# Patient Record
Sex: Male | Born: 1935 | Race: White | Hispanic: No | Marital: Married | Smoking: Former smoker
Health system: Southern US, Community
[De-identification: ages and names within clinical notes are randomized; demographics above are authoritative.]

## PROBLEM LIST (undated history)

## (undated) DIAGNOSIS — Z8719 Personal history of other diseases of the digestive system: Secondary | ICD-10-CM

## (undated) DIAGNOSIS — N189 Chronic kidney disease, unspecified: Secondary | ICD-10-CM

## (undated) DIAGNOSIS — E785 Hyperlipidemia, unspecified: Secondary | ICD-10-CM

## (undated) DIAGNOSIS — I10 Essential (primary) hypertension: Secondary | ICD-10-CM

## (undated) DIAGNOSIS — R609 Edema, unspecified: Secondary | ICD-10-CM

## (undated) DIAGNOSIS — I639 Cerebral infarction, unspecified: Secondary | ICD-10-CM

## (undated) DIAGNOSIS — H9190 Unspecified hearing loss, unspecified ear: Secondary | ICD-10-CM

## (undated) DIAGNOSIS — R011 Cardiac murmur, unspecified: Secondary | ICD-10-CM

## (undated) DIAGNOSIS — I219 Acute myocardial infarction, unspecified: Secondary | ICD-10-CM

## (undated) DIAGNOSIS — I5020 Unspecified systolic (congestive) heart failure: Secondary | ICD-10-CM

## (undated) DIAGNOSIS — F0280 Dementia in other diseases classified elsewhere without behavioral disturbance: Secondary | ICD-10-CM

## (undated) DIAGNOSIS — I2510 Atherosclerotic heart disease of native coronary artery without angina pectoris: Secondary | ICD-10-CM

## (undated) DIAGNOSIS — G459 Transient cerebral ischemic attack, unspecified: Secondary | ICD-10-CM

## (undated) DIAGNOSIS — H409 Unspecified glaucoma: Secondary | ICD-10-CM

## (undated) DIAGNOSIS — D649 Anemia, unspecified: Secondary | ICD-10-CM

## (undated) DIAGNOSIS — K219 Gastro-esophageal reflux disease without esophagitis: Secondary | ICD-10-CM

## (undated) DIAGNOSIS — I340 Nonrheumatic mitral (valve) insufficiency: Secondary | ICD-10-CM

## (undated) DIAGNOSIS — E119 Type 2 diabetes mellitus without complications: Secondary | ICD-10-CM

## (undated) HISTORY — DX: Transient cerebral ischemic attack, unspecified: G45.9

## (undated) HISTORY — DX: Atherosclerotic heart disease of native coronary artery without angina pectoris: I25.10

## (undated) HISTORY — DX: Hyperlipidemia, unspecified: E78.5

## (undated) HISTORY — DX: Chronic kidney disease, unspecified: N18.9

## (undated) HISTORY — DX: Gastro-esophageal reflux disease without esophagitis: K21.9

## (undated) HISTORY — DX: Nonrheumatic mitral (valve) insufficiency: I34.0

## (undated) HISTORY — DX: Cerebral infarction, unspecified: I63.9

## (undated) HISTORY — DX: Type 2 diabetes mellitus without complications: E11.9

## (undated) HISTORY — DX: Unspecified glaucoma: H40.9

## (undated) HISTORY — DX: Acute myocardial infarction, unspecified: I21.9

## (undated) HISTORY — DX: Essential (primary) hypertension: I10

## (undated) HISTORY — DX: Anemia, unspecified: D64.9

## (undated) HISTORY — PX: SKIN CANCER EXCISION: SHX779

---

## 2001-02-13 HISTORY — PX: CORONARY ARTERY BYPASS GRAFT: SHX141

## 2004-11-30 ENCOUNTER — Ambulatory Visit: Payer: Self-pay | Admitting: Cardiovascular Disease

## 2008-02-14 HISTORY — PX: JOINT REPLACEMENT: SHX530

## 2008-06-08 ENCOUNTER — Encounter: Payer: Self-pay | Admitting: Internal Medicine

## 2008-06-13 ENCOUNTER — Encounter: Payer: Self-pay | Admitting: Internal Medicine

## 2008-06-16 ENCOUNTER — Inpatient Hospital Stay: Payer: Self-pay | Admitting: Surgery

## 2009-02-13 DIAGNOSIS — I639 Cerebral infarction, unspecified: Secondary | ICD-10-CM

## 2009-02-13 HISTORY — DX: Cerebral infarction, unspecified: I63.9

## 2009-04-10 ENCOUNTER — Emergency Department: Payer: Self-pay | Admitting: Emergency Medicine

## 2011-02-17 DIAGNOSIS — I25810 Atherosclerosis of coronary artery bypass graft(s) without angina pectoris: Secondary | ICD-10-CM | POA: Diagnosis not present

## 2011-02-17 DIAGNOSIS — I10 Essential (primary) hypertension: Secondary | ICD-10-CM | POA: Diagnosis not present

## 2011-02-17 DIAGNOSIS — E119 Type 2 diabetes mellitus without complications: Secondary | ICD-10-CM | POA: Diagnosis not present

## 2011-02-17 DIAGNOSIS — I2510 Atherosclerotic heart disease of native coronary artery without angina pectoris: Secondary | ICD-10-CM | POA: Diagnosis not present

## 2011-02-21 DIAGNOSIS — E119 Type 2 diabetes mellitus without complications: Secondary | ICD-10-CM | POA: Diagnosis not present

## 2011-02-21 DIAGNOSIS — IMO0001 Reserved for inherently not codable concepts without codable children: Secondary | ICD-10-CM | POA: Diagnosis not present

## 2011-02-21 DIAGNOSIS — N184 Chronic kidney disease, stage 4 (severe): Secondary | ICD-10-CM | POA: Diagnosis not present

## 2011-02-21 DIAGNOSIS — D631 Anemia in chronic kidney disease: Secondary | ICD-10-CM | POA: Diagnosis not present

## 2011-02-21 DIAGNOSIS — N039 Chronic nephritic syndrome with unspecified morphologic changes: Secondary | ICD-10-CM | POA: Diagnosis not present

## 2011-02-21 DIAGNOSIS — I10 Essential (primary) hypertension: Secondary | ICD-10-CM | POA: Diagnosis not present

## 2011-03-27 DIAGNOSIS — I10 Essential (primary) hypertension: Secondary | ICD-10-CM | POA: Diagnosis not present

## 2011-03-27 DIAGNOSIS — E785 Hyperlipidemia, unspecified: Secondary | ICD-10-CM | POA: Diagnosis not present

## 2011-03-27 DIAGNOSIS — E119 Type 2 diabetes mellitus without complications: Secondary | ICD-10-CM | POA: Diagnosis not present

## 2011-04-04 DIAGNOSIS — B351 Tinea unguium: Secondary | ICD-10-CM | POA: Diagnosis not present

## 2011-04-04 DIAGNOSIS — M79609 Pain in unspecified limb: Secondary | ICD-10-CM | POA: Diagnosis not present

## 2011-04-24 DIAGNOSIS — H40009 Preglaucoma, unspecified, unspecified eye: Secondary | ICD-10-CM | POA: Diagnosis not present

## 2011-05-11 DIAGNOSIS — R509 Fever, unspecified: Secondary | ICD-10-CM | POA: Diagnosis not present

## 2011-05-11 DIAGNOSIS — J111 Influenza due to unidentified influenza virus with other respiratory manifestations: Secondary | ICD-10-CM | POA: Diagnosis not present

## 2011-06-15 DIAGNOSIS — I10 Essential (primary) hypertension: Secondary | ICD-10-CM | POA: Diagnosis not present

## 2011-06-15 DIAGNOSIS — IMO0001 Reserved for inherently not codable concepts without codable children: Secondary | ICD-10-CM | POA: Diagnosis not present

## 2011-06-15 DIAGNOSIS — E119 Type 2 diabetes mellitus without complications: Secondary | ICD-10-CM | POA: Diagnosis not present

## 2011-06-15 DIAGNOSIS — N184 Chronic kidney disease, stage 4 (severe): Secondary | ICD-10-CM | POA: Diagnosis not present

## 2011-06-20 DIAGNOSIS — N039 Chronic nephritic syndrome with unspecified morphologic changes: Secondary | ICD-10-CM | POA: Diagnosis not present

## 2011-06-20 DIAGNOSIS — I10 Essential (primary) hypertension: Secondary | ICD-10-CM | POA: Diagnosis not present

## 2011-06-20 DIAGNOSIS — D631 Anemia in chronic kidney disease: Secondary | ICD-10-CM | POA: Diagnosis not present

## 2011-06-20 DIAGNOSIS — R609 Edema, unspecified: Secondary | ICD-10-CM | POA: Diagnosis not present

## 2011-06-20 DIAGNOSIS — N184 Chronic kidney disease, stage 4 (severe): Secondary | ICD-10-CM | POA: Diagnosis not present

## 2011-07-11 DIAGNOSIS — I2510 Atherosclerotic heart disease of native coronary artery without angina pectoris: Secondary | ICD-10-CM | POA: Diagnosis not present

## 2011-07-11 DIAGNOSIS — I25810 Atherosclerosis of coronary artery bypass graft(s) without angina pectoris: Secondary | ICD-10-CM | POA: Diagnosis not present

## 2011-07-11 DIAGNOSIS — E785 Hyperlipidemia, unspecified: Secondary | ICD-10-CM | POA: Diagnosis not present

## 2011-07-11 DIAGNOSIS — E119 Type 2 diabetes mellitus without complications: Secondary | ICD-10-CM | POA: Diagnosis not present

## 2011-08-02 DIAGNOSIS — R072 Precordial pain: Secondary | ICD-10-CM | POA: Diagnosis not present

## 2011-08-03 DIAGNOSIS — E785 Hyperlipidemia, unspecified: Secondary | ICD-10-CM | POA: Diagnosis not present

## 2011-08-03 DIAGNOSIS — I10 Essential (primary) hypertension: Secondary | ICD-10-CM | POA: Diagnosis not present

## 2011-08-03 DIAGNOSIS — I25810 Atherosclerosis of coronary artery bypass graft(s) without angina pectoris: Secondary | ICD-10-CM | POA: Diagnosis not present

## 2011-08-03 DIAGNOSIS — I2510 Atherosclerotic heart disease of native coronary artery without angina pectoris: Secondary | ICD-10-CM | POA: Diagnosis not present

## 2011-09-28 DIAGNOSIS — E119 Type 2 diabetes mellitus without complications: Secondary | ICD-10-CM | POA: Diagnosis not present

## 2011-09-28 DIAGNOSIS — Z0000 Encounter for general adult medical examination without abnormal findings: Secondary | ICD-10-CM | POA: Diagnosis not present

## 2011-09-28 DIAGNOSIS — E785 Hyperlipidemia, unspecified: Secondary | ICD-10-CM | POA: Diagnosis not present

## 2011-09-28 DIAGNOSIS — N183 Chronic kidney disease, stage 3 unspecified: Secondary | ICD-10-CM | POA: Diagnosis not present

## 2011-09-28 DIAGNOSIS — I10 Essential (primary) hypertension: Secondary | ICD-10-CM | POA: Diagnosis not present

## 2011-09-28 DIAGNOSIS — Z125 Encounter for screening for malignant neoplasm of prostate: Secondary | ICD-10-CM | POA: Diagnosis not present

## 2011-10-02 DIAGNOSIS — G459 Transient cerebral ischemic attack, unspecified: Secondary | ICD-10-CM | POA: Diagnosis not present

## 2011-10-02 DIAGNOSIS — E1142 Type 2 diabetes mellitus with diabetic polyneuropathy: Secondary | ICD-10-CM | POA: Diagnosis not present

## 2011-10-02 DIAGNOSIS — E1149 Type 2 diabetes mellitus with other diabetic neurological complication: Secondary | ICD-10-CM | POA: Diagnosis not present

## 2011-10-18 DIAGNOSIS — I10 Essential (primary) hypertension: Secondary | ICD-10-CM | POA: Diagnosis not present

## 2011-10-18 DIAGNOSIS — R609 Edema, unspecified: Secondary | ICD-10-CM | POA: Diagnosis not present

## 2011-10-18 DIAGNOSIS — N184 Chronic kidney disease, stage 4 (severe): Secondary | ICD-10-CM | POA: Diagnosis not present

## 2011-10-18 DIAGNOSIS — E119 Type 2 diabetes mellitus without complications: Secondary | ICD-10-CM | POA: Diagnosis not present

## 2011-11-15 DIAGNOSIS — H40009 Preglaucoma, unspecified, unspecified eye: Secondary | ICD-10-CM | POA: Diagnosis not present

## 2011-12-28 DIAGNOSIS — Z23 Encounter for immunization: Secondary | ICD-10-CM | POA: Diagnosis not present

## 2011-12-28 DIAGNOSIS — E785 Hyperlipidemia, unspecified: Secondary | ICD-10-CM | POA: Diagnosis not present

## 2011-12-28 DIAGNOSIS — I10 Essential (primary) hypertension: Secondary | ICD-10-CM | POA: Diagnosis not present

## 2011-12-28 DIAGNOSIS — E119 Type 2 diabetes mellitus without complications: Secondary | ICD-10-CM | POA: Diagnosis not present

## 2012-01-04 DIAGNOSIS — I10 Essential (primary) hypertension: Secondary | ICD-10-CM | POA: Diagnosis not present

## 2012-01-04 DIAGNOSIS — I2510 Atherosclerotic heart disease of native coronary artery without angina pectoris: Secondary | ICD-10-CM | POA: Diagnosis not present

## 2012-01-04 DIAGNOSIS — I25810 Atherosclerosis of coronary artery bypass graft(s) without angina pectoris: Secondary | ICD-10-CM | POA: Diagnosis not present

## 2012-01-04 DIAGNOSIS — R609 Edema, unspecified: Secondary | ICD-10-CM | POA: Diagnosis not present

## 2012-02-26 DIAGNOSIS — M79609 Pain in unspecified limb: Secondary | ICD-10-CM | POA: Diagnosis not present

## 2012-02-26 DIAGNOSIS — B351 Tinea unguium: Secondary | ICD-10-CM | POA: Diagnosis not present

## 2012-03-25 DIAGNOSIS — E119 Type 2 diabetes mellitus without complications: Secondary | ICD-10-CM | POA: Diagnosis not present

## 2012-03-25 DIAGNOSIS — I10 Essential (primary) hypertension: Secondary | ICD-10-CM | POA: Diagnosis not present

## 2012-03-25 DIAGNOSIS — E785 Hyperlipidemia, unspecified: Secondary | ICD-10-CM | POA: Diagnosis not present

## 2012-04-15 DIAGNOSIS — N184 Chronic kidney disease, stage 4 (severe): Secondary | ICD-10-CM | POA: Diagnosis not present

## 2012-04-15 DIAGNOSIS — E119 Type 2 diabetes mellitus without complications: Secondary | ICD-10-CM | POA: Diagnosis not present

## 2012-04-17 DIAGNOSIS — N184 Chronic kidney disease, stage 4 (severe): Secondary | ICD-10-CM | POA: Diagnosis not present

## 2012-04-17 DIAGNOSIS — E119 Type 2 diabetes mellitus without complications: Secondary | ICD-10-CM | POA: Diagnosis not present

## 2012-04-17 DIAGNOSIS — I10 Essential (primary) hypertension: Secondary | ICD-10-CM | POA: Diagnosis not present

## 2012-04-17 DIAGNOSIS — R609 Edema, unspecified: Secondary | ICD-10-CM | POA: Diagnosis not present

## 2012-05-03 DIAGNOSIS — I25810 Atherosclerosis of coronary artery bypass graft(s) without angina pectoris: Secondary | ICD-10-CM | POA: Diagnosis not present

## 2012-05-03 DIAGNOSIS — E785 Hyperlipidemia, unspecified: Secondary | ICD-10-CM | POA: Diagnosis not present

## 2012-05-03 DIAGNOSIS — R079 Chest pain, unspecified: Secondary | ICD-10-CM | POA: Diagnosis not present

## 2012-05-03 DIAGNOSIS — I209 Angina pectoris, unspecified: Secondary | ICD-10-CM | POA: Diagnosis not present

## 2012-05-07 DIAGNOSIS — R072 Precordial pain: Secondary | ICD-10-CM | POA: Diagnosis not present

## 2012-06-17 DIAGNOSIS — H40009 Preglaucoma, unspecified, unspecified eye: Secondary | ICD-10-CM | POA: Diagnosis not present

## 2012-06-25 DIAGNOSIS — I10 Essential (primary) hypertension: Secondary | ICD-10-CM | POA: Diagnosis not present

## 2012-06-25 DIAGNOSIS — E785 Hyperlipidemia, unspecified: Secondary | ICD-10-CM | POA: Diagnosis not present

## 2012-06-25 DIAGNOSIS — E119 Type 2 diabetes mellitus without complications: Secondary | ICD-10-CM | POA: Diagnosis not present

## 2012-07-22 DIAGNOSIS — E119 Type 2 diabetes mellitus without complications: Secondary | ICD-10-CM | POA: Diagnosis not present

## 2012-07-22 DIAGNOSIS — N184 Chronic kidney disease, stage 4 (severe): Secondary | ICD-10-CM | POA: Diagnosis not present

## 2012-07-24 DIAGNOSIS — R609 Edema, unspecified: Secondary | ICD-10-CM | POA: Diagnosis not present

## 2012-07-24 DIAGNOSIS — N184 Chronic kidney disease, stage 4 (severe): Secondary | ICD-10-CM | POA: Diagnosis not present

## 2012-07-24 DIAGNOSIS — I10 Essential (primary) hypertension: Secondary | ICD-10-CM | POA: Diagnosis not present

## 2012-07-24 DIAGNOSIS — E119 Type 2 diabetes mellitus without complications: Secondary | ICD-10-CM | POA: Diagnosis not present

## 2012-07-25 DIAGNOSIS — E785 Hyperlipidemia, unspecified: Secondary | ICD-10-CM | POA: Diagnosis not present

## 2012-07-25 DIAGNOSIS — I10 Essential (primary) hypertension: Secondary | ICD-10-CM | POA: Diagnosis not present

## 2012-07-25 DIAGNOSIS — E119 Type 2 diabetes mellitus without complications: Secondary | ICD-10-CM | POA: Diagnosis not present

## 2012-09-06 DIAGNOSIS — I2510 Atherosclerotic heart disease of native coronary artery without angina pectoris: Secondary | ICD-10-CM | POA: Diagnosis not present

## 2012-09-06 DIAGNOSIS — E119 Type 2 diabetes mellitus without complications: Secondary | ICD-10-CM | POA: Diagnosis not present

## 2012-09-06 DIAGNOSIS — R609 Edema, unspecified: Secondary | ICD-10-CM | POA: Diagnosis not present

## 2012-09-06 DIAGNOSIS — I10 Essential (primary) hypertension: Secondary | ICD-10-CM | POA: Diagnosis not present

## 2012-10-03 DIAGNOSIS — Z7901 Long term (current) use of anticoagulants: Secondary | ICD-10-CM | POA: Diagnosis not present

## 2012-10-03 DIAGNOSIS — D649 Anemia, unspecified: Secondary | ICD-10-CM | POA: Diagnosis not present

## 2012-10-08 ENCOUNTER — Ambulatory Visit: Payer: Self-pay | Admitting: Cardiovascular Disease

## 2012-10-08 DIAGNOSIS — I10 Essential (primary) hypertension: Secondary | ICD-10-CM | POA: Diagnosis not present

## 2012-10-08 DIAGNOSIS — R609 Edema, unspecified: Secondary | ICD-10-CM | POA: Diagnosis not present

## 2012-10-08 DIAGNOSIS — R079 Chest pain, unspecified: Secondary | ICD-10-CM | POA: Diagnosis not present

## 2012-10-08 DIAGNOSIS — I742 Embolism and thrombosis of arteries of the upper extremities: Secondary | ICD-10-CM | POA: Diagnosis not present

## 2012-10-08 DIAGNOSIS — R0609 Other forms of dyspnea: Secondary | ICD-10-CM | POA: Diagnosis not present

## 2012-10-08 DIAGNOSIS — D151 Benign neoplasm of heart: Secondary | ICD-10-CM | POA: Diagnosis not present

## 2012-10-08 DIAGNOSIS — Z7902 Long term (current) use of antithrombotics/antiplatelets: Secondary | ICD-10-CM | POA: Diagnosis not present

## 2012-10-08 DIAGNOSIS — Z951 Presence of aortocoronary bypass graft: Secondary | ICD-10-CM | POA: Diagnosis not present

## 2012-10-08 DIAGNOSIS — Z79899 Other long term (current) drug therapy: Secondary | ICD-10-CM | POA: Diagnosis not present

## 2012-10-08 DIAGNOSIS — E119 Type 2 diabetes mellitus without complications: Secondary | ICD-10-CM | POA: Diagnosis not present

## 2012-10-08 DIAGNOSIS — I25810 Atherosclerosis of coronary artery bypass graft(s) without angina pectoris: Secondary | ICD-10-CM | POA: Diagnosis not present

## 2012-10-08 DIAGNOSIS — I209 Angina pectoris, unspecified: Secondary | ICD-10-CM | POA: Diagnosis not present

## 2012-10-08 DIAGNOSIS — E785 Hyperlipidemia, unspecified: Secondary | ICD-10-CM | POA: Diagnosis not present

## 2012-10-08 DIAGNOSIS — I080 Rheumatic disorders of both mitral and aortic valves: Secondary | ICD-10-CM | POA: Diagnosis not present

## 2012-10-08 DIAGNOSIS — R0989 Other specified symptoms and signs involving the circulatory and respiratory systems: Secondary | ICD-10-CM | POA: Diagnosis not present

## 2012-10-15 DIAGNOSIS — I10 Essential (primary) hypertension: Secondary | ICD-10-CM | POA: Diagnosis not present

## 2012-10-15 DIAGNOSIS — I25810 Atherosclerosis of coronary artery bypass graft(s) without angina pectoris: Secondary | ICD-10-CM | POA: Diagnosis not present

## 2012-10-15 DIAGNOSIS — I2510 Atherosclerotic heart disease of native coronary artery without angina pectoris: Secondary | ICD-10-CM | POA: Diagnosis not present

## 2012-10-15 DIAGNOSIS — R079 Chest pain, unspecified: Secondary | ICD-10-CM | POA: Diagnosis not present

## 2012-10-24 DIAGNOSIS — I10 Essential (primary) hypertension: Secondary | ICD-10-CM | POA: Diagnosis not present

## 2012-10-24 DIAGNOSIS — E785 Hyperlipidemia, unspecified: Secondary | ICD-10-CM | POA: Diagnosis not present

## 2012-10-24 DIAGNOSIS — Z125 Encounter for screening for malignant neoplasm of prostate: Secondary | ICD-10-CM | POA: Diagnosis not present

## 2012-10-24 DIAGNOSIS — N183 Chronic kidney disease, stage 3 unspecified: Secondary | ICD-10-CM | POA: Diagnosis not present

## 2012-10-24 DIAGNOSIS — Z23 Encounter for immunization: Secondary | ICD-10-CM | POA: Diagnosis not present

## 2012-10-24 DIAGNOSIS — N189 Chronic kidney disease, unspecified: Secondary | ICD-10-CM | POA: Diagnosis not present

## 2012-10-24 DIAGNOSIS — Z0000 Encounter for general adult medical examination without abnormal findings: Secondary | ICD-10-CM | POA: Diagnosis not present

## 2012-10-24 DIAGNOSIS — D631 Anemia in chronic kidney disease: Secondary | ICD-10-CM | POA: Diagnosis not present

## 2012-10-24 DIAGNOSIS — E119 Type 2 diabetes mellitus without complications: Secondary | ICD-10-CM | POA: Diagnosis not present

## 2012-11-11 DIAGNOSIS — I10 Essential (primary) hypertension: Secondary | ICD-10-CM | POA: Diagnosis not present

## 2012-11-11 DIAGNOSIS — N184 Chronic kidney disease, stage 4 (severe): Secondary | ICD-10-CM | POA: Diagnosis not present

## 2012-11-11 DIAGNOSIS — E118 Type 2 diabetes mellitus with unspecified complications: Secondary | ICD-10-CM | POA: Diagnosis not present

## 2012-11-11 DIAGNOSIS — R609 Edema, unspecified: Secondary | ICD-10-CM | POA: Diagnosis not present

## 2012-11-14 DIAGNOSIS — I10 Essential (primary) hypertension: Secondary | ICD-10-CM | POA: Diagnosis not present

## 2012-11-14 DIAGNOSIS — N184 Chronic kidney disease, stage 4 (severe): Secondary | ICD-10-CM | POA: Diagnosis not present

## 2012-11-14 DIAGNOSIS — R319 Hematuria, unspecified: Secondary | ICD-10-CM | POA: Diagnosis not present

## 2012-11-14 DIAGNOSIS — R609 Edema, unspecified: Secondary | ICD-10-CM | POA: Diagnosis not present

## 2012-11-14 DIAGNOSIS — IMO0001 Reserved for inherently not codable concepts without codable children: Secondary | ICD-10-CM | POA: Diagnosis not present

## 2012-11-14 DIAGNOSIS — E119 Type 2 diabetes mellitus without complications: Secondary | ICD-10-CM | POA: Diagnosis not present

## 2012-11-15 DIAGNOSIS — I2510 Atherosclerotic heart disease of native coronary artery without angina pectoris: Secondary | ICD-10-CM | POA: Diagnosis not present

## 2012-11-15 DIAGNOSIS — I25810 Atherosclerosis of coronary artery bypass graft(s) without angina pectoris: Secondary | ICD-10-CM | POA: Diagnosis not present

## 2012-11-15 DIAGNOSIS — I10 Essential (primary) hypertension: Secondary | ICD-10-CM | POA: Diagnosis not present

## 2012-11-25 DIAGNOSIS — G609 Hereditary and idiopathic neuropathy, unspecified: Secondary | ICD-10-CM | POA: Diagnosis not present

## 2012-12-16 DIAGNOSIS — H40009 Preglaucoma, unspecified, unspecified eye: Secondary | ICD-10-CM | POA: Diagnosis not present

## 2013-01-27 DIAGNOSIS — J019 Acute sinusitis, unspecified: Secondary | ICD-10-CM | POA: Diagnosis not present

## 2013-01-27 DIAGNOSIS — E7800 Pure hypercholesterolemia, unspecified: Secondary | ICD-10-CM | POA: Diagnosis not present

## 2013-01-27 DIAGNOSIS — E119 Type 2 diabetes mellitus without complications: Secondary | ICD-10-CM | POA: Diagnosis not present

## 2013-01-27 DIAGNOSIS — I10 Essential (primary) hypertension: Secondary | ICD-10-CM | POA: Diagnosis not present

## 2013-02-24 DIAGNOSIS — E785 Hyperlipidemia, unspecified: Secondary | ICD-10-CM | POA: Diagnosis not present

## 2013-02-24 DIAGNOSIS — I25810 Atherosclerosis of coronary artery bypass graft(s) without angina pectoris: Secondary | ICD-10-CM | POA: Diagnosis not present

## 2013-02-24 DIAGNOSIS — I2510 Atherosclerotic heart disease of native coronary artery without angina pectoris: Secondary | ICD-10-CM | POA: Diagnosis not present

## 2013-02-24 DIAGNOSIS — I10 Essential (primary) hypertension: Secondary | ICD-10-CM | POA: Diagnosis not present

## 2013-02-27 DIAGNOSIS — R072 Precordial pain: Secondary | ICD-10-CM | POA: Diagnosis not present

## 2013-03-03 DIAGNOSIS — I209 Angina pectoris, unspecified: Secondary | ICD-10-CM | POA: Diagnosis not present

## 2013-03-03 DIAGNOSIS — I2510 Atherosclerotic heart disease of native coronary artery without angina pectoris: Secondary | ICD-10-CM | POA: Diagnosis not present

## 2013-03-03 DIAGNOSIS — I10 Essential (primary) hypertension: Secondary | ICD-10-CM | POA: Diagnosis not present

## 2013-03-03 DIAGNOSIS — E785 Hyperlipidemia, unspecified: Secondary | ICD-10-CM | POA: Diagnosis not present

## 2013-03-03 DIAGNOSIS — I25810 Atherosclerosis of coronary artery bypass graft(s) without angina pectoris: Secondary | ICD-10-CM | POA: Diagnosis not present

## 2013-03-07 DIAGNOSIS — B351 Tinea unguium: Secondary | ICD-10-CM | POA: Diagnosis not present

## 2013-03-07 DIAGNOSIS — M79609 Pain in unspecified limb: Secondary | ICD-10-CM | POA: Diagnosis not present

## 2013-03-10 DIAGNOSIS — E118 Type 2 diabetes mellitus with unspecified complications: Secondary | ICD-10-CM | POA: Diagnosis not present

## 2013-03-10 DIAGNOSIS — R609 Edema, unspecified: Secondary | ICD-10-CM | POA: Diagnosis not present

## 2013-03-10 DIAGNOSIS — I10 Essential (primary) hypertension: Secondary | ICD-10-CM | POA: Diagnosis not present

## 2013-03-10 DIAGNOSIS — N184 Chronic kidney disease, stage 4 (severe): Secondary | ICD-10-CM | POA: Diagnosis not present

## 2013-03-11 DIAGNOSIS — L299 Pruritus, unspecified: Secondary | ICD-10-CM | POA: Diagnosis not present

## 2013-03-11 DIAGNOSIS — L280 Lichen simplex chronicus: Secondary | ICD-10-CM | POA: Diagnosis not present

## 2013-03-17 DIAGNOSIS — R609 Edema, unspecified: Secondary | ICD-10-CM | POA: Diagnosis not present

## 2013-03-17 DIAGNOSIS — N184 Chronic kidney disease, stage 4 (severe): Secondary | ICD-10-CM | POA: Diagnosis not present

## 2013-03-17 DIAGNOSIS — E118 Type 2 diabetes mellitus with unspecified complications: Secondary | ICD-10-CM | POA: Diagnosis not present

## 2013-03-17 DIAGNOSIS — I10 Essential (primary) hypertension: Secondary | ICD-10-CM | POA: Diagnosis not present

## 2013-04-30 DIAGNOSIS — I10 Essential (primary) hypertension: Secondary | ICD-10-CM | POA: Diagnosis not present

## 2013-04-30 DIAGNOSIS — E785 Hyperlipidemia, unspecified: Secondary | ICD-10-CM | POA: Diagnosis not present

## 2013-04-30 DIAGNOSIS — E119 Type 2 diabetes mellitus without complications: Secondary | ICD-10-CM | POA: Diagnosis not present

## 2013-06-16 DIAGNOSIS — H40009 Preglaucoma, unspecified, unspecified eye: Secondary | ICD-10-CM | POA: Diagnosis not present

## 2013-06-23 DIAGNOSIS — E118 Type 2 diabetes mellitus with unspecified complications: Secondary | ICD-10-CM | POA: Diagnosis not present

## 2013-06-23 DIAGNOSIS — N184 Chronic kidney disease, stage 4 (severe): Secondary | ICD-10-CM | POA: Diagnosis not present

## 2013-06-23 DIAGNOSIS — R609 Edema, unspecified: Secondary | ICD-10-CM | POA: Diagnosis not present

## 2013-06-23 DIAGNOSIS — I10 Essential (primary) hypertension: Secondary | ICD-10-CM | POA: Diagnosis not present

## 2013-06-25 DIAGNOSIS — I10 Essential (primary) hypertension: Secondary | ICD-10-CM | POA: Diagnosis not present

## 2013-06-25 DIAGNOSIS — R609 Edema, unspecified: Secondary | ICD-10-CM | POA: Diagnosis not present

## 2013-06-25 DIAGNOSIS — N184 Chronic kidney disease, stage 4 (severe): Secondary | ICD-10-CM | POA: Diagnosis not present

## 2013-06-25 DIAGNOSIS — E875 Hyperkalemia: Secondary | ICD-10-CM | POA: Diagnosis not present

## 2013-07-01 DIAGNOSIS — I25810 Atherosclerosis of coronary artery bypass graft(s) without angina pectoris: Secondary | ICD-10-CM | POA: Diagnosis not present

## 2013-07-01 DIAGNOSIS — I2510 Atherosclerotic heart disease of native coronary artery without angina pectoris: Secondary | ICD-10-CM | POA: Diagnosis not present

## 2013-07-01 DIAGNOSIS — E785 Hyperlipidemia, unspecified: Secondary | ICD-10-CM | POA: Diagnosis not present

## 2013-07-01 DIAGNOSIS — I10 Essential (primary) hypertension: Secondary | ICD-10-CM | POA: Diagnosis not present

## 2013-07-24 DIAGNOSIS — I200 Unstable angina: Secondary | ICD-10-CM | POA: Diagnosis not present

## 2013-07-24 DIAGNOSIS — I129 Hypertensive chronic kidney disease with stage 1 through stage 4 chronic kidney disease, or unspecified chronic kidney disease: Secondary | ICD-10-CM | POA: Diagnosis not present

## 2013-07-24 DIAGNOSIS — I214 Non-ST elevation (NSTEMI) myocardial infarction: Secondary | ICD-10-CM | POA: Diagnosis not present

## 2013-07-24 DIAGNOSIS — Z87891 Personal history of nicotine dependence: Secondary | ICD-10-CM | POA: Diagnosis not present

## 2013-07-24 DIAGNOSIS — I5031 Acute diastolic (congestive) heart failure: Secondary | ICD-10-CM | POA: Diagnosis present

## 2013-07-24 DIAGNOSIS — D649 Anemia, unspecified: Secondary | ICD-10-CM | POA: Diagnosis present

## 2013-07-24 DIAGNOSIS — I509 Heart failure, unspecified: Secondary | ICD-10-CM | POA: Diagnosis present

## 2013-07-24 DIAGNOSIS — I2510 Atherosclerotic heart disease of native coronary artery without angina pectoris: Secondary | ICD-10-CM | POA: Diagnosis not present

## 2013-07-24 DIAGNOSIS — Z951 Presence of aortocoronary bypass graft: Secondary | ICD-10-CM | POA: Diagnosis not present

## 2013-07-24 DIAGNOSIS — E119 Type 2 diabetes mellitus without complications: Secondary | ICD-10-CM | POA: Diagnosis present

## 2013-07-24 DIAGNOSIS — R079 Chest pain, unspecified: Secondary | ICD-10-CM | POA: Diagnosis not present

## 2013-07-24 DIAGNOSIS — N184 Chronic kidney disease, stage 4 (severe): Secondary | ICD-10-CM | POA: Diagnosis not present

## 2013-07-24 DIAGNOSIS — I25810 Atherosclerosis of coronary artery bypass graft(s) without angina pectoris: Secondary | ICD-10-CM | POA: Diagnosis not present

## 2013-07-24 DIAGNOSIS — R072 Precordial pain: Secondary | ICD-10-CM | POA: Diagnosis not present

## 2013-07-24 DIAGNOSIS — E785 Hyperlipidemia, unspecified: Secondary | ICD-10-CM | POA: Diagnosis present

## 2013-07-28 DIAGNOSIS — R0602 Shortness of breath: Secondary | ICD-10-CM | POA: Diagnosis not present

## 2013-07-28 DIAGNOSIS — I10 Essential (primary) hypertension: Secondary | ICD-10-CM | POA: Diagnosis not present

## 2013-07-28 DIAGNOSIS — I25810 Atherosclerosis of coronary artery bypass graft(s) without angina pectoris: Secondary | ICD-10-CM | POA: Diagnosis not present

## 2013-07-28 DIAGNOSIS — E785 Hyperlipidemia, unspecified: Secondary | ICD-10-CM | POA: Diagnosis not present

## 2013-07-28 DIAGNOSIS — I2510 Atherosclerotic heart disease of native coronary artery without angina pectoris: Secondary | ICD-10-CM | POA: Diagnosis not present

## 2013-07-30 DIAGNOSIS — I2510 Atherosclerotic heart disease of native coronary artery without angina pectoris: Secondary | ICD-10-CM | POA: Diagnosis not present

## 2013-07-30 DIAGNOSIS — N184 Chronic kidney disease, stage 4 (severe): Secondary | ICD-10-CM | POA: Diagnosis not present

## 2013-07-30 DIAGNOSIS — R609 Edema, unspecified: Secondary | ICD-10-CM | POA: Diagnosis not present

## 2013-07-30 DIAGNOSIS — I10 Essential (primary) hypertension: Secondary | ICD-10-CM | POA: Diagnosis not present

## 2013-07-30 DIAGNOSIS — R0602 Shortness of breath: Secondary | ICD-10-CM | POA: Diagnosis not present

## 2013-07-30 DIAGNOSIS — E118 Type 2 diabetes mellitus with unspecified complications: Secondary | ICD-10-CM | POA: Diagnosis not present

## 2013-07-31 DIAGNOSIS — E119 Type 2 diabetes mellitus without complications: Secondary | ICD-10-CM | POA: Diagnosis not present

## 2013-07-31 DIAGNOSIS — E7800 Pure hypercholesterolemia, unspecified: Secondary | ICD-10-CM | POA: Diagnosis not present

## 2013-07-31 DIAGNOSIS — I10 Essential (primary) hypertension: Secondary | ICD-10-CM | POA: Diagnosis not present

## 2013-08-22 DIAGNOSIS — I2510 Atherosclerotic heart disease of native coronary artery without angina pectoris: Secondary | ICD-10-CM | POA: Diagnosis not present

## 2013-08-22 DIAGNOSIS — I10 Essential (primary) hypertension: Secondary | ICD-10-CM | POA: Diagnosis not present

## 2013-08-22 DIAGNOSIS — E785 Hyperlipidemia, unspecified: Secondary | ICD-10-CM | POA: Diagnosis not present

## 2013-08-22 DIAGNOSIS — I059 Rheumatic mitral valve disease, unspecified: Secondary | ICD-10-CM | POA: Diagnosis not present

## 2013-08-22 DIAGNOSIS — I25810 Atherosclerosis of coronary artery bypass graft(s) without angina pectoris: Secondary | ICD-10-CM | POA: Diagnosis not present

## 2013-09-03 DIAGNOSIS — E119 Type 2 diabetes mellitus without complications: Secondary | ICD-10-CM | POA: Diagnosis not present

## 2013-09-03 DIAGNOSIS — E7800 Pure hypercholesterolemia, unspecified: Secondary | ICD-10-CM | POA: Diagnosis not present

## 2013-09-03 DIAGNOSIS — I10 Essential (primary) hypertension: Secondary | ICD-10-CM | POA: Diagnosis not present

## 2013-09-03 DIAGNOSIS — R234 Changes in skin texture: Secondary | ICD-10-CM | POA: Diagnosis not present

## 2013-09-09 DIAGNOSIS — R609 Edema, unspecified: Secondary | ICD-10-CM | POA: Diagnosis not present

## 2013-09-09 DIAGNOSIS — I10 Essential (primary) hypertension: Secondary | ICD-10-CM | POA: Diagnosis not present

## 2013-09-09 DIAGNOSIS — E118 Type 2 diabetes mellitus with unspecified complications: Secondary | ICD-10-CM | POA: Diagnosis not present

## 2013-09-09 DIAGNOSIS — N184 Chronic kidney disease, stage 4 (severe): Secondary | ICD-10-CM | POA: Diagnosis not present

## 2013-09-15 DIAGNOSIS — R609 Edema, unspecified: Secondary | ICD-10-CM | POA: Diagnosis not present

## 2013-09-15 DIAGNOSIS — E875 Hyperkalemia: Secondary | ICD-10-CM | POA: Diagnosis not present

## 2013-09-15 DIAGNOSIS — N184 Chronic kidney disease, stage 4 (severe): Secondary | ICD-10-CM | POA: Diagnosis not present

## 2013-09-15 DIAGNOSIS — I10 Essential (primary) hypertension: Secondary | ICD-10-CM | POA: Diagnosis not present

## 2013-10-23 DIAGNOSIS — I059 Rheumatic mitral valve disease, unspecified: Secondary | ICD-10-CM | POA: Diagnosis not present

## 2013-10-23 DIAGNOSIS — I10 Essential (primary) hypertension: Secondary | ICD-10-CM | POA: Diagnosis not present

## 2013-10-23 DIAGNOSIS — E785 Hyperlipidemia, unspecified: Secondary | ICD-10-CM | POA: Diagnosis not present

## 2013-10-23 DIAGNOSIS — I25810 Atherosclerosis of coronary artery bypass graft(s) without angina pectoris: Secondary | ICD-10-CM | POA: Diagnosis not present

## 2013-10-23 DIAGNOSIS — I2510 Atherosclerotic heart disease of native coronary artery without angina pectoris: Secondary | ICD-10-CM | POA: Diagnosis not present

## 2013-11-12 DIAGNOSIS — Z23 Encounter for immunization: Secondary | ICD-10-CM | POA: Diagnosis not present

## 2013-11-12 DIAGNOSIS — Z0000 Encounter for general adult medical examination without abnormal findings: Secondary | ICD-10-CM | POA: Diagnosis not present

## 2013-11-12 DIAGNOSIS — E119 Type 2 diabetes mellitus without complications: Secondary | ICD-10-CM | POA: Diagnosis not present

## 2013-11-12 DIAGNOSIS — D631 Anemia in chronic kidney disease: Secondary | ICD-10-CM | POA: Diagnosis not present

## 2013-11-12 DIAGNOSIS — I10 Essential (primary) hypertension: Secondary | ICD-10-CM | POA: Diagnosis not present

## 2013-11-12 DIAGNOSIS — N184 Chronic kidney disease, stage 4 (severe): Secondary | ICD-10-CM | POA: Diagnosis not present

## 2013-11-12 DIAGNOSIS — E785 Hyperlipidemia, unspecified: Secondary | ICD-10-CM | POA: Diagnosis not present

## 2013-11-12 DIAGNOSIS — N039 Chronic nephritic syndrome with unspecified morphologic changes: Secondary | ICD-10-CM | POA: Diagnosis not present

## 2013-11-14 DIAGNOSIS — I2510 Atherosclerotic heart disease of native coronary artery without angina pectoris: Secondary | ICD-10-CM | POA: Diagnosis not present

## 2013-11-14 DIAGNOSIS — W19XXXA Unspecified fall, initial encounter: Secondary | ICD-10-CM | POA: Diagnosis not present

## 2013-11-14 DIAGNOSIS — I10 Essential (primary) hypertension: Secondary | ICD-10-CM | POA: Diagnosis not present

## 2013-11-14 DIAGNOSIS — I340 Nonrheumatic mitral (valve) insufficiency: Secondary | ICD-10-CM | POA: Diagnosis not present

## 2013-11-14 DIAGNOSIS — E785 Hyperlipidemia, unspecified: Secondary | ICD-10-CM | POA: Diagnosis not present

## 2013-11-17 DIAGNOSIS — R55 Syncope and collapse: Secondary | ICD-10-CM | POA: Diagnosis not present

## 2013-11-18 DIAGNOSIS — W101XXA Fall (on)(from) sidewalk curb, initial encounter: Secondary | ICD-10-CM | POA: Diagnosis not present

## 2013-11-18 DIAGNOSIS — S065X0A Traumatic subdural hemorrhage without loss of consciousness, initial encounter: Secondary | ICD-10-CM | POA: Diagnosis not present

## 2013-11-18 DIAGNOSIS — I340 Nonrheumatic mitral (valve) insufficiency: Secondary | ICD-10-CM | POA: Diagnosis not present

## 2013-11-18 DIAGNOSIS — I2510 Atherosclerotic heart disease of native coronary artery without angina pectoris: Secondary | ICD-10-CM | POA: Diagnosis not present

## 2013-11-18 DIAGNOSIS — I10 Essential (primary) hypertension: Secondary | ICD-10-CM | POA: Diagnosis not present

## 2013-11-18 DIAGNOSIS — E785 Hyperlipidemia, unspecified: Secondary | ICD-10-CM | POA: Diagnosis not present

## 2013-11-18 DIAGNOSIS — I25810 Atherosclerosis of coronary artery bypass graft(s) without angina pectoris: Secondary | ICD-10-CM | POA: Diagnosis not present

## 2013-11-20 DIAGNOSIS — D72829 Elevated white blood cell count, unspecified: Secondary | ICD-10-CM | POA: Diagnosis not present

## 2013-11-25 DIAGNOSIS — Z8673 Personal history of transient ischemic attack (TIA), and cerebral infarction without residual deficits: Secondary | ICD-10-CM | POA: Diagnosis not present

## 2013-12-15 DIAGNOSIS — I25810 Atherosclerosis of coronary artery bypass graft(s) without angina pectoris: Secondary | ICD-10-CM | POA: Diagnosis not present

## 2013-12-15 DIAGNOSIS — I340 Nonrheumatic mitral (valve) insufficiency: Secondary | ICD-10-CM | POA: Diagnosis not present

## 2013-12-15 DIAGNOSIS — I2510 Atherosclerotic heart disease of native coronary artery without angina pectoris: Secondary | ICD-10-CM | POA: Diagnosis not present

## 2013-12-15 DIAGNOSIS — E785 Hyperlipidemia, unspecified: Secondary | ICD-10-CM | POA: Diagnosis not present

## 2013-12-15 DIAGNOSIS — I10 Essential (primary) hypertension: Secondary | ICD-10-CM | POA: Diagnosis not present

## 2013-12-17 DIAGNOSIS — H40002 Preglaucoma, unspecified, left eye: Secondary | ICD-10-CM | POA: Diagnosis not present

## 2014-01-02 DIAGNOSIS — N184 Chronic kidney disease, stage 4 (severe): Secondary | ICD-10-CM | POA: Diagnosis not present

## 2014-01-02 DIAGNOSIS — R600 Localized edema: Secondary | ICD-10-CM | POA: Diagnosis not present

## 2014-01-02 DIAGNOSIS — I10 Essential (primary) hypertension: Secondary | ICD-10-CM | POA: Diagnosis not present

## 2014-01-02 DIAGNOSIS — E118 Type 2 diabetes mellitus with unspecified complications: Secondary | ICD-10-CM | POA: Diagnosis not present

## 2014-01-14 DIAGNOSIS — E875 Hyperkalemia: Secondary | ICD-10-CM | POA: Diagnosis not present

## 2014-01-14 DIAGNOSIS — I10 Essential (primary) hypertension: Secondary | ICD-10-CM | POA: Diagnosis not present

## 2014-01-14 DIAGNOSIS — N184 Chronic kidney disease, stage 4 (severe): Secondary | ICD-10-CM | POA: Diagnosis not present

## 2014-01-14 DIAGNOSIS — R601 Generalized edema: Secondary | ICD-10-CM | POA: Diagnosis not present

## 2014-02-25 DIAGNOSIS — M79675 Pain in left toe(s): Secondary | ICD-10-CM | POA: Diagnosis not present

## 2014-02-25 DIAGNOSIS — E119 Type 2 diabetes mellitus without complications: Secondary | ICD-10-CM | POA: Diagnosis not present

## 2014-02-25 DIAGNOSIS — B351 Tinea unguium: Secondary | ICD-10-CM | POA: Diagnosis not present

## 2014-02-25 DIAGNOSIS — M79674 Pain in right toe(s): Secondary | ICD-10-CM | POA: Diagnosis not present

## 2014-02-26 DIAGNOSIS — D631 Anemia in chronic kidney disease: Secondary | ICD-10-CM | POA: Diagnosis not present

## 2014-02-26 DIAGNOSIS — E785 Hyperlipidemia, unspecified: Secondary | ICD-10-CM | POA: Diagnosis not present

## 2014-02-26 DIAGNOSIS — I10 Essential (primary) hypertension: Secondary | ICD-10-CM | POA: Diagnosis not present

## 2014-02-26 DIAGNOSIS — E119 Type 2 diabetes mellitus without complications: Secondary | ICD-10-CM | POA: Diagnosis not present

## 2014-02-26 DIAGNOSIS — N184 Chronic kidney disease, stage 4 (severe): Secondary | ICD-10-CM | POA: Diagnosis not present

## 2014-02-26 DIAGNOSIS — I2510 Atherosclerotic heart disease of native coronary artery without angina pectoris: Secondary | ICD-10-CM | POA: Diagnosis not present

## 2014-02-26 DIAGNOSIS — I1310 Hypertensive heart and chronic kidney disease without heart failure, with stage 1 through stage 4 chronic kidney disease, or unspecified chronic kidney disease: Secondary | ICD-10-CM | POA: Diagnosis not present

## 2014-02-26 DIAGNOSIS — I340 Nonrheumatic mitral (valve) insufficiency: Secondary | ICD-10-CM | POA: Diagnosis not present

## 2014-03-05 DIAGNOSIS — S2239XA Fracture of one rib, unspecified side, initial encounter for closed fracture: Secondary | ICD-10-CM | POA: Diagnosis not present

## 2014-03-05 DIAGNOSIS — I340 Nonrheumatic mitral (valve) insufficiency: Secondary | ICD-10-CM | POA: Diagnosis not present

## 2014-03-05 DIAGNOSIS — I2510 Atherosclerotic heart disease of native coronary artery without angina pectoris: Secondary | ICD-10-CM | POA: Diagnosis not present

## 2014-03-05 DIAGNOSIS — I10 Essential (primary) hypertension: Secondary | ICD-10-CM | POA: Diagnosis not present

## 2014-03-05 DIAGNOSIS — I25810 Atherosclerosis of coronary artery bypass graft(s) without angina pectoris: Secondary | ICD-10-CM | POA: Diagnosis not present

## 2014-03-05 DIAGNOSIS — E785 Hyperlipidemia, unspecified: Secondary | ICD-10-CM | POA: Diagnosis not present

## 2014-04-06 DIAGNOSIS — I340 Nonrheumatic mitral (valve) insufficiency: Secondary | ICD-10-CM | POA: Diagnosis not present

## 2014-04-06 DIAGNOSIS — I10 Essential (primary) hypertension: Secondary | ICD-10-CM | POA: Diagnosis not present

## 2014-04-06 DIAGNOSIS — I25810 Atherosclerosis of coronary artery bypass graft(s) without angina pectoris: Secondary | ICD-10-CM | POA: Diagnosis not present

## 2014-04-06 DIAGNOSIS — E785 Hyperlipidemia, unspecified: Secondary | ICD-10-CM | POA: Diagnosis not present

## 2014-04-06 DIAGNOSIS — I2510 Atherosclerotic heart disease of native coronary artery without angina pectoris: Secondary | ICD-10-CM | POA: Diagnosis not present

## 2014-04-14 DIAGNOSIS — R600 Localized edema: Secondary | ICD-10-CM | POA: Diagnosis not present

## 2014-04-14 DIAGNOSIS — E118 Type 2 diabetes mellitus with unspecified complications: Secondary | ICD-10-CM | POA: Diagnosis not present

## 2014-04-14 DIAGNOSIS — I10 Essential (primary) hypertension: Secondary | ICD-10-CM | POA: Diagnosis not present

## 2014-04-14 DIAGNOSIS — N184 Chronic kidney disease, stage 4 (severe): Secondary | ICD-10-CM | POA: Diagnosis not present

## 2014-04-15 DIAGNOSIS — H8111 Benign paroxysmal vertigo, right ear: Secondary | ICD-10-CM | POA: Diagnosis not present

## 2014-04-15 DIAGNOSIS — H8109 Meniere's disease, unspecified ear: Secondary | ICD-10-CM | POA: Diagnosis not present

## 2014-04-15 DIAGNOSIS — H903 Sensorineural hearing loss, bilateral: Secondary | ICD-10-CM | POA: Diagnosis not present

## 2014-04-15 DIAGNOSIS — H8113 Benign paroxysmal vertigo, bilateral: Secondary | ICD-10-CM | POA: Diagnosis not present

## 2014-04-20 DIAGNOSIS — I10 Essential (primary) hypertension: Secondary | ICD-10-CM | POA: Diagnosis not present

## 2014-04-20 DIAGNOSIS — E1122 Type 2 diabetes mellitus with diabetic chronic kidney disease: Secondary | ICD-10-CM | POA: Diagnosis not present

## 2014-04-20 DIAGNOSIS — N184 Chronic kidney disease, stage 4 (severe): Secondary | ICD-10-CM | POA: Diagnosis not present

## 2014-04-22 DIAGNOSIS — H8110 Benign paroxysmal vertigo, unspecified ear: Secondary | ICD-10-CM | POA: Diagnosis not present

## 2014-04-22 DIAGNOSIS — H8109 Meniere's disease, unspecified ear: Secondary | ICD-10-CM | POA: Diagnosis not present

## 2014-04-22 DIAGNOSIS — H8113 Benign paroxysmal vertigo, bilateral: Secondary | ICD-10-CM | POA: Diagnosis not present

## 2014-04-22 DIAGNOSIS — H903 Sensorineural hearing loss, bilateral: Secondary | ICD-10-CM | POA: Diagnosis not present

## 2014-05-28 DIAGNOSIS — E119 Type 2 diabetes mellitus without complications: Secondary | ICD-10-CM | POA: Diagnosis not present

## 2014-05-28 DIAGNOSIS — E785 Hyperlipidemia, unspecified: Secondary | ICD-10-CM | POA: Diagnosis not present

## 2014-05-28 DIAGNOSIS — I10 Essential (primary) hypertension: Secondary | ICD-10-CM | POA: Diagnosis not present

## 2014-06-04 DIAGNOSIS — E785 Hyperlipidemia, unspecified: Secondary | ICD-10-CM | POA: Diagnosis not present

## 2014-06-04 DIAGNOSIS — N184 Chronic kidney disease, stage 4 (severe): Secondary | ICD-10-CM | POA: Diagnosis not present

## 2014-06-04 DIAGNOSIS — I10 Essential (primary) hypertension: Secondary | ICD-10-CM | POA: Diagnosis not present

## 2014-06-04 DIAGNOSIS — E119 Type 2 diabetes mellitus without complications: Secondary | ICD-10-CM | POA: Diagnosis not present

## 2014-06-15 DIAGNOSIS — H40002 Preglaucoma, unspecified, left eye: Secondary | ICD-10-CM | POA: Diagnosis not present

## 2014-06-17 DIAGNOSIS — I10 Essential (primary) hypertension: Secondary | ICD-10-CM | POA: Diagnosis not present

## 2014-06-17 DIAGNOSIS — N184 Chronic kidney disease, stage 4 (severe): Secondary | ICD-10-CM | POA: Diagnosis not present

## 2014-06-24 DIAGNOSIS — H40002 Preglaucoma, unspecified, left eye: Secondary | ICD-10-CM | POA: Diagnosis not present

## 2014-07-27 ENCOUNTER — Telehealth: Payer: Self-pay | Admitting: Family Medicine

## 2014-07-27 NOTE — Telephone Encounter (Signed)
Refill request from [facility], for 90 day supply Januvia 25mg  dialy.

## 2014-07-27 NOTE — Telephone Encounter (Deleted)
E-Fax

## 2014-07-28 MED ORDER — JANUVIA 25 MG PO TABS: 25.0000 mg | ORAL_TABLET | Freq: Every day | ORAL | Status: DC

## 2014-08-10 DIAGNOSIS — I10 Essential (primary) hypertension: Secondary | ICD-10-CM | POA: Diagnosis not present

## 2014-08-10 DIAGNOSIS — I340 Nonrheumatic mitral (valve) insufficiency: Secondary | ICD-10-CM | POA: Diagnosis not present

## 2014-08-10 DIAGNOSIS — I2510 Atherosclerotic heart disease of native coronary artery without angina pectoris: Secondary | ICD-10-CM | POA: Diagnosis not present

## 2014-08-10 DIAGNOSIS — E785 Hyperlipidemia, unspecified: Secondary | ICD-10-CM | POA: Diagnosis not present

## 2014-08-10 DIAGNOSIS — I25810 Atherosclerosis of coronary artery bypass graft(s) without angina pectoris: Secondary | ICD-10-CM | POA: Diagnosis not present

## 2014-08-11 DIAGNOSIS — N184 Chronic kidney disease, stage 4 (severe): Secondary | ICD-10-CM | POA: Diagnosis not present

## 2014-08-11 DIAGNOSIS — E1122 Type 2 diabetes mellitus with diabetic chronic kidney disease: Secondary | ICD-10-CM | POA: Diagnosis not present

## 2014-08-11 DIAGNOSIS — I10 Essential (primary) hypertension: Secondary | ICD-10-CM | POA: Diagnosis not present

## 2014-08-11 DIAGNOSIS — R600 Localized edema: Secondary | ICD-10-CM | POA: Diagnosis not present

## 2014-08-19 DIAGNOSIS — N184 Chronic kidney disease, stage 4 (severe): Secondary | ICD-10-CM | POA: Diagnosis not present

## 2014-08-19 DIAGNOSIS — E1122 Type 2 diabetes mellitus with diabetic chronic kidney disease: Secondary | ICD-10-CM | POA: Diagnosis not present

## 2014-08-19 DIAGNOSIS — I10 Essential (primary) hypertension: Secondary | ICD-10-CM | POA: Diagnosis not present

## 2014-08-19 DIAGNOSIS — R601 Generalized edema: Secondary | ICD-10-CM | POA: Diagnosis not present

## 2014-09-03 ENCOUNTER — Encounter: Payer: Self-pay | Admitting: Family Medicine

## 2014-09-03 ENCOUNTER — Ambulatory Visit (INDEPENDENT_AMBULATORY_CARE_PROVIDER_SITE_OTHER): Payer: Medicare Other | Admitting: Family Medicine

## 2014-09-03 DIAGNOSIS — N184 Chronic kidney disease, stage 4 (severe): Secondary | ICD-10-CM | POA: Diagnosis not present

## 2014-09-03 DIAGNOSIS — I1310 Hypertensive heart and chronic kidney disease without heart failure, with stage 1 through stage 4 chronic kidney disease, or unspecified chronic kidney disease: Secondary | ICD-10-CM | POA: Diagnosis not present

## 2014-09-03 DIAGNOSIS — E1322 Other specified diabetes mellitus with diabetic chronic kidney disease: Secondary | ICD-10-CM

## 2014-09-03 DIAGNOSIS — E119 Type 2 diabetes mellitus without complications: Secondary | ICD-10-CM | POA: Insufficient documentation

## 2014-09-03 DIAGNOSIS — E1122 Type 2 diabetes mellitus with diabetic chronic kidney disease: Secondary | ICD-10-CM

## 2014-09-03 LAB — BAYER DCA HB A1C WAIVED: HB A1C (BAYER DCA - WAIVED): 7 % — ABNORMAL HIGH (ref <7.0)

## 2014-09-03 MED ORDER — JANUVIA 25 MG PO TABS: 25.0000 mg | ORAL_TABLET | Freq: Every day | ORAL | Status: DC

## 2014-09-03 NOTE — Assessment & Plan Note (Signed)
Patient's blood pressure and renal status and been stable managed by both cardiology and nephrology

## 2014-09-03 NOTE — Assessment & Plan Note (Signed)
Manage by nephrology

## 2014-09-03 NOTE — Progress Notes (Signed)
   BP 133/70 mmHg  Pulse 58  Temp(Src) 97.6 F (36.4 C)  Ht 5' 5.5" (1.664 m)  Wt 169 lb (76.658 kg)  BMI 27.69 kg/m2  SpO2 95%   Subjective:    Patient ID: Ray Lamb, male    DOB: 11-10-1930, 81 y.o.   MRN: [id number]  HPI: Ray Lamb is a 81 y.o. male  Chief Complaint  Patient presents with  . Diabetes   doing well with no global blood sugar spells good glucose control no side effects from medications which he takes every day New medication of Januvia has done well with no side effects from last visit Blood pressure remains in good control with no side effects from medications Overrated cholesterol with no side effects from medications Good long-term control  Relevant past medical, surgical, family and social history reviewed and updated as indicated. Interim medical history since our last visit reviewed. Allergies and medications reviewed and updated.  Review of Systems  Per HPI unless specifically indicated above     Objective:    BP 133/70 mmHg  Pulse 58  Temp(Src) 97.6 F (36.4 C)  Ht 5' 5.5" (1.664 m)  Wt 169 lb (76.658 kg)  BMI 27.69 kg/m2  SpO2 95%  Wt Readings from Last 3 Encounters:  09/03/14 169 lb (76.658 kg)  06/04/14 173 lb (78.472 kg)    Physical Exam  No results found for this or any previous visit.    Assessment & Plan:   Problem List Items Addressed This Visit      Cardiovascular and Mediastinum   Hypertensive heart and renal disease    Patient's blood pressure and renal status and been stable managed by both cardiology and nephrology        Endocrine   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 disease    Manage by nephrology      Relevant Medications   JANUVIA 25 MG tablet   Other Relevant Orders   Bayer [place] Hb A1c Waived   Bayer [place] Hb A1c Waived       Follow up plan: Return in about 3 months (around 12/04/2014) for Physical Exam.

## 2014-09-14 ENCOUNTER — Telehealth: Payer: Self-pay

## 2014-09-14 NOTE — Telephone Encounter (Signed)
Patient wanted Januvia Rx sent to [facility], not locally. I called Rx in.  Also asked me to watch for request from [place] for Meter and supplies

## 2014-11-12 DIAGNOSIS — B351 Tinea unguium: Secondary | ICD-10-CM | POA: Diagnosis not present

## 2014-11-12 DIAGNOSIS — M79675 Pain in left toe(s): Secondary | ICD-10-CM | POA: Diagnosis not present

## 2014-11-12 DIAGNOSIS — M79674 Pain in right toe(s): Secondary | ICD-10-CM | POA: Diagnosis not present

## 2014-11-25 DIAGNOSIS — R410 Disorientation, unspecified: Secondary | ICD-10-CM | POA: Diagnosis not present

## 2014-12-03 ENCOUNTER — Ambulatory Visit (INDEPENDENT_AMBULATORY_CARE_PROVIDER_SITE_OTHER): Payer: Medicare Other | Admitting: Family Medicine

## 2014-12-03 ENCOUNTER — Encounter: Payer: Self-pay | Admitting: Family Medicine

## 2014-12-03 VITALS — BP 138/62 | Ht 65.0 in | Wt 170.0 lb

## 2014-12-03 DIAGNOSIS — Z0000 Encounter for general adult medical examination without abnormal findings: Secondary | ICD-10-CM | POA: Diagnosis not present

## 2014-12-03 DIAGNOSIS — N184 Chronic kidney disease, stage 4 (severe): Secondary | ICD-10-CM

## 2014-12-03 DIAGNOSIS — E1122 Type 2 diabetes mellitus with diabetic chronic kidney disease: Secondary | ICD-10-CM | POA: Diagnosis not present

## 2014-12-03 DIAGNOSIS — E785 Hyperlipidemia, unspecified: Secondary | ICD-10-CM | POA: Diagnosis not present

## 2014-12-03 DIAGNOSIS — D692 Other nonthrombocytopenic purpura: Secondary | ICD-10-CM | POA: Insufficient documentation

## 2014-12-03 DIAGNOSIS — Z23 Encounter for immunization: Secondary | ICD-10-CM

## 2014-12-03 DIAGNOSIS — I1310 Hypertensive heart and chronic kidney disease without heart failure, with stage 1 through stage 4 chronic kidney disease, or unspecified chronic kidney disease: Secondary | ICD-10-CM | POA: Diagnosis not present

## 2014-12-03 LAB — URINALYSIS, ROUTINE W REFLEX MICROSCOPIC
BILIRUBIN UA: NEGATIVE
GLUCOSE, UA: NEGATIVE
KETONES UA: NEGATIVE
Leukocytes, UA: NEGATIVE
NITRITE UA: NEGATIVE
SPEC GRAV UA: 1.02 (ref 1.005–1.030)
UUROB: 0.2 mg/dL (ref 0.2–1.0)
pH, UA: 5.5 (ref 5.0–7.5)

## 2014-12-03 LAB — MICROSCOPIC EXAMINATION
Bacteria, UA: NONE SEEN
CAST TYPE: NONE SEEN
CASTS: NONE SEEN /LPF
CRYSTAL TYPE: NONE SEEN
CRYSTALS: NONE SEEN
MUCUS UA: NONE SEEN
RBC MICROSCOPIC, UA: NONE SEEN /HPF (ref 0–?)
Renal Epithel, UA: NONE SEEN /hpf
Trichomonas, UA: NONE SEEN
Yeast, UA: NONE SEEN

## 2014-12-03 LAB — BAYER DCA HB A1C WAIVED: HB A1C (BAYER DCA - WAIVED): 7.5 % — ABNORMAL HIGH (ref <7.0)

## 2014-12-03 MED ORDER — EZETIMIBE 10 MG PO TABS: 10.0000 mg | ORAL_TABLET | Freq: Every day | ORAL | Status: DC

## 2014-12-03 MED ORDER — JANUVIA 25 MG PO TABS: 25.0000 mg | ORAL_TABLET | Freq: Every day | ORAL | Status: DC

## 2014-12-03 MED ORDER — CLOPIDOGREL BISULFATE 75 MG PO TABS: 75.0000 mg | ORAL_TABLET | Freq: Every day | ORAL | Status: DC

## 2014-12-03 MED ORDER — ATORVASTATIN CALCIUM 80 MG PO TABS: 80.0000 mg | ORAL_TABLET | Freq: Every day | ORAL | Status: DC

## 2014-12-03 MED ORDER — AMLODIPINE BESYLATE 5 MG PO TABS: 5.0000 mg | ORAL_TABLET | Freq: Every day | ORAL | Status: DC

## 2014-12-03 MED ORDER — METOPROLOL SUCCINATE ER 25 MG PO TB24: 25.0000 mg | ORAL_TABLET | Freq: Every day | ORAL | Status: DC

## 2014-12-03 MED ORDER — GLIPIZIDE 5 MG PO TABS: 5.0000 mg | ORAL_TABLET | Freq: Every day | ORAL | Status: DC

## 2014-12-03 NOTE — Assessment & Plan Note (Signed)
The current medical regimen is effective;  continue present plan and medications.  

## 2014-12-03 NOTE — Progress Notes (Signed)
BP 138/62 mmHg  Ht 5\' 5"  (1.651 m)  Wt 170 lb (77.111 kg)  BMI 28.29 kg/m2   Subjective:    Patient ID: Ray Lamb, male    DOB: 11/16/1930, 81 y.o.   MRN: [id number]  HPI: Ray Lamb is a 81 y.o. male  Chief Complaint  Patient presents with  . Annual Exam   patient also recheck blood pressure doing well no complaints from medications. Some questions about diabetes as blood sugars in the upper 100s in the morning fasting during the day they do okay has no hypoglycemic spells no side effects from medications Takes cholesterol medicines without problems sees his eye doctor for glaucoma Just got hearing aids  Relevant past medical, surgical, family and social history reviewed and updated as indicated. Interim medical history since our last visit reviewed. Allergies and medications reviewed and updated.  Review of Systems  Constitutional: Negative.   HENT: Negative.   Eyes: Negative.   Respiratory: Negative.   Cardiovascular: Negative.   Gastrointestinal: Negative.   Endocrine: Negative.   Genitourinary: Negative.   Musculoskeletal: Negative.   Skin: Negative.   Allergic/Immunologic: Negative.   Neurological: Negative.   Hematological: Negative.   Psychiatric/Behavioral: Negative.     Per HPI unless specifically indicated above     Objective:    BP 138/62 mmHg  Ht 5\' 5"  (1.651 m)  Wt 170 lb (77.111 kg)  BMI 28.29 kg/m2  Wt Readings from Last 3 Encounters:  12/03/14 170 lb (77.111 kg)  09/03/14 169 lb (76.658 kg)  06/04/14 173 lb (78.472 kg)    Physical Exam  Constitutional: He is oriented to person, place, and time. He appears well-developed and well-nourished.  HENT:  Head: Normocephalic and atraumatic.  Right Ear: External ear normal.  Left Ear: External ear normal.  Eyes: Conjunctivae and EOM are normal. Pupils are equal, round, and reactive to light.  Neck: Normal range of motion. Neck supple.  Cardiovascular: Normal rate, regular rhythm, normal  heart sounds and intact distal pulses.   Pulmonary/Chest: Effort normal and breath sounds normal.  Abdominal: Soft. Bowel sounds are normal. There is no splenomegaly or hepatomegaly.  Genitourinary: Rectum normal and penis normal.  Prostate enlarged  Musculoskeletal: Normal range of motion.  Neurological: He is alert and oriented to person, place, and time. He has normal reflexes.  Skin: No rash noted. No erythema.  Psychiatric: He has a normal mood and affect. His behavior is normal. Judgment and thought content normal.    Results for orders placed or performed in visit on 09/03/14  Bayer DCA Hb A1c Waived  Result Value Ref Range   Bayer DCA Hb A1c Waived 7.0 (H) <7.0 %      Assessment & Plan:   Problem List Items Addressed This Visit      Cardiovascular and Mediastinum   Hypertensive heart and renal disease    The current medical regimen is effective;  continue present plan and medications.       Relevant Medications   amLODipine (NORVASC) 5 MG tablet   atorvastatin (LIPITOR) 80 MG tablet   clopidogrel (PLAVIX) 75 MG tablet   ezetimibe (ZETIA) 10 MG tablet   metoprolol succinate (TOPROL-XL) 25 MG 24 hr tablet   Other Relevant Orders   Comprehensive metabolic panel   CBC with Differential/Platelet   TSH   Urinalysis, Routine w reflex microscopic (not at [facility])   Senile purpura ([place])    Discussed diet and care      Relevant Medications  amLODipine (NORVASC) 5 MG tablet   atorvastatin (LIPITOR) 80 MG tablet   ezetimibe (ZETIA) 10 MG tablet   metoprolol succinate (TOPROL-XL) 25 MG 24 hr tablet   Other Relevant Orders   Comprehensive metabolic panel   CBC with Differential/Platelet     Endocrine   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 disease (HCC)    The current medical regimen is effective;  continue present plan and medications.       Relevant Medications   atorvastatin (LIPITOR) 80 MG tablet   glipiZIDE (GLUCOTROL) 5 MG tablet   JANUVIA 25 MG  tablet   Other Relevant Orders   Comprehensive metabolic panel   CBC with Differential/Platelet   TSH   Bayer DCA Hb A1c Waived     Other   Hyperlipidemia    The current medical regimen is effective;  continue present plan and medications.       Relevant Medications   amLODipine (NORVASC) 5 MG tablet   atorvastatin (LIPITOR) 80 MG tablet   ezetimibe (ZETIA) 10 MG tablet   metoprolol succinate (TOPROL-XL) 25 MG 24 hr tablet   Other Relevant Orders   Comprehensive metabolic panel   Lipid panel    Other Visit Diagnoses    Immunization due    -  Primary    Relevant Orders    Flu Vaccine QUAD 36+ mos PF IM (Fluarix & Fluzone Quad PF) (Completed)    PE (physical exam), annual            Follow up plan: Return in about 3 months (around 03/05/2015), or if symptoms worsen or fail to improve, for recheck and A1C.

## 2014-12-03 NOTE — Assessment & Plan Note (Signed)
Discussed diet and care

## 2014-12-04 LAB — CBC WITH DIFFERENTIAL/PLATELET
BASOS ABS: 0.1 10*3/uL (ref 0.0–0.2)
Basos: 1 %
EOS (ABSOLUTE): 0.3 10*3/uL (ref 0.0–0.4)
Eos: 3 %
Hematocrit: 33.6 % — ABNORMAL LOW (ref 37.5–51.0)
Hemoglobin: 10.9 g/dL — ABNORMAL LOW (ref 12.6–17.7)
IMMATURE GRANULOCYTES: 0 %
Immature Grans (Abs): 0 10*3/uL (ref 0.0–0.1)
LYMPHS ABS: 3.9 10*3/uL — AB (ref 0.7–3.1)
LYMPHS: 38 %
MCH: 31.1 pg (ref 26.6–33.0)
MCHC: 32.4 g/dL (ref 31.5–35.7)
MCV: 96 fL (ref 79–97)
MONOCYTES: 10 %
MONOS ABS: 1 10*3/uL — AB (ref 0.1–0.9)
NEUTROS PCT: 48 %
Neutrophils Absolute: 5 10*3/uL (ref 1.4–7.0)
PLATELETS: 357 10*3/uL (ref 150–379)
RBC: 3.51 x10E6/uL — AB (ref 4.14–5.80)
RDW: 13.3 % (ref 12.3–15.4)
WBC: 10.2 10*3/uL (ref 3.4–10.8)

## 2014-12-04 LAB — COMPREHENSIVE METABOLIC PANEL
A/G RATIO: 1.3 (ref 1.1–2.5)
ALBUMIN: 4 g/dL (ref 3.5–4.7)
ALT: 15 IU/L (ref 0–44)
AST: 20 IU/L (ref 0–40)
Alkaline Phosphatase: 69 IU/L (ref 39–117)
BILIRUBIN TOTAL: 0.4 mg/dL (ref 0.0–1.2)
BUN / CREAT RATIO: 15 (ref 10–22)
BUN: 32 mg/dL — AB (ref 8–27)
CO2: 25 mmol/L (ref 18–29)
Calcium: 9.9 mg/dL (ref 8.6–10.2)
Chloride: 98 mmol/L (ref 97–106)
Creatinine, Ser: 2.11 mg/dL — ABNORMAL HIGH (ref 0.76–1.27)
GFR calc non Af Amer: 28 mL/min/{1.73_m2} — ABNORMAL LOW (ref >59)
GFR, EST AFRICAN AMERICAN: 33 mL/min/{1.73_m2} — AB (ref >59)
Globulin, Total: 3.1 g/dL (ref 1.5–4.5)
Glucose: 173 mg/dL — ABNORMAL HIGH (ref 65–99)
POTASSIUM: 5.2 mmol/L (ref 3.5–5.2)
Sodium: 136 mmol/L (ref 136–144)
TOTAL PROTEIN: 7.1 g/dL (ref 6.0–8.5)

## 2014-12-04 LAB — LIPID PANEL
Chol/HDL Ratio: 2.5 ratio units (ref 0.0–5.0)
Cholesterol, Total: 144 mg/dL (ref 100–199)
HDL: 58 mg/dL (ref >39)
LDL Calculated: 70 mg/dL (ref 0–99)
Triglycerides: 80 mg/dL (ref 0–149)
VLDL CHOLESTEROL CAL: 16 mg/dL (ref 5–40)

## 2014-12-04 LAB — TSH: TSH: 2.71 u[IU]/mL (ref 0.450–4.500)

## 2014-12-07 ENCOUNTER — Encounter: Payer: Self-pay | Admitting: Family Medicine

## 2014-12-15 DIAGNOSIS — I340 Nonrheumatic mitral (valve) insufficiency: Secondary | ICD-10-CM | POA: Diagnosis not present

## 2014-12-15 DIAGNOSIS — I2510 Atherosclerotic heart disease of native coronary artery without angina pectoris: Secondary | ICD-10-CM | POA: Diagnosis not present

## 2014-12-15 DIAGNOSIS — I25810 Atherosclerosis of coronary artery bypass graft(s) without angina pectoris: Secondary | ICD-10-CM | POA: Diagnosis not present

## 2014-12-15 DIAGNOSIS — I10 Essential (primary) hypertension: Secondary | ICD-10-CM | POA: Diagnosis not present

## 2014-12-15 DIAGNOSIS — E785 Hyperlipidemia, unspecified: Secondary | ICD-10-CM | POA: Diagnosis not present

## 2014-12-16 DIAGNOSIS — I10 Essential (primary) hypertension: Secondary | ICD-10-CM | POA: Diagnosis not present

## 2014-12-16 DIAGNOSIS — E1122 Type 2 diabetes mellitus with diabetic chronic kidney disease: Secondary | ICD-10-CM | POA: Diagnosis not present

## 2014-12-16 DIAGNOSIS — N184 Chronic kidney disease, stage 4 (severe): Secondary | ICD-10-CM | POA: Diagnosis not present

## 2014-12-16 DIAGNOSIS — R600 Localized edema: Secondary | ICD-10-CM | POA: Diagnosis not present

## 2014-12-18 DIAGNOSIS — I340 Nonrheumatic mitral (valve) insufficiency: Secondary | ICD-10-CM | POA: Diagnosis not present

## 2014-12-21 DIAGNOSIS — I10 Essential (primary) hypertension: Secondary | ICD-10-CM | POA: Diagnosis not present

## 2014-12-21 DIAGNOSIS — E1122 Type 2 diabetes mellitus with diabetic chronic kidney disease: Secondary | ICD-10-CM | POA: Diagnosis not present

## 2014-12-21 DIAGNOSIS — R601 Generalized edema: Secondary | ICD-10-CM | POA: Diagnosis not present

## 2014-12-21 DIAGNOSIS — N184 Chronic kidney disease, stage 4 (severe): Secondary | ICD-10-CM | POA: Diagnosis not present

## 2014-12-22 DIAGNOSIS — H40002 Preglaucoma, unspecified, left eye: Secondary | ICD-10-CM | POA: Diagnosis not present

## 2015-03-17 ENCOUNTER — Ambulatory Visit: Payer: Medicare Other | Admitting: Family Medicine

## 2015-03-23 ENCOUNTER — Encounter: Payer: Self-pay | Admitting: Family Medicine

## 2015-03-23 ENCOUNTER — Ambulatory Visit (INDEPENDENT_AMBULATORY_CARE_PROVIDER_SITE_OTHER): Payer: Medicare Other | Admitting: Family Medicine

## 2015-03-23 VITALS — BP 134/73 | HR 66 | Temp 97.9°F | Ht 65.0 in | Wt 169.0 lb

## 2015-03-23 DIAGNOSIS — N184 Chronic kidney disease, stage 4 (severe): Secondary | ICD-10-CM

## 2015-03-23 DIAGNOSIS — E1122 Type 2 diabetes mellitus with diabetic chronic kidney disease: Secondary | ICD-10-CM | POA: Diagnosis not present

## 2015-03-23 DIAGNOSIS — I10 Essential (primary) hypertension: Secondary | ICD-10-CM | POA: Diagnosis not present

## 2015-03-23 DIAGNOSIS — I1310 Hypertensive heart and chronic kidney disease without heart failure, with stage 1 through stage 4 chronic kidney disease, or unspecified chronic kidney disease: Secondary | ICD-10-CM | POA: Diagnosis not present

## 2015-03-23 DIAGNOSIS — R600 Localized edema: Secondary | ICD-10-CM | POA: Diagnosis not present

## 2015-03-23 DIAGNOSIS — D692 Other nonthrombocytopenic purpura: Secondary | ICD-10-CM | POA: Diagnosis not present

## 2015-03-23 LAB — BAYER DCA HB A1C WAIVED: HB A1C: 7.2 % — AB (ref <7.0)

## 2015-03-23 NOTE — Assessment & Plan Note (Signed)
The current medical regimen is effective;  continue present plan and medications.  

## 2015-03-23 NOTE — Progress Notes (Signed)
BP 134/73 mmHg  Pulse 66  Temp(Src) 97.9 F (36.6 C)  Ht 5\' 5"  (1.651 m)  Wt 169 lb (76.658 kg)  BMI 28.12 kg/m2  SpO2 98%   Subjective:    Patient ID: Ray Lamb, male    DOB: 1930/10/24, 82 y.o.   MRN: [id number]  HPI: Ray Lamb is a 82 y.o. male  Chief Complaint  Patient presents with  . Diabetes  . would like to discuss a back support   She doing well with no low blood sugar spells good control of diabetes with no complaints from medications she takes faithfully. Patient otherwise concerned about a back support on some longer trips has some low back pain no radicular symptoms discuss going to lows and getting a back support that that would be easier than trying to go through Medicare and no more expensive.  Relevant past medical, surgical, family and social history reviewed and updated as indicated. Interim medical history since our last visit reviewed. Allergies and medications reviewed and updated.  Review of Systems  Constitutional: Negative.   Respiratory: Negative.   Cardiovascular: Negative.     Per HPI unless specifically indicated above     Objective:    BP 134/73 mmHg  Pulse 66  Temp(Src) 97.9 F (36.6 C)  Ht 5\' 5"  (1.651 m)  Wt 169 lb (76.658 kg)  BMI 28.12 kg/m2  SpO2 98%  Wt Readings from Last 3 Encounters:  03/23/15 169 lb (76.658 kg)  12/03/14 170 lb (77.111 kg)  09/03/14 169 lb (76.658 kg)    Physical Exam  Constitutional: He is oriented to person, place, and time. He appears well-developed and well-nourished. No distress.  HENT:  Head: Normocephalic and atraumatic.  Right Ear: Hearing normal.  Left Ear: Hearing normal.  Nose: Nose normal.  Eyes: Conjunctivae and lids are normal. Right eye exhibits no discharge. Left eye exhibits no discharge. No scleral icterus.  Cardiovascular: Normal rate, regular rhythm and normal heart sounds.   Pulmonary/Chest: Effort normal and breath sounds normal. No respiratory distress.  Musculoskeletal:  Normal range of motion.  Neurological: He is alert and oriented to person, place, and time.  Skin: Skin is intact. No rash noted.  Psychiatric: He has a normal mood and affect. His speech is normal and behavior is normal. Judgment and thought content normal. Cognition and memory are normal.    Results for orders placed or performed in visit on 12/03/14  Microscopic Examination  Result Value Ref Range   WBC, UA 0-5 0 -  5 /hpf   RBC, UA None seen 0 -  2 /hpf   Epithelial Cells (non renal) 0-10 0 - 10 /hpf   Renal Epithel, UA None seen None seen /hpf   Casts None seen None seen /lpf   Cast Type None seen N/A   Crystals None seen N/A   Crystal Type None seen N/A   Mucus, UA None seen Not Estab.   Bacteria, UA None seen None seen/Few   Yeast, UA None seen None seen   Trichomonas, UA None seen None seen  Comprehensive metabolic panel  Result Value Ref Range   Glucose 173 (H) 65 - 99 mg/dL   BUN 32 (H) 8 - 27 mg/dL   Creatinine, Ser 2.11 (H) 0.76 - 1.27 mg/dL   GFR calc non Af Amer 28 (L) >59 mL/min/1.73   GFR calc Af Amer 33 (L) >59 mL/min/1.73   BUN/Creatinine Ratio 15 10 - 22   Sodium 136 136 -  144 mmol/L   Potassium 5.2 3.5 - 5.2 mmol/L   Chloride 98 97 - 106 mmol/L   CO2 25 18 - 29 mmol/L   Calcium 9.9 8.6 - 10.2 mg/dL   Total Protein 7.1 6.0 - 8.5 g/dL   Albumin 4.0 3.5 - 4.7 g/dL   Globulin, Total 3.1 1.5 - 4.5 g/dL   Albumin/Globulin Ratio 1.3 1.1 - 2.5   Bilirubin Total 0.4 0.0 - 1.2 mg/dL   Alkaline Phosphatase 69 39 - 117 IU/L   AST 20 0 - 40 IU/L   ALT 15 0 - 44 IU/L  Lipid panel  Result Value Ref Range   Cholesterol, Total 144 100 - 199 mg/dL   Triglycerides 80 0 - 149 mg/dL   HDL 58 >39 mg/dL   VLDL Cholesterol Cal 16 5 - 40 mg/dL   LDL Calculated 70 0 - 99 mg/dL   Chol/HDL Ratio 2.5 0.0 - 5.0 ratio units  CBC with Differential/Platelet  Result Value Ref Range   WBC 10.2 3.4 - 10.8 x10E3/uL   RBC 3.51 (L) 4.14 - 5.80 x10E6/uL   Hemoglobin 10.9 (L) 12.6 -  17.7 g/dL   Hematocrit 33.6 (L) 37.5 - 51.0 %   MCV 96 79 - 97 fL   MCH 31.1 26.6 - 33.0 pg   MCHC 32.4 31.5 - 35.7 g/dL   RDW 13.3 12.3 - 15.4 %   Platelets 357 150 - 379 x10E3/uL   Neutrophils 48 %   Lymphs 38 %   Monocytes 10 %   Eos 3 %   Basos 1 %   Neutrophils Absolute 5.0 1.4 - 7.0 x10E3/uL   Lymphocytes Absolute 3.9 (H) 0.7 - 3.1 x10E3/uL   Monocytes Absolute 1.0 (H) 0.1 - 0.9 x10E3/uL   EOS (ABSOLUTE) 0.3 0.0 - 0.4 x10E3/uL   Basophils Absolute 0.1 0.0 - 0.2 x10E3/uL   Immature Granulocytes 0 %   Immature Grans (Abs) 0.0 0.0 - 0.1 x10E3/uL  TSH  Result Value Ref Range   TSH 2.710 0.450 - 4.500 uIU/mL  Urinalysis, Routine w reflex microscopic (not at [facility])  Result Value Ref Range   Specific Gravity, UA 1.020 1.005 - 1.030   pH, UA 5.5 5.0 - 7.5   Color, UA Yellow Yellow   Appearance Ur Clear Clear   Leukocytes, UA Negative Negative   Protein, UA 1+ (A) Negative/Trace   Glucose, UA Negative Negative   Ketones, UA Negative Negative   RBC, UA Trace (A) Negative   Bilirubin, UA Negative Negative   Urobilinogen, Ur 0.2 0.2 - 1.0 mg/dL   Nitrite, UA Negative Negative   Microscopic Examination See below:   Bayer DCA Hb A1c Waived  Result Value Ref Range   Bayer DCA Hb A1c Waived 7.5 (H) <7.0 %      Assessment & Plan:   Problem List Items Addressed This Visit      Cardiovascular and Mediastinum   Senile purpura (HCC) - Primary    Stable      Hypertensive heart and renal disease    The current medical regimen is effective;  continue present plan and medications.         Endocrine   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 disease ([place])    The current medical regimen is effective;  continue present plan and medications.           Follow up plan: Return in about 3 months (around 06/20/2015) for Recheck with BMP, lipids, ALT, AST, A1c.

## 2015-03-23 NOTE — Assessment & Plan Note (Signed)
Stable

## 2015-03-25 DIAGNOSIS — E1122 Type 2 diabetes mellitus with diabetic chronic kidney disease: Secondary | ICD-10-CM | POA: Diagnosis not present

## 2015-03-25 DIAGNOSIS — I10 Essential (primary) hypertension: Secondary | ICD-10-CM | POA: Diagnosis not present

## 2015-03-25 DIAGNOSIS — E875 Hyperkalemia: Secondary | ICD-10-CM | POA: Diagnosis not present

## 2015-03-25 DIAGNOSIS — N184 Chronic kidney disease, stage 4 (severe): Secondary | ICD-10-CM | POA: Diagnosis not present

## 2015-04-23 DIAGNOSIS — M26609 Unspecified temporomandibular joint disorder, unspecified side: Secondary | ICD-10-CM | POA: Diagnosis not present

## 2015-04-23 DIAGNOSIS — H8101 Meniere's disease, right ear: Secondary | ICD-10-CM | POA: Diagnosis not present

## 2015-04-23 DIAGNOSIS — H6062 Unspecified chronic otitis externa, left ear: Secondary | ICD-10-CM | POA: Diagnosis not present

## 2015-04-23 DIAGNOSIS — R42 Dizziness and giddiness: Secondary | ICD-10-CM | POA: Diagnosis not present

## 2015-04-23 DIAGNOSIS — H903 Sensorineural hearing loss, bilateral: Secondary | ICD-10-CM | POA: Diagnosis not present

## 2015-04-28 ENCOUNTER — Other Ambulatory Visit: Payer: Self-pay | Admitting: Family Medicine

## 2015-05-17 DIAGNOSIS — I2510 Atherosclerotic heart disease of native coronary artery without angina pectoris: Secondary | ICD-10-CM | POA: Diagnosis not present

## 2015-05-17 DIAGNOSIS — Z951 Presence of aortocoronary bypass graft: Secondary | ICD-10-CM | POA: Diagnosis not present

## 2015-05-17 DIAGNOSIS — I25810 Atherosclerosis of coronary artery bypass graft(s) without angina pectoris: Secondary | ICD-10-CM | POA: Diagnosis not present

## 2015-05-17 DIAGNOSIS — I10 Essential (primary) hypertension: Secondary | ICD-10-CM | POA: Diagnosis not present

## 2015-05-17 DIAGNOSIS — I340 Nonrheumatic mitral (valve) insufficiency: Secondary | ICD-10-CM | POA: Diagnosis not present

## 2015-05-17 DIAGNOSIS — E785 Hyperlipidemia, unspecified: Secondary | ICD-10-CM | POA: Diagnosis not present

## 2015-06-02 ENCOUNTER — Telehealth: Payer: Self-pay | Admitting: Family Medicine

## 2015-06-02 NOTE — Telephone Encounter (Signed)
Patient has Medicare so any faxed request we get must have a face to face visit for documentation.  It was probably shredded.  If they fax another one we can keep it for him until his next visit.  We get a lot of random faxes that are not legitimate.

## 2015-06-02 NOTE — Telephone Encounter (Signed)
I informed the caller that we would be contacting the patient to see if this was something the patient had truly requested, they hung up on me. Just wanted to make sure you were aware in case you see another request. Thanks.

## 2015-06-02 NOTE — Telephone Encounter (Signed)
Eddie Dibbles from [employer] called to inquire about a faxed order for this pt to receive a back brace. Do you know anything about this? Thanks.

## 2015-06-22 DIAGNOSIS — H40002 Preglaucoma, unspecified, left eye: Secondary | ICD-10-CM | POA: Diagnosis not present

## 2015-06-29 DIAGNOSIS — H40002 Preglaucoma, unspecified, left eye: Secondary | ICD-10-CM | POA: Diagnosis not present

## 2015-06-29 LAB — HM DIABETES EYE EXAM

## 2015-07-01 ENCOUNTER — Encounter: Payer: Self-pay | Admitting: Family Medicine

## 2015-07-01 ENCOUNTER — Ambulatory Visit (INDEPENDENT_AMBULATORY_CARE_PROVIDER_SITE_OTHER): Payer: Medicare Other | Admitting: Family Medicine

## 2015-07-01 VITALS — BP 134/68 | HR 52 | Temp 98.1°F | Ht 65.0 in | Wt 173.2 lb

## 2015-07-01 DIAGNOSIS — E1122 Type 2 diabetes mellitus with diabetic chronic kidney disease: Secondary | ICD-10-CM

## 2015-07-01 DIAGNOSIS — E785 Hyperlipidemia, unspecified: Secondary | ICD-10-CM

## 2015-07-01 DIAGNOSIS — N181 Chronic kidney disease, stage 1: Secondary | ICD-10-CM | POA: Diagnosis not present

## 2015-07-01 LAB — LIPID PANEL PICCOLO, WAIVED
Chol/HDL Ratio Piccolo,Waive: 2.3 mg/dL
Cholesterol Piccolo, Waived: 115 mg/dL (ref <200)
HDL Chol Piccolo, Waived: 49 mg/dL — ABNORMAL LOW (ref >59)
LDL CHOL CALC PICCOLO WAIVED: 54 mg/dL (ref <100)
Triglycerides Piccolo,Waived: 59 mg/dL (ref <150)
VLDL Chol Calc Piccolo,Waive: 12 mg/dL (ref <30)

## 2015-07-01 LAB — AST (SGOT) PICCOLO, WAIVED: AST (SGOT) Piccolo, Waived: 24 U/L (ref 11–38)

## 2015-07-01 LAB — BAYER DCA HB A1C WAIVED: HB A1C (BAYER DCA - WAIVED): 7.9 % — ABNORMAL HIGH (ref <7.0)

## 2015-07-01 LAB — ALT (SGPT) PICCOLO, WAIVED: ALT (SGPT) Piccolo, Waived: 18 U/L (ref 10–47)

## 2015-07-01 NOTE — Progress Notes (Signed)
   BP 134/68 mmHg  Pulse 52  Temp(Src) 98.1 F (36.7 C)  Ht 5\' 5"  (1.651 m)  Wt 173 lb 3.2 oz (78.563 kg)  BMI 28.82 kg/m2  SpO2 99%   Subjective:    Patient ID: Ray Lamb, male    DOB: 02-02-1931, 82 y.o.   MRN: [id number]  HPI: Ray Lamb is a 82 y.o. male  Chief Complaint  Patient presents with  . Follow-up  . Diabetes   Patient doing well except for fasting blood sugar around 200-10. No low blood sugar spells Taking medications without problems Blood pressure good control no issues Lipids stable no med issues taking long term  Relevant past medical, surgical, family and social history reviewed and updated as indicated. Interim medical history since our last visit reviewed. Allergies and medications reviewed and updated.  Review of Systems  Constitutional: Negative.   Respiratory: Negative.   Cardiovascular: Negative.     Per HPI unless specifically indicated above     Objective:    BP 134/68 mmHg  Pulse 52  Temp(Src) 98.1 F (36.7 C)  Ht 5\' 5"  (1.651 m)  Wt 173 lb 3.2 oz (78.563 kg)  BMI 28.82 kg/m2  SpO2 99%  Wt Readings from Last 3 Encounters:  07/01/15 173 lb 3.2 oz (78.563 kg)  03/23/15 169 lb (76.658 kg)  12/03/14 170 lb (77.111 kg)    Physical Exam  Constitutional: He is oriented to person, place, and time. He appears well-developed and well-nourished. No distress.  HENT:  Head: Normocephalic and atraumatic.  Right Ear: Hearing normal.  Left Ear: Hearing normal.  Nose: Nose normal.  Eyes: Conjunctivae and lids are normal. Right eye exhibits no discharge. Left eye exhibits no discharge. No scleral icterus.  Cardiovascular: Normal rate.   Pulmonary/Chest: Effort normal and breath sounds normal. No respiratory distress.  Musculoskeletal: Normal range of motion.  Neurological: He is alert and oriented to person, place, and time.  Skin: Skin is intact. No rash noted.  Psychiatric: He has a normal mood and affect. His speech is normal and  behavior is normal. Judgment and thought content normal. Cognition and memory are normal.    Results for orders placed or performed in visit on 03/23/15  Bayer DCA Hb A1c Waived  Result Value Ref Range   Bayer DCA Hb A1c Waived 7.2 (H) <7.0 %      Assessment & Plan:   Problem List Items Addressed This Visit      Other   Hyperlipidemia   Relevant Medications   isosorbide mononitrate (IMDUR) 30 MG 24 hr tablet   Other Relevant Orders   Lipid Panel Piccolo, Waived   ALT (SGPT) Piccolo, Waived   AST (SGOT) Piccolo, [place]    Other Visit Diagnoses    Type 2 diabetes mellitus with stage 1 chronic kidney disease, unspecified long term insulin use status ([place])    -  Primary    Relevant Orders    Bayer DCA Hb A1c Waived    Basic metabolic panel        Follow up plan: Return in about 3 months (around 10/01/2015) for a1c.

## 2015-07-02 LAB — BASIC METABOLIC PANEL
BUN/Creatinine Ratio: 19 (ref 10–24)
BUN: 48 mg/dL — ABNORMAL HIGH (ref 8–27)
CALCIUM: 8.9 mg/dL (ref 8.6–10.2)
CHLORIDE: 105 mmol/L (ref 96–106)
CO2: 22 mmol/L (ref 18–29)
Creatinine, Ser: 2.5 mg/dL — ABNORMAL HIGH (ref 0.76–1.27)
GFR calc Af Amer: 26 mL/min/{1.73_m2} — ABNORMAL LOW (ref >59)
GFR calc non Af Amer: 23 mL/min/{1.73_m2} — ABNORMAL LOW (ref >59)
Glucose: 164 mg/dL — ABNORMAL HIGH (ref 65–99)
POTASSIUM: 5 mmol/L (ref 3.5–5.2)
Sodium: 141 mmol/L (ref 134–144)

## 2015-07-05 ENCOUNTER — Encounter: Payer: Self-pay | Admitting: Family Medicine

## 2015-07-05 DIAGNOSIS — E1122 Type 2 diabetes mellitus with diabetic chronic kidney disease: Secondary | ICD-10-CM | POA: Diagnosis not present

## 2015-07-05 DIAGNOSIS — R600 Localized edema: Secondary | ICD-10-CM | POA: Diagnosis not present

## 2015-07-05 DIAGNOSIS — I10 Essential (primary) hypertension: Secondary | ICD-10-CM | POA: Diagnosis not present

## 2015-07-05 DIAGNOSIS — N184 Chronic kidney disease, stage 4 (severe): Secondary | ICD-10-CM | POA: Diagnosis not present

## 2015-07-16 DIAGNOSIS — N184 Chronic kidney disease, stage 4 (severe): Secondary | ICD-10-CM | POA: Diagnosis not present

## 2015-07-16 DIAGNOSIS — E1122 Type 2 diabetes mellitus with diabetic chronic kidney disease: Secondary | ICD-10-CM | POA: Diagnosis not present

## 2015-07-16 DIAGNOSIS — E875 Hyperkalemia: Secondary | ICD-10-CM | POA: Diagnosis not present

## 2015-07-16 DIAGNOSIS — R600 Localized edema: Secondary | ICD-10-CM | POA: Diagnosis not present

## 2015-08-24 DIAGNOSIS — E785 Hyperlipidemia, unspecified: Secondary | ICD-10-CM | POA: Diagnosis not present

## 2015-08-24 DIAGNOSIS — I2510 Atherosclerotic heart disease of native coronary artery without angina pectoris: Secondary | ICD-10-CM | POA: Diagnosis not present

## 2015-08-24 DIAGNOSIS — I10 Essential (primary) hypertension: Secondary | ICD-10-CM | POA: Diagnosis not present

## 2015-08-24 DIAGNOSIS — I340 Nonrheumatic mitral (valve) insufficiency: Secondary | ICD-10-CM | POA: Diagnosis not present

## 2015-09-06 DIAGNOSIS — E1122 Type 2 diabetes mellitus with diabetic chronic kidney disease: Secondary | ICD-10-CM | POA: Diagnosis not present

## 2015-09-06 DIAGNOSIS — E875 Hyperkalemia: Secondary | ICD-10-CM | POA: Diagnosis not present

## 2015-09-06 DIAGNOSIS — I10 Essential (primary) hypertension: Secondary | ICD-10-CM | POA: Diagnosis not present

## 2015-09-06 DIAGNOSIS — R600 Localized edema: Secondary | ICD-10-CM | POA: Diagnosis not present

## 2015-09-06 DIAGNOSIS — N184 Chronic kidney disease, stage 4 (severe): Secondary | ICD-10-CM | POA: Diagnosis not present

## 2015-09-23 DIAGNOSIS — M79675 Pain in left toe(s): Secondary | ICD-10-CM | POA: Diagnosis not present

## 2015-09-23 DIAGNOSIS — B351 Tinea unguium: Secondary | ICD-10-CM | POA: Diagnosis not present

## 2015-09-23 DIAGNOSIS — M79674 Pain in right toe(s): Secondary | ICD-10-CM | POA: Diagnosis not present

## 2015-10-04 DIAGNOSIS — N184 Chronic kidney disease, stage 4 (severe): Secondary | ICD-10-CM | POA: Diagnosis not present

## 2015-10-04 DIAGNOSIS — R600 Localized edema: Secondary | ICD-10-CM | POA: Diagnosis not present

## 2015-10-04 DIAGNOSIS — E875 Hyperkalemia: Secondary | ICD-10-CM | POA: Diagnosis not present

## 2015-10-04 DIAGNOSIS — E1122 Type 2 diabetes mellitus with diabetic chronic kidney disease: Secondary | ICD-10-CM | POA: Diagnosis not present

## 2015-10-04 DIAGNOSIS — I10 Essential (primary) hypertension: Secondary | ICD-10-CM | POA: Diagnosis not present

## 2015-10-06 ENCOUNTER — Encounter: Payer: Self-pay | Admitting: Family Medicine

## 2015-10-06 ENCOUNTER — Ambulatory Visit (INDEPENDENT_AMBULATORY_CARE_PROVIDER_SITE_OTHER): Payer: Medicare Other | Admitting: Family Medicine

## 2015-10-06 VITALS — BP 146/69 | HR 54 | Temp 97.9°F | Ht 64.8 in | Wt 169.0 lb

## 2015-10-06 DIAGNOSIS — E1122 Type 2 diabetes mellitus with diabetic chronic kidney disease: Secondary | ICD-10-CM | POA: Diagnosis not present

## 2015-10-06 DIAGNOSIS — S81802A Unspecified open wound, left lower leg, initial encounter: Secondary | ICD-10-CM | POA: Insufficient documentation

## 2015-10-06 DIAGNOSIS — N184 Chronic kidney disease, stage 4 (severe): Secondary | ICD-10-CM

## 2015-10-06 DIAGNOSIS — I1310 Hypertensive heart and chronic kidney disease without heart failure, with stage 1 through stage 4 chronic kidney disease, or unspecified chronic kidney disease: Secondary | ICD-10-CM | POA: Diagnosis not present

## 2015-10-06 DIAGNOSIS — Z23 Encounter for immunization: Secondary | ICD-10-CM | POA: Diagnosis not present

## 2015-10-06 LAB — MICROALBUMIN, URINE WAIVED
CREATININE, URINE WAIVED: 100 mg/dL (ref 10–300)
Microalb, Ur Waived: 80 mg/L — ABNORMAL HIGH (ref 0–19)

## 2015-10-06 LAB — BAYER DCA HB A1C WAIVED: HB A1C (BAYER DCA - WAIVED): 7.2 % — ABNORMAL HIGH (ref <7.0)

## 2015-10-06 LAB — HEMOGLOBIN A1C: Hemoglobin A1C: 7.2

## 2015-10-06 NOTE — Progress Notes (Signed)
BP (!) 146/69 (BP Location: Left Arm, Patient Position: Sitting, Cuff Size: Small)   Pulse (!) 54   Temp 97.9 F (36.6 C)   Ht 5' 4.8" (1.646 m)   Wt 169 lb (76.7 kg)   SpO2 99%   BMI 28.30 kg/m    Subjective:    Patient ID: Ray Lamb, male    DOB: Oct 22, 1930, 82 y.o.   MRN: [id number]  HPI: Ray Lamb is a 82 y.o. male  Chief Complaint  Patient presents with  . Diabetes  Patient all in all doing well has been able to loose some weight and sugars doing better fasting blood sugars coming down and feeling better A1c is also down. No low blood sugar spells  Concerned blood pressure up a little bit here but his home and checks at nephrology which notes were reviewed has all been doing well. Patient with no orthostatic symptoms. Patient with no heart symptoms cardiovascular concerns or problems.   Relevant past medical, surgical, family and social history reviewed and updated as indicated. Interim medical history since our last visit reviewed. Allergies and medications reviewed and updated.  Review of Systems  Constitutional: Negative.   Respiratory: Negative.   Cardiovascular: Negative.     Per HPI unless specifically indicated above     Objective:    BP (!) 146/69 (BP Location: Left Arm, Patient Position: Sitting, Cuff Size: Small)   Pulse (!) 54   Temp 97.9 F (36.6 C)   Ht 5' 4.8" (1.646 m)   Wt 169 lb (76.7 kg)   SpO2 99%   BMI 28.30 kg/m   Wt Readings from Last 3 Encounters:  10/06/15 169 lb (76.7 kg)  07/01/15 173 lb 3.2 oz (78.6 kg)  03/23/15 169 lb (76.7 kg)    Physical Exam  Constitutional: He is oriented to person, place, and time. He appears well-developed and well-nourished. No distress.  HENT:  Head: Normocephalic and atraumatic.  Right Ear: Hearing normal.  Left Ear: Hearing normal.  Nose: Nose normal.  Eyes: Conjunctivae and lids are normal. Right eye exhibits no discharge. Left eye exhibits no discharge. No scleral icterus.    Cardiovascular: Normal rate, regular rhythm and normal heart sounds.   Pulmonary/Chest: Effort normal. No respiratory distress.  Musculoskeletal: Normal range of motion.  Neurological: He is alert and oriented to person, place, and time.  Skin: Skin is intact. No rash noted.  Psychiatric: He has a normal mood and affect. His speech is normal and behavior is normal. Judgment and thought content normal. Cognition and memory are normal.    Results for orders placed or performed in visit on 07/01/15  Bayer DCA Hb A1c Waived  Result Value Ref Range   Bayer DCA Hb A1c Waived 7.9 (H) <7.0 %  Lipid Panel Piccolo, Waived  Result Value Ref Range   Cholesterol Piccolo, Waived 115 <200 mg/dL   HDL Chol Piccolo, Waived 49 (L) >59 mg/dL   Triglycerides Piccolo,Waived 59 <150 mg/dL   Chol/HDL Ratio Piccolo,Waive 2.3 mg/dL   LDL Chol Calc Piccolo Waived 54 <100 mg/dL   VLDL Chol Calc Piccolo,Waive 12 <30 mg/dL  Basic metabolic panel  Result Value Ref Range   Glucose 164 (H) 65 - 99 mg/dL   BUN 48 (H) 8 - 27 mg/dL   Creatinine, Ser 2.50 (H) 0.76 - 1.27 mg/dL   GFR calc non Af Amer 23 (L) >59 mL/min/1.73   GFR calc Af Amer 26 (L) >59 mL/min/1.73   BUN/Creatinine Ratio 19  10 - 24   Sodium 141 134 - 144 mmol/L   Potassium 5.0 3.5 - 5.2 mmol/L   Chloride 105 96 - 106 mmol/L   CO2 22 18 - 29 mmol/L   Calcium 8.9 8.6 - 10.2 mg/dL  ALT (SGPT) Piccolo, Waived  Result Value Ref Range   ALT (SGPT) Piccolo, Waived 18 10 - 47 U/L  AST (SGOT) Piccolo, Waived  Result Value Ref Range   AST (SGOT) Piccolo, Waived 24 11 - 38 U/L      Assessment & Plan:   Problem List Items Addressed This Visit      Cardiovascular and Mediastinum   Hypertensive heart and renal disease    Reviewed blood pressure elevated today but otherwise is been good blood observe blood pressure not making any changes to medications today        Endocrine   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 disease (HCC) - Primary     Diabetes hemoglobin A1c coming down nicely will continue current care Stable reports from nephrology has been stable for years.      Relevant Orders   Bayer DCA Hb A1c Waived   Microalbumin, Urine Waived     Other   Leg wound, left    Happened a week ago healing well scratched on tree limb. Needs tetanus shot      Relevant Orders   Td vaccine greater than or equal to 7yo preservative free IM    Other Visit Diagnoses    Need for TD vaccine       Relevant Orders   Td vaccine greater than or equal to 7yo preservative free IM     Patient also with wound on left leg is been present for a week started as a scratch has healed well will need tetanus shot  Follow up plan: Return in about 3 months (around 01/06/2016) for Physical Exam, Hemoglobin A1c.

## 2015-10-06 NOTE — Assessment & Plan Note (Signed)
Reviewed blood pressure elevated today but otherwise is been good blood observe blood pressure not making any changes to medications today

## 2015-10-06 NOTE — Assessment & Plan Note (Signed)
Diabetes hemoglobin A1c coming down nicely will continue current care Stable reports from nephrology has been stable for years.

## 2015-10-06 NOTE — Assessment & Plan Note (Signed)
Happened a week ago healing well scratched on tree limb. Needs tetanus shot

## 2015-10-08 DIAGNOSIS — E875 Hyperkalemia: Secondary | ICD-10-CM | POA: Diagnosis not present

## 2015-10-08 DIAGNOSIS — R600 Localized edema: Secondary | ICD-10-CM | POA: Diagnosis not present

## 2015-10-08 DIAGNOSIS — I10 Essential (primary) hypertension: Secondary | ICD-10-CM | POA: Diagnosis not present

## 2015-10-08 DIAGNOSIS — E1122 Type 2 diabetes mellitus with diabetic chronic kidney disease: Secondary | ICD-10-CM | POA: Diagnosis not present

## 2015-10-08 DIAGNOSIS — N184 Chronic kidney disease, stage 4 (severe): Secondary | ICD-10-CM | POA: Diagnosis not present

## 2015-11-17 ENCOUNTER — Other Ambulatory Visit: Payer: Self-pay | Admitting: Family Medicine

## 2015-11-17 DIAGNOSIS — I1310 Hypertensive heart and chronic kidney disease without heart failure, with stage 1 through stage 4 chronic kidney disease, or unspecified chronic kidney disease: Secondary | ICD-10-CM

## 2015-11-17 DIAGNOSIS — N184 Chronic kidney disease, stage 4 (severe): Principal | ICD-10-CM

## 2015-11-17 DIAGNOSIS — E1122 Type 2 diabetes mellitus with diabetic chronic kidney disease: Secondary | ICD-10-CM

## 2015-11-25 DIAGNOSIS — I10 Essential (primary) hypertension: Secondary | ICD-10-CM | POA: Diagnosis not present

## 2015-11-25 DIAGNOSIS — E785 Hyperlipidemia, unspecified: Secondary | ICD-10-CM | POA: Diagnosis not present

## 2015-11-25 DIAGNOSIS — I340 Nonrheumatic mitral (valve) insufficiency: Secondary | ICD-10-CM | POA: Diagnosis not present

## 2015-11-25 DIAGNOSIS — I25810 Atherosclerosis of coronary artery bypass graft(s) without angina pectoris: Secondary | ICD-10-CM | POA: Diagnosis not present

## 2015-11-25 DIAGNOSIS — I2510 Atherosclerotic heart disease of native coronary artery without angina pectoris: Secondary | ICD-10-CM | POA: Diagnosis not present

## 2015-11-26 ENCOUNTER — Ambulatory Visit (INDEPENDENT_AMBULATORY_CARE_PROVIDER_SITE_OTHER): Payer: Medicare Other

## 2015-11-26 DIAGNOSIS — Z23 Encounter for immunization: Secondary | ICD-10-CM | POA: Diagnosis not present

## 2015-12-29 DIAGNOSIS — H40002 Preglaucoma, unspecified, left eye: Secondary | ICD-10-CM | POA: Diagnosis not present

## 2016-01-10 DIAGNOSIS — I10 Essential (primary) hypertension: Secondary | ICD-10-CM | POA: Diagnosis not present

## 2016-01-10 DIAGNOSIS — N184 Chronic kidney disease, stage 4 (severe): Secondary | ICD-10-CM | POA: Diagnosis not present

## 2016-01-10 DIAGNOSIS — E1122 Type 2 diabetes mellitus with diabetic chronic kidney disease: Secondary | ICD-10-CM | POA: Diagnosis not present

## 2016-01-10 DIAGNOSIS — E875 Hyperkalemia: Secondary | ICD-10-CM | POA: Diagnosis not present

## 2016-01-10 DIAGNOSIS — R600 Localized edema: Secondary | ICD-10-CM | POA: Diagnosis not present

## 2016-01-14 DIAGNOSIS — I10 Essential (primary) hypertension: Secondary | ICD-10-CM | POA: Diagnosis not present

## 2016-01-14 DIAGNOSIS — R600 Localized edema: Secondary | ICD-10-CM | POA: Diagnosis not present

## 2016-01-14 DIAGNOSIS — E1122 Type 2 diabetes mellitus with diabetic chronic kidney disease: Secondary | ICD-10-CM | POA: Diagnosis not present

## 2016-01-14 DIAGNOSIS — N184 Chronic kidney disease, stage 4 (severe): Secondary | ICD-10-CM | POA: Diagnosis not present

## 2016-02-01 ENCOUNTER — Other Ambulatory Visit: Payer: Medicare Other

## 2016-02-01 ENCOUNTER — Encounter: Payer: Self-pay | Admitting: Family Medicine

## 2016-02-01 ENCOUNTER — Ambulatory Visit (INDEPENDENT_AMBULATORY_CARE_PROVIDER_SITE_OTHER): Payer: Medicare Other | Admitting: Family Medicine

## 2016-02-01 VITALS — BP 147/67 | HR 74 | Temp 98.6°F | Ht 65.6 in | Wt 172.8 lb

## 2016-02-01 DIAGNOSIS — D692 Other nonthrombocytopenic purpura: Secondary | ICD-10-CM

## 2016-02-01 DIAGNOSIS — N184 Chronic kidney disease, stage 4 (severe): Secondary | ICD-10-CM

## 2016-02-01 DIAGNOSIS — I1310 Hypertensive heart and chronic kidney disease without heart failure, with stage 1 through stage 4 chronic kidney disease, or unspecified chronic kidney disease: Secondary | ICD-10-CM

## 2016-02-01 DIAGNOSIS — R413 Other amnesia: Secondary | ICD-10-CM

## 2016-02-01 DIAGNOSIS — E1122 Type 2 diabetes mellitus with diabetic chronic kidney disease: Secondary | ICD-10-CM

## 2016-02-01 DIAGNOSIS — E7800 Pure hypercholesterolemia, unspecified: Secondary | ICD-10-CM | POA: Diagnosis not present

## 2016-02-01 DIAGNOSIS — Z0000 Encounter for general adult medical examination without abnormal findings: Secondary | ICD-10-CM

## 2016-02-01 LAB — URINALYSIS, ROUTINE W REFLEX MICROSCOPIC
Bilirubin, UA: NEGATIVE
GLUCOSE, UA: NEGATIVE
KETONES UA: NEGATIVE
LEUKOCYTES UA: NEGATIVE
Nitrite, UA: NEGATIVE
PROTEIN UA: NEGATIVE
RBC, UA: NEGATIVE
Specific Gravity, UA: 1.02 (ref 1.005–1.030)
Urobilinogen, Ur: 0.2 mg/dL (ref 0.2–1.0)
pH, UA: 5 (ref 5.0–7.5)

## 2016-02-01 LAB — BAYER DCA HB A1C WAIVED: HB A1C (BAYER DCA - WAIVED): 8.2 % — ABNORMAL HIGH (ref <7.0)

## 2016-02-01 MED ORDER — GLIPIZIDE 5 MG PO TABS: 5.0000 mg | ORAL_TABLET | Freq: Every day | ORAL | 4 refills | Status: DC

## 2016-02-01 MED ORDER — JANUVIA 25 MG PO TABS: 25.0000 mg | ORAL_TABLET | Freq: Every day | ORAL | 4 refills | Status: DC

## 2016-02-01 MED ORDER — METOPROLOL SUCCINATE ER 25 MG PO TB24: 25.0000 mg | ORAL_TABLET | Freq: Every day | ORAL | 4 refills | Status: DC

## 2016-02-01 MED ORDER — AMLODIPINE BESYLATE 5 MG PO TABS: 5.0000 mg | ORAL_TABLET | Freq: Every day | ORAL | 4 refills | Status: DC

## 2016-02-01 MED ORDER — EZETIMIBE 10 MG PO TABS: 10.0000 mg | ORAL_TABLET | Freq: Every day | ORAL | 4 refills | Status: DC

## 2016-02-01 MED ORDER — CLOPIDOGREL BISULFATE 75 MG PO TABS: 75.0000 mg | ORAL_TABLET | Freq: Every day | ORAL | 4 refills | Status: DC

## 2016-02-01 MED ORDER — ATORVASTATIN CALCIUM 80 MG PO TABS: 80.0000 mg | ORAL_TABLET | Freq: Every day | ORAL | 4 refills | Status: DC

## 2016-02-01 NOTE — Assessment & Plan Note (Signed)
The current medical regimen is effective;  continue present plan and medications.  

## 2016-02-01 NOTE — Assessment & Plan Note (Signed)
With patient's concerns about memory loss and MMSE score of 27 Will refer to neurology to further evaluate

## 2016-02-01 NOTE — Progress Notes (Signed)
 BP (!) 147/67 (BP Location: Left Arm, Patient Position: Sitting, Cuff Size: Normal)   Pulse 74   Temp 98.6 F (37 C)   Ht 5' 5.6" (1.666 m)   Wt 172 lb 12.8 oz (78.4 kg)   SpO2 96%   BMI 28.23 kg/m    Subjective:    Patient ID: Ray Lamb, male    DOB: 05/01/1930, 82 y.o.   MRN: [id number]  HPI: Ray Lamb is a 82 y.o. male  Chief Complaint  Patient presents with  . Medicare Wellness  AWV metrics met Patient follow-up for physical doing well no complaints. Diabetes doing well noted low blood sugar spells or issues taking medications faithfully without problems Cholesterol also doing well without issues with medications. Blood pressure 1 pressure elevated today but did not take any of his morning medications. Ordinarily blood pressure does well. Patient also concerned about some memory issues. Pt was driving in his neighborhood earlier this week at night and got lost. Was able to find his way home after going back to the start and thinking. As noticed some other forgetful episodes.  Relevant past medical, surgical, family and social history reviewed and updated as indicated. Interim medical history since our last visit reviewed. Allergies and medications reviewed and updated.  Review of Systems  Constitutional: Negative.   HENT: Negative.   Eyes: Negative.   Respiratory: Negative.   Cardiovascular: Negative.   Gastrointestinal: Negative.   Endocrine: Negative.   Genitourinary: Negative.   Musculoskeletal: Negative.   Skin: Negative.   Allergic/Immunologic: Negative.   Neurological: Negative.   Hematological: Negative.   Psychiatric/Behavioral: Negative.     Per HPI unless specifically indicated above     Objective:    BP (!) 147/67 (BP Location: Left Arm, Patient Position: Sitting, Cuff Size: Normal)   Pulse 74   Temp 98.6 F (37 C)   Ht 5' 5.6" (1.666 m)   Wt 172 lb 12.8 oz (78.4 kg)   SpO2 96%   BMI 28.23 kg/m   Wt Readings from Last 3 Encounters:    02/01/16 172 lb 12.8 oz (78.4 kg)  10/06/15 169 lb (76.7 kg)  07/01/15 173 lb 3.2 oz (78.6 kg)    Physical Exam  Constitutional: He is oriented to person, place, and time. He appears well-developed and well-nourished.  HENT:  Head: Normocephalic and atraumatic.  Right Ear: External ear normal.  Left Ear: External ear normal.  Eyes: Conjunctivae and EOM are normal. Pupils are equal, round, and reactive to light.  Neck: Normal range of motion. Neck supple.  Cardiovascular: Normal rate, regular rhythm, normal heart sounds and intact distal pulses.   Pulmonary/Chest: Effort normal and breath sounds normal.  Abdominal: Soft. Bowel sounds are normal. There is no splenomegaly or hepatomegaly.  Genitourinary: Penis normal.  Musculoskeletal: Normal range of motion.  Neurological: He is alert and oriented to person, place, and time. He has normal reflexes.  Skin: No rash noted. No erythema.  Psychiatric: He has a normal mood and affect. His behavior is normal. Judgment and thought content normal.    Results for orders placed or performed in visit on 02/01/16  Urinalysis, Routine w reflex microscopic (not at [facility])  Result Value Ref Range   Specific Gravity, UA 1.020 1.005 - 1.030   pH, UA 5.0 5.0 - 7.5   Color, UA Yellow Yellow   Appearance Ur Clear Clear   Leukocytes, UA Negative Negative   Protein, UA Negative Negative/Trace   Glucose, UA Negative Negative     Ketones, UA Negative Negative   RBC, UA Negative Negative   Bilirubin, UA Negative Negative   Urobilinogen, Ur 0.2 0.2 - 1.0 mg/dL   Nitrite, UA Negative Negative  Bayer DCA Hb A1c Waived  Result Value Ref Range   Bayer DCA Hb A1c Waived 8.2 (H) <7.0 %      Assessment & Plan:   Problem List Items Addressed This Visit      Cardiovascular and Mediastinum   Hypertensive heart and renal disease    The current medical regimen is effective;  continue present plan and medications.       Relevant Medications   furosemide  (LASIX) 40 MG tablet   metoprolol succinate (TOPROL-XL) 25 MG 24 hr tablet   ezetimibe (ZETIA) 10 MG tablet   clopidogrel (PLAVIX) 75 MG tablet   atorvastatin (LIPITOR) 80 MG tablet   amLODipine (NORVASC) 5 MG tablet   Senile purpura (HCC)    The current medical regimen is effective;  continue present plan and medications.       Relevant Medications   furosemide (LASIX) 40 MG tablet   metoprolol succinate (TOPROL-XL) 25 MG 24 hr tablet   ezetimibe (ZETIA) 10 MG tablet   atorvastatin (LIPITOR) 80 MG tablet   amLODipine (NORVASC) 5 MG tablet     Endocrine   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 disease ([place]) - Primary    The current medical regimen is effective;  continue present plan and medications.       Relevant Medications   JANUVIA 25 MG tablet   glipiZIDE (GLUCOTROL) 5 MG tablet   atorvastatin (LIPITOR) 80 MG tablet     Other   Hyperlipidemia   Relevant Medications   furosemide (LASIX) 40 MG tablet   metoprolol succinate (TOPROL-XL) 25 MG 24 hr tablet   ezetimibe (ZETIA) 10 MG tablet   atorvastatin (LIPITOR) 80 MG tablet   amLODipine (NORVASC) 5 MG tablet   Memory loss    With patient's concerns about memory loss and MMSE score of 27 Will refer to neurology to further evaluate      Relevant Orders   Ambulatory referral to Neurology    Other Visit Diagnoses    Hypertensive heart and renal disease, stage 1-4 or unspecified chronic kidney disease, without heart failure       Relevant Medications   furosemide (LASIX) 40 MG tablet   metoprolol succinate (TOPROL-XL) 25 MG 24 hr tablet   ezetimibe (ZETIA) 10 MG tablet   clopidogrel (PLAVIX) 75 MG tablet   atorvastatin (LIPITOR) 80 MG tablet   amLODipine (NORVASC) 5 MG tablet   PE (physical exam), annual           Follow up plan: Return in about 3 months (around 05/01/2016) for f/u meds, Hemoglobin A1c.

## 2016-02-02 ENCOUNTER — Encounter: Payer: Self-pay | Admitting: Family Medicine

## 2016-02-02 LAB — COMPREHENSIVE METABOLIC PANEL
ALBUMIN: 3.7 g/dL (ref 3.5–4.7)
ALT: 18 IU/L (ref 0–44)
AST: 22 IU/L (ref 0–40)
Albumin/Globulin Ratio: 1.1 — ABNORMAL LOW (ref 1.2–2.2)
Alkaline Phosphatase: 68 IU/L (ref 39–117)
BUN / CREAT RATIO: 24 (ref 10–24)
BUN: 64 mg/dL — AB (ref 8–27)
Bilirubin Total: 0.3 mg/dL (ref 0.0–1.2)
CALCIUM: 9.3 mg/dL (ref 8.6–10.2)
CO2: 22 mmol/L (ref 18–29)
CREATININE: 2.7 mg/dL — AB (ref 0.76–1.27)
Chloride: 101 mmol/L (ref 96–106)
GFR calc non Af Amer: 21 mL/min/{1.73_m2} — ABNORMAL LOW (ref >59)
GFR, EST AFRICAN AMERICAN: 24 mL/min/{1.73_m2} — AB (ref >59)
GLUCOSE: 195 mg/dL — AB (ref 65–99)
Globulin, Total: 3.4 g/dL (ref 1.5–4.5)
Potassium: 5 mmol/L (ref 3.5–5.2)
Sodium: 139 mmol/L (ref 134–144)
TOTAL PROTEIN: 7.1 g/dL (ref 6.0–8.5)

## 2016-02-02 LAB — CBC WITH DIFFERENTIAL/PLATELET
BASOS: 1 %
Basophils Absolute: 0.1 10*3/uL (ref 0.0–0.2)
EOS (ABSOLUTE): 0.3 10*3/uL (ref 0.0–0.4)
EOS: 4 %
HEMATOCRIT: 32.2 % — AB (ref 37.5–51.0)
Hemoglobin: 10.5 g/dL — ABNORMAL LOW (ref 13.0–17.7)
IMMATURE GRANULOCYTES: 0 %
Immature Grans (Abs): 0 10*3/uL (ref 0.0–0.1)
LYMPHS ABS: 3.6 10*3/uL — AB (ref 0.7–3.1)
Lymphs: 40 %
MCH: 30.7 pg (ref 26.6–33.0)
MCHC: 32.6 g/dL (ref 31.5–35.7)
MCV: 94 fL (ref 79–97)
MONOS ABS: 0.7 10*3/uL (ref 0.1–0.9)
Monocytes: 8 %
NEUTROS PCT: 47 %
Neutrophils Absolute: 4.2 10*3/uL (ref 1.4–7.0)
PLATELETS: 305 10*3/uL (ref 150–379)
RBC: 3.42 x10E6/uL — AB (ref 4.14–5.80)
RDW: 12.8 % (ref 12.3–15.4)
WBC: 8.9 10*3/uL (ref 3.4–10.8)

## 2016-02-02 LAB — LIPID PANEL
Chol/HDL Ratio: 2.7 ratio units (ref 0.0–5.0)
Cholesterol, Total: 120 mg/dL (ref 100–199)
HDL: 44 mg/dL (ref >39)
LDL CALC: 60 mg/dL (ref 0–99)
Triglycerides: 79 mg/dL (ref 0–149)
VLDL CHOLESTEROL CAL: 16 mg/dL (ref 5–40)

## 2016-02-02 LAB — TSH: TSH: 2.25 u[IU]/mL (ref 0.450–4.500)

## 2016-02-25 DIAGNOSIS — I2510 Atherosclerotic heart disease of native coronary artery without angina pectoris: Secondary | ICD-10-CM | POA: Diagnosis not present

## 2016-02-25 DIAGNOSIS — I25810 Atherosclerosis of coronary artery bypass graft(s) without angina pectoris: Secondary | ICD-10-CM | POA: Diagnosis not present

## 2016-02-25 DIAGNOSIS — I10 Essential (primary) hypertension: Secondary | ICD-10-CM | POA: Diagnosis not present

## 2016-02-25 DIAGNOSIS — E785 Hyperlipidemia, unspecified: Secondary | ICD-10-CM | POA: Diagnosis not present

## 2016-02-25 DIAGNOSIS — I340 Nonrheumatic mitral (valve) insufficiency: Secondary | ICD-10-CM | POA: Diagnosis not present

## 2016-03-10 DIAGNOSIS — R931 Abnormal findings on diagnostic imaging of heart and coronary circulation: Secondary | ICD-10-CM | POA: Diagnosis not present

## 2016-03-16 ENCOUNTER — Emergency Department: Payer: Medicare Other

## 2016-03-16 ENCOUNTER — Emergency Department
Admission: EM | Admit: 2016-03-16 | Discharge: 2016-03-17 | Disposition: A | Discharge status: Home / Self Care | Payer: Medicare Other | Attending: Emergency Medicine | Admitting: Emergency Medicine

## 2016-03-16 DIAGNOSIS — R1010 Upper abdominal pain, unspecified: Secondary | ICD-10-CM | POA: Insufficient documentation

## 2016-03-16 DIAGNOSIS — K3189 Other diseases of stomach and duodenum: Secondary | ICD-10-CM | POA: Diagnosis not present

## 2016-03-16 DIAGNOSIS — N189 Chronic kidney disease, unspecified: Secondary | ICD-10-CM | POA: Diagnosis not present

## 2016-03-16 DIAGNOSIS — I252 Old myocardial infarction: Secondary | ICD-10-CM | POA: Insufficient documentation

## 2016-03-16 DIAGNOSIS — I129 Hypertensive chronic kidney disease with stage 1 through stage 4 chronic kidney disease, or unspecified chronic kidney disease: Secondary | ICD-10-CM | POA: Diagnosis not present

## 2016-03-16 DIAGNOSIS — I2510 Atherosclerotic heart disease of native coronary artery without angina pectoris: Secondary | ICD-10-CM | POA: Diagnosis not present

## 2016-03-16 DIAGNOSIS — Z8673 Personal history of transient ischemic attack (TIA), and cerebral infarction without residual deficits: Secondary | ICD-10-CM | POA: Diagnosis not present

## 2016-03-16 DIAGNOSIS — Z79899 Other long term (current) drug therapy: Secondary | ICD-10-CM | POA: Diagnosis not present

## 2016-03-16 DIAGNOSIS — Z87891 Personal history of nicotine dependence: Secondary | ICD-10-CM | POA: Insufficient documentation

## 2016-03-16 LAB — URINALYSIS, COMPLETE (UACMP) WITH MICROSCOPIC
BILIRUBIN URINE: NEGATIVE
GLUCOSE, UA: 50 mg/dL — AB
HGB URINE DIPSTICK: NEGATIVE
Ketones, ur: NEGATIVE mg/dL
LEUKOCYTES UA: NEGATIVE
NITRITE: NEGATIVE
PH: 6 (ref 5.0–8.0)
Protein, ur: 100 mg/dL — AB
SPECIFIC GRAVITY, URINE: 1.016 (ref 1.005–1.030)

## 2016-03-16 LAB — COMPREHENSIVE METABOLIC PANEL
ALT: 23 U/L (ref 17–63)
AST: 30 U/L (ref 15–41)
Albumin: 3.7 g/dL (ref 3.5–5.0)
Alkaline Phosphatase: 67 U/L (ref 38–126)
Anion gap: 9 (ref 5–15)
BILIRUBIN TOTAL: 0.6 mg/dL (ref 0.3–1.2)
BUN: 44 mg/dL — AB (ref 6–20)
CHLORIDE: 103 mmol/L (ref 101–111)
CO2: 25 mmol/L (ref 22–32)
CREATININE: 2.02 mg/dL — AB (ref 0.61–1.24)
Calcium: 8.8 mg/dL — ABNORMAL LOW (ref 8.9–10.3)
GFR, EST AFRICAN AMERICAN: 33 mL/min — AB (ref >60)
GFR, EST NON AFRICAN AMERICAN: 29 mL/min — AB (ref >60)
Glucose, Bld: 183 mg/dL — ABNORMAL HIGH (ref 65–99)
Potassium: 4.4 mmol/L (ref 3.5–5.1)
Sodium: 137 mmol/L (ref 135–145)
TOTAL PROTEIN: 8 g/dL (ref 6.5–8.1)

## 2016-03-16 LAB — CBC
HCT: 33.6 % — ABNORMAL LOW (ref 40.0–52.0)
Hemoglobin: 11.2 g/dL — ABNORMAL LOW (ref 13.0–18.0)
MCH: 31.1 pg (ref 26.0–34.0)
MCHC: 33.4 g/dL (ref 32.0–36.0)
MCV: 93 fL (ref 80.0–100.0)
Platelets: 284 10*3/uL (ref 150–440)
RBC: 3.61 MIL/uL — AB (ref 4.40–5.90)
RDW: 13.1 % (ref 11.5–14.5)
WBC: 13 10*3/uL — AB (ref 3.8–10.6)

## 2016-03-16 LAB — TROPONIN I
Troponin I: <0.03 ng/mL (ref <0.03)
Troponin I: <0.03 ng/mL (ref <0.03)

## 2016-03-16 LAB — LIPASE, BLOOD: LIPASE: 38 U/L (ref 11–51)

## 2016-03-16 MED ORDER — BARIUM SULFATE 2.1 % PO SUSP
900.0000 mL | Freq: Once | ORAL | Status: AC
  Administered 2016-03-16: 900 mL via ORAL

## 2016-03-16 NOTE — Discharge Instructions (Signed)
Please return if the pain returns again. Please follow-up with Dr. Allen Norris the gastroenterologist on-call. His office visit [facility] [place], [place] [address]. He has 2 phone numbers [phone] and [phone]. He should review your CAT scan report when he sees you.

## 2016-03-16 NOTE — ED Notes (Signed)
Patient c/o abdominal pain, N/V beginning at 1830 today.

## 2016-03-16 NOTE — ED Notes (Signed)
Patient states: "It felt like the food stopped right there". Pt pointing to epigastric region.

## 2016-03-16 NOTE — ED Triage Notes (Addendum)
Pt to triage via w/c with no distress noted; Pt reports eating fried chicken about hr PTA and now feels as if it is stuck; denies hx of same but st has hiatal hernia; pt able to swallow without difficulty or pain; pt c/o upper abd pain with nausea and vomiting, describes pain as "steady"; pt st "just felt like soon as I took a bite it just got lodged"

## 2016-03-16 NOTE — ED Provider Notes (Signed)
[facility] Emergency Department Provider Note   ____________________________________________   First MD Initiated Contact with Patient 03/16/16 2122     (approximate)  I have reviewed the triage vital signs and the nursing notes.   HISTORY  Chief Complaint Abdominal Pain    HPI Ray Lamb is a 83 y.o. male who comes in reporting he had been eating fried chicken about an hour prior to arrival and after couple bites to feel like it got stuck in the middle of his upper abdomen just above the umbilicus. It felt like it would not go up or down. Patient began vomiting and reports that after a large vomit and he felt like it began to move. He is now pain free and is not having any nausea or vomiting. He was able tolerate the by mouth contrast. The pain was not in his neck or chest tube was in the upper abdomen about 3-4 inches below the xiphoid. Besides the nausea and the pain and the vomiting patient had no other complaints. Everything is now better. Pain was severe at the time. Patient reports she has a history of hiatal hernia.    Past Medical History:  Diagnosis Date  . Anemia   . Chronic kidney disease   . Coronary atherosclerosis   . GERD (gastroesophageal reflux disease)   . Glaucoma   . Hyperlipidemia   . Mini stroke ([place])   . Mitral insufficiency   . Myocardial infarction    mild, heart cath done no stents needed    Patient Active Problem List   Diagnosis Date Noted  . Memory loss 02/01/2016  . Hyperlipidemia 12/03/2014  . Senile purpura ([place]) 12/03/2014  . Hypertensive heart and renal disease 09/03/2014  . 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 disease ([place]) 09/03/2014    Past Surgical History:  Procedure Laterality Date  . CORONARY ARTERY BYPASS GRAFT    . JOINT REPLACEMENT Right 2010   knee    Prior to Admission medications   Medication Sig Start Date End Date Taking? Authorizing Provider  amLODipine (NORVASC) 5 MG tablet  Take 1 tablet (5 mg total) by mouth daily. 02/01/16   [name], MD  aspirin EC 81 MG tablet Take 81 mg by mouth daily.    Historical Provider, MD  atorvastatin (LIPITOR) 80 MG tablet Take 1 tablet (80 mg total) by mouth at bedtime. 02/01/16   [name], MD  clopidogrel (PLAVIX) 75 MG tablet Take 1 tablet (75 mg total) by mouth daily. 02/01/16   [name], MD  Coenzyme Q10 (CO Q 10) 10 MG CAPS Take by mouth daily.    Historical Provider, MD  ezetimibe (ZETIA) 10 MG tablet Take 1 tablet (10 mg total) by mouth daily. 02/01/16   [name], MD  ferrous sulfate 325 (65 FE) MG EC tablet Take 325 mg by mouth daily.    Historical Provider, MD  furosemide (LASIX) 40 MG tablet  11/17/15   Historical Provider, MD  glipiZIDE (GLUCOTROL) 5 MG tablet Take 1 tablet (5 mg total) by mouth daily. 02/01/16   [name], MD  isosorbide mononitrate (IMDUR) 30 MG 24 hr tablet  05/17/15   Historical Provider, MD  JANUVIA 25 MG tablet Take 1 tablet (25 mg total) by mouth daily. 02/01/16   [name], MD  metoprolol succinate (TOPROL-XL) 25 MG 24 hr tablet Take 1 tablet (25 mg total) by mouth daily. 02/01/16   [name], MD  mometasone (ELOCON) 0.1 % lotion APPLY 4 DROPS INTO EACH EAR DAILY FOR 10 DAYS THEN AS NEEDED FOR DRY SKIN AND ITCHING 04/23/15   Historical Provider, MD  Multiple Vitamin (MULTIVITAMIN) tablet Take 1 tablet by mouth daily.    Historical Provider, MD  olmesartan-hydrochlorothiazide (BENICAR HCT) 20-12.5 MG per tablet Take 1 tablet by mouth daily.    Historical Provider, MD  Omega-3 Fatty Acids (FISH OIL) 1000 MG CAPS Take 1,000 mg by mouth daily.    Historical Provider, MD  timolol (TIMOPTIC) 0.5 % ophthalmic solution Place 1 drop into both eyes 2 (two) times daily.    Historical Provider, MD    Allergies Bee venom and Ivp dye [iodinated diagnostic agents]  Family History  Problem Relation Age of Onset  . Stroke Mother   . Diabetes Mother   . Diabetes Sister    . Diabetes Maternal Grandmother     Social History Social History  Substance Use Topics  . Smoking status: Former Smoker    Quit date: 09/02/1964  . Smokeless tobacco: Never Used  . Alcohol use No    Review of Systems Constitutional: No fever/chills Eyes: No visual changes. ENT: No sore throat. Cardiovascular: Denies chest pain. Respiratory: Denies shortness of breath. Gastrointestinal:At present No abdominal pain.  No nausea, no vomiting.  No diarrhea.  No constipation. Genitourinary: Negative for dysuria. Musculoskeletal: Negative for back pain. Skin: Negative for rash. Neurological: Negative for headaches, focal weakness or numbness.  10-point ROS otherwise negative.  ____________________________________________   PHYSICAL EXAM:  VITAL SIGNS: ED Triage Vitals  Enc Vitals Group     BP 03/16/16 2024 (!) 186/68     Pulse Rate 03/16/16 2024 74     Resp 03/16/16 2024 20     Temp 03/16/16 2024 97.6 F (36.4 C)     Temp Source 03/16/16 2024 Oral     SpO2 03/16/16 2024 99 %     Weight 03/16/16 2031 183 lb (83 kg)     Height 03/16/16 2031 5\' 6"  (1.676 m)     Head Circumference --      Peak Flow --      Pain Score 03/16/16 2031 8     Pain Loc --      Pain Edu? --      Excl. in [place]? --     Constitutional: Alert and oriented. Well appearing and in no acute distress. Eyes: Conjunctivae are normal. PERRL. EOMI. Head: Atraumatic. Nose: No congestion/rhinnorhea. Mouth/Throat: Mucous membranes are moist.  Oropharynx non-erythematous. Neck: No stridor.  Cardiovascular: Normal rate, regular rhythm. Grossly normal heart sounds.  Good peripheral circulation. Respiratory: Normal respiratory effort.  No retractions. Lungs CTAB. Gastrointestinal: Soft and nontender. No distention. No abdominal bruits. No CVA tenderness. Musculoskeletal: No lower extremity tenderness nor edema.  No joint effusions. Neurologic:  Normal speech and language. No gross focal neurologic deficits are  appreciated. No gait instability. Skin:  Skin is warm, dry and intact. No rash noted.   ____________________________________________   LABS (all labs ordered are listed, but only abnormal results are displayed)  Labs Reviewed  COMPREHENSIVE METABOLIC PANEL - Abnormal; Notable for the following:       Result Value   Glucose, Bld 183 (*)    BUN 44 (*)    Creatinine, Ser 2.02 (*)    Calcium 8.8 (*)    GFR calc non Af Amer 29 (*)    GFR calc Af Amer 33 (*)    All other components within normal limits  CBC - Abnormal; Notable for the following:    WBC 13.0 (*)    RBC 3.61 (*)    Hemoglobin 11.2 (*)    HCT 33.6 (*)    All other components within normal limits  URINALYSIS, COMPLETE (UACMP) WITH MICROSCOPIC - Abnormal; Notable for the following:    Color, Urine YELLOW (*)    APPearance CLEAR (*)    Glucose, UA 50 (*)    Protein, ur 100 (*)    Bacteria, UA RARE (*)    Squamous Epithelial / LPF 0-5 (*)    All other components within normal limits  LIPASE, BLOOD  TROPONIN I  TROPONIN I   ____________________________________________  EKG  EKG read and interpreted by me shows normal sinus rhythm rate of 74 left axis no acute ST-T wave changes I should add that the patient also had 2 other EKGs today they both have a rate of 74 left axis no acute ST-T changes and both of the other ones look like the present one. ____________________________________________  RADIOLOGY Study Result   CLINICAL DATA:  Sensation of food lodged in abdomen just above the umbilicus. Sensation onset after eating fried chicken. Upper abdominal pain with nausea and vomiting.  EXAM: CT ABDOMEN AND PELVIS WITHOUT CONTRAST  TECHNIQUE: Multidetector CT imaging of the abdomen and pelvis was performed following the standard protocol without IV contrast.  COMPARISON:  None.  FINDINGS: Lower chest: Large hiatal hernia. Small lymph nodes adjacent to the herniated stomach. Coronary artery  calcifications. No pleural fluid or focal airspace disease. 5 mm right lower lobe nodule image 17 series 3.  Hepatobiliary: No evidence of focal hepatic lesion allowing for lack contrast. Small calcified gallstones within physiologically distended gallbladder. No pericholecystic inflammation. No biliary dilatation.  Pancreas: No ductal dilatation or inflammation.  Spleen: Normal in size without focal abnormality.  Adrenals/Urinary Tract: No adrenal nodule. Renal parenchymal thinning and perinephric edema, likely chronic renal disease. No hydronephrosis. Urinary bladder is minimally distended without wall thickening.  Stomach/Bowel: Large hiatal hernia. There is gastric wall thickening involving the left lateral aspect of the herniated stomach. No evidence of gastric outlet obstruction. No bowel obstruction. Enteric contrast is seen throughout small bowel to the mid-distal ileum. No wall thickening, inflammation or abnormal distention. Appendix is not confidently visualized. Small to moderate stool burden without colonic wall thickening or inflammation. No abnormal rectal distention.  Vascular/Lymphatic: Aortic and branch atherosclerosis, no aneurysm. Circumaortic left renal vein. There is retroperitoneal adenopathy, with aortocaval node measuring 12 mm at the level of the left renal artery, left periaortic node measures 13 mm slightly more inferiorly. Upper abdominal adenopathy adjacent to the head of the pancreas and greater curvature of the stomach, largest node measuring 13 mm short axis. Upper abdominal noted the gastro pathic ligament measures 15 mm. Prominent lymph nodes about the central mesentery. No evidence of pelvic or inguinal adenopathy.  Reproductive: Prominent prostate gland spanning 5.9 cm.  Other: No ascites or free air. Tiny fat containing umbilical hernia. Fat in the left inguinal canal.  Musculoskeletal: There are no acute or suspicious  osseous abnormalities. Age related degenerative change.  IMPRESSION: 1. Large hiatal hernia with gastric wall thickening involving the left lateral aspect of the herniated stomach. No evidence of gastric outlet obstruction. No small bowel obstruction. No evidence of bowel inflammation. 2. Mild retroperitoneal, upper abdominal, and perigastric adenopathy. Differential also include reactive, metastatic, or lump for proliferative disorder such as lymphoma. Given the gastric wall thickening, recommend endoscopy. 3. Incidental note of  gallstones. Renal parenchymal atrophy and perinephric edema suggesting chronic renal disease. Abdominal aortic atherosclerosis.   Electronically Signed   By: Jeb Levering M.D.   On: 03/16/2016 22:52    ____________________________________________   PROCEDURES  Procedure(s) performed:  Procedures  Critical Care performed:   ____________________________________________   INITIAL IMPRESSION / ASSESSMENT AND PLAN / ED COURSE  Pertinent labs & imaging results that were available during my care of the patient were reviewed by me and considered in my medical decision making (see chart for details).  Discussed with Dr. Allen Norris, he will plan on following up the patient      ____________________________________________   FINAL CLINICAL IMPRESSION(S) / ED DIAGNOSES  Final diagnoses:  Pain of upper abdomen      NEW MEDICATIONS STARTED DURING THIS VISIT:  Discharge Medication List as of 03/16/2016 11:46 PM       Note:  This document was prepared using Dragon voice recognition software and may include unintentional dictation errors.    Nena Polio, MD 03/17/16 [phone]

## 2016-03-17 NOTE — ED Notes (Signed)
Reviewed d/c instructions and follow-up care with patient. Pt verbalized understanding

## 2016-03-22 ENCOUNTER — Other Ambulatory Visit: Payer: Self-pay | Admitting: Nurse Practitioner

## 2016-03-22 DIAGNOSIS — R413 Other amnesia: Secondary | ICD-10-CM | POA: Diagnosis not present

## 2016-03-22 DIAGNOSIS — Z8673 Personal history of transient ischemic attack (TIA), and cerebral infarction without residual deficits: Secondary | ICD-10-CM | POA: Diagnosis not present

## 2016-04-03 ENCOUNTER — Ambulatory Visit
Admission: RE | Admit: 2016-04-03 | Discharge: 2016-04-03 | Disposition: A | Discharge status: Home / Self Care | Payer: Medicare Other | Source: Ambulatory Visit | Attending: Nurse Practitioner | Admitting: Nurse Practitioner

## 2016-04-03 DIAGNOSIS — R413 Other amnesia: Secondary | ICD-10-CM | POA: Diagnosis not present

## 2016-04-03 DIAGNOSIS — J0130 Acute sphenoidal sinusitis, unspecified: Secondary | ICD-10-CM | POA: Insufficient documentation

## 2016-04-03 DIAGNOSIS — G319 Degenerative disease of nervous system, unspecified: Secondary | ICD-10-CM | POA: Insufficient documentation

## 2016-04-12 ENCOUNTER — Other Ambulatory Visit: Payer: Self-pay

## 2016-04-12 ENCOUNTER — Ambulatory Visit (INDEPENDENT_AMBULATORY_CARE_PROVIDER_SITE_OTHER): Payer: Medicare Other | Admitting: Gastroenterology

## 2016-04-12 ENCOUNTER — Encounter: Payer: Self-pay | Admitting: Gastroenterology

## 2016-04-12 VITALS — BP 129/59 | HR 68 | Temp 97.5°F | Ht 66.0 in | Wt 171.0 lb

## 2016-04-12 DIAGNOSIS — R933 Abnormal findings on diagnostic imaging of other parts of digestive tract: Secondary | ICD-10-CM

## 2016-04-12 DIAGNOSIS — G8929 Other chronic pain: Secondary | ICD-10-CM | POA: Diagnosis not present

## 2016-04-12 DIAGNOSIS — R1013 Epigastric pain: Secondary | ICD-10-CM

## 2016-04-12 NOTE — Progress Notes (Signed)
Gastroenterology Consultation  Referring Provider:     [name], MD Primary Care Physician:  Golden Pop, MD Primary Gastroenterologist:  Dr. Allen Norris     Reason for Consultation:     Abnormal CT scan        HPI:   Ray Lamb is a 83 y.o. y/o male referred for consultation & management of Abnormal CT scan by Dr. Golden Pop, MD.  This patient comes in today after being seen in the emergency room for abdominal pain and the feeling of having intestinal obstruction. The patient states he had some vomiting. The patient states he started to feel better after he took the contrast for the CT scan. The patient then states he has not had any further problems. The patient's last upper endoscopy was in 2008 and he was found to have a hiatal hernia. The patient underwent a CT scan in the ER that showed him to have thickening of the gastric wall with some lymph nodes concerning for possible lymphoma versus metastatic disease. Port that he is doing well now without any complaints. There is no report of any further nausea vomiting black stools or bloody stools. The patient also denies any change in bowel habits. It was recommended on the CT scan that the patient undergo an upper endoscopy.  Past Medical History:  Diagnosis Date  . Anemia   . Chronic kidney disease   . Coronary atherosclerosis   . GERD (gastroesophageal reflux disease)   . Glaucoma   . Hyperlipidemia   . Mini stroke ([place])   . Mitral insufficiency   . Myocardial infarction    mild, heart cath done no stents needed    Past Surgical History:  Procedure Laterality Date  . CORONARY ARTERY BYPASS GRAFT    . JOINT REPLACEMENT Right 2010   knee    Prior to Admission medications   Medication Sig Start Date End Date Taking? Authorizing Provider  amLODipine (NORVASC) 5 MG tablet Take 1 tablet (5 mg total) by mouth daily. 02/01/16  Yes [name], MD  aspirin EC 81 MG tablet Take 81 mg by mouth daily.   Yes Historical  Provider, MD  atorvastatin (LIPITOR) 80 MG tablet Take 1 tablet (80 mg total) by mouth at bedtime. 02/01/16  Yes [name], MD  clopidogrel (PLAVIX) 75 MG tablet Take 1 tablet (75 mg total) by mouth daily. 02/01/16  Yes [name], MD  Coenzyme Q10 (CO Q 10) 10 MG CAPS Take by mouth daily.   Yes Historical Provider, MD  donepezil (ARICEPT) 5 MG tablet Take by mouth. 03/22/16 06/20/16 Yes Historical Provider, MD  ezetimibe (ZETIA) 10 MG tablet Take 1 tablet (10 mg total) by mouth daily. 02/01/16  Yes [name], MD  furosemide (LASIX) 40 MG tablet  11/17/15  Yes Historical Provider, MD  glipiZIDE (GLUCOTROL) 5 MG tablet Take 1 tablet (5 mg total) by mouth daily. 02/01/16  Yes [name], MD  isosorbide mononitrate (IMDUR) 30 MG 24 hr tablet  05/17/15  Yes Historical Provider, MD  JANUVIA 25 MG tablet Take 1 tablet (25 mg total) by mouth daily. 02/01/16  Yes [name], MD  metoprolol succinate (TOPROL-XL) 25 MG 24 hr tablet Take 1 tablet (25 mg total) by mouth daily. 02/01/16  Yes [name], MD  Multiple Vitamin (MULTIVITAMIN) tablet Take 1 tablet by mouth daily.   Yes Historical Provider, MD  olmesartan-hydrochlorothiazide (BENICAR HCT) 20-12.5 MG per tablet Take 1 tablet  by mouth daily.   Yes Historical Provider, MD  Omega-3 Fatty Acids (FISH OIL) 1000 MG CAPS Take 1,000 mg by mouth daily.   Yes Historical Provider, MD  timolol (TIMOPTIC) 0.5 % ophthalmic solution Place 1 drop into both eyes 2 (two) times daily.   Yes Historical Provider, MD  ferrous sulfate 325 (65 FE) MG EC tablet Take 325 mg by mouth daily.    Historical Provider, MD  mometasone (ELOCON) 0.1 % lotion APPLY 4 DROPS INTO EACH EAR DAILY FOR 10 DAYS THEN AS NEEDED FOR DRY SKIN AND ITCHING 04/23/15   Historical Provider, MD    Family History  Problem Relation Age of Onset  . Stroke Mother   . Diabetes Mother   . Diabetes Sister   . Diabetes Maternal Grandmother      Social History  Substance Use  Topics  . Smoking status: Former Smoker    Quit date: 09/02/1964  . Smokeless tobacco: Never Used  . Alcohol use No    Allergies as of 04/12/2016 - Review Complete 04/12/2016  Allergen Reaction Noted  . Bee venom  09/02/2014  . Ivp dye [iodinated diagnostic agents]  09/02/2014    Review of Systems:    All systems reviewed and negative except where noted in HPI.   Physical Exam:  BP (!) 129/59   Pulse 68   Temp 97.5 F (36.4 C) (Oral)   Ht 5\' 6"  (1.676 m)   Wt 171 lb (77.6 kg)   BMI 27.60 kg/m  No LMP for male patient. Psych:  Alert and cooperative. Normal mood and affect. General:   Alert,  Well-developed, well-nourished, pleasant and cooperative in NAD Head:  Normocephalic and atraumatic. Eyes:  Sclera clear, no icterus.   Conjunctiva pink. Ears:  Normal auditory acuity. Nose:  No deformity, discharge, or lesions. Mouth:  No deformity or lesions,oropharynx pink & moist. Neck:  Supple; no masses or thyromegaly. Lungs:  Respirations even and unlabored.  Clear throughout to auscultation.   No wheezes, crackles, or rhonchi. No acute distress. Heart:  Regular rate and rhythm; no murmurs, clicks, rubs, or gallops. Abdomen:  Normal bowel sounds.  No bruits.  Soft, non-tender and non-distended without masses, hepatosplenomegaly or hernias noted.  No guarding or rebound tenderness.  Negative Carnett sign.   Rectal:  Deferred.  Msk:  Symmetrical without gross deformities.  Good, equal movement & strength bilaterally. Pulses:  Normal pulses noted. Extremities:  No clubbing or edema.  No cyanosis. Neurologic:  Alert and oriented x3;  grossly normal neurologically. Skin:  Intact without significant lesions or rashes.  No jaundice. Lymph Nodes:  No significant cervical adenopathy. Psych:  Alert and cooperative. Normal mood and affect.  Imaging Studies: Ct Abdomen Pelvis Wo Contrast  Result Date: 03/16/2016 CLINICAL DATA:  Sensation of food lodged in abdomen just above the umbilicus.  Sensation onset after eating fried chicken. Upper abdominal pain with nausea and vomiting. EXAM: CT ABDOMEN AND PELVIS WITHOUT CONTRAST TECHNIQUE: Multidetector CT imaging of the abdomen and pelvis was performed following the standard protocol without IV contrast. COMPARISON:  None. FINDINGS: Lower chest: Large hiatal hernia. Small lymph nodes adjacent to the herniated stomach. Coronary artery calcifications. No pleural fluid or focal airspace disease. 5 mm right lower lobe nodule image 17 series 3. Hepatobiliary: No evidence of focal hepatic lesion allowing for lack contrast. Small calcified gallstones within physiologically distended gallbladder. No pericholecystic inflammation. No biliary dilatation. Pancreas: No ductal dilatation or inflammation. Spleen: Normal in size without focal abnormality. Adrenals/Urinary Tract:  No adrenal nodule. Renal parenchymal thinning and perinephric edema, likely chronic renal disease. No hydronephrosis. Urinary bladder is minimally distended without wall thickening. Stomach/Bowel: Large hiatal hernia. There is gastric wall thickening involving the left lateral aspect of the herniated stomach. No evidence of gastric outlet obstruction. No bowel obstruction. Enteric contrast is seen throughout small bowel to the mid-distal ileum. No wall thickening, inflammation or abnormal distention. Appendix is not confidently visualized. Small to moderate stool burden without colonic wall thickening or inflammation. No abnormal rectal distention. Vascular/Lymphatic: Aortic and branch atherosclerosis, no aneurysm. Circumaortic left renal vein. There is retroperitoneal adenopathy, with aortocaval node measuring 12 mm at the level of the left renal artery, left periaortic node measures 13 mm slightly more inferiorly. Upper abdominal adenopathy adjacent to the head of the pancreas and greater curvature of the stomach, largest node measuring 13 mm short axis. Upper abdominal noted the gastro pathic  ligament measures 15 mm. Prominent lymph nodes about the central mesentery. No evidence of pelvic or inguinal adenopathy. Reproductive: Prominent prostate gland spanning 5.9 cm. Other: No ascites or free air. Tiny fat containing umbilical hernia. Fat in the left inguinal canal. Musculoskeletal: There are no acute or suspicious osseous abnormalities. Age related degenerative change. IMPRESSION: 1. Large hiatal hernia with gastric wall thickening involving the left lateral aspect of the herniated stomach. No evidence of gastric outlet obstruction. No small bowel obstruction. No evidence of bowel inflammation. 2. Mild retroperitoneal, upper abdominal, and perigastric adenopathy. Differential also include reactive, metastatic, or lump for proliferative disorder such as lymphoma. Given the gastric wall thickening, recommend endoscopy. 3. Incidental note of gallstones. Renal parenchymal atrophy and perinephric edema suggesting chronic renal disease. Abdominal aortic atherosclerosis. Electronically Signed   By: Jeb Levering M.D.   On: 03/16/2016 22:52   Mr Brain Wo Contrast  Result Date: 04/03/2016 CLINICAL DATA:  Memory loss EXAM: MRI HEAD WITHOUT CONTRAST TECHNIQUE: Multiplanar, multiecho pulse sequences of the brain and surrounding structures were obtained without intravenous contrast. COMPARISON:  Head CT 04/10/2009 FINDINGS: Brain: No reversible finding including extra-axial collection, mass, or hydrocephalus. No abnormal cortical or thalamic diffusion abnormality. Mild to moderate cortical atrophy for age without specific pattern. Mesial temporal volume loss is mild and congruent. 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 Probable remote lacunar infarct along the right posterior internal capsule. No acute infarct or hemorrhage. No chronic blood products. Vascular: Normal flow voids. Skull and upper cervical spine: Normal marrow signal. Sinuses/Orbits: Completely opacified left sphenoid sinus with  central inspissated secretions signal. IMPRESSION: 1. No specific or reversible explanation for memory loss. 2. Mild to moderate cortical atrophy without specific pattern. 3. Chronically obstructed left sphenoid sinus. Electronically Signed   By: Monte Fantasia M.D.   On: 04/03/2016 11:10    Assessment and Plan:   Ray Lamb is a 83 y.o. y/o male who was in the emergency room with nausea vomiting and the feeling of intestinal obstruction. The CT scan showed thickening of the gastric wall with abdominal lymph nodes be enlarged. The patient will be set up for an upper endoscopy to look for any gastric pathology as the cause of his symptoms. I have discussed risks & benefits which include, but are not limited to, bleeding, infection, perforation & drug reaction.  The patient agrees with this plan & written consent will be obtained.       Lucilla Lame, MD. Marval Regal   Note: This dictation was prepared with Dragon dictation along with smaller phrase technology. Any transcriptional errors that  result from this process are unintentional.

## 2016-04-25 ENCOUNTER — Ambulatory Visit: Payer: Medicare Other | Admitting: Anesthesiology

## 2016-04-25 ENCOUNTER — Ambulatory Visit
Admission: RE | Admit: 2016-04-25 | Discharge: 2016-04-25 | Disposition: A | Discharge status: Home / Self Care | Payer: Medicare Other | Source: Ambulatory Visit | Attending: Gastroenterology | Admitting: Gastroenterology

## 2016-04-25 ENCOUNTER — Encounter
Admission: RE | Disposition: A | Discharge status: Home / Self Care | Payer: Self-pay | Source: Ambulatory Visit | Attending: Gastroenterology

## 2016-04-25 ENCOUNTER — Encounter: Payer: Self-pay | Admitting: Gastroenterology

## 2016-04-25 DIAGNOSIS — Z7984 Long term (current) use of oral hypoglycemic drugs: Secondary | ICD-10-CM | POA: Diagnosis not present

## 2016-04-25 DIAGNOSIS — E1122 Type 2 diabetes mellitus with diabetic chronic kidney disease: Secondary | ICD-10-CM | POA: Insufficient documentation

## 2016-04-25 DIAGNOSIS — Z7982 Long term (current) use of aspirin: Secondary | ICD-10-CM | POA: Insufficient documentation

## 2016-04-25 DIAGNOSIS — Z87891 Personal history of nicotine dependence: Secondary | ICD-10-CM | POA: Insufficient documentation

## 2016-04-25 DIAGNOSIS — I129 Hypertensive chronic kidney disease with stage 1 through stage 4 chronic kidney disease, or unspecified chronic kidney disease: Secondary | ICD-10-CM | POA: Diagnosis not present

## 2016-04-25 DIAGNOSIS — G8929 Other chronic pain: Secondary | ICD-10-CM | POA: Diagnosis not present

## 2016-04-25 DIAGNOSIS — Z7901 Long term (current) use of anticoagulants: Secondary | ICD-10-CM | POA: Insufficient documentation

## 2016-04-25 DIAGNOSIS — Z951 Presence of aortocoronary bypass graft: Secondary | ICD-10-CM | POA: Insufficient documentation

## 2016-04-25 DIAGNOSIS — K2980 Duodenitis without bleeding: Secondary | ICD-10-CM | POA: Diagnosis not present

## 2016-04-25 DIAGNOSIS — R933 Abnormal findings on diagnostic imaging of other parts of digestive tract: Secondary | ICD-10-CM | POA: Diagnosis not present

## 2016-04-25 DIAGNOSIS — N189 Chronic kidney disease, unspecified: Secondary | ICD-10-CM | POA: Insufficient documentation

## 2016-04-25 DIAGNOSIS — I2510 Atherosclerotic heart disease of native coronary artery without angina pectoris: Secondary | ICD-10-CM | POA: Insufficient documentation

## 2016-04-25 DIAGNOSIS — Z79899 Other long term (current) drug therapy: Secondary | ICD-10-CM | POA: Diagnosis not present

## 2016-04-25 DIAGNOSIS — K2990 Gastroduodenitis, unspecified, without bleeding: Secondary | ICD-10-CM | POA: Diagnosis not present

## 2016-04-25 DIAGNOSIS — K219 Gastro-esophageal reflux disease without esophagitis: Secondary | ICD-10-CM | POA: Insufficient documentation

## 2016-04-25 DIAGNOSIS — K2970 Gastritis, unspecified, without bleeding: Secondary | ICD-10-CM

## 2016-04-25 DIAGNOSIS — I252 Old myocardial infarction: Secondary | ICD-10-CM | POA: Insufficient documentation

## 2016-04-25 DIAGNOSIS — K2940 Chronic atrophic gastritis without bleeding: Secondary | ICD-10-CM | POA: Insufficient documentation

## 2016-04-25 DIAGNOSIS — R1013 Epigastric pain: Secondary | ICD-10-CM | POA: Diagnosis not present

## 2016-04-25 DIAGNOSIS — R198 Other specified symptoms and signs involving the digestive system and abdomen: Secondary | ICD-10-CM

## 2016-04-25 DIAGNOSIS — K449 Diaphragmatic hernia without obstruction or gangrene: Secondary | ICD-10-CM | POA: Diagnosis not present

## 2016-04-25 DIAGNOSIS — I25810 Atherosclerosis of coronary artery bypass graft(s) without angina pectoris: Secondary | ICD-10-CM | POA: Diagnosis not present

## 2016-04-25 DIAGNOSIS — D649 Anemia, unspecified: Secondary | ICD-10-CM | POA: Diagnosis not present

## 2016-04-25 HISTORY — PX: ESOPHAGOGASTRODUODENOSCOPY (EGD) WITH PROPOFOL: SHX5813

## 2016-04-25 LAB — GLUCOSE, CAPILLARY: Glucose-Capillary: 158 mg/dL — ABNORMAL HIGH (ref 65–99)

## 2016-04-25 SURGERY — ESOPHAGOGASTRODUODENOSCOPY (EGD) WITH PROPOFOL
Anesthesia: General

## 2016-04-25 MED ORDER — FENTANYL CITRATE (PF) 100 MCG/2ML IJ SOLN
INTRAMUSCULAR | Status: DC | PRN
  Administered 2016-04-25: 50 ug via INTRAVENOUS

## 2016-04-25 MED ORDER — PROPOFOL 10 MG/ML IV BOLUS
INTRAVENOUS | Status: DC | PRN
  Administered 2016-04-25: 50 mg via INTRAVENOUS

## 2016-04-25 MED ORDER — PROPOFOL 10 MG/ML IV BOLUS
INTRAVENOUS | Status: AC
  Filled 2016-04-25: qty 20

## 2016-04-25 MED ORDER — FENTANYL CITRATE (PF) 100 MCG/2ML IJ SOLN
INTRAMUSCULAR | Status: AC
  Filled 2016-04-25: qty 2

## 2016-04-25 MED ORDER — EPHEDRINE SULFATE 50 MG/ML IJ SOLN
INTRAMUSCULAR | Status: DC | PRN
  Administered 2016-04-25: 10 mg via INTRAVENOUS
  Administered 2016-04-25: 15 mg via INTRAVENOUS

## 2016-04-25 MED ORDER — LIDOCAINE HCL (CARDIAC) 20 MG/ML IV SOLN
INTRAVENOUS | Status: DC | PRN
  Administered 2016-04-25: 60 mg via INTRAVENOUS

## 2016-04-25 MED ORDER — GLYCOPYRROLATE 0.2 MG/ML IJ SOLN
INTRAMUSCULAR | Status: DC | PRN
  Administered 2016-04-25: .2 mg via INTRAVENOUS

## 2016-04-25 MED ORDER — SODIUM CHLORIDE 0.9 % IV SOLN
INTRAVENOUS | Status: DC
  Administered 2016-04-25: 09:00:00 via INTRAVENOUS
  Administered 2016-04-25: 1000 mL via INTRAVENOUS

## 2016-04-25 NOTE — Anesthesia Procedure Notes (Signed)
Date/Time: 04/25/2016 9:25 AM Performed by: Allean Found Pre-anesthesia Checklist: Patient identified, Emergency Drugs available, Suction available, Patient being monitored and Timeout performed Patient Re-evaluated:Patient Re-evaluated prior to inductionOxygen Delivery Method: Nasal cannula Intubation Type: IV induction Placement Confirmation: CO2 detector

## 2016-04-25 NOTE — Op Note (Signed)
[facility] Gastroenterology Patient Name: Ray Lamb Procedure Date: 04/25/2016 8:44 AM MRN: [id number] Account #: [id number] Date of Birth: 1930-12-03 Admit Type: Outpatient Age: 83 Room: [facility] ENDO ROOM 4 Gender: Male Note Status: Finalized Procedure:            Upper GI endoscopy Indications:          Abnormal CT of the GI tract Providers:            Lucilla Lame MD, MD Referring MD:         Lazarus Gowda (Referring MD) Medicines:            Propofol per Anesthesia Complications:        No immediate complications. Procedure:            Pre-Anesthesia Assessment:                       - Prior to the procedure, a History and Physical was                        performed, and patient medications and allergies were                        reviewed. The patient's tolerance of previous                        anesthesia was also reviewed. The risks and benefits of                        the procedure and the sedation options and risks were                        discussed with the patient. All questions were                        answered, and informed consent was obtained. Prior                        Anticoagulants: The patient has taken no previous                        anticoagulant or antiplatelet agents. ASA Grade                        Assessment: II - A patient with mild systemic disease.                        After reviewing the risks and benefits, the patient was                        deemed in satisfactory condition to undergo the                        procedure.                       After obtaining informed consent, the endoscope was                        passed under direct vision. Throughout the procedure,  the patient's blood pressure, pulse, and oxygen                        saturations were monitored continuously. The Endoscope                        was introduced through the mouth, and advanced to the          second part of duodenum. The upper GI endoscopy was                        accomplished without difficulty. The patient tolerated                        the procedure well. Findings:      A large hiatal hernia was present.      Localized moderate inflammation characterized by erythema was found in       the gastric antrum. Biopsies were taken with a cold forceps for       histology.      Localized moderate inflammation characterized by erythema was found in       the duodenal bulb and in the first portion of the duodenum. Biopsies       were taken with a cold forceps for histology. Impression:           - Large hiatal hernia.                       - Gastritis. Biopsied.                       - Duodenitis. Biopsied. Recommendation:       - Await pathology results.                       - Discharge patient to home.                       - Resume previous diet.                       - Continue present medications. Procedure Code(s):    --- Professional ---                       [phone], Esophagogastroduodenoscopy, flexible, transoral;                        with biopsy, single or multiple Diagnosis Code(s):    --- Professional ---                       R93.3, Abnormal findings on diagnostic imaging of other                        parts of digestive tract                       K29.70, Gastritis, unspecified, without bleeding                       K29.80, Duodenitis without bleeding CPT copyright 2016 American Medical Association. All rights reserved. The codes documented in this report are preliminary and upon coder review may  be revised to meet current  compliance requirements. Lucilla Lame MD, MD 04/25/2016 9:23:36 AM This report has been signed electronically. Number of Addenda: 0 Note Initiated On: 04/25/2016 8:44 AM      [facility]

## 2016-04-25 NOTE — H&P (Signed)
Ray Lame, MD [facility] [address], [place], [place] [address] Phone: [phone] Fax : [phone]  Primary Care Physician:  Ray Pop, MD Primary Gastroenterologist:  Dr. Allen Lamb  Pre-Procedure History & Physical: HPI:  Ray Lamb is a 83 y.o. male is here for an endoscopy.   Past Medical History:  Diagnosis Date  . Anemia   . Chronic kidney disease   . Coronary atherosclerosis   . GERD (gastroesophageal reflux disease)   . Glaucoma   . Hyperlipidemia   . Mini stroke ([place])   . Mitral insufficiency   . Myocardial infarction    mild, heart cath done no stents needed    Past Surgical History:  Procedure Laterality Date  . CORONARY ARTERY BYPASS GRAFT    . JOINT REPLACEMENT Right 2010   knee    Prior to Admission medications   Medication Sig Start Date End Date Taking? Authorizing Provider  amLODipine (NORVASC) 5 MG tablet Take 1 tablet (5 mg total) by mouth daily. 02/01/16  Yes Ray Maple, MD  aspirin EC 81 MG tablet Take 81 mg by mouth daily.   Yes Historical Provider, MD  atorvastatin (LIPITOR) 80 MG tablet Take 1 tablet (80 mg total) by mouth at bedtime. 02/01/16  Yes Ray Maple, MD  clopidogrel (PLAVIX) 75 MG tablet Take 1 tablet (75 mg total) by mouth daily. 02/01/16  Yes Ray Maple, MD  Coenzyme Q10 (CO Q 10) 10 MG CAPS Take by mouth daily.   Yes Historical Provider, MD  donepezil (ARICEPT) 5 MG tablet Take by mouth. 03/22/16 06/20/16 Yes Historical Provider, MD  ezetimibe (ZETIA) 10 MG tablet Take 1 tablet (10 mg total) by mouth daily. 02/01/16  Yes Ray Maple, MD  ferrous sulfate 325 (65 FE) MG EC tablet Take 325 mg by mouth daily.   Yes Historical Provider, MD  furosemide (LASIX) 40 MG tablet  11/17/15  Yes Historical Provider, MD  glipiZIDE (GLUCOTROL) 5 MG tablet Take 1 tablet (5 mg total) by mouth daily. 02/01/16  Yes Ray Maple, MD  isosorbide mononitrate (IMDUR) 30 MG 24 hr tablet  05/17/15  Yes Historical Provider, MD    JANUVIA 25 MG tablet Take 1 tablet (25 mg total) by mouth daily. 02/01/16  Yes Ray Maple, MD  metoprolol succinate (TOPROL-XL) 25 MG 24 hr tablet Take 1 tablet (25 mg total) by mouth daily. 02/01/16  Yes Ray Maple, MD  mometasone (ELOCON) 0.1 % lotion APPLY 4 DROPS INTO EACH EAR DAILY FOR 10 DAYS THEN AS NEEDED FOR DRY SKIN AND ITCHING 04/23/15  Yes Historical Provider, MD  Multiple Vitamin (MULTIVITAMIN) tablet Take 1 tablet by mouth daily.   Yes Historical Provider, MD  olmesartan-hydrochlorothiazide (BENICAR HCT) 20-12.5 MG per tablet Take 1 tablet by mouth daily.   Yes Historical Provider, MD  Omega-3 Fatty Acids (FISH OIL) 1000 MG CAPS Take 1,000 mg by mouth daily.   Yes Historical Provider, MD  timolol (TIMOPTIC) 0.5 % ophthalmic solution Place 1 drop into both eyes 2 (two) times daily.   Yes Historical Provider, MD    Allergies as of 04/12/2016 - Review Complete 04/12/2016  Allergen Reaction Noted  . Bee venom  09/02/2014  . Ivp dye [iodinated diagnostic agents]  09/02/2014    Family History  Problem Relation Age of Onset  . Stroke Mother   . Diabetes Mother   . Diabetes Sister   . Diabetes Maternal Grandmother     Social History   Social  History  . Marital status: Married    Spouse name: N/A  . Number of children: N/A  . Years of education: N/A   Occupational History  . Not on file.   Social History Main Topics  . Smoking status: Former Smoker    Quit date: 09/02/1964  . Smokeless tobacco: Never Used  . Alcohol use No  . Drug use: No  . Sexual activity: Not on file   Other Topics Concern  . Not on file   Social History Narrative  . No narrative on file    Review of Systems: See HPI, otherwise negative ROS  Physical Exam: BP (!) 144/51   Pulse (!) 54   Temp 97.1 F (36.2 C) (Tympanic)   Resp 16   Ht 5\' 7"  (1.702 m)   Wt 180 lb (81.6 kg)   SpO2 100%   BMI 28.19 kg/m  General:   Alert,  pleasant and cooperative in NAD Head:   Normocephalic and atraumatic. Neck:  Supple; no masses or thyromegaly. Lungs:  Clear throughout to auscultation.    Heart:  Regular rate and rhythm. Abdomen:  Soft, nontender and nondistended. Normal bowel sounds, without guarding, and without rebound.   Neurologic:  Alert and  oriented x4;  grossly normal neurologically.  Impression/Plan: Ray Lamb is here for an endoscopy to be performed for Abnormal CT  Risks, benefits, limitations, and alternatives regarding  endoscopy have been reviewed with the patient.  Questions have been answered.  All parties agreeable.   Ray Lame, MD  04/25/2016, 9:10 AM

## 2016-04-25 NOTE — Anesthesia Preprocedure Evaluation (Signed)
Anesthesia Evaluation  Patient identified by MRN, date of birth, ID band Patient awake    Reviewed: Allergy & Precautions, NPO status , Patient's Chart, lab work & pertinent test results  History of Anesthesia Complications Negative for: history of anesthetic complications  Airway Mallampati: II  TM Distance: >3 FB Neck ROM: Full    Dental  (+) Poor Dentition   Pulmonary neg sleep apnea, neg COPD, former smoker,    breath sounds clear to auscultation- rhonchi (-) wheezing      Cardiovascular hypertension, + CAD, + Past MI and + CABG (2003)   Rhythm:Regular Rate:Normal - Systolic murmurs and - Diastolic murmurs Cath in 6734 after MI, no stents done   Neuro/Psych negative psych ROS   GI/Hepatic Neg liver ROS, GERD  ,  Endo/Other  diabetes, Oral Hypoglycemic Agents  Renal/GU CRFRenal disease     Musculoskeletal negative musculoskeletal ROS (+)   Abdominal (+) - obese,   Peds  Hematology  (+) anemia ,   Anesthesia Other Findings Past Medical History: No date: Anemia No date: Chronic kidney disease No date: Coronary atherosclerosis No date: GERD (gastroesophageal reflux disease) No date: Glaucoma No date: Hyperlipidemia No date: Mini stroke ([facility]) No date: Mitral insufficiency No date: Myocardial infarction     Comment: mild, heart cath done no stents needed   Reproductive/Obstetrics                             Anesthesia Physical Anesthesia Plan  ASA: III  Anesthesia Plan: General   Post-op Pain Management:    Induction: Intravenous  Airway Management Planned: Natural Airway  Additional Equipment:   Intra-op Plan:   Post-operative Plan:   Informed Consent: I have reviewed the patients History and Physical, chart, labs and discussed the procedure including the risks, benefits and alternatives for the proposed anesthesia with the patient or authorized representative who has  indicated his/her understanding and acceptance.   Dental advisory given  Plan Discussed with: CRNA and Anesthesiologist  Anesthesia Plan Comments:         Anesthesia Quick Evaluation

## 2016-04-25 NOTE — Anesthesia Postprocedure Evaluation (Signed)
Anesthesia Post Note  Patient: Ray Lamb  Procedure(s) Performed: Procedure(s) (LRB): ESOPHAGOGASTRODUODENOSCOPY (EGD) WITH PROPOFOL (N/A)  Patient location during evaluation: Endoscopy Anesthesia Type: General Level of consciousness: awake and alert and oriented Pain management: pain level controlled Vital Signs Assessment: post-procedure vital signs reviewed and stable Respiratory status: spontaneous breathing, nonlabored ventilation and respiratory function stable Cardiovascular status: blood pressure returned to baseline and stable Postop Assessment: no signs of nausea or vomiting Anesthetic complications: no     Last Vitals:  Vitals:   04/25/16 0950 04/25/16 1000  BP: (!) 108/45 (!) 115/54  Pulse: 77 73  Resp: 18 16  Temp:      Last Pain:  Vitals:   04/25/16 0930  TempSrc: Tympanic                 [facility] Console

## 2016-04-25 NOTE — Anesthesia Post-op Follow-up Note (Cosign Needed)
Anesthesia QCDR form completed.        

## 2016-04-25 NOTE — Transfer of Care (Signed)
Immediate Anesthesia Transfer of Care Note  Patient: Ray Lamb  Procedure(s) Performed: Procedure(s): ESOPHAGOGASTRODUODENOSCOPY (EGD) WITH PROPOFOL (N/A)  Patient Location: PACU  Anesthesia Type:General  Level of Consciousness: sedated  Airway & Oxygen Therapy: Patient Spontanous Breathing and Patient connected to nasal cannula oxygen  Post-op Assessment: Report given to RN and Post -op Vital signs reviewed and stable  Post vital signs: Reviewed and stable  Last Vitals:  Vitals:   04/25/16 0825  BP: (!) 144/51  Pulse: (!) 54  Resp: 16  Temp: 36.2 C    Last Pain:  Vitals:   04/25/16 0825  TempSrc: Tympanic         Complications: No apparent anesthesia complications

## 2016-04-25 NOTE — Anesthesia Procedure Notes (Signed)
Performed by: Thamas Appleyard       

## 2016-04-27 LAB — SURGICAL PATHOLOGY

## 2016-05-02 ENCOUNTER — Telehealth: Payer: Self-pay

## 2016-05-02 NOTE — Telephone Encounter (Signed)
Pt scheduled for a follow up appt on April 4th in [place].

## 2016-05-02 NOTE — Telephone Encounter (Signed)
-----   Message from Lucilla Lame, MD sent at 05/01/2016 12:32 PM EDT ----- Please have the patient come in for a follow up.

## 2016-05-03 ENCOUNTER — Encounter: Payer: Self-pay | Admitting: Family Medicine

## 2016-05-03 ENCOUNTER — Ambulatory Visit (INDEPENDENT_AMBULATORY_CARE_PROVIDER_SITE_OTHER): Payer: Medicare Other | Admitting: Family Medicine

## 2016-05-03 VITALS — BP 136/60 | HR 57 | Wt 173.0 lb

## 2016-05-03 DIAGNOSIS — I1310 Hypertensive heart and chronic kidney disease without heart failure, with stage 1 through stage 4 chronic kidney disease, or unspecified chronic kidney disease: Secondary | ICD-10-CM

## 2016-05-03 DIAGNOSIS — Z7189 Other specified counseling: Secondary | ICD-10-CM | POA: Insufficient documentation

## 2016-05-03 DIAGNOSIS — E1122 Type 2 diabetes mellitus with diabetic chronic kidney disease: Secondary | ICD-10-CM

## 2016-05-03 DIAGNOSIS — E785 Hyperlipidemia, unspecified: Secondary | ICD-10-CM

## 2016-05-03 DIAGNOSIS — N184 Chronic kidney disease, stage 4 (severe): Secondary | ICD-10-CM

## 2016-05-03 LAB — HEMOGLOBIN A1C
Est. average glucose Bld gHb Est-mCnc: 186 mg/dL
HEMOGLOBIN A1C: 8.1 % — AB (ref 4.8–5.6)

## 2016-05-03 NOTE — Assessment & Plan Note (Signed)
The current medical regimen is effective;  continue present plan and medications.  

## 2016-05-03 NOTE — Progress Notes (Addendum)
BP 136/60 (BP Location: Left Arm)   Pulse (!) 57   Wt 173 lb (78.5 kg)   SpO2 97%   BMI 27.10 kg/m    Subjective:    Patient ID: Ray Lamb, male    DOB: [birth year], 83 y.o.   MRN: [id number]  HPI: Ray Lamb is a 83 y.o. male  Chief Complaint  Patient presents with  . Follow-up  . Diabetes  Patient follow-up doing well blood pressure good control no complaints from medications. Diabetes doing well has some elevated blood sugars in the morning taking medications faithfully. Reviewed notes working with nephrology for renal function is staying stable.   Relevant past medical, surgical, family and social history reviewed and updated as indicated. Interim medical history since our last visit reviewed. Allergies and medications reviewed and updated.  Review of Systems  Constitutional: Negative.   Respiratory: Negative.   Cardiovascular: Negative.     Per HPI unless specifically indicated above     Objective:    BP 136/60 (BP Location: Left Arm)   Pulse (!) 57   Wt 173 lb (78.5 kg)   SpO2 97%   BMI 27.10 kg/m   Wt Readings from Last 3 Encounters:  05/03/16 173 lb (78.5 kg)  04/25/16 180 lb (81.6 kg)  04/12/16 171 lb (77.6 kg)    Physical Exam  Constitutional: He is oriented to person, place, and time. He appears well-developed and well-nourished.  HENT:  Head: Normocephalic and atraumatic.  Eyes: Conjunctivae and EOM are normal.  Neck: Normal range of motion.  Cardiovascular: Normal rate, regular rhythm and normal heart sounds.   Pulmonary/Chest: Effort normal and breath sounds normal.  Musculoskeletal: Normal range of motion.  Neurological: He is alert and oriented to person, place, and time.  Skin: No erythema.  Psychiatric: He has a normal mood and affect. His behavior is normal. Judgment and thought content normal.        Assessment & Plan:   Problem List Items Addressed This Visit      Cardiovascular and Mediastinum   Hypertensive heart and  renal disease    The current medical regimen is effective;  continue present plan and medications.         Endocrine   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 disease (HCC) - Primary    A1c slightly elevated but with risk of hypoglycemia and falling Will leave alone. Patient will do better with diet and exercise.      Relevant Orders   Hemoglobin A1c     Other   Hyperlipidemia    The current medical regimen is effective;  continue present plan and medications.       Relevant Orders   Hemoglobin A1c   Advance care planning    A voluntary discussion about advance care planning including the explanation and discussion of advance directives was extensively discussed  with the patient.  Explanation about the health care proxy and Living will was reviewed and packet with forms with explanation of how to fill them out was given.  During this discussion, the patient was able to identify a health care proxy as wife and son and plans/does  plan to fill out the paperwork required.  Patient was offered a separate [place] visit for further assistance with forms.             Follow up plan: Return in about 3 months (around 08/03/2016) for Hemoglobin A1c, BMP,  Lipids, ALT, AST.

## 2016-05-03 NOTE — Assessment & Plan Note (Signed)
A1c slightly elevated but with risk of hypoglycemia and falling Will leave alone. Patient will do better with diet and exercise.

## 2016-05-03 NOTE — Assessment & Plan Note (Signed)
A voluntary discussion about advance care planning including the explanation and discussion of advance directives was extensively discussed  with the patient.  Explanation about the health care proxy and Living will was reviewed and packet with forms with explanation of how to fill them out was given.  During this discussion, the patient was able to identify a health care proxy as wife and son and plans/does  plan to fill out the paperwork required.  Patient was offered a separate [place] visit for further assistance with forms.

## 2016-05-17 ENCOUNTER — Encounter: Payer: Self-pay | Admitting: Gastroenterology

## 2016-05-17 ENCOUNTER — Ambulatory Visit (INDEPENDENT_AMBULATORY_CARE_PROVIDER_SITE_OTHER): Payer: Medicare Other | Admitting: Gastroenterology

## 2016-05-17 VITALS — BP 122/56 | HR 64 | Temp 97.6°F | Ht 66.0 in | Wt 172.0 lb

## 2016-05-17 DIAGNOSIS — E1122 Type 2 diabetes mellitus with diabetic chronic kidney disease: Secondary | ICD-10-CM | POA: Diagnosis not present

## 2016-05-17 DIAGNOSIS — R600 Localized edema: Secondary | ICD-10-CM | POA: Diagnosis not present

## 2016-05-17 DIAGNOSIS — R1310 Dysphagia, unspecified: Secondary | ICD-10-CM | POA: Diagnosis not present

## 2016-05-17 DIAGNOSIS — I10 Essential (primary) hypertension: Secondary | ICD-10-CM | POA: Diagnosis not present

## 2016-05-17 DIAGNOSIS — N184 Chronic kidney disease, stage 4 (severe): Secondary | ICD-10-CM | POA: Diagnosis not present

## 2016-05-17 DIAGNOSIS — E785 Hyperlipidemia, unspecified: Secondary | ICD-10-CM | POA: Diagnosis not present

## 2016-05-18 NOTE — Progress Notes (Signed)
Primary Care Physician: Golden Pop, MD  Primary Gastroenterologist:  Dr. Lucilla Lame  Chief Complaint  Patient presents with  . Follow up EGD results    HPI: Ray Lamb is a 83 y.o. male here for follow-up after having his EGD. The patient reports that he feels like food is going down slowly but not getting stuck. The patient had biopsies of the stomach that showed atrophic gastritis with some intestinal metaplasia. The patient has been told about his biopsy results and the precancerous nature of these results. The patient denies any abdominal pain nausea vomiting fevers or chills.  Current Outpatient Prescriptions  Medication Sig Dispense Refill  . amLODipine (NORVASC) 5 MG tablet Take 1 tablet (5 mg total) by mouth daily. 90 tablet 4  . aspirin EC 81 MG tablet Take 81 mg by mouth daily.    Marland Kitchen atorvastatin (LIPITOR) 80 MG tablet Take 1 tablet (80 mg total) by mouth at bedtime. 90 tablet 4  . clopidogrel (PLAVIX) 75 MG tablet Take 1 tablet (75 mg total) by mouth daily. 90 tablet 4  . Coenzyme Q10 (CO Q 10) 10 MG CAPS Take by mouth daily.    Marland Kitchen donepezil (ARICEPT) 5 MG tablet Take by mouth.    . ezetimibe (ZETIA) 10 MG tablet Take 1 tablet (10 mg total) by mouth daily. 90 tablet 4  . furosemide (LASIX) 40 MG tablet     . glipiZIDE (GLUCOTROL) 5 MG tablet Take 1 tablet (5 mg total) by mouth daily. 90 tablet 4  . isosorbide mononitrate (IMDUR) 30 MG 24 hr tablet     . JANUVIA 25 MG tablet Take 1 tablet (25 mg total) by mouth daily. 90 tablet 4  . metoprolol succinate (TOPROL-XL) 25 MG 24 hr tablet Take 1 tablet (25 mg total) by mouth daily. 90 tablet 4  . mometasone (ELOCON) 0.1 % lotion APPLY 4 DROPS INTO EACH EAR DAILY FOR 10 DAYS THEN AS NEEDED FOR DRY SKIN AND ITCHING  12  . Multiple Vitamin (MULTIVITAMIN) tablet Take 1 tablet by mouth daily.    Marland Kitchen olmesartan-hydrochlorothiazide (BENICAR HCT) 20-12.5 MG per tablet Take 1 tablet by mouth daily.    . Omega-3 Fatty Acids (FISH OIL)  1000 MG CAPS Take 1,000 mg by mouth daily.    . timolol (TIMOPTIC) 0.5 % ophthalmic solution Place 1 drop into both eyes 2 (two) times daily.     No current facility-administered medications for this visit.     Allergies as of 05/17/2016 - Review Complete 05/17/2016  Allergen Reaction Noted  . Bee venom  09/02/2014  . Ivp dye [iodinated diagnostic agents]  09/02/2014    ROS:  General: Negative for anorexia, weight loss, fever, chills, fatigue, weakness. ENT: Negative for hoarseness, difficulty swallowing , nasal congestion. CV: Negative for chest pain, angina, palpitations, dyspnea on exertion, peripheral edema.  Respiratory: Negative for dyspnea at rest, dyspnea on exertion, cough, sputum, wheezing.  GI: See history of present illness. GU:  Negative for dysuria, hematuria, urinary incontinence, urinary frequency, nocturnal urination.  Endo: Negative for unusual weight change.    Physical Examination:   BP (!) 122/56   Pulse 64   Temp 97.6 F (36.4 C) (Oral)   Ht 5\' 6"  (1.676 m)   Wt 172 lb (78 kg)   BMI 27.76 kg/m   General: Well-nourished, well-developed in no acute distress.  Eyes: No icterus. Conjunctivae pink. Mouth: Oropharyngeal mucosa moist and pink , no lesions erythema or exudate. Lungs: Clear to auscultation bilaterally.  Non-labored. Heart: Regular rate and rhythm, no murmurs rubs or gallops.  Abdomen: Bowel sounds are normal, nontender, nondistended, no hepatosplenomegaly or masses, no abdominal bruits or hernia , no rebound or guarding.   Extremities: No lower extremity edema. No clubbing or deformities. Neuro: Alert and oriented x 3.  Grossly intact. Skin: Warm and dry, no jaundice.   Psych: Alert and cooperative, normal mood and affect.  Labs:    Imaging Studies: No results found.  Assessment and Plan:   Ray Lamb is a 83 y.o. y/o male who comes in for results of his pathology of his stomach biopsies. The patient's biopsies showed him to have  atrophic gastritis with intestinal metaplasia in the stomach. The patient has been told of the low risk of cancer from this but no further workup is needed at this time. The patient has been explained the plan and agrees with it    Lucilla Lame, MD. Ray Lamb   Note: This dictation was prepared with Dragon dictation along with smaller phrase technology. Any transcriptional errors that result from this process are unintentional.

## 2016-05-23 DIAGNOSIS — N184 Chronic kidney disease, stage 4 (severe): Secondary | ICD-10-CM | POA: Diagnosis not present

## 2016-05-23 DIAGNOSIS — R600 Localized edema: Secondary | ICD-10-CM | POA: Diagnosis not present

## 2016-05-23 DIAGNOSIS — I10 Essential (primary) hypertension: Secondary | ICD-10-CM | POA: Diagnosis not present

## 2016-05-23 DIAGNOSIS — E1129 Type 2 diabetes mellitus with other diabetic kidney complication: Secondary | ICD-10-CM | POA: Diagnosis not present

## 2016-05-25 DIAGNOSIS — F0280 Dementia in other diseases classified elsewhere without behavioral disturbance: Secondary | ICD-10-CM | POA: Diagnosis not present

## 2016-05-25 DIAGNOSIS — G309 Alzheimer's disease, unspecified: Secondary | ICD-10-CM | POA: Diagnosis not present

## 2016-05-25 DIAGNOSIS — F0150 Vascular dementia without behavioral disturbance: Secondary | ICD-10-CM | POA: Diagnosis not present

## 2016-05-25 DIAGNOSIS — Z8673 Personal history of transient ischemic attack (TIA), and cerebral infarction without residual deficits: Secondary | ICD-10-CM | POA: Diagnosis not present

## 2016-05-26 DIAGNOSIS — E785 Hyperlipidemia, unspecified: Secondary | ICD-10-CM | POA: Diagnosis not present

## 2016-05-26 DIAGNOSIS — I2510 Atherosclerotic heart disease of native coronary artery without angina pectoris: Secondary | ICD-10-CM | POA: Diagnosis not present

## 2016-05-26 DIAGNOSIS — I10 Essential (primary) hypertension: Secondary | ICD-10-CM | POA: Diagnosis not present

## 2016-07-04 DIAGNOSIS — H40002 Preglaucoma, unspecified, left eye: Secondary | ICD-10-CM | POA: Diagnosis not present

## 2016-07-05 DIAGNOSIS — H40002 Preglaucoma, unspecified, left eye: Secondary | ICD-10-CM | POA: Diagnosis not present

## 2016-07-20 DIAGNOSIS — E1122 Type 2 diabetes mellitus with diabetic chronic kidney disease: Secondary | ICD-10-CM | POA: Diagnosis not present

## 2016-07-20 DIAGNOSIS — R600 Localized edema: Secondary | ICD-10-CM | POA: Diagnosis not present

## 2016-07-20 DIAGNOSIS — E875 Hyperkalemia: Secondary | ICD-10-CM | POA: Diagnosis not present

## 2016-07-20 DIAGNOSIS — N184 Chronic kidney disease, stage 4 (severe): Secondary | ICD-10-CM | POA: Diagnosis not present

## 2016-07-20 DIAGNOSIS — I10 Essential (primary) hypertension: Secondary | ICD-10-CM | POA: Diagnosis not present

## 2016-07-26 DIAGNOSIS — N184 Chronic kidney disease, stage 4 (severe): Secondary | ICD-10-CM | POA: Diagnosis not present

## 2016-07-26 DIAGNOSIS — I10 Essential (primary) hypertension: Secondary | ICD-10-CM | POA: Diagnosis not present

## 2016-07-26 DIAGNOSIS — N179 Acute kidney failure, unspecified: Secondary | ICD-10-CM | POA: Diagnosis not present

## 2016-07-26 DIAGNOSIS — N2581 Secondary hyperparathyroidism of renal origin: Secondary | ICD-10-CM | POA: Diagnosis not present

## 2016-07-26 DIAGNOSIS — E1129 Type 2 diabetes mellitus with other diabetic kidney complication: Secondary | ICD-10-CM | POA: Diagnosis not present

## 2016-07-26 DIAGNOSIS — R600 Localized edema: Secondary | ICD-10-CM | POA: Diagnosis not present

## 2016-08-07 DIAGNOSIS — I10 Essential (primary) hypertension: Secondary | ICD-10-CM | POA: Diagnosis not present

## 2016-08-07 DIAGNOSIS — R600 Localized edema: Secondary | ICD-10-CM | POA: Diagnosis not present

## 2016-08-07 DIAGNOSIS — E1122 Type 2 diabetes mellitus with diabetic chronic kidney disease: Secondary | ICD-10-CM | POA: Diagnosis not present

## 2016-08-07 DIAGNOSIS — N184 Chronic kidney disease, stage 4 (severe): Secondary | ICD-10-CM | POA: Diagnosis not present

## 2016-08-07 DIAGNOSIS — E875 Hyperkalemia: Secondary | ICD-10-CM | POA: Diagnosis not present

## 2016-08-07 DIAGNOSIS — H2512 Age-related nuclear cataract, left eye: Secondary | ICD-10-CM | POA: Diagnosis not present

## 2016-08-08 ENCOUNTER — Encounter: Payer: Self-pay | Admitting: Family Medicine

## 2016-08-08 ENCOUNTER — Ambulatory Visit (INDEPENDENT_AMBULATORY_CARE_PROVIDER_SITE_OTHER): Payer: Medicare Other | Admitting: Family Medicine

## 2016-08-08 VITALS — BP 191/73 | HR 61 | Wt 171.0 lb

## 2016-08-08 DIAGNOSIS — N184 Chronic kidney disease, stage 4 (severe): Secondary | ICD-10-CM | POA: Diagnosis not present

## 2016-08-08 DIAGNOSIS — E785 Hyperlipidemia, unspecified: Secondary | ICD-10-CM

## 2016-08-08 DIAGNOSIS — E1122 Type 2 diabetes mellitus with diabetic chronic kidney disease: Secondary | ICD-10-CM

## 2016-08-08 DIAGNOSIS — I1310 Hypertensive heart and chronic kidney disease without heart failure, with stage 1 through stage 4 chronic kidney disease, or unspecified chronic kidney disease: Secondary | ICD-10-CM

## 2016-08-08 LAB — LP+ALT+AST PICCOLO, WAIVED
ALT (SGPT) PICCOLO, WAIVED: 19 U/L (ref 10–47)
AST (SGOT) Piccolo, Waived: 30 U/L (ref 11–38)
Chol/HDL Ratio Piccolo,Waive: 2.5 mg/dL
Cholesterol Piccolo, Waived: 119 mg/dL (ref <200)
HDL CHOL PICCOLO, WAIVED: 47 mg/dL — AB (ref >59)
LDL CHOL CALC PICCOLO WAIVED: 55 mg/dL (ref <100)
Triglycerides Piccolo,Waived: 86 mg/dL (ref <150)
VLDL Chol Calc Piccolo,Waive: 17 mg/dL (ref <30)

## 2016-08-08 LAB — BAYER DCA HB A1C WAIVED: HB A1C: 7.7 % — AB (ref <7.0)

## 2016-08-08 NOTE — Progress Notes (Signed)
BP (!) 191/73   Pulse 61   Wt 171 lb (77.6 kg)   SpO2 99%   BMI 27.60 kg/m    Subjective:    Patient ID: Ray Lamb, male    DOB: Apr 04, 1930, 83 y.o.   MRN: [id number]  HPI: Ray Lamb is a 83 y.o. male  Blood pressure check. Med check  Reviewed nephrology notes patient had been taking Lasix every day along with Benicar HCT and was dehydrated with some low blood pressure. Nephrology stopped the on losartan temporarily encouraged Lasix use on a when necessary. Patient had previously stopped amlodipine. Patient currently only taking when necessary Lasix and metoprolol 25 mg daily for blood pressure. Patient doesn't feel bad no signs or symptoms of elevated blood pressure. Blood pressure cholesterol seems to be doing well.  Is taking Aricept has some forgetfulness but otherwise doing okay is still driving and doing okay lives with his wife is here at the office visit by himself today.  Relevant past medical, surgical, family and social history reviewed and updated as indicated. Interim medical history since our last visit reviewed. Allergies and medications reviewed and updated.  Review of Systems  Constitutional: Negative.   Respiratory: Negative.   Cardiovascular: Negative.     Per HPI unless specifically indicated above     Objective:    BP (!) 191/73   Pulse 61   Wt 171 lb (77.6 kg)   SpO2 99%   BMI 27.60 kg/m   Wt Readings from Last 3 Encounters:  08/08/16 171 lb (77.6 kg)  05/17/16 172 lb (78 kg)  05/03/16 173 lb (78.5 kg)    Physical Exam  Constitutional: He is oriented to person, place, and time. He appears well-developed and well-nourished.  HENT:  Head: Normocephalic and atraumatic.  Eyes: Conjunctivae and EOM are normal.  Neck: Normal range of motion.  Cardiovascular: Normal rate, regular rhythm and normal heart sounds.   Pulmonary/Chest: Effort normal and breath sounds normal.  Musculoskeletal: Normal range of motion.  Neurological: He is alert  and oriented to person, place, and time.  Skin: No erythema.  Psychiatric: He has a normal mood and affect. His behavior is normal. Judgment and thought content normal.    Results for orders placed or performed in visit on 05/03/16  Hemoglobin A1c  Result Value Ref Range   Hgb A1c MFr Bld 8.1 (H) 4.8 - 5.6 %   Est. average glucose Bld gHb Est-mCnc 186 mg/dL      Assessment & Plan:   Problem List Items Addressed This Visit      Cardiovascular and Mediastinum   Hypertensive heart and renal disease    Reviewed hypertension poor control with stopping medications will continue Lasix when necessary. Restart Benicar 20 HCT 12.5 and recheck blood pressure in 2 weeks or so. May need to restart amlodipine also.       Relevant Orders   Basic metabolic panel   Bayer DCA Hb A1c Waived   LP+ALT+AST Piccolo, Waived     Endocrine   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 disease ([place])    See htn note DM stable good control      Relevant Orders   Basic metabolic panel   Bayer DCA Hb A1c Waived   LP+ALT+AST Piccolo, Waived     Other   Hyperlipidemia - Primary    The current medical regimen is effective;  continue present plan and medications.       Relevant Orders   Basic  metabolic panel   Bayer DCA Hb A1c Waived   LP+ALT+AST Piccolo, Waived       Follow up plan: Return in about 2 weeks (around 08/22/2016) for BP OV.

## 2016-08-08 NOTE — Assessment & Plan Note (Signed)
The current medical regimen is effective;  continue present plan and medications.  

## 2016-08-08 NOTE — Assessment & Plan Note (Addendum)
See htn note DM stable good control

## 2016-08-08 NOTE — Assessment & Plan Note (Signed)
Reviewed hypertension poor control with stopping medications will continue Lasix when necessary. Restart Benicar 20 HCT 12.5 and recheck blood pressure in 2 weeks or so. May need to restart amlodipine also.

## 2016-08-09 ENCOUNTER — Encounter: Payer: Self-pay | Admitting: Family Medicine

## 2016-08-09 LAB — BASIC METABOLIC PANEL
BUN / CREAT RATIO: 18 (ref 10–24)
BUN: 40 mg/dL — ABNORMAL HIGH (ref 8–27)
CALCIUM: 9.1 mg/dL (ref 8.6–10.2)
CHLORIDE: 104 mmol/L (ref 96–106)
CO2: 22 mmol/L (ref 20–29)
Creatinine, Ser: 2.2 mg/dL — ABNORMAL HIGH (ref 0.76–1.27)
GFR, EST AFRICAN AMERICAN: 31 mL/min/{1.73_m2} — AB (ref >59)
GFR, EST NON AFRICAN AMERICAN: 27 mL/min/{1.73_m2} — AB (ref >59)
Glucose: 146 mg/dL — ABNORMAL HIGH (ref 65–99)
POTASSIUM: 4.9 mmol/L (ref 3.5–5.2)
SODIUM: 140 mmol/L (ref 134–144)

## 2016-08-23 ENCOUNTER — Ambulatory Visit (INDEPENDENT_AMBULATORY_CARE_PROVIDER_SITE_OTHER): Payer: Medicare Other | Admitting: Family Medicine

## 2016-08-23 ENCOUNTER — Encounter: Payer: Self-pay | Admitting: Family Medicine

## 2016-08-23 VITALS — BP 144/64 | HR 67 | Wt 170.0 lb

## 2016-08-23 DIAGNOSIS — N184 Chronic kidney disease, stage 4 (severe): Secondary | ICD-10-CM

## 2016-08-23 DIAGNOSIS — I1310 Hypertensive heart and chronic kidney disease without heart failure, with stage 1 through stage 4 chronic kidney disease, or unspecified chronic kidney disease: Secondary | ICD-10-CM | POA: Diagnosis not present

## 2016-08-23 DIAGNOSIS — E1122 Type 2 diabetes mellitus with diabetic chronic kidney disease: Secondary | ICD-10-CM

## 2016-08-23 NOTE — Assessment & Plan Note (Signed)
Check BMP today to assess with restarting blood pressure medicines Benicar HCT

## 2016-08-23 NOTE — Addendum Note (Signed)
Addended by: Golden Pop A on: 08/23/2016 09:11 AM   Modules accepted: Orders

## 2016-08-23 NOTE — Progress Notes (Signed)
BP (!) 144/64 (BP Location: Left Arm)   Pulse 67   Wt 170 lb (77.1 kg)   SpO2 99%   BMI 27.44 kg/m    Subjective:    Patient ID: Ray Lamb, male    DOB: 1930-09-01, 83 y.o.   MRN: [id number]  HPI: CHRISTIEN FRANKL is a 83 y.o. male  Chief Complaint  Patient presents with  . Follow-up  . Hypertension  Follow-up blood pressure patient's restarted Benicar 20/12.5 HCT with no signs or symptoms of issues. All in all doing well. Blood pressure on home checking has improved significantly. Not taking amlodipine. CK D apparently stable with good report from last visit we will recheck again today to assess change with new medication.  Relevant past medical, surgical, family and social history reviewed and updated as indicated. Interim medical history since our last visit reviewed. Allergies and medications reviewed and updated.  Review of Systems  Constitutional: Negative.   Respiratory: Negative.   Cardiovascular: Negative.     Per HPI unless specifically indicated above     Objective:    BP (!) 144/64 (BP Location: Left Arm)   Pulse 67   Wt 170 lb (77.1 kg)   SpO2 99%   BMI 27.44 kg/m   Wt Readings from Last 3 Encounters:  08/23/16 170 lb (77.1 kg)  08/08/16 171 lb (77.6 kg)  05/17/16 172 lb (78 kg)    Physical Exam  Constitutional: He is oriented to person, place, and time. He appears well-developed and well-nourished.  HENT:  Head: Normocephalic and atraumatic.  Eyes: Conjunctivae and EOM are normal.  Neck: Normal range of motion.  Cardiovascular: Normal rate, regular rhythm and normal heart sounds.   Pulmonary/Chest: Effort normal and breath sounds normal.  Musculoskeletal: Normal range of motion.  Neurological: He is alert and oriented to person, place, and time.  Skin: No erythema.  Psychiatric: He has a normal mood and affect. His behavior is normal. Judgment and thought content normal.    Results for orders placed or performed in visit on 54/65/68  Basic  metabolic panel  Result Value Ref Range   Glucose 146 (H) 65 - 99 mg/dL   BUN 40 (H) 8 - 27 mg/dL   Creatinine, Ser 2.20 (H) 0.76 - 1.27 mg/dL   GFR calc non Af Amer 27 (L) >59 mL/min/1.73   GFR calc Af Amer 31 (L) >59 mL/min/1.73   BUN/Creatinine Ratio 18 10 - 24   Sodium 140 134 - 144 mmol/L   Potassium 4.9 3.5 - 5.2 mmol/L   Chloride 104 96 - 106 mmol/L   CO2 22 20 - 29 mmol/L   Calcium 9.1 8.6 - 10.2 mg/dL  Bayer DCA Hb A1c Waived  Result Value Ref Range   Bayer DCA Hb A1c Waived 7.7 (H) <7.0 %  LP+ALT+AST Piccolo, Waived  Result Value Ref Range   ALT (SGPT) Piccolo, Waived 19 10 - 47 U/L   AST (SGOT) Piccolo, Waived 30 11 - 38 U/L   Cholesterol Piccolo, Waived 119 <200 mg/dL   HDL Chol Piccolo, Waived 47 (L) >59 mg/dL   Triglycerides Piccolo,Waived 86 <150 mg/dL   Chol/HDL Ratio Piccolo,Waive 2.5 mg/dL   LDL Chol Calc Piccolo Waived 55 <100 mg/dL   VLDL Chol Calc Piccolo,Waive 17 <30 mg/dL      Assessment & Plan:   Problem List Items Addressed This Visit      Cardiovascular and Mediastinum   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  Hypertension much improved we'll continue current medication for now pending BMP report.        Endocrine   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 disease (HCC)    Check BMP today to assess with restarting blood pressure medicines Benicar HCT          Follow up plan: Return for October, Hemoglobin A1c, BMP,  Lipids, ALT, AST.

## 2016-08-23 NOTE — Assessment & Plan Note (Signed)
Hypertension much improved we'll continue current medication for now pending BMP report.

## 2016-08-24 DIAGNOSIS — E785 Hyperlipidemia, unspecified: Secondary | ICD-10-CM | POA: Diagnosis not present

## 2016-08-24 DIAGNOSIS — H2512 Age-related nuclear cataract, left eye: Secondary | ICD-10-CM | POA: Diagnosis not present

## 2016-08-24 LAB — BASIC METABOLIC PANEL
BUN / CREAT RATIO: 19 (ref 10–24)
BUN: 45 mg/dL — ABNORMAL HIGH (ref 8–27)
CALCIUM: 9.1 mg/dL (ref 8.6–10.2)
CHLORIDE: 99 mmol/L (ref 96–106)
CO2: 23 mmol/L (ref 20–29)
Creatinine, Ser: 2.43 mg/dL — ABNORMAL HIGH (ref 0.76–1.27)
GFR, EST AFRICAN AMERICAN: 27 mL/min/{1.73_m2} — AB (ref >59)
GFR, EST NON AFRICAN AMERICAN: 24 mL/min/{1.73_m2} — AB (ref >59)
Glucose: 170 mg/dL — ABNORMAL HIGH (ref 65–99)
POTASSIUM: 5 mmol/L (ref 3.5–5.2)
Sodium: 138 mmol/L (ref 134–144)

## 2016-08-27 ENCOUNTER — Encounter: Payer: Self-pay | Admitting: Family Medicine

## 2016-08-30 ENCOUNTER — Encounter: Payer: Self-pay | Admitting: *Deleted

## 2016-08-31 DIAGNOSIS — I25810 Atherosclerosis of coronary artery bypass graft(s) without angina pectoris: Secondary | ICD-10-CM | POA: Diagnosis not present

## 2016-08-31 DIAGNOSIS — E785 Hyperlipidemia, unspecified: Secondary | ICD-10-CM | POA: Diagnosis not present

## 2016-08-31 DIAGNOSIS — I509 Heart failure, unspecified: Secondary | ICD-10-CM | POA: Diagnosis not present

## 2016-08-31 DIAGNOSIS — R0602 Shortness of breath: Secondary | ICD-10-CM | POA: Diagnosis not present

## 2016-08-31 DIAGNOSIS — I340 Nonrheumatic mitral (valve) insufficiency: Secondary | ICD-10-CM | POA: Diagnosis not present

## 2016-08-31 DIAGNOSIS — I2510 Atherosclerotic heart disease of native coronary artery without angina pectoris: Secondary | ICD-10-CM | POA: Diagnosis not present

## 2016-08-31 DIAGNOSIS — I10 Essential (primary) hypertension: Secondary | ICD-10-CM | POA: Diagnosis not present

## 2016-09-05 ENCOUNTER — Encounter: Payer: Self-pay | Admitting: *Deleted

## 2016-09-05 ENCOUNTER — Ambulatory Visit: Payer: Medicare Other | Admitting: Anesthesiology

## 2016-09-05 ENCOUNTER — Encounter
Admission: RE | Disposition: A | Discharge status: Home / Self Care | Payer: Self-pay | Source: Ambulatory Visit | Attending: Ophthalmology

## 2016-09-05 ENCOUNTER — Ambulatory Visit
Admission: RE | Admit: 2016-09-05 | Discharge: 2016-09-05 | Disposition: A | Discharge status: Home / Self Care | Payer: Medicare Other | Source: Ambulatory Visit | Attending: Ophthalmology | Admitting: Ophthalmology

## 2016-09-05 DIAGNOSIS — Z7902 Long term (current) use of antithrombotics/antiplatelets: Secondary | ICD-10-CM | POA: Diagnosis not present

## 2016-09-05 DIAGNOSIS — N184 Chronic kidney disease, stage 4 (severe): Secondary | ICD-10-CM | POA: Insufficient documentation

## 2016-09-05 DIAGNOSIS — Z951 Presence of aortocoronary bypass graft: Secondary | ICD-10-CM | POA: Diagnosis not present

## 2016-09-05 DIAGNOSIS — H2512 Age-related nuclear cataract, left eye: Secondary | ICD-10-CM | POA: Diagnosis not present

## 2016-09-05 DIAGNOSIS — Z7982 Long term (current) use of aspirin: Secondary | ICD-10-CM | POA: Diagnosis not present

## 2016-09-05 DIAGNOSIS — E119 Type 2 diabetes mellitus without complications: Secondary | ICD-10-CM | POA: Diagnosis not present

## 2016-09-05 DIAGNOSIS — I2510 Atherosclerotic heart disease of native coronary artery without angina pectoris: Secondary | ICD-10-CM | POA: Insufficient documentation

## 2016-09-05 DIAGNOSIS — Z7984 Long term (current) use of oral hypoglycemic drugs: Secondary | ICD-10-CM | POA: Insufficient documentation

## 2016-09-05 DIAGNOSIS — K449 Diaphragmatic hernia without obstruction or gangrene: Secondary | ICD-10-CM | POA: Diagnosis not present

## 2016-09-05 DIAGNOSIS — Z87891 Personal history of nicotine dependence: Secondary | ICD-10-CM | POA: Insufficient documentation

## 2016-09-05 DIAGNOSIS — E1122 Type 2 diabetes mellitus with diabetic chronic kidney disease: Secondary | ICD-10-CM | POA: Insufficient documentation

## 2016-09-05 DIAGNOSIS — Z79899 Other long term (current) drug therapy: Secondary | ICD-10-CM | POA: Insufficient documentation

## 2016-09-05 DIAGNOSIS — I129 Hypertensive chronic kidney disease with stage 1 through stage 4 chronic kidney disease, or unspecified chronic kidney disease: Secondary | ICD-10-CM | POA: Insufficient documentation

## 2016-09-05 DIAGNOSIS — Z91041 Radiographic dye allergy status: Secondary | ICD-10-CM | POA: Diagnosis not present

## 2016-09-05 DIAGNOSIS — Z96651 Presence of right artificial knee joint: Secondary | ICD-10-CM | POA: Diagnosis not present

## 2016-09-05 DIAGNOSIS — I10 Essential (primary) hypertension: Secondary | ICD-10-CM | POA: Diagnosis not present

## 2016-09-05 HISTORY — DX: Cardiac murmur, unspecified: R01.1

## 2016-09-05 HISTORY — DX: Edema, unspecified: R60.9

## 2016-09-05 HISTORY — DX: Cerebral infarction, unspecified: I63.9

## 2016-09-05 HISTORY — DX: Personal history of other diseases of the digestive system: Z87.19

## 2016-09-05 HISTORY — PX: CATARACT EXTRACTION W/PHACO: SHX586

## 2016-09-05 LAB — GLUCOSE, CAPILLARY: GLUCOSE-CAPILLARY: 140 mg/dL — AB (ref 65–99)

## 2016-09-05 SURGERY — PHACOEMULSIFICATION, CATARACT, WITH IOL INSERTION
Anesthesia: Monitor Anesthesia Care | Site: Eye | Laterality: Left | Wound class: Clean

## 2016-09-05 MED ORDER — EPINEPHRINE PF 1 MG/ML IJ SOLN
INTRAMUSCULAR | Status: AC
  Filled 2016-09-05: qty 1

## 2016-09-05 MED ORDER — EPINEPHRINE PF 1 MG/ML IJ SOLN
INTRAOCULAR | Status: DC | PRN
  Administered 2016-09-05: 07:00:00 via OPHTHALMIC

## 2016-09-05 MED ORDER — POVIDONE-IODINE 5 % OP SOLN
OPHTHALMIC | Status: DC | PRN
  Administered 2016-09-05: 1 via OPHTHALMIC

## 2016-09-05 MED ORDER — FENTANYL CITRATE (PF) 100 MCG/2ML IJ SOLN
INTRAMUSCULAR | Status: DC | PRN
  Administered 2016-09-05: 10 ug via INTRAVENOUS
  Administered 2016-09-05: 15 ug via INTRAVENOUS

## 2016-09-05 MED ORDER — NA CHONDROIT SULF-NA HYALURON 40-17 MG/ML IO SOLN
INTRAOCULAR | Status: AC
  Filled 2016-09-05: qty 1

## 2016-09-05 MED ORDER — ARMC OPHTHALMIC DILATING DROPS
1.0000 "application " | OPHTHALMIC | Status: AC
  Administered 2016-09-05 (×3): 1 via OPHTHALMIC

## 2016-09-05 MED ORDER — SODIUM CHLORIDE 0.9 % IV SOLN
INTRAVENOUS | Status: DC
  Administered 2016-09-05: 07:00:00 via INTRAVENOUS

## 2016-09-05 MED ORDER — MOXIFLOXACIN HCL 0.5 % OP SOLN: 1.0000 [drp] | OPHTHALMIC | Status: DC | PRN

## 2016-09-05 MED ORDER — NA CHONDROIT SULF-NA HYALURON 40-17 MG/ML IO SOLN
INTRAOCULAR | Status: DC | PRN
  Administered 2016-09-05: 1 mL via INTRAOCULAR

## 2016-09-05 MED ORDER — POVIDONE-IODINE 5 % OP SOLN
OPHTHALMIC | Status: AC
  Filled 2016-09-05: qty 30

## 2016-09-05 MED ORDER — BSS IO SOLN
INTRAOCULAR | Status: DC | PRN
  Administered 2016-09-05: 4 mL via OPHTHALMIC

## 2016-09-05 MED ORDER — CARBACHOL 0.01 % IO SOLN
INTRAOCULAR | Status: DC | PRN
  Administered 2016-09-05: 0.5 mL via INTRAOCULAR

## 2016-09-05 MED ORDER — LIDOCAINE HCL (PF) 4 % IJ SOLN
INTRAMUSCULAR | Status: AC
  Filled 2016-09-05: qty 5

## 2016-09-05 MED ORDER — FENTANYL CITRATE (PF) 100 MCG/2ML IJ SOLN
INTRAMUSCULAR | Status: AC
  Filled 2016-09-05: qty 2

## 2016-09-05 MED ORDER — MOXIFLOXACIN HCL 0.5 % OP SOLN
OPHTHALMIC | Status: DC | PRN
  Administered 2016-09-05: 0.2 mL via OPHTHALMIC

## 2016-09-05 SURGICAL SUPPLY — 16 items
GLOVE BIO SURGEON STRL SZ8 (GLOVE) ×2 IMPLANT
GLOVE BIOGEL M 6.5 STRL (GLOVE) ×2 IMPLANT
GLOVE SURG LX 8.0 MICRO (GLOVE) ×1
GLOVE SURG LX STRL 8.0 MICRO (GLOVE) ×1 IMPLANT
GOWN STRL REUS W/ TWL LRG LVL3 (GOWN DISPOSABLE) ×2 IMPLANT
GOWN STRL REUS W/TWL LRG LVL3 (GOWN DISPOSABLE) ×2
LABEL CATARACT MEDS ST (LABEL) ×2 IMPLANT
LENS IOL TECNIS ITEC 14.0 (Intraocular Lens) ×2 IMPLANT
PACK CATARACT (MISCELLANEOUS) ×2 IMPLANT
PACK CATARACT BRASINGTON LX (MISCELLANEOUS) ×2 IMPLANT
PACK EYE AFTER SURG (MISCELLANEOUS) ×2 IMPLANT
SOL BSS BAG (MISCELLANEOUS) ×2
SOLUTION BSS BAG (MISCELLANEOUS) ×1 IMPLANT
SYR 5ML LL (SYRINGE) ×2 IMPLANT
WATER STERILE IRR 250ML POUR (IV SOLUTION) ×2 IMPLANT
WIPE NON LINTING 3.25X3.25 (MISCELLANEOUS) ×2 IMPLANT

## 2016-09-05 NOTE — Transfer of Care (Signed)
Immediate Anesthesia Transfer of Care Note  Patient: Ray Lamb  Procedure(s) Performed: Procedure(s) with comments: CATARACT EXTRACTION PHACO AND INTRAOCULAR LENS PLACEMENT (IOC) (Left) - [place] 00:43.1 AP% 22.6 CDE 9.77 Fluid Pack lot #[id number]  Patient Location: PACU  Anesthesia Type:MAC  Level of Consciousness: awake, alert  and oriented  Airway & Oxygen Therapy: Patient Spontanous Breathing  Post-op Assessment: Report given to RN and Post -op Vital signs reviewed and stable  Post vital signs: Reviewed and stable  Last Vitals:  Vitals:   09/05/16 0613  BP: (!) 169/64  Pulse: 60  Resp: 20  Temp: (!) 36.1 C    Last Pain:  Vitals:   09/05/16 0613  TempSrc: Tympanic         Complications: No apparent anesthesia complications

## 2016-09-05 NOTE — Anesthesia Postprocedure Evaluation (Signed)
Anesthesia Post Note  Patient: Ray Lamb  Procedure(s) Performed: Procedure(s) (LRB): CATARACT EXTRACTION PHACO AND INTRAOCULAR LENS PLACEMENT (IOC) (Left)  Patient location during evaluation: PACU Anesthesia Type: MAC Level of consciousness: awake and alert Pain management: pain level controlled Vital Signs Assessment: post-procedure vital signs reviewed and stable Respiratory status: spontaneous breathing, nonlabored ventilation, respiratory function stable and patient connected to nasal cannula oxygen Cardiovascular status: stable and blood pressure returned to baseline Anesthetic complications: no     Last Vitals:  Vitals:   09/05/16 0746 09/05/16 0748  BP: (!) 137/51 (!) 137/51  Pulse: (!) 51 (!) 51  Resp:  18  Temp: (!) 36 C     Last Pain:  Vitals:   09/05/16 0748  TempSrc: Tympanic                 Darlyne Russian

## 2016-09-05 NOTE — Anesthesia Preprocedure Evaluation (Signed)
Anesthesia Evaluation  Patient identified by MRN, date of birth, ID band Patient awake    Reviewed: Allergy & Precautions, NPO status , Patient's Chart, lab work & pertinent test results  History of Anesthesia Complications Negative for: history of anesthetic complications  Airway Mallampati: II  TM Distance: >3 FB Neck ROM: Full    Dental  (+) Poor Dentition   Pulmonary neg sleep apnea, neg COPD, former smoker,    breath sounds clear to auscultation- rhonchi (-) wheezing      Cardiovascular hypertension, Pt. on medications + CAD, + Past MI and + CABG (2003)   Rhythm:Regular Rate:Normal - Systolic murmurs and - Diastolic murmurs    Neuro/Psych CVA, No Residual Symptoms negative psych ROS   GI/Hepatic Neg liver ROS, hiatal hernia, GERD  ,  Endo/Other  diabetes, Oral Hypoglycemic Agents  Renal/GU Renal InsufficiencyRenal disease     Musculoskeletal negative musculoskeletal ROS (+)   Abdominal (+) - obese,   Peds  Hematology  (+) anemia ,   Anesthesia Other Findings Past Medical History: No date: Anemia No date: Chronic kidney disease No date: Coronary atherosclerosis No date: Diabetes mellitus without complication (HCC) No date: Edema     Comment:  ankle No date: GERD (gastroesophageal reflux disease) No date: Glaucoma No date: Heart murmur No date: History of hiatal hernia No date: Hyperlipidemia No date: Hypertension No date: Mini stroke (HCC) No date: Mitral insufficiency No date: Myocardial infarction (HCC)     Comment:  mild, heart cath done no stents needed No date: Stroke [facility])   Reproductive/Obstetrics                             Anesthesia Physical Anesthesia Plan  ASA: III  Anesthesia Plan: MAC   Post-op Pain Management:    Induction: Intravenous  PONV Risk Score and Plan: 1 and Midazolam  Airway Management Planned: Natural Airway  Additional Equipment:    Intra-op Plan:   Post-operative Plan:   Informed Consent: I have reviewed the patients History and Physical, chart, labs and discussed the procedure including the risks, benefits and alternatives for the proposed anesthesia with the patient or authorized representative who has indicated his/her understanding and acceptance.     Plan Discussed with: CRNA and Anesthesiologist  Anesthesia Plan Comments:         Anesthesia Quick Evaluation

## 2016-09-05 NOTE — Op Note (Signed)
PREOPERATIVE DIAGNOSIS:  Nuclear sclerotic cataract of the left eye.   POSTOPERATIVE DIAGNOSIS:  Nuclear sclerotic cataract of the left eye.   OPERATIVE PROCEDURE: Procedure(s): CATARACT EXTRACTION PHACO AND INTRAOCULAR LENS PLACEMENT (IOC)   SURGEON:  Birder Robson, MD.   ANESTHESIA:  Anesthesiologist: Emmie Niemann, MD CRNA: Darlyne Russian, CRNA  1.      Managed anesthesia care. 2.     0.40ml of Shugarcaine was instilled following the paracentesis   COMPLICATIONS:  None.   TECHNIQUE:   Stop and chop   DESCRIPTION OF PROCEDURE:  The patient was examined and consented in the preoperative holding area where the aforementioned topical anesthesia was applied to the left eye and then brought back to the Operating Room where the left eye was prepped and draped in the usual sterile ophthalmic fashion and a lid speculum was placed. A paracentesis was created with the side port blade and the anterior chamber was filled with viscoelastic. A near clear corneal incision was performed with the steel keratome. A continuous curvilinear capsulorrhexis was performed with a cystotome followed by the capsulorrhexis forceps. Hydrodissection and hydrodelineation were carried out with BSS on a blunt cannula. The lens was removed in a stop and chop  technique and the remaining cortical material was removed with the irrigation-aspiration handpiece. The capsular bag was inflated with viscoelastic and the Technis [id number] lens was placed in the capsular bag without complication. The remaining viscoelastic was removed from the eye with the irrigation-aspiration handpiece. The wounds were hydrated. The anterior chamber was flushed with Miostat and the eye was inflated to physiologic pressure. 0.91ml Vigamox was placed in the anterior chamber. The wounds were found to be water tight. The eye was dressed with Vigamox. The patient was given protective glasses to wear throughout the day and a shield with which to sleep  tonight. The patient was also given drops with which to begin a drop regimen today and will follow-up with me in one day.  Implant Name Type Inv. Item Serial No. Manufacturer Lot No. LRB No. Used  LENS IOL DIOP 14.0 - [id number] Intraocular Lens LENS IOL DIOP 14.0 [id number]   Left 1    Procedure(s) with comments: CATARACT EXTRACTION PHACO AND INTRAOCULAR LENS PLACEMENT (IOC) (Left) - [place] 00:43.1 AP% 22.6 CDE 9.77 Fluid Pack lot #[id number]  Electronically signed: Kaelyn Innocent [place] 09/05/2016 7:45 AM

## 2016-09-05 NOTE — Discharge Instructions (Signed)
Eye Surgery Discharge Instructions  Expect mild scratchy sensation or mild soreness. DO NOT RUB YOUR EYE!  The day of surgery:  Minimal physical activity, but bed rest is not required  No reading, computer work, or close hand work  No bending, lifting, or straining.  May watch TV  For 24 hours:  No driving, legal decisions, or alcoholic beverages  Safety precautions  Eat anything you prefer: It is better to start with liquids, then soup then solid foods.  _____ Eye patch should be worn until postoperative exam tomorrow.  ____ Solar shield eyeglasses should be worn for comfort in the sunlight/patch while sleeping  Resume all regular medications including aspirin or Coumadin if these were discontinued prior to surgery. You may shower, bathe, shave, or wash your hair. Tylenol may be taken for mild discomfort.  Call your doctor if you experience significant pain, nausea, or vomiting, fever > 101 or other signs of infection. [phone] or [phone] Specific instructions:  Follow-up Information    Birder Robson, MD Follow up.   Specialty:  Ophthalmology Why:  July 25 at 10:25am Contact information: [place] [address] [phone]         AMBULATORY SURGERY  DISCHARGE INSTRUCTIONS   1) The drugs that you were given will stay in your system until tomorrow so for the next 24 hours you should not:  A) Drive an automobile B) Make any legal decisions C) Drink any alcoholic beverage   2) You may resume regular meals tomorrow.  Today it is better to start with liquids and gradually work up to solid foods.  You may eat anything you prefer, but it is better to start with liquids, then soup and crackers, and gradually work up to solid foods.   3) Please notify your doctor immediately if you have any unusual bleeding, trouble breathing, redness and pain at the surgery site, drainage, fever, or pain not relieved by  medication.    4) Additional Instructions:        Please contact your physician with any problems or Same Day Surgery at [phone], Monday through Friday 6 am to 4 pm, or [place] [facility] number at [phone].

## 2016-09-05 NOTE — Anesthesia Post-op Follow-up Note (Cosign Needed)
Anesthesia QCDR form completed.        

## 2016-09-05 NOTE — H&P (Signed)
All labs reviewed. Abnormal studies sent to patients PCP when indicated.  Previous H&P reviewed, patient examined, there are NO CHANGES.  [name]7/24/20187:09 AM

## 2016-09-05 NOTE — Anesthesia Procedure Notes (Signed)
Procedure Name: MAC Date/Time: 09/05/2016 7:22 AM Performed by: Darlyne Russian Pre-anesthesia Checklist: Patient identified, Emergency Drugs available, Suction available, Patient being monitored and Timeout performed Oxygen Delivery Method: Nasal cannula Placement Confirmation: positive ETCO2

## 2016-09-13 DIAGNOSIS — L570 Actinic keratosis: Secondary | ICD-10-CM | POA: Diagnosis not present

## 2016-09-13 DIAGNOSIS — X32XXXA Exposure to sunlight, initial encounter: Secondary | ICD-10-CM | POA: Diagnosis not present

## 2016-09-13 DIAGNOSIS — D044 Carcinoma in situ of skin of scalp and neck: Secondary | ICD-10-CM | POA: Diagnosis not present

## 2016-09-13 DIAGNOSIS — D485 Neoplasm of uncertain behavior of skin: Secondary | ICD-10-CM | POA: Diagnosis not present

## 2016-09-13 DIAGNOSIS — L821 Other seborrheic keratosis: Secondary | ICD-10-CM | POA: Diagnosis not present

## 2016-09-19 DIAGNOSIS — H2511 Age-related nuclear cataract, right eye: Secondary | ICD-10-CM | POA: Diagnosis not present

## 2016-09-20 DIAGNOSIS — N184 Chronic kidney disease, stage 4 (severe): Secondary | ICD-10-CM | POA: Diagnosis not present

## 2016-09-20 DIAGNOSIS — I10 Essential (primary) hypertension: Secondary | ICD-10-CM | POA: Diagnosis not present

## 2016-09-20 DIAGNOSIS — E1122 Type 2 diabetes mellitus with diabetic chronic kidney disease: Secondary | ICD-10-CM | POA: Diagnosis not present

## 2016-09-20 DIAGNOSIS — R600 Localized edema: Secondary | ICD-10-CM | POA: Diagnosis not present

## 2016-09-20 DIAGNOSIS — E875 Hyperkalemia: Secondary | ICD-10-CM | POA: Diagnosis not present

## 2016-09-25 DIAGNOSIS — E1129 Type 2 diabetes mellitus with other diabetic kidney complication: Secondary | ICD-10-CM | POA: Diagnosis not present

## 2016-09-25 DIAGNOSIS — N184 Chronic kidney disease, stage 4 (severe): Secondary | ICD-10-CM | POA: Diagnosis not present

## 2016-09-25 DIAGNOSIS — R600 Localized edema: Secondary | ICD-10-CM | POA: Diagnosis not present

## 2016-09-25 DIAGNOSIS — E875 Hyperkalemia: Secondary | ICD-10-CM | POA: Diagnosis not present

## 2016-09-26 ENCOUNTER — Ambulatory Visit: Payer: Medicare Other | Admitting: Anesthesiology

## 2016-09-26 ENCOUNTER — Ambulatory Visit
Admission: RE | Admit: 2016-09-26 | Discharge: 2016-09-26 | Disposition: A | Discharge status: Home / Self Care | Payer: Medicare Other | Source: Ambulatory Visit | Attending: Ophthalmology | Admitting: Ophthalmology

## 2016-09-26 ENCOUNTER — Encounter
Admission: RE | Disposition: A | Discharge status: Home / Self Care | Payer: Self-pay | Source: Ambulatory Visit | Attending: Ophthalmology

## 2016-09-26 ENCOUNTER — Encounter: Payer: Self-pay | Admitting: *Deleted

## 2016-09-26 DIAGNOSIS — E1122 Type 2 diabetes mellitus with diabetic chronic kidney disease: Secondary | ICD-10-CM | POA: Diagnosis not present

## 2016-09-26 DIAGNOSIS — Z79899 Other long term (current) drug therapy: Secondary | ICD-10-CM | POA: Insufficient documentation

## 2016-09-26 DIAGNOSIS — E119 Type 2 diabetes mellitus without complications: Secondary | ICD-10-CM | POA: Diagnosis not present

## 2016-09-26 DIAGNOSIS — K219 Gastro-esophageal reflux disease without esophagitis: Secondary | ICD-10-CM | POA: Insufficient documentation

## 2016-09-26 DIAGNOSIS — I10 Essential (primary) hypertension: Secondary | ICD-10-CM | POA: Diagnosis not present

## 2016-09-26 DIAGNOSIS — Z951 Presence of aortocoronary bypass graft: Secondary | ICD-10-CM | POA: Diagnosis not present

## 2016-09-26 DIAGNOSIS — Z8673 Personal history of transient ischemic attack (TIA), and cerebral infarction without residual deficits: Secondary | ICD-10-CM | POA: Insufficient documentation

## 2016-09-26 DIAGNOSIS — I2510 Atherosclerotic heart disease of native coronary artery without angina pectoris: Secondary | ICD-10-CM | POA: Diagnosis not present

## 2016-09-26 DIAGNOSIS — H2511 Age-related nuclear cataract, right eye: Secondary | ICD-10-CM | POA: Insufficient documentation

## 2016-09-26 DIAGNOSIS — Z87891 Personal history of nicotine dependence: Secondary | ICD-10-CM | POA: Insufficient documentation

## 2016-09-26 DIAGNOSIS — I129 Hypertensive chronic kidney disease with stage 1 through stage 4 chronic kidney disease, or unspecified chronic kidney disease: Secondary | ICD-10-CM | POA: Diagnosis not present

## 2016-09-26 DIAGNOSIS — Z7984 Long term (current) use of oral hypoglycemic drugs: Secondary | ICD-10-CM | POA: Diagnosis not present

## 2016-09-26 DIAGNOSIS — Z7982 Long term (current) use of aspirin: Secondary | ICD-10-CM | POA: Diagnosis not present

## 2016-09-26 DIAGNOSIS — N184 Chronic kidney disease, stage 4 (severe): Secondary | ICD-10-CM | POA: Diagnosis not present

## 2016-09-26 HISTORY — DX: Unspecified hearing loss, unspecified ear: H91.90

## 2016-09-26 HISTORY — PX: CATARACT EXTRACTION W/PHACO: SHX586

## 2016-09-26 LAB — GLUCOSE, CAPILLARY: GLUCOSE-CAPILLARY: 154 mg/dL — AB (ref 65–99)

## 2016-09-26 SURGERY — PHACOEMULSIFICATION, CATARACT, WITH IOL INSERTION
Anesthesia: Monitor Anesthesia Care | Site: Eye | Laterality: Right | Wound class: Clean

## 2016-09-26 MED ORDER — ARMC OPHTHALMIC DILATING DROPS
OPHTHALMIC | Status: AC
  Administered 2016-09-26: 1 via OPHTHALMIC
  Filled 2016-09-26: qty 0.4

## 2016-09-26 MED ORDER — LIDOCAINE HCL (PF) 4 % IJ SOLN
INTRAMUSCULAR | Status: DC | PRN
  Administered 2016-09-26: 2 mL via OPHTHALMIC

## 2016-09-26 MED ORDER — NA CHONDROIT SULF-NA HYALURON 40-17 MG/ML IO SOLN
INTRAOCULAR | Status: DC | PRN
  Administered 2016-09-26: 1 mL via INTRAOCULAR

## 2016-09-26 MED ORDER — CARBACHOL 0.01 % IO SOLN
INTRAOCULAR | Status: DC | PRN
  Administered 2016-09-26: .5 mL via INTRAOCULAR

## 2016-09-26 MED ORDER — FENTANYL CITRATE (PF) 100 MCG/2ML IJ SOLN
INTRAMUSCULAR | Status: AC
  Filled 2016-09-26: qty 2

## 2016-09-26 MED ORDER — MOXIFLOXACIN HCL 0.5 % OP SOLN
OPHTHALMIC | Status: AC
  Filled 2016-09-26: qty 3

## 2016-09-26 MED ORDER — EPINEPHRINE PF 1 MG/ML IJ SOLN
INTRAOCULAR | Status: DC | PRN
  Administered 2016-09-26: 1 mL via OPHTHALMIC

## 2016-09-26 MED ORDER — FENTANYL CITRATE (PF) 100 MCG/2ML IJ SOLN
INTRAMUSCULAR | Status: DC | PRN
  Administered 2016-09-26: 25 ug via INTRAVENOUS

## 2016-09-26 MED ORDER — SODIUM CHLORIDE 0.9 % IV SOLN
INTRAVENOUS | Status: DC
  Administered 2016-09-26: 08:00:00 via INTRAVENOUS

## 2016-09-26 MED ORDER — MOXIFLOXACIN HCL 0.5 % OP SOLN
1.0000 [drp] | OPHTHALMIC | Status: DC | PRN
  Administered 2016-09-26: .2 mL via OPHTHALMIC

## 2016-09-26 MED ORDER — POVIDONE-IODINE 5 % OP SOLN
OPHTHALMIC | Status: DC | PRN
  Administered 2016-09-26: 1 via OPHTHALMIC

## 2016-09-26 MED ORDER — ARMC OPHTHALMIC DILATING DROPS
1.0000 "application " | OPHTHALMIC | Status: AC | PRN
  Administered 2016-09-26 (×3): 1 via OPHTHALMIC

## 2016-09-26 SURGICAL SUPPLY — 16 items
GLOVE BIO SURGEON STRL SZ8 (GLOVE) ×2 IMPLANT
GLOVE BIOGEL M 6.5 STRL (GLOVE) ×2 IMPLANT
GLOVE SURG LX 8.0 MICRO (GLOVE) ×1
GLOVE SURG LX STRL 8.0 MICRO (GLOVE) ×1 IMPLANT
GOWN STRL REUS W/ TWL LRG LVL3 (GOWN DISPOSABLE) ×2 IMPLANT
GOWN STRL REUS W/TWL LRG LVL3 (GOWN DISPOSABLE) ×2
LABEL CATARACT MEDS ST (LABEL) ×2 IMPLANT
LENS IOL TECNIS ITEC 15.0 (Intraocular Lens) ×2 IMPLANT
PACK CATARACT (MISCELLANEOUS) ×2 IMPLANT
PACK CATARACT BRASINGTON LX (MISCELLANEOUS) ×2 IMPLANT
PACK EYE AFTER SURG (MISCELLANEOUS) ×2 IMPLANT
SOL BSS BAG (MISCELLANEOUS) ×2
SOLUTION BSS BAG (MISCELLANEOUS) ×1 IMPLANT
SYR 5ML LL (SYRINGE) ×2 IMPLANT
WATER STERILE IRR 250ML POUR (IV SOLUTION) ×2 IMPLANT
WIPE NON LINTING 3.25X3.25 (MISCELLANEOUS) ×2 IMPLANT

## 2016-09-26 NOTE — H&P (Signed)
All labs reviewed. Abnormal studies sent to patients PCP when indicated.  Previous H&P reviewed, patient examined, there are NO CHANGES.  Ray Lamb LOUIS8/14/20189:30 AM

## 2016-09-26 NOTE — Anesthesia Procedure Notes (Signed)
Procedure Name: MAC Date/Time: 09/26/2016 9:35 AM Performed by: Darlyne Russian Pre-anesthesia Checklist: Patient identified, Emergency Drugs available, Suction available, Patient being monitored and Timeout performed Patient Re-evaluated:Patient Re-evaluated prior to induction Oxygen Delivery Method: Nasal cannula Placement Confirmation: positive ETCO2

## 2016-09-26 NOTE — Discharge Instructions (Signed)
Eye Surgery Discharge Instructions  Expect mild scratchy sensation or mild soreness. DO NOT RUB YOUR EYE!  The day of surgery:  Minimal physical activity, but bed rest is not required  No reading, computer work, or close hand work  No bending, lifting, or straining.  May watch TV  For 24 hours:  No driving, legal decisions, or alcoholic beverages  Safety precautions  Eat anything you prefer: It is better to start with liquids, then soup then solid foods.  _____ Eye patch should be worn until postoperative exam tomorrow.  ____ Solar shield eyeglasses should be worn for comfort in the sunlight/patch while sleeping  Resume all regular medications including aspirin or Coumadin if these were discontinued prior to surgery. You may shower, bathe, shave, or wash your hair. Tylenol may be taken for mild discomfort.  Call your doctor if you experience significant pain, nausea, or vomiting, fever > 101 or other signs of infection. [phone] or [phone] Specific instructions:  Follow-up Information    Birder Robson, MD Follow up.   Specialty:  Ophthalmology Why:  August  15 at 8:40am Contact information: [address] [place] [address] [phone]

## 2016-09-26 NOTE — Anesthesia Post-op Follow-up Note (Signed)
Anesthesia QCDR form completed.        

## 2016-09-26 NOTE — Anesthesia Postprocedure Evaluation (Signed)
Anesthesia Post Note  Patient: Ray Lamb  Procedure(s) Performed: Procedure(s) (LRB): CATARACT EXTRACTION PHACO AND INTRAOCULAR LENS PLACEMENT (IOC) (Right)  Patient location during evaluation: PACU Anesthesia Type: MAC Level of consciousness: awake and alert Pain management: pain level controlled Vital Signs Assessment: post-procedure vital signs reviewed and stable Respiratory status: spontaneous breathing, nonlabored ventilation, respiratory function stable and patient connected to nasal cannula oxygen Cardiovascular status: stable and blood pressure returned to baseline Anesthetic complications: no     Last Vitals:  Vitals:   09/26/16 0758 09/26/16 0956  BP: (!) 165/50 (!) 153/60  Pulse: (!) 53 (!) 50  Resp: 16 16  Temp: 36.6 C   SpO2: 100% 100%    Last Pain:  Vitals:   09/26/16 0956  TempSrc: Temporal  PainSc: 0-No pain                 Darlyne Russian

## 2016-09-26 NOTE — Transfer of Care (Signed)
Immediate Anesthesia Transfer of Care Note  Patient: Ray Lamb  Procedure(s) Performed: Procedure(s) with comments: CATARACT EXTRACTION PHACO AND INTRAOCULAR LENS PLACEMENT (IOC) (Right) - [place] 01:00 AP% 16.0 CDE 9.71 Fluid pack lot # [id number]  Patient Location: PACU  Anesthesia Type:MAC  Level of Consciousness: awake, alert  and oriented  Airway & Oxygen Therapy: Patient Spontanous Breathing  Post-op Assessment: Report given to RN and Post -op Vital signs reviewed and stable  Post vital signs: Reviewed and stable  Last Vitals:  Vitals:   09/26/16 0758 09/26/16 0956  BP: (!) 165/50 (!) 153/60  Pulse: (!) 53 (!) 50  Resp: 16 16  Temp: 36.6 C   SpO2: 100% 100%    Last Pain:  Vitals:   09/26/16 0956  TempSrc: Temporal  PainSc: 0-No pain         Complications: No apparent anesthesia complications

## 2016-09-26 NOTE — Anesthesia Preprocedure Evaluation (Signed)
Anesthesia Evaluation  Patient identified by MRN, date of birth, ID band Patient awake    Reviewed: Allergy & Precautions, NPO status , Patient's Chart, lab work & pertinent test results  History of Anesthesia Complications Negative for: history of anesthetic complications  Airway Mallampati: II       Dental   Pulmonary neg pulmonary ROS, former smoker,           Cardiovascular hypertension, Pt. on medications and Pt. on home beta blockers + CAD, + Past MI and + Peripheral Vascular Disease       Neuro/Psych TIA   GI/Hepatic hiatal hernia, GERD  Medicated,  Endo/Other  diabetes, Type 2, Oral Hypoglycemic Agents  Renal/GU Renal InsufficiencyRenal disease     Musculoskeletal   Abdominal   Peds  Hematology   Anesthesia Other Findings   Reproductive/Obstetrics                            Anesthesia Physical Anesthesia Plan  ASA: III  Anesthesia Plan: MAC   Post-op Pain Management:    Induction:   PONV Risk Score and Plan:   Airway Management Planned: Nasal Cannula  Additional Equipment:   Intra-op Plan:   Post-operative Plan:   Informed Consent: I have reviewed the patients History and Physical, chart, labs and discussed the procedure including the risks, benefits and alternatives for the proposed anesthesia with the patient or authorized representative who has indicated his/her understanding and acceptance.     Plan Discussed with:   Anesthesia Plan Comments:         Anesthesia Quick Evaluation

## 2016-09-26 NOTE — Op Note (Signed)
PREOPERATIVE DIAGNOSIS:  Nuclear sclerotic cataract of the right eye.   POSTOPERATIVE DIAGNOSIS:  nuclear sclerotic cataract right eye   OPERATIVE PROCEDURE: Procedure(s): CATARACT EXTRACTION PHACO AND INTRAOCULAR LENS PLACEMENT (IOC)   SURGEON:  Birder Robson, MD.   ANESTHESIA:  Anesthesiologist: Gunnar Fusi, MD CRNA: Darlyne Russian, CRNA  1.      Managed anesthesia care. 2.      0.1ml of Shugarcaine was instilled in the eye following the paracentesis.   COMPLICATIONS:  None.   TECHNIQUE:   Stop and chop   DESCRIPTION OF PROCEDURE:  The patient was examined and consented in the preoperative holding area where the aforementioned topical anesthesia was applied to the right eye and then brought back to the Operating Room where the right eye was prepped and draped in the usual sterile ophthalmic fashion and a lid speculum was placed. A paracentesis was created with the side port blade and the anterior chamber was filled with viscoelastic. A near clear corneal incision was performed with the steel keratome. A continuous curvilinear capsulorrhexis was performed with a cystotome followed by the capsulorrhexis forceps. Hydrodissection and hydrodelineation were carried out with BSS on a blunt cannula. The lens was removed in a stop and chop  technique and the remaining cortical material was removed with the irrigation-aspiration handpiece. The capsular bag was inflated with viscoelastic and the Technis [id number]  lens was placed in the capsular bag without complication. The remaining viscoelastic was removed from the eye with the irrigation-aspiration handpiece. The wounds were hydrated. The anterior chamber was flushed with Miostat and the eye was inflated to physiologic pressure. 0.25ml of Vigamox was placed in the anterior chamber. The wounds were found to be water tight. The eye was dressed with Vigamox. The patient was given protective glasses to wear throughout the day and a shield with which  to sleep tonight. The patient was also given drops with which to begin a drop regimen today and will follow-up with me in one day.  Implant Name Type Inv. Item Serial No. Manufacturer Lot No. LRB No. Used  LENS IOL DIOP 15.0 - [id number] Intraocular Lens LENS IOL DIOP 15.0 [id number] AMO   Right 1   Procedure(s) with comments: CATARACT EXTRACTION PHACO AND INTRAOCULAR LENS PLACEMENT (IOC) (Right) - [place] 01:00 AP% 16.0 CDE 9.71 Fluid pack lot # [id number]  Electronically signed: [place] 09/26/2016 9:57 AM

## 2016-09-27 DIAGNOSIS — F0150 Vascular dementia without behavioral disturbance: Secondary | ICD-10-CM | POA: Diagnosis not present

## 2016-09-27 DIAGNOSIS — G309 Alzheimer's disease, unspecified: Secondary | ICD-10-CM | POA: Diagnosis not present

## 2016-09-27 DIAGNOSIS — F0280 Dementia in other diseases classified elsewhere without behavioral disturbance: Secondary | ICD-10-CM | POA: Diagnosis not present

## 2016-09-29 DIAGNOSIS — R079 Chest pain, unspecified: Secondary | ICD-10-CM | POA: Diagnosis not present

## 2016-10-02 DIAGNOSIS — I340 Nonrheumatic mitral (valve) insufficiency: Secondary | ICD-10-CM | POA: Diagnosis not present

## 2016-10-02 DIAGNOSIS — R0602 Shortness of breath: Secondary | ICD-10-CM | POA: Diagnosis not present

## 2016-10-02 DIAGNOSIS — I25810 Atherosclerosis of coronary artery bypass graft(s) without angina pectoris: Secondary | ICD-10-CM | POA: Diagnosis not present

## 2016-10-02 DIAGNOSIS — I10 Essential (primary) hypertension: Secondary | ICD-10-CM | POA: Diagnosis not present

## 2016-10-02 DIAGNOSIS — I2510 Atherosclerotic heart disease of native coronary artery without angina pectoris: Secondary | ICD-10-CM | POA: Diagnosis not present

## 2016-10-02 DIAGNOSIS — E785 Hyperlipidemia, unspecified: Secondary | ICD-10-CM | POA: Diagnosis not present

## 2016-10-02 DIAGNOSIS — I509 Heart failure, unspecified: Secondary | ICD-10-CM | POA: Diagnosis not present

## 2016-11-06 DIAGNOSIS — D044 Carcinoma in situ of skin of scalp and neck: Secondary | ICD-10-CM | POA: Diagnosis not present

## 2016-12-11 ENCOUNTER — Ambulatory Visit (INDEPENDENT_AMBULATORY_CARE_PROVIDER_SITE_OTHER): Payer: Medicare Other

## 2016-12-11 ENCOUNTER — Encounter: Payer: Self-pay | Admitting: Family Medicine

## 2016-12-11 ENCOUNTER — Ambulatory Visit: Payer: Medicare Other | Admitting: Family Medicine

## 2016-12-11 VITALS — BP 170/70 | HR 65 | Temp 97.6°F | Resp 16 | Wt 171.8 lb

## 2016-12-11 VITALS — BP 170/70 | HR 65 | Temp 97.6°F | Resp 16 | Ht 66.0 in | Wt 171.5 lb

## 2016-12-11 DIAGNOSIS — F0280 Dementia in other diseases classified elsewhere without behavioral disturbance: Secondary | ICD-10-CM

## 2016-12-11 DIAGNOSIS — Z0000 Encounter for general adult medical examination without abnormal findings: Secondary | ICD-10-CM

## 2016-12-11 DIAGNOSIS — D692 Other nonthrombocytopenic purpura: Secondary | ICD-10-CM

## 2016-12-11 DIAGNOSIS — I1310 Hypertensive heart and chronic kidney disease without heart failure, with stage 1 through stage 4 chronic kidney disease, or unspecified chronic kidney disease: Secondary | ICD-10-CM

## 2016-12-11 DIAGNOSIS — N184 Chronic kidney disease, stage 4 (severe): Secondary | ICD-10-CM

## 2016-12-11 DIAGNOSIS — Z23 Encounter for immunization: Secondary | ICD-10-CM

## 2016-12-11 DIAGNOSIS — G309 Alzheimer's disease, unspecified: Secondary | ICD-10-CM

## 2016-12-11 DIAGNOSIS — E785 Hyperlipidemia, unspecified: Secondary | ICD-10-CM | POA: Diagnosis not present

## 2016-12-11 DIAGNOSIS — E1122 Type 2 diabetes mellitus with diabetic chronic kidney disease: Secondary | ICD-10-CM

## 2016-12-11 DIAGNOSIS — G300 Alzheimer's disease with early onset: Secondary | ICD-10-CM | POA: Diagnosis not present

## 2016-12-11 LAB — LP+ALT+AST PICCOLO, WAIVED
ALT (SGPT) PICCOLO, WAIVED: 20 U/L (ref 10–47)
AST (SGOT) PICCOLO, WAIVED: 35 U/L (ref 11–38)
CHOL/HDL RATIO PICCOLO,WAIVE: 2.4 mg/dL
Cholesterol Piccolo, Waived: 111 mg/dL (ref <200)
HDL CHOL PICCOLO, WAIVED: 46 mg/dL — AB (ref >59)
LDL Chol Calc Piccolo Waived: 47 mg/dL (ref <100)
TRIGLYCERIDES PICCOLO,WAIVED: 92 mg/dL (ref <150)
VLDL Chol Calc Piccolo,Waive: 18 mg/dL (ref <30)

## 2016-12-11 LAB — BAYER DCA HB A1C WAIVED: HB A1C (BAYER DCA - WAIVED): 7.5 % — ABNORMAL HIGH (ref <7.0)

## 2016-12-11 NOTE — Progress Notes (Signed)
Subjective:   Ray Lamb is a 83 y.o. male who presents for Medicare Annual/Subsequent preventive examination.  Review of Systems:  Cardiac Risk Factors include: male gender;hypertension;diabetes mellitus;advanced age (>70men, >72 women);dyslipidemia     Objective:    Vitals: BP (!) 170/70 (BP Location: Left Arm, Cuff Size: Normal)   Pulse 65   Temp 97.6 F (36.4 C) (Oral)   Resp 16   Ht 5\' 6"  (1.676 m)   Wt 171 lb 8 oz (77.8 kg)   BMI 27.68 kg/m   Body mass index is 27.68 kg/m.  Tobacco History  Smoking Status  . Former Smoker  . Quit date: 09/02/1964  Smokeless Tobacco  . Never Used     Counseling given: Not Answered   Past Medical History:  Diagnosis Date  . Anemia   . Chronic kidney disease    STAGE 4  . Coronary atherosclerosis   . Diabetes mellitus without complication ([place])   . Edema    ankle  . GERD (gastroesophageal reflux disease)   . Glaucoma   . Hearing loss   . Heart murmur   . History of hiatal hernia   . Hyperlipidemia   . Hypertension   . Mini stroke ([place])   . Mitral insufficiency   . Myocardial infarction ([place])    mild, heart cath done no stents needed  . Stroke [facility]) 2011   TIA   Past Surgical History:  Procedure Laterality Date  . CATARACT EXTRACTION W/PHACO Left 09/05/2016   Procedure: CATARACT EXTRACTION PHACO AND INTRAOCULAR LENS PLACEMENT (IOC);  Surgeon: Birder Robson, MD;  Location: [facility] ORS;  Service: Ophthalmology;  Laterality: Left;  [place] 00:43.1 AP% 22.6 CDE 9.77 Fluid Pack lot #[id number]  . CATARACT EXTRACTION W/PHACO Right 09/26/2016   Procedure: CATARACT EXTRACTION PHACO AND INTRAOCULAR LENS PLACEMENT ([place]);  Surgeon: Birder Robson, MD;  Location: [facility] ORS;  Service: Ophthalmology;  Laterality: Right;  [place] 01:00 AP% 16.0 CDE 9.71 Fluid pack lot # [id number]  . CORONARY ARTERY BYPASS GRAFT  2003  . ESOPHAGOGASTRODUODENOSCOPY (EGD) WITH PROPOFOL N/A 04/25/2016   Procedure: ESOPHAGOGASTRODUODENOSCOPY (EGD) WITH  PROPOFOL;  Surgeon: Lucilla Lame, MD;  Location: [facility] ENDOSCOPY;  Service: Endoscopy;  Laterality: N/A;  . JOINT REPLACEMENT Right 2010   knee  . SKIN CANCER EXCISION     Family History  Problem Relation Age of Onset  . Stroke Mother   . Diabetes Mother   . Diabetes Sister   . Diabetes Maternal Grandmother    History  Sexual Activity  . Sexual activity: Not on file    Outpatient Encounter Prescriptions as of 12/11/2016  Medication Sig  . aspirin EC 81 MG tablet Take 81 mg by mouth daily.  Marland Kitchen atorvastatin (LIPITOR) 80 MG tablet Take 1 tablet (80 mg total) by mouth at bedtime.  . clopidogrel (PLAVIX) 75 MG tablet Take 1 tablet (75 mg total) by mouth daily.  Marland Kitchen ezetimibe (ZETIA) 10 MG tablet Take 1 tablet (10 mg total) by mouth daily.  . furosemide (LASIX) 40 MG tablet   . glipiZIDE (GLUCOTROL) 5 MG tablet Take 1 tablet (5 mg total) by mouth daily.  . isosorbide mononitrate (IMDUR) 30 MG 24 hr tablet   . JANUVIA 25 MG tablet Take 1 tablet (25 mg total) by mouth daily.  . metoprolol succinate (TOPROL-XL) 25 MG 24 hr tablet Take 1 tablet (25 mg total) by mouth daily.  . mometasone (ELOCON) 0.1 % lotion APPLY 4 DROPS INTO EACH EAR DAILY FOR 10 DAYS THEN AS  NEEDED FOR DRY SKIN AND ITCHING  . Multiple Vitamin (MULTIVITAMIN) tablet Take 1 tablet by mouth daily.  . timolol (TIMOPTIC) 0.5 % ophthalmic solution Place 1 drop into both eyes 2 (two) times daily.  . TURMERIC PO Take 1,300 mg by mouth.  . Coenzyme Q10 (CO Q 10) 10 MG CAPS Take by mouth daily.  Marland Kitchen donepezil (ARICEPT) 5 MG tablet Take by mouth.  . olmesartan (BENICAR) 20 MG tablet Take by mouth.  . Omega-3 Fatty Acids (FISH OIL) 1000 MG CAPS Take 1,000 mg by mouth daily.  . [DISCONTINUED] olmesartan-hydrochlorothiazide (BENICAR HCT) 20-12.5 MG per tablet Take 1 tablet by mouth daily.   No facility-administered encounter medications on file as of 12/11/2016.     Activities of Daily Living In your present state of health, do you  have any difficulty performing the following activities: 12/11/2016  Hearing? Y  Vision? N  Difficulty concentrating or making decisions? Y  Walking or climbing stairs? Y  Dressing or bathing? N  Doing errands, shopping? N  Preparing Food and eating ? N  Using the Toilet? N  In the past six months, have you accidently leaked urine? N  Do you have problems with loss of bowel control? N  Managing your Medications? N  Managing your Finances? N  Housekeeping or managing your Housekeeping? N  Some recent data might be hidden    Patient Care Team: [name], MD as PCP - General (Family Medicine) [name], MD as PCP - Family Medicine (Family Medicine) Murlean Iba, MD (Internal Medicine) Dionisio David, MD as Consulting Physician (Cardiology) Sharlotte Alamo, DPM (Podiatry) Ronita Hipps Elmer Ramp., MD (Dentistry) Lucilla Lame, MD as Consulting Physician (Gastroenterology) Vladimir Crofts, MD as Consulting Physician (Neurology)   Assessment:     Exercise Activities and Dietary recommendations Current Exercise Habits: The patient does not participate in regular exercise at present;Home exercise routine, Type of exercise: treadmill;strength training/weights, Time (Minutes): 60, Frequency (Times/Week): 5, Weekly Exercise (Minutes/Week): 300, Intensity: Mild, Exercise limited by: None identified  Goals    . Increase water intake          Recommend drinking at least 5-6 glasses of water a day       Fall Risk Fall Risk  12/11/2016 08/23/2016 08/08/2016 05/03/2016 02/01/2016  Falls in the past year? No No No No Yes  Number falls in past yr: - - - - 1  Injury with Fall? - - - - No   Depression Screen PHQ 2/9 Scores 12/11/2016 08/23/2016 08/08/2016 02/01/2016  PHQ - 2 Score 0 1 1 1   PHQ- 9 Score 2 - - -    Cognitive Function MMSE - Mini Mental State Exam 02/01/2016  Orientation to time 5  Orientation to Place 5  Registration 2  Attention/ Calculation 3  Recall 3  Language-  name 2 objects 2  Language- repeat 1  Language- follow 3 step command 3  Language- read & follow direction 1  Write a sentence 1  Copy design 1  Total score 27     6CIT Screen 12/11/2016  What Year? 0 points  What month? 0 points  What time? 0 points  Count back from 20 0 points  Months in reverse 0 points  Repeat phrase 2 points  Total Score 2    Immunization History  Administered Date(s) Administered  . Influenza, High Dose Seasonal PF 11/26/2015, 12/11/2016  . Influenza,inj,Quad PF,6+ Mos 12/03/2014  . Influenza-Unspecified 11/12/2013  . Pneumococcal Conjugate-13 09/03/2013  .  Pneumococcal Polysaccharide-23 10/15/1995, 11/13/2000  . Td 08/08/2004, 10/06/2015  . Zoster 02/26/2006   Screening Tests Health Maintenance  Topic Date Due  . FOOT EXAM  03/22/2016  . OPHTHALMOLOGY EXAM  06/28/2016  . INFLUENZA VACCINE  09/13/2016  . HEMOGLOBIN A1C  02/07/2017  . TETANUS/TDAP  10/05/2025  . PNA vac Low Risk Adult  Completed      Plan:    I have personally reviewed and addressed the Medicare Annual Wellness questionnaire and have noted the following in the patient's chart:  A. Medical and social history B. Use of alcohol, tobacco or illicit drugs  C. Current medications and supplements D. Functional ability and status E.  Nutritional status F.  Physical activity G. Advance directives H. List of other physicians I.  Hospitalizations, surgeries, and ER visits in previous 12 months J.  [place] such as hearing and vision if needed, cognitive and depression L. Referrals and appointments   In addition, I have reviewed and discussed with patient certain preventive protocols, quality metrics, and best practice recommendations. A written personalized care plan for preventive services as well as general preventive health recommendations were provided to patient.   Signed,  Tyler Aas, LPN Nurse Health Advisor   MD Recommendations: due for diabetic foot  exam- sees podiatrist.

## 2016-12-11 NOTE — Patient Instructions (Addendum)
Mr. Kelter , Thank you for taking time to come for your Medicare Wellness Visit. I appreciate your ongoing commitment to your health goals. Please review the following plan we discussed and let me know if I can assist you in the future.   Screening recommendations/referrals: Colonoscopy: no longer required Recommended yearly ophthalmology/optometry visit for glaucoma screening and checkup Recommended yearly dental visit for hygiene and checkup  Vaccinations: Influenza vaccine: done today  Pneumococcal vaccine: up to date Tdap vaccine: up to date Shingles vaccine: up to date  Advanced directives: Advance directive discussed with you today. I have provided a copy for you to complete at home and have notarized. Once this is complete please bring a copy in to our office so we can scan it into your chart.  Conditions/risks identified:  Recommend drinking at least 5-6 glasses of water a day  Next appointment: Follow up in one year for your annual wellness exam.   Preventive Care 65 Years and Older, Male Preventive care refers to lifestyle choices and visits with your health care provider that can promote health and wellness. What does preventive care include?  A yearly physical exam. This is also called an annual well check.  Dental exams once or twice a year.  Routine eye exams. Ask your health care provider how often you should have your eyes checked.  Personal lifestyle choices, including:  Daily care of your teeth and gums.  Regular physical activity.  Eating a healthy diet.  Avoiding tobacco and drug use.  Limiting alcohol use.  Practicing safe sex.  Taking low doses of aspirin every day.  Taking vitamin and mineral supplements as recommended by your health care provider. What happens during an annual well check? The services and screenings done by your health care provider during your annual well check will depend on your age, overall health, lifestyle risk factors, and  family history of disease. Counseling  Your health care provider may ask you questions about your:  Alcohol use.  Tobacco use.  Drug use.  Emotional well-being.  Home and relationship well-being.  Sexual activity.  Eating habits.  History of falls.  Memory and ability to understand (cognition).  Work and work Statistician. Screening  You may have the following tests or measurements:  Height, weight, and BMI.  Blood pressure.  Lipid and cholesterol levels. These may be checked every 5 years, or more frequently if you are over 35 years old.  Skin check.  Lung cancer screening. You may have this screening every year starting at age 50 if you have a 30-pack-year history of smoking and currently smoke or have quit within the past 15 years.  Fecal occult blood test (FOBT) of the stool. You may have this test every year starting at age 74.  Flexible sigmoidoscopy or colonoscopy. You may have a sigmoidoscopy every 5 years or a colonoscopy every 10 years starting at age 44.  Prostate cancer screening. Recommendations will vary depending on your family history and other risks.  Hepatitis C blood test.  Hepatitis B blood test.  Sexually transmitted disease (STD) testing.  Diabetes screening. This is done by checking your blood sugar (glucose) after you have not eaten for a while (fasting). You may have this done every 1-3 years.  Abdominal aortic aneurysm (AAA) screening. You may need this if you are a current or former smoker.  Osteoporosis. You may be screened starting at age 2 if you are at high risk. Talk with your health care provider about your test  results, treatment options, and if necessary, the need for more tests. Vaccines  Your health care provider may recommend certain vaccines, such as:  Influenza vaccine. This is recommended every year.  Tetanus, diphtheria, and acellular pertussis (Tdap, Td) vaccine. You may need a Td booster every 10 years.  Zoster  vaccine. You may need this after age 28.  Pneumococcal 13-valent conjugate (PCV13) vaccine. One dose is recommended after age 89.  Pneumococcal polysaccharide (PPSV23) vaccine. One dose is recommended after age 90. Talk to your health care provider about which screenings and vaccines you need and how often you need them. This information is not intended to replace advice given to you by your health care provider. Make sure you discuss any questions you have with your health care provider. Document Released: 02/26/2015 Document Revised: 10/20/2015 Document Reviewed: 12/01/2014 Elsevier Interactive Patient Education  2017 [place] Prevention in the Home Falls can cause injuries. They can happen to people of all ages. There are many things you can do to make your home safe and to help prevent falls. What can I do on the outside of my home?  Regularly fix the edges of walkways and driveways and fix any cracks.  Remove anything that might make you trip as you walk through a door, such as a raised step or threshold.  Trim any bushes or trees on the path to your home.  Use bright outdoor lighting.  Clear any walking paths of anything that might make someone trip, such as rocks or tools.  Regularly check to see if handrails are loose or broken. Make sure that both sides of any steps have handrails.  Any raised decks and porches should have guardrails on the edges.  Have any leaves, snow, or ice cleared regularly.  Use sand or salt on walking paths during winter.  Clean up any spills in your garage right away. This includes oil or grease spills. What can I do in the bathroom?  Use night lights.  Install grab bars by the toilet and in the tub and shower. Do not use towel bars as grab bars.  Use non-skid mats or decals in the tub or shower.  If you need to sit down in the shower, use a plastic, non-slip stool.  Keep the floor dry. Clean up any water that spills on the  floor as soon as it happens.  Remove soap buildup in the tub or shower regularly.  Attach bath mats securely with double-sided non-slip rug tape.  Do not have throw rugs and other things on the floor that can make you trip. What can I do in the bedroom?  Use night lights.  Make sure that you have a light by your bed that is easy to reach.  Do not use any sheets or blankets that are too big for your bed. They should not hang down onto the floor.  Have a firm chair that has side arms. You can use this for support while you get dressed.  Do not have throw rugs and other things on the floor that can make you trip. What can I do in the kitchen?  Clean up any spills right away.  Avoid walking on wet floors.  Keep items that you use a lot in easy-to-reach places.  If you need to reach something above you, use a strong step stool that has a grab bar.  Keep electrical cords out of the way.  Do not use floor polish or wax that makes  floors slippery. If you must use wax, use non-skid floor wax.  Do not have throw rugs and other things on the floor that can make you trip. What can I do with my stairs?  Do not leave any items on the stairs.  Make sure that there are handrails on both sides of the stairs and use them. Fix handrails that are broken or loose. Make sure that handrails are as long as the stairways.  Check any carpeting to make sure that it is firmly attached to the stairs. Fix any carpet that is loose or worn.  Avoid having throw rugs at the top or bottom of the stairs. If you do have throw rugs, attach them to the floor with carpet tape.  Make sure that you have a light switch at the top of the stairs and the bottom of the stairs. If you do not have them, ask someone to add them for you. What else can I do to help prevent falls?  Wear shoes that:  Do not have high heels.  Have rubber bottoms.  Are comfortable and fit you well.  Are closed at the toe. Do not wear  sandals.  If you use a stepladder:  Make sure that it is fully opened. Do not climb a closed stepladder.  Make sure that both sides of the stepladder are locked into place.  Ask someone to hold it for you, if possible.  Clearly mark and make sure that you can see:  Any grab bars or handrails.  First and last steps.  Where the edge of each step is.  Use tools that help you move around (mobility aids) if they are needed. These include:  Canes.  Walkers.  Scooters.  Crutches.  Turn on the lights when you go into a dark area. Replace any light bulbs as soon as they burn out.  Set up your furniture so you have a clear path. Avoid moving your furniture around.  If any of your floors are uneven, fix them.  If there are any pets around you, be aware of where they are.  Review your medicines with your doctor. Some medicines can make you feel dizzy. This can increase your chance of falling. Ask your doctor what other things that you can do to help prevent falls. This information is not intended to replace advice given to you by your health care provider. Make sure you discuss any questions you have with your health care provider. Document Released: 11/26/2008 Document Revised: 07/08/2015 Document Reviewed: 03/06/2014 Elsevier Interactive Patient Education  2017 [place].  Influenza (Flu) Vaccine (Inactivated or Recombinant): What You Need to Know 1. Why get vaccinated? Influenza ("flu") is a contagious disease that spreads around the [place] every year, usually between October and May. Flu is caused by influenza viruses, and is spread mainly by coughing, sneezing, and close contact. Anyone can get flu. Flu strikes suddenly and can last several days. Symptoms vary by age, but can include:  fever/chills  sore throat  muscle aches  fatigue  cough  headache  runny or stuffy nose  Flu can also lead to pneumonia and blood infections, and cause diarrhea and  seizures in children. If you have a medical condition, such as heart or lung disease, flu can make it worse. Flu is more dangerous for some people. Infants and young children, people 77 years of age and older, pregnant women, and people with certain health conditions or a weakened immune system are at greatest risk.  Each year thousands of people in the [place] die from flu, and many more are hospitalized. Flu vaccine can:  keep you from getting flu,  make flu less severe if you do get it, and  keep you from spreading flu to your family and other people. 2. Inactivated and recombinant flu vaccines A dose of flu vaccine is recommended every flu season. Children 6 months through 4 years of age may need two doses during the same flu season. Everyone else needs only one dose each flu season. Some inactivated flu vaccines contain a very small amount of a mercury-based preservative called thimerosal. Studies have not shown thimerosal in vaccines to be harmful, but flu vaccines that do not contain thimerosal are available. There is no live flu virus in flu shots. They cannot cause the flu. There are many flu viruses, and they are always changing. Each year a new flu vaccine is made to protect against three or four viruses that are likely to cause disease in the upcoming flu season. But even when the vaccine doesn't exactly match these viruses, it may still provide some protection. Flu vaccine cannot prevent:  flu that is caused by a virus not covered by the vaccine, or  illnesses that look like flu but are not.  It takes about 2 weeks for protection to develop after vaccination, and protection lasts through the flu season. 3. Some people should not get this vaccine Tell the person who is giving you the vaccine:  If you have any severe, life-threatening allergies. If you ever had a life-threatening allergic reaction after a dose of flu vaccine, or have a severe allergy to any part of this  vaccine, you may be advised not to get vaccinated. Most, but not all, types of flu vaccine contain a small amount of egg protein.  If you ever had Guillain-Barr Syndrome (also called GBS). Some people with a history of GBS should not get this vaccine. This should be discussed with your doctor.  If you are not feeling well. It is usually okay to get flu vaccine when you have a mild illness, but you might be asked to come back when you feel better.  4. Risks of a vaccine reaction With any medicine, including vaccines, there is a chance of reactions. These are usually mild and go away on their own, but serious reactions are also possible. Most people who get a flu shot do not have any problems with it. Minor problems following a flu shot include:  soreness, redness, or swelling where the shot was given  hoarseness  sore, red or itchy eyes  cough  fever  aches  headache  itching  fatigue  If these problems occur, they usually begin soon after the shot and last 1 or 2 days. More serious problems following a flu shot can include the following:  There may be a small increased risk of Guillain-Barre Syndrome (GBS) after inactivated flu vaccine. This risk has been estimated at 1 or 2 additional cases per million people vaccinated. This is much lower than the risk of severe complications from flu, which can be prevented by flu vaccine.  Young children who get the flu shot along with pneumococcal vaccine (PCV13) and/or DTaP vaccine at the same time might be slightly more likely to have a seizure caused by fever. Ask your doctor for more information. Tell your doctor if a child who is getting flu vaccine has ever had a seizure.  Problems that could happen after any injected  vaccine:  People sometimes faint after a medical procedure, including vaccination. Sitting or lying down for about 15 minutes can help prevent fainting, and injuries caused by a fall. Tell your doctor if you feel dizzy,  or have vision changes or ringing in the ears.  Some people get severe pain in the shoulder and have difficulty moving the arm where a shot was given. This happens very rarely.  Any medication can cause a severe allergic reaction. Such reactions from a vaccine are very rare, estimated at about 1 in a million doses, and would happen within a few minutes to a few hours after the vaccination. As with any medicine, there is a very remote chance of a vaccine causing a serious injury or death. The safety of vaccines is always being monitored. For more information, visit: http://www.aguilar.org/ 5. What if there is a serious reaction? What should I look for? Look for anything that concerns you, such as signs of a severe allergic reaction, very high fever, or unusual behavior. Signs of a severe allergic reaction can include hives, swelling of the face and throat, difficulty breathing, a fast heartbeat, dizziness, and weakness. These would start a few minutes to a few hours after the vaccination. What should I do?  If you think it is a severe allergic reaction or other emergency that can't wait, call 9-1-1 and get the person to the nearest hospital. Otherwise, call your doctor.  Reactions should be reported to the Vaccine Adverse Event Reporting System (VAERS). Your doctor should file this report, or you can do it yourself through the VAERS web site at www.vaers.SamedayNews.es, or by calling 906 747 6953. ? VAERS does not give medical advice. 6. The National Vaccine Injury Compensation Program The [employer] Vaccine Injury Compensation Program (VICP) is a federal program that was created to compensate people who may have been injured by certain vaccines. Persons who believe they may have been injured by a vaccine can learn about the program and about filing a claim by calling 514-591-4438 or visiting the [employer] website at GoldCloset.com.ee. There is a time limit to file a claim for  compensation. 7. How can I learn more?  Ask your healthcare provider. He or she can give you the vaccine package insert or suggest other sources of information.  Call your local or state health department.  Contact the Centers for Disease Control and Prevention (CDC): ? Call 319-358-8077 (1-800-CDC-INFO) or ? Visit CDC's website at https://gibson.com/ Vaccine Information Statement, Inactivated Influenza Vaccine (09/19/2013) This information is not intended to replace advice given to you by your health care provider. Make sure you discuss any questions you have with your health care provider. Document Released: 11/24/2005 Document Revised: 10/21/2015 Document Reviewed: 10/21/2015 Elsevier Interactive Patient Education  2017 [employer].

## 2016-12-11 NOTE — Assessment & Plan Note (Signed)
stable °

## 2016-12-11 NOTE — Progress Notes (Signed)
BP (!) 170/70 (BP Location: Left Arm, Cuff Size: Normal)   Pulse 65   Temp 97.6 F (36.4 C)   Resp 16   Wt 171 lb 12.8 oz (77.9 kg)   BMI 27.73 kg/m    Subjective:    Patient ID: Ray Lamb, male    DOB: [birth year], 83 y.o.   MRN: [id number]  HPI: Ray Lamb is a 83 y.o. male  Chief Complaint  Patient presents with  . Diabetes  . Hyperlipidemia  . Hypertension   Patient stable all in all doing well provides history and seemingly does okay in spite of diagnosis of early onset Alzheimer's Patient has been stable with no medical complaints. Some confusion with blood pressure medications and switching pharmacies and refills patient is been out of blood pressure medicine for at least a month Patient just got his medications Saturday and is taking one time now.  Relevant past medical, surgical, family and social history reviewed and updated as indicated. Interim medical history since our last visit reviewed. Allergies and medications reviewed and updated.  Review of Systems  Constitutional: Negative.   Respiratory: Negative.   Cardiovascular: Negative.     Per HPI unless specifically indicated above     Objective:    BP (!) 170/70 (BP Location: Left Arm, Cuff Size: Normal)   Pulse 65   Temp 97.6 F (36.4 C)   Resp 16   Wt 171 lb 12.8 oz (77.9 kg)   BMI 27.73 kg/m   Wt Readings from Last 3 Encounters:  12/11/16 171 lb 12.8 oz (77.9 kg)  12/11/16 171 lb 8 oz (77.8 kg)  09/26/16 171 lb (77.6 kg)    Physical Exam  Constitutional: He is oriented to person, place, and time. He appears well-developed and well-nourished.  HENT:  Head: Normocephalic and atraumatic.  Eyes: Conjunctivae and EOM are normal.  Neck: Normal range of motion.  Cardiovascular: Normal rate, regular rhythm and normal heart sounds.   Pulmonary/Chest: Effort normal and breath sounds normal.  Musculoskeletal: Normal range of motion.  Neurological: He is alert and oriented to person, place, and  time.  Skin: No erythema.  Psychiatric: He has a normal mood and affect. His behavior is normal. Judgment and thought content normal.    Results for orders placed or performed during the hospital encounter of 09/26/16  Glucose, capillary  Result Value Ref Range   Glucose-Capillary 154 (H) 65 - 99 mg/dL      Assessment & Plan:   Problem List Items Addressed This Visit      Cardiovascular and Mediastinum   Hypertensive heart and renal disease    Discussed taking medications faithfully and if problems with the pharmacy to notify this office so that a temporary supply can be available.       Relevant Orders   Bayer DCA Hb A1c Waived   Basic metabolic panel   LP+ALT+AST Piccolo, Waived   Senile purpura ([place])    stable        Endocrine   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 disease ([place]) - Primary    The current medical regimen is effective;  continue present plan and medications.       Relevant Orders   Bayer DCA Hb A1c Waived   Basic metabolic panel   LP+ALT+AST Piccolo, Waived     Nervous and Auditory   Alzheimer's dementia    Reviewed the neurology notes and patient stable        Other  Hyperlipidemia    The current medical regimen is effective;  continue present plan and medications.       Relevant Orders   Bayer DCA Hb A1c Waived   Basic metabolic panel   LP+ALT+AST Piccolo, Waived       Follow up plan: Return in about 3 months (around 03/13/2017) for Physical Exam, Hemoglobin A1c.

## 2016-12-11 NOTE — Assessment & Plan Note (Signed)
The current medical regimen is effective;  continue present plan and medications.  

## 2016-12-11 NOTE — Assessment & Plan Note (Signed)
Reviewed the neurology notes and patient stable

## 2016-12-11 NOTE — Assessment & Plan Note (Addendum)
Discussed taking medications faithfully and if problems with the pharmacy to notify this office so that a temporary supply can be available.

## 2016-12-12 ENCOUNTER — Encounter: Payer: Self-pay | Admitting: Family Medicine

## 2016-12-12 LAB — BASIC METABOLIC PANEL
BUN/Creatinine Ratio: 16 (ref 10–24)
BUN: 35 mg/dL — AB (ref 8–27)
CALCIUM: 9.8 mg/dL (ref 8.6–10.2)
CHLORIDE: 101 mmol/L (ref 96–106)
CO2: 24 mmol/L (ref 20–29)
Creatinine, Ser: 2.16 mg/dL — ABNORMAL HIGH (ref 0.76–1.27)
GFR, EST AFRICAN AMERICAN: 31 mL/min/{1.73_m2} — AB (ref >59)
GFR, EST NON AFRICAN AMERICAN: 27 mL/min/{1.73_m2} — AB (ref >59)
Glucose: 175 mg/dL — ABNORMAL HIGH (ref 65–99)
POTASSIUM: 4.7 mmol/L (ref 3.5–5.2)
Sodium: 138 mmol/L (ref 134–144)

## 2016-12-28 DIAGNOSIS — B351 Tinea unguium: Secondary | ICD-10-CM | POA: Diagnosis not present

## 2016-12-28 DIAGNOSIS — M79675 Pain in left toe(s): Secondary | ICD-10-CM | POA: Diagnosis not present

## 2016-12-28 DIAGNOSIS — M79674 Pain in right toe(s): Secondary | ICD-10-CM | POA: Diagnosis not present

## 2016-12-29 DIAGNOSIS — E875 Hyperkalemia: Secondary | ICD-10-CM | POA: Diagnosis not present

## 2016-12-29 DIAGNOSIS — R600 Localized edema: Secondary | ICD-10-CM | POA: Diagnosis not present

## 2016-12-29 DIAGNOSIS — I10 Essential (primary) hypertension: Secondary | ICD-10-CM | POA: Diagnosis not present

## 2016-12-29 DIAGNOSIS — N184 Chronic kidney disease, stage 4 (severe): Secondary | ICD-10-CM | POA: Diagnosis not present

## 2016-12-29 DIAGNOSIS — E1122 Type 2 diabetes mellitus with diabetic chronic kidney disease: Secondary | ICD-10-CM | POA: Diagnosis not present

## 2017-01-02 DIAGNOSIS — R600 Localized edema: Secondary | ICD-10-CM | POA: Diagnosis not present

## 2017-01-02 DIAGNOSIS — E1129 Type 2 diabetes mellitus with other diabetic kidney complication: Secondary | ICD-10-CM | POA: Diagnosis not present

## 2017-01-02 DIAGNOSIS — N184 Chronic kidney disease, stage 4 (severe): Secondary | ICD-10-CM | POA: Diagnosis not present

## 2017-01-02 DIAGNOSIS — I10 Essential (primary) hypertension: Secondary | ICD-10-CM | POA: Diagnosis not present

## 2017-01-16 DIAGNOSIS — X32XXXA Exposure to sunlight, initial encounter: Secondary | ICD-10-CM | POA: Diagnosis not present

## 2017-01-16 DIAGNOSIS — Z08 Encounter for follow-up examination after completed treatment for malignant neoplasm: Secondary | ICD-10-CM | POA: Diagnosis not present

## 2017-01-16 DIAGNOSIS — R58 Hemorrhage, not elsewhere classified: Secondary | ICD-10-CM | POA: Diagnosis not present

## 2017-01-16 DIAGNOSIS — L570 Actinic keratosis: Secondary | ICD-10-CM | POA: Diagnosis not present

## 2017-01-16 DIAGNOSIS — L820 Inflamed seborrheic keratosis: Secondary | ICD-10-CM | POA: Diagnosis not present

## 2017-01-16 DIAGNOSIS — Z85828 Personal history of other malignant neoplasm of skin: Secondary | ICD-10-CM | POA: Diagnosis not present

## 2017-01-19 DIAGNOSIS — I509 Heart failure, unspecified: Secondary | ICD-10-CM | POA: Diagnosis not present

## 2017-01-19 DIAGNOSIS — E785 Hyperlipidemia, unspecified: Secondary | ICD-10-CM | POA: Diagnosis not present

## 2017-01-19 DIAGNOSIS — I25810 Atherosclerosis of coronary artery bypass graft(s) without angina pectoris: Secondary | ICD-10-CM | POA: Diagnosis not present

## 2017-01-19 DIAGNOSIS — I10 Essential (primary) hypertension: Secondary | ICD-10-CM | POA: Diagnosis not present

## 2017-01-19 DIAGNOSIS — R0602 Shortness of breath: Secondary | ICD-10-CM | POA: Diagnosis not present

## 2017-01-19 DIAGNOSIS — I340 Nonrheumatic mitral (valve) insufficiency: Secondary | ICD-10-CM | POA: Diagnosis not present

## 2017-01-19 DIAGNOSIS — I2510 Atherosclerotic heart disease of native coronary artery without angina pectoris: Secondary | ICD-10-CM | POA: Diagnosis not present

## 2017-01-27 DIAGNOSIS — R040 Epistaxis: Secondary | ICD-10-CM | POA: Diagnosis not present

## 2017-01-27 DIAGNOSIS — N184 Chronic kidney disease, stage 4 (severe): Secondary | ICD-10-CM | POA: Insufficient documentation

## 2017-01-27 DIAGNOSIS — Z7984 Long term (current) use of oral hypoglycemic drugs: Secondary | ICD-10-CM | POA: Diagnosis not present

## 2017-01-27 DIAGNOSIS — Y9301 Activity, walking, marching and hiking: Secondary | ICD-10-CM | POA: Diagnosis not present

## 2017-01-27 DIAGNOSIS — I252 Old myocardial infarction: Secondary | ICD-10-CM | POA: Diagnosis not present

## 2017-01-27 DIAGNOSIS — E1122 Type 2 diabetes mellitus with diabetic chronic kidney disease: Secondary | ICD-10-CM | POA: Diagnosis not present

## 2017-01-27 DIAGNOSIS — Z951 Presence of aortocoronary bypass graft: Secondary | ICD-10-CM | POA: Insufficient documentation

## 2017-01-27 DIAGNOSIS — Z85828 Personal history of other malignant neoplasm of skin: Secondary | ICD-10-CM | POA: Insufficient documentation

## 2017-01-27 DIAGNOSIS — S0993XA Unspecified injury of face, initial encounter: Secondary | ICD-10-CM | POA: Diagnosis not present

## 2017-01-27 DIAGNOSIS — Y929 Unspecified place or not applicable: Secondary | ICD-10-CM | POA: Diagnosis not present

## 2017-01-27 DIAGNOSIS — Z7982 Long term (current) use of aspirin: Secondary | ICD-10-CM | POA: Insufficient documentation

## 2017-01-27 DIAGNOSIS — W0110XA Fall on same level from slipping, tripping and stumbling with subsequent striking against unspecified object, initial encounter: Secondary | ICD-10-CM | POA: Insufficient documentation

## 2017-01-27 DIAGNOSIS — Y999 Unspecified external cause status: Secondary | ICD-10-CM | POA: Insufficient documentation

## 2017-01-27 DIAGNOSIS — Z7902 Long term (current) use of antithrombotics/antiplatelets: Secondary | ICD-10-CM | POA: Diagnosis not present

## 2017-01-27 DIAGNOSIS — Z87891 Personal history of nicotine dependence: Secondary | ICD-10-CM | POA: Insufficient documentation

## 2017-01-27 DIAGNOSIS — I129 Hypertensive chronic kidney disease with stage 1 through stage 4 chronic kidney disease, or unspecified chronic kidney disease: Secondary | ICD-10-CM | POA: Diagnosis not present

## 2017-01-27 DIAGNOSIS — S0990XA Unspecified injury of head, initial encounter: Secondary | ICD-10-CM | POA: Diagnosis not present

## 2017-01-27 NOTE — ED Triage Notes (Signed)
Reports fell forward.  Patient with nose bleed (bleeding from both nares).  Patient reports he just stumbled.  Denies loss of consciousness.  Patient does take blood thinner (plavix).

## 2017-01-28 ENCOUNTER — Other Ambulatory Visit: Payer: Self-pay

## 2017-01-28 ENCOUNTER — Emergency Department
Admission: EM | Admit: 2017-01-28 | Discharge: 2017-01-28 | Disposition: A | Discharge status: Home / Self Care | Payer: Medicare Other | Attending: Emergency Medicine | Admitting: Emergency Medicine

## 2017-01-28 ENCOUNTER — Emergency Department: Payer: Medicare Other

## 2017-01-28 DIAGNOSIS — R040 Epistaxis: Secondary | ICD-10-CM | POA: Diagnosis not present

## 2017-01-28 DIAGNOSIS — W19XXXA Unspecified fall, initial encounter: Secondary | ICD-10-CM

## 2017-01-28 DIAGNOSIS — S0990XA Unspecified injury of head, initial encounter: Secondary | ICD-10-CM | POA: Diagnosis not present

## 2017-01-28 DIAGNOSIS — S0993XA Unspecified injury of face, initial encounter: Secondary | ICD-10-CM | POA: Diagnosis not present

## 2017-01-28 MED ORDER — HYDROCODONE-ACETAMINOPHEN 5-325 MG PO TABS
0.5000 | ORAL_TABLET | Freq: Once | ORAL | Status: AC
  Administered 2017-01-28: 0.5 via ORAL
  Filled 2017-01-28: qty 1

## 2017-01-28 MED ORDER — HYDROCODONE-ACETAMINOPHEN 5-325 MG PO TABS: 0.5000 | ORAL_TABLET | Freq: Four times a day (QID) | ORAL | 0 refills | Status: DC | PRN

## 2017-01-28 MED ORDER — OXYMETAZOLINE HCL 0.05 % NA SOLN
NASAL | Status: AC
  Filled 2017-01-28: qty 15

## 2017-01-28 MED ORDER — AMOXICILLIN-POT CLAVULANATE 875-125 MG PO TABS: 1.0000 | ORAL_TABLET | Freq: Two times a day (BID) | ORAL | 0 refills | Status: AC

## 2017-01-28 MED ORDER — OXYMETAZOLINE HCL 0.05 % NA SOLN
1.0000 | Freq: Once | NASAL | Status: AC
  Administered 2017-01-28: 1 via NASAL

## 2017-01-28 NOTE — ED Provider Notes (Signed)
[facility] Emergency Department Provider Note  ____________________________________________   First MD Initiated Contact with Patient 01/28/17 [phone]     (approximate)  I have reviewed the triage vital signs and the nursing notes.   HISTORY  Chief Complaint Fall   HPI Ray Lamb is a 83 y.o. male history of chronic kidney disease, diabetes, previous coronary disease  The patient got up from a chair, reports he stood up and tripped on the carpet/floor falling forward striking the front of his head.  He began having bleeding from the nose, much on the right side.  Denies headache but reports some pain across the bridge of the nose, he tried for about 30-40 minutes to get the bleeding in his nose to stop, but will not.  He is on Plavix no  anticoagulant.  Denies headache.  No neck pain.  No numbness or weakness.  No proceeding symptoms such as chest pain palpitations or trouble breathing.  Wife is able to help him up, he reports that he came now mostly because he cannot get the bleeding to stop his right nose.   Past Medical History:  Diagnosis Date  . Anemia   . Chronic kidney disease    STAGE 4  . Coronary atherosclerosis   . Diabetes mellitus without complication ([place])   . Edema    ankle  . GERD (gastroesophageal reflux disease)   . Glaucoma   . Hearing loss   . Heart murmur   . History of hiatal hernia   . Hyperlipidemia   . Hypertension   . Mini stroke ([place])   . Mitral insufficiency   . Myocardial infarction ([place])    mild, heart cath done no stents needed  . Stroke [facility]) 2011   TIA    Patient Active Problem List   Diagnosis Date Noted  . Alzheimer's dementia 12/11/2016  . Advance care planning 05/03/2016  . Gastritis without bleeding   . Duodenitis   . Hyperlipidemia 12/03/2014  . Senile purpura ([place]) 12/03/2014  . Hypertensive heart and renal disease 09/03/2014  . 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 disease ([place])  09/03/2014    Past Surgical History:  Procedure Laterality Date  . CATARACT EXTRACTION W/PHACO Left 09/05/2016   Procedure: CATARACT EXTRACTION PHACO AND INTRAOCULAR LENS PLACEMENT (IOC);  Surgeon: Birder Robson, MD;  Location: [facility] ORS;  Service: Ophthalmology;  Laterality: Left;  [place] 00:43.1 AP% 22.6 CDE 9.77 Fluid Pack lot #[id number]  . CATARACT EXTRACTION W/PHACO Right 09/26/2016   Procedure: CATARACT EXTRACTION PHACO AND INTRAOCULAR LENS PLACEMENT ([place]);  Surgeon: Birder Robson, MD;  Location: [facility] ORS;  Service: Ophthalmology;  Laterality: Right;  [place] 01:00 AP% 16.0 CDE 9.71 Fluid pack lot # [id number]  . CORONARY ARTERY BYPASS GRAFT  2003  . ESOPHAGOGASTRODUODENOSCOPY (EGD) WITH PROPOFOL N/A 04/25/2016   Procedure: ESOPHAGOGASTRODUODENOSCOPY (EGD) WITH PROPOFOL;  Surgeon: Lucilla Lame, MD;  Location: [facility] ENDOSCOPY;  Service: Endoscopy;  Laterality: N/A;  . JOINT REPLACEMENT Right 2010   knee  . SKIN CANCER EXCISION      Prior to Admission medications   Medication Sig Start Date End Date Taking? Authorizing Provider  amoxicillin-clavulanate (AUGMENTIN) 875-125 MG tablet Take 1 tablet by mouth 2 (two) times daily for 14 days. 01/28/17 02/11/17  Delman Kitten, MD  aspirin EC 81 MG tablet Take 81 mg by mouth daily.    [provider]  atorvastatin (LIPITOR) 80 MG tablet Take 1 tablet (80 mg total) by mouth at bedtime. 02/01/16  [name], MD  clopidogrel (PLAVIX) 75 MG tablet Take 1 tablet (75 mg total) by mouth daily. 02/01/16   [name], MD  Coenzyme Q10 (CO Q 10) 10 MG CAPS Take by mouth daily.    [provider]  donepezil (ARICEPT) 5 MG tablet Take by mouth. 03/22/16 09/20/16  [provider]  ezetimibe (ZETIA) 10 MG tablet Take 1 tablet (10 mg total) by mouth daily. 02/01/16   [name], MD  furosemide (LASIX) 40 MG tablet  11/17/15   [provider]  glipiZIDE (GLUCOTROL) 5 MG tablet Take 1 tablet (5 mg total) by mouth  daily. 02/01/16   [name], MD  HYDROcodone-acetaminophen (NORCO/VICODIN) 5-325 MG tablet Take 0.5-1 tablets by mouth every 6 (six) hours as needed for moderate pain. 01/28/17   Delman Kitten, MD  isosorbide mononitrate (IMDUR) 30 MG 24 hr tablet  05/17/15   [provider]  JANUVIA 25 MG tablet Take 1 tablet (25 mg total) by mouth daily. 02/01/16   [name], MD  metoprolol succinate (TOPROL-XL) 25 MG 24 hr tablet Take 1 tablet (25 mg total) by mouth daily. 02/01/16   [name], MD  mometasone (ELOCON) 0.1 % lotion APPLY 4 DROPS INTO EACH EAR DAILY FOR 10 DAYS THEN AS NEEDED FOR DRY SKIN AND ITCHING 04/23/15   [provider]  Multiple Vitamin (MULTIVITAMIN) tablet Take 1 tablet by mouth daily.    [provider]  olmesartan (BENICAR) 20 MG tablet Take by mouth.    [provider]  Omega-3 Fatty Acids (FISH OIL) 1000 MG CAPS Take 1,000 mg by mouth daily.    [provider]  timolol (TIMOPTIC) 0.5 % ophthalmic solution Place 1 drop into both eyes 2 (two) times daily.    [provider]  TURMERIC PO Take 1,300 mg by mouth.    [provider]    Allergies Bee venom and Ivp dye [iodinated diagnostic agents]  Family History  Problem Relation Age of Onset  . Stroke Mother   . Diabetes Mother   . Diabetes Sister   . Diabetes Maternal Grandmother     Social History Social History   Tobacco Use  . Smoking status: Former Smoker    Last attempt to quit: 09/02/1964    Years since quitting: 52.4  . Smokeless tobacco: Never Used  Substance Use Topics  . Alcohol use: No    Alcohol/week: 0.0 oz  . Drug use: No    Review of Systems Constitutional: No fever/chills Eyes: No visual changes. ENT: No sore throat.  See HPI Cardiovascular: Denies chest pain. Respiratory: Denies shortness of breath. Gastrointestinal: No abdominal pain.  No nausea, no vomiting.  No diarrhea.  No constipation. Genitourinary: Negative  for dysuria. Musculoskeletal: Negative for back pain. Skin: Negative for rash. Neurological: Negative for headaches, focal weakness or numbness.    ____________________________________________   PHYSICAL EXAM:  VITAL SIGNS: ED Triage Vitals [01/27/17 2359]  Enc Vitals Group     BP      Pulse      Resp      Temp      Temp src      SpO2      Weight 171 lb (77.6 kg)     Height 5\' 7"  (1.702 m)     Head Circumference      Peak Flow      Pain Score      Pain Loc      Pain  Edu?      Excl. in [place]?     Constitutional: Alert and oriented. Well appearing and in no acute distress.  HEENT is wife both very pleasant. Eyes: Conjunctivae are normal. Head: Atraumatic. Nose: Left knee ear appears normal, no blood within it.  The right nare has notable blood, clot, and some active oozing of bright red blood.  After suctioning, clearing the nose, applying Afrin, and clamping for about 30 minutes the patient continued to have ongoing bleeding which does not appear to be obviously anterior in nature, suspect is likely bleeding from somewhere around the septum on the right side. Mouth/Throat: Mucous membranes are moist. Neck: No stridor.  No cervical tenderness.  Full range of motion of neck without any pain or discomfort. Cardiovascular: Normal rate, regular rhythm. Grossly normal heart sounds.  Good peripheral circulation. Respiratory: Normal respiratory effort.  No retractions. Lungs CTAB. Gastrointestinal: Soft and nontender. No distention. Musculoskeletal: No lower extremity tenderness nor edema. Neurologic:  Normal speech and language. No gross focal neurologic deficits are appreciated.  Skin:  Skin is warm, dry and intact. No rash noted. Psychiatric: Mood and affect are normal. Speech and behavior are normal.  ____________________________________________   LABS (all labs ordered are listed, but only abnormal results are displayed)  Labs Reviewed - No data to  display ____________________________________________  EKG   ____________________________________________  RADIOLOGY  Ct Head Wo Contrast  Result Date: 01/28/2017 CLINICAL DATA:  83 year old male presenting with epistaxis status post fall. Patient is on anti coagulation. EXAM: CT HEAD WITHOUT CONTRAST CT MAXILLOFACIAL WITHOUT CONTRAST TECHNIQUE: Multidetector CT imaging of the head and maxillofacial structures were performed using the standard protocol without intravenous contrast. Multiplanar CT image reconstructions of the maxillofacial structures were also generated. COMPARISON:  04/03/2016 MRI, 04/10/2009 CT FINDINGS: CT HEAD FINDINGS Brain: Chronic stable superficial and central atrophy with minimal small vessel ischemic disease. No large vascular territory infarction, intracranial hemorrhage, midline shift or edema. No hydrocephalus. No intra-axial mass nor extra-axial fluid collections. Vascular: No hyperdense vessels. 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 No fractures identified. Other: None. CT MAXILLOFACIAL FINDINGS Osseous: Intact temporomandibular joints. Intact zygomatic arches. Intact nasal bone and anterior maxillary process. No mandibular fracture. Intact pterygoid plates. Orbital walls are intact. Lucency at the apex of the left upper second premolar consistent with periodontal disease. There is cervical spondylosis. Orbits: Intact orbits and globes. No retrobulbar hemorrhage. Optic nerves are symmetric in appearance. Bilateral lens replacements. Sinuses: Chronic sphenoid opacity on the left with inspissated mucus accounting for internal hyperdensities noted. Chronic mild left maxillary sinus mucosal thickening. Clear frontal sinus. Air-fluid levels are seen within the right maxillary and right sphenoid sinus which likely represent stigmata of epistaxis  given soft tissue densities within the right nasal passage and nasopharynx. Mild ethmoid sinus mucosal thickening Soft tissues: Negative. IMPRESSION: 1. Chronic cerebral atrophy with small vessel ischemia. No acute intracranial abnormality. 2. Chronic high frontal skull defect stable since 2011 likely to represent a postsurgical defect. 3. Soft tissue densities consistent with history of epistaxis within the right nasal passage with air-fluid levels in the right maxillary and right sphenoid sinuses. No paranasal sinus nor orbital fracture. Chronic sphenoid sinusitis with desiccated mucus on the left. 4. No acute maxillofacial fracture. Electronically Signed   By: Ashley Royalty M.D.   On: 01/28/2017 01:02   Ct Maxillofacial Wo Contrast  Result Date: 01/28/2017 CLINICAL DATA:  83 year old male presenting with epistaxis status post fall. Patient is on anti coagulation. EXAM: CT HEAD WITHOUT CONTRAST CT MAXILLOFACIAL WITHOUT CONTRAST TECHNIQUE: Multidetector CT imaging of the head and maxillofacial structures were performed using the standard protocol without intravenous contrast. Multiplanar CT image reconstructions of the maxillofacial structures were also generated. COMPARISON:  04/03/2016 MRI, 04/10/2009 CT FINDINGS: CT HEAD FINDINGS Brain: Chronic stable superficial and central atrophy with minimal small vessel ischemic disease. No large vascular territory infarction, intracranial hemorrhage, midline shift or edema. No hydrocephalus. No intra-axial mass nor extra-axial fluid collections. Vascular: No hyperdense vessels. 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 No fractures identified. Other: None. CT MAXILLOFACIAL FINDINGS Osseous: Intact temporomandibular joints. Intact zygomatic arches. Intact nasal bone and anterior maxillary process. No mandibular fracture. Intact pterygoid plates.  Orbital walls are intact. Lucency at the apex of the left upper second premolar consistent with periodontal disease. There is cervical spondylosis. Orbits: Intact orbits and globes. No retrobulbar hemorrhage. Optic nerves are symmetric in appearance. Bilateral lens replacements. Sinuses: Chronic sphenoid opacity on the left with inspissated mucus accounting for internal hyperdensities noted. Chronic mild left maxillary sinus mucosal thickening. Clear frontal sinus. Air-fluid levels are seen within the right maxillary and right sphenoid sinus which likely represent stigmata of epistaxis given soft tissue densities within the right nasal passage and nasopharynx. Mild ethmoid sinus mucosal thickening Soft tissues: Negative. IMPRESSION: 1. Chronic cerebral atrophy with small vessel ischemia. No acute intracranial abnormality. 2. Chronic high frontal skull defect stable since 2011 likely to represent a postsurgical defect. 3. Soft tissue densities consistent with history of epistaxis within the right nasal passage with air-fluid levels in the right maxillary and right sphenoid sinuses. No paranasal sinus nor orbital fracture. Chronic sphenoid sinusitis with desiccated mucus on the left. 4. No acute maxillofacial fracture. Electronically Signed   By: Ashley Royalty M.D.   On: 01/28/2017 01:02   CT of the head reviewed, no acute intracranial abnormality.  He has a chronic skull defect which is stable.  Notable soft tissue within the right nare with air-fluid levels in the maxillary and sphenoid sinuses. ____________________________________________   PROCEDURES  Procedure(s) performed: Epistaxis management epistaxis management  .Epistaxis Management Date/Time: 01/28/2017 2:57 AM Performed by: Delman Kitten, MD Authorized by: Delman Kitten, MD   Consent:    Consent obtained:  Verbal   Consent given by:  Patient   Risks discussed:  Bleeding and nasal injury   Alternatives discussed:  Observation Anesthesia (see  MAR for exact dosages):    Anesthesia method:  None Procedure details:    Treatment site:  R posterior and R anterior (rhino rocket)   Treatment method:  Nasal balloon   Treatment complexity:  Limited   Treatment episode: initial   Post-procedure details:    Assessment:  Bleeding stopped   Patient tolerance of procedure:  Tolerated well, no immediate complications     nasal packing  Critical Care performed: No  ____________________________________________   INITIAL IMPRESSION / ASSESSMENT AND PLAN / ED COURSE  Pertinent labs & imaging results that were available during my care of the patient were reviewed by me and considered in my medical decision making (see chart for details).  Patient presents after tripping and falling.  Striking his head.  He is on Plavix, the CT of the head was performed as well as max face for bleeding from the right nare.  After placing a  Rhino Rocket, bleeding was well controlled.  Patient denies any neurologic symptoms, no alcohol or drug use tonight.  Nexus negative for C-spine imaging.  Denies any other associated symptoms, no cardiac or pulmonary symptoms, no neurologic symptoms.  Reassuring examination with exception of bleeding from his right nose that needs to be addressed.  Clinical Course as of Jan 28 300  Nancy Fetter Jan 28, 2017  0229 Reevaluation, patient resting comfortably.  Does report he feels a sinus fullness, and some discomfort requesting a half a pain tablet. I will prescribe the patient a narcotic pain medicine due to their condition which I anticipate will cause at least moderate pain short term. I discussed with the patient safe use of narcotic pain medicines, and that they are not to drive, work in dangerous areas, or ever take more than prescribed (no more than 1 pill every 6 hours). We discussed that this is the type of medication that can be  overdosed on and the risks of this type of medicine. Patient is very agreeable to only use as  prescribed and to never use more than prescribed.  No ongoing bleeding.  No bleeding in the back of her throat or from the nares.  Reports he does feel much better overall with no evidence of bleeding.  Appears appropriate for discharge and will follow up with [facility] ear nose and throat doctor Dr. Pryor Ochoa this week  [MQ]    Clinical Course User Index [MQ] Delman Kitten, MD    Return precautions and treatment recommendations and follow-up discussed with the patient who is agreeable with the plan.  ____________________________________________   FINAL CLINICAL IMPRESSION(S) / ED DIAGNOSES  Final diagnoses:  Closed head injury, initial encounter  Fall, initial encounter  Right-sided epistaxis      NEW MEDICATIONS STARTED DURING THIS VISIT:  This SmartLink is deprecated. Use AVSMEDLIST instead to display the medication list for a patient.   Note:  This document was prepared using Dragon voice recognition software and may include unintentional dictation errors.     Delman Kitten, MD 01/28/17 [phone]

## 2017-01-28 NOTE — ED Notes (Signed)
Yellow fall risk bracelet placed on pt while waiting in the lobby; pt and visitor know pt is not to get up without assistance from staff

## 2017-01-28 NOTE — Discharge Instructions (Addendum)
Follow-up with Dr. Tami Ribas of ear nose and throat in 4-5 days for evaluation and removal of your packing  Return to the emergency room if you begin to have more bleeding from the nose, confusion, severe headache, neck pain, trouble breathing, vomiting, fever or other new concerns arise.  No driving while taking hydrocodone, only use as prescribed.

## 2017-01-28 NOTE — ED Notes (Signed)
Pt in CT at this time.

## 2017-02-02 DIAGNOSIS — R040 Epistaxis: Secondary | ICD-10-CM | POA: Diagnosis not present

## 2017-02-02 DIAGNOSIS — R42 Dizziness and giddiness: Secondary | ICD-10-CM | POA: Diagnosis not present

## 2017-02-14 ENCOUNTER — Ambulatory Visit (INDEPENDENT_AMBULATORY_CARE_PROVIDER_SITE_OTHER): Payer: Medicare Other | Admitting: Gastroenterology

## 2017-02-14 ENCOUNTER — Encounter: Payer: Self-pay | Admitting: Gastroenterology

## 2017-02-14 VITALS — BP 158/68 | HR 66 | Ht 67.0 in | Wt 170.0 lb

## 2017-02-14 DIAGNOSIS — R1013 Epigastric pain: Secondary | ICD-10-CM | POA: Diagnosis not present

## 2017-02-14 DIAGNOSIS — G8929 Other chronic pain: Secondary | ICD-10-CM

## 2017-02-14 NOTE — Progress Notes (Signed)
Primary Care Physician: [name], MD  Primary Gastroenterologist:  Dr. Lucilla Lame  Chief Complaint  Patient presents with  . Abdominal Pain    HPI: Ray Lamb is a 84 y.o. male here for report of abdominal pain. The patient reports that his abdominal pain is shortly after he drinks in the morning or when he walks to the now proximal after eating. The patient was seen by his cardiologist who was concerned about his heart. The patient wanted to see me first because she has a history of a large hiatal hernia. The patient also reports that when he walks to the mailbox after eating he gets short of breath. There is no report of any black stools or bloody stools. The patient did have an upper endoscopy with a large hiatal hernia seen back in March of this year. There is no report of any on unexplained weight loss fevers chills nausea or vomiting.  Current Outpatient Medications  Medication Sig Dispense Refill  . aspirin EC 81 MG tablet Take 81 mg by mouth daily.    Marland Kitchen atorvastatin (LIPITOR) 80 MG tablet Take 1 tablet (80 mg total) by mouth at bedtime. 90 tablet 4  . clopidogrel (PLAVIX) 75 MG tablet Take 1 tablet (75 mg total) by mouth daily. 90 tablet 4  . ezetimibe (ZETIA) 10 MG tablet Take 1 tablet (10 mg total) by mouth daily. 90 tablet 4  . furosemide (LASIX) 40 MG tablet     . glipiZIDE (GLUCOTROL) 5 MG tablet Take 1 tablet (5 mg total) by mouth daily. 90 tablet 4  . HYDROcodone-acetaminophen (NORCO/VICODIN) 5-325 MG tablet Take 0.5-1 tablets by mouth every 6 (six) hours as needed for moderate pain. 10 tablet 0  . isosorbide mononitrate (IMDUR) 30 MG 24 hr tablet     . JANUVIA 25 MG tablet Take 1 tablet (25 mg total) by mouth daily. 90 tablet 4  . metoprolol succinate (TOPROL-XL) 25 MG 24 hr tablet Take 1 tablet (25 mg total) by mouth daily. 90 tablet 4  . mometasone (ELOCON) 0.1 % lotion APPLY 4 DROPS INTO EACH EAR DAILY FOR 10 DAYS THEN AS NEEDED FOR DRY SKIN AND ITCHING  12    . Multiple Vitamin (MULTIVITAMIN) tablet Take 1 tablet by mouth daily.    Marland Kitchen olmesartan (BENICAR) 20 MG tablet Take by mouth.    . Omega-3 Fatty Acids (FISH OIL) 1000 MG CAPS Take 1,000 mg by mouth daily.    . timolol (TIMOPTIC) 0.5 % ophthalmic solution Place 1 drop into both eyes 2 (two) times daily.    . TURMERIC PO Take 1,300 mg by mouth.    . Coenzyme Q10 (CO Q 10) 10 MG CAPS Take by mouth daily.    Marland Kitchen donepezil (ARICEPT) 5 MG tablet Take by mouth.     No current facility-administered medications for this visit.     Allergies as of 02/14/2017 - Review Complete 02/14/2017  Allergen Reaction Noted  . Bee venom  09/02/2014  . Ivp dye [iodinated diagnostic agents]  09/02/2014    ROS:  General: Negative for anorexia, weight loss, fever, chills, fatigue, weakness. ENT: Negative for hoarseness, difficulty swallowing , nasal congestion. CV: Negative for chest pain, angina, palpitations, dyspnea on exertion, peripheral edema.  Respiratory: Negative for dyspnea at rest, dyspnea on exertion, cough, sputum, wheezing.  GI: See history of present illness. GU:  Negative for dysuria, hematuria, urinary incontinence, urinary frequency, nocturnal urination.  Endo: Negative for unusual weight change.  Physical Examination:   BP (!) 158/68   Pulse 66   Ht 5\' 7"  (1.702 m)   Wt 170 lb (77.1 kg)   BMI 26.63 kg/m   General: Well-nourished, well-developed in no acute distress.  Eyes: No icterus. Conjunctivae pink. Mouth: Oropharyngeal mucosa moist and pink , no lesions erythema or exudate. Lungs: Clear to auscultation bilaterally. Non-labored. Heart: Regular rate and rhythm, no murmurs rubs or gallops.  Abdomen: Bowel sounds are normal, nontender, nondistended, no hepatosplenomegaly or masses, no abdominal bruits or hernia , no rebound or guarding.   Extremities: No lower extremity edema. No clubbing or deformities. Neuro: Alert and oriented x 3.  Grossly intact. Skin: Warm and dry, no  jaundice.   Psych: Alert and cooperative, normal mood and affect.  Labs:    Imaging Studies: Ct Head Wo Contrast  Result Date: 01/28/2017 CLINICAL DATA:  84 year old male presenting with epistaxis status post fall. Patient is on anti coagulation. EXAM: CT HEAD WITHOUT CONTRAST CT MAXILLOFACIAL WITHOUT CONTRAST TECHNIQUE: Multidetector CT imaging of the head and maxillofacial structures were performed using the standard protocol without intravenous contrast. Multiplanar CT image reconstructions of the maxillofacial structures were also generated. COMPARISON:  04/03/2016 MRI, 04/10/2009 CT FINDINGS: CT HEAD FINDINGS Brain: Chronic stable superficial and central atrophy with minimal small vessel ischemic disease. No large vascular territory infarction, intracranial hemorrhage, midline shift or edema. No hydrocephalus. No intra-axial mass nor extra-axial fluid collections. Vascular: No hyperdense vessels. 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 No fractures identified. Other: None. CT MAXILLOFACIAL FINDINGS Osseous: Intact temporomandibular joints. Intact zygomatic arches. Intact nasal bone and anterior maxillary process. No mandibular fracture. Intact pterygoid plates. Orbital walls are intact. Lucency at the apex of the left upper second premolar consistent with periodontal disease. There is cervical spondylosis. Orbits: Intact orbits and globes. No retrobulbar hemorrhage. Optic nerves are symmetric in appearance. Bilateral lens replacements. Sinuses: Chronic sphenoid opacity on the left with inspissated mucus accounting for internal hyperdensities noted. Chronic mild left maxillary sinus mucosal thickening. Clear frontal sinus. Air-fluid levels are seen within the right maxillary and right sphenoid sinus which likely represent stigmata of epistaxis given soft tissue densities  within the right nasal passage and nasopharynx. Mild ethmoid sinus mucosal thickening Soft tissues: Negative. IMPRESSION: 1. Chronic cerebral atrophy with small vessel ischemia. No acute intracranial abnormality. 2. Chronic high frontal skull defect stable since 2011 likely to represent a postsurgical defect. 3. Soft tissue densities consistent with history of epistaxis within the right nasal passage with air-fluid levels in the right maxillary and right sphenoid sinuses. No paranasal sinus nor orbital fracture. Chronic sphenoid sinusitis with desiccated mucus on the left. 4. No acute maxillofacial fracture. Electronically Signed   By: Ashley Royalty M.D.   On: 01/28/2017 01:02   Ct Maxillofacial Wo Contrast  Result Date: 01/28/2017 CLINICAL DATA:  84 year old male presenting with epistaxis status post fall. Patient is on anti coagulation. EXAM: CT HEAD WITHOUT CONTRAST CT MAXILLOFACIAL WITHOUT CONTRAST TECHNIQUE: Multidetector CT imaging of the head and maxillofacial structures were performed using the standard protocol without intravenous contrast. Multiplanar CT image reconstructions of the maxillofacial structures were also generated. COMPARISON:  04/03/2016 MRI, 04/10/2009 CT FINDINGS: CT HEAD FINDINGS Brain: Chronic stable superficial and central atrophy with minimal small vessel ischemic disease. No large vascular territory infarction, intracranial hemorrhage, midline shift or edema. No hydrocephalus. No intra-axial mass nor extra-axial fluid collections. Vascular: No  hyperdense vessels. 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 No fractures identified. Other: None. CT MAXILLOFACIAL FINDINGS Osseous: Intact temporomandibular joints. Intact zygomatic arches. Intact nasal bone and anterior maxillary process. No mandibular fracture. Intact pterygoid plates. Orbital walls are intact.  Lucency at the apex of the left upper second premolar consistent with periodontal disease. There is cervical spondylosis. Orbits: Intact orbits and globes. No retrobulbar hemorrhage. Optic nerves are symmetric in appearance. Bilateral lens replacements. Sinuses: Chronic sphenoid opacity on the left with inspissated mucus accounting for internal hyperdensities noted. Chronic mild left maxillary sinus mucosal thickening. Clear frontal sinus. Air-fluid levels are seen within the right maxillary and right sphenoid sinus which likely represent stigmata of epistaxis given soft tissue densities within the right nasal passage and nasopharynx. Mild ethmoid sinus mucosal thickening Soft tissues: Negative. IMPRESSION: 1. Chronic cerebral atrophy with small vessel ischemia. No acute intracranial abnormality. 2. Chronic high frontal skull defect stable since 2011 likely to represent a postsurgical defect. 3. Soft tissue densities consistent with history of epistaxis within the right nasal passage with air-fluid levels in the right maxillary and right sphenoid sinuses. No paranasal sinus nor orbital fracture. Chronic sphenoid sinusitis with desiccated mucus on the left. 4. No acute maxillofacial fracture. Electronically Signed   By: Ashley Royalty M.D.   On: 01/28/2017 01:02    Assessment and Plan:   Ray Lamb is a 84 y.o. y/o male who comes in with a history of a large hiatal hernia with intermittent abdominal and chest pain. The patient states it is not present when he drinks slowly. His shortness of breath may be related to his large hiatal hernia although a cardiac issue should be kept in mind. The patient has been told to avoid eating too fast or large amounts 1 time with his large hiatal hernia. The patient states he understands the plan and will contact me if his symptoms get worse. The patient is not a candidate at his age for repair of his hiatal hernia.    Lucilla Lame, MD. Marval Regal   Note: This dictation was  prepared with Dragon dictation along with smaller phrase technology. Any transcriptional errors that result from this process are unintentional.

## 2017-02-19 ENCOUNTER — Other Ambulatory Visit: Payer: Self-pay | Admitting: Family Medicine

## 2017-02-19 DIAGNOSIS — E1122 Type 2 diabetes mellitus with diabetic chronic kidney disease: Secondary | ICD-10-CM

## 2017-02-19 DIAGNOSIS — E7800 Pure hypercholesterolemia, unspecified: Secondary | ICD-10-CM

## 2017-02-19 DIAGNOSIS — N184 Chronic kidney disease, stage 4 (severe): Secondary | ICD-10-CM

## 2017-02-19 DIAGNOSIS — I1310 Hypertensive heart and chronic kidney disease without heart failure, with stage 1 through stage 4 chronic kidney disease, or unspecified chronic kidney disease: Secondary | ICD-10-CM

## 2017-02-22 ENCOUNTER — Ambulatory Visit: Payer: Medicare Other | Admitting: Gastroenterology

## 2017-02-26 ENCOUNTER — Ambulatory Visit: Payer: Medicare Other | Admitting: Family Medicine

## 2017-02-26 ENCOUNTER — Encounter: Payer: Self-pay | Admitting: Family Medicine

## 2017-02-26 ENCOUNTER — Ambulatory Visit (INDEPENDENT_AMBULATORY_CARE_PROVIDER_SITE_OTHER): Payer: Medicare Other | Admitting: Family Medicine

## 2017-02-26 VITALS — BP 150/50 | HR 73 | Ht 69.0 in | Wt 169.4 lb

## 2017-02-26 DIAGNOSIS — E785 Hyperlipidemia, unspecified: Secondary | ICD-10-CM | POA: Diagnosis not present

## 2017-02-26 DIAGNOSIS — Z1329 Encounter for screening for other suspected endocrine disorder: Secondary | ICD-10-CM

## 2017-02-26 DIAGNOSIS — G300 Alzheimer's disease with early onset: Secondary | ICD-10-CM | POA: Diagnosis not present

## 2017-02-26 DIAGNOSIS — F0280 Dementia in other diseases classified elsewhere without behavioral disturbance: Secondary | ICD-10-CM

## 2017-02-26 DIAGNOSIS — E1122 Type 2 diabetes mellitus with diabetic chronic kidney disease: Secondary | ICD-10-CM

## 2017-02-26 DIAGNOSIS — Z0001 Encounter for general adult medical examination with abnormal findings: Secondary | ICD-10-CM

## 2017-02-26 DIAGNOSIS — E7800 Pure hypercholesterolemia, unspecified: Secondary | ICD-10-CM | POA: Diagnosis not present

## 2017-02-26 DIAGNOSIS — N184 Chronic kidney disease, stage 4 (severe): Secondary | ICD-10-CM

## 2017-02-26 DIAGNOSIS — D692 Other nonthrombocytopenic purpura: Secondary | ICD-10-CM | POA: Diagnosis not present

## 2017-02-26 DIAGNOSIS — Z7189 Other specified counseling: Secondary | ICD-10-CM

## 2017-02-26 DIAGNOSIS — I1310 Hypertensive heart and chronic kidney disease without heart failure, with stage 1 through stage 4 chronic kidney disease, or unspecified chronic kidney disease: Secondary | ICD-10-CM

## 2017-02-26 LAB — URINALYSIS, ROUTINE W REFLEX MICROSCOPIC
Bilirubin, UA: NEGATIVE
Glucose, UA: NEGATIVE
KETONES UA: NEGATIVE
Leukocytes, UA: NEGATIVE
NITRITE UA: NEGATIVE
SPEC GRAV UA: 1.015 (ref 1.005–1.030)
UUROB: 0.2 mg/dL (ref 0.2–1.0)
pH, UA: 5 (ref 5.0–7.5)

## 2017-02-26 LAB — MICROSCOPIC EXAMINATION: Bacteria, UA: NONE SEEN

## 2017-02-26 LAB — BAYER DCA HB A1C WAIVED: HB A1C: 8.1 % — AB (ref <7.0)

## 2017-02-26 MED ORDER — METOPROLOL SUCCINATE ER 25 MG PO TB24: 25.0000 mg | ORAL_TABLET | Freq: Every day | ORAL | 4 refills | Status: DC

## 2017-02-26 MED ORDER — CLOPIDOGREL BISULFATE 75 MG PO TABS: 75.0000 mg | ORAL_TABLET | Freq: Every day | ORAL | 4 refills | Status: DC

## 2017-02-26 MED ORDER — JANUVIA 25 MG PO TABS: 25.0000 mg | ORAL_TABLET | Freq: Every day | ORAL | 4 refills | Status: DC

## 2017-02-26 MED ORDER — EZETIMIBE 10 MG PO TABS: 10.0000 mg | ORAL_TABLET | Freq: Every day | ORAL | 4 refills | Status: DC

## 2017-02-26 MED ORDER — GLIPIZIDE 5 MG PO TABS: 5.0000 mg | ORAL_TABLET | Freq: Every day | ORAL | 4 refills | Status: DC

## 2017-02-26 MED ORDER — ATORVASTATIN CALCIUM 80 MG PO TABS: 80.0000 mg | ORAL_TABLET | Freq: Every day | ORAL | 4 refills | Status: DC

## 2017-02-26 NOTE — Assessment & Plan Note (Signed)
A voluntary discussion about advance care planning including the explanation and discussion of advance directives was extensively discussed  with the patient.  Explanation about the health care proxy and Living will was reviewed and packet with forms with explanation of how to fill them out was given.    

## 2017-02-26 NOTE — Assessment & Plan Note (Addendum)
Elevated but will not change meds. Do better with lifestyle

## 2017-02-26 NOTE — Assessment & Plan Note (Signed)
The current medical regimen is effective;  continue present plan and medications.  

## 2017-02-26 NOTE — Assessment & Plan Note (Signed)
stable °

## 2017-02-26 NOTE — Progress Notes (Signed)
BP (!) 150/50 (BP Location: Left Arm)   Pulse 73   Ht 5\' 9"  (1.753 m)   Wt 169 lb 6.4 oz (76.8 kg)   SpO2 99%   BMI 25.02 kg/m    Subjective:    Patient ID: Ray Lamb, male    DOB: Nov 10, 1930, 84 y.o.   MRN: [id number]  HPI: Ray Lamb is a 84 y.o. male  Annual exam  Patient did not take medication today. Patient otherwise doing well blood pressureordinarily does well along with diabetes noted low blood sugar spells. Takes other medicines for choleste and memory without issues. Relevant past medical, surgical, family and social history reviewed and updated as indicated. Interim medical history since our last visit reviewed. Allergies and medications reviewed and updated.  Review of Systems  Constitutional: Negative.   HENT: Negative.   Eyes: Negative.   Respiratory: Negative.   Cardiovascular: Negative.   Gastrointestinal: Negative.   Endocrine: Negative.   Genitourinary: Negative.   Musculoskeletal: Negative.   Skin: Negative.   Allergic/Immunologic: Negative.   Neurological: Negative.   Hematological: Negative.   Psychiatric/Behavioral: Negative.     Per HPI unless specifically indicated above     Objective:    BP (!) 150/50 (BP Location: Left Arm)   Pulse 73   Ht 5\' 9"  (1.753 m)   Wt 169 lb 6.4 oz (76.8 kg)   SpO2 99%   BMI 25.02 kg/m   Wt Readings from Last 3 Encounters:  02/26/17 169 lb 6.4 oz (76.8 kg)  02/14/17 170 lb (77.1 kg)  01/27/17 171 lb (77.6 kg)    Physical Exam  Constitutional: He is oriented to person, place, and time. He appears well-developed and well-nourished.  HENT:  Head: Normocephalic and atraumatic.  Right Ear: External ear normal.  Left Ear: External ear normal.  Eyes: Conjunctivae and EOM are normal. Pupils are equal, round, and reactive to light.  Neck: Normal range of motion. Neck supple.  Cardiovascular: Normal rate, regular rhythm and intact distal pulses.  2/6 SM  Pulmonary/Chest: Effort normal and breath sounds  normal.  Abdominal: Soft. Bowel sounds are normal. There is no splenomegaly or hepatomegaly.  Genitourinary: Rectum normal, prostate normal and penis normal.  Musculoskeletal: Normal range of motion.  Neurological: He is alert and oriented to person, place, and time. He has normal reflexes.  Skin: No rash noted. No erythema.  Psychiatric: He has a normal mood and affect. His behavior is normal. Judgment and thought content normal.    Results for orders placed or performed in visit on 12/11/16  Bayer DCA Hb A1c Waived  Result Value Ref Range   Bayer DCA Hb A1c Waived 7.5 (H) <4.0 %  Basic metabolic panel  Result Value Ref Range   Glucose 175 (H) 65 - 99 mg/dL   BUN 35 (H) 8 - 27 mg/dL   Creatinine, Ser 2.16 (H) 0.76 - 1.27 mg/dL   GFR calc non Af Amer 27 (L) >59 mL/min/1.73   GFR calc Af Amer 31 (L) >59 mL/min/1.73   BUN/Creatinine Ratio 16 10 - 24   Sodium 138 134 - 144 mmol/L   Potassium 4.7 3.5 - 5.2 mmol/L   Chloride 101 96 - 106 mmol/L   CO2 24 20 - 29 mmol/L   Calcium 9.8 8.6 - 10.2 mg/dL  LP+ALT+AST Piccolo, Waived  Result Value Ref Range   ALT (SGPT) Piccolo, Waived 20 10 - 47 U/L   AST (SGOT) Piccolo, Waived 35 11 - 38 U/L  Cholesterol Piccolo, Waived 111 <200 mg/dL   HDL Chol Piccolo, Waived 46 (L) >59 mg/dL   Triglycerides Piccolo,Waived 92 <150 mg/dL   Chol/HDL Ratio Piccolo,Waive 2.4 mg/dL   LDL Chol Calc Piccolo Waived 47 <100 mg/dL   VLDL Chol Calc Piccolo,Waive 18 <30 mg/dL      Assessment & Plan:   Problem List Items Addressed This Visit      Cardiovascular and Mediastinum   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   Relevant Medications   atorvastatin (LIPITOR) 80 MG tablet   clopidogrel (PLAVIX) 75 MG tablet   ezetimibe (ZETIA) 10 MG tablet   metoprolol succinate (TOPROL-XL) 25 MG 24 hr tablet   Other Relevant Orders   Bayer DCA Hb A1c Waived   CBC with Differential/Platelet   Comprehensive metabolic panel   Lipid panel   Urinalysis,  Routine w reflex microscopic   Senile purpura (HCC)    stable      Relevant Medications   atorvastatin (LIPITOR) 80 MG tablet   ezetimibe (ZETIA) 10 MG tablet   metoprolol succinate (TOPROL-XL) 25 MG 24 hr tablet     Endocrine   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 disease (HCC)    Elevated but will not change meds. Do better with lifestyle      Relevant Medications   atorvastatin (LIPITOR) 80 MG tablet   glipiZIDE (GLUCOTROL) 5 MG tablet   JANUVIA 25 MG tablet   Other Relevant Orders   Bayer DCA Hb A1c Waived   CBC with Differential/Platelet   Comprehensive metabolic panel   Lipid panel   Urinalysis, Routine w reflex microscopic     Nervous and Auditory   Alzheimer's dementia    The current medical regimen is effective;  continue present plan and medications.         Other   Hyperlipidemia    The current medical regimen is effective;  continue present plan and medications.       Relevant Medications   atorvastatin (LIPITOR) 80 MG tablet   ezetimibe (ZETIA) 10 MG tablet   metoprolol succinate (TOPROL-XL) 25 MG 24 hr tablet   Other Relevant Orders   Bayer DCA Hb A1c Waived   CBC with Differential/Platelet   Comprehensive metabolic panel   Lipid panel   Urinalysis, Routine w reflex microscopic   Advance care planning    A voluntary discussion about advance care planning including the explanation and discussion of advance directives was extensively discussed  with the patient.  Explanation about the health care proxy and Living will was reviewed and packet with forms with explanation of how to fill them out was given.         Other Visit Diagnoses    Thyroid disorder screen       Relevant Orders   TSH   Hypertensive heart and renal disease, stage 1-4 or unspecified chronic kidney disease, without heart failure       Relevant Medications   atorvastatin (LIPITOR) 80 MG tablet   clopidogrel (PLAVIX) 75 MG tablet   ezetimibe (ZETIA) 10 MG tablet    metoprolol succinate (TOPROL-XL) 25 MG 24 hr tablet       Follow up plan: Return in about 3 months (around 05/27/2017) for Hemoglobin A1c.

## 2017-02-27 LAB — COMPREHENSIVE METABOLIC PANEL
ALT: 18 IU/L (ref 0–44)
AST: 23 IU/L (ref 0–40)
Albumin/Globulin Ratio: 0.9 — ABNORMAL LOW (ref 1.2–2.2)
Albumin: 3.9 g/dL (ref 3.5–4.7)
Alkaline Phosphatase: 77 IU/L (ref 39–117)
BUN / CREAT RATIO: 23 (ref 10–24)
BUN: 58 mg/dL — AB (ref 8–27)
Bilirubin Total: 0.4 mg/dL (ref 0.0–1.2)
CO2: 27 mmol/L (ref 20–29)
CREATININE: 2.55 mg/dL — AB (ref 0.76–1.27)
Calcium: 9.3 mg/dL (ref 8.6–10.2)
Chloride: 98 mmol/L (ref 96–106)
GFR calc non Af Amer: 22 mL/min/{1.73_m2} — ABNORMAL LOW (ref >59)
GFR, EST AFRICAN AMERICAN: 26 mL/min/{1.73_m2} — AB (ref >59)
GLOBULIN, TOTAL: 4.4 g/dL (ref 1.5–4.5)
GLUCOSE: 207 mg/dL — AB (ref 65–99)
Potassium: 4.8 mmol/L (ref 3.5–5.2)
Sodium: 137 mmol/L (ref 134–144)
TOTAL PROTEIN: 8.3 g/dL (ref 6.0–8.5)

## 2017-02-27 LAB — CBC WITH DIFFERENTIAL/PLATELET
BASOS ABS: 0.1 10*3/uL (ref 0.0–0.2)
Basos: 1 %
EOS (ABSOLUTE): 0.3 10*3/uL (ref 0.0–0.4)
EOS: 4 %
HEMATOCRIT: 31.2 % — AB (ref 37.5–51.0)
HEMOGLOBIN: 9.8 g/dL — AB (ref 13.0–17.7)
IMMATURE GRANS (ABS): 0 10*3/uL (ref 0.0–0.1)
Immature Granulocytes: 0 %
LYMPHS: 37 %
Lymphocytes Absolute: 3 10*3/uL (ref 0.7–3.1)
MCH: 30.2 pg (ref 26.6–33.0)
MCHC: 31.4 g/dL — ABNORMAL LOW (ref 31.5–35.7)
MCV: 96 fL (ref 79–97)
MONOCYTES: 11 %
Monocytes Absolute: 0.9 10*3/uL (ref 0.1–0.9)
NEUTROS ABS: 3.8 10*3/uL (ref 1.4–7.0)
Neutrophils: 47 %
Platelets: 333 10*3/uL (ref 150–379)
RBC: 3.24 x10E6/uL — AB (ref 4.14–5.80)
RDW: 12.6 % (ref 12.3–15.4)
WBC: 8 10*3/uL (ref 3.4–10.8)

## 2017-02-27 LAB — LIPID PANEL
CHOL/HDL RATIO: 2.6 ratio (ref 0.0–5.0)
Cholesterol, Total: 113 mg/dL (ref 100–199)
HDL: 44 mg/dL (ref >39)
LDL CALC: 54 mg/dL (ref 0–99)
Triglycerides: 74 mg/dL (ref 0–149)
VLDL Cholesterol Cal: 15 mg/dL (ref 5–40)

## 2017-02-27 LAB — TSH: TSH: 3.04 u[IU]/mL (ref 0.450–4.500)

## 2017-03-23 DIAGNOSIS — I2510 Atherosclerotic heart disease of native coronary artery without angina pectoris: Secondary | ICD-10-CM | POA: Diagnosis not present

## 2017-03-23 DIAGNOSIS — I272 Pulmonary hypertension, unspecified: Secondary | ICD-10-CM | POA: Diagnosis not present

## 2017-03-23 DIAGNOSIS — I25810 Atherosclerosis of coronary artery bypass graft(s) without angina pectoris: Secondary | ICD-10-CM | POA: Diagnosis not present

## 2017-03-23 DIAGNOSIS — I340 Nonrheumatic mitral (valve) insufficiency: Secondary | ICD-10-CM | POA: Diagnosis not present

## 2017-03-23 DIAGNOSIS — E785 Hyperlipidemia, unspecified: Secondary | ICD-10-CM | POA: Diagnosis not present

## 2017-03-23 DIAGNOSIS — I10 Essential (primary) hypertension: Secondary | ICD-10-CM | POA: Diagnosis not present

## 2017-03-23 DIAGNOSIS — R0602 Shortness of breath: Secondary | ICD-10-CM | POA: Diagnosis not present

## 2017-03-23 DIAGNOSIS — I509 Heart failure, unspecified: Secondary | ICD-10-CM | POA: Diagnosis not present

## 2017-03-30 DIAGNOSIS — Z8673 Personal history of transient ischemic attack (TIA), and cerebral infarction without residual deficits: Secondary | ICD-10-CM | POA: Diagnosis not present

## 2017-03-30 DIAGNOSIS — G309 Alzheimer's disease, unspecified: Secondary | ICD-10-CM | POA: Diagnosis not present

## 2017-03-30 DIAGNOSIS — N184 Chronic kidney disease, stage 4 (severe): Secondary | ICD-10-CM | POA: Diagnosis not present

## 2017-03-30 DIAGNOSIS — F0150 Vascular dementia without behavioral disturbance: Secondary | ICD-10-CM | POA: Diagnosis not present

## 2017-03-30 DIAGNOSIS — F0280 Dementia in other diseases classified elsewhere without behavioral disturbance: Secondary | ICD-10-CM | POA: Diagnosis not present

## 2017-04-05 DIAGNOSIS — N184 Chronic kidney disease, stage 4 (severe): Secondary | ICD-10-CM | POA: Diagnosis not present

## 2017-04-05 DIAGNOSIS — R600 Localized edema: Secondary | ICD-10-CM | POA: Diagnosis not present

## 2017-04-05 DIAGNOSIS — E1122 Type 2 diabetes mellitus with diabetic chronic kidney disease: Secondary | ICD-10-CM | POA: Diagnosis not present

## 2017-04-05 DIAGNOSIS — I10 Essential (primary) hypertension: Secondary | ICD-10-CM | POA: Diagnosis not present

## 2017-04-05 DIAGNOSIS — E875 Hyperkalemia: Secondary | ICD-10-CM | POA: Diagnosis not present

## 2017-04-12 DIAGNOSIS — E875 Hyperkalemia: Secondary | ICD-10-CM | POA: Diagnosis not present

## 2017-04-12 DIAGNOSIS — N184 Chronic kidney disease, stage 4 (severe): Secondary | ICD-10-CM | POA: Diagnosis not present

## 2017-04-12 DIAGNOSIS — R600 Localized edema: Secondary | ICD-10-CM | POA: Diagnosis not present

## 2017-04-12 DIAGNOSIS — E1129 Type 2 diabetes mellitus with other diabetic kidney complication: Secondary | ICD-10-CM | POA: Diagnosis not present

## 2017-04-13 ENCOUNTER — Encounter: Payer: Self-pay | Admitting: Family Medicine

## 2017-04-13 DIAGNOSIS — I340 Nonrheumatic mitral (valve) insufficiency: Secondary | ICD-10-CM | POA: Diagnosis not present

## 2017-04-13 DIAGNOSIS — I25810 Atherosclerosis of coronary artery bypass graft(s) without angina pectoris: Secondary | ICD-10-CM | POA: Diagnosis not present

## 2017-04-13 DIAGNOSIS — I509 Heart failure, unspecified: Secondary | ICD-10-CM | POA: Diagnosis not present

## 2017-04-13 DIAGNOSIS — I10 Essential (primary) hypertension: Secondary | ICD-10-CM | POA: Diagnosis not present

## 2017-04-13 DIAGNOSIS — R0602 Shortness of breath: Secondary | ICD-10-CM | POA: Diagnosis not present

## 2017-04-13 DIAGNOSIS — E785 Hyperlipidemia, unspecified: Secondary | ICD-10-CM | POA: Diagnosis not present

## 2017-04-13 DIAGNOSIS — I2510 Atherosclerotic heart disease of native coronary artery without angina pectoris: Secondary | ICD-10-CM | POA: Diagnosis not present

## 2017-04-24 DIAGNOSIS — E119 Type 2 diabetes mellitus without complications: Secondary | ICD-10-CM | POA: Diagnosis not present

## 2017-04-24 LAB — HM DIABETES EYE EXAM

## 2017-04-27 DIAGNOSIS — I10 Essential (primary) hypertension: Secondary | ICD-10-CM | POA: Diagnosis not present

## 2017-04-27 DIAGNOSIS — I340 Nonrheumatic mitral (valve) insufficiency: Secondary | ICD-10-CM | POA: Diagnosis not present

## 2017-04-27 DIAGNOSIS — I2510 Atherosclerotic heart disease of native coronary artery without angina pectoris: Secondary | ICD-10-CM | POA: Diagnosis not present

## 2017-04-27 DIAGNOSIS — E785 Hyperlipidemia, unspecified: Secondary | ICD-10-CM | POA: Diagnosis not present

## 2017-04-27 DIAGNOSIS — N184 Chronic kidney disease, stage 4 (severe): Secondary | ICD-10-CM | POA: Diagnosis not present

## 2017-05-24 DIAGNOSIS — Z951 Presence of aortocoronary bypass graft: Secondary | ICD-10-CM | POA: Diagnosis not present

## 2017-05-24 DIAGNOSIS — K449 Diaphragmatic hernia without obstruction or gangrene: Secondary | ICD-10-CM | POA: Diagnosis not present

## 2017-05-24 DIAGNOSIS — Z8673 Personal history of transient ischemic attack (TIA), and cerebral infarction without residual deficits: Secondary | ICD-10-CM | POA: Diagnosis not present

## 2017-05-24 DIAGNOSIS — R0789 Other chest pain: Secondary | ICD-10-CM | POA: Diagnosis not present

## 2017-05-24 DIAGNOSIS — I361 Nonrheumatic tricuspid (valve) insufficiency: Secondary | ICD-10-CM | POA: Diagnosis not present

## 2017-05-24 DIAGNOSIS — Z87891 Personal history of nicotine dependence: Secondary | ICD-10-CM | POA: Diagnosis not present

## 2017-05-24 DIAGNOSIS — Z7901 Long term (current) use of anticoagulants: Secondary | ICD-10-CM | POA: Diagnosis not present

## 2017-05-24 DIAGNOSIS — Z7982 Long term (current) use of aspirin: Secondary | ICD-10-CM | POA: Diagnosis not present

## 2017-05-24 DIAGNOSIS — Z7984 Long term (current) use of oral hypoglycemic drugs: Secondary | ICD-10-CM | POA: Diagnosis not present

## 2017-05-24 DIAGNOSIS — E1122 Type 2 diabetes mellitus with diabetic chronic kidney disease: Secondary | ICD-10-CM | POA: Diagnosis not present

## 2017-05-24 DIAGNOSIS — N189 Chronic kidney disease, unspecified: Secondary | ICD-10-CM | POA: Diagnosis not present

## 2017-05-24 DIAGNOSIS — I2510 Atherosclerotic heart disease of native coronary artery without angina pectoris: Secondary | ICD-10-CM | POA: Diagnosis not present

## 2017-05-24 DIAGNOSIS — Z79899 Other long term (current) drug therapy: Secondary | ICD-10-CM | POA: Diagnosis not present

## 2017-05-24 DIAGNOSIS — Z952 Presence of prosthetic heart valve: Secondary | ICD-10-CM | POA: Diagnosis not present

## 2017-05-24 DIAGNOSIS — Z7902 Long term (current) use of antithrombotics/antiplatelets: Secondary | ICD-10-CM | POA: Diagnosis not present

## 2017-05-24 DIAGNOSIS — I340 Nonrheumatic mitral (valve) insufficiency: Secondary | ICD-10-CM | POA: Diagnosis not present

## 2017-06-05 ENCOUNTER — Ambulatory Visit (INDEPENDENT_AMBULATORY_CARE_PROVIDER_SITE_OTHER): Payer: Medicare Other | Admitting: Family Medicine

## 2017-06-05 ENCOUNTER — Encounter: Payer: Self-pay | Admitting: Family Medicine

## 2017-06-05 VITALS — BP 180/75 | HR 62 | Ht 69.0 in | Wt 164.0 lb

## 2017-06-05 DIAGNOSIS — F0280 Dementia in other diseases classified elsewhere without behavioral disturbance: Secondary | ICD-10-CM | POA: Diagnosis not present

## 2017-06-05 DIAGNOSIS — I340 Nonrheumatic mitral (valve) insufficiency: Secondary | ICD-10-CM | POA: Diagnosis not present

## 2017-06-05 DIAGNOSIS — D692 Other nonthrombocytopenic purpura: Secondary | ICD-10-CM

## 2017-06-05 DIAGNOSIS — E785 Hyperlipidemia, unspecified: Secondary | ICD-10-CM

## 2017-06-05 DIAGNOSIS — N184 Chronic kidney disease, stage 4 (severe): Secondary | ICD-10-CM | POA: Diagnosis not present

## 2017-06-05 DIAGNOSIS — I10 Essential (primary) hypertension: Secondary | ICD-10-CM | POA: Diagnosis not present

## 2017-06-05 DIAGNOSIS — G300 Alzheimer's disease with early onset: Secondary | ICD-10-CM | POA: Diagnosis not present

## 2017-06-05 DIAGNOSIS — I1310 Hypertensive heart and chronic kidney disease without heart failure, with stage 1 through stage 4 chronic kidney disease, or unspecified chronic kidney disease: Secondary | ICD-10-CM | POA: Diagnosis not present

## 2017-06-05 DIAGNOSIS — E1122 Type 2 diabetes mellitus with diabetic chronic kidney disease: Secondary | ICD-10-CM

## 2017-06-05 LAB — BAYER DCA HB A1C WAIVED: HB A1C (BAYER DCA - WAIVED): 8.5 % — ABNORMAL HIGH (ref <7.0)

## 2017-06-05 NOTE — Progress Notes (Signed)
BP (!) 180/75   Pulse 62   Ht 5\' 9"  (1.753 m)   Wt 164 lb (74.4 kg)   SpO2 98%   BMI 24.22 kg/m    Subjective:    Patient ID: Ray Lamb, male    DOB: [birth year], 84 y.o.   MRN: [id number]  HPI: Ray Lamb is a 84 y.o. male  Chief Complaint  Patient presents with  . Follow-up  . Diabetes  . Skin Problem    On L knee  Patient with multiple concerns. Primary concern is his heart valves. There was confusion between cardiac echo done at Dr. Sharol Roussel office which due to poor windows was a limited scan showing severe stenosis.  Follow-up echo at [facility] showed only mild stenosis of valves.  No surgical indication. Reviewed these results with patient. Patient's blood pressures been noted to be up from time to time concerned because of stage IV kidney disease. Patient also with diabetes poor control.   Relevant past medical, surgical, family and social history reviewed and updated as indicated. Interim medical history since our last visit reviewed. Allergies and medications reviewed and updated.  Review of Systems  Constitutional: Negative.   Respiratory: Negative.   Cardiovascular: Negative.     Per HPI unless specifically indicated above     Objective:    BP (!) 180/75   Pulse 62   Ht 5\' 9"  (1.753 m)   Wt 164 lb (74.4 kg)   SpO2 98%   BMI 24.22 kg/m   Wt Readings from Last 3 Encounters:  06/05/17 164 lb (74.4 kg)  02/26/17 169 lb 6.4 oz (76.8 kg)  02/14/17 170 lb (77.1 kg)    Physical Exam  Constitutional: He is oriented to person, place, and time. He appears well-developed and well-nourished.  HENT:  Head: Normocephalic and atraumatic.  Eyes: Conjunctivae and EOM are normal.  Neck: Normal range of motion.  Cardiovascular: Normal rate, regular rhythm and normal heart sounds.  Pulmonary/Chest: Effort normal and breath sounds normal.  Musculoskeletal: Normal range of motion.  Neurological: He is alert and oriented to person, place, and time.  Skin: No  erythema.  Psychiatric: He has a normal mood and affect. His behavior is normal. Judgment and thought content normal.    Results for orders placed or performed in visit on 04/27/17  HM DIABETES EYE EXAM  Result Value Ref Range   HM Diabetic Eye Exam No Retinopathy No Retinopathy      Assessment & Plan:   Problem List Items Addressed This Visit      Cardiovascular and Mediastinum   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    Poor control of hypertension.  With stage IV kidney disease patient seen nephrology in a couple of weeks.  Nephrology will adjust medication for blood pressure control.      Relevant Medications   digoxin (LANOXIN) 0.125 MG tablet   Other Relevant Orders   Bayer DCA Hb A1c Waived   Senile purpura (HCC)    stable      Relevant Medications   digoxin (LANOXIN) 0.125 MG tablet   Mitral insufficiency    US shows mild      Relevant Medications   digoxin (LANOXIN) 0.125 MG tablet   Hypertension    Poor control      Relevant Medications   digoxin (LANOXIN) 0.125 MG tablet     Endocrine   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 disease (HCC)    Poor control of diabetes  with limited treatment options may need to go to injectable medications. Patient will do better with diet as possible and if not better in 3 months will start injectables.      Relevant Orders   Bayer DCA Hb A1c Waived     Nervous and Auditory   Alzheimer's dementia    The current medical regimen is effective;  continue present plan and medications.         Other   Hyperlipidemia   Relevant Medications   digoxin (LANOXIN) 0.125 MG tablet   Other Relevant Orders   Bayer DCA Hb A1c Waived       Follow up plan: Return in about 3 months (around 09/04/2017) for Hemoglobin A1c, BMP,  Lipids, ALT, AST.

## 2017-06-05 NOTE — Assessment & Plan Note (Signed)
Poor control of hypertension.  With stage IV kidney disease patient seen nephrology in a couple of weeks.  Nephrology will adjust medication for blood pressure control.

## 2017-06-05 NOTE — Assessment & Plan Note (Signed)
Poor control of diabetes with limited treatment options may need to go to injectable medications. Patient will do better with diet as possible and if not better in 3 months will start injectables.

## 2017-06-05 NOTE — Assessment & Plan Note (Signed)
US shows mild

## 2017-06-05 NOTE — Assessment & Plan Note (Signed)
Poor control

## 2017-06-05 NOTE — Assessment & Plan Note (Signed)
The current medical regimen is effective;  continue present plan and medications.  

## 2017-06-05 NOTE — Assessment & Plan Note (Signed)
stable °

## 2017-06-18 DIAGNOSIS — R600 Localized edema: Secondary | ICD-10-CM | POA: Diagnosis not present

## 2017-06-18 DIAGNOSIS — I10 Essential (primary) hypertension: Secondary | ICD-10-CM | POA: Diagnosis not present

## 2017-06-18 DIAGNOSIS — N184 Chronic kidney disease, stage 4 (severe): Secondary | ICD-10-CM | POA: Diagnosis not present

## 2017-06-18 DIAGNOSIS — E1122 Type 2 diabetes mellitus with diabetic chronic kidney disease: Secondary | ICD-10-CM | POA: Diagnosis not present

## 2017-06-18 DIAGNOSIS — E875 Hyperkalemia: Secondary | ICD-10-CM | POA: Diagnosis not present

## 2017-06-21 DIAGNOSIS — I10 Essential (primary) hypertension: Secondary | ICD-10-CM | POA: Diagnosis not present

## 2017-06-21 DIAGNOSIS — R600 Localized edema: Secondary | ICD-10-CM | POA: Diagnosis not present

## 2017-06-21 DIAGNOSIS — E875 Hyperkalemia: Secondary | ICD-10-CM | POA: Diagnosis not present

## 2017-06-21 DIAGNOSIS — E1129 Type 2 diabetes mellitus with other diabetic kidney complication: Secondary | ICD-10-CM | POA: Diagnosis not present

## 2017-06-21 DIAGNOSIS — N184 Chronic kidney disease, stage 4 (severe): Secondary | ICD-10-CM | POA: Diagnosis not present

## 2017-06-26 DIAGNOSIS — R0602 Shortness of breath: Secondary | ICD-10-CM | POA: Diagnosis not present

## 2017-06-26 DIAGNOSIS — I69319 Unspecified symptoms and signs involving cognitive functions following cerebral infarction: Secondary | ICD-10-CM | POA: Diagnosis not present

## 2017-06-26 DIAGNOSIS — N184 Chronic kidney disease, stage 4 (severe): Secondary | ICD-10-CM | POA: Diagnosis not present

## 2017-06-26 DIAGNOSIS — Z8673 Personal history of transient ischemic attack (TIA), and cerebral infarction without residual deficits: Secondary | ICD-10-CM | POA: Diagnosis not present

## 2017-06-28 DIAGNOSIS — Z7984 Long term (current) use of oral hypoglycemic drugs: Secondary | ICD-10-CM | POA: Diagnosis not present

## 2017-06-28 DIAGNOSIS — E782 Mixed hyperlipidemia: Secondary | ICD-10-CM | POA: Diagnosis not present

## 2017-06-28 DIAGNOSIS — Z79899 Other long term (current) drug therapy: Secondary | ICD-10-CM | POA: Diagnosis not present

## 2017-06-28 DIAGNOSIS — E1165 Type 2 diabetes mellitus with hyperglycemia: Secondary | ICD-10-CM | POA: Diagnosis not present

## 2017-06-28 DIAGNOSIS — I25118 Atherosclerotic heart disease of native coronary artery with other forms of angina pectoris: Secondary | ICD-10-CM | POA: Diagnosis not present

## 2017-06-28 DIAGNOSIS — R0602 Shortness of breath: Secondary | ICD-10-CM | POA: Diagnosis not present

## 2017-06-28 DIAGNOSIS — E119 Type 2 diabetes mellitus without complications: Secondary | ICD-10-CM | POA: Diagnosis not present

## 2017-06-28 DIAGNOSIS — K449 Diaphragmatic hernia without obstruction or gangrene: Secondary | ICD-10-CM | POA: Diagnosis not present

## 2017-06-28 DIAGNOSIS — R9431 Abnormal electrocardiogram [ECG] [EKG]: Secondary | ICD-10-CM | POA: Diagnosis not present

## 2017-06-28 DIAGNOSIS — I272 Pulmonary hypertension, unspecified: Secondary | ICD-10-CM | POA: Diagnosis not present

## 2017-06-28 DIAGNOSIS — N184 Chronic kidney disease, stage 4 (severe): Secondary | ICD-10-CM | POA: Diagnosis not present

## 2017-06-28 DIAGNOSIS — I129 Hypertensive chronic kidney disease with stage 1 through stage 4 chronic kidney disease, or unspecified chronic kidney disease: Secondary | ICD-10-CM | POA: Diagnosis not present

## 2017-06-28 DIAGNOSIS — E1122 Type 2 diabetes mellitus with diabetic chronic kidney disease: Secondary | ICD-10-CM | POA: Diagnosis not present

## 2017-06-28 DIAGNOSIS — I10 Essential (primary) hypertension: Secondary | ICD-10-CM | POA: Diagnosis not present

## 2017-08-02 DIAGNOSIS — N184 Chronic kidney disease, stage 4 (severe): Secondary | ICD-10-CM | POA: Diagnosis not present

## 2017-08-02 DIAGNOSIS — I10 Essential (primary) hypertension: Secondary | ICD-10-CM | POA: Diagnosis not present

## 2017-08-02 DIAGNOSIS — E782 Mixed hyperlipidemia: Secondary | ICD-10-CM | POA: Diagnosis not present

## 2017-08-02 DIAGNOSIS — I272 Pulmonary hypertension, unspecified: Secondary | ICD-10-CM | POA: Diagnosis not present

## 2017-08-02 DIAGNOSIS — I25118 Atherosclerotic heart disease of native coronary artery with other forms of angina pectoris: Secondary | ICD-10-CM | POA: Diagnosis not present

## 2017-08-24 DIAGNOSIS — L821 Other seborrheic keratosis: Secondary | ICD-10-CM | POA: Diagnosis not present

## 2017-08-24 DIAGNOSIS — L570 Actinic keratosis: Secondary | ICD-10-CM | POA: Diagnosis not present

## 2017-08-24 DIAGNOSIS — Z08 Encounter for follow-up examination after completed treatment for malignant neoplasm: Secondary | ICD-10-CM | POA: Diagnosis not present

## 2017-08-24 DIAGNOSIS — D225 Melanocytic nevi of trunk: Secondary | ICD-10-CM | POA: Diagnosis not present

## 2017-08-24 DIAGNOSIS — D2271 Melanocytic nevi of right lower limb, including hip: Secondary | ICD-10-CM | POA: Diagnosis not present

## 2017-08-24 DIAGNOSIS — Z85828 Personal history of other malignant neoplasm of skin: Secondary | ICD-10-CM | POA: Diagnosis not present

## 2017-09-04 ENCOUNTER — Encounter: Payer: Self-pay | Admitting: Family Medicine

## 2017-09-04 ENCOUNTER — Ambulatory Visit (INDEPENDENT_AMBULATORY_CARE_PROVIDER_SITE_OTHER): Payer: Medicare Other | Admitting: Family Medicine

## 2017-09-04 VITALS — BP 136/63 | HR 59 | Ht 69.0 in | Wt 161.0 lb

## 2017-09-04 DIAGNOSIS — E785 Hyperlipidemia, unspecified: Secondary | ICD-10-CM | POA: Diagnosis not present

## 2017-09-04 DIAGNOSIS — K449 Diaphragmatic hernia without obstruction or gangrene: Secondary | ICD-10-CM | POA: Insufficient documentation

## 2017-09-04 DIAGNOSIS — N189 Chronic kidney disease, unspecified: Secondary | ICD-10-CM | POA: Insufficient documentation

## 2017-09-04 DIAGNOSIS — E1122 Type 2 diabetes mellitus with diabetic chronic kidney disease: Secondary | ICD-10-CM | POA: Diagnosis not present

## 2017-09-04 DIAGNOSIS — N184 Chronic kidney disease, stage 4 (severe): Secondary | ICD-10-CM | POA: Diagnosis not present

## 2017-09-04 DIAGNOSIS — I2510 Atherosclerotic heart disease of native coronary artery without angina pectoris: Secondary | ICD-10-CM | POA: Insufficient documentation

## 2017-09-04 DIAGNOSIS — I1310 Hypertensive heart and chronic kidney disease without heart failure, with stage 1 through stage 4 chronic kidney disease, or unspecified chronic kidney disease: Secondary | ICD-10-CM

## 2017-09-04 DIAGNOSIS — I129 Hypertensive chronic kidney disease with stage 1 through stage 4 chronic kidney disease, or unspecified chronic kidney disease: Secondary | ICD-10-CM | POA: Insufficient documentation

## 2017-09-04 DIAGNOSIS — I272 Pulmonary hypertension, unspecified: Secondary | ICD-10-CM | POA: Insufficient documentation

## 2017-09-04 DIAGNOSIS — E119 Type 2 diabetes mellitus without complications: Secondary | ICD-10-CM | POA: Insufficient documentation

## 2017-09-04 DIAGNOSIS — E782 Mixed hyperlipidemia: Secondary | ICD-10-CM | POA: Insufficient documentation

## 2017-09-04 LAB — LP+ALT+AST PICCOLO, WAIVED
ALT (SGPT) PICCOLO, WAIVED: 20 U/L (ref 10–47)
AST (SGOT) PICCOLO, WAIVED: 34 U/L (ref 11–38)
Chol/HDL Ratio Piccolo,Waive: 2.4 mg/dL
Cholesterol Piccolo, Waived: 106 mg/dL (ref <200)
HDL Chol Piccolo, Waived: 43 mg/dL — ABNORMAL LOW (ref >59)
LDL CHOL CALC PICCOLO WAIVED: 50 mg/dL (ref <100)
TRIGLYCERIDES PICCOLO,WAIVED: 64 mg/dL (ref <150)
VLDL CHOL CALC PICCOLO,WAIVE: 13 mg/dL (ref <30)

## 2017-09-04 LAB — BAYER DCA HB A1C WAIVED: HB A1C: 7 % — AB (ref <7.0)

## 2017-09-04 NOTE — Assessment & Plan Note (Signed)
The current medical regimen is effective;  continue present plan and medications.  

## 2017-09-04 NOTE — Assessment & Plan Note (Signed)
Diabetes doing much better patient's has been exercising eating better and has brought A1c down to 7 today. Concerned about kidney failure has appointment with nephrology coming up checking BMP today which will help and can assess blood pressure medicine with nephrology also.

## 2017-09-04 NOTE — Progress Notes (Signed)
BP 136/63   Pulse (!) 59   Ht 5\' 9"  (1.753 m)   Wt 161 lb (73 kg)   SpO2 98%   BMI 23.78 kg/m    Subjective:    Patient ID: Ray Lamb, male    DOB: [birth year], 84 y.o.   MRN: [id number]  HPI: Ray Lamb is a 84 y.o. male  Chief Complaint  Patient presents with  . Follow-up  . Hypertension  . Diabetes  . Hyperlipidemia   Patient all in all doing well except for chest pain after he eats which is not angina related but more hiatal hernia problems.  Treats with Tums and Prilosec. Has been to cardiology and blood pressures doing well with carvedilol.  No complaints reviewed home blood pressure log which is indicated with good blood pressure readings. Patient doing well with other problems cholesterol and diabetes.  No complaints. Stable reports from nephrology. Checking labs today.  Relevant past medical, surgical, family and social history reviewed and updated as indicated. Interim medical history since our last visit reviewed. Allergies and medications reviewed and updated.  Review of Systems  Constitutional: Negative.   Respiratory: Negative.   Cardiovascular: Negative.     Per HPI unless specifically indicated above     Objective:    BP 136/63   Pulse (!) 59   Ht 5\' 9"  (1.753 m)   Wt 161 lb (73 kg)   SpO2 98%   BMI 23.78 kg/m   Wt Readings from Last 3 Encounters:  09/04/17 161 lb (73 kg)  06/05/17 164 lb (74.4 kg)  02/26/17 169 lb 6.4 oz (76.8 kg)    Physical Exam  Constitutional: He is oriented to person, place, and time. He appears well-developed and well-nourished.  HENT:  Head: Normocephalic and atraumatic.  Eyes: Conjunctivae and EOM are normal.  Neck: Normal range of motion.  Cardiovascular: Normal rate and regular rhythm.  Murmur heard. Pulmonary/Chest: Effort normal and breath sounds normal.  Musculoskeletal: Normal range of motion.  Neurological: He is alert and oriented to person, place, and time.  Skin: No erythema.  Psychiatric: He  has a normal mood and affect. His behavior is normal. Judgment and thought content normal.    Results for orders placed or performed in visit on 06/05/17  Bayer DCA Hb A1c Waived  Result Value Ref Range   HB A1C (BAYER DCA - WAIVED) 8.5 (H) <7.0 %      Assessment & Plan:   Problem List Items Addressed This Visit      Cardiovascular and Mediastinum   Hypertensive heart and renal disease    The current medical regimen is effective;  continue present plan and medications.       Relevant Medications   carvedilol (COREG) 25 MG tablet     Endocrine   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 disease ([place]) - Primary    Diabetes doing much better patient's has been exercising eating better and has brought A1c down to 7 today. Concerned about kidney failure has appointment with nephrology coming up checking BMP today which will help and can assess blood pressure medicine with nephrology also.      Relevant Orders   Basic metabolic panel   Bayer DCA Hb A1c Waived     Genitourinary   Chronic kidney disease   Relevant Orders   Basic metabolic panel     Other   Hyperlipidemia    The current medical regimen is effective;  continue present plan and medications.  Relevant Medications   carvedilol (COREG) 25 MG tablet   Other Relevant Orders   LP+ALT+AST [place], Waived       Follow up plan: Return in about 3 months (around 12/05/2017) for Hemoglobin A1c.

## 2017-09-05 ENCOUNTER — Encounter: Payer: Self-pay | Admitting: Family Medicine

## 2017-09-05 LAB — BASIC METABOLIC PANEL
BUN/Creatinine Ratio: 17 (ref 10–24)
BUN: 42 mg/dL — ABNORMAL HIGH (ref 8–27)
CO2: 23 mmol/L (ref 20–29)
CREATININE: 2.44 mg/dL — AB (ref 0.76–1.27)
Calcium: 9.4 mg/dL (ref 8.6–10.2)
Chloride: 102 mmol/L (ref 96–106)
GFR calc Af Amer: 27 mL/min/{1.73_m2} — ABNORMAL LOW (ref >59)
GFR, EST NON AFRICAN AMERICAN: 23 mL/min/{1.73_m2} — AB (ref >59)
GLUCOSE: 148 mg/dL — AB (ref 65–99)
Potassium: 6.1 mmol/L — ABNORMAL HIGH (ref 3.5–5.2)
Sodium: 137 mmol/L (ref 134–144)

## 2017-09-13 DIAGNOSIS — N184 Chronic kidney disease, stage 4 (severe): Secondary | ICD-10-CM | POA: Diagnosis not present

## 2017-09-13 DIAGNOSIS — E1122 Type 2 diabetes mellitus with diabetic chronic kidney disease: Secondary | ICD-10-CM | POA: Diagnosis not present

## 2017-09-13 DIAGNOSIS — E875 Hyperkalemia: Secondary | ICD-10-CM | POA: Diagnosis not present

## 2017-09-13 DIAGNOSIS — R600 Localized edema: Secondary | ICD-10-CM | POA: Diagnosis not present

## 2017-09-13 DIAGNOSIS — I10 Essential (primary) hypertension: Secondary | ICD-10-CM | POA: Diagnosis not present

## 2017-09-20 DIAGNOSIS — E875 Hyperkalemia: Secondary | ICD-10-CM | POA: Diagnosis not present

## 2017-09-20 DIAGNOSIS — E1129 Type 2 diabetes mellitus with other diabetic kidney complication: Secondary | ICD-10-CM | POA: Diagnosis not present

## 2017-09-20 DIAGNOSIS — R600 Localized edema: Secondary | ICD-10-CM | POA: Diagnosis not present

## 2017-09-20 DIAGNOSIS — N184 Chronic kidney disease, stage 4 (severe): Secondary | ICD-10-CM | POA: Diagnosis not present

## 2017-09-20 DIAGNOSIS — I10 Essential (primary) hypertension: Secondary | ICD-10-CM | POA: Diagnosis not present

## 2017-09-29 ENCOUNTER — Other Ambulatory Visit: Payer: Self-pay

## 2017-09-29 ENCOUNTER — Inpatient Hospital Stay
Admission: EM | Admit: 2017-09-29 | Discharge: 2017-10-14 | DRG: 246 | Disposition: A | Discharge status: Skilled Nursing Facility | Payer: Medicare Other | Attending: Internal Medicine | Admitting: Internal Medicine

## 2017-09-29 ENCOUNTER — Emergency Department: Payer: Medicare Other

## 2017-09-29 DIAGNOSIS — J9811 Atelectasis: Secondary | ICD-10-CM | POA: Diagnosis present

## 2017-09-29 DIAGNOSIS — E875 Hyperkalemia: Secondary | ICD-10-CM | POA: Diagnosis not present

## 2017-09-29 DIAGNOSIS — Z85828 Personal history of other malignant neoplasm of skin: Secondary | ICD-10-CM

## 2017-09-29 DIAGNOSIS — R059 Cough, unspecified: Secondary | ICD-10-CM

## 2017-09-29 DIAGNOSIS — Z833 Family history of diabetes mellitus: Secondary | ICD-10-CM

## 2017-09-29 DIAGNOSIS — K529 Noninfective gastroenteritis and colitis, unspecified: Secondary | ICD-10-CM | POA: Diagnosis present

## 2017-09-29 DIAGNOSIS — J9600 Acute respiratory failure, unspecified whether with hypoxia or hypercapnia: Secondary | ICD-10-CM

## 2017-09-29 DIAGNOSIS — I509 Heart failure, unspecified: Secondary | ICD-10-CM

## 2017-09-29 DIAGNOSIS — R7989 Other specified abnormal findings of blood chemistry: Secondary | ICD-10-CM

## 2017-09-29 DIAGNOSIS — R197 Diarrhea, unspecified: Secondary | ICD-10-CM

## 2017-09-29 DIAGNOSIS — H409 Unspecified glaucoma: Secondary | ICD-10-CM | POA: Diagnosis present

## 2017-09-29 DIAGNOSIS — Z515 Encounter for palliative care: Secondary | ICD-10-CM

## 2017-09-29 DIAGNOSIS — G309 Alzheimer's disease, unspecified: Secondary | ICD-10-CM | POA: Diagnosis present

## 2017-09-29 DIAGNOSIS — N170 Acute kidney failure with tubular necrosis: Secondary | ICD-10-CM | POA: Diagnosis present

## 2017-09-29 DIAGNOSIS — R778 Other specified abnormalities of plasma proteins: Secondary | ICD-10-CM

## 2017-09-29 DIAGNOSIS — N186 End stage renal disease: Secondary | ICD-10-CM | POA: Diagnosis not present

## 2017-09-29 DIAGNOSIS — Z87891 Personal history of nicotine dependence: Secondary | ICD-10-CM

## 2017-09-29 DIAGNOSIS — I959 Hypotension, unspecified: Secondary | ICD-10-CM | POA: Diagnosis not present

## 2017-09-29 DIAGNOSIS — E785 Hyperlipidemia, unspecified: Secondary | ICD-10-CM | POA: Diagnosis present

## 2017-09-29 DIAGNOSIS — J9601 Acute respiratory failure with hypoxia: Secondary | ICD-10-CM | POA: Diagnosis present

## 2017-09-29 DIAGNOSIS — Z7984 Long term (current) use of oral hypoglycemic drugs: Secondary | ICD-10-CM

## 2017-09-29 DIAGNOSIS — Z79899 Other long term (current) drug therapy: Secondary | ICD-10-CM

## 2017-09-29 DIAGNOSIS — I132 Hypertensive heart and chronic kidney disease with heart failure and with stage 5 chronic kidney disease, or end stage renal disease: Secondary | ICD-10-CM | POA: Diagnosis present

## 2017-09-29 DIAGNOSIS — H9190 Unspecified hearing loss, unspecified ear: Secondary | ICD-10-CM | POA: Diagnosis present

## 2017-09-29 DIAGNOSIS — R0603 Acute respiratory distress: Secondary | ICD-10-CM | POA: Diagnosis not present

## 2017-09-29 DIAGNOSIS — R112 Nausea with vomiting, unspecified: Secondary | ICD-10-CM

## 2017-09-29 DIAGNOSIS — Z91030 Bee allergy status: Secondary | ICD-10-CM

## 2017-09-29 DIAGNOSIS — E871 Hypo-osmolality and hyponatremia: Secondary | ICD-10-CM | POA: Diagnosis present

## 2017-09-29 DIAGNOSIS — I255 Ischemic cardiomyopathy: Secondary | ICD-10-CM | POA: Diagnosis present

## 2017-09-29 DIAGNOSIS — I214 Non-ST elevation (NSTEMI) myocardial infarction: Secondary | ICD-10-CM | POA: Diagnosis not present

## 2017-09-29 DIAGNOSIS — Z66 Do not resuscitate: Secondary | ICD-10-CM | POA: Diagnosis present

## 2017-09-29 DIAGNOSIS — Z9842 Cataract extraction status, left eye: Secondary | ICD-10-CM

## 2017-09-29 DIAGNOSIS — Z91041 Radiographic dye allergy status: Secondary | ICD-10-CM

## 2017-09-29 DIAGNOSIS — K449 Diaphragmatic hernia without obstruction or gangrene: Secondary | ICD-10-CM | POA: Diagnosis not present

## 2017-09-29 DIAGNOSIS — E7800 Pure hypercholesterolemia, unspecified: Secondary | ICD-10-CM | POA: Diagnosis present

## 2017-09-29 DIAGNOSIS — J810 Acute pulmonary edema: Secondary | ICD-10-CM

## 2017-09-29 DIAGNOSIS — Z955 Presence of coronary angioplasty implant and graft: Secondary | ICD-10-CM

## 2017-09-29 DIAGNOSIS — I5023 Acute on chronic systolic (congestive) heart failure: Secondary | ICD-10-CM | POA: Diagnosis not present

## 2017-09-29 DIAGNOSIS — D631 Anemia in chronic kidney disease: Secondary | ICD-10-CM | POA: Diagnosis present

## 2017-09-29 DIAGNOSIS — Z961 Presence of intraocular lens: Secondary | ICD-10-CM | POA: Diagnosis present

## 2017-09-29 DIAGNOSIS — I272 Pulmonary hypertension, unspecified: Secondary | ICD-10-CM | POA: Diagnosis present

## 2017-09-29 DIAGNOSIS — Z9841 Cataract extraction status, right eye: Secondary | ICD-10-CM

## 2017-09-29 DIAGNOSIS — E873 Alkalosis: Secondary | ICD-10-CM | POA: Diagnosis not present

## 2017-09-29 DIAGNOSIS — Z7189 Other specified counseling: Secondary | ICD-10-CM

## 2017-09-29 DIAGNOSIS — I252 Old myocardial infarction: Secondary | ICD-10-CM

## 2017-09-29 DIAGNOSIS — F0280 Dementia in other diseases classified elsewhere without behavioral disturbance: Secondary | ICD-10-CM | POA: Diagnosis present

## 2017-09-29 DIAGNOSIS — E1122 Type 2 diabetes mellitus with diabetic chronic kidney disease: Secondary | ICD-10-CM | POA: Diagnosis present

## 2017-09-29 DIAGNOSIS — Z8673 Personal history of transient ischemic attack (TIA), and cerebral infarction without residual deficits: Secondary | ICD-10-CM

## 2017-09-29 DIAGNOSIS — Z96651 Presence of right artificial knee joint: Secondary | ICD-10-CM | POA: Diagnosis present

## 2017-09-29 DIAGNOSIS — Z951 Presence of aortocoronary bypass graft: Secondary | ICD-10-CM

## 2017-09-29 DIAGNOSIS — J189 Pneumonia, unspecified organism: Secondary | ICD-10-CM

## 2017-09-29 DIAGNOSIS — R05 Cough: Secondary | ICD-10-CM

## 2017-09-29 DIAGNOSIS — Z7982 Long term (current) use of aspirin: Secondary | ICD-10-CM

## 2017-09-29 DIAGNOSIS — Z823 Family history of stroke: Secondary | ICD-10-CM

## 2017-09-29 DIAGNOSIS — I2510 Atherosclerotic heart disease of native coronary artery without angina pectoris: Secondary | ICD-10-CM | POA: Diagnosis present

## 2017-09-29 DIAGNOSIS — R609 Edema, unspecified: Secondary | ICD-10-CM | POA: Diagnosis present

## 2017-09-29 DIAGNOSIS — K219 Gastro-esophageal reflux disease without esophagitis: Secondary | ICD-10-CM | POA: Diagnosis present

## 2017-09-29 LAB — COMPREHENSIVE METABOLIC PANEL
ALK PHOS: 83 U/L (ref 38–126)
ALT: 26 U/L (ref 0–44)
AST: 34 U/L (ref 15–41)
Albumin: 3.2 g/dL — ABNORMAL LOW (ref 3.5–5.0)
Anion gap: 8 (ref 5–15)
BUN: 43 mg/dL — AB (ref 8–23)
CALCIUM: 8.2 mg/dL — AB (ref 8.9–10.3)
CHLORIDE: 98 mmol/L (ref 98–111)
CO2: 20 mmol/L — AB (ref 22–32)
CREATININE: 2.21 mg/dL — AB (ref 0.61–1.24)
GFR, EST AFRICAN AMERICAN: 30 mL/min — AB (ref >60)
GFR, EST NON AFRICAN AMERICAN: 25 mL/min — AB (ref >60)
Glucose, Bld: 241 mg/dL — ABNORMAL HIGH (ref 70–99)
Potassium: 4.8 mmol/L (ref 3.5–5.1)
SODIUM: 126 mmol/L — AB (ref 135–145)
Total Bilirubin: 0.6 mg/dL (ref 0.3–1.2)
Total Protein: 7.9 g/dL (ref 6.5–8.1)

## 2017-09-29 LAB — CBC
HCT: 27.8 % — ABNORMAL LOW (ref 40.0–52.0)
Hemoglobin: 9.7 g/dL — ABNORMAL LOW (ref 13.0–18.0)
MCH: 32 pg (ref 26.0–34.0)
MCHC: 35.1 g/dL (ref 32.0–36.0)
MCV: 91.2 fL (ref 80.0–100.0)
PLATELETS: 281 10*3/uL (ref 150–440)
RBC: 3.05 MIL/uL — AB (ref 4.40–5.90)
RDW: 14 % (ref 11.5–14.5)
WBC: 13 10*3/uL — ABNORMAL HIGH (ref 3.8–10.6)

## 2017-09-29 LAB — LIPASE, BLOOD: Lipase: 45 U/L (ref 11–51)

## 2017-09-29 MED ORDER — ONDANSETRON HCL 4 MG/2ML IJ SOLN
4.0000 mg | INTRAMUSCULAR | Status: AC
  Administered 2017-09-29: 4 mg via INTRAVENOUS
  Filled 2017-09-29: qty 2

## 2017-09-29 MED ORDER — ONDANSETRON HCL 4 MG/2ML IJ SOLN
4.0000 mg | Freq: Once | INTRAMUSCULAR | Status: AC
  Administered 2017-09-29: 4 mg via INTRAVENOUS
  Filled 2017-09-29: qty 2

## 2017-09-29 MED ORDER — FENTANYL CITRATE (PF) 100 MCG/2ML IJ SOLN
50.0000 ug | Freq: Once | INTRAMUSCULAR | Status: AC
  Administered 2017-09-29: 50 ug via INTRAVENOUS
  Filled 2017-09-29: qty 2

## 2017-09-29 MED ORDER — SODIUM CHLORIDE 0.9 % IV BOLUS
1000.0000 mL | Freq: Once | INTRAVENOUS | Status: AC
  Administered 2017-09-30: 1000 mL via INTRAVENOUS

## 2017-09-29 MED ORDER — FAMOTIDINE IN NACL 20-0.9 MG/50ML-% IV SOLN
20.0000 mg | Freq: Once | INTRAVENOUS | Status: AC
  Administered 2017-09-30: 20 mg via INTRAVENOUS
  Filled 2017-09-29 (×2): qty 50

## 2017-09-29 NOTE — ED Triage Notes (Signed)
Reports onset of symptoms 3 days ago.  Reports nausea, vomiting and diarrhea.

## 2017-09-29 NOTE — ED Notes (Signed)
Patient transported to CT 

## 2017-09-29 NOTE — ED Notes (Signed)
Pt assisted to wheelchair from car; c/o diarrhea for 2 days;

## 2017-09-29 NOTE — ED Provider Notes (Signed)
[facility] Emergency Department Provider Note   ____________________________________________   First MD Initiated Contact with Patient 09/29/17 2323     (approximate)  I have reviewed the triage vital signs and the nursing notes.   HISTORY  Chief Complaint Emesis and Diarrhea    HPI Ray Lamb is a 84 y.o. male who presents to the ED from home with a chief complaint of nausea, vomiting, diarrhea and abdominal discomfort.  Patient started lactulose for hyperkalemia 3 days ago.  He stopped the medicine yesterday because it was causing him to have profuse diarrhea.  Began to experience generalized abdominal pain associated with nausea and vomiting today.  Denies associated fever, chills, shortness of breath, dysuria.  Now having substernal chest pain.  Denies recent travel or trauma.   Past Medical History:  Diagnosis Date  . Anemia   . Chronic kidney disease    STAGE 4  . Coronary atherosclerosis   . Diabetes mellitus without complication ([place])   . Edema    ankle  . GERD (gastroesophageal reflux disease)   . Glaucoma   . Hearing loss   . Heart murmur   . History of hiatal hernia   . Hyperlipidemia   . Hypertension   . Mini stroke ([place])   . Mitral insufficiency   . Myocardial infarction ([place])    mild, heart cath done no stents needed  . Stroke [facility]) 2011   TIA    Patient Active Problem List   Diagnosis Date Noted  . Acute hypoxemic respiratory failure ([place]) 09/30/2017  . CAD (coronary artery disease) 09/04/2017  . Chronic kidney disease 09/04/2017  . Diabetes mellitus type 2, uncomplicated ([place]) 14/43/1540  . Pulmonary HTN ([place]) 09/04/2017  . Hiatal hernia 09/04/2017  . Mixed hyperlipidemia 09/04/2017  . Essential hypertension 09/04/2017  . Mitral insufficiency 06/05/2017  . Hypertension 06/05/2017  . Alzheimer's dementia 12/11/2016  . Advance care planning 05/03/2016  . Gastritis without bleeding   . Duodenitis   .  Hyperlipidemia 12/03/2014  . Senile purpura ([place]) 12/03/2014  . Hypertensive heart and renal disease 09/03/2014  . 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 disease ([place]) 09/03/2014    Past Surgical History:  Procedure Laterality Date  . CATARACT EXTRACTION W/PHACO Left 09/05/2016   Procedure: CATARACT EXTRACTION PHACO AND INTRAOCULAR LENS PLACEMENT (IOC);  Surgeon: Birder Robson, MD;  Location: [facility] ORS;  Service: Ophthalmology;  Laterality: Left;  [place] 00:43.1 AP% 22.6 CDE 9.77 Fluid Pack lot #[id number]  . CATARACT EXTRACTION W/PHACO Right 09/26/2016   Procedure: CATARACT EXTRACTION PHACO AND INTRAOCULAR LENS PLACEMENT ([place]);  Surgeon: Birder Robson, MD;  Location: [facility] ORS;  Service: Ophthalmology;  Laterality: Right;  [place] 01:00 AP% 16.0 CDE 9.71 Fluid pack lot # [id number]  . CORONARY ARTERY BYPASS GRAFT  2003  . ESOPHAGOGASTRODUODENOSCOPY (EGD) WITH PROPOFOL N/A 04/25/2016   Procedure: ESOPHAGOGASTRODUODENOSCOPY (EGD) WITH PROPOFOL;  Surgeon: Lucilla Lame, MD;  Location: [facility] ENDOSCOPY;  Service: Endoscopy;  Laterality: N/A;  . JOINT REPLACEMENT Right 2010   knee  . SKIN CANCER EXCISION      Prior to Admission medications   Medication Sig Start Date End Date Taking? Authorizing Provider  atorvastatin (LIPITOR) 80 MG tablet Take 1 tablet (80 mg total) by mouth at bedtime. 02/26/17  Yes Crissman, Jeannette How, MD  carvedilol (COREG) 25 MG tablet Take 25 mg by mouth 2 (two) times daily.  06/28/17 06/28/18 Yes [provider]  donepezil (ARICEPT) 5 MG tablet Take 5 mg by mouth at  bedtime.  03/22/16 09/30/17 Yes [provider]  ezetimibe (ZETIA) 10 MG tablet Take 1 tablet (10 mg total) by mouth daily. 02/26/17  Yes Crissman, Jeannette How, MD  furosemide (LASIX) 40 MG tablet Take 40 mg by mouth daily as needed.  11/17/15  Yes [provider]  glipiZIDE (GLUCOTROL) 5 MG tablet Take 1 tablet (5 mg total) by mouth daily. 02/26/17  Yes [name], MD  isosorbide  mononitrate (IMDUR) 30 MG 24 hr tablet  05/17/15  Yes [provider]  JANUVIA 25 MG tablet Take 1 tablet (25 mg total) by mouth daily. 02/26/17  Yes Crissman, Mark A, MD  mometasone (ELOCON) 0.1 % lotion APPLY 4 DROPS INTO EACH EAR DAILY FOR 10 DAYS THEN AS NEEDED FOR DRY SKIN AND ITCHING 04/23/15  Yes [provider]  Multiple Vitamin (MULTIVITAMIN) tablet Take 1 tablet by mouth daily.   Yes [provider]  olmesartan (BENICAR) 20 MG tablet Take 20 mg by mouth daily.    Yes [provider]  timolol (TIMOPTIC) 0.5 % ophthalmic solution Place 1 drop into both eyes daily.    Yes [provider]  TURMERIC PO Take 1,300 mg by mouth.   Yes [provider]  amoxicillin (AMOXIL) 500 MG capsule TAKE 4 CAPSULES BY MOUTH ONE HOUR PRIOR TO APPT 04/02/17   [provider]  aspirin EC 81 MG tablet Take 81 mg by mouth daily.    [provider]  digoxin (LANOXIN) 0.125 MG tablet Take 62.5 mcg by mouth every other day. 04/18/17   [provider]  metoprolol succinate (TOPROL-XL) 25 MG 24 hr tablet Take 1 tablet (25 mg total) by mouth daily. Patient not taking: Reported on 09/30/2017 02/26/17   [name], MD    Allergies Bee venom; Iodinated diagnostic agents; and Metrizamide  Family History  Problem Relation Age of Onset  . Stroke Mother   . Diabetes Mother   . Diabetes Sister   . Diabetes Maternal Grandmother     Social History Social History   Tobacco Use  . Smoking status: Former Smoker    Last attempt to quit: 09/02/1964    Years since quitting: 53.1  . Smokeless tobacco: Never Used  Substance Use Topics  . Alcohol use: No    Alcohol/week: 0.0 standard drinks  . Drug use: No    Review of Systems  Constitutional: No fever/chills Eyes: No visual changes. ENT: No sore throat. Cardiovascular: Denies chest pain. Respiratory: Denies shortness of breath. Gastrointestinal: No abdominal pain.  No nausea, no  vomiting.  No diarrhea.  No constipation. Genitourinary: Negative for dysuria. Musculoskeletal: Negative for back pain. Skin: Negative for rash. Neurological: Negative for headaches, focal weakness or numbness.   ____________________________________________   PHYSICAL EXAM:  VITAL SIGNS: ED Triage Vitals  Enc Vitals Group     BP 09/29/17 2151 140/63     Pulse Rate 09/29/17 2151 79     Resp 09/29/17 2151 18     Temp 09/29/17 2151 (!) 97.5 F (36.4 C)     Temp Source 09/29/17 2151 Oral     SpO2 09/29/17 2151 96 %     Weight 09/29/17 2152 160 lb (72.6 kg)     Height 09/29/17 2152 5\' 7"  (1.702 m)     Head Circumference --      Peak Flow --      Pain Score 09/29/17 2152 8     Pain Loc --      Pain Edu? --  Excl. in [place]? --     Constitutional: Alert and oriented.  Uncomfortable appearing and in mild acute distress. Eyes: Conjunctivae are normal. PERRL. EOMI. Head: Atraumatic. Nose: No congestion/rhinnorhea. Mouth/Throat: Mucous membranes are moist.  Oropharynx non-erythematous. Neck: No stridor.   Cardiovascular: Normal rate, regular rhythm. Grossly normal heart sounds.  Good peripheral circulation. Respiratory: Normal respiratory effort.  No retractions. Lungs CTAB. Gastrointestinal: Soft and mildly diffusely tender to palpation without rebound or guarding. No distention. No abdominal bruits. No CVA tenderness. Musculoskeletal: No lower extremity tenderness nor edema.  No joint effusions. Neurologic:  Normal speech and language. No gross focal neurologic deficits are appreciated.  Skin:  Skin is warm, diaphoretic and intact. No rash noted. Psychiatric: Mood and affect are normal. Speech and behavior are normal.  ____________________________________________   LABS (all labs ordered are listed, but only abnormal results are displayed)  Labs Reviewed  COMPREHENSIVE METABOLIC PANEL - Abnormal; Notable for the following components:      Result Value   Sodium 126 (*)     CO2 20 (*)    Glucose, Bld 241 (*)    BUN 43 (*)    Creatinine, Ser 2.21 (*)    Calcium 8.2 (*)    Albumin 3.2 (*)    GFR calc non Af Amer 25 (*)    GFR calc Af Amer 30 (*)    All other components within normal limits  CBC - Abnormal; Notable for the following components:   WBC 13.0 (*)    RBC 3.05 (*)    Hemoglobin 9.7 (*)    HCT 27.8 (*)    All other components within normal limits  TROPONIN I - Abnormal; Notable for the following components:   Troponin I 0.06 (*)    All other components within normal limits  BLOOD GAS, ARTERIAL - Abnormal; Notable for the following components:   pH, Arterial 7.20 (*)    pO2, Arterial 75 (*)    Bicarbonate 14.5 (*)    Acid-base deficit 12.6 (*)    All other components within normal limits  LACTIC ACID, PLASMA - Abnormal; Notable for the following components:   Lactic Acid, Venous 2.3 (*)    All other components within normal limits  MAGNESIUM - Abnormal; Notable for the following components:   Magnesium 1.5 (*)    All other components within normal limits  DIGOXIN LEVEL - Abnormal; Notable for the following components:   Digoxin Level <0.2 (*)    All other components within normal limits  TROPONIN I - Abnormal; Notable for the following components:   Troponin I 0.26 (*)    All other components within normal limits  CULTURE, BLOOD (ROUTINE X 2)  CULTURE, BLOOD (ROUTINE X 2)  MRSA PCR SCREENING  LIPASE, BLOOD  PHOSPHORUS  PROCALCITONIN  URINALYSIS, COMPLETE (UACMP) WITH MICROSCOPIC  LACTIC ACID, PLASMA  CALCIUM, IONIZED  PREALBUMIN  TROPONIN I  TROPONIN I  TROPONIN I   ____________________________________________  EKG  ED ECG REPORT I, Jeanmarie Mccowen J, the attending physician, personally viewed and interpreted this ECG.   Date: 09/29/2017  EKG Time: 2154  Rate: 77  Rhythm: normal EKG, normal sinus rhythm  Axis: LAD  Intervals:none  ST&T Change: Nonspecific  ____________________________________________  RADIOLOGY  ED MD  interpretation: Increased retroperitoneal lymph nodes concerning for lymphoma versus metastatic disease; mild colitis at rectum Chest x-ray consistent with edema  Official radiology report(s): Ct Abdomen Pelvis Wo Contrast  Result Date: 09/30/2017 CLINICAL DATA:  Patient with nausea, vomiting and diarrhea. End-stage  renal disease. EXAM: CT ABDOMEN AND PELVIS WITHOUT CONTRAST TECHNIQUE: Multidetector CT imaging of the abdomen and pelvis was performed following the standard protocol without IV contrast. COMPARISON:  CT abdomen pelvis 03/16/2016. FINDINGS: Lower chest: Normal heart size. Interval development of bilateral ground-glass opacities and interlobular septal thickening. Small bilateral pleural effusions. Hepatobiliary: Liver is normal in size and contour. Multiple stones within the gallbladder lumen. No gallbladder wall thickening. No intrahepatic or extrahepatic biliary ductal dilatation. Pancreas: Unremarkable Spleen: Unremarkable Adrenals/Urinary Tract: Adrenal glands are normal. Bilateral perinephric fat stranding. Kidneys are symmetric in size. No hydronephrosis. Urinary bladder is unremarkable. Stomach/Bowel: Mild wall thickening of the rectum. Re demonstrated mild wall thickening of the intrathoracic stomach. No evidence for bowel obstruction. No free intraperitoneal air. Large hiatal hernia. Vascular/Lymphatic: Normal caliber abdominal aorta. Peripheral calcified atherosclerotic plaque. Interval increase in size of retroperitoneal adenopathy with a reference 1.6 cm aortocaval lymph node (image 24; series 2), previously measuring 1.2 cm. Left periaortic lymph node measures 1.8 cm (image 26; series 2), previously 1.3 cm. Left periaortic lymph node measures 1.8 cm (image 36; series 2), previously 0.9 cm. Reference paraceliac node measures 1.4 cm (image 20; series 2), previously 0.9 cm. Reproductive: Prostate is enlarged. Other: Small bilateral fat containing inguinal hernias,  left-greater-than-right. Musculoskeletal: Lumbar spine degenerative changes. No aggressive or acute appearing osseous lesions. IMPRESSION: 1. Interval increase in size of bulky retroperitoneal and upper abdominal adenopathy concerning for the possibility of lymphoma or metastatic disease. 2. Re demonstrated nonspecific mild wall thickening of the intrathoracic stomach. Large hiatal hernia. 3. Interval development of bilateral lower lobe ground-glass opacities concerning for the possibility of edema. Small bilateral pleural effusions. 4. Mild wall thickening of the rectum.  Colitis not excluded. Electronically Signed   By: Lovey Newcomer M.D.   On: 09/30/2017 00:21   Dg Chest Port 1 View  Result Date: 09/30/2017 CLINICAL DATA:  Stage IV renal disease EXAM: PORTABLE CHEST 1 VIEW COMPARISON:  Chest radiograph 04/10/2009 FINDINGS: Monitoring leads overlie the patient. Stable cardiac and mediastinal contours. Large hiatal hernia. Diffuse bilateral interstitial pulmonary opacities. Small bilateral pleural effusions. IMPRESSION: Findings compatible with edema and small bilateral pleural effusions. Large hiatal hernia. Electronically Signed   By: Lovey Newcomer M.D.   On: 09/30/2017 00:46    ____________________________________________   PROCEDURES  Procedure(s) performed: None  Procedures  Critical Care performed: Yes, see critical care note(s)   CRITICAL CARE Performed by: Paulette Blanch   Total critical care time: 45 minutes  Critical care time was exclusive of separately billable procedures and treating other patients.  Critical care was necessary to treat or prevent imminent or life-threatening deterioration.  Critical care was time spent personally by me on the following activities: development of treatment plan with patient and/or surrogate as well as nursing, discussions with consultants, evaluation of patient's response to treatment, examination of patient, obtaining history from patient or  surrogate, ordering and performing treatments and interventions, ordering and review of laboratory studies, ordering and review of radiographic studies, pulse oximetry and re-evaluation of patient's condition.  ____________________________________________   INITIAL IMPRESSION / ASSESSMENT AND PLAN / ED COURSE  As part of my medical decision making, I reviewed the following data within the [place] History obtained from family, Nursing notes reviewed and incorporated, Labs reviewed, EKG interpreted, Old chart reviewed, Radiograph reviewed, Discussed with admitting physician and Notes from prior ED visits   84 year old male who presents with diarrhea after starting lactulose; now with nausea/vomiting/abdominal pain. Differential diagnosis includes, but  is not limited to, biliary disease (biliary colic, acute cholecystitis, cholangitis, choledocholithiasis, etc), intrathoracic causes for epigastric abdominal pain including ACS, gastritis, duodenitis, pancreatitis, small bowel or large bowel obstruction, abdominal aortic aneurysm, hernia, ulcer(s), colitis, appendicitis, diverticulitis, etc.  Laboratory results remarkable for mild leukocytosis, stable anemia, stable renal insufficiency, new hyponatremia.  Will initiate IV fluid resuscitation, 50 mcg IV fentanyl for pain paired with 4 mg IV Zofran for nausea, 20 mg IV Pepcid as patient has hiatal hernia and has been experiencing "indigestion".  Will proceed with noncontrast CT abdomen/pelvis to evaluate for intra-abdominal etiology of patient's symptoms.  Clinical Course as of Sep 30 340  Sun Sep 30, 2017  0009 Noted elevated troponin. Will administer rectal aspirin and ntg paste.    [JS]  0027 Patient now short of breath.  Bibasilar rales auscultated.  Will obtain urgent portable chest x-ray.  On 2 L nasal cannula oxygen patient's saturations are 93%.  Will administer 40 mg IV Lasix for diuresis.   [JS]  0030 Audible rales heard.   Will trial BiPAP.   [JS]  0045 I personally reviewed patient's chest x-ray which is consistent with overt edema.  Discussed with hospitalist who will evaluate patient in the emergency department for admission.   [JS]    Clinical Course User Index [JS] Paulette Blanch, MD     ____________________________________________   FINAL CLINICAL IMPRESSION(S) / ED DIAGNOSES  Final diagnoses:  Nausea vomiting and diarrhea  Hyponatremia  Non-STEMI (non-ST elevated myocardial infarction) ([place])  Elevated troponin  Acute respiratory distress  Colitis  Acute pulmonary edema [facility])     ED Discharge Orders    None       Note:  This document was prepared using Dragon voice recognition software and may include unintentional dictation errors.    Paulette Blanch, MD 09/30/17 [id number]

## 2017-09-29 NOTE — ED Notes (Signed)
Pt had stage 4 kidney disease and his MD started him on a medication to decrease his K+ and it is giving him horrible diarrhea.

## 2017-09-30 ENCOUNTER — Inpatient Hospital Stay: Payer: Medicare Other

## 2017-09-30 ENCOUNTER — Emergency Department: Payer: Medicare Other

## 2017-09-30 ENCOUNTER — Encounter: Payer: Self-pay | Admitting: Internal Medicine

## 2017-09-30 ENCOUNTER — Inpatient Hospital Stay
Admit: 2017-09-30 | Discharge: 2017-09-30 | Disposition: A | Discharge status: Home / Self Care | Payer: Medicare Other | Attending: Cardiology | Admitting: Cardiology

## 2017-09-30 DIAGNOSIS — J189 Pneumonia, unspecified organism: Secondary | ICD-10-CM | POA: Diagnosis not present

## 2017-09-30 DIAGNOSIS — R079 Chest pain, unspecified: Secondary | ICD-10-CM | POA: Diagnosis not present

## 2017-09-30 DIAGNOSIS — R809 Proteinuria, unspecified: Secondary | ICD-10-CM | POA: Diagnosis not present

## 2017-09-30 DIAGNOSIS — I129 Hypertensive chronic kidney disease with stage 1 through stage 4 chronic kidney disease, or unspecified chronic kidney disease: Secondary | ICD-10-CM | POA: Diagnosis not present

## 2017-09-30 DIAGNOSIS — R112 Nausea with vomiting, unspecified: Secondary | ICD-10-CM | POA: Diagnosis not present

## 2017-09-30 DIAGNOSIS — E873 Alkalosis: Secondary | ICD-10-CM | POA: Diagnosis not present

## 2017-09-30 DIAGNOSIS — Z66 Do not resuscitate: Secondary | ICD-10-CM | POA: Diagnosis present

## 2017-09-30 DIAGNOSIS — I272 Pulmonary hypertension, unspecified: Secondary | ICD-10-CM | POA: Diagnosis not present

## 2017-09-30 DIAGNOSIS — E1165 Type 2 diabetes mellitus with hyperglycemia: Secondary | ICD-10-CM | POA: Diagnosis not present

## 2017-09-30 DIAGNOSIS — J9601 Acute respiratory failure with hypoxia: Secondary | ICD-10-CM | POA: Diagnosis present

## 2017-09-30 DIAGNOSIS — G309 Alzheimer's disease, unspecified: Secondary | ICD-10-CM | POA: Diagnosis present

## 2017-09-30 DIAGNOSIS — F0280 Dementia in other diseases classified elsewhere without behavioral disturbance: Secondary | ICD-10-CM | POA: Diagnosis not present

## 2017-09-30 DIAGNOSIS — I132 Hypertensive heart and chronic kidney disease with heart failure and with stage 5 chronic kidney disease, or end stage renal disease: Secondary | ICD-10-CM | POA: Diagnosis present

## 2017-09-30 DIAGNOSIS — Z7189 Other specified counseling: Secondary | ICD-10-CM | POA: Diagnosis not present

## 2017-09-30 DIAGNOSIS — Z7984 Long term (current) use of oral hypoglycemic drugs: Secondary | ICD-10-CM | POA: Diagnosis not present

## 2017-09-30 DIAGNOSIS — Z452 Encounter for adjustment and management of vascular access device: Secondary | ICD-10-CM | POA: Diagnosis not present

## 2017-09-30 DIAGNOSIS — K529 Noninfective gastroenteritis and colitis, unspecified: Secondary | ICD-10-CM | POA: Diagnosis present

## 2017-09-30 DIAGNOSIS — N170 Acute kidney failure with tubular necrosis: Secondary | ICD-10-CM | POA: Diagnosis present

## 2017-09-30 DIAGNOSIS — I2510 Atherosclerotic heart disease of native coronary artery without angina pectoris: Secondary | ICD-10-CM | POA: Diagnosis present

## 2017-09-30 DIAGNOSIS — Z01810 Encounter for preprocedural cardiovascular examination: Secondary | ICD-10-CM | POA: Diagnosis not present

## 2017-09-30 DIAGNOSIS — E875 Hyperkalemia: Secondary | ICD-10-CM | POA: Diagnosis not present

## 2017-09-30 DIAGNOSIS — I5023 Acute on chronic systolic (congestive) heart failure: Secondary | ICD-10-CM | POA: Diagnosis present

## 2017-09-30 DIAGNOSIS — N179 Acute kidney failure, unspecified: Secondary | ICD-10-CM | POA: Diagnosis not present

## 2017-09-30 DIAGNOSIS — Z515 Encounter for palliative care: Secondary | ICD-10-CM | POA: Diagnosis not present

## 2017-09-30 DIAGNOSIS — Z8673 Personal history of transient ischemic attack (TIA), and cerebral infarction without residual deficits: Secondary | ICD-10-CM | POA: Diagnosis not present

## 2017-09-30 DIAGNOSIS — K219 Gastro-esophageal reflux disease without esophagitis: Secondary | ICD-10-CM | POA: Diagnosis present

## 2017-09-30 DIAGNOSIS — D631 Anemia in chronic kidney disease: Secondary | ICD-10-CM | POA: Diagnosis not present

## 2017-09-30 DIAGNOSIS — I130 Hypertensive heart and chronic kidney disease with heart failure and stage 1 through stage 4 chronic kidney disease, or unspecified chronic kidney disease: Secondary | ICD-10-CM | POA: Diagnosis not present

## 2017-09-30 DIAGNOSIS — Z7982 Long term (current) use of aspirin: Secondary | ICD-10-CM | POA: Diagnosis not present

## 2017-09-30 DIAGNOSIS — Z87891 Personal history of nicotine dependence: Secondary | ICD-10-CM | POA: Diagnosis not present

## 2017-09-30 DIAGNOSIS — N184 Chronic kidney disease, stage 4 (severe): Secondary | ICD-10-CM | POA: Diagnosis not present

## 2017-09-30 DIAGNOSIS — R1310 Dysphagia, unspecified: Secondary | ICD-10-CM | POA: Diagnosis not present

## 2017-09-30 DIAGNOSIS — E871 Hypo-osmolality and hyponatremia: Secondary | ICD-10-CM | POA: Diagnosis present

## 2017-09-30 DIAGNOSIS — I509 Heart failure, unspecified: Secondary | ICD-10-CM | POA: Diagnosis not present

## 2017-09-30 DIAGNOSIS — I5022 Chronic systolic (congestive) heart failure: Secondary | ICD-10-CM | POA: Diagnosis not present

## 2017-09-30 DIAGNOSIS — H9190 Unspecified hearing loss, unspecified ear: Secondary | ICD-10-CM | POA: Diagnosis present

## 2017-09-30 DIAGNOSIS — Z9889 Other specified postprocedural states: Secondary | ICD-10-CM | POA: Diagnosis not present

## 2017-09-30 DIAGNOSIS — M6281 Muscle weakness (generalized): Secondary | ICD-10-CM | POA: Diagnosis not present

## 2017-09-30 DIAGNOSIS — I25118 Atherosclerotic heart disease of native coronary artery with other forms of angina pectoris: Secondary | ICD-10-CM | POA: Diagnosis not present

## 2017-09-30 DIAGNOSIS — K449 Diaphragmatic hernia without obstruction or gangrene: Secondary | ICD-10-CM | POA: Diagnosis not present

## 2017-09-30 DIAGNOSIS — I25790 Atherosclerosis of other coronary artery bypass graft(s) with unstable angina pectoris: Secondary | ICD-10-CM | POA: Diagnosis not present

## 2017-09-30 DIAGNOSIS — Z961 Presence of intraocular lens: Secondary | ICD-10-CM | POA: Diagnosis present

## 2017-09-30 DIAGNOSIS — Z96651 Presence of right artificial knee joint: Secondary | ICD-10-CM | POA: Diagnosis present

## 2017-09-30 DIAGNOSIS — E7800 Pure hypercholesterolemia, unspecified: Secondary | ICD-10-CM | POA: Diagnosis present

## 2017-09-30 DIAGNOSIS — H409 Unspecified glaucoma: Secondary | ICD-10-CM | POA: Diagnosis present

## 2017-09-30 DIAGNOSIS — R109 Unspecified abdominal pain: Secondary | ICD-10-CM | POA: Diagnosis not present

## 2017-09-30 DIAGNOSIS — R262 Difficulty in walking, not elsewhere classified: Secondary | ICD-10-CM | POA: Diagnosis not present

## 2017-09-30 DIAGNOSIS — N186 End stage renal disease: Secondary | ICD-10-CM | POA: Diagnosis not present

## 2017-09-30 DIAGNOSIS — R7989 Other specified abnormal findings of blood chemistry: Secondary | ICD-10-CM | POA: Diagnosis not present

## 2017-09-30 DIAGNOSIS — R05 Cough: Secondary | ICD-10-CM | POA: Diagnosis not present

## 2017-09-30 DIAGNOSIS — R0603 Acute respiratory distress: Secondary | ICD-10-CM | POA: Diagnosis present

## 2017-09-30 DIAGNOSIS — I959 Hypotension, unspecified: Secondary | ICD-10-CM | POA: Diagnosis not present

## 2017-09-30 DIAGNOSIS — G301 Alzheimer's disease with late onset: Secondary | ICD-10-CM | POA: Diagnosis not present

## 2017-09-30 DIAGNOSIS — K30 Functional dyspepsia: Secondary | ICD-10-CM | POA: Diagnosis not present

## 2017-09-30 DIAGNOSIS — Z951 Presence of aortocoronary bypass graft: Secondary | ICD-10-CM | POA: Diagnosis not present

## 2017-09-30 DIAGNOSIS — Z955 Presence of coronary angioplasty implant and graft: Secondary | ICD-10-CM | POA: Diagnosis not present

## 2017-09-30 DIAGNOSIS — Z7401 Bed confinement status: Secondary | ICD-10-CM | POA: Diagnosis not present

## 2017-09-30 DIAGNOSIS — E785 Hyperlipidemia, unspecified: Secondary | ICD-10-CM | POA: Diagnosis present

## 2017-09-30 DIAGNOSIS — E1122 Type 2 diabetes mellitus with diabetic chronic kidney disease: Secondary | ICD-10-CM | POA: Diagnosis present

## 2017-09-30 DIAGNOSIS — I255 Ischemic cardiomyopathy: Secondary | ICD-10-CM | POA: Diagnosis present

## 2017-09-30 DIAGNOSIS — R0609 Other forms of dyspnea: Secondary | ICD-10-CM | POA: Diagnosis not present

## 2017-09-30 DIAGNOSIS — J9690 Respiratory failure, unspecified, unspecified whether with hypoxia or hypercapnia: Secondary | ICD-10-CM | POA: Diagnosis not present

## 2017-09-30 DIAGNOSIS — J9811 Atelectasis: Secondary | ICD-10-CM | POA: Diagnosis present

## 2017-09-30 DIAGNOSIS — I214 Non-ST elevation (NSTEMI) myocardial infarction: Secondary | ICD-10-CM | POA: Diagnosis not present

## 2017-09-30 LAB — APTT: APTT: 29 s (ref 24–36)

## 2017-09-30 LAB — BASIC METABOLIC PANEL
Anion gap: 9 (ref 5–15)
BUN: 46 mg/dL — ABNORMAL HIGH (ref 8–23)
CO2: 19 mmol/L — AB (ref 22–32)
Calcium: 7.7 mg/dL — ABNORMAL LOW (ref 8.9–10.3)
Chloride: 100 mmol/L (ref 98–111)
Creatinine, Ser: 2.28 mg/dL — ABNORMAL HIGH (ref 0.61–1.24)
GFR calc Af Amer: 28 mL/min — ABNORMAL LOW (ref >60)
GFR calc non Af Amer: 25 mL/min — ABNORMAL LOW (ref >60)
GLUCOSE: 289 mg/dL — AB (ref 70–99)
POTASSIUM: 5.2 mmol/L — AB (ref 3.5–5.1)
Sodium: 128 mmol/L — ABNORMAL LOW (ref 135–145)

## 2017-09-30 LAB — BLOOD GAS, ARTERIAL
Acid-base deficit: 12.6 mmol/L — ABNORMAL HIGH (ref 0.0–2.0)
BICARBONATE: 14.5 mmol/L — AB (ref 20.0–28.0)
Delivery systems: POSITIVE
Expiratory PAP: 5
FIO2: 0.5
Inspiratory PAP: 10
O2 SAT: 91 %
PCO2 ART: 37 mmHg (ref 32.0–48.0)
PO2 ART: 75 mmHg — AB (ref 83.0–108.0)
Patient temperature: 37
pH, Arterial: 7.2 — ABNORMAL LOW (ref 7.350–7.450)

## 2017-09-30 LAB — TROPONIN I
Troponin I: 0.06 ng/mL (ref <0.03)
Troponin I: 0.26 ng/mL (ref <0.03)
Troponin I: >65 ng/mL (ref <0.03)
Troponin I: >65 ng/mL (ref <0.03)
Troponin I: >65 ng/mL (ref <0.03)
Troponin I: >65 ng/mL (ref <0.03)

## 2017-09-30 LAB — CBC
HEMATOCRIT: 31.8 % — AB (ref 40.0–52.0)
Hemoglobin: 10.8 g/dL — ABNORMAL LOW (ref 13.0–18.0)
MCH: 31.4 pg (ref 26.0–34.0)
MCHC: 33.8 g/dL (ref 32.0–36.0)
MCV: 92.7 fL (ref 80.0–100.0)
Platelets: 280 10*3/uL (ref 150–440)
RBC: 3.43 MIL/uL — ABNORMAL LOW (ref 4.40–5.90)
RDW: 13.9 % (ref 11.5–14.5)
WBC: 13.6 10*3/uL — ABNORMAL HIGH (ref 3.8–10.6)

## 2017-09-30 LAB — MRSA PCR SCREENING: MRSA BY PCR: NEGATIVE

## 2017-09-30 LAB — PROTIME-INR
INR: 1.08
Prothrombin Time: 13.9 seconds (ref 11.4–15.2)

## 2017-09-30 LAB — PHOSPHORUS: Phosphorus: 2.5 mg/dL (ref 2.5–4.6)

## 2017-09-30 LAB — GLUCOSE, CAPILLARY
GLUCOSE-CAPILLARY: 216 mg/dL — AB (ref 70–99)
Glucose-Capillary: 298 mg/dL — ABNORMAL HIGH (ref 70–99)
Glucose-Capillary: 221 mg/dL — ABNORMAL HIGH (ref 70–99)
Glucose-Capillary: 192 mg/dL — ABNORMAL HIGH (ref 70–99)

## 2017-09-30 LAB — LACTIC ACID, PLASMA
LACTIC ACID, VENOUS: 1.9 mmol/L (ref 0.5–1.9)
LACTIC ACID, VENOUS: 2.3 mmol/L — AB (ref 0.5–1.9)

## 2017-09-30 LAB — DIGOXIN LEVEL: Digoxin Level: <0.2 ng/mL — ABNORMAL LOW (ref 0.8–2.0)

## 2017-09-30 LAB — HEPARIN LEVEL (UNFRACTIONATED): HEPARIN UNFRACTIONATED: 1.26 [IU]/mL — AB (ref 0.30–0.70)

## 2017-09-30 LAB — PREALBUMIN: Prealbumin: 18.4 mg/dL (ref 18–38)

## 2017-09-30 LAB — ECHOCARDIOGRAM COMPLETE
Height: 66 in
Weight: 2518.54 oz

## 2017-09-30 LAB — MAGNESIUM: Magnesium: 1.5 mg/dL — ABNORMAL LOW (ref 1.7–2.4)

## 2017-09-30 LAB — PROCALCITONIN

## 2017-09-30 MED ORDER — FENTANYL CITRATE (PF) 100 MCG/2ML IJ SOLN
50.0000 ug | Freq: Once | INTRAMUSCULAR | Status: AC
  Administered 2017-09-30: 50 ug via INTRAVENOUS
  Filled 2017-09-30: qty 2

## 2017-09-30 MED ORDER — PIPERACILLIN-TAZOBACTAM 3.375 G IVPB
3.3750 g | Freq: Two times a day (BID) | INTRAVENOUS | Status: DC
  Filled 2017-09-30 (×2): qty 50

## 2017-09-30 MED ORDER — FUROSEMIDE 40 MG PO TABS
40.0000 mg | ORAL_TABLET | Freq: Every day | ORAL | Status: DC
  Filled 2017-09-30 (×2): qty 1

## 2017-09-30 MED ORDER — HEPARIN (PORCINE) IN NACL 100-0.45 UNIT/ML-% IJ SOLN
850.0000 [IU]/h | INTRAMUSCULAR | Status: DC
  Administered 2017-09-30: 850 [IU]/h via INTRAVENOUS
  Filled 2017-09-30: qty 250

## 2017-09-30 MED ORDER — MAGNESIUM SULFATE 2 GM/50ML IV SOLN
2.0000 g | Freq: Once | INTRAVENOUS | Status: AC
  Administered 2017-09-30: 2 g via INTRAVENOUS
  Filled 2017-09-30: qty 50

## 2017-09-30 MED ORDER — NITROGLYCERIN 2 % TD OINT
1.0000 [in_us] | TOPICAL_OINTMENT | Freq: Once | TRANSDERMAL | Status: AC
  Administered 2017-09-30: 1 [in_us] via TOPICAL
  Filled 2017-09-30: qty 1

## 2017-09-30 MED ORDER — ADULT MULTIVITAMIN W/MINERALS CH
1.0000 | ORAL_TABLET | Freq: Every day | ORAL | Status: DC
  Administered 2017-09-30 – 2017-10-14 (×12): 1 via ORAL
  Filled 2017-09-30 (×13): qty 1

## 2017-09-30 MED ORDER — FAMOTIDINE IN NACL 20-0.9 MG/50ML-% IV SOLN
20.0000 mg | Freq: Two times a day (BID) | INTRAVENOUS | Status: DC
  Administered 2017-09-30 – 2017-10-01 (×2): 20 mg via INTRAVENOUS
  Filled 2017-09-30: qty 50

## 2017-09-30 MED ORDER — ISOSORBIDE MONONITRATE ER 30 MG PO TB24
30.0000 mg | ORAL_TABLET | Freq: Every day | ORAL | Status: DC
  Administered 2017-10-06 – 2017-10-14 (×8): 30 mg via ORAL
  Filled 2017-09-30 (×12): qty 1

## 2017-09-30 MED ORDER — ACETAMINOPHEN 325 MG PO TABS
650.0000 mg | ORAL_TABLET | Freq: Four times a day (QID) | ORAL | Status: DC | PRN
  Administered 2017-10-01: 650 mg via ORAL
  Filled 2017-09-30: qty 2

## 2017-09-30 MED ORDER — ASPIRIN 300 MG RE SUPP
300.0000 mg | Freq: Once | RECTAL | Status: AC
  Administered 2017-09-30: 300 mg via RECTAL
  Filled 2017-09-30: qty 1

## 2017-09-30 MED ORDER — DONEPEZIL HCL 5 MG PO TABS
5.0000 mg | ORAL_TABLET | Freq: Every day | ORAL | Status: DC
  Administered 2017-09-30 – 2017-10-13 (×13): 5 mg via ORAL
  Filled 2017-09-30 (×19): qty 1

## 2017-09-30 MED ORDER — ONDANSETRON HCL 4 MG/2ML IJ SOLN
4.0000 mg | Freq: Four times a day (QID) | INTRAMUSCULAR | Status: DC | PRN
  Administered 2017-09-30 – 2017-10-02 (×2): 4 mg via INTRAVENOUS
  Filled 2017-09-30 (×3): qty 2

## 2017-09-30 MED ORDER — HEPARIN SODIUM (PORCINE) 5000 UNIT/ML IJ SOLN
5000.0000 [IU] | Freq: Three times a day (TID) | INTRAMUSCULAR | Status: DC
  Administered 2017-09-30: 5000 [IU] via SUBCUTANEOUS
  Filled 2017-09-30: qty 1

## 2017-09-30 MED ORDER — ATORVASTATIN CALCIUM 20 MG PO TABS
80.0000 mg | ORAL_TABLET | Freq: Every day | ORAL | Status: DC
  Administered 2017-09-30 – 2017-10-13 (×13): 80 mg via ORAL
  Filled 2017-09-30 (×13): qty 4

## 2017-09-30 MED ORDER — DIGOXIN 125 MCG PO TABS: 62.5000 ug | ORAL_TABLET | ORAL | Status: DC

## 2017-09-30 MED ORDER — HEPARIN BOLUS VIA INFUSION
4000.0000 [IU] | Freq: Once | INTRAVENOUS | Status: AC
  Administered 2017-09-30: 4000 [IU] via INTRAVENOUS
  Filled 2017-09-30: qty 4000

## 2017-09-30 MED ORDER — FUROSEMIDE 10 MG/ML IJ SOLN
40.0000 mg | Freq: Once | INTRAMUSCULAR | Status: AC
  Administered 2017-09-30: 40 mg via INTRAVENOUS
  Filled 2017-09-30: qty 4

## 2017-09-30 MED ORDER — SENNOSIDES-DOCUSATE SODIUM 8.6-50 MG PO TABS: 1.0000 | ORAL_TABLET | Freq: Every evening | ORAL | Status: DC | PRN

## 2017-09-30 MED ORDER — PIPERACILLIN-TAZOBACTAM 3.375 G IVPB
3.3750 g | Freq: Three times a day (TID) | INTRAVENOUS | Status: DC
  Administered 2017-09-30 – 2017-10-01 (×3): 3.375 g via INTRAVENOUS
  Filled 2017-09-30 (×3): qty 50

## 2017-09-30 MED ORDER — ACETAMINOPHEN 650 MG RE SUPP: 650.0000 mg | Freq: Four times a day (QID) | RECTAL | Status: DC | PRN

## 2017-09-30 MED ORDER — BISACODYL 5 MG PO TBEC: 5.0000 mg | DELAYED_RELEASE_TABLET | Freq: Every day | ORAL | Status: DC | PRN

## 2017-09-30 MED ORDER — CARVEDILOL 12.5 MG PO TABS
25.0000 mg | ORAL_TABLET | Freq: Two times a day (BID) | ORAL | Status: DC
  Administered 2017-10-02: 25 mg via ORAL
  Filled 2017-09-30: qty 1
  Filled 2017-09-30: qty 2
  Filled 2017-09-30: qty 1

## 2017-09-30 MED ORDER — IRBESARTAN 150 MG PO TABS
150.0000 mg | ORAL_TABLET | Freq: Every day | ORAL | Status: DC
  Administered 2017-09-30: 150 mg via ORAL
  Filled 2017-09-30: qty 1

## 2017-09-30 MED ORDER — INSULIN ASPART 100 UNIT/ML ~~LOC~~ SOLN
0.0000 [IU] | Freq: Every day | SUBCUTANEOUS | Status: DC
  Administered 2017-09-30: 5 [IU] via SUBCUTANEOUS
  Administered 2017-10-01 – 2017-10-02 (×2): 2 [IU] via SUBCUTANEOUS
  Administered 2017-10-03: 4 [IU] via SUBCUTANEOUS
  Filled 2017-09-30 (×4): qty 1

## 2017-09-30 MED ORDER — PIPERACILLIN-TAZOBACTAM 3.375 G IVPB 30 MIN
3.3750 g | Freq: Once | INTRAVENOUS | Status: AC
  Administered 2017-09-30: 3.375 g via INTRAVENOUS
  Filled 2017-09-30: qty 50

## 2017-09-30 MED ORDER — TIMOLOL MALEATE 0.5 % OP SOLN
1.0000 [drp] | Freq: Every day | OPHTHALMIC | Status: DC
  Administered 2017-09-30 – 2017-10-14 (×13): 1 [drp] via OPHTHALMIC
  Filled 2017-09-30 (×2): qty 5

## 2017-09-30 MED ORDER — ONDANSETRON HCL 4 MG PO TABS: 4.0000 mg | ORAL_TABLET | Freq: Four times a day (QID) | ORAL | Status: DC | PRN

## 2017-09-30 MED ORDER — ASPIRIN EC 81 MG PO TBEC
81.0000 mg | DELAYED_RELEASE_TABLET | Freq: Every day | ORAL | Status: DC
  Administered 2017-09-30 – 2017-10-14 (×13): 81 mg via ORAL
  Filled 2017-09-30 (×13): qty 1

## 2017-09-30 MED ORDER — HEPARIN (PORCINE) IN NACL 100-0.45 UNIT/ML-% IJ SOLN
950.0000 [IU]/h | INTRAMUSCULAR | Status: DC
  Administered 2017-09-30 – 2017-10-01 (×2): 650 [IU]/h via INTRAVENOUS
  Administered 2017-10-02 – 2017-10-04 (×2): 800 [IU]/h via INTRAVENOUS
  Filled 2017-09-30 (×3): qty 250

## 2017-09-30 MED ORDER — GI COCKTAIL ~~LOC~~
30.0000 mL | Freq: Three times a day (TID) | ORAL | Status: DC | PRN
  Administered 2017-10-01 – 2017-10-02 (×2): 30 mL via ORAL
  Filled 2017-09-30 (×4): qty 30

## 2017-09-30 MED ORDER — INSULIN ASPART 100 UNIT/ML ~~LOC~~ SOLN
0.0000 [IU] | Freq: Three times a day (TID) | SUBCUTANEOUS | Status: DC
  Administered 2017-09-30: 5 [IU] via SUBCUTANEOUS
  Administered 2017-09-30 (×2): 3 [IU] via SUBCUTANEOUS
  Administered 2017-10-01 (×2): 2 [IU] via SUBCUTANEOUS
  Administered 2017-10-01: 3 [IU] via SUBCUTANEOUS
  Administered 2017-10-02: 7 [IU] via SUBCUTANEOUS
  Administered 2017-10-02: 3 [IU] via SUBCUTANEOUS
  Administered 2017-10-02 – 2017-10-03 (×2): 5 [IU] via SUBCUTANEOUS
  Administered 2017-10-03: 2 [IU] via SUBCUTANEOUS
  Administered 2017-10-03: 5 [IU] via SUBCUTANEOUS
  Filled 2017-09-30 (×13): qty 1

## 2017-09-30 NOTE — Progress Notes (Signed)
Informed Dr Jefferson Fuel of patient's troponin >65.  Ordered stat EKG, repeat stat trop. Level, and Cardiology consult.  Dr. Saralyn Pilar aware of new consult.  Patient has one inch nitro paste on chest and has no c/o nausea, pain, pressure, or SOB.

## 2017-09-30 NOTE — H&P (Signed)
[facility] NAME: Ray Lamb    MR#:  [id number]  DATE OF BIRTH:  1930-03-15  DATE OF ADMISSION:  09/29/2017  PRIMARY CARE PHYSICIAN: [name], MD   REQUESTING/REFERRING PHYSICIAN: Paulette Blanch, MD  CHIEF COMPLAINT:   Chief Complaint  Patient presents with  . Emesis  . Diarrhea    HISTORY OF PRESENT ILLNESS:  Ray Lamb  is a 84 y.o. male with a known history of T2NIDDM, CKD IV p/w N/V/D/AP/CP. Pt is on BiPAP, and is unable to provide Hx/ROS. Hx obtained from pt's wife at bedside. She is a poor historian. She states that pt was recently started on lactulose for hyperkalemia. He took two doses, and had diarrhea x3d (Mon 08/12-Wed 08/14). He then developed N/V x3d Ray Lamb 08/15-Sat 08/17). N/V is postprandial. Pt has sharp midchest pain after PO intake. It radiates to the abdomen sometimes. The abdominal pain is described as sharp and diffuse. This has been going on x2-58mo, but became acutely worse today. He has chronic indigestion and heartburn. He denies exertional CP, and can ambulate x64min w/o CP. He denied F/C, diaphoresis, night sweats, rigors, cough, SOB, LH/LOC, urinary symptoms prior to coming to the hospital. He received IVF in ED, and developed SOB/hypoxemic respiratory failure necessitating BiPAP. Had a normal EF as of 05/24/2017 Echo (> 55%, mild TR + MR). Pt does not have a Hx of pulmonary edema/pleural effusions. SIRS (+), however, pt does not appear septic/toxic.  PAST MEDICAL HISTORY:   Past Medical History:  Diagnosis Date  . Anemia   . Chronic kidney disease    STAGE 4  . Coronary atherosclerosis   . Diabetes mellitus without complication ([place])   . Edema    ankle  . GERD (gastroesophageal reflux disease)   . Glaucoma   . Hearing loss   . Heart murmur   . History of hiatal hernia   . Hyperlipidemia   . Hypertension   . Mini stroke ([place])   . Mitral insufficiency   . Myocardial infarction ([place])    mild, heart cath done no stents needed  . Stroke [facility]) 2011   TIA    PAST SURGICAL HISTORY:   Past Surgical History:  Procedure Laterality Date  . CATARACT EXTRACTION W/PHACO Left 09/05/2016   Procedure: CATARACT EXTRACTION PHACO AND INTRAOCULAR LENS PLACEMENT (IOC);  Surgeon: Birder Robson, MD;  Location: [facility] ORS;  Service: Ophthalmology;  Laterality: Left;  [place] 00:43.1 AP% 22.6 CDE 9.77 Fluid Pack lot #[id number]  . CATARACT EXTRACTION W/PHACO Right 09/26/2016   Procedure: CATARACT EXTRACTION PHACO AND INTRAOCULAR LENS PLACEMENT ([place]);  Surgeon: Birder Robson, MD;  Location: [facility] ORS;  Service: Ophthalmology;  Laterality: Right;  [place] 01:00 AP% 16.0 CDE 9.71 Fluid pack lot # [id number]  . CORONARY ARTERY BYPASS GRAFT  2003  . ESOPHAGOGASTRODUODENOSCOPY (EGD) WITH PROPOFOL N/A 04/25/2016   Procedure: ESOPHAGOGASTRODUODENOSCOPY (EGD) WITH PROPOFOL;  Surgeon: Lucilla Lame, MD;  Location: [facility] ENDOSCOPY;  Service: Endoscopy;  Laterality: N/A;  . JOINT REPLACEMENT Right 2010   knee  . SKIN CANCER EXCISION      SOCIAL HISTORY:   Social History   Tobacco Use  . Smoking status: Former Smoker    Last attempt to quit: 09/02/1964    Years since quitting: 53.1  . Smokeless tobacco: Never Used  Substance Use Topics  . Alcohol use: No    Alcohol/week: 0.0 standard drinks    FAMILY HISTORY:   Family History  Problem  Relation Age of Onset  . Stroke Mother   . Diabetes Mother   . Diabetes Sister   . Diabetes Maternal Grandmother     DRUG ALLERGIES:   Allergies  Allergen Reactions  . Bee Venom Hives  . Iodinated Diagnostic Agents Rash  . Metrizamide Rash    REVIEW OF SYSTEMS:   Review of Systems  Constitutional: Negative for chills, diaphoresis, fever, malaise/fatigue and weight loss.  HENT: Negative for congestion, ear pain, hearing loss, nosebleeds, sinus pain, sore throat and tinnitus.   Eyes: Negative for blurred vision, double vision and photophobia.  Respiratory:  Positive for shortness of breath (in ED). Negative for cough, hemoptysis, sputum production and wheezing.   Cardiovascular: Positive for chest pain. Negative for palpitations, orthopnea, claudication, leg swelling and PND.  Gastrointestinal: Positive for abdominal pain, diarrhea, heartburn, nausea and vomiting. Negative for blood in stool, constipation and melena.  Genitourinary: Negative for dysuria, frequency, hematuria and urgency.  Musculoskeletal: Negative for back pain, joint pain, myalgias and neck pain.  Skin: Negative for itching and rash.  Neurological: Negative for dizziness, tingling, tremors, sensory change, speech change, focal weakness, seizures, loss of consciousness, weakness and headaches.  Psychiatric/Behavioral: Negative for memory loss. The patient does not have insomnia.    MEDICATIONS AT HOME:   Prior to Admission medications   Medication Sig Start Date End Date Taking? Authorizing Provider  atorvastatin (LIPITOR) 80 MG tablet Take 1 tablet (80 mg total) by mouth at bedtime. 02/26/17  Yes Crissman, Jeannette How, MD  carvedilol (COREG) 25 MG tablet Take 25 mg by mouth 2 (two) times daily.  06/28/17 06/28/18 Yes [provider]  donepezil (ARICEPT) 5 MG tablet Take 5 mg by mouth at bedtime.  03/22/16 09/30/17 Yes [provider]  ezetimibe (ZETIA) 10 MG tablet Take 1 tablet (10 mg total) by mouth daily. 02/26/17  Yes Crissman, Jeannette How, MD  furosemide (LASIX) 40 MG tablet Take 40 mg by mouth daily as needed.  11/17/15  Yes [provider]  glipiZIDE (GLUCOTROL) 5 MG tablet Take 1 tablet (5 mg total) by mouth daily. 02/26/17  Yes [name], MD  isosorbide mononitrate (IMDUR) 30 MG 24 hr tablet  05/17/15  Yes [provider]  JANUVIA 25 MG tablet Take 1 tablet (25 mg total) by mouth daily. 02/26/17  Yes Crissman, Mark A, MD  mometasone (ELOCON) 0.1 % lotion APPLY 4 DROPS INTO EACH EAR DAILY FOR 10 DAYS THEN AS NEEDED FOR DRY SKIN AND ITCHING 04/23/15  Yes  [provider]  Multiple Vitamin (MULTIVITAMIN) tablet Take 1 tablet by mouth daily.   Yes [provider]  olmesartan (BENICAR) 20 MG tablet Take 20 mg by mouth daily.    Yes [provider]  timolol (TIMOPTIC) 0.5 % ophthalmic solution Place 1 drop into both eyes daily.    Yes [provider]  TURMERIC PO Take 1,300 mg by mouth.   Yes [provider]  amoxicillin (AMOXIL) 500 MG capsule TAKE 4 CAPSULES BY MOUTH ONE HOUR PRIOR TO APPT 04/02/17   [provider]  aspirin EC 81 MG tablet Take 81 mg by mouth daily.    [provider]  digoxin (LANOXIN) 0.125 MG tablet Take 62.5 mcg by mouth every other day. 04/18/17   [provider]  metoprolol succinate (TOPROL-XL) 25 MG 24 hr tablet Take 1 tablet (25 mg total) by mouth daily. Patient not taking: Reported on 09/30/2017 02/26/17   [name], MD  VITAL SIGNS:  Blood pressure 129/80, pulse 93, temperature (!) 97.5 F (36.4 C), temperature source Oral, resp. rate (!) 27, height 5\' 7"  (1.702 m), weight 72.6 kg, SpO2 99 %.  PHYSICAL EXAMINATION:  Physical Exam  Constitutional: He is oriented to person, place, and time. He appears well-developed and well-nourished. He is active and cooperative.  Non-toxic appearance. He does not have a sickly appearance. He is not intubated. Face mask in place.  HENT:  Head: Normocephalic and atraumatic.  Mouth/Throat: Oropharynx is clear and moist. No oropharyngeal exudate.  Eyes: Conjunctivae, EOM and lids are normal. No scleral icterus.  Neck: Neck supple. No JVD present. No thyromegaly present.  Cardiovascular: Normal rate, regular rhythm, S1 normal, S2 normal and normal heart sounds.  No extrasystoles are present. Exam reveals no gallop, no S3, no S4, no distant heart sounds and no friction rub.  No murmur heard. Pulmonary/Chest: Effort normal. No accessory muscle usage or stridor. Tachypnea noted. No apnea and no bradypnea. He is  not intubated. No respiratory distress. He has decreased breath sounds in the right middle field, the right lower field, the left middle field and the left lower field. He has no wheezes. He has no rhonchi. He has rales in the right middle field, the right lower field, the left middle field and the left lower field.  Diffuse fine crackles.  Abdominal: Soft. He exhibits no distension. Bowel sounds are decreased. There is no tenderness. There is no rigidity, no rebound and no guarding.  Musculoskeletal: Normal range of motion. He exhibits edema (Trace/1+ B/L LE edema). He exhibits no tenderness.  Lymphadenopathy:    He has no cervical adenopathy.  Neurological: He is alert and oriented to person, place, and time. He is not disoriented.  Skin: Skin is warm, dry and intact. No rash noted. He is not diaphoretic. No erythema.  Psychiatric: He has a normal mood and affect. His behavior is normal. Judgment and thought content normal. Cognition and memory are normal.  On BiPAP.   LABORATORY PANEL:   CBC Recent Labs  Lab 09/29/17 2201  WBC 13.0*  HGB 9.7*  HCT 27.8*  PLT 281   ------------------------------------------------------------------------------------------------------------------  Chemistries  Recent Labs  Lab 09/29/17 2201  NA 126*  K 4.8  CL 98  CO2 20*  GLUCOSE 241*  BUN 43*  CREATININE 2.21*  CALCIUM 8.2*  AST 34  ALT 26  ALKPHOS 83  BILITOT 0.6   ------------------------------------------------------------------------------------------------------------------  Cardiac Enzymes Recent Labs  Lab 09/29/17 2201  TROPONINI 0.06*   ------------------------------------------------------------------------------------------------------------------  RADIOLOGY:  Ct Abdomen Pelvis Wo Contrast  Result Date: 09/30/2017 CLINICAL DATA:  Patient with nausea, vomiting and diarrhea. End-stage renal disease. EXAM: CT ABDOMEN AND PELVIS WITHOUT CONTRAST TECHNIQUE: Multidetector  CT imaging of the abdomen and pelvis was performed following the standard protocol without IV contrast. COMPARISON:  CT abdomen pelvis 03/16/2016. FINDINGS: Lower chest: Normal heart size. Interval development of bilateral ground-glass opacities and interlobular septal thickening. Small bilateral pleural effusions. Hepatobiliary: Liver is normal in size and contour. Multiple stones within the gallbladder lumen. No gallbladder wall thickening. No intrahepatic or extrahepatic biliary ductal dilatation. Pancreas: Unremarkable Spleen: Unremarkable Adrenals/Urinary Tract: Adrenal glands are normal. Bilateral perinephric fat stranding. Kidneys are symmetric in size. No hydronephrosis. Urinary bladder is unremarkable. Stomach/Bowel: Mild wall thickening of the rectum. Re demonstrated mild wall thickening of the intrathoracic stomach. No evidence for bowel obstruction. No free intraperitoneal air. Large hiatal hernia. Vascular/Lymphatic: Normal caliber abdominal aorta. Peripheral calcified atherosclerotic plaque. Interval increase in  size of retroperitoneal adenopathy with a reference 1.6 cm aortocaval lymph node (image 24; series 2), previously measuring 1.2 cm. Left periaortic lymph node measures 1.8 cm (image 26; series 2), previously 1.3 cm. Left periaortic lymph node measures 1.8 cm (image 36; series 2), previously 0.9 cm. Reference paraceliac node measures 1.4 cm (image 20; series 2), previously 0.9 cm. Reproductive: Prostate is enlarged. Other: Small bilateral fat containing inguinal hernias, left-greater-than-right. Musculoskeletal: Lumbar spine degenerative changes. No aggressive or acute appearing osseous lesions. IMPRESSION: 1. Interval increase in size of bulky retroperitoneal and upper abdominal adenopathy concerning for the possibility of lymphoma or metastatic disease. 2. Re demonstrated nonspecific mild wall thickening of the intrathoracic stomach. Large hiatal hernia. 3. Interval development of bilateral  lower lobe ground-glass opacities concerning for the possibility of edema. Small bilateral pleural effusions. 4. Mild wall thickening of the rectum.  Colitis not excluded. Electronically Signed   By: Lovey Newcomer M.D.   On: 09/30/2017 00:21   Dg Chest Port 1 View  Result Date: 09/30/2017 CLINICAL DATA:  Stage IV renal disease EXAM: PORTABLE CHEST 1 VIEW COMPARISON:  Chest radiograph 04/10/2009 FINDINGS: Monitoring leads overlie the patient. Stable cardiac and mediastinal contours. Large hiatal hernia. Diffuse bilateral interstitial pulmonary opacities. Small bilateral pleural effusions. IMPRESSION: Findings compatible with edema and small bilateral pleural effusions. Large hiatal hernia. Electronically Signed   By: Lovey Newcomer M.D.   On: 09/30/2017 00:46   IMPRESSION AND PLAN:   A/P: 1M N/V/D/AP/CP. CT A/P (+), "Interval increase in size of bulky retroperitoneal and upper abdominal adenopathy concerning for the possibility of lymphoma or metastatic disease," +  "Re-demonstrated nonspecific mild wall thickening of the intrathoracic stomach. Large hiatal hernia," + "Mild wall thickening of the rectum.  Colitis not excluded." Hypoxemic respiratory failure in ED, started on BiPAP. Hyponatremia, hyperglycemia (w/ T2NIDDM), Cr elevation/CKD IV, hypocalcemia, hypoalbuminemia, Troponin elevation, leukocytosis, normocytic anemia, subtherapeutic Digoxin level. -N/V/D/AP/CP: Diarrhea likely 2/2 lactulose. N/V/AP/CP appears to be acute exacerbation of a chronic issue pt has been experiencing x2-55mo now. Narrative suggests achalasia, esophageal spasm, hiatal hernia, GERD/gastritis, ulcer as potential etiologies. CT A/P notes, "nonspecific mild wall thickening of the intrathoracic stomach. Large hiatal hernia." I suspect this hiatal hernia is the most likely responsible for reported symptomatology. Imaging also notes "Mild wall thickening of the rectum.  Colitis not excluded." SIRS (+), technically meets criteria for  sepsis. Lactate 2.3. Received dose of Zosyn in ED, continued for now. PCT low, may be able to de-escalate. -Hypoxemic respiratory failure: Likely iatrogenic, 2/2 aggressive IVF administration in the setting of advanced CKD and hypoalbuminemia. EF > 55% as of 05/24/2017. CXR (+) pulmonary edema + small B/L pleural effusions. Started on BiPAP in ED. -Hyponatremia: Likely 2/2 N/V/D, poor intake. Has trace/1+ B/L LE edema on exam. EF > 55% as of 05/24/2017. Developed hypoxemic respiratory failure after receiving IVF in ED, possibly 2/2 CKD + hypoalbuminemia. Holding further IVF. Monitor BMP. -Hyperglycemia/T2NIDDM: SSI. Hold PO antihyperglycemics. -Cr elevation/CKD IV: Cr 2.21 on present admission, baseline 2.0-2.5. Monitor BMP, avoid nephrotoxins. -Hypocalcemia: Ionized calcium. -Hypoalbuminemia: Prealbumin. -Troponin elevation: Initial Trop-I 0.09, increased to 0.24 on rpt. Trend. Tele, continuous cardiac monitoring. [id number]. c/w statin, beta blocker, ARB. -Leukocytosis: WBC 13.0, SIRS (+). Reactive vs. infectious. On ABx as above. Monitor. -Normocytic anemia: Hgb stable compared to prior labwork from ~67mo ago. Likely anemia of chronic disease. No evidence of acute blood loss at present time. -Subtherapeutic Digoxin level: Digoxin level < 0.2. Resumed. -c/w home meds/formulary subs. -FEN/GI: Renal diabetic free  water restricted diet. -DVT PPx: Heparin. -Code status: Full code. -Disposition: Stepdown. Admission, > 2 midnights.   All the records are reviewed and case discussed with ED provider. Management plans discussed with the patient, family and they are in agreement.  CODE STATUS: Full code  TOTAL TIME TAKING CARE OF THIS PATIENT: 90 minutes.    Arta Silence M.D on 09/30/2017 at 1:27 AM  Between 7am to 6pm - Pager - [phone]  After 6pm go to [email or web address] - Proofreader  [facility]  Office  [phone]  CC: Primary care physician;  [name], MD   Note: This dictation was prepared with Dragon dictation along with smaller phrase technology. Any transcriptional errors that result from this process are unintentional.

## 2017-09-30 NOTE — Plan of Care (Signed)
Patient alert and oriented.  Remains on BiPap.  Fentanyl given for SOB, respiratory distress and abdominal discomfort with good affect. Patient able to rest for a couple of hours.  Wife remains at bedside.  No c/o SOB.  No oral meds given as patient is NPO at this time. Will continue to monitor.

## 2017-09-30 NOTE — Progress Notes (Signed)
CODE SEPSIS - PHARMACY COMMUNICATION  **Broad Spectrum Antibiotics should be administered within 1 hour of Sepsis diagnosis**  Time Code Sepsis Called/Page Received: 0147  Antibiotics Ordered: zosyn  Time of 1st antibiotic administration: 0042  Additional action taken by pharmacy:   If necessary, Name of Provider/Nurse Contacted:     Tobie Lords ,PharmD Clinical Pharmacist  09/30/2017  2:07 AM

## 2017-09-30 NOTE — Progress Notes (Signed)
Removed BiPAP mask from pt and placed on 2Lnc, RN aware. Pt tolerating well, will continue to monitor

## 2017-09-30 NOTE — Consult Note (Signed)
[facility] Cardiology  CARDIOLOGY CONSULT NOTE  Patient ID: EDDRICK DILONE MRN: [id number] DOB/AGE: [birth year] 84 y.o.  Admit date: 09/29/2017 Referring Physician Tressia Miners Primary Physician [facility] Primary Cardiologist Sheppard Coil Reason for Consultation non-ST elevation myocardial infarction  HPI: 84 year old gentleman referred for evaluation of elevated troponin consistent with non-ST elevation myocardial infarction.  The patient has known coronary disease, status post CABG 2003 at [facility], followed by [facility] cardiology.  He reports a several month history of intermittent chest discomfort, after meals, attributed to a large hiatal hernia.  Patient has stage IV chronic kidney disease, recently noted to have hyperkalemia, treated with lactulose, subsequently developed nausea, vomiting and diarrhea and presented to [facility] emergency room.  Initial lab work notable for elevated BUN and creatinine of 43 and 2.21, respectively.  Troponin was 0.26.  ECG reveals sinus rhythm with T wave inversions in leads I and aVL.  Patient was treated with intravenous fluids, developed respiratory failure, chest x-ray revealing pulmonary edema, and the patient was admitted to the ICU where he was treated with intravenous furosemide, with prompt diuresis and resolution of respiratory failure.  Follow-up troponin this morning is greater than 65, with resolution of chest pain.  Recent 2D echocardiogram 05/24/2017 revealed normal left ventricular function, with LVEF greater than 55%, with mild mitral and tricuspid regurgitation.  Review of systems complete and found to be negative unless listed above     Past Medical History:  Diagnosis Date  . Anemia   . Chronic kidney disease    STAGE 4  . Coronary atherosclerosis   . Diabetes mellitus without complication ([place])   . Edema    ankle  . GERD (gastroesophageal reflux disease)   . Glaucoma   . Hearing loss   . Heart murmur   . History of hiatal hernia   . Hyperlipidemia   .  Hypertension   . Mini stroke ([place])   . Mitral insufficiency   . Myocardial infarction ([place])    mild, heart cath done no stents needed  . Stroke [facility]) 2011   TIA    Past Surgical History:  Procedure Laterality Date  . CATARACT EXTRACTION W/PHACO Left 09/05/2016   Procedure: CATARACT EXTRACTION PHACO AND INTRAOCULAR LENS PLACEMENT (IOC);  Surgeon: Birder Robson, MD;  Location: [facility] ORS;  Service: Ophthalmology;  Laterality: Left;  [place] 00:43.1 AP% 22.6 CDE 9.77 Fluid Pack lot #[id number]  . CATARACT EXTRACTION W/PHACO Right 09/26/2016   Procedure: CATARACT EXTRACTION PHACO AND INTRAOCULAR LENS PLACEMENT ([place]);  Surgeon: Birder Robson, MD;  Location: [facility] ORS;  Service: Ophthalmology;  Laterality: Right;  [place] 01:00 AP% 16.0 CDE 9.71 Fluid pack lot # [id number]  . CORONARY ARTERY BYPASS GRAFT  2003  . ESOPHAGOGASTRODUODENOSCOPY (EGD) WITH PROPOFOL N/A 04/25/2016   Procedure: ESOPHAGOGASTRODUODENOSCOPY (EGD) WITH PROPOFOL;  Surgeon: Lucilla Lame, MD;  Location: [facility] ENDOSCOPY;  Service: Endoscopy;  Laterality: N/A;  . JOINT REPLACEMENT Right 2010   knee  . SKIN CANCER EXCISION      Medications Prior to Admission  Medication Sig Dispense Refill Last Dose  . atorvastatin (LIPITOR) 80 MG tablet Take 1 tablet (80 mg total) by mouth at bedtime. 90 tablet 4 Unknown at Unknown  . carvedilol (COREG) 25 MG tablet Take 25 mg by mouth 2 (two) times daily.    Unknown at Unknown  . donepezil (ARICEPT) 5 MG tablet Take 5 mg by mouth at bedtime.    Unknown at Unknown  . ezetimibe (ZETIA) 10 MG tablet Take 1 tablet (10 mg total) by mouth daily.  90 tablet 4 Unknown at Unknown  . furosemide (LASIX) 40 MG tablet Take 40 mg by mouth daily as needed.    prn at prn  . glipiZIDE (GLUCOTROL) 5 MG tablet Take 1 tablet (5 mg total) by mouth daily. 90 tablet 4 Unknown at Unknown  . isosorbide mononitrate (IMDUR) 30 MG 24 hr tablet    Unknown at Unknown  . JANUVIA 25 MG tablet Take 1 tablet (25 mg total) by mouth  daily. 90 tablet 4 Unknown at Unknown  . mometasone (ELOCON) 0.1 % lotion APPLY 4 DROPS INTO EACH EAR DAILY FOR 10 DAYS THEN AS NEEDED FOR DRY SKIN AND ITCHING  12 prn at prn  . Multiple Vitamin (MULTIVITAMIN) tablet Take 1 tablet by mouth daily.   Unknown at Unknown  . olmesartan (BENICAR) 20 MG tablet Take 20 mg by mouth daily.    Unknown at Unknown  . timolol (TIMOPTIC) 0.5 % ophthalmic solution Place 1 drop into both eyes daily.    Unknown at Unknown  . TURMERIC PO Take 1,300 mg by mouth.   Unknown at Unknown  . amoxicillin (AMOXIL) 500 MG capsule TAKE 4 CAPSULES BY MOUTH ONE HOUR PRIOR TO APPT   Completed Course at Unknown time  . aspirin EC 81 MG tablet Take 81 mg by mouth daily.   Not Taking at Unknown time  . digoxin (LANOXIN) 0.125 MG tablet Take 62.5 mcg by mouth every other day.  0 Not Taking at Unknown time  . metoprolol succinate (TOPROL-XL) 25 MG 24 hr tablet Take 1 tablet (25 mg total) by mouth daily. (Patient not taking: Reported on 09/30/2017) 90 tablet 4 Not Taking at Unknown time   Social History   Socioeconomic History  . Marital status: Married    Spouse name: Not on file  . Number of children: Not on file  . Years of education: Not on file  . Highest education level: Not on file  Occupational History  . Not on file  Social Needs  . Financial resource strain: Not on file  . Food insecurity:    Worry: Not on file    Inability: Not on file  . Transportation needs:    Medical: Not on file    Non-medical: Not on file  Tobacco Use  . Smoking status: Former Smoker    Last attempt to quit: 09/02/1964    Years since quitting: 53.1  . Smokeless tobacco: Never Used  Substance and Sexual Activity  . Alcohol use: No    Alcohol/week: 0.0 standard drinks  . Drug use: No  . Sexual activity: Not on file  Lifestyle  . Physical activity:    Days per week: Not on file    Minutes per session: Not on file  . Stress: Not on file  Relationships  . Social connections:     Talks on phone: Not on file    Gets together: Not on file    Attends religious service: Not on file    Active member of club or organization: Not on file    Attends meetings of clubs or organizations: Not on file    Relationship status: Not on file  . Intimate partner violence:    Fear of current or ex partner: Not on file    Emotionally abused: Not on file    Physically abused: Not on file    Forced sexual activity: Not on file  Other Topics Concern  . Not on file  Social History Narrative  . Not on  file    Family History  Problem Relation Age of Onset  . Stroke Mother   . Diabetes Mother   . Diabetes Sister   . Diabetes Maternal Grandmother       Review of systems complete and found to be negative unless listed above      PHYSICAL EXAM  General: Well developed, well nourished, in no acute distress HEENT:  Normocephalic and atramatic Neck:  No JVD.  Lungs: Clear bilaterally to auscultation and percussion. Heart: HRRR . Normal S1 and S2 without gallops or murmurs.  Abdomen: Bowel sounds are positive, abdomen soft and non-tender  Msk:  Back normal, normal gait. Normal strength and tone for age. Extremities: No clubbing, cyanosis or edema.   Neuro: Alert and oriented X 3. Psych:  Good affect, responds appropriately  Labs:   Lab Results  Component Value Date   WBC 13.6 (H) 09/30/2017   HGB 10.8 (L) 09/30/2017   HCT 31.8 (L) 09/30/2017   MCV 92.7 09/30/2017   PLT 280 09/30/2017    Recent Labs  Lab 09/29/17 2201 09/30/17 0709  NA 126* 128*  K 4.8 5.2*  CL 98 100  CO2 20* 19*  BUN 43* 46*  CREATININE 2.21* 2.28*  CALCIUM 8.2* 7.7*  PROT 7.9  --   BILITOT 0.6  --   ALKPHOS 83  --   ALT 26  --   AST 34  --   GLUCOSE 241* 289*   Lab Results  Component Value Date   TROPONINI >65.00 (HH) 09/30/2017    Lab Results  Component Value Date   CHOL 106 09/04/2017   CHOL 113 02/26/2017   CHOL 111 12/11/2016   Lab Results  Component Value Date   HDL 44  02/26/2017   HDL 44 02/01/2016   HDL 58 12/03/2014   Lab Results  Component Value Date   LDLCALC 54 02/26/2017   LDLCALC 60 02/01/2016   LDLCALC 70 12/03/2014   Lab Results  Component Value Date   TRIG 64 09/04/2017   TRIG 74 02/26/2017   TRIG 92 12/11/2016   Lab Results  Component Value Date   CHOLHDL 2.6 02/26/2017   CHOLHDL 2.7 02/01/2016   CHOLHDL 2.5 12/03/2014   No results found for: LDLDIRECT    Radiology: Ct Abdomen Pelvis Wo Contrast  Result Date: 09/30/2017 CLINICAL DATA:  Patient with nausea, vomiting and diarrhea. End-stage renal disease. EXAM: CT ABDOMEN AND PELVIS WITHOUT CONTRAST TECHNIQUE: Multidetector CT imaging of the abdomen and pelvis was performed following the standard protocol without IV contrast. COMPARISON:  CT abdomen pelvis 03/16/2016. FINDINGS: Lower chest: Normal heart size. Interval development of bilateral ground-glass opacities and interlobular septal thickening. Small bilateral pleural effusions. Hepatobiliary: Liver is normal in size and contour. Multiple stones within the gallbladder lumen. No gallbladder wall thickening. No intrahepatic or extrahepatic biliary ductal dilatation. Pancreas: Unremarkable Spleen: Unremarkable Adrenals/Urinary Tract: Adrenal glands are normal. Bilateral perinephric fat stranding. Kidneys are symmetric in size. No hydronephrosis. Urinary bladder is unremarkable. Stomach/Bowel: Mild wall thickening of the rectum. Re demonstrated mild wall thickening of the intrathoracic stomach. No evidence for bowel obstruction. No free intraperitoneal air. Large hiatal hernia. Vascular/Lymphatic: Normal caliber abdominal aorta. Peripheral calcified atherosclerotic plaque. Interval increase in size of retroperitoneal adenopathy with a reference 1.6 cm aortocaval lymph node (image 24; series 2), previously measuring 1.2 cm. Left periaortic lymph node measures 1.8 cm (image 26; series 2), previously 1.3 cm. Left periaortic lymph node measures  1.8 cm (image 36; series 2), previously  0.9 cm. Reference paraceliac node measures 1.4 cm (image 20; series 2), previously 0.9 cm. Reproductive: Prostate is enlarged. Other: Small bilateral fat containing inguinal hernias, left-greater-than-right. Musculoskeletal: Lumbar spine degenerative changes. No aggressive or acute appearing osseous lesions. IMPRESSION: 1. Interval increase in size of bulky retroperitoneal and upper abdominal adenopathy concerning for the possibility of lymphoma or metastatic disease. 2. Re demonstrated nonspecific mild wall thickening of the intrathoracic stomach. Large hiatal hernia. 3. Interval development of bilateral lower lobe ground-glass opacities concerning for the possibility of edema. Small bilateral pleural effusions. 4. Mild wall thickening of the rectum.  Colitis not excluded. Electronically Signed   By: Lovey Newcomer M.D.   On: 09/30/2017 00:21   Dg Chest Port 1 View  Result Date: 09/30/2017 CLINICAL DATA:  Stage IV renal disease EXAM: PORTABLE CHEST 1 VIEW COMPARISON:  Chest radiograph 04/10/2009 FINDINGS: Monitoring leads overlie the patient. Stable cardiac and mediastinal contours. Large hiatal hernia. Diffuse bilateral interstitial pulmonary opacities. Small bilateral pleural effusions. IMPRESSION: Findings compatible with edema and small bilateral pleural effusions. Large hiatal hernia. Electronically Signed   By: Lovey Newcomer M.D.   On: 09/30/2017 00:46    EKG: Sinus rhythm with T wave inversions leads I and aVL  ASSESSMENT AND PLAN:   1.  Non-ST elevation myocardial infarction, troponin greater than 65, currently chest pain-free 2.  Known CAD, status post CABG 2003 3.  Stage IV chronic kidney disease, with recent episode of hyperkalemia, potassium 5.2 4.  Respiratory failure, following IV fluid administration, resolved after diuresis  Recommendations  1.  Agree with overall current therapy 2.  Heparin bolus and drip 3.  Review 2D echocardiogram 4.   Discussed with patient and patient's wife about the possibility of cardiac catheterization.  Patient has known contrast dye allergy, as well as, stage IV chronic kidney disease with potential high risk for contrast-induced nephrotoxicity and renal failure.  In light of this risk, patient and patient's wife agreeable to initial conservative management.  Signed: Isaias Cowman MD,PhD, [facility] 09/30/2017, 9:48 AM

## 2017-09-30 NOTE — Progress Notes (Addendum)
ANTICOAGULATION CONSULT NOTE - Initial Consult  Pharmacy Consult for Heparin drip Indication: Troponin > 65   Allergies  Allergen Reactions  . Bee Venom Hives  . Iodinated Diagnostic Agents Rash  . Metrizamide Rash    Patient Measurements: Height: 5\' 6"  (167.6 cm) Weight: 157 lb 6.5 oz (71.4 kg) IBW/kg (Calculated) : 63.8 Heparin Dosing Weight: 71.4 kg  Vital Signs: Temp: 97.4 F (36.3 C) (08/18 0800) Temp Source: Axillary (08/18 0800) BP: 123/65 (08/18 0900) Pulse Rate: 72 (08/18 0900)  Labs: Recent Labs    09/29/17 2201 09/30/17 0058 09/30/17 0709 09/30/17 0819 09/30/17 0847  HGB 9.7*  --   --  10.8*  --   HCT 27.8*  --   --  31.8*  --   PLT 281  --   --  280  --   CREATININE 2.21*  --  2.28*  --   --   TROPONINI 0.06* 0.26* >65.00*  --  >65.00*    Estimated Creatinine Clearance: 21.4 mL/min (A) (by C-G formula based on SCr of 2.28 mg/dL (H)).   Medical History: Past Medical History:  Diagnosis Date  . Anemia   . Chronic kidney disease    STAGE 4  . Coronary atherosclerosis   . Diabetes mellitus without complication ([place])   . Edema    ankle  . GERD (gastroesophageal reflux disease)   . Glaucoma   . Hearing loss   . Heart murmur   . History of hiatal hernia   . Hyperlipidemia   . Hypertension   . Mini stroke ([place])   . Mitral insufficiency   . Myocardial infarction ([place])    mild, heart cath done no stents needed  . Stroke [facility]) 2011   TIA    Medications:  Scheduled:  . aspirin EC  81 mg Oral Daily  . atorvastatin  80 mg Oral QHS  . carvedilol  25 mg Oral BID  . donepezil  5 mg Oral QHS  . furosemide  40 mg Oral Daily  . heparin  4,000 Units Intravenous Once  . insulin aspart  0-5 Units Subcutaneous QHS  . insulin aspart  0-9 Units Subcutaneous TID WC  . irbesartan  150 mg Oral Daily  . isosorbide mononitrate  30 mg Oral Daily  . multivitamin with minerals  1 tablet Oral Daily  . timolol  1 drop Both Eyes Daily   Infusions:  .  famotidine (PEPCID) IV    . heparin    . magnesium sulfate 1 - 4 g bolus IVPB 2 g (09/30/17 0900)  . piperacillin-tazobactam      Assessment: 84 yo M to start Heparin drip for Troponin > 65 x 2.  Patient not on Anticoagulant medication PTA per Med Rec. Patient did receive Heparin 5000 units subcutaneously this am at 0631. Hgb 10.8, Plt 280  aptt 29  INR 1.08 Scr 2.28   Goal of Therapy:  Heparin level 0.3-0.7 units/ml Monitor platelets by anticoagulation protocol: Yes   Plan:  Give 4000 units bolus x 1 Start heparin infusion at 850 units/hr Check anti-Xa level in 8 hours and daily while on heparin Continue to monitor H&H and platelets Will discontinue Heparin subcutaneous order.  Julissa Browning A 09/30/2017,9:56 AM

## 2017-09-30 NOTE — Consult Note (Signed)
PULMONARY / CRITICAL CARE MEDICINE   Name: Ray Lamb MRN: [id number] DOB: Sep 27, 1930    ADMISSION DATE:  09/29/2017   CONSULTATION DATE:  09/30/17  REFERRING MD:  Dr Jodell Cipro  REASON: Acute respiratory failure  HISTORY OF PRESENT ILLNESS:   This is an 84 year old male with a history of chronic kidney disease stage IV, type 2 diabetes, CAD and CVA presented to the ED with complaints of nausea vomiting and diarrhea x3 days.  Nausea vomiting and diarrhea also associated with abdominal pain which he describes as diffuse and sharp.  He attributes it to his hiatal hernia.  He states that he was given some medications by his MD to treat his high potassium and the medication gave him a horrible diarrhea.  At the ED, he said he was given IV fluids and became acutely short of breath.  His chest x-ray showed diffuse pulmonary edema.  He is being admitted to the ICU for further management  PAST MEDICAL HISTORY :  He  has a past medical history of Anemia, Chronic kidney disease, Coronary atherosclerosis, Diabetes mellitus without complication ([place]), Edema, GERD (gastroesophageal reflux disease), Glaucoma, Hearing loss, Heart murmur, History of hiatal hernia, Hyperlipidemia, Hypertension, Mini stroke ([place]), Mitral insufficiency, Myocardial infarction [facility]), and Stroke ([place]) (2011).  PAST SURGICAL HISTORY: He  has a past surgical history that includes Esophagogastroduodenoscopy (egd) with propofol (N/A, 04/25/2016); Cataract extraction w/PHACO (Left, 09/05/2016); Coronary artery bypass graft (2003); Joint replacement (Right, 2010); Skin cancer excision; and Cataract extraction w/PHACO (Right, 09/26/2016).  Allergies  Allergen Reactions  . Bee Venom Hives  . Iodinated Diagnostic Agents Rash  . Metrizamide Rash    No current facility-administered medications on file prior to encounter.    Current Outpatient Medications on File Prior to Encounter  Medication Sig  . atorvastatin (LIPITOR) 80 MG tablet  Take 1 tablet (80 mg total) by mouth at bedtime.  . carvedilol (COREG) 25 MG tablet Take 25 mg by mouth 2 (two) times daily.   Marland Kitchen donepezil (ARICEPT) 5 MG tablet Take 5 mg by mouth at bedtime.   Marland Kitchen ezetimibe (ZETIA) 10 MG tablet Take 1 tablet (10 mg total) by mouth daily.  . furosemide (LASIX) 40 MG tablet Take 40 mg by mouth daily as needed.   Marland Kitchen glipiZIDE (GLUCOTROL) 5 MG tablet Take 1 tablet (5 mg total) by mouth daily.  . isosorbide mononitrate (IMDUR) 30 MG 24 hr tablet   . JANUVIA 25 MG tablet Take 1 tablet (25 mg total) by mouth daily.  . mometasone (ELOCON) 0.1 % lotion APPLY 4 DROPS INTO EACH EAR DAILY FOR 10 DAYS THEN AS NEEDED FOR DRY SKIN AND ITCHING  . Multiple Vitamin (MULTIVITAMIN) tablet Take 1 tablet by mouth daily.  Marland Kitchen olmesartan (BENICAR) 20 MG tablet Take 20 mg by mouth daily.   . timolol (TIMOPTIC) 0.5 % ophthalmic solution Place 1 drop into both eyes daily.   . TURMERIC PO Take 1,300 mg by mouth.  Marland Kitchen amoxicillin (AMOXIL) 500 MG capsule TAKE 4 CAPSULES BY MOUTH ONE HOUR PRIOR TO APPT  . aspirin EC 81 MG tablet Take 81 mg by mouth daily.  . digoxin (LANOXIN) 0.125 MG tablet Take 62.5 mcg by mouth every other day.  . metoprolol succinate (TOPROL-XL) 25 MG 24 hr tablet Take 1 tablet (25 mg total) by mouth daily. (Patient not taking: Reported on 09/30/2017)    FAMILY HISTORY:  His family history includes Diabetes in his maternal grandmother, mother, and sister; Stroke in his mother.  SOCIAL  HISTORY: He  reports that he quit smoking about 53 years ago. He has never used smokeless tobacco. He reports that he does not drink alcohol or use drugs.  REVIEW OF SYSTEMS:   Unable to obtain as patient is on continuous BiPAP  SUBJECTIVE:    VITAL SIGNS: BP 133/81   Pulse 96   Temp (!) 97.5 F (36.4 C) (Oral)   Resp (!) 27   Ht 5\' 6"  (1.676 m)   Wt 71.4 kg   SpO2 100%   BMI 25.41 kg/m   HEMODYNAMICS:    VENTILATOR SETTINGS: FiO2 (%):  [60 %] 60 %  INTAKE / OUTPUT: No  intake/output data recorded.  PHYSICAL EXAMINATION: General: Moderate respiratory distress Neuro: Alert and oriented x3 no focal deficits HEENT: PERRLA, mild JVD, trachea midline Cardiovascular: Apical pulse regular, S1-S2, murmur regurg or gallop Lungs: Bilateral breath sounds diminished in the bases, no wheezes, diffuse rhonchi in anterior lung fields and bibasilar crackles Abdomen: Distended, normal bowel sounds in all 4 quadrants Musculoskeletal: Joint deformities, positive range of motion Skin: Warm and dry  LABS:  BMET Recent Labs  Lab 09/29/17 2201  NA 126*  K 4.8  CL 98  CO2 20*  BUN 43*  CREATININE 2.21*  GLUCOSE 241*    Electrolytes Recent Labs  Lab 09/29/17 2201 09/30/17 0058  CALCIUM 8.2*  --   MG  --  1.5*  PHOS  --  2.5    CBC Recent Labs  Lab 09/29/17 2201  WBC 13.0*  HGB 9.7*  HCT 27.8*  PLT 281    Coag's No results for input(s): APTT, INR in the last 168 hours.  Sepsis Markers Recent Labs  Lab 09/30/17 0058  LATICACIDVEN 2.3*  PROCALCITON <0.10    ABG Recent Labs  Lab 09/30/17 0032  PHART 7.20*  PCO2ART 37  PO2ART 75*    Liver Enzymes Recent Labs  Lab 09/29/17 2201  AST 34  ALT 26  ALKPHOS 83  BILITOT 0.6  ALBUMIN 3.2*    Cardiac Enzymes Recent Labs  Lab 09/29/17 2201 09/30/17 0058  TROPONINI 0.06* 0.26*    Glucose No results for input(s): GLUCAP in the last 168 hours.  Imaging Ct Abdomen Pelvis Wo Contrast  Result Date: 09/30/2017 CLINICAL DATA:  Patient with nausea, vomiting and diarrhea. End-stage renal disease. EXAM: CT ABDOMEN AND PELVIS WITHOUT CONTRAST TECHNIQUE: Multidetector CT imaging of the abdomen and pelvis was performed following the standard protocol without IV contrast. COMPARISON:  CT abdomen pelvis 03/16/2016. FINDINGS: Lower chest: Normal heart size. Interval development of bilateral ground-glass opacities and interlobular septal thickening. Small bilateral pleural effusions. Hepatobiliary:  Liver is normal in size and contour. Multiple stones within the gallbladder lumen. No gallbladder wall thickening. No intrahepatic or extrahepatic biliary ductal dilatation. Pancreas: Unremarkable Spleen: Unremarkable Adrenals/Urinary Tract: Adrenal glands are normal. Bilateral perinephric fat stranding. Kidneys are symmetric in size. No hydronephrosis. Urinary bladder is unremarkable. Stomach/Bowel: Mild wall thickening of the rectum. Re demonstrated mild wall thickening of the intrathoracic stomach. No evidence for bowel obstruction. No free intraperitoneal air. Large hiatal hernia. Vascular/Lymphatic: Normal caliber abdominal aorta. Peripheral calcified atherosclerotic plaque. Interval increase in size of retroperitoneal adenopathy with a reference 1.6 cm aortocaval lymph node (image 24; series 2), previously measuring 1.2 cm. Left periaortic lymph node measures 1.8 cm (image 26; series 2), previously 1.3 cm. Left periaortic lymph node measures 1.8 cm (image 36; series 2), previously 0.9 cm. Reference paraceliac node measures 1.4 cm (image 20; series 2), previously 0.9 cm.  Reproductive: Prostate is enlarged. Other: Small bilateral fat containing inguinal hernias, left-greater-than-right. Musculoskeletal: Lumbar spine degenerative changes. No aggressive or acute appearing osseous lesions. IMPRESSION: 1. Interval increase in size of bulky retroperitoneal and upper abdominal adenopathy concerning for the possibility of lymphoma or metastatic disease. 2. Re demonstrated nonspecific mild wall thickening of the intrathoracic stomach. Large hiatal hernia. 3. Interval development of bilateral lower lobe ground-glass opacities concerning for the possibility of edema. Small bilateral pleural effusions. 4. Mild wall thickening of the rectum.  Colitis not excluded. Electronically Signed   By: Lovey Newcomer M.D.   On: 09/30/2017 00:21   Dg Chest Port 1 View  Result Date: 09/30/2017 CLINICAL DATA:  Stage IV renal disease  EXAM: PORTABLE CHEST 1 VIEW COMPARISON:  Chest radiograph 04/10/2009 FINDINGS: Monitoring leads overlie the patient. Stable cardiac and mediastinal contours. Large hiatal hernia. Diffuse bilateral interstitial pulmonary opacities. Small bilateral pleural effusions. IMPRESSION: Findings compatible with edema and small bilateral pleural effusions. Large hiatal hernia. Electronically Signed   By: Lovey Newcomer M.D.   On: 09/30/2017 00:46   STUDIES:  Last 2D echo 05/24/2017 with a left ventricular ejection fraction of 55  CULTURES: Blood cultures x2  ANTIBIOTICS: Zosyn for intra-abdominal infection  SIGNIFICANT EVENTS: 09/29/2017: Admitted  LINES/TUBES: Peripheral IVs  DISCUSSION: 84 year old male presenting with acute pulmonary edema, acute hypoxic respiratory failure requiring BiPAP, acute renal failure, nausea vomiting and diarrhea secondary to medication side effects  ASSESSMENT Acute hypoxic respiratory failure requiring BiPAP Acute pulmonary edema Nausea vomiting and diarrhea Acute on chronic renal failure Elevated troponin-likely due to demand ischemia Hypomagnesemia Chest pain Hiatus hernia   PLAN Continues BiPAP and titrate to nasal cannula as tolerated Lasix 40 mg IV x1 now Nitroglycerin topical  1 inch every 6 hours PRN Zofran for nausea and vomiting Trend cardiac enzymes Trend procalcitonin and if normal discontinue antibiotics Current fentanyl for pain GI cocktail for epigastric pain Pepcid 20 mg IV every 12 Relay all home medications GI and DVT prophylaxis FAMILY  - Updates: Patient and spouse updated at bedside.  Discussed CODE STATUS with patient.  Patient indicates that he does not want to prolong any situations that will make his quality of life poor.  He has agreed to a DNR/DNI status.   - Inter-disciplinary family meet or Palliative Care meeting due by:  day 7    Jermain [facility] ANP-BC Pulmonary and Critical Care Medicine [facility] Pager  [phone] or [phone]  NB: This document was prepared using Dragon voice recognition software and may include unintentional dictation errors.    09/30/2017, 3:09 AM

## 2017-09-30 NOTE — Progress Notes (Signed)
84 year old male with past medical history significant for CAD status post CABG, CKD stage IV, diabetes, history of stroke, anemia, hypertension presents to hospital secondary to nausea vomiting and noted to have elevated troponin. Agree with current management.  Patient will be monitored in ICU today.  Was on BiPAP this morning Elevated troponin, started on heparin drip.  Possible non-STEMI.  Due to contrast dye allergy, CKD stage IV and delicate respiratory condition at this time-seems family agreed for conservative management at this time. -Monitor closely

## 2017-09-30 NOTE — Progress Notes (Signed)
Pharmacy Antibiotic Note  Ray Lamb is a 84 y.o. male admitted on 09/29/2017 with intra-abdominal infection.  Pharmacy has been consulted for Zosyn dosing.  Plan: Zosyn 3.375g IV q8h (4 hour infusion).  Height: 5\' 6"  (167.6 cm) Weight: 157 lb 6.5 oz (71.4 kg) IBW/kg (Calculated) : 63.8  Temp (24hrs), Avg:97.5 F (36.4 C), Min:97.5 F (36.4 C), Max:97.5 F (36.4 C)  Recent Labs  Lab 09/29/17 2201 09/30/17 0058 09/30/17 0416 09/30/17 0709  WBC 13.0*  --   --   --   CREATININE 2.21*  --   --  2.28*  LATICACIDVEN  --  2.3* 1.9  --     Estimated Creatinine Clearance: 21.4 mL/min (A) (by C-G formula based on SCr of 2.28 mg/dL (H)).    Allergies  Allergen Reactions  . Bee Venom Hives  . Iodinated Diagnostic Agents Rash  . Metrizamide Rash    Antimicrobials this admission: Zosyn 8/18 >>     Dose adjustments this admission: N/A  Microbiology results: 8/18 BCx: NG x 2 8/18 MRSA PCR: Negative  Thank you for allowing pharmacy to be a part of this patient's care.  Olivia Canter, [facility] 09/30/2017 8:33 AM

## 2017-09-30 NOTE — ED Notes (Signed)
Asked ED Provider to bedside. present

## 2017-09-30 NOTE — Progress Notes (Signed)
Pharmacy Electrolyte Monitoring Consult:  Pharmacy consulted to assist in monitoring and replacing electrolytes in this 84 y.o. male admitted on 09/29/2017 with Emesis and Diarrhea   Labs:  Sodium (mmol/L)  Date Value  09/30/2017 128 (L)  09/04/2017 137   Potassium (mmol/L)  Date Value  09/30/2017 5.2 (H)   Magnesium (mg/dL)  Date Value  09/30/2017 1.5 (L)   Phosphorus (mg/dL)  Date Value  09/30/2017 2.5   Calcium (mg/dL)  Date Value  09/30/2017 7.7 (L)   Albumin (g/dL)  Date Value  09/29/2017 3.2 (L)  02/26/2017 3.9    Assessment/Plan:     Patient received Mag 2g IV this morning. No additional supplementation is warranted at this time.  Will recheck electrolytes with am labs.    Olivia Canter, [facility] 09/30/2017 10:41 AM

## 2017-09-30 NOTE — Progress Notes (Signed)
ANTICOAGULATION CONSULT NOTE - Initial Consult  Pharmacy Consult for Heparin drip Indication: Troponin > 65   Allergies  Allergen Reactions  . Bee Venom Hives  . Iodinated Diagnostic Agents Rash  . Metrizamide Rash    Patient Measurements: Height: 5\' 6"  (167.6 cm) Weight: 157 lb 6.5 oz (71.4 kg) IBW/kg (Calculated) : 63.8 Heparin Dosing Weight: 71.4 kg  Vital Signs: Temp: 97.4 F (36.3 C) (08/18 0800) Temp Source: Axillary (08/18 0800) BP: 121/63 (08/18 1800) Pulse Rate: 79 (08/18 1800)  Labs: Recent Labs    09/29/17 2201  09/30/17 0709 09/30/17 0819 09/30/17 0847 09/30/17 1023 09/30/17 1326 09/30/17 1808  HGB 9.7*  --   --  10.8*  --   --   --   --   HCT 27.8*  --   --  31.8*  --   --   --   --   PLT 281  --   --  280  --   --   --   --   APTT  --   --   --   --   --  29  --   --   LABPROT  --   --   --   --   --  13.9  --   --   INR  --   --   --   --   --  1.08  --   --   HEPARINUNFRC  --   --   --   --   --   --   --  1.26*  CREATININE 2.21*  --  2.28*  --   --   --   --   --   TROPONINI 0.06*   < > >65.00*  --  >65.00*  --  >65.00* >65.00*   < > = values in this interval not displayed.    Estimated Creatinine Clearance: 21.4 mL/min (A) (by C-G formula based on SCr of 2.28 mg/dL (H)).   Medical History: Past Medical History:  Diagnosis Date  . Anemia   . Chronic kidney disease    STAGE 4  . Coronary atherosclerosis   . Diabetes mellitus without complication ([place])   . Edema    ankle  . GERD (gastroesophageal reflux disease)   . Glaucoma   . Hearing loss   . Heart murmur   . History of hiatal hernia   . Hyperlipidemia   . Hypertension   . Mini stroke ([place])   . Mitral insufficiency   . Myocardial infarction ([place])    mild, heart cath done no stents needed  . Stroke [facility]) 2011   TIA    Medications:  Scheduled:  . aspirin EC  81 mg Oral Daily  . atorvastatin  80 mg Oral QHS  . carvedilol  25 mg Oral BID  . donepezil  5 mg Oral QHS  .  furosemide  40 mg Oral Daily  . insulin aspart  0-5 Units Subcutaneous QHS  . insulin aspart  0-9 Units Subcutaneous TID WC  . irbesartan  150 mg Oral Daily  . isosorbide mononitrate  30 mg Oral Daily  . multivitamin with minerals  1 tablet Oral Daily  . timolol  1 drop Both Eyes Daily   Infusions:  . famotidine (PEPCID) IV Stopped (09/30/17 1516)  . heparin    . piperacillin-tazobactam 3.375 g (09/30/17 1822)    Assessment: 84 yo M to start Heparin drip for Troponin > 65 x 2.  Patient not  on Anticoagulant medication PTA per Med Rec. Patient did receive Heparin 5000 units subcutaneously this am at 0631. Hgb 10.8, Plt 280  aptt 29  INR 1.08 Scr 2.28  Heparin infusing at 850 units/hr 8/18 18:00 Heparin level 1.26   Goal of Therapy:  Heparin level 0.3-0.7 units/ml Monitor platelets by anticoagulation protocol: Yes   Plan:  Hold Heparin infusion for 1 hour, restart at 650 units/hr. Will check Heparin level 8 hours after restart. Daily CBC while on Heparin infusion.  Paulina Fusi, PharmD, BCPS 09/30/2017 7:45 PM

## 2017-09-30 NOTE — ED Notes (Signed)
RT at bedside.

## 2017-10-01 ENCOUNTER — Inpatient Hospital Stay: Payer: Medicare Other

## 2017-10-01 LAB — PROCALCITONIN: Procalcitonin: 4.11 ng/mL

## 2017-10-01 LAB — CBC
HCT: 28.6 % — ABNORMAL LOW (ref 40.0–52.0)
Hemoglobin: 9.6 g/dL — ABNORMAL LOW (ref 13.0–18.0)
MCH: 30.8 pg (ref 26.0–34.0)
MCHC: 33.7 g/dL (ref 32.0–36.0)
MCV: 91.6 fL (ref 80.0–100.0)
PLATELETS: 289 10*3/uL (ref 150–440)
RBC: 3.12 MIL/uL — AB (ref 4.40–5.90)
RDW: 14.1 % (ref 11.5–14.5)
WBC: 17.9 10*3/uL — AB (ref 3.8–10.6)

## 2017-10-01 LAB — BASIC METABOLIC PANEL
ANION GAP: 10 (ref 5–15)
BUN: 50 mg/dL — ABNORMAL HIGH (ref 8–23)
CALCIUM: 7.8 mg/dL — AB (ref 8.9–10.3)
CO2: 20 mmol/L — ABNORMAL LOW (ref 22–32)
Chloride: 99 mmol/L (ref 98–111)
Creatinine, Ser: 2.91 mg/dL — ABNORMAL HIGH (ref 0.61–1.24)
GFR, EST AFRICAN AMERICAN: 21 mL/min — AB (ref >60)
GFR, EST NON AFRICAN AMERICAN: 18 mL/min — AB (ref >60)
Glucose, Bld: 171 mg/dL — ABNORMAL HIGH (ref 70–99)
POTASSIUM: 4.3 mmol/L (ref 3.5–5.1)
Sodium: 129 mmol/L — ABNORMAL LOW (ref 135–145)

## 2017-10-01 LAB — GLUCOSE, CAPILLARY
GLUCOSE-CAPILLARY: 383 mg/dL — AB (ref 70–99)
Glucose-Capillary: 172 mg/dL — ABNORMAL HIGH (ref 70–99)
Glucose-Capillary: 214 mg/dL — ABNORMAL HIGH (ref 70–99)
Glucose-Capillary: 180 mg/dL — ABNORMAL HIGH (ref 70–99)
Glucose-Capillary: 229 mg/dL — ABNORMAL HIGH (ref 70–99)

## 2017-10-01 LAB — CALCIUM, IONIZED: CALCIUM, IONIZED, SERUM: 4.1 mg/dL — AB (ref 4.5–5.6)

## 2017-10-01 LAB — HEPARIN LEVEL (UNFRACTIONATED)
Heparin Unfractionated: 0.51 IU/mL (ref 0.30–0.70)
Heparin Unfractionated: 0.36 IU/mL (ref 0.30–0.70)

## 2017-10-01 LAB — MAGNESIUM: Magnesium: 2.2 mg/dL (ref 1.7–2.4)

## 2017-10-01 LAB — PHOSPHORUS: PHOSPHORUS: 2.7 mg/dL (ref 2.5–4.6)

## 2017-10-01 MED ORDER — MELATONIN 5 MG PO TABS
5.0000 mg | ORAL_TABLET | Freq: Every evening | ORAL | Status: DC | PRN
  Administered 2017-10-01 – 2017-10-13 (×5): 5 mg via ORAL
  Filled 2017-10-01 (×7): qty 1

## 2017-10-01 MED ORDER — FAMOTIDINE IN NACL 20-0.9 MG/50ML-% IV SOLN
20.0000 mg | INTRAVENOUS | Status: DC
  Administered 2017-10-02: 20 mg via INTRAVENOUS
  Filled 2017-10-01: qty 50

## 2017-10-01 MED ORDER — LINAGLIPTIN 5 MG PO TABS
5.0000 mg | ORAL_TABLET | Freq: Every day | ORAL | Status: DC
  Administered 2017-10-01 – 2017-10-14 (×12): 5 mg via ORAL
  Filled 2017-10-01 (×14): qty 1

## 2017-10-01 MED ORDER — SODIUM CHLORIDE 0.9 % IV SOLN
3.0000 g | Freq: Two times a day (BID) | INTRAVENOUS | Status: DC
  Administered 2017-10-01 – 2017-10-02 (×3): 3 g via INTRAVENOUS
  Filled 2017-10-01 (×5): qty 3

## 2017-10-01 MED ORDER — OXYCODONE-ACETAMINOPHEN 5-325 MG PO TABS
1.0000 | ORAL_TABLET | Freq: Four times a day (QID) | ORAL | Status: DC | PRN
  Administered 2017-10-01: 1 via ORAL
  Filled 2017-10-01: qty 1

## 2017-10-01 NOTE — Progress Notes (Signed)
Pharmacy Electrolyte Monitoring Consult:  Pharmacy consulted to assist in monitoring and replacing electrolytes in this 84 y.o. male admitted on 09/29/2017 with Emesis and Diarrhea   Labs:  Sodium (mmol/L)  Date Value  10/01/2017 129 (L)  09/04/2017 137   Potassium (mmol/L)  Date Value  10/01/2017 4.3   Magnesium (mg/dL)  Date Value  10/01/2017 2.2   Phosphorus (mg/dL)  Date Value  10/01/2017 2.7   Calcium (mg/dL)  Date Value  10/01/2017 7.8 (L)   Albumin (g/dL)  Date Value  09/29/2017 3.2 (L)  02/26/2017 3.9    Assessment/Plan:     No supplementation is warranted at this time.   Will recheck electrolytes with am labs.    Pernell Dupre, PharmD, BCPS Clinical Pharmacist 10/01/2017 7:27 AM

## 2017-10-01 NOTE — Progress Notes (Signed)
[facility] [place] NAME: Ray Lamb    MR#:  [id number]  DATE OF BIRTH:  Jun 09, 1930  SUBJECTIVE:  CHIEF COMPLAINT:   Chief Complaint  Patient presents with  . Emesis  . Diarrhea  Case discussed with intensivist, patient doing better this morning, on oxygen via nasal cannula  REVIEW OF SYSTEMS:  CONSTITUTIONAL: No fever, fatigue or weakness.  EYES: No blurred or double vision.  EARS, NOSE, AND THROAT: No tinnitus or ear pain.  RESPIRATORY: No cough, shortness of breath, wheezing or hemoptysis.  CARDIOVASCULAR: No chest pain, orthopnea, edema.  GASTROINTESTINAL: No nausea, vomiting, diarrhea or abdominal pain.  GENITOURINARY: No dysuria, hematuria.  ENDOCRINE: No polyuria, nocturia,  HEMATOLOGY: No anemia, easy bruising or bleeding SKIN: No rash or lesion. MUSCULOSKELETAL: No joint pain or arthritis.   NEUROLOGIC: No tingling, numbness, weakness.  PSYCHIATRY: No anxiety or depression.   ROS  DRUG ALLERGIES:   Allergies  Allergen Reactions  . Bee Venom Hives  . Iodinated Diagnostic Agents Rash  . Metrizamide Rash    VITALS:  Blood pressure (!) 94/51, pulse 69, temperature 98.4 F (36.9 C), temperature source Oral, resp. rate (!) 21, height 5\' 6"  (1.676 m), weight 71.4 kg, SpO2 98 %.  PHYSICAL EXAMINATION:  GENERAL:  84 y.o.-year-old patient lying in the bed with no acute distress.  EYES: Pupils equal, round, reactive to light and accommodation. No scleral icterus. Extraocular muscles intact.  HEENT: Head atraumatic, normocephalic. Oropharynx and nasopharynx clear.  NECK:  Supple, no jugular venous distention. No thyroid enlargement, no tenderness.  LUNGS: Normal breath sounds bilaterally, no wheezing, rales,rhonchi or crepitation. No use of accessory muscles of respiration.  CARDIOVASCULAR: S1, S2 normal. No murmurs, rubs, or gallops.  ABDOMEN: Soft, nontender, nondistended. Bowel sounds present. No organomegaly or mass.   EXTREMITIES: No pedal edema, cyanosis, or clubbing.  NEUROLOGIC: Cranial nerves II through XII are intact. Muscle strength 5/5 in all extremities. Sensation intact. Gait not checked.  PSYCHIATRIC: The patient is alert and oriented x 3.  SKIN: No obvious rash, lesion, or ulcer.   Physical Exam LABORATORY PANEL:   CBC Recent Labs  Lab 10/01/17 0434  WBC 17.9*  HGB 9.6*  HCT 28.6*  PLT 289   ------------------------------------------------------------------------------------------------------------------  Chemistries  Recent Labs  Lab 09/29/17 2201  10/01/17 0434  NA 126*   < > 129*  K 4.8   < > 4.3  CL 98   < > 99  CO2 20*   < > 20*  GLUCOSE 241*   < > 171*  BUN 43*   < > 50*  CREATININE 2.21*   < > 2.91*  CALCIUM 8.2*   < > 7.8*  MG  --    < > 2.2  AST 34  --   --   ALT 26  --   --   ALKPHOS 83  --   --   BILITOT 0.6  --   --    < > = values in this interval not displayed.   ------------------------------------------------------------------------------------------------------------------  Cardiac Enzymes Recent Labs  Lab 09/30/17 1326 09/30/17 1808  TROPONINI >65.00* >65.00*   ------------------------------------------------------------------------------------------------------------------  RADIOLOGY:  Ct Abdomen Pelvis Wo Contrast  Result Date: 09/30/2017 CLINICAL DATA:  Patient with nausea, vomiting and diarrhea. End-stage renal disease. EXAM: CT ABDOMEN AND PELVIS WITHOUT CONTRAST TECHNIQUE: Multidetector CT imaging of the abdomen and pelvis was performed following the standard protocol without IV contrast. COMPARISON:  CT abdomen pelvis 03/16/2016. FINDINGS: Lower  chest: Normal heart size. Interval development of bilateral ground-glass opacities and interlobular septal thickening. Small bilateral pleural effusions. Hepatobiliary: Liver is normal in size and contour. Multiple stones within the gallbladder lumen. No gallbladder wall thickening. No intrahepatic  or extrahepatic biliary ductal dilatation. Pancreas: Unremarkable Spleen: Unremarkable Adrenals/Urinary Tract: Adrenal glands are normal. Bilateral perinephric fat stranding. Kidneys are symmetric in size. No hydronephrosis. Urinary bladder is unremarkable. Stomach/Bowel: Mild wall thickening of the rectum. Re demonstrated mild wall thickening of the intrathoracic stomach. No evidence for bowel obstruction. No free intraperitoneal air. Large hiatal hernia. Vascular/Lymphatic: Normal caliber abdominal aorta. Peripheral calcified atherosclerotic plaque. Interval increase in size of retroperitoneal adenopathy with a reference 1.6 cm aortocaval lymph node (image 24; series 2), previously measuring 1.2 cm. Left periaortic lymph node measures 1.8 cm (image 26; series 2), previously 1.3 cm. Left periaortic lymph node measures 1.8 cm (image 36; series 2), previously 0.9 cm. Reference paraceliac node measures 1.4 cm (image 20; series 2), previously 0.9 cm. Reproductive: Prostate is enlarged. Other: Small bilateral fat containing inguinal hernias, left-greater-than-right. Musculoskeletal: Lumbar spine degenerative changes. No aggressive or acute appearing osseous lesions. IMPRESSION: 1. Interval increase in size of bulky retroperitoneal and upper abdominal adenopathy concerning for the possibility of lymphoma or metastatic disease. 2. Re demonstrated nonspecific mild wall thickening of the intrathoracic stomach. Large hiatal hernia. 3. Interval development of bilateral lower lobe ground-glass opacities concerning for the possibility of edema. Small bilateral pleural effusions. 4. Mild wall thickening of the rectum.  Colitis not excluded. Electronically Signed   By: Lovey Newcomer M.D.   On: 09/30/2017 00:21   Dg Chest Port 1 View  Result Date: 10/01/2017 CLINICAL DATA:  Respiratory failure.  Myocardial infarction. EXAM: PORTABLE CHEST 1 VIEW COMPARISON:  09/30/2017 FINDINGS: Stable cardiac enlargement. Stable to slightly  improved interstitial edema. No significant pleural effusions identified. No pneumothorax. IMPRESSION: Stable to slightly improved pulmonary interstitial edema. Electronically Signed   By: Aletta Edouard M.D.   On: 10/01/2017 08:28   Dg Chest Port 1 View  Result Date: 09/30/2017 CLINICAL DATA:  Respiratory failure. Shortness of breath and chest pain EXAM: PORTABLE CHEST 1 VIEW COMPARISON:  09/30/2017 FINDINGS: Cardiomegaly and CABG changes again noted. Bilateral interstitial and airspace opacities have slightly improved. There is no evidence of pneumothorax. Hiatal hernia and probable trace effusions noted. No other significant change. IMPRESSION: Slightly improved bilateral interstitial/airspace opacities without other significant change. Electronically Signed   By: Margarette Canada M.D.   On: 09/30/2017 11:27   Dg Chest Port 1 View  Result Date: 09/30/2017 CLINICAL DATA:  Stage IV renal disease EXAM: PORTABLE CHEST 1 VIEW COMPARISON:  Chest radiograph 04/10/2009 FINDINGS: Monitoring leads overlie the patient. Stable cardiac and mediastinal contours. Large hiatal hernia. Diffuse bilateral interstitial pulmonary opacities. Small bilateral pleural effusions. IMPRESSION: Findings compatible with edema and small bilateral pleural effusions. Large hiatal hernia. Electronically Signed   By: Lovey Newcomer M.D.   On: 09/30/2017 00:46    ASSESSMENT AND PLAN:  *Acute NSTEMI Stable Cardiology input appreciated-aggressive medical management given advanced age/dye allergy/chronic stage IV kidney disease, continue ACS protocol, aspirin, Plavix, heparin heparin drip for 48-72 hours, statin therapy, carvedilol, Avapro, Isordil, supplemental oxygen PRN, PRN nitrates, IV morphine for breakthrough pain  *Acute hypoxic respiratory failure  Resolving  Currently on oxygen via nasal cannula, did require BiPAP   *Acute colitis  Resolving  Continuing Unasyn follow-up on outstanding cultures   *Acute kidney injury   Worsening noted Avoid nephrotoxic agents, strict I&O monitoring, daily  weights, BMP in the morning  *Acute hyponatremia Improving  For transfer to regular nursing floor bed later today  All the records are reviewed and case discussed with Care Management/Social Workerr. Management plans discussed with the patient, family and they are in agreement.  CODE STATUS: dnr  TOTAL TIME TAKING CARE OF THIS PATIENT: 45 minutes.     POSSIBLE D/C IN 2-5 DAYS, DEPENDING ON CLINICAL CONDITION.   Avel Peace Loida Calamia M.D on 10/01/2017   Between 7am to 6pm - Pager - [phone]  After 6pm go to [email or web address] - password [email or web address] [facility]  Office  [phone]  CC: Primary care physician; [name], MD  Note: This dictation was prepared with Dragon dictation along with smaller phrase technology. Any transcriptional errors that result from this process are unintentional.

## 2017-10-01 NOTE — Progress Notes (Signed)
Talked to Dr. Duane Boston about patient's complaints of "idigestion". Gave GI cocktail at 2055 and gave tylenol at 2210 without relief. Order given for percocet 1 tab q6, prn. Also asked if patient can have Melatonin to help patient sleep, order given. RN will continue to monitor.

## 2017-10-01 NOTE — Plan of Care (Signed)
Patient had uneventful night.  Asleep most of shift.  Wife at bedside. No c/o respiratory distress or chest, abdominal pain.   No medication changes.  Will continue to monitor.

## 2017-10-01 NOTE — Progress Notes (Signed)
[facility] Card[facility]  SUBJECTIVE: Patient laying in bed, denies chest pain or shortness of breath   Vitals:   10/01/17 0300 10/01/17 0400 10/01/17 0500 10/01/17 0600  BP: (!) 87/48 (!) 95/50 (!) 99/52 (!) 94/51  Pulse: 74 74 71 69  Resp: (!) 28 (!) 27 (!) 27 (!) 21  Temp:      TempSrc:      SpO2: 98% 98% 98% 98%  Weight:      Height:         Intake/Output Summary (Last 24 hours) at 10/01/2017 7782 Last data filed at 10/01/2017 0000 Gross per 24 hour  Intake 235.37 ml  Output 850 ml  Net -614.63 ml      PHYSICAL EXAM  General: Well developed, well nourished, in no acute distress HEENT:  Normocephalic and atramatic Neck:  No JVD.  Lungs: Clear bilaterally to auscultation and percussion. Heart: HRRR . Normal S1 and S2 without gallops or murmurs.  Abdomen: Bowel sounds are positive, abdomen soft and non-tender  Msk:  Back normal, normal gait. Normal strength and tone for age. Extremities: No clubbing, cyanosis or edema.   Neuro: Alert and oriented X 3. Psych:  Good affect, responds appropriately   LABS: Basic Metabolic Panel: Recent Labs    09/30/17 0058 09/30/17 0709 10/01/17 0434  NA  --  128* 129*  K  --  5.2* 4.3  CL  --  100 99  CO2  --  19* 20*  GLUCOSE  --  289* 171*  BUN  --  46* 50*  CREATININE  --  2.28* 2.91*  CALCIUM  --  7.7* 7.8*  MG 1.5*  --  2.2  PHOS 2.5  --  2.7   Liver Function Tests: Recent Labs    09/29/17 2201  AST 34  ALT 26  ALKPHOS 83  BILITOT 0.6  PROT 7.9  ALBUMIN 3.2*   Recent Labs    09/29/17 2201  LIPASE 45   CBC: Recent Labs    09/30/17 0819 10/01/17 0434  WBC 13.6* 17.9*  HGB 10.8* 9.6*  HCT 31.8* 28.6*  MCV 92.7 91.6  PLT 280 289   Cardiac Enzymes: Recent Labs    09/30/17 0847 09/30/17 1326 09/30/17 1808  TROPONINI >65.00* >65.00* >65.00*   BNP: Invalid input(s): POCBNP D-Dimer: No results for input(s): DDIMER in the last 72 hours. Hemoglobin A1C: No results for input(s): HGBA1C in the last 72  hours. Fasting Lipid Panel: No results for input(s): CHOL, HDL, LDLCALC, TRIG, CHOLHDL, LDLDIRECT in the last 72 hours. Thyroid Function Tests: No results for input(s): TSH, T4TOTAL, T3FREE, THYROIDAB in the last 72 hours.  Invalid input(s): FREET3 Anemia Panel: No results for input(s): VITAMINB12, FOLATE, FERRITIN, TIBC, IRON, RETICCTPCT in the last 72 hours.  Ct Abdomen Pelvis Wo Contrast  Result Date: 09/30/2017 CLINICAL DATA:  Patient with nausea, vomiting and diarrhea. End-stage renal disease. EXAM: CT ABDOMEN AND PELVIS WITHOUT CONTRAST TECHNIQUE: Multidetector CT imaging of the abdomen and pelvis was performed following the standard protocol without IV contrast. COMPARISON:  CT abdomen pelvis 03/16/2016. FINDINGS: Lower chest: Normal heart size. Interval development of bilateral ground-glass opacities and interlobular septal thickening. Small bilateral pleural effusions. Hepatobiliary: Liver is normal in size and contour. Multiple stones within the gallbladder lumen. No gallbladder wall thickening. No intrahepatic or extrahepatic biliary ductal dilatation. Pancreas: Unremarkable Spleen: Unremarkable Adrenals/Urinary Tract: Adrenal glands are normal. Bilateral perinephric fat stranding. Kidneys are symmetric in size. No hydronephrosis. Urinary bladder is unremarkable. Stomach/Bowel: Mild wall thickening of  the rectum. Re demonstrated mild wall thickening of the intrathoracic stomach. No evidence for bowel obstruction. No free intraperitoneal air. Large hiatal hernia. Vascular/Lymphatic: Normal caliber abdominal aorta. Peripheral calcified atherosclerotic plaque. Interval increase in size of retroperitoneal adenopathy with a reference 1.6 cm aortocaval lymph node (image 24; series 2), previously measuring 1.2 cm. Left periaortic lymph node measures 1.8 cm (image 26; series 2), previously 1.3 cm. Left periaortic lymph node measures 1.8 cm (image 36; series 2), previously 0.9 cm. Reference paraceliac  node measures 1.4 cm (image 20; series 2), previously 0.9 cm. Reproductive: Prostate is enlarged. Other: Small bilateral fat containing inguinal hernias, left-greater-than-right. Musculoskeletal: Lumbar spine degenerative changes. No aggressive or acute appearing osseous lesions. IMPRESSION: 1. Interval increase in size of bulky retroperitoneal and upper abdominal adenopathy concerning for the possibility of lymphoma or metastatic disease. 2. Re demonstrated nonspecific mild wall thickening of the intrathoracic stomach. Large hiatal hernia. 3. Interval development of bilateral lower lobe ground-glass opacities concerning for the possibility of edema. Small bilateral pleural effusions. 4. Mild wall thickening of the rectum.  Colitis not excluded. Electronically Signed   By: Lovey Newcomer M.D.   On: 09/30/2017 00:21   Dg Chest Port 1 View  Result Date: 09/30/2017 CLINICAL DATA:  Respiratory failure. Shortness of breath and chest pain EXAM: PORTABLE CHEST 1 VIEW COMPARISON:  09/30/2017 FINDINGS: Cardiomegaly and CABG changes again noted. Bilateral interstitial and airspace opacities have slightly improved. There is no evidence of pneumothorax. Hiatal hernia and probable trace effusions noted. No other significant change. IMPRESSION: Slightly improved bilateral interstitial/airspace opacities without other significant change. Electronically Signed   By: Margarette Canada M.D.   On: 09/30/2017 11:27   Dg Chest Port 1 View  Result Date: 09/30/2017 CLINICAL DATA:  Stage IV renal disease EXAM: PORTABLE CHEST 1 VIEW COMPARISON:  Chest radiograph 04/10/2009 FINDINGS: Monitoring leads overlie the patient. Stable cardiac and mediastinal contours. Large hiatal hernia. Diffuse bilateral interstitial pulmonary opacities. Small bilateral pleural effusions. IMPRESSION: Findings compatible with edema and small bilateral pleural effusions. Large hiatal hernia. Electronically Signed   By: Lovey Newcomer M.D.   On: 09/30/2017 00:46      Echo LVEF 35 to 40% with anteroseptal and apical akinesis  TELEMETRY: Sinus rhythm:  ASSESSMENT AND PLAN:  Active Problems:   Acute hypoxemic respiratory failure ([place])    1.  Non-ST elevation myocardial infarction, troponin >65, no recurrent chest pain, based on age, and chronic kidney disease, not a candidate for redo CABG 2.  CAD, status post CABG 2003 3.  Stage IV chronic kidney disease 4.  Respiratory failure following IV fluid administration, resolved after diuresis 5.  Contrast dye allergy  Recommendations  1.  Agree with current therapy 2.  Continue heparin drip for 48 to 72 hours 3.  Add Plavix 4.  Will pursue initial conservative management per patient and patient's wife, in light of advanced age, contrast dye allergy, and stage IV chronic kidney disease, with high risk for contrast-induced nephrotoxicity and renal failure and potential need for chronic dialysis.     Isaias Cowman, MD, PhD, [facility] 10/01/2017 7:28 AM

## 2017-10-01 NOTE — Progress Notes (Signed)
ANTICOAGULATION CONSULT NOTE - Initial Consult  Pharmacy Consult for Heparin drip Indication: Troponin > 65   Allergies  Allergen Reactions  . Bee Venom Hives  . Iodinated Diagnostic Agents Rash  . Metrizamide Rash    Patient Measurements: Height: 5\' 6"  (167.6 cm) Weight: 157 lb 6.5 oz (71.4 kg) IBW/kg (Calculated) : 63.8 Heparin Dosing Weight: 71.4 kg  Vital Signs: Temp: 98.4 F (36.9 C) (08/19 0200) Temp Source: Oral (08/19 0200) BP: 94/51 (08/19 0600) Pulse Rate: 69 (08/19 0600)  Labs: Recent Labs    09/29/17 2201  09/30/17 0709 09/30/17 0819 09/30/17 0847 09/30/17 1023 09/30/17 1326 09/30/17 1808 10/01/17 0434 10/01/17 1223  HGB 9.7*  --   --  10.8*  --   --   --   --  9.6*  --   HCT 27.8*  --   --  31.8*  --   --   --   --  28.6*  --   PLT 281  --   --  280  --   --   --   --  289  --   APTT  --   --   --   --   --  29  --   --   --   --   LABPROT  --   --   --   --   --  13.9  --   --   --   --   INR  --   --   --   --   --  1.08  --   --   --   --   HEPARINUNFRC  --   --   --   --   --   --   --  1.26* 0.51 0.36  CREATININE 2.21*  --  2.28*  --   --   --   --   --  2.91*  --   TROPONINI 0.06*   < > >65.00*  --  >65.00*  --  >65.00* >65.00*  --   --    < > = values in this interval not displayed.    Estimated Creatinine Clearance: 16.7 mL/min (A) (by C-G formula based on SCr of 2.91 mg/dL (H)).   Medical History: Past Medical History:  Diagnosis Date  . Anemia   . Chronic kidney disease    STAGE 4  . Coronary atherosclerosis   . Diabetes mellitus without complication ([place])   . Edema    ankle  . GERD (gastroesophageal reflux disease)   . Glaucoma   . Hearing loss   . Heart murmur   . History of hiatal hernia   . Hyperlipidemia   . Hypertension   . Mini stroke ([place])   . Mitral insufficiency   . Myocardial infarction ([place])    mild, heart cath done no stents needed  . Stroke [facility]) 2011   TIA    Medications:  Scheduled:  . aspirin EC  81  mg Oral Daily  . atorvastatin  80 mg Oral QHS  . carvedilol  25 mg Oral BID  . donepezil  5 mg Oral QHS  . insulin aspart  0-5 Units Subcutaneous QHS  . insulin aspart  0-9 Units Subcutaneous TID WC  . irbesartan  150 mg Oral Daily  . isosorbide mononitrate  30 mg Oral Daily  . linagliptin  5 mg Oral Daily  . multivitamin with minerals  1 tablet Oral Daily  . timolol  1 drop  Both Eyes Daily   Infusions:  . ampicillin-sulbactam (UNASYN) IV 3 g (10/01/17 1201)  . [START ON 10/02/2017] famotidine (PEPCID) IV    . heparin 650 Units/hr (10/01/17 1005)    Assessment: 84 yo M to start Heparin drip for Troponin > 65 x 2.  Patient not on Anticoagulant medication PTA per Med Rec. Patient did receive Heparin 5000 units subcutaneously this am at 0631. Hgb 10.8, Plt 280  aptt 29  INR 1.08 Scr 2.28  Heparin infusing at 850 units/hr  08/19 @ 0430 HL 0.51- Therapeutic x 1   Goal of Therapy:  Heparin level 0.3-0.7 units/ml Monitor platelets by anticoagulation protocol: Yes   Plan:  08/19 1223 HL 0.36. Level is therapeutic x 2 now. Will Check next HL with AM labs CBC ordered with AM labs per protocol.   Pernell Dupre, PharmD, BCPS Clinical Pharmacist 10/01/2017 1:09 PM

## 2017-10-01 NOTE — Progress Notes (Signed)
Pharmacy Antibiotic Note  Ray Lamb is a 84 y.o. male admitted on 09/29/2017 with intra-abdominal infection.  Pharmacy has been consulted for Unasyn dosing.  PCT: 4.11  Plan: Start Unasyn 3g IV every 12 hours   Height: 5\' 6"  (167.6 cm) Weight: 157 lb 6.5 oz (71.4 kg) IBW/kg (Calculated) : 63.8  Temp (24hrs), Avg:98.3 F (36.8 C), Min:98.1 F (36.7 C), Max:98.4 F (36.9 C)  Recent Labs  Lab 09/29/17 2201 09/30/17 0058 09/30/17 0416 09/30/17 0709 09/30/17 0819 10/01/17 0434  WBC 13.0*  --   --   --  13.6* 17.9*  CREATININE 2.21*  --   --  2.28*  --  2.91*  LATICACIDVEN  --  2.3* 1.9  --   --   --     Estimated Creatinine Clearance: 16.7 mL/min (A) (by C-G formula based on SCr of 2.91 mg/dL (H)).    Allergies  Allergen Reactions  . Bee Venom Hives  . Iodinated Diagnostic Agents Rash  . Metrizamide Rash    Antimicrobials this admission: Zosyn 8/18 >>  8/19 8/19 Unasyn>>   Dose adjustments this admission: N/A  Microbiology results: 8/18 BCx: NG x 2 8/18 MRSA PCR: Negative  Thank you for allowing pharmacy to be a part of this patient's care.  Pernell Dupre, PharmD, BCPS Clinical Pharmacist 10/01/2017 9:20 AM

## 2017-10-01 NOTE — Progress Notes (Signed)
Follow up - Critical Care Medicine Note  Patient Details:    Ray Lamb is an 84 y.o. male. with chronic stage IV renal disease, type 2 diabetes, coronary artery disease, prior history of CVA presented to the emergency department with complaint of nausea, vomiting and diarrhea. He also relates to me now that he had associated chest pain.He was noted to have a high potassium in the outpatient setting, was most likely given Kayexalate and had diarrhea. He was admitted to the floor, received fluid resuscitation and went into pulmonary edema and was subsequently transferred to the intensive care unit. Initial troponin was 0.06 and follow up troponin was greater than 65. Repeat EKG was not significantly changed. Was seen by cardiology and felt secondary to renal failure patient was not an ideal PCI candidate and being treated medically.  Lines, Airways, Drains:    Anti-infectives:  Anti-infectives (From admission, onward)   Start     Dose/Rate Route Frequency Ordered Stop   10/01/17 1000  Ampicillin-Sulbactam (UNASYN) 3 g in sodium chloride 0.9 % 100 mL IVPB     3 g 200 mL/hr over 30 Minutes Intravenous Every 12 hours 10/01/17 0808     09/30/17 1200  piperacillin-tazobactam (ZOSYN) IVPB 3.375 g  Status:  Discontinued     3.375 g 12.5 mL/hr over 240 Minutes Intravenous Every 12 hours 09/30/17 0720 09/30/17 0836   09/30/17 1000  piperacillin-tazobactam (ZOSYN) IVPB 3.375 g  Status:  Discontinued     3.375 g 12.5 mL/hr over 240 Minutes Intravenous Every 8 hours 09/30/17 0836 10/01/17 0808   09/30/17 0030  piperacillin-tazobactam (ZOSYN) IVPB 3.375 g     3.375 g 100 mL/hr over 30 Minutes Intravenous  Once 09/30/17 0029 09/30/17 0129      Microbiology: Results for orders placed or performed during the hospital encounter of 09/29/17  Culture, blood (routine x 2)     Status: None (Preliminary result)   Collection Time: 09/30/17 12:58 AM  Result Value Ref Range Status   Specimen Description  BLOOD BLOOD LEFT FOREARM  Final   Special Requests   Final    BOTTLES DRAWN AEROBIC AND ANAEROBIC Blood Culture adequate volume   Culture   Final    NO GROWTH < 12 HOURS Performed at [facility], [address] [place], [place] [address]    Report Status PENDING  Incomplete  Culture, blood (routine x 2)     Status: None (Preliminary result)   Collection Time: 09/30/17 12:58 AM  Result Value Ref Range Status   Specimen Description BLOOD RIGHT ARM  Final   Special Requests   Final    BOTTLES DRAWN AEROBIC AND ANAEROBIC Blood Culture adequate volume   Culture   Final    NO GROWTH < 12 HOURS Performed at [facility], [address] [place], [place] [address]    Report Status PENDING  Incomplete  MRSA PCR Screening     Status: None   Collection Time: 09/30/17  1:47 AM  Result Value Ref Range Status   MRSA by PCR NEGATIVE NEGATIVE Final    Comment:        The GeneXpert MRSA Assay (FDA approved for NASAL specimens only), is one component of a comprehensive MRSA colonization surveillance program. It is not intended to diagnose MRSA infection nor to guide or monitor treatment for MRSA infections. Performed at [facility], [address] [place], [place] [address]   Studies: Ct Abdomen Pelvis Wo Contrast  Result Date: 09/30/2017 CLINICAL  DATA:  Patient with nausea, vomiting and diarrhea. End-stage renal disease. EXAM: CT ABDOMEN AND PELVIS WITHOUT CONTRAST TECHNIQUE: Multidetector CT imaging of the abdomen and pelvis was performed following the standard protocol without IV contrast. COMPARISON:  CT abdomen pelvis 03/16/2016. FINDINGS: Lower chest: Normal heart size. Interval development of bilateral ground-glass opacities and interlobular septal thickening. Small bilateral pleural effusions. Hepatobiliary: Liver is normal in size and contour. Multiple stones within the gallbladder lumen. No gallbladder wall thickening. No intrahepatic or extrahepatic  biliary ductal dilatation. Pancreas: Unremarkable Spleen: Unremarkable Adrenals/Urinary Tract: Adrenal glands are normal. Bilateral perinephric fat stranding. Kidneys are symmetric in size. No hydronephrosis. Urinary bladder is unremarkable. Stomach/Bowel: Mild wall thickening of the rectum. Re demonstrated mild wall thickening of the intrathoracic stomach. No evidence for bowel obstruction. No free intraperitoneal air. Large hiatal hernia. Vascular/Lymphatic: Normal caliber abdominal aorta. Peripheral calcified atherosclerotic plaque. Interval increase in size of retroperitoneal adenopathy with a reference 1.6 cm aortocaval lymph node (image 24; series 2), previously measuring 1.2 cm. Left periaortic lymph node measures 1.8 cm (image 26; series 2), previously 1.3 cm. Left periaortic lymph node measures 1.8 cm (image 36; series 2), previously 0.9 cm. Reference paraceliac node measures 1.4 cm (image 20; series 2), previously 0.9 cm. Reproductive: Prostate is enlarged. Other: Small bilateral fat containing inguinal hernias, left-greater-than-right. Musculoskeletal: Lumbar spine degenerative changes. No aggressive or acute appearing osseous lesions. IMPRESSION: 1. Interval increase in size of bulky retroperitoneal and upper abdominal adenopathy concerning for the possibility of lymphoma or metastatic disease. 2. Re demonstrated nonspecific mild wall thickening of the intrathoracic stomach. Large hiatal hernia. 3. Interval development of bilateral lower lobe ground-glass opacities concerning for the possibility of edema. Small bilateral pleural effusions. 4. Mild wall thickening of the rectum.  Colitis not excluded. Electronically Signed   By: Lovey Newcomer M.D.   On: 09/30/2017 00:21   Dg Chest Port 1 View  Result Date: 10/01/2017 CLINICAL DATA:  Respiratory failure.  Myocardial infarction. EXAM: PORTABLE CHEST 1 VIEW COMPARISON:  09/30/2017 FINDINGS: Stable cardiac enlargement. Stable to slightly improved  interstitial edema. No significant pleural effusions identified. No pneumothorax. IMPRESSION: Stable to slightly improved pulmonary interstitial edema. Electronically Signed   By: Aletta Edouard M.D.   On: 10/01/2017 08:28   Dg Chest Port 1 View  Result Date: 09/30/2017 CLINICAL DATA:  Respiratory failure. Shortness of breath and chest pain EXAM: PORTABLE CHEST 1 VIEW COMPARISON:  09/30/2017 FINDINGS: Cardiomegaly and CABG changes again noted. Bilateral interstitial and airspace opacities have slightly improved. There is no evidence of pneumothorax. Hiatal hernia and probable trace effusions noted. No other significant change. IMPRESSION: Slightly improved bilateral interstitial/airspace opacities without other significant change. Electronically Signed   By: Margarette Canada M.D.   On: 09/30/2017 11:27   Dg Chest Port 1 View  Result Date: 09/30/2017 CLINICAL DATA:  Stage IV renal disease EXAM: PORTABLE CHEST 1 VIEW COMPARISON:  Chest radiograph 04/10/2009 FINDINGS: Monitoring leads overlie the patient. Stable cardiac and mediastinal contours. Large hiatal hernia. Diffuse bilateral interstitial pulmonary opacities. Small bilateral pleural effusions. IMPRESSION: Findings compatible with edema and small bilateral pleural effusions. Large hiatal hernia. Electronically Signed   By: Lovey Newcomer M.D.   On: 09/30/2017 00:46    Consults: Treatment Team:  Arta Silence, MD Isaias Cowman, MD   Subjective:    Overnight Issues: No issues overnight. Patient's blood pressures have been on the soft side, he is awake alert and eating this morning denies any chest pain or shortness of breath on nasal  cannula.  Objective:  Vital signs for last 24 hours: Temp:  [98.1 F (36.7 C)-98.4 F (36.9 C)] 98.4 F (36.9 C) (08/19 0200) Pulse Rate:  [69-80] 69 (08/19 0600) Resp:  [15-29] 21 (08/19 0600) BP: (87-123)/(48-77) 94/51 (08/19 0600) SpO2:  [86 %-100 %] 98 % (08/19 0600) FiO2 (%):  [50 %] 50 %  (08/18 0900)  Hemodynamic parameters for last 24 hours:    Intake/Output from previous day: 08/18 0701 - 08/19 0700 In: 235.4 [P.O.:120; I.V.:40; IV Piggyback:75.4] Out: 850 [Urine:850]  Intake/Output this shift: No intake/output data recorded.  Vent settings for last 24 hours: FiO2 (%):  [50 %] 50 %  Physical Exam:  Vital signs: Please see the above listed vital signs HEENT: No oral lesions appreciated, trachea is midline, no thyromegaly appreciated, no accessory muscle utilization, no jugular venous distention Cardiovascular: Regular rate and rhythm Pulmonary: Clear to auscultation Abdominal: Soft exam Extremities: No clubbing cyanosis or edema is noted Neurologic: No clear focal deficits noted, moves all extremities  Assessment/Plan:   Acute hypoxemic respiratory failure requiring BiPAP. BiPAP has been weaned off successfully, patient is on 2 L nasal cannula in no shortness of breath and has diuresed 614 mL however limited by blood pressure  Patient is being treated empirically for colitis with Unasyn. white count is 17.9 pro calcitonin was 4.11  Hyponatremia at 129. Hesitant to give more fluids secondary to recent flash pulmonary edema  Renal failure. Patient's BUN is 50 and creatinine 2.91. This continues to rise, we'll hold off on any additional diuresis and monitor closely.  NSTEMI. Patient with troponin greater than 65. Appreciate cardiology's assistance. Patient is on aspirin, heparin, statin, carvedilol, Avapro, Isordil. Blood pressure medicines however are being held secondary to marginal blood pressure  At this point patient is stable for telemetry transfer  Vian 10/01/2017  *Care during the described time interval was provided by me and/or other providers on the critical care team.  I have reviewed this patient's available data, including medical history, events of note, physical examination and test results as part of my evaluation.

## 2017-10-01 NOTE — Progress Notes (Signed)
ANTICOAGULATION CONSULT NOTE - Initial Consult  Pharmacy Consult for Heparin drip Indication: Troponin > 65   Allergies  Allergen Reactions  . Bee Venom Hives  . Iodinated Diagnostic Agents Rash  . Metrizamide Rash    Patient Measurements: Height: 5\' 6"  (167.6 cm) Weight: 157 lb 6.5 oz (71.4 kg) IBW/kg (Calculated) : 63.8 Heparin Dosing Weight: 71.4 kg  Vital Signs: Temp: 98.4 F (36.9 C) (08/19 0200) Temp Source: Oral (08/19 0200) BP: 87/48 (08/19 0300) Pulse Rate: 74 (08/19 0300)  Labs: Recent Labs    09/29/17 2201  09/30/17 0709 09/30/17 0819 09/30/17 0847 09/30/17 1023 09/30/17 1326 09/30/17 1808 10/01/17 0434  HGB 9.7*  --   --  10.8*  --   --   --   --  9.6*  HCT 27.8*  --   --  31.8*  --   --   --   --  28.6*  PLT 281  --   --  280  --   --   --   --  289  APTT  --   --   --   --   --  29  --   --   --   LABPROT  --   --   --   --   --  13.9  --   --   --   INR  --   --   --   --   --  1.08  --   --   --   HEPARINUNFRC  --   --   --   --   --   --   --  1.26* 0.51  CREATININE 2.21*  --  2.28*  --   --   --   --   --  2.91*  TROPONINI 0.06*   < > >65.00*  --  >65.00*  --  >65.00* >65.00*  --    < > = values in this interval not displayed.    Estimated Creatinine Clearance: 16.7 mL/min (A) (by C-G formula based on SCr of 2.91 mg/dL (H)).   Medical History: Past Medical History:  Diagnosis Date  . Anemia   . Chronic kidney disease    STAGE 4  . Coronary atherosclerosis   . Diabetes mellitus without complication ([place])   . Edema    ankle  . GERD (gastroesophageal reflux disease)   . Glaucoma   . Hearing loss   . Heart murmur   . History of hiatal hernia   . Hyperlipidemia   . Hypertension   . Mini stroke ([place])   . Mitral insufficiency   . Myocardial infarction ([place])    mild, heart cath done no stents needed  . Stroke [facility] 2011   TIA    Medications:  Scheduled:  . aspirin EC  81 mg Oral Daily  . atorvastatin  80 mg Oral QHS  .  carvedilol  25 mg Oral BID  . donepezil  5 mg Oral QHS  . furosemide  40 mg Oral Daily  . insulin aspart  0-5 Units Subcutaneous QHS  . insulin aspart  0-9 Units Subcutaneous TID WC  . irbesartan  150 mg Oral Daily  . isosorbide mononitrate  30 mg Oral Daily  . multivitamin with minerals  1 tablet Oral Daily  . timolol  1 drop Both Eyes Daily   Infusions:  . famotidine (PEPCID) IV Stopped (10/01/17 0236)  . heparin 650 Units/hr (09/30/17 2040)  . piperacillin-tazobactam 3.375 g (10/01/17  0256)    Assessment: 84 yo M to start Heparin drip for Troponin > 65 x 2.  Patient not on Anticoagulant medication PTA per Med Rec. Patient did receive Heparin 5000 units subcutaneously this am at 0631. Hgb 10.8, Plt 280  aptt 29  INR 1.08 Scr 2.28  Heparin infusing at 850 units/hr 8/18 18:00 Heparin level 1.26   Goal of Therapy:  Heparin level 0.3-0.7 units/ml Monitor platelets by anticoagulation protocol: Yes   Plan:  08/19 @ 0430 HL 0.51 therapeutic. Will continue current rate at 650 units/hr and will recheck HL @ 1200. Hgb/hct has trended down will continue to monitor. Last trop still > 65.00. EKG shows ST wave abnormality w/ possible lateral ischemia.  Tobie Lords, PharmD, BCPS Clinical Pharmacist 10/01/2017

## 2017-10-02 LAB — GLUCOSE, CAPILLARY
GLUCOSE-CAPILLARY: 248 mg/dL — AB (ref 70–99)
Glucose-Capillary: 309 mg/dL — ABNORMAL HIGH (ref 70–99)
Glucose-Capillary: 325 mg/dL — ABNORMAL HIGH (ref 70–99)
Glucose-Capillary: 242 mg/dL — ABNORMAL HIGH (ref 70–99)
Glucose-Capillary: 263 mg/dL — ABNORMAL HIGH (ref 70–99)

## 2017-10-02 LAB — BASIC METABOLIC PANEL
Anion gap: 10 (ref 5–15)
BUN: 57 mg/dL — AB (ref 8–23)
CHLORIDE: 98 mmol/L (ref 98–111)
CO2: 20 mmol/L — ABNORMAL LOW (ref 22–32)
CREATININE: 3.73 mg/dL — AB (ref 0.61–1.24)
Calcium: 8 mg/dL — ABNORMAL LOW (ref 8.9–10.3)
GFR, EST AFRICAN AMERICAN: 16 mL/min — AB (ref >60)
GFR, EST NON AFRICAN AMERICAN: 14 mL/min — AB (ref >60)
Glucose, Bld: 307 mg/dL — ABNORMAL HIGH (ref 70–99)
POTASSIUM: 5.3 mmol/L — AB (ref 3.5–5.1)
SODIUM: 128 mmol/L — AB (ref 135–145)

## 2017-10-02 LAB — CBC
HCT: 27.1 % — ABNORMAL LOW (ref 40.0–52.0)
Hemoglobin: 9.2 g/dL — ABNORMAL LOW (ref 13.0–18.0)
MCH: 31.6 pg (ref 26.0–34.0)
MCHC: 33.9 g/dL (ref 32.0–36.0)
MCV: 93.1 fL (ref 80.0–100.0)
PLATELETS: 286 10*3/uL (ref 150–440)
RBC: 2.92 MIL/uL — AB (ref 4.40–5.90)
RDW: 14.2 % (ref 11.5–14.5)
WBC: 12.8 10*3/uL — ABNORMAL HIGH (ref 3.8–10.6)

## 2017-10-02 LAB — PHOSPHORUS: PHOSPHORUS: 4.3 mg/dL (ref 2.5–4.6)

## 2017-10-02 LAB — HEPARIN LEVEL (UNFRACTIONATED)
HEPARIN UNFRACTIONATED: 0.59 [IU]/mL (ref 0.30–0.70)
Heparin Unfractionated: 0.27 IU/mL — ABNORMAL LOW (ref 0.30–0.70)
Heparin Unfractionated: 0.47 IU/mL (ref 0.30–0.70)

## 2017-10-02 LAB — MAGNESIUM: MAGNESIUM: 2.4 mg/dL (ref 1.7–2.4)

## 2017-10-02 LAB — PROCALCITONIN: PROCALCITONIN: 3.14 ng/mL

## 2017-10-02 MED ORDER — SODIUM CHLORIDE 0.9 % IV SOLN
3.0000 g | INTRAVENOUS | Status: DC
  Administered 2017-10-03: 3 g via INTRAVENOUS
  Filled 2017-10-02 (×2): qty 3

## 2017-10-02 MED ORDER — FAMOTIDINE 20 MG PO TABS
20.0000 mg | ORAL_TABLET | Freq: Every day | ORAL | Status: DC
  Administered 2017-10-03 – 2017-10-14 (×10): 20 mg via ORAL
  Filled 2017-10-02 (×10): qty 1

## 2017-10-02 MED ORDER — NEPRO/CARBSTEADY PO LIQD
237.0000 mL | Freq: Three times a day (TID) | ORAL | Status: DC
  Administered 2017-10-02 – 2017-10-14 (×21): 237 mL via ORAL

## 2017-10-02 MED ORDER — NOREPINEPHRINE 4 MG/250ML-% IV SOLN
0.0000 ug/min | INTRAVENOUS | Status: DC
  Administered 2017-10-05: 2 ug/min via INTRAVENOUS
  Filled 2017-10-02: qty 250

## 2017-10-02 MED ORDER — SODIUM CHLORIDE 0.9 % IV BOLUS
250.0000 mL | Freq: Once | INTRAVENOUS | Status: AC
  Administered 2017-10-02: 250 mL via INTRAVENOUS

## 2017-10-02 MED ORDER — SODIUM CHLORIDE 0.9 % IV BOLUS
250.0000 mL | Freq: Once | INTRAVENOUS | Status: AC | PRN
  Administered 2017-10-02: 250 mL via INTRAVENOUS

## 2017-10-02 MED ORDER — HEPARIN BOLUS VIA INFUSION
1100.0000 [IU] | Freq: Once | INTRAVENOUS | Status: AC
  Administered 2017-10-02: 1100 [IU] via INTRAVENOUS
  Filled 2017-10-02: qty 1100

## 2017-10-02 NOTE — Significant Event (Signed)
Rapid Response Event Note  Overview: Time Called: 0955 Arrival Time: 0955 Event Type: Hypotension  Initial Focused Assessment: Rapid response RN arrived in patient's room with patient diaphoretic and lethargic, surrounded by son, patient's RN, and another 2A Therapist, sports. AC arrived shortly after rapid response RN. Patient had received 25 mg of carvedilol at 0850, pre administration BP was 112/66 (see vital signs flowsheet), but just before rapid response called patient became suddenly lethargic and diaphoretic. Patient was oriented but very lethargic, reported that he felt sleepy. Pulses weak but able to be felt. Initial BP 97/57, MAP 70 but proceeded with interventions due to symptoms.  Interventions: Patient laid flat. 250 mL bolus per rapid response protocols. Patient became more awake and skin dried, pulses slightly stronger. Patient's BP however continued to drop. Dr. Jerelyn Charles at bedside gave order for another 250 mL bolus and to transfer patient to stepdown.  Plan of Care (if not transferred): Transfer to ICU 17.  Event Summary: Name of Physician Notified: Dr. Jerelyn Charles at [phone]    at    Outcome: Transferred (Comment)(ICU 17)  Event End Time: [address] [place]

## 2017-10-02 NOTE — Progress Notes (Signed)
Follow up - Critical Care Medicine Note  Patient Details:    Ray Lamb is an 84 y.o. male. with chronic stage IV renal disease, type 2 diabetes, coronary artery disease, prior history of CVA presented to the emergency department with complaint of nausea, vomiting and diarrhea. He also relates to me now that he had associated chest pain.He was noted to have a high potassium in the outpatient setting, was most likely given Kayexalate and had diarrhea. He was admitted to the floor, received fluid resuscitation and went into pulmonary edema and was subsequently transferred to the intensive care unit. Initial troponin was 0.06 and follow up troponin was greater than 65. Repeat EKG was not significantly changed. Was seen by cardiology and felt secondary to renal failure patient was not an ideal PCI candidate and being treated medically.  Lines, Airways, Drains:    Anti-infectives:  Anti-infectives (From admission, onward)   Start     Dose/Rate Route Frequency Ordered Stop   10/01/17 1000  Ampicillin-Sulbactam (UNASYN) 3 g in sodium chloride 0.9 % 100 mL IVPB     3 g 200 mL/hr over 30 Minutes Intravenous Every 12 hours 10/01/17 0808     09/30/17 1200  piperacillin-tazobactam (ZOSYN) IVPB 3.375 g  Status:  Discontinued     3.375 g 12.5 mL/hr over 240 Minutes Intravenous Every 12 hours 09/30/17 0720 09/30/17 0836   09/30/17 1000  piperacillin-tazobactam (ZOSYN) IVPB 3.375 g  Status:  Discontinued     3.375 g 12.5 mL/hr over 240 Minutes Intravenous Every 8 hours 09/30/17 0836 10/01/17 0808   09/30/17 0030  piperacillin-tazobactam (ZOSYN) IVPB 3.375 g     3.375 g 100 mL/hr over 30 Minutes Intravenous  Once 09/30/17 0029 09/30/17 0129      Microbiology: Results for orders placed or performed during the hospital encounter of 09/29/17  Culture, blood (routine x 2)     Status: None (Preliminary result)   Collection Time: 09/30/17 12:58 AM  Result Value Ref Range Status   Specimen Description  BLOOD BLOOD LEFT FOREARM  Final   Special Requests   Final    BOTTLES DRAWN AEROBIC AND ANAEROBIC Blood Culture adequate volume   Culture   Final    NO GROWTH 2 DAYS Performed at [facility], [address] [place], [place] [address]    Report Status PENDING  Incomplete  Culture, blood (routine x 2)     Status: None (Preliminary result)   Collection Time: 09/30/17 12:58 AM  Result Value Ref Range Status   Specimen Description BLOOD RIGHT ARM  Final   Special Requests   Final    BOTTLES DRAWN AEROBIC AND ANAEROBIC Blood Culture adequate volume   Culture   Final    NO GROWTH 2 DAYS Performed at [facility], [address], [place], [place] [address]    Report Status PENDING  Incomplete  MRSA PCR Screening     Status: None   Collection Time: 09/30/17  1:47 AM  Result Value Ref Range Status   MRSA by PCR NEGATIVE NEGATIVE Final    Comment:        The GeneXpert MRSA Assay (FDA approved for NASAL specimens only), is one component of a comprehensive MRSA colonization surveillance program. It is not intended to diagnose MRSA infection nor to guide or monitor treatment for MRSA infections. Performed at [facility], [address] [place], [place] [address]   Studies: Ct Abdomen Pelvis Wo Contrast  Result Date: 09/30/2017 CLINICAL DATA:  Patient with nausea, vomiting and diarrhea. End-stage renal disease. EXAM: CT ABDOMEN AND PELVIS WITHOUT CONTRAST TECHNIQUE: Multidetector CT imaging of the abdomen and pelvis was performed following the standard protocol without IV contrast. COMPARISON:  CT abdomen pelvis 03/16/2016. FINDINGS: Lower chest: Normal heart size. Interval development of bilateral ground-glass opacities and interlobular septal thickening. Small bilateral pleural effusions. Hepatobiliary: Liver is normal in size and contour. Multiple stones within the gallbladder lumen. No gallbladder wall thickening. No intrahepatic or extrahepatic biliary  ductal dilatation. Pancreas: Unremarkable Spleen: Unremarkable Adrenals/Urinary Tract: Adrenal glands are normal. Bilateral perinephric fat stranding. Kidneys are symmetric in size. No hydronephrosis. Urinary bladder is unremarkable. Stomach/Bowel: Mild wall thickening of the rectum. Re demonstrated mild wall thickening of the intrathoracic stomach. No evidence for bowel obstruction. No free intraperitoneal air. Large hiatal hernia. Vascular/Lymphatic: Normal caliber abdominal aorta. Peripheral calcified atherosclerotic plaque. Interval increase in size of retroperitoneal adenopathy with a reference 1.6 cm aortocaval lymph node (image 24; series 2), previously measuring 1.2 cm. Left periaortic lymph node measures 1.8 cm (image 26; series 2), previously 1.3 cm. Left periaortic lymph node measures 1.8 cm (image 36; series 2), previously 0.9 cm. Reference paraceliac node measures 1.4 cm (image 20; series 2), previously 0.9 cm. Reproductive: Prostate is enlarged. Other: Small bilateral fat containing inguinal hernias, left-greater-than-right. Musculoskeletal: Lumbar spine degenerative changes. No aggressive or acute appearing osseous lesions. IMPRESSION: 1. Interval increase in size of bulky retroperitoneal and upper abdominal adenopathy concerning for the possibility of lymphoma or metastatic disease. 2. Re demonstrated nonspecific mild wall thickening of the intrathoracic stomach. Large hiatal hernia. 3. Interval development of bilateral lower lobe ground-glass opacities concerning for the possibility of edema. Small bilateral pleural effusions. 4. Mild wall thickening of the rectum.  Colitis not excluded. Electronically Signed   By: Lovey Newcomer M.D.   On: 09/30/2017 00:21   Dg Chest Port 1 View  Result Date: 10/01/2017 CLINICAL DATA:  Respiratory failure.  Myocardial infarction. EXAM: PORTABLE CHEST 1 VIEW COMPARISON:  09/30/2017 FINDINGS: Stable cardiac enlargement. Stable to slightly improved interstitial  edema. No significant pleural effusions identified. No pneumothorax. IMPRESSION: Stable to slightly improved pulmonary interstitial edema. Electronically Signed   By: Aletta Edouard M.D.   On: 10/01/2017 08:28   Dg Chest Port 1 View  Result Date: 09/30/2017 CLINICAL DATA:  Respiratory failure. Shortness of breath and chest pain EXAM: PORTABLE CHEST 1 VIEW COMPARISON:  09/30/2017 FINDINGS: Cardiomegaly and CABG changes again noted. Bilateral interstitial and airspace opacities have slightly improved. There is no evidence of pneumothorax. Hiatal hernia and probable trace effusions noted. No other significant change. IMPRESSION: Slightly improved bilateral interstitial/airspace opacities without other significant change. Electronically Signed   By: Margarette Canada M.D.   On: 09/30/2017 11:27   Dg Chest Port 1 View  Result Date: 09/30/2017 CLINICAL DATA:  Stage IV renal disease EXAM: PORTABLE CHEST 1 VIEW COMPARISON:  Chest radiograph 04/10/2009 FINDINGS: Monitoring leads overlie the patient. Stable cardiac and mediastinal contours. Large hiatal hernia. Diffuse bilateral interstitial pulmonary opacities. Small bilateral pleural effusions. IMPRESSION: Findings compatible with edema and small bilateral pleural effusions. Large hiatal hernia. Electronically Signed   By: Lovey Newcomer M.D.   On: 09/30/2017 00:46    Consults: Treatment Team:  Arta Silence, MD Isaias Cowman, MD Anthonette Legato, MD   Subjective:    Overnight Issues: patient did well yesterday and was transferred to the floor. Unfortunately after a dose of Coreg developed hypotension and was transferred back to the intensive care unit. Has received  multiple boluses of fluid may require briefly pressors.  Objective:  Vital signs for last 24 hours: Temp:  [96 F (35.6 C)-98.3 F (36.8 C)] 96 F (35.6 C) (08/20 1025) Pulse Rate:  [66-97] 84 (08/20 1007) Resp:  [17-23] 19 (08/20 0757) BP: (66-119)/(43-68) 66/43 (08/20  1025) SpO2:  [90 %-100 %] 94 % (08/20 1007) Weight:  [71.8 kg] 71.8 kg (08/20 0408)  Hemodynamic parameters for last 24 hours:    Intake/Output from previous day: 08/19 0701 - 08/20 0700 In: 881.3 [P.O.:120; I.V.:164.7; IV Piggyback:596.7] Out: 460 [Urine:450; Emesis/NG output:10]  Intake/Output this shift: No intake/output data recorded.  Vent settings for last 24 hours:    Physical Exam:  Vital signs: Please see the above listed vital signs HEENT: No oral lesions appreciated, trachea is midline, no thyromegaly appreciated, no accessory muscle utilization, no jugular venous distention Cardiovascular: Regular rate and rhythm Pulmonary: Clear to auscultation Abdominal: Soft exam Extremities: No clubbing cyanosis or edema is noted Neurologic: No clear focal deficits noted, moves all extremities  Assessment/Plan:   Hypotension. Was transferred back to the intensive care unit secondary to hypotension after receiving his Coreg today. Will give gentle fluid resuscitation, may need brief supporting of his hemodynamics with pressors. DC Coreg  Acute hypoxemic respiratory failure. No difficulties now presently on room air  Patient is being treated empirically for colitis with Unasyn. white count is 17.9 pro calcitonin was 4.11  Hyponatremia at 129. Hesitant to give more fluids secondary to recent flash pulmonary edema  Renal failure. Patient with stage IV renal disease, BUN is 57/creatinine 3.73. Sodium 128, potassium 5.3  NSTEMI. Patient with troponin greater than 65. Appreciate cardiology's assistance. Patient is on aspirin, heparin, statin, carvedilol, Avapro, Isordil.    Ray Lamb 10/02/2017  *Care during the described time interval was provided by me and/or other providers on the critical care team.  I have reviewed this patient's available data, including medical history, events of note, physical examination and test results as part of my evaluation. Patient ID: Ray Lamb, male   DOB: May 02, 1930, 84 y.o.   MRN: [id number]

## 2017-10-02 NOTE — Progress Notes (Signed)
Chaplain received page for RR. Chaplain went to patient room and care team was working with him. Chaplain made pastoral presence known. Nurse told Chaplain everything was ok and Chaplain said page if needed. Nurse thanked [employer]

## 2017-10-02 NOTE — Plan of Care (Signed)
  Problem: Clinical Measurements: Goal: Ability to maintain clinical measurements within normal limits will improve Outcome: Progressing Goal: Will remain free from infection Outcome: Progressing Goal: Diagnostic test results will improve Outcome: Progressing Goal: Respiratory complications will improve Outcome: Progressing   Problem: Clinical Measurements: Goal: Cardiovascular complication will be avoided Outcome: Not Met (add Reason) Note:  Pt hypotensive

## 2017-10-02 NOTE — Progress Notes (Signed)
[facility] Cardiology  SUBJECTIVE: Patient laying in bed, denies chest pain   Vitals:   10/01/17 1941 10/02/17 0408 10/02/17 0500 10/02/17 0757  BP: 119/68 (!) 92/56 (!) 110/56 111/65  Pulse: 93 97  92  Resp:  18  19  Temp: 98 F (36.7 C) 98.2 F (36.8 C)    TempSrc: Oral Oral    SpO2: 92% 95%  96%  Weight:  71.8 kg    Height:         Intake/Output Summary (Last 24 hours) at 10/02/2017 0810 Last data filed at 10/02/2017 0411 Gross per 24 hour  Intake 881.34 ml  Output 460 ml  Net 421.34 ml      PHYSICAL EXAM  General: Well developed, well nourished, in no acute distress HEENT:  Normocephalic and atramatic Neck:  No JVD.  Lungs: Clear bilaterally to auscultation and percussion. Heart: HRRR . Normal S1 and S2 without gallops or murmurs.  Abdomen: Bowel sounds are positive, abdomen soft and non-tender  Msk:  Back normal, normal gait. Normal strength and tone for age. Extremities: No clubbing, cyanosis or edema.   Neuro: Alert and oriented X 3. Psych:  Good affect, responds appropriately   LABS: Basic Metabolic Panel: Recent Labs    10/01/17 0434 10/02/17 0427  NA 129* 128*  K 4.3 5.3*  CL 99 98  CO2 20* 20*  GLUCOSE 171* 307*  BUN 50* 57*  CREATININE 2.91* 3.73*  CALCIUM 7.8* 8.0*  MG 2.2 2.4  PHOS 2.7 4.3   Liver Function Tests: Recent Labs    09/29/17 2201  AST 34  ALT 26  ALKPHOS 83  BILITOT 0.6  PROT 7.9  ALBUMIN 3.2*   Recent Labs    09/29/17 2201  LIPASE 45   CBC: Recent Labs    10/01/17 0434 10/02/17 0427  WBC 17.9* 12.8*  HGB 9.6* 9.2*  HCT 28.6* 27.1*  MCV 91.6 93.1  PLT 289 286   Cardiac Enzymes: Recent Labs    09/30/17 0847 09/30/17 1326 09/30/17 1808  TROPONINI >65.00* >65.00* >65.00*   BNP: Invalid input(s): POCBNP D-Dimer: No results for input(s): DDIMER in the last 72 hours. Hemoglobin A1C: No results for input(s): HGBA1C in the last 72 hours. Fasting Lipid Panel: No results for input(s): CHOL, HDL, LDLCALC,  TRIG, CHOLHDL, LDLDIRECT in the last 72 hours. Thyroid Function Tests: No results for input(s): TSH, T4TOTAL, T3FREE, THYROIDAB in the last 72 hours.  Invalid input(s): FREET3 Anemia Panel: No results for input(s): VITAMINB12, FOLATE, FERRITIN, TIBC, IRON, RETICCTPCT in the last 72 hours.  Dg Chest Port 1 View  Result Date: 10/01/2017 CLINICAL DATA:  Respiratory failure.  Myocardial infarction. EXAM: PORTABLE CHEST 1 VIEW COMPARISON:  09/30/2017 FINDINGS: Stable cardiac enlargement. Stable to slightly improved interstitial edema. No significant pleural effusions identified. No pneumothorax. IMPRESSION: Stable to slightly improved pulmonary interstitial edema. Electronically Signed   By: Aletta Edouard M.D.   On: 10/01/2017 08:28   Dg Chest Port 1 View  Result Date: 09/30/2017 CLINICAL DATA:  Respiratory failure. Shortness of breath and chest pain EXAM: PORTABLE CHEST 1 VIEW COMPARISON:  09/30/2017 FINDINGS: Cardiomegaly and CABG changes again noted. Bilateral interstitial and airspace opacities have slightly improved. There is no evidence of pneumothorax. Hiatal hernia and probable trace effusions noted. No other significant change. IMPRESSION: Slightly improved bilateral interstitial/airspace opacities without other significant change. Electronically Signed   By: Margarette Canada M.D.   On: 09/30/2017 11:27     Echo EF 35 to 40%  TELEMETRY: Sinus  rhythm:  ASSESSMENT AND PLAN:  Active Problems:   Acute hypoxemic respiratory failure ([place])    1.  Non-ST elevation myocardial infarction, no recurrent recurrent chest pain 2.  Ischemic cardiomyopathy 3.  CAD, status post CABG 2003 4.  Stage IV chronic kidney disease, worsening renal function 5.  Contrast dye allergy  Recommendations  1.  Conservative management per patient, and patient's wife's wishes 2.  Continue heparin drip for a total of 48 to 72 hours 3.  Nephrology consult   Isaias Cowman, MD, PhD, [facility] 10/02/2017 8:10  AM

## 2017-10-02 NOTE — Progress Notes (Signed)
ANTICOAGULATION CONSULT NOTE - Initial Consult  Pharmacy Consult for Heparin drip Indication: Troponin > 65   Allergies  Allergen Reactions  . Bee Venom Hives  . Iodinated Diagnostic Agents Rash  . Metrizamide Rash    Patient Measurements: Height: 5\' 6"  (167.6 cm) Weight: 158 lb 6.4 oz (71.8 kg) IBW/kg (Calculated) : 63.8 Heparin Dosing Weight: 71.4 kg  Vital Signs: Temp: 96 F (35.6 C) (08/20 1025) Temp Source: Axillary (08/20 1025) BP: 88/50 (08/20 1220) Pulse Rate: 73 (08/20 1220)  Labs: Recent Labs    09/30/17 0709 09/30/17 0819 09/30/17 0847 09/30/17 1023 09/30/17 1326  09/30/17 1808 10/01/17 0434 10/01/17 1223 10/02/17 0427 10/02/17 1355  HGB  --  10.8*  --   --   --   --   --  9.6*  --  9.2*  --   HCT  --  31.8*  --   --   --   --   --  28.6*  --  27.1*  --   PLT  --  280  --   --   --   --   --  289  --  286  --   APTT  --   --   --  29  --   --   --   --   --   --   --   LABPROT  --   --   --  13.9  --   --   --   --   --   --   --   INR  --   --   --  1.08  --   --   --   --   --   --   --   HEPARINUNFRC  --   --   --   --   --    < > 1.26* 0.51 0.36 0.27* 0.59  CREATININE 2.28*  --   --   --   --   --   --  2.91*  --  3.73*  --   TROPONINI >65.00*  --  >65.00*  --  >65.00*  --  >65.00*  --   --   --   --    < > = values in this interval not displayed.    Estimated Creatinine Clearance: 13.1 mL/min (A) (by C-G formula based on SCr of 3.73 mg/dL (H)).   Medical History: Past Medical History:  Diagnosis Date  . Anemia   . Chronic kidney disease    STAGE 4  . Coronary atherosclerosis   . Diabetes mellitus without complication ([place])   . Edema    ankle  . GERD (gastroesophageal reflux disease)   . Glaucoma   . Hearing loss   . Heart murmur   . History of hiatal hernia   . Hyperlipidemia   . Hypertension   . Mini stroke ([place])   . Mitral insufficiency   . Myocardial infarction ([place])    mild, heart cath done no stents needed  . [facility]roke [facility]  2011   TIA    Assessment: 84 yo M to start Heparin drip for Troponin > 65 x 2.  Patient not on Anticoagulant medication PTA per Med Rec. Patient did receive Heparin 5000 units subcutaneously this am at 0631. Hgb 10.8, Plt 280  aptt 29  INR 1.08 Scr 2.28  Heparin infusing at 850 units/hr   Goal of Therapy:  Heparin level 0.3-0.7 units/ml Monitor platelets by anticoagulation protocol: Yes  Plan:  8/20 @ 1355 Heparin level therapeutic @ 0.59. Will continue current infusion rate of 800 units/hr. Recheck in 8 hours. CBC and HL with AM labs per protocol.   Pernell Dupre, PharmD, BCPS Clinical Pharmacist 10/02/2017 3:17 PM

## 2017-10-02 NOTE — Progress Notes (Signed)
Pharmacy Electrolyte Monitoring Consult:  Pharmacy consulted to assist in monitoring and replacing electrolytes in this 84 y.o. male admitted on 09/29/2017 with Emesis and Diarrhea   Labs:  Sodium (mmol/L)  Date Value  10/02/2017 128 (L)  09/04/2017 137   Potassium (mmol/L)  Date Value  10/02/2017 5.3 (H)   Magnesium (mg/dL)  Date Value  10/02/2017 2.4   Phosphorus (mg/dL)  Date Value  10/02/2017 4.3   Calcium (mg/dL)  Date Value  10/02/2017 8.0 (L)   Albumin (g/dL)  Date Value  09/29/2017 3.2 (L)  02/26/2017 3.9    Assessment/Plan:     No supplementation is warranted at this time.   Patient has not required supplementation for a few days. Will sign off at this time.     Pernell Dupre, PharmD, BCPS Clinical Pharmacist 10/02/2017 11:34 AM

## 2017-10-02 NOTE — Significant Event (Signed)
Pt transferred back to ICU room 17. Pt hooked up to monitor. Pt hypotensive with admitting BP 66/43 after administration of scheduled coreg. Fluid bolus infusing when arrived for hypotension. Pt is drowsy but responsive and answering questions appropriately. Report given at bedside by Caryl Pina, RN. See flowsheets for more details.

## 2017-10-02 NOTE — Progress Notes (Signed)
Patient noted with vomiting, IV to right wrist (infusing heparin) dislodged. Patient assisted to a sitting position, cool wash cloth placed on forehead, PRN zofran administered. Patient reported feeling much better, denied chest pain or stomach pain. Will continue to monitor and endorse.

## 2017-10-02 NOTE — Progress Notes (Signed)
[facility]  ROUNDING NOTE   Subjective:  Patient very well known to was as we follow him for outpatient chronic kidney disease stage IV. Most recently his EGFR as an outpatient was found to be 20. He has suffered a severe myocardial infarction this admission. Troponin still greater than 65. Cardiology asked [place] to see the patient in light of his chronic kidney disease. Cardiac catheterization has been discussed.   Objective:Marland Kitchen  Vital signs in last 24 hours:  Temp:  [96 F (35.6 C)-98.2 F (36.8 C)] 96 F (35.6 C) (08/20 1025) Pulse Rate:  [66-97] 73 (08/20 1220) Resp:  [14-23] 14 (08/20 1220) BP: (66-119)/(43-73) 88/50 (08/20 1220) SpO2:  [90 %-100 %] 100 % (08/20 1220) Weight:  [71.8 kg] 71.8 kg (08/20 0408)  Weight change:  Filed Weights   09/29/17 2152 09/30/17 0150 10/02/17 0408  Weight: 72.6 kg 71.4 kg 71.8 kg    Intake/Output: I/O last 3 completed shifts: In: 1001.3 [P.O.:240; I.V.:164.7; IV Piggyback:596.7] Out: 1010 [Urine:1000; Emesis/NG output:10]   Intake/Output this shift:  No intake/output data recorded.  Physical Exam: General: No acute distress  Head: Normocephalic, atraumatic. Moist oral mucosal membranes  Eyes: Anicteric  Neck: Supple, trachea midline  Lungs:  Clear to auscultation, normal effort  Heart: S1S2 no rubs  Abdomen:  Soft, nontender, bowel sounds present  Extremities: Trace peripheral edema.  Neurologic: Awake, alert, following commands  Skin: No lesions  Access: None    Basic Metabolic Panel: Recent Labs  Lab 09/29/17 2201 09/30/17 0058 09/30/17 0709 10/01/17 0434 10/02/17 0427  NA 126*  --  128* 129* 128*  K 4.8  --  5.2* 4.3 5.3*  CL 98  --  100 99 98  CO2 20*  --  19* 20* 20*  GLUCOSE 241*  --  289* 171* 307*  BUN 43*  --  46* 50* 57*  CREATININE 2.21*  --  2.28* 2.91* 3.73*  CALCIUM 8.2*  --  7.7* 7.8* 8.0*  MG  --  1.5*  --  2.2 2.4  PHOS  --  2.5  --  2.7 4.3    Liver Function Tests: Recent Labs   Lab 09/29/17 2201  AST 34  ALT 26  ALKPHOS 83  BILITOT 0.6  PROT 7.9  ALBUMIN 3.2*   Recent Labs  Lab 09/29/17 2201  LIPASE 45   No results for input(s): AMMONIA in the last 168 hours.  CBC: Recent Labs  Lab 09/29/17 2201 09/30/17 0819 10/01/17 0434 10/02/17 0427  WBC 13.0* 13.6* 17.9* 12.8*  HGB 9.7* 10.8* 9.6* 9.2*  HCT 27.8* 31.8* 28.6* 27.1*  MCV 91.2 92.7 91.6 93.1  PLT 281 280 289 286    Cardiac Enzymes: Recent Labs  Lab 09/30/17 0058 09/30/17 0709 09/30/17 0847 09/30/17 1326 09/30/17 1808  TROPONINI 0.26* >65.00* >65.00* >65.00* >65.00*    BNP: Invalid input(s): POCBNP  CBG: Recent Labs  Lab 10/01/17 1555 10/01/17 2110 10/02/17 0759 10/02/17 0949 10/02/17 1206  GLUCAP 180* 229* 309* 325* 242*    Microbiology: Results for orders placed or performed during the hospital encounter of 09/29/17  Culture, blood (routine x 2)     Status: None (Preliminary result)   Collection Time: 09/30/17 12:58 AM  Result Value Ref Range Status   Specimen Description BLOOD BLOOD LEFT FOREARM  Final   Special Requests   Final    BOTTLES DRAWN AEROBIC AND ANAEROBIC Blood Culture adequate volume   Culture   Final    NO GROWTH 2 DAYS  Performed at [facility], [address] [place], [place] [address]    Report Status PENDING  Incomplete  Culture, blood (routine x 2)     Status: None (Preliminary result)   Collection Time: 09/30/17 12:58 AM  Result Value Ref Range Status   Specimen Description BLOOD RIGHT ARM  Final   Special Requests   Final    BOTTLES DRAWN AEROBIC AND ANAEROBIC Blood Culture adequate volume   Culture   Final    NO GROWTH 2 DAYS Performed at [facility], [address], [place], [place] [address]    Report Status PENDING  Incomplete  MRSA PCR Screening     Status: None   Collection Time: 09/30/17  1:47 AM  Result Value Ref Range Status   MRSA by PCR NEGATIVE NEGATIVE Final    Comment:        The GeneXpert MRSA  Assay (FDA approved for NASAL specimens only), is one component of a comprehensive MRSA colonization surveillance program. It is not intended to diagnose MRSA infection nor to guide or monitor treatment for MRSA infections. Performed at [facility], [address] [place], [place] [address]     Coagulation Studies: Recent Labs    09/30/17 1023  LABPROT 13.9  INR 1.08    Urinalysis: No results for input(s): COLORURINE, LABSPEC, PHURINE, GLUCOSEU, HGBUR, BILIRUBINUR, KETONESUR, PROTEINUR, UROBILINOGEN, NITRITE, LEUKOCYTESUR in the last 72 hours.  Invalid input(s): APPERANCEUR    Imaging: Dg Chest Port 1 View  Result Date: 10/01/2017 CLINICAL DATA:  Respiratory failure.  Myocardial infarction. EXAM: PORTABLE CHEST 1 VIEW COMPARISON:  09/30/2017 FINDINGS: Stable cardiac enlargement. Stable to slightly improved interstitial edema. No significant pleural effusions identified. No pneumothorax. IMPRESSION: Stable to slightly improved pulmonary interstitial edema. Electronically Signed   By: Aletta Edouard M.D.   On: 10/01/2017 08:28     Medications:   . [START ON 10/03/2017] ampicillin-sulbactam (UNASYN) IV    . famotidine (PEPCID) IV Stopped (10/02/17 0552)  . heparin 800 Units/hr (10/02/17 6378)   . aspirin EC  81 mg Oral Daily  . atorvastatin  80 mg Oral QHS  . carvedilol  25 mg Oral BID  . donepezil  5 mg Oral QHS  . feeding supplement (NEPRO CARB STEADY)  237 mL Oral TID BM  . insulin aspart  0-5 Units Subcutaneous QHS  . insulin aspart  0-9 Units Subcutaneous TID WC  . isosorbide mononitrate  30 mg Oral Daily  . linagliptin  5 mg Oral Daily  . multivitamin with minerals  1 tablet Oral Daily  . timolol  1 drop Both Eyes Daily   acetaminophen **OR** acetaminophen, bisacodyl, gi cocktail, Melatonin, ondansetron **OR** ondansetron (ZOFRAN) IV, oxyCODONE-acetaminophen, senna-docusate  Assessment/ Plan:  84 y.o. male  with history of hypertension,diabetes,  hyperlipidemia, CAD, 4vCABG 2003, hypercholesterolemia, CKD , stroke, pulmonary hypertension, Alzheimer's mild dementia, GERD, severe MR  1.  Acute renal failure/chronic kidney disease stage IV.  Acute renal failure secondary to altered cardiorenal hemodynamics from ischemic cardiac myopathy and chronic kidney disease stage IV secondary to hypertension, diabetes mellitus type 2, and age. We had a long discussion today about proposed interventions from a cardiac perspective.  Cardiac catheterization is being considered.  The patient's current EGFR is low at 14.  However he's had rather significant myocardial infarction which could potentially lead to physical decline as there is a significant chance that his cardiomyopathy may worsen with deep acute ischemia.  Patient does understand the risk however that with cardiac  catheterization he could potentially become end-stage renal disease.  However there is a chance of this if his ischemic cardiomyopathy worsens as well.  As such the patient will discuss this further with Dr. Tomasita Crumble shows and if cardiac catheterization is elected will be on standbyToprol for renal placement therapyas the patient is agreeable to this if renal function worsens.  E are hesitant to administer the patient IV fluids at the moment given ischemic cardiomyopathy.    2.  Hypotension.  Likely related to worsening cardiomyopathy.  May need inotropes to help maintain pressure but deferred to cardiology.  3.  Hyperkalemia.  Serum potassium 5.3.  We will need to continue to monitor this closely.  4.  Anemia chronic kidney disease.  Hemoglobin 9.2.  However we will avoid Epogen at this time given myocardial infarction.  5.  Thanks for consultation.   LOS: 2 Bree Heinzelman 8/20/20192:42 PM

## 2017-10-02 NOTE — Progress Notes (Signed)
This RN called to room by pt.family with concerns of pt. LOC at approximately 0950. Pt found to be pale, diaphoretic, and minimally responsive upon arrival, VS (refer to flowsheets), and CBG obtained, RRT called to room, and pt placed originally on 5 L O2 via [place]. MD paged. Pt with low BP over night Blood pressure meds held per night shift, MD notified this morning prior to giving BP medications, pt given 25 mg Coreg with BP of 112/66, HR 91, irbesartan discontinued, and imdur held this morning. Pt transferred to Stepdown at approx. 10:30am.

## 2017-10-02 NOTE — Progress Notes (Signed)
After arrived on unit post rapid response transfer, patient's son questioned code status of DNR. He stated that patient had said while he did not want to be intubated and "on life support" the patient did want to receive chest compressions, medications, and shock if needed. Patient confirmed this. Notified Dr. Jefferson Fuel, who changed patient's orders to partal code: no intubation in light of this information. DNR bracelet cut off patient after MD changed order.

## 2017-10-02 NOTE — Progress Notes (Signed)
ANTICOAGULATION CONSULT NOTE - Initial Consult  Pharmacy Consult for Heparin drip Indication: Troponin > 65   Allergies  Allergen Reactions  . Bee Venom Hives  . Iodinated Diagnostic Agents Rash  . Metrizamide Rash    Patient Measurements: Height: 5\' 6"  (167.6 cm) Weight: 158 lb 6.4 oz (71.8 kg) IBW/kg (Calculated) : 63.8 Heparin Dosing Weight: 71.4 kg  Vital Signs: Temp: 98.2 F (36.8 C) (08/20 0408) Temp Source: Oral (08/20 0408) BP: 110/56 (08/20 0500) Pulse Rate: 97 (08/20 0408)  Labs: Recent Labs    09/30/17 0709 09/30/17 0819 09/30/17 0847 09/30/17 1023 09/30/17 1326  09/30/17 1808 10/01/17 0434 10/01/17 1223 10/02/17 0427  HGB  --  10.8*  --   --   --   --   --  9.6*  --  9.2*  HCT  --  31.8*  --   --   --   --   --  28.6*  --  27.1*  PLT  --  280  --   --   --   --   --  289  --  286  APTT  --   --   --  29  --   --   --   --   --   --   LABPROT  --   --   --  13.9  --   --   --   --   --   --   INR  --   --   --  1.08  --   --   --   --   --   --   HEPARINUNFRC  --   --   --   --   --    < > 1.26* 0.51 0.36 0.27*  CREATININE 2.28*  --   --   --   --   --   --  2.91*  --  3.73*  TROPONINI >65.00*  --  >65.00*  --  >65.00*  --  >65.00*  --   --   --    < > = values in this interval not displayed.    Estimated Creatinine Clearance: 13.1 mL/min (A) (by C-G formula based on SCr of 3.73 mg/dL (H)).   Medical History: Past Medical History:  Diagnosis Date  . Anemia   . Chronic kidney disease    STAGE 4  . Coronary atherosclerosis   . Diabetes mellitus without complication ([place])   . Edema    ankle  . GERD (gastroesophageal reflux disease)   . Glaucoma   . Hearing loss   . Heart murmur   . History of hiatal hernia   . Hyperlipidemia   . Hypertension   . Mini stroke ([place])   . Mitral insufficiency   . Myocardial infarction ([place])    mild, heart cath done no stents needed  . Stroke [facility]) 2011   TIA    Medications:  Scheduled:  . aspirin EC   81 mg Oral Daily  . atorvastatin  80 mg Oral QHS  . carvedilol  25 mg Oral BID  . donepezil  5 mg Oral QHS  . heparin  1,100 Units Intravenous Once  . insulin aspart  0-5 Units Subcutaneous QHS  . insulin aspart  0-9 Units Subcutaneous TID WC  . irbesartan  150 mg Oral Daily  . isosorbide mononitrate  30 mg Oral Daily  . linagliptin  5 mg Oral Daily  . multivitamin with minerals  1 tablet  Oral Daily  . timolol  1 drop Both Eyes Daily   Infusions:  . ampicillin-sulbactam (UNASYN) IV Stopped (10/01/17 2131)  . famotidine (PEPCID) IV 20 mg (10/02/17 0522)  . heparin 650 Units/hr (10/01/17 1005)    Assessment: 84 yo M to start Heparin drip for Troponin > 65 x 2.  Patient not on Anticoagulant medication PTA per Med Rec. Patient did receive Heparin 5000 units subcutaneously this am at 0631. Hgb 10.8, Plt 280  aptt 29  INR 1.08 Scr 2.28  Heparin infusing at 850 units/hr  08/19 @ 0430 HL 0.51- Therapeutic x 1   Goal of Therapy:  Heparin level 0.3-0.7 units/ml Monitor platelets by anticoagulation protocol: Yes   Plan:  08/19 1223 HL 0.36. Level is therapeutic x 2 now. Will Check next HL with AM labs CBC ordered with AM labs per protocol.   8/20 AM heparin level 0.27. 1100 unit bolus and increase rate to 800 units/hr. Recheck in 8 hours.  Eloise Harman, PharmD, BCPS Clinical Pharmacist 10/02/2017 5:46 AM

## 2017-10-02 NOTE — Progress Notes (Signed)
ANTICOAGULATION CONSULT NOTE - Initial Consult  Pharmacy Consult for Heparin drip Indication: Troponin > 65   Allergies  Allergen Reactions  . Bee Venom Hives  . Iodinated Diagnostic Agents Rash  . Metrizamide Rash    Patient Measurements: Height: 5\' 6"  (167.6 cm) Weight: 158 lb 6.4 oz (71.8 kg) IBW/kg (Calculated) : 63.8 Heparin Dosing Weight: 71.4 kg  Vital Signs: Temp: 97.7 F (36.5 C) (08/20 2000) Temp Source: Oral (08/20 2000) BP: 82/48 (08/20 2200) Pulse Rate: 65 (08/20 2200)  Labs: Recent Labs    09/30/17 0709  09/30/17 0819 09/30/17 0847 09/30/17 1023 09/30/17 1326  09/30/17 1808 10/01/17 0434  10/02/17 0427 10/02/17 1355 10/02/17 2236  HGB  --    < > 10.8*  --   --   --   --   --  9.6*  --  9.2*  --   --   HCT  --   --  31.8*  --   --   --   --   --  28.6*  --  27.1*  --   --   PLT  --   --  280  --   --   --   --   --  289  --  286  --   --   APTT  --   --   --   --  29  --   --   --   --   --   --   --   --   LABPROT  --   --   --   --  13.9  --   --   --   --   --   --   --   --   INR  --   --   --   --  1.08  --   --   --   --   --   --   --   --   HEPARINUNFRC  --   --   --   --   --   --    < > 1.26* 0.51   < > 0.27* 0.59 0.47  CREATININE 2.28*  --   --   --   --   --   --   --  2.91*  --  3.73*  --   --   TROPONINI >65.00*  --   --  >65.00*  --  >65.00*  --  >65.00*  --   --   --   --   --    < > = values in this interval not displayed.    Estimated Creatinine Clearance: 13.1 mL/min (A) (by C-G formula based on SCr of 3.73 mg/dL (H)).   Medical History: Past Medical History:  Diagnosis Date  . Anemia   . Chronic kidney disease    STAGE 4  . Coronary atherosclerosis   . Diabetes mellitus without complication ([place])   . Edema    ankle  . GERD (gastroesophageal reflux disease)   . Glaucoma   . Hearing loss   . Heart murmur   . History of hiatal hernia   . Hyperlipidemia   . Hypertension   . Mini stroke ([place])   . Mitral insufficiency    . Myocardial infarction ([place])    mild, heart cath done no stents needed  . Stroke [facility]) 2011   TIA    Assessment: 84 yo M to start Heparin drip for Troponin >  65 x 2.  Patient not on Anticoagulant medication PTA per Med Rec. Patient did receive Heparin 5000 units subcutaneously this am at 0631. Hgb 10.8, Plt 280  aptt 29  INR 1.08 Scr 2.28  Heparin infusing at 850 units/hr   Goal of Therapy:  Heparin level 0.3-0.7 units/ml Monitor platelets by anticoagulation protocol: Yes   Plan:  8/20 @ 1355 Heparin level therapeutic @ 0.59. Will continue current infusion rate of 800 units/hr. Recheck in 8 hours. CBC and HL with AM labs per protocol.   8/20 2230 heparin level 0.47. Continue current regimen. Recheck heparin level and CBC with tomorrow AM labs.  Eloise Harman, PharmD, BCPS Clinical Pharmacist 10/02/2017 11:02 PM

## 2017-10-02 NOTE — Progress Notes (Signed)
Pharmacy Antibiotic Note  ZURICH CARRENO is a 84 y.o. male admitted on 09/29/2017 with intra-abdominal infection.  Pharmacy has been consulted for Unasyn dosing.  PCT: 4.11  Plan: Adjust dose to Unasyn 3g IV every 24 hours based on current CrCl <52ml/min  Height: 5\' 6"  (167.6 cm) Weight: 158 lb 6.4 oz (71.8 kg) IBW/kg (Calculated) : 63.8  Temp (24hrs), Avg:97.4 F (36.3 C), Min:96 F (35.6 C), Max:98.2 F (36.8 C)  Recent Labs  Lab 09/29/17 2201 09/30/17 0058 09/30/17 0416 09/30/17 0709 09/30/17 0819 10/01/17 0434 10/02/17 0427  WBC 13.0*  --   --   --  13.6* 17.9* 12.8*  CREATININE 2.21*  --   --  2.28*  --  2.91* 3.73*  LATICACIDVEN  --  2.3* 1.9  --   --   --   --     Estimated Creatinine Clearance: 13.1 mL/min (A) (by C-G formula based on SCr of 3.73 mg/dL (H)).    Allergies  Allergen Reactions  . Bee Venom Hives  . Iodinated Diagnostic Agents Rash  . Metrizamide Rash    Antimicrobials this admission: Zosyn 8/18 >>  8/19 8/19 Unasyn>>   Dose adjustments this admission: N/A  Microbiology results: 8/18 BCx: NG x 2 8/18 MRSA PCR: Negative  Thank you for allowing pharmacy to be a part of this patient's care.  Pernell Dupre, PharmD, BCPS Clinical Pharmacist 10/02/2017 12:15 PM

## 2017-10-02 NOTE — Progress Notes (Signed)
[facility] at [place] NAME: Ray Lamb    MR#:  [id number]  DATE OF BIRTH:  [birth year]  SUBJECTIVE:  CHIEF COMPLAINT:   Chief Complaint  Patient presents with  . Emesis  . Diarrhea  Rapid response called for low blood pressure after given Coreg, blood pressure in the 70s to 80s range, patient transferred to stepdown unit for further close medical monitoring, patient without pain/shortness of breath, son at the bedside, all questions answered, case discussed with intensivist  REVIEW OF SYSTEMS:  CONSTITUTIONAL: No fever, fatigue or weakness.  EYES: No blurred or double vision.  EARS, NOSE, AND THROAT: No tinnitus or ear pain.  RESPIRATORY: No cough, shortness of breath, wheezing or hemoptysis.  CARDIOVASCULAR: No chest pain, orthopnea, edema.  GASTROINTESTINAL: No nausea, vomiting, diarrhea or abdominal pain.  GENITOURINARY: No dysuria, hematuria.  ENDOCRINE: No polyuria, nocturia,  HEMATOLOGY: No anemia, easy bruising or bleeding SKIN: No rash or lesion. MUSCULOSKELETAL: No joint pain or arthritis.   NEUROLOGIC: No tingling, numbness, weakness.  PSYCHIATRY: No anxiety or depression.   ROS  DRUG ALLERGIES:   Allergies  Allergen Reactions  . Bee Venom Hives  . Iodinated Diagnostic Agents Rash  . Metrizamide Rash    VITALS:  Blood pressure (!) 88/50, pulse 73, temperature (!) 96 F (35.6 C), temperature source Axillary, resp. rate 14, height 5\' 6"  (1.676 m), weight 71.8 kg, SpO2 100 %.  PHYSICAL EXAMINATION:  GENERAL:  84 y.o.-year-old patient lying in the bed with no acute distress.  EYES: Pupils equal, round, reactive to light and accommodation. No scleral icterus. Extraocular muscles intact.  HEENT: Head atraumatic, normocephalic. Oropharynx and nasopharynx clear.  NECK:  Supple, no jugular venous distention. No thyroid enlargement, no tenderness.  LUNGS: Normal breath sounds bilaterally, no wheezing, rales,rhonchi or  crepitation. No use of accessory muscles of respiration.  CARDIOVASCULAR: S1, S2 normal. No murmurs, rubs, or gallops.  ABDOMEN: Soft, nontender, nondistended. Bowel sounds present. No organomegaly or mass.  EXTREMITIES: No pedal edema, cyanosis, or clubbing.  NEUROLOGIC: Cranial nerves II through XII are intact. Muscle strength 5/5 in all extremities. Sensation intact. Gait not checked.  PSYCHIATRIC: The patient is alert and oriented x 3.  SKIN: No obvious rash, lesion, or ulcer.   Physical Exam LABORATORY PANEL:   CBC Recent Labs  Lab 10/02/17 0427  WBC 12.8*  HGB 9.2*  HCT 27.1*  PLT 286   ------------------------------------------------------------------------------------------------------------------  Chemistries  Recent Labs  Lab 09/29/17 2201  10/02/17 0427  NA 126*   < > 128*  K 4.8   < > 5.3*  CL 98   < > 98  CO2 20*   < > 20*  GLUCOSE 241*   < > 307*  BUN 43*   < > 57*  CREATININE 2.21*   < > 3.73*  CALCIUM 8.2*   < > 8.0*  MG  --    < > 2.4  AST 34  --   --   ALT 26  --   --   ALKPHOS 83  --   --   BILITOT 0.6  --   --    < > = values in this interval not displayed.   ------------------------------------------------------------------------------------------------------------------  Cardiac Enzymes Recent Labs  Lab 09/30/17 1326 09/30/17 1808  TROPONINI >65.00* >65.00*   ------------------------------------------------------------------------------------------------------------------  RADIOLOGY:  Dg Chest Port 1 View  Result Date: 10/01/2017 CLINICAL DATA:  Respiratory failure.  Myocardial infarction. EXAM: PORTABLE CHEST 1 VIEW COMPARISON:  09/30/2017 FINDINGS: Stable cardiac enlargement. Stable to slightly improved interstitial edema. No significant pleural effusions identified. No pneumothorax. IMPRESSION: Stable to slightly improved pulmonary interstitial edema. Electronically Signed   By: Aletta Edouard M.D.   On: 10/01/2017 08:28     ASSESSMENT AND PLAN:  *Acute NSTEMI Stable Cardiology input appreciated-aggressive medical management given advanced age/dye allergy/chronic stage IV kidney disease, continue ACS protocol, aspirin, Plavix, heparin heparin drip for 48-72 hours, statin therapy, carvedilol, Avapro, Isordil, supplemental oxygen PRN, PRN nitrates, IV morphine for breakthrough pain  *Acute hypotension Most likely secondary to medication side effect-Coreg Rapid response called this morning, blood pressure in the 70s to 44B range systolic, given 201 cc normal saline solution bolus without improvement, patient transferred to stepdown unit for closer medical monitoring  *Acute hypoxic respiratory failure  Resolving  Currently on oxygen via nasal cannula, did require BiPAP   *Acute colitis  Resolving  Continuing Unasyn follow-up on outstanding cultures   *Acute renal failure   Continue worsening noted  Continue to avoid nephrotoxic agents, strict I&O monitoring, daily weights, BMP in the morning  *Acute hyponatremia Stable  Transfer back to stepdown unit for closer medical monitoring   All the records are reviewed and case discussed with Care Management/Social Workerr. Management plans discussed with the patient, family and they are in agreement.  CODE STATUS:partial  TOTAL TIME TAKING CARE OF THIS PATIENT: 40 minutes.     POSSIBLE D/C IN 2-5 DAYS, DEPENDING ON CLINICAL CONDITION.   Avel Peace Mckenleigh Tarlton M.D on 10/02/2017   Between 7am to 6pm - Pager - [phone]  After 6pm go to [email or web address] - password [email or web address] [facility]  Office  [phone]  CC: Primary care physician; [name], MD  Note: This dictation was prepared with Dragon dictation along with smaller phrase technology. Any transcriptional errors that result from this process are unintentional.

## 2017-10-02 NOTE — Progress Notes (Signed)
Pts systolic BP around 29-04B and means 60s. Dr.Conforti contacted regarding pts BP. Per Dr.Conforti, BP goal is systolic >53 and Means >39. Will continue to monitor.

## 2017-10-02 NOTE — Progress Notes (Signed)
Inpatient Diabetes Program Recommendations  AACE/ADA: New Consensus Statement on Inpatient Glycemic Control (2019)  Target Ranges:  Prepandial:   less than 140 mg/dL      Peak postprandial:   less than 180 mg/dL (1-2 hours)      Critically ill patients:  140 - 180 mg/dL  Results for Ray Lamb, Ray Lamb (MRN [id number]) as of 10/02/2017 08:43  Ref. Range 10/01/2017 07:59 10/01/2017 11:43 10/01/2017 15:55 10/01/2017 21:10 10/02/2017 07:59  Glucose-Capillary Latest Ref Range: 70 - 99 mg/dL 172 (H) 214 (H) 180 (H) 229 (H) 309 (H)    Review of Glycemic Control  Diabetes history: DM2 Outpatient Diabetes medications: Glipizide 5 mg daily, Januvia 25 mg daily Current orders for Inpatient glycemic control: Tradjenta 5 QD, Novolog 0-9 units TID with meals, Novolog 0-5 units QHS  Inpatient Diabetes Program Recommendations:  Insulin - Basal: Please consider ordering Lantus 7 units Q24H starting now (based on 71.8 kg x 0.1 units). Insulin - Meal Coverage: If post prandial glucose remains consistently greater than 180 mg/dl with addition of basal insulin, please consider ordering Novolog 2 units TID with meals for meal coverage if patient eats at least 50% of meals.  Thanks, Barnie Alderman, RN, MSN, CDE Diabetes Coordinator Inpatient Diabetes Program [phone] (Team Pager from 8am to 5pm)

## 2017-10-03 LAB — PREPARE RBC (CROSSMATCH)

## 2017-10-03 LAB — GLUCOSE, CAPILLARY
Glucose-Capillary: 161 mg/dL — ABNORMAL HIGH (ref 70–99)
Glucose-Capillary: 189 mg/dL — ABNORMAL HIGH (ref 70–99)
Glucose-Capillary: 298 mg/dL — ABNORMAL HIGH (ref 70–99)
Glucose-Capillary: 265 mg/dL — ABNORMAL HIGH (ref 70–99)
Glucose-Capillary: 309 mg/dL — ABNORMAL HIGH (ref 70–99)

## 2017-10-03 LAB — CBC
HEMATOCRIT: 24.2 % — AB (ref 40.0–52.0)
HEMOGLOBIN: 8.4 g/dL — AB (ref 13.0–18.0)
MCH: 31.7 pg (ref 26.0–34.0)
MCHC: 34.6 g/dL (ref 32.0–36.0)
MCV: 91.4 fL (ref 80.0–100.0)
Platelets: 273 10*3/uL (ref 150–440)
RBC: 2.64 MIL/uL — ABNORMAL LOW (ref 4.40–5.90)
RDW: 14.4 % (ref 11.5–14.5)
WBC: 13.2 10*3/uL — ABNORMAL HIGH (ref 3.8–10.6)

## 2017-10-03 LAB — BASIC METABOLIC PANEL
Anion gap: 8 (ref 5–15)
BUN: 67 mg/dL — AB (ref 8–23)
CHLORIDE: 99 mmol/L (ref 98–111)
CO2: 23 mmol/L (ref 22–32)
CREATININE: 4.72 mg/dL — AB (ref 0.61–1.24)
Calcium: 8.2 mg/dL — ABNORMAL LOW (ref 8.9–10.3)
GFR calc Af Amer: 12 mL/min — ABNORMAL LOW (ref >60)
GFR calc non Af Amer: 10 mL/min — ABNORMAL LOW (ref >60)
Glucose, Bld: 132 mg/dL — ABNORMAL HIGH (ref 70–99)
POTASSIUM: 4.3 mmol/L (ref 3.5–5.1)
Sodium: 130 mmol/L — ABNORMAL LOW (ref 135–145)

## 2017-10-03 LAB — ABO/RH: ABO/RH(D): A POS

## 2017-10-03 LAB — HEPARIN LEVEL (UNFRACTIONATED): Heparin Unfractionated: 0.36 IU/mL (ref 0.30–0.70)

## 2017-10-03 MED ORDER — INSULIN ASPART 100 UNIT/ML ~~LOC~~ SOLN
0.0000 [IU] | Freq: Every day | SUBCUTANEOUS | Status: DC
  Administered 2017-10-04: 5 [IU] via SUBCUTANEOUS
  Administered 2017-10-05: 3 [IU] via SUBCUTANEOUS
  Administered 2017-10-06 – 2017-10-07 (×2): 2 [IU] via SUBCUTANEOUS
  Administered 2017-10-08: 3 [IU] via SUBCUTANEOUS
  Administered 2017-10-10: 1 [IU] via SUBCUTANEOUS
  Administered 2017-10-11: 3 [IU] via SUBCUTANEOUS
  Administered 2017-10-12: 2 [IU] via SUBCUTANEOUS
  Filled 2017-10-03 (×9): qty 1

## 2017-10-03 MED ORDER — SODIUM CHLORIDE 0.9 % WEIGHT BASED INFUSION: 1.0000 mL/kg/h | INTRAVENOUS | Status: DC

## 2017-10-03 MED ORDER — SODIUM CHLORIDE 0.9 % WEIGHT BASED INFUSION: 3.0000 mL/kg/h | INTRAVENOUS | Status: AC

## 2017-10-03 MED ORDER — INSULIN ASPART 100 UNIT/ML ~~LOC~~ SOLN
0.0000 [IU] | Freq: Three times a day (TID) | SUBCUTANEOUS | Status: DC
  Administered 2017-10-04: 5 [IU] via SUBCUTANEOUS
  Administered 2017-10-04: 8 [IU] via SUBCUTANEOUS
  Administered 2017-10-05: 15 [IU] via SUBCUTANEOUS
  Administered 2017-10-05: 5 [IU] via SUBCUTANEOUS
  Administered 2017-10-05: 15 [IU] via SUBCUTANEOUS
  Administered 2017-10-06: 3 [IU] via SUBCUTANEOUS
  Administered 2017-10-06: 8 [IU] via SUBCUTANEOUS
  Administered 2017-10-06 – 2017-10-07 (×2): 5 [IU] via SUBCUTANEOUS
  Administered 2017-10-07 (×2): 8 [IU] via SUBCUTANEOUS
  Administered 2017-10-08: 11 [IU] via SUBCUTANEOUS
  Administered 2017-10-08: 8 [IU] via SUBCUTANEOUS
  Administered 2017-10-08: 15 [IU] via SUBCUTANEOUS
  Administered 2017-10-09: 5 [IU] via SUBCUTANEOUS
  Administered 2017-10-09: 2 [IU] via SUBCUTANEOUS
  Administered 2017-10-09: 8 [IU] via SUBCUTANEOUS
  Administered 2017-10-10: 2 [IU] via SUBCUTANEOUS
  Administered 2017-10-10 – 2017-10-11 (×2): 8 [IU] via SUBCUTANEOUS
  Administered 2017-10-11 (×2): 3 [IU] via SUBCUTANEOUS
  Administered 2017-10-12: 2 [IU] via SUBCUTANEOUS
  Administered 2017-10-12 – 2017-10-13 (×4): 8 [IU] via SUBCUTANEOUS
  Administered 2017-10-13: 5 [IU] via SUBCUTANEOUS
  Administered 2017-10-14: 3 [IU] via SUBCUTANEOUS
  Filled 2017-10-03 (×27): qty 1

## 2017-10-03 MED ORDER — ASPIRIN 81 MG PO CHEW
81.0000 mg | CHEWABLE_TABLET | ORAL | Status: AC
  Administered 2017-10-04: 81 mg via ORAL
  Filled 2017-10-03: qty 1

## 2017-10-03 MED ORDER — SODIUM CHLORIDE 0.9% IV SOLUTION
Freq: Once | INTRAVENOUS | Status: AC
  Administered 2017-10-03: 14:00:00 via INTRAVENOUS

## 2017-10-03 MED ORDER — SODIUM CHLORIDE 0.9% FLUSH
3.0000 mL | Freq: Two times a day (BID) | INTRAVENOUS | Status: DC
  Administered 2017-10-03: 3 mL via INTRAVENOUS

## 2017-10-03 MED ORDER — SODIUM CHLORIDE 0.9% FLUSH: 3.0000 mL | INTRAVENOUS | Status: DC | PRN

## 2017-10-03 MED ORDER — SODIUM CHLORIDE 0.9 % IV SOLN: 250.0000 mL | INTRAVENOUS | Status: DC | PRN

## 2017-10-03 MED ORDER — SODIUM CHLORIDE 0.9 % IV SOLN
INTRAVENOUS | Status: DC
  Administered 2017-10-03: 20 mL/h via INTRAVENOUS

## 2017-10-03 NOTE — Progress Notes (Signed)
ANTICOAGULATION CONSULT NOTE - Initial Consult  Pharmacy Consult for Heparin drip Indication: Troponin > 65   Allergies  Allergen Reactions  . Bee Venom Hives  . Iodinated Diagnostic Agents Rash  . Metrizamide Rash    Patient Measurements: Height: 5\' 6"  (167.6 cm) Weight: 158 lb 6.4 oz (71.8 kg) IBW/kg (Calculated) : 63.8 Heparin Dosing Weight: 71.4 kg  Vital Signs: Temp: 97.9 F (36.6 C) (08/21 0200) Temp Source: Oral (08/21 0200) BP: 98/47 (08/21 0400) Pulse Rate: 62 (08/21 0400)  Labs: Recent Labs    09/30/17 0847 09/30/17 1023 09/30/17 1326  09/30/17 1808 10/01/17 0434  10/02/17 0427 10/02/17 1355 10/02/17 2236 10/03/17 0441  HGB  --   --   --   --   --  9.6*  --  9.2*  --   --  8.4*  HCT  --   --   --   --   --  28.6*  --  27.1*  --   --  24.2*  PLT  --   --   --   --   --  289  --  286  --   --  273  APTT  --  29  --   --   --   --   --   --   --   --   --   LABPROT  --  13.9  --   --   --   --   --   --   --   --   --   INR  --  1.08  --   --   --   --   --   --   --   --   --   HEPARINUNFRC  --   --   --    < > 1.26* 0.51   < > 0.27* 0.59 0.47 0.36  CREATININE  --   --   --   --   --  2.91*  --  3.73*  --   --  4.72*  TROPONINI >65.00*  --  >65.00*  --  >65.00*  --   --   --   --   --   --    < > = values in this interval not displayed.    Estimated Creatinine Clearance: 10.3 mL/min (A) (by C-G formula based on SCr of 4.72 mg/dL (H)).   Medical History: Past Medical History:  Diagnosis Date  . Anemia   . Chronic kidney disease    STAGE 4  . Coronary atherosclerosis   . Diabetes mellitus without complication ([place])   . Edema    ankle  . GERD (gastroesophageal reflux disease)   . Glaucoma   . Hearing loss   . Heart murmur   . History of hiatal hernia   . Hyperlipidemia   . Hypertension   . Mini stroke ([place])   . Mitral insufficiency   . Myocardial infarction ([place])    mild, heart cath done no stents needed  . Stroke [facility]) 2011   TIA     Assessment: 84 yo M to start Heparin drip for Troponin > 65 x 2.  Patient not on Anticoagulant medication PTA per Med Rec. Patient did receive Heparin 5000 units subcutaneously this am at 0631. Hgb 10.8, Plt 280  aptt 29  INR 1.08 Scr 2.28  Heparin infusing at 850 units/hr   Goal of Therapy:  Heparin level 0.3-0.7 units/ml Monitor platelets by anticoagulation protocol: Yes  Plan:  8/20 @ 1355 Heparin level therapeutic @ 0.59. Will continue current infusion rate of 800 units/hr. Recheck in 8 hours. CBC and HL with AM labs per protocol.   8/20 2230 heparin level 0.47. Continue current regimen. Recheck heparin level and CBC with tomorrow AM labs.  8/21 AM heparin level 0.36. Continue current regimen. Recheck heparin level and CBC with tomorrow AM labs.  Eloise Harman, PharmD, BCPS Clinical Pharmacist 10/03/2017 5:46 AM

## 2017-10-03 NOTE — Care Management (Addendum)
Message sent to Elvera Bicker with Patient pathways regarding Long-term plan for peritoneal dialysis. PPD requested from ICU RN.

## 2017-10-03 NOTE — Progress Notes (Addendum)
[facility] at [place] NAME: Ray Lamb    MR#:  [id number]  DATE OF BIRTH:  12-08-1930  SUBJECTIVE:  CHIEF COMPLAINT:   Chief Complaint  Patient presents with  . Emesis  . Diarrhea   She has no complaints.  Blood pressure is low side but stable. REVIEW OF SYSTEMS:  CONSTITUTIONAL: No fever, fatigue or weakness.  EYES: No blurred or double vision.  EARS, NOSE, AND THROAT: No tinnitus or ear pain.  RESPIRATORY: No cough, shortness of breath, wheezing or hemoptysis.  CARDIOVASCULAR: No chest pain, orthopnea, edema.  GASTROINTESTINAL: No nausea, vomiting, diarrhea or abdominal pain.  GENITOURINARY: No dysuria, hematuria.  ENDOCRINE: No polyuria, nocturia,  HEMATOLOGY: No anemia, easy bruising or bleeding SKIN: No rash or lesion. MUSCULOSKELETAL: No joint pain or arthritis.   NEUROLOGIC: No tingling, numbness, weakness.  PSYCHIATRY: No anxiety or depression.   ROS  DRUG ALLERGIES:   Allergies  Allergen Reactions  . Bee Venom Hives  . Iodinated Diagnostic Agents Rash  . Metrizamide Rash    VITALS:  Blood pressure (!) 110/52, pulse 84, temperature 97.6 F (36.4 C), temperature source Oral, resp. rate 17, height 5\' 6"  (1.676 m), weight 71.6 kg, SpO2 100 %.  PHYSICAL EXAMINATION:  GENERAL:  84 y.o.-year-old patient lying in the bed with no acute distress.  EYES: Pupils equal, round, reactive to light and accommodation. No scleral icterus. Extraocular muscles intact.  HEENT: Head atraumatic, normocephalic. Oropharynx and nasopharynx clear.  NECK:  Supple, no jugular venous distention. No thyroid enlargement, no tenderness.  LUNGS: Normal breath sounds bilaterally, no wheezing, rales,rhonchi or crepitation. No use of accessory muscles of respiration.  CARDIOVASCULAR: S1, S2 normal. No murmurs, rubs, or gallops.  ABDOMEN: Soft, nontender, nondistended. Bowel sounds present. No organomegaly or mass.  EXTREMITIES: No pedal edema, cyanosis,  or clubbing.  NEUROLOGIC: Cranial nerves II through XII are intact. Muscle strength 4/5 in all extremities. Sensation intact. Gait not checked.  PSYCHIATRIC: The patient is alert and oriented x 3.  SKIN: No obvious rash, lesion, or ulcer.   Physical Exam LABORATORY PANEL:   CBC Recent Labs  Lab 10/03/17 0441  WBC 13.2*  HGB 8.4*  HCT 24.2*  PLT 273   ------------------------------------------------------------------------------------------------------------------  Chemistries  Recent Labs  Lab 09/29/17 2201  10/02/17 0427 10/03/17 0441  NA 126*   < > 128* 130*  K 4.8   < > 5.3* 4.3  CL 98   < > 98 99  CO2 20*   < > 20* 23  GLUCOSE 241*   < > 307* 132*  BUN 43*   < > 57* 67*  CREATININE 2.21*   < > 3.73* 4.72*  CALCIUM 8.2*   < > 8.0* 8.2*  MG  --    < > 2.4  --   AST 34  --   --   --   ALT 26  --   --   --   ALKPHOS 83  --   --   --   BILITOT 0.6  --   --   --    < > = values in this interval not displayed.   ------------------------------------------------------------------------------------------------------------------  Cardiac Enzymes Recent Labs  Lab 09/30/17 1326 09/30/17 1808  TROPONINI >65.00* >65.00*   ------------------------------------------------------------------------------------------------------------------  RADIOLOGY:  No results found.  ASSESSMENT AND PLAN:  *Acute NSTEMI Cardiology input appreciated-aggressive medical management given advanced age/dye allergy/chronic stage IV kidney disease, continue ACS protocol, aspirin, Plavix, heparin heparin drip for  48-72 hours, statin therapy, hold carvedilol, Avapro, Isordil, continue supplemental oxygen, PRN nitrates, IV morphine for breakthrough pain. Cardiac cath tomorrow per Dr. Saralyn Pilar.   *Acute hypotension On Levophed drip.  Hold hypertension medication.  *Acute hypoxic respiratory failure due to above. Currently on oxygen via nasal cannula,  BiPAP prn. Duneb prn.  *Acute colitis  with leukocytosis. Continuing Unasyn.  Leukocytosis is improving.  *Acute renal failure on CKD stage 4, ATN due to hypotension.  Worsening. Continue to avoid nephrotoxic agents, strict I&O monitoring, daily weights,  Start hemodialysis after cardiac cath tomorrow and follow-up BMP per Dr. Holley Raring.  *Acute hyponatremia Stable.   All the records are reviewed and case discussed with Care Management/Social Workerr. Management plans discussed with the patient, his son and grandson and they are in agreement.  CODE STATUS:partial  TOTAL TIME TAKING CARE OF THIS PATIENT: 38 minutes.   POSSIBLE D/C IN 3 DAYS, DEPENDING ON CLINICAL CONDITION.   Demetrios Loll M.D on 10/03/2017   Between 7am to 6pm - Pager - [phone]  After 6pm go to [email or web address] - password [email or web address] [facility]  Office  [phone]  CC: Primary care physician; [name], MD  Note: This dictation was prepared with Dragon dictation along with smaller phrase technology. Any transcriptional errors that result from this process are unintentional.

## 2017-10-03 NOTE — Progress Notes (Signed)
[facility]  SUBJECTIVE: Patient laying in bed, denies chest pain   Vitals:   10/03/17 0300 10/03/17 0400 10/03/17 0500 10/03/17 0600  BP: (!) 94/47 (!) 98/47 106/87 (!) 102/54  Pulse: (!) 59 62 75 66  Resp: 16 20 19 20   Temp:      TempSrc:      SpO2: 100% 100% 97% 99%  Weight:   71.6 kg   Height:         Intake/Output Summary (Last 24 hours) at 10/03/2017 0758 Last data filed at 10/03/2017 0200 Gross per 24 hour  Intake 480 ml  Output 100 ml  Net 380 ml      PHYSICAL EXAM  General: Well developed, well nourished, in no acute distress HEENT:  Normocephalic and atramatic Neck:  No JVD.  Lungs: Clear bilaterally to auscultation and percussion. Heart: HRRR . Normal S1 and S2 without gallops or murmurs.  Abdomen: Bowel sounds are positive, abdomen soft and non-tender  Msk:  Back normal, normal gait. Normal strength and tone for age. Extremities: No clubbing, cyanosis or edema.   Neuro: Alert and oriented X 3. Psych:  Good affect, responds appropriately   LABS: Basic Metabolic Panel: Recent Labs    10/01/17 0434 10/02/17 0427 10/03/17 0441  NA 129* 128* 130*  K 4.3 5.3* 4.3  CL 99 98 99  CO2 20* 20* 23  GLUCOSE 171* 307* 132*  BUN 50* 57* 67*  CREATININE 2.91* 3.73* 4.72*  CALCIUM 7.8* 8.0* 8.2*  MG 2.2 2.4  --   PHOS 2.7 4.3  --    Liver Function Tests: No results for input(s): AST, ALT, ALKPHOS, BILITOT, PROT, ALBUMIN in the last 72 hours. No results for input(s): LIPASE, AMYLASE in the last 72 hours. CBC: Recent Labs    10/02/17 0427 10/03/17 0441  WBC 12.8* 13.2*  HGB 9.2* 8.4*  HCT 27.1* 24.2*  MCV 93.1 91.4  PLT 286 273   Cardiac Enzymes: Recent Labs    09/30/17 0847 09/30/17 1326 09/30/17 1808  TROPONINI >65.00* >65.00* >65.00*   BNP: Invalid input(s): POCBNP D-Dimer: No results for input(s): DDIMER in the last 72 hours. Hemoglobin A1C: No results for input(s): HGBA1C in the last 72 hours. Fasting Lipid Panel: No results for  input(s): CHOL, HDL, LDLCALC, TRIG, CHOLHDL, LDLDIRECT in the last 72 hours. Thyroid Function Tests: No results for input(s): TSH, T4TOTAL, T3FREE, THYROIDAB in the last 72 hours.  Invalid input(s): FREET3 Anemia Panel: No results for input(s): VITAMINB12, FOLATE, FERRITIN, TIBC, IRON, RETICCTPCT in the last 72 hours.  No results found.   Echo LVEF 35 to 40%  TELEMETRY: Sinus rhythm:  ASSESSMENT AND PLAN:  Active Problems:   Acute hypoxemic respiratory failure (HCC)    1.  Non-ST elevation myocardial infarction, no recurrent chest pain 2.  Ischemic cardiomyopathy 3.  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  Stage IV chronic kidney disease, worsening renal function, and pending dialysis 5.  Contrast dye allergy 6.  Anemia  Recommendations  1.  Agree with current therapy 2.  DC heparin drip 3.  Transfuse 1 unit PRBC 4.  Cardiac catheterization with selective coronary arteriography scheduled for/22/2019.  The risks, benefits alternatives of cardiac catheterization and possible PCI were explained to the patient and informed consent was obtained.   Isaias Cowman, MD, PhD, [facility] 10/03/2017 7:58 AM

## 2017-10-03 NOTE — Progress Notes (Signed)
Inpatient Diabetes Program Recommendations  AACE/ADA: New Consensus Statement on Inpatient Glycemic Control (2019)  Target Ranges:  Prepandial:   less than 140 mg/dL      Peak postprandial:   less than 180 mg/dL (1-2 hours)      Critically ill patients:  140 - 180 mg/dL   Results for KAMIR, SELOVER (MRN [id number]) as of 10/03/2017 11:00  Ref. Range 10/02/2017 07:59 10/02/2017 09:49 10/02/2017 12:06 10/02/2017 16:40 10/02/2017 21:21 10/03/2017 07:45 10/03/2017 08:58  Glucose-Capillary Latest Ref Range: 70 - 99 mg/dL 309 (H) 325 (H) 242 (H) 263 (H) 248 (H) 161 (H) 189 (H)   Review of Glycemic Control  Diabetes history: DM2 Outpatient Diabetes medications: Glipizide 5 mg daily, Januvia 25 mg daily Current orders for Inpatient glycemic control: Tradjenta 5 QD, Novolog 0-9 units TID with meals, Novolog 0-5 units QHS  Inpatient Diabetes Program Recommendations:  Insulin - Meal Coverage: Please consider ordering Novolog 3 units TID with meals for meal coverage if patient eats at least 50% of meals.  NOTE: In reviewing chart, noted patient will have cardiac cath tomorrow and plan for hemodialysis following cath. Postprandial glucose is consistently elevated. Anticipate patient will benefit from Novolog meal coverage to cover carbohydrates.  Thanks, Barnie Alderman, RN, MSN, CDE Diabetes Coordinator Inpatient Diabetes Program [phone] (Team Pager from 8am to 5pm)

## 2017-10-03 NOTE — Progress Notes (Signed)
[facility]  ROUNDING NOTE   Subjective:  Patient moved over to the critical care unit for hypotension yesterday. Currently awake and alert and following commands. Renal function worse as EGFR is now down to 10. We had a long discussion today again about the potential for renal replacement therapy particularly in light of cardiac catheterization at his upcoming tomorrow. Patient is agreed to perform hemodialysis. Long-term plan for peritoneal dialysis however.   Objective:Marland Kitchen  Vital signs in last 24 hours:  Temp:  [96 F (35.6 C)-97.9 F (36.6 C)] 97.9 F (36.6 C) (08/21 0200) Pulse Rate:  [58-86] 79 (08/21 0900) Resp:  [14-29] 24 (08/21 0900) BP: (66-116)/(42-87) 116/53 (08/21 0900) SpO2:  [90 %-100 %] 100 % (08/21 0900) Weight:  [71.6 kg] 71.6 kg (08/21 0500)  Weight change: -0.25 kg Filed Weights   09/30/17 0150 10/02/17 0408 10/03/17 0500  Weight: 71.4 kg 71.8 kg 71.6 kg    Intake/Output: I/O last 3 completed shifts: In: 626 [P.O.:600; I.V.:26] Out: 110 [Urine:100; Emesis/NG output:10]   Intake/Output this shift:  No intake/output data recorded.  Physical Exam: General: No acute distress  Head: Normocephalic, atraumatic. Moist oral mucosal membranes  Eyes: Anicteric  Neck: Supple, trachea midline  Lungs:  Clear to auscultation, normal effort  Heart: S1S2 no rubs  Abdomen:  Soft, nontender, bowel sounds present  Extremities: Trace peripheral edema.  Neurologic: Awake, alert, following commands  Skin: No lesions  Access: None    Basic Metabolic Panel: Recent Labs  Lab 09/29/17 2201 09/30/17 0058 09/30/17 0709 10/01/17 0434 10/02/17 0427 10/03/17 0441  NA 126*  --  128* 129* 128* 130*  K 4.8  --  5.2* 4.3 5.3* 4.3  CL 98  --  100 99 98 99  CO2 20*  --  19* 20* 20* 23  GLUCOSE 241*  --  289* 171* 307* 132*  BUN 43*  --  46* 50* 57* 67*  CREATININE 2.21*  --  2.28* 2.91* 3.73* 4.72*  CALCIUM 8.2*  --  7.7* 7.8* 8.0* 8.2*  MG  --  1.5*   --  2.2 2.4  --   PHOS  --  2.5  --  2.7 4.3  --     Liver Function Tests: Recent Labs  Lab 09/29/17 2201  AST 34  ALT 26  ALKPHOS 83  BILITOT 0.6  PROT 7.9  ALBUMIN 3.2*   Recent Labs  Lab 09/29/17 2201  LIPASE 45   No results for input(s): AMMONIA in the last 168 hours.  CBC: Recent Labs  Lab 09/29/17 2201 09/30/17 0819 10/01/17 0434 10/02/17 0427 10/03/17 0441  WBC 13.0* 13.6* 17.9* 12.8* 13.2*  HGB 9.7* 10.8* 9.6* 9.2* 8.4*  HCT 27.8* 31.8* 28.6* 27.1* 24.2*  MCV 91.2 92.7 91.6 93.1 91.4  PLT 281 280 289 286 273    Cardiac Enzymes: Recent Labs  Lab 09/30/17 0058 09/30/17 0709 09/30/17 0847 09/30/17 1326 09/30/17 1808  TROPONINI 0.26* >65.00* >65.00* >65.00* >65.00*    BNP: Invalid input(s): POCBNP  CBG: Recent Labs  Lab 10/02/17 1206 10/02/17 1640 10/02/17 2121 10/03/17 0745 10/03/17 0858  GLUCAP 242* 263* 248* 161* 189*    Microbiology: Results for orders placed or performed during the hospital encounter of 09/29/17  Culture, blood (routine x 2)     Status: None (Preliminary result)   Collection Time: 09/30/17 12:58 AM  Result Value Ref Range Status   Specimen Description BLOOD BLOOD LEFT FOREARM  Final   Special Requests   Final  BOTTLES DRAWN AEROBIC AND ANAEROBIC Blood Culture adequate volume   Culture   Final    NO GROWTH 3 DAYS Performed at [facility], [address] [place], [place] [address]    Report Status PENDING  Incomplete  Culture, blood (routine x 2)     Status: None (Preliminary result)   Collection Time: 09/30/17 12:58 AM  Result Value Ref Range Status   Specimen Description BLOOD RIGHT ARM  Final   Special Requests   Final    BOTTLES DRAWN AEROBIC AND ANAEROBIC Blood Culture adequate volume   Culture   Final    NO GROWTH 3 DAYS Performed at [facility], [address], [place], [place] [address]    Report Status PENDING  Incomplete  MRSA PCR Screening     Status: None   Collection  Time: 09/30/17  1:47 AM  Result Value Ref Range Status   MRSA by PCR NEGATIVE NEGATIVE Final    Comment:        The GeneXpert MRSA Assay (FDA approved for NASAL specimens only), is one component of a comprehensive MRSA colonization surveillance program. It is not intended to diagnose MRSA infection nor to guide or monitor treatment for MRSA infections. Performed at [facility], [address] [place], [place] [address]     Coagulation Studies: Recent Labs    09/30/17 1023  LABPROT 13.9  INR 1.08    Urinalysis: No results for input(s): COLORURINE, LABSPEC, PHURINE, GLUCOSEU, HGBUR, BILIRUBINUR, KETONESUR, PROTEINUR, UROBILINOGEN, NITRITE, LEUKOCYTESUR in the last 72 hours.  Invalid input(s): APPERANCEUR    Imaging: No results found.   Medications:   . ampicillin-sulbactam (UNASYN) IV    . heparin 800 Units/hr (10/02/17 2029)  . norepinephrine (LEVOPHED) Adult infusion     . sodium chloride   Intravenous Once  . aspirin EC  81 mg Oral Daily  . atorvastatin  80 mg Oral QHS  . carvedilol  25 mg Oral BID  . donepezil  5 mg Oral QHS  . famotidine  20 mg Oral Daily  . feeding supplement (NEPRO CARB STEADY)  237 mL Oral TID BM  . insulin aspart  0-5 Units Subcutaneous QHS  . insulin aspart  0-9 Units Subcutaneous TID WC  . isosorbide mononitrate  30 mg Oral Daily  . linagliptin  5 mg Oral Daily  . multivitamin with minerals  1 tablet Oral Daily  . timolol  1 drop Both Eyes Daily   acetaminophen **OR** acetaminophen, bisacodyl, gi cocktail, Melatonin, ondansetron **OR** ondansetron (ZOFRAN) IV, senna-docusate  Assessment/ Plan:  84 y.o. male  with history of hypertension,diabetes, hyperlipidemia, CAD, 4vCABG 2003, hypercholesterolemia, CKD , stroke, pulmonary hypertension, Alzheimer's mild dementia, GERD, severe MR  1.  Acute renal failure/chronic kidney disease stage IV.  Patient has agreed to having cardiac catheterization performed.  His renal  function has deteriorated from yesterday likely secondary to hypotension.  We will proceed with vascular surgery consult with PermCath placement and then start the patient on dialysis tomorrow after cardiac catheterization.  2.  Hypotension.  Blood pressure better at the moment.  Not requiring pressors.  3.  Hyperkalemia.  Calcium down to 4.3.  Continue to monitor closely.  4.  Anemia chronic kidney disease.  Hemoglobin currently down to 8.4.  Would off on Epogen given recent myocardial infarction.   LOS: 3 Deston Bilyeu 8/21/20199:29 AM

## 2017-10-03 NOTE — Progress Notes (Signed)
Follow up - Critical Care Medicine Note  Patient Details:    Ray Lamb is an 84 y.o. male. with chronic stage IV renal disease, type 2 diabetes, coronary artery disease, prior history of CVA presented to the emergency department with complaint of nausea, vomiting and diarrhea. He also relates to me now that he had associated chest pain.He was noted to have a high potassium in the outpatient setting, was most likely given Kayexalate and had diarrhea. He was admitted to the floor, received fluid resuscitation and went into pulmonary edema and was subsequently transferred to the intensive care unit. Initial troponin was 0.06 and follow up troponin was greater than 65. Repeat EKG was not significantly changed. Was seen by cardiology and felt secondary to renal failure patient was not an ideal PCI candidate and being treated medically.  Lines, Airways, Drains:    Anti-infectives:  Anti-infectives (From admission, onward)   Start     Dose/Rate Route Frequency Ordered Stop   10/03/17 1000  Ampicillin-Sulbactam (UNASYN) 3 g in sodium chloride 0.9 % 100 mL IVPB     3 g 200 mL/hr over 30 Minutes Intravenous Every 24 hours 10/02/17 1217     10/01/17 1000  Ampicillin-Sulbactam (UNASYN) 3 g in sodium chloride 0.9 % 100 mL IVPB  Status:  Discontinued     3 g 200 mL/hr over 30 Minutes Intravenous Every 12 hours 10/01/17 0808 10/02/17 1217   09/30/17 1200  piperacillin-tazobactam (ZOSYN) IVPB 3.375 g  Status:  Discontinued     3.375 g 12.5 mL/hr over 240 Minutes Intravenous Every 12 hours 09/30/17 0720 09/30/17 0836   09/30/17 1000  piperacillin-tazobactam (ZOSYN) IVPB 3.375 g  Status:  Discontinued     3.375 g 12.5 mL/hr over 240 Minutes Intravenous Every 8 hours 09/30/17 0836 10/01/17 0808   09/30/17 0030  piperacillin-tazobactam (ZOSYN) IVPB 3.375 g     3.375 g 100 mL/hr over 30 Minutes Intravenous  Once 09/30/17 0029 09/30/17 0129      Microbiology: Results for orders placed or performed during  the hospital encounter of 09/29/17  Culture, blood (routine x 2)     Status: None (Preliminary result)   Collection Time: 09/30/17 12:58 AM  Result Value Ref Range Status   Specimen Description BLOOD BLOOD LEFT FOREARM  Final   Special Requests   Final    BOTTLES DRAWN AEROBIC AND ANAEROBIC Blood Culture adequate volume   Culture   Final    NO GROWTH 3 DAYS Performed at [facility], [address], [place], [place] [address]    Report Status PENDING  Incomplete  Culture, blood (routine x 2)     Status: None (Preliminary result)   Collection Time: 09/30/17 12:58 AM  Result Value Ref Range Status   Specimen Description BLOOD RIGHT ARM  Final   Special Requests   Final    BOTTLES DRAWN AEROBIC AND ANAEROBIC Blood Culture adequate volume   Culture   Final    NO GROWTH 3 DAYS Performed at [facility], [address], [place], [place] [address]    Report Status PENDING  Incomplete  MRSA PCR Screening     Status: None   Collection Time: 09/30/17  1:47 AM  Result Value Ref Range Status   MRSA by PCR NEGATIVE NEGATIVE Final    Comment:        The GeneXpert MRSA Assay (FDA approved for NASAL specimens only), is one component of a comprehensive MRSA colonization surveillance program. It is not intended  to diagnose MRSA infection nor to guide or monitor treatment for MRSA infections. Performed at [facility], [address], [place], [place] [address]   Studies: Ct Abdomen Pelvis Wo Contrast  Result Date: 09/30/2017 CLINICAL DATA:  Patient with nausea, vomiting and diarrhea. End-stage renal disease. EXAM: CT ABDOMEN AND PELVIS WITHOUT CONTRAST TECHNIQUE: Multidetector CT imaging of the abdomen and pelvis was performed following the standard protocol without IV contrast. COMPARISON:  CT abdomen pelvis 03/16/2016. FINDINGS: Lower chest: Normal heart size. Interval development of bilateral ground-glass opacities and interlobular septal thickening. Small  bilateral pleural effusions. Hepatobiliary: Liver is normal in size and contour. Multiple stones within the gallbladder lumen. No gallbladder wall thickening. No intrahepatic or extrahepatic biliary ductal dilatation. Pancreas: Unremarkable Spleen: Unremarkable Adrenals/Urinary Tract: Adrenal glands are normal. Bilateral perinephric fat stranding. Kidneys are symmetric in size. No hydronephrosis. Urinary bladder is unremarkable. Stomach/Bowel: Mild wall thickening of the rectum. Re demonstrated mild wall thickening of the intrathoracic stomach. No evidence for bowel obstruction. No free intraperitoneal air. Large hiatal hernia. Vascular/Lymphatic: Normal caliber abdominal aorta. Peripheral calcified atherosclerotic plaque. Interval increase in size of retroperitoneal adenopathy with a reference 1.6 cm aortocaval lymph node (image 24; series 2), previously measuring 1.2 cm. Left periaortic lymph node measures 1.8 cm (image 26; series 2), previously 1.3 cm. Left periaortic lymph node measures 1.8 cm (image 36; series 2), previously 0.9 cm. Reference paraceliac node measures 1.4 cm (image 20; series 2), previously 0.9 cm. Reproductive: Prostate is enlarged. Other: Small bilateral fat containing inguinal hernias, left-greater-than-right. Musculoskeletal: Lumbar spine degenerative changes. No aggressive or acute appearing osseous lesions. IMPRESSION: 1. Interval increase in size of bulky retroperitoneal and upper abdominal adenopathy concerning for the possibility of lymphoma or metastatic disease. 2. Re demonstrated nonspecific mild wall thickening of the intrathoracic stomach. Large hiatal hernia. 3. Interval development of bilateral lower lobe ground-glass opacities concerning for the possibility of edema. Small bilateral pleural effusions. 4. Mild wall thickening of the rectum.  Colitis not excluded. Electronically Signed   By: Lovey Newcomer M.D.   On: 09/30/2017 00:21   Dg Chest Port 1 View  Result Date:  10/01/2017 CLINICAL DATA:  Respiratory failure.  Myocardial infarction. EXAM: PORTABLE CHEST 1 VIEW COMPARISON:  09/30/2017 FINDINGS: Stable cardiac enlargement. Stable to slightly improved interstitial edema. No significant pleural effusions identified. No pneumothorax. IMPRESSION: Stable to slightly improved pulmonary interstitial edema. Electronically Signed   By: Aletta Edouard M.D.   On: 10/01/2017 08:28   Dg Chest Port 1 View  Result Date: 09/30/2017 CLINICAL DATA:  Respiratory failure. Shortness of breath and chest pain EXAM: PORTABLE CHEST 1 VIEW COMPARISON:  09/30/2017 FINDINGS: Cardiomegaly and CABG changes again noted. Bilateral interstitial and airspace opacities have slightly improved. There is no evidence of pneumothorax. Hiatal hernia and probable trace effusions noted. No other significant change. IMPRESSION: Slightly improved bilateral interstitial/airspace opacities without other significant change. Electronically Signed   By: Margarette Canada M.D.   On: 09/30/2017 11:27   Dg Chest Port 1 View  Result Date: 09/30/2017 CLINICAL DATA:  Stage IV renal disease EXAM: PORTABLE CHEST 1 VIEW COMPARISON:  Chest radiograph 04/10/2009 FINDINGS: Monitoring leads overlie the patient. Stable cardiac and mediastinal contours. Large hiatal hernia. Diffuse bilateral interstitial pulmonary opacities. Small bilateral pleural effusions. IMPRESSION: Findings compatible with edema and small bilateral pleural effusions. Large hiatal hernia. Electronically Signed   By: Lovey Newcomer M.D.   On: 09/30/2017 00:46    Consults: Treatment Team:  Arta Silence, MD Isaias Cowman, MD  Holley Raring Munsoor, MD   Subjective:    Overnight Issues: patient's hypotension has resolved. Awake and alert in no acute distress eating breakfast this morning with normal blood pressure  Objective:  Vital signs for last 24 hours: Temp:  [96 F (35.6 C)-97.9 F (36.6 C)] 97.9 F (36.6 C) (08/21 0200) Pulse Rate:   [58-86] 66 (08/21 0800) Resp:  [14-29] 16 (08/21 0800) BP: (66-110)/(42-87) 110/60 (08/21 0800) SpO2:  [90 %-100 %] 100 % (08/21 0800) Weight:  [71.6 kg] 71.6 kg (08/21 0500)  Hemodynamic parameters for last 24 hours:    Intake/Output from previous day: 08/20 0701 - 08/21 0700 In: 480 [P.O.:480] Out: 100 [Urine:100]  Intake/Output this shift: No intake/output data recorded.  Vent settings for last 24 hours:    Physical Exam:  Vital signs: Please see the above listed vital signs HEENT: No oral lesions appreciated, trachea is midline, no thyromegaly appreciated, no accessory muscle utilization, no jugular venous distention Cardiovascular: Regular rate and rhythm Pulmonary: Clear to auscultation Abdominal: Soft exam Extremities: No clubbing cyanosis or edema is noted Neurologic: No clear focal deficits noted, moves all extremities  Assessment/Plan:   Hypotension. Resolved. Discussed with cardiology, patient is going to have a cardiac catheterization tomorrow  Acute hypoxemic respiratory failure. No difficulties now presently on room air  Hyponatremia at 130. Hesitant to give more fluids secondary to recent flash pulmonary edema  Renal failure. Patient with stage IV renal disease, BUN is 67;4.72. Appreciate nephrology assistance. Post cardiac catheterization patient may require temporary dialysis  NSTEMI. Patient with troponin greater than 65. Appreciate cardiology's assistance. Patient is on aspirin, heparin, statin, carvedilol, Avapro, Isordil.    Ray Lamb 10/03/2017  *Care during the described time interval was provided by me and/or other providers on the critical care team.  I have reviewed this patient's available data, including medical history, events of note, physical examination and test results as part of my evaluation. Patient ID: Ray Lamb, male   DOB: 03-11-1930, 84 y.o.   MRN: [id number] Patient ID: Ray Lamb, male   DOB: June 10, 1930, 84 y.o.   MRN:  [id number]

## 2017-10-03 NOTE — Progress Notes (Signed)
Pharmacy Antibiotic Note  Ray Lamb is a 84 y.o. male admitted on 09/29/2017 with intra-abdominal infection.  Pharmacy has been consulted for Unasyn dosing.  PCT: 4.11  Plan: Continue Unasyn 3g IV every 24 hours based on current CrCl <52ml/min  Height: 5\' 6"  (167.6 cm) Weight: 157 lb 13.6 oz (71.6 kg) IBW/kg (Calculated) : 63.8  Temp (24hrs), Avg:97.8 F (36.6 C), Min:97.7 F (36.5 C), Max:97.9 F (36.6 C)  Recent Labs  Lab 09/29/17 2201 09/30/17 0058 09/30/17 0416 09/30/17 0709 09/30/17 0819 10/01/17 0434 10/02/17 0427 10/03/17 0441  WBC 13.0*  --   --   --  13.6* 17.9* 12.8* 13.2*  CREATININE 2.21*  --   --  2.28*  --  2.91* 3.73* 4.72*  LATICACIDVEN  --  2.3* 1.9  --   --   --   --   --     Estimated Creatinine Clearance: 10.3 mL/min (A) (by C-G formula based on SCr of 4.72 mg/dL (H)).    Allergies  Allergen Reactions  . Bee Venom Hives  . Iodinated Diagnostic Agents Rash  . Metrizamide Rash    Antimicrobials this admission: Zosyn 8/18 >>  8/19 8/19 Unasyn>>   Dose adjustments this admission: N/A  Microbiology results: 8/18 BCx: NG x 3 8/18 MRSA PCR: Negative  Thank you for allowing pharmacy to be a part of this patient's care.  Pernell Dupre, PharmD, BCPS Clinical Pharmacist 10/03/2017 2:05 PM

## 2017-10-03 NOTE — Progress Notes (Signed)
Initial Nutrition Assessment  DOCUMENTATION CODES:   Not applicable  INTERVENTION:  Continue Nepro Shake po TID, each supplement provides 425 kcal and 19 grams protein. Patient prefers vanilla or strawberry.  Encouraged adequate intake of calories and protein at meals. Encouraged intake of HBV protein at meals. Encouraged patient to continue low sodium diet.  Once patient transitions to chronic dialysis, recommend discontinuing regular MVI and providing Rena-vite QHS.  NUTRITION DIAGNOSIS:   Inadequate oral intake related to decreased appetite, other (see comment)(hiatal hernia) as evidenced by per patient/family report.  GOAL:   Patient will meet greater than or equal to 90% of their needs  MONITOR:   PO intake, Supplement acceptance, Diet advancement, Labs, Weight trends, I & O's  REASON FOR ASSESSMENT:   Consult Poor PO  ASSESSMENT:   84 year old male with PMHx of Alzheimer's mild dementia, coronary atherosclerosis, HLD, anemia, mitral insufficiency, GERD, glaucoma, hx MI, hx CABG x4 2003,  HTN, hiatal hernia, DM, hx CVA 2011, CKD stage IV, hearing loss admitted with hypotension now resolved, acute hypoxic respiratory failure now resolved, hyponatremia, acute renal failure on CKD stage IV, NSTEMI.   -Cardiac catheterization scheduled for 8/22. -Plan is for placement of PermCath so patient can start on dialysis tomorrow after cardiac catheterization. Long term plan is for PD.  Met with patient and his family members at bedside. He reports that yesterday he did not eat very well because he was given a pain medication that made him very sleepy. Today he is feeling better and reports finishing most of his full liquid breakfast this AM. His appetite has been declining PTA, though. He reports a large hiatal hernia that is impacting his appetite and ability to eat. He has been choosing softer foods lately. For breakfast he usually has oatmeal with fruit, a piece of toast with peanut  butter and jelly, 4 prunes, and coffee. He has a late lunch around 3pm and may have a tomato sandwich with ham and mayonnaise, no salt chips, 4 grapes, and a handful of raw baby carrots. He usually eats 2 popsicles at 8pm before bed. Occasionally they go out for dinner and he may have fish or [place]. He has been following a low salt diet for a while now and feels comfortable with that. He enjoys the Nepro. He ordered a case of butter pecan Nepro for home, so here he would prefer to drink vanilla or mixed berry.   Patient reports his UBW was 170 lbs (77.3 kg). Per chart he was 77.1 kg on 02/14/2017 and is currently 71.6 kg. He has lost 5.5 kg (7.1% body weight) over the past almost 8 months, which is not significant for time frame.  Medications reviewed and include: famotidine, Novolog 0-9 units TID, Novolog 0-5 units QHS, MVI daily, Unasyn.  Labs reviewed: CBG 161-263, Sodium 130 (trending up), BUN 67 (trending up), Creatinine 4.72 (trending up), eGFR 10 (trending down). On 8/20 Potassium was 5.3 but is now 4.3, Phosphorus 4.3 on 8/20.  I/O: 100 mL UOP overnight  Patient does not meet criteria for malnutrition at this time but is at risk for development of malnutrition.  Discussed with RN and on rounds.  NUTRITION - FOCUSED PHYSICAL EXAM:    Most Recent Value  Orbital Region  No depletion  Upper Arm Region  Mild depletion  Thoracic and Lumbar Region  No depletion  Buccal Region  No depletion  Temple Region  No depletion  Clavicle Bone Region  Mild depletion  Clavicle and Acromion  Bone Region  Mild depletion  Scapular Bone Region  Mild depletion  Dorsal Hand  Mild depletion  Patellar Region  Mild depletion  Anterior Thigh Region  Mild depletion  Posterior Calf Region  Mild depletion  Edema (RD Assessment)  None  Hair  Reviewed  Eyes  Reviewed  Mouth  Reviewed  Skin  Reviewed [ecchymosis bilateral upper extremities]  Nails  Reviewed     Diet Order:   Diet Order            Diet  full liquid Room service appropriate? Yes; Fluid consistency: Thin  Diet effective now              EDUCATION NEEDS:   Education needs have been addressed  Skin:  Skin Assessment: Reviewed RN Assessment(ecchymosis)  Last BM:  10/03/2017 - medium type 7  Height:   Ht Readings from Last 1 Encounters:  09/30/17 5' 6"  (1.676 m)    Weight:   Wt Readings from Last 1 Encounters:  10/03/17 71.6 kg    Ideal Body Weight:  64.5 kg  BMI:  Body mass index is 25.48 kg/m.  Estimated Nutritional Needs:   Kcal:  1755-2025 (MSJ x 1.3-1.5)  Protein:  85-105 grams (1.2-1.5 grams/kg)  Fluid:  UOP + 1 L  Willey Blade, MS, RD, LDN Office: [phone] Pager: [phone] After Hours/Weekend Pager: [phone]

## 2017-10-04 ENCOUNTER — Encounter
Admission: EM | Disposition: A | Discharge status: Skilled Nursing Facility | Payer: Self-pay | Source: Home / Self Care | Attending: Internal Medicine

## 2017-10-04 ENCOUNTER — Inpatient Hospital Stay
Admission: EM | Disposition: A | Discharge status: Skilled Nursing Facility | Payer: Self-pay | Source: Home / Self Care | Attending: Internal Medicine

## 2017-10-04 ENCOUNTER — Encounter: Payer: Self-pay | Admitting: *Deleted

## 2017-10-04 DIAGNOSIS — N186 End stage renal disease: Secondary | ICD-10-CM

## 2017-10-04 HISTORY — PX: DIALYSIS/PERMA CATHETER INSERTION: CATH118288

## 2017-10-04 HISTORY — PX: LEFT HEART CATH AND CORONARY ANGIOGRAPHY: CATH118249

## 2017-10-04 LAB — GLUCOSE, CAPILLARY
GLUCOSE-CAPILLARY: 224 mg/dL — AB (ref 70–99)
GLUCOSE-CAPILLARY: 240 mg/dL — AB (ref 70–99)
GLUCOSE-CAPILLARY: 268 mg/dL — AB (ref 70–99)
Glucose-Capillary: 200 mg/dL — ABNORMAL HIGH (ref 70–99)
Glucose-Capillary: 359 mg/dL — ABNORMAL HIGH (ref 70–99)

## 2017-10-04 LAB — PHOSPHORUS: Phosphorus: 4.5 mg/dL (ref 2.5–4.6)

## 2017-10-04 LAB — CBC
HCT: 28.8 % — ABNORMAL LOW (ref 40.0–52.0)
HEMOGLOBIN: 10 g/dL — AB (ref 13.0–18.0)
MCH: 31.3 pg (ref 26.0–34.0)
MCHC: 34.5 g/dL (ref 32.0–36.0)
MCV: 90.7 fL (ref 80.0–100.0)
PLATELETS: 315 10*3/uL (ref 150–440)
RBC: 3.18 MIL/uL — ABNORMAL LOW (ref 4.40–5.90)
RDW: 14.9 % — AB (ref 11.5–14.5)
WBC: 13.8 10*3/uL — ABNORMAL HIGH (ref 3.8–10.6)

## 2017-10-04 LAB — TYPE AND SCREEN
ABO/RH(D): A POS
ANTIBODY SCREEN: NEGATIVE
Unit division: 0

## 2017-10-04 LAB — BPAM RBC
Blood Product Expiration Date: 201908272359
ISSUE DATE / TIME: 201908211420
Unit Type and Rh: 9500

## 2017-10-04 LAB — POCT ACTIVATED CLOTTING TIME: Activated Clotting Time: 351 seconds

## 2017-10-04 LAB — HEPARIN LEVEL (UNFRACTIONATED): HEPARIN UNFRACTIONATED: 0.23 [IU]/mL — AB (ref 0.30–0.70)

## 2017-10-04 SURGERY — DIALYSIS/PERMA CATHETER INSERTION
Anesthesia: Moderate Sedation

## 2017-10-04 SURGERY — LEFT HEART CATH AND CORONARY ANGIOGRAPHY
Anesthesia: Moderate Sedation

## 2017-10-04 MED ORDER — CLOPIDOGREL BISULFATE 75 MG PO TABS
ORAL_TABLET | ORAL | Status: AC
  Filled 2017-10-04: qty 8

## 2017-10-04 MED ORDER — MIDAZOLAM HCL 2 MG/2ML IJ SOLN
INTRAMUSCULAR | Status: AC
  Filled 2017-10-04: qty 2

## 2017-10-04 MED ORDER — INSULIN ASPART 100 UNIT/ML ~~LOC~~ SOLN
3.0000 [IU] | Freq: Once | SUBCUTANEOUS | Status: AC
  Administered 2017-10-04: 3 [IU] via SUBCUTANEOUS
  Filled 2017-10-04: qty 1

## 2017-10-04 MED ORDER — MIDAZOLAM HCL 2 MG/2ML IJ SOLN
INTRAMUSCULAR | Status: DC | PRN
  Administered 2017-10-04: 0.5 mg via INTRAVENOUS

## 2017-10-04 MED ORDER — CLOPIDOGREL BISULFATE 75 MG PO TABS
ORAL_TABLET | ORAL | Status: DC | PRN
  Administered 2017-10-04: 600 mg via ORAL

## 2017-10-04 MED ORDER — BIVALIRUDIN TRIFLUOROACETATE 250 MG IV SOLR
INTRAVENOUS | Status: AC
  Filled 2017-10-04: qty 250

## 2017-10-04 MED ORDER — METHYLPREDNISOLONE SODIUM SUCC 125 MG IJ SOLR
INTRAMUSCULAR | Status: AC
  Administered 2017-10-04: 125 mg via INTRAVENOUS
  Filled 2017-10-04: qty 2

## 2017-10-04 MED ORDER — PENTAFLUOROPROP-TETRAFLUOROETH EX AERO
1.0000 "application " | INHALATION_SPRAY | CUTANEOUS | Status: DC | PRN
  Filled 2017-10-04: qty 30

## 2017-10-04 MED ORDER — SODIUM CHLORIDE 0.9 % IV SOLN
0.2500 mg/kg/h | INTRAVENOUS | Status: DC
  Filled 2017-10-04: qty 250

## 2017-10-04 MED ORDER — ACETAMINOPHEN 325 MG PO TABS: 650.0000 mg | ORAL_TABLET | ORAL | Status: DC | PRN

## 2017-10-04 MED ORDER — HEPARIN SODIUM (PORCINE) 1000 UNIT/ML DIALYSIS
1000.0000 [IU] | INTRAMUSCULAR | Status: DC | PRN
  Filled 2017-10-04: qty 1

## 2017-10-04 MED ORDER — FENTANYL CITRATE (PF) 100 MCG/2ML IJ SOLN
INTRAMUSCULAR | Status: AC
  Filled 2017-10-04: qty 2

## 2017-10-04 MED ORDER — SODIUM CHLORIDE 0.9 % IV SOLN: 100.0000 mL | INTRAVENOUS | Status: DC | PRN

## 2017-10-04 MED ORDER — FAMOTIDINE 20 MG PO TABS
40.0000 mg | ORAL_TABLET | Freq: Once | ORAL | Status: AC
  Administered 2017-10-04: 40 mg via ORAL

## 2017-10-04 MED ORDER — LIDOCAINE-PRILOCAINE 2.5-2.5 % EX CREA
1.0000 "application " | TOPICAL_CREAM | CUTANEOUS | Status: DC | PRN
  Filled 2017-10-04: qty 5

## 2017-10-04 MED ORDER — ALTEPLASE 2 MG IJ SOLR: 2.0000 mg | Freq: Once | INTRAMUSCULAR | Status: DC | PRN

## 2017-10-04 MED ORDER — DIPHENHYDRAMINE HCL 50 MG/ML IJ SOLN
INTRAMUSCULAR | Status: AC
  Administered 2017-10-04: 25 mg via INTRAVENOUS
  Filled 2017-10-04: qty 1

## 2017-10-04 MED ORDER — FENTANYL CITRATE (PF) 100 MCG/2ML IJ SOLN
INTRAMUSCULAR | Status: DC | PRN
  Administered 2017-10-04: 12.5 ug via INTRAVENOUS

## 2017-10-04 MED ORDER — IOPAMIDOL (ISOVUE-300) INJECTION 61%
INTRAVENOUS | Status: DC | PRN
  Administered 2017-10-04: 135 mL via INTRA_ARTERIAL

## 2017-10-04 MED ORDER — ONDANSETRON HCL 4 MG/2ML IJ SOLN: 4.0000 mg | Freq: Four times a day (QID) | INTRAMUSCULAR | Status: DC | PRN

## 2017-10-04 MED ORDER — SODIUM CHLORIDE 0.9 % WEIGHT BASED INFUSION
1.0000 mL/kg/h | INTRAVENOUS | Status: AC
  Administered 2017-10-04: 1 mL/kg/h via INTRAVENOUS

## 2017-10-04 MED ORDER — ASPIRIN 81 MG PO CHEW
CHEWABLE_TABLET | ORAL | Status: AC
  Filled 2017-10-04: qty 4

## 2017-10-04 MED ORDER — SODIUM CHLORIDE 0.9% FLUSH: 3.0000 mL | INTRAVENOUS | Status: DC | PRN

## 2017-10-04 MED ORDER — NITROGLYCERIN 5 MG/ML IV SOLN
INTRAVENOUS | Status: AC
  Filled 2017-10-04: qty 10

## 2017-10-04 MED ORDER — BIVALIRUDIN BOLUS VIA INFUSION - CUPID
INTRAVENOUS | Status: DC | PRN
  Administered 2017-10-04 (×2): 55.875 mg via INTRAVENOUS

## 2017-10-04 MED ORDER — LIDOCAINE HCL (PF) 1 % IJ SOLN
5.0000 mL | INTRAMUSCULAR | Status: DC | PRN
  Filled 2017-10-04: qty 5

## 2017-10-04 MED ORDER — FAMOTIDINE 20 MG PO TABS
ORAL_TABLET | ORAL | Status: AC
  Administered 2017-10-04: 40 mg via ORAL
  Filled 2017-10-04: qty 1

## 2017-10-04 MED ORDER — HEPARIN BOLUS VIA INFUSION
1100.0000 [IU] | Freq: Once | INTRAVENOUS | Status: DC
  Filled 2017-10-04: qty 1100

## 2017-10-04 MED ORDER — DIPHENHYDRAMINE HCL 50 MG/ML IJ SOLN
25.0000 mg | Freq: Once | INTRAMUSCULAR | Status: AC
  Administered 2017-10-04: 25 mg via INTRAVENOUS

## 2017-10-04 MED ORDER — SODIUM CHLORIDE 0.9 % IV SOLN
250.0000 mL | INTRAVENOUS | Status: DC | PRN
  Administered 2017-10-08: 250 mL via INTRAVENOUS

## 2017-10-04 MED ORDER — ASPIRIN 81 MG PO CHEW
CHEWABLE_TABLET | ORAL | Status: DC | PRN
  Administered 2017-10-04: 324 mg via ORAL

## 2017-10-04 MED ORDER — METHYLPREDNISOLONE SODIUM SUCC 125 MG IJ SOLR
125.0000 mg | Freq: Once | INTRAMUSCULAR | Status: AC
  Administered 2017-10-04: 125 mg via INTRAVENOUS

## 2017-10-04 MED ORDER — CLOPIDOGREL BISULFATE 75 MG PO TABS
75.0000 mg | ORAL_TABLET | Freq: Every day | ORAL | Status: DC
  Administered 2017-10-05 – 2017-10-14 (×9): 75 mg via ORAL
  Filled 2017-10-04 (×9): qty 1

## 2017-10-04 MED ORDER — SODIUM CHLORIDE 0.9 % IV SOLN: 0.2500 mg/kg/h | INTRAVENOUS | Status: DC

## 2017-10-04 MED ORDER — SODIUM CHLORIDE 0.9 % IV SOLN
INTRAVENOUS | Status: AC | PRN
  Administered 2017-10-04: 0.25 mg/kg/h via INTRAVENOUS

## 2017-10-04 MED ORDER — LABETALOL HCL 5 MG/ML IV SOLN: 10.0000 mg | INTRAVENOUS | Status: AC | PRN

## 2017-10-04 MED ORDER — SODIUM CHLORIDE 0.9% FLUSH
3.0000 mL | Freq: Two times a day (BID) | INTRAVENOUS | Status: DC
  Administered 2017-10-05 – 2017-10-14 (×16): 3 mL via INTRAVENOUS

## 2017-10-04 MED ORDER — CHLORHEXIDINE GLUCONATE CLOTH 2 % EX PADS
6.0000 | MEDICATED_PAD | Freq: Every day | CUTANEOUS | Status: DC
  Administered 2017-10-06 – 2017-10-08 (×3): 6 via TOPICAL

## 2017-10-04 MED ORDER — LIDOCAINE-EPINEPHRINE (PF) 1 %-1:200000 IJ SOLN
INTRAMUSCULAR | Status: AC
  Filled 2017-10-04: qty 30

## 2017-10-04 MED ORDER — HEPARIN SODIUM (PORCINE) 5000 UNIT/ML IJ SOLN
INTRAMUSCULAR | Status: AC
  Filled 2017-10-04: qty 2

## 2017-10-04 MED ORDER — DIPHENHYDRAMINE HCL 50 MG/ML IJ SOLN
12.5000 mg | Freq: Once | INTRAMUSCULAR | Status: DC
  Filled 2017-10-04: qty 1

## 2017-10-04 MED ORDER — ASPIRIN 81 MG PO CHEW
81.0000 mg | CHEWABLE_TABLET | Freq: Every day | ORAL | Status: DC
  Administered 2017-10-06 – 2017-10-07 (×2): 81 mg via ORAL
  Filled 2017-10-04 (×2): qty 1

## 2017-10-04 MED ORDER — HYDRALAZINE HCL 20 MG/ML IJ SOLN: 5.0000 mg | INTRAMUSCULAR | Status: AC | PRN

## 2017-10-04 MED ORDER — HEPARIN (PORCINE) IN NACL 1000-0.9 UT/500ML-% IV SOLN
INTRAVENOUS | Status: AC
  Filled 2017-10-04: qty 500

## 2017-10-04 SURGICAL SUPPLY — 18 items
BALLN TREK RX 2.25X12 (BALLOONS) ×2
BALLOON TREK RX 2.25X12 (BALLOONS) ×1 IMPLANT
CATH INFINITI 5 FR IM (CATHETERS) ×2 IMPLANT
CATH INFINITI 5 FR MPA2 (CATHETERS) ×2 IMPLANT
CATH INFINITI 5 FR RCB (CATHETERS) ×2 IMPLANT
CATH INFINITI 5FR JL4 (CATHETERS) ×2 IMPLANT
CATH INFINITI JR4 5F (CATHETERS) ×4 IMPLANT
CATH VISTA GUIDE 6FR JR4 SH (CATHETERS) ×2 IMPLANT
DEVICE CLOSURE MYNXGRIP 6/7F (Vascular Products) ×2 IMPLANT
DEVICE SAFEGUARD 24CM (GAUZE/BANDAGES/DRESSINGS) ×2 IMPLANT
KIT MANI 3VAL PERCEP (MISCELLANEOUS) ×2 IMPLANT
PACK CARDIAC CATH (CUSTOM PROCEDURE TRAY) ×2 IMPLANT
SHEATH AVANTI 5FR X 11CM (SHEATH) ×2 IMPLANT
SHEATH AVANTI 6FR X 11CM (SHEATH) ×2 IMPLANT
STENT SIERRA 2.50 X 12 MM (Permanent Stent) ×2 IMPLANT
WIRE ASAHI PROWATER 180CM (WIRE) ×4 IMPLANT
WIRE EMERALD 3MM-J .035X260CM (WIRE) ×2 IMPLANT
WIRE GUIDERIGHT .035X150 (WIRE) ×2 IMPLANT

## 2017-10-04 SURGICAL SUPPLY — 7 items
BIOPATCH RED 1 DISK 7.0 (GAUZE/BANDAGES/DRESSINGS) ×2 IMPLANT
CATH PALINDROME RT-P 15FX19CM (CATHETERS) ×2 IMPLANT
DERMABOND ADVANCED (GAUZE/BANDAGES/DRESSINGS) ×1
DERMABOND ADVANCED .7 DNX12 (GAUZE/BANDAGES/DRESSINGS) ×1 IMPLANT
PACK ANGIOGRAPHY (CUSTOM PROCEDURE TRAY) ×2 IMPLANT
SUT MNCRL AB 4-0 PS2 18 (SUTURE) ×2 IMPLANT
SUT PROLENE 0 CT 1 30 (SUTURE) ×2 IMPLANT

## 2017-10-04 NOTE — Progress Notes (Signed)
Post HD assessment    10/04/17 2321  Neurological  Level of Consciousness Responds to Voice  Orientation Level Oriented X4  Respiratory  Respiratory Pattern Regular;Unlabored  Chest Assessment Chest expansion symmetrical  Cardiac  ECG Monitor Yes  Cardiac Rhythm NSR  Vascular  R Radial Pulse +2  L Radial Pulse +2  Edema Generalized;Right lower extremity;Left lower extremity  Integumentary  Integumentary (WDL) X  Skin Color Appropriate for ethnicity  Musculoskeletal  Musculoskeletal (WDL) X  Generalized Weakness Yes  GU Assessment  Genitourinary (WDL) X  Genitourinary Symptoms  (HD)  Psychosocial  Psychosocial (WDL) WDL

## 2017-10-04 NOTE — Progress Notes (Signed)
PT transferred to Special Procedures. Report Given to RN. Pt is A&Ox4, Vital signs stable on Heparin drip 800 units/hr and NS 20 ml/hr. Heparin bolus held per Pharmacist for procedure. Will administered when pt returns to  ICU per Pharmacist.

## 2017-10-04 NOTE — H&P (Signed)
Patient with multiple ongoing issues and acute on chronic kidney disease now needs a dialysis access.  We have been asked to place this by the nephrologist.  This will be done today.  See his admission H&P from 8/18 for full details.

## 2017-10-04 NOTE — Progress Notes (Signed)
HD tx end    10/04/17 2314  Vital Signs  Pulse Rate 92  Pulse Rate Source Monitor  Resp 17  BP (!) 102/48  BP Location Right Arm  BP Method Automatic  Patient Position (if appropriate) Lying  Oxygen Therapy  SpO2 98 %  O2 Device Room Air  During Hemodialysis Assessment  Dialysis Fluid Bolus Normal Saline  Bolus Amount (mL) 250 mL  Intra-Hemodialysis Comments Tx completed

## 2017-10-04 NOTE — Progress Notes (Signed)
HD tx start    10/04/17 2134  Vital Signs  Pulse Rate 99  Pulse Rate Source Monitor  Resp 17  BP (!) 113/53  BP Location Right Arm  BP Method Automatic  Patient Position (if appropriate) Lying  Oxygen Therapy  SpO2 96 %  O2 Device Room Air  During Hemodialysis Assessment  Blood Flow Rate (mL/min) 150 mL/min  Arterial Pressure (mmHg) -60 mmHg  Venous Pressure (mmHg) 20 mmHg  Transmembrane Pressure (mmHg) 30 mmHg  Ultrafiltration Rate (mL/min) 330 mL/min  Dialysate Flow Rate (mL/min) 300 ml/min  Conductivity: Machine  13.8  HD Safety Checks Performed Yes  Dialysis Fluid Bolus Normal Saline  Bolus Amount (mL) 250 mL  Intra-Hemodialysis Comments Tx initiated

## 2017-10-04 NOTE — Progress Notes (Signed)
[facility] NAME: Ray Lamb    MR#:  [id number]  DATE OF BIRTH:  01-16-1931  SUBJECTIVE:  CHIEF COMPLAINT:   Chief Complaint  Patient presents with  . Emesis  . Diarrhea   Patient has no complaints.  Blood pressure is low side but stable.  Off Levophed drip.  Status post permacath placement this morning. REVIEW OF SYSTEMS:  CONSTITUTIONAL: No fever, fatigue or weakness.  EYES: No blurred or double vision.  EARS, NOSE, AND THROAT: No tinnitus or ear pain.  RESPIRATORY: No cough, shortness of breath, wheezing or hemoptysis.  CARDIOVASCULAR: No chest pain, orthopnea, edema.  GASTROINTESTINAL: No nausea, vomiting, diarrhea or abdominal pain.  GENITOURINARY: No dysuria, hematuria.  ENDOCRINE: No polyuria, nocturia,  HEMATOLOGY: No anemia, easy bruising or bleeding SKIN: No rash or lesion. MUSCULOSKELETAL: No joint pain or arthritis.   NEUROLOGIC: No tingling, numbness, weakness.  PSYCHIATRY: No anxiety or depression.   ROS  DRUG ALLERGIES:   Allergies  Allergen Reactions  . Bee Venom Hives  . Iodinated Diagnostic Agents Rash  . Metrizamide Rash    VITALS:  Blood pressure (!) 100/49, pulse 88, temperature (!) 97.5 F (36.4 C), temperature source Oral, resp. rate 15, height 5\' 6"  (1.676 m), weight 74.5 kg, SpO2 99 %.  PHYSICAL EXAMINATION:  GENERAL:  84 y.o.-year-old patient lying in the bed with no acute distress.  EYES: Pupils equal, round, reactive to light and accommodation. No scleral icterus. Extraocular muscles intact.  HEENT: Head atraumatic, normocephalic. Oropharynx and nasopharynx clear.  NECK:  Supple, no jugular venous distention. No thyroid enlargement, no tenderness.  Perma cath in situ on the right side. LUNGS: Normal breath sounds bilaterally, no wheezing, rales,rhonchi or crepitation. No use of accessory muscles of respiration.  CARDIOVASCULAR: S1, S2 normal. No murmurs, rubs, or gallops.  ABDOMEN: Soft,  nontender, nondistended. Bowel sounds present. No organomegaly or mass.  EXTREMITIES: No pedal edema, cyanosis, or clubbing.  NEUROLOGIC: Cranial nerves II through XII are intact. Muscle strength 4/5 in all extremities. Sensation intact. Gait not checked.  PSYCHIATRIC: The patient is alert and oriented x 3.  SKIN: No obvious rash, lesion, or ulcer.   Physical Exam LABORATORY PANEL:   CBC Recent Labs  Lab 10/04/17 0511  WBC 13.8*  HGB 10.0*  HCT 28.8*  PLT 315   ------------------------------------------------------------------------------------------------------------------  Chemistries  Recent Labs  Lab 09/29/17 2201  10/02/17 0427 10/03/17 0441  NA 126*   < > 128* 130*  K 4.8   < > 5.3* 4.3  CL 98   < > 98 99  CO2 20*   < > 20* 23  GLUCOSE 241*   < > 307* 132*  BUN 43*   < > 57* 67*  CREATININE 2.21*   < > 3.73* 4.72*  CALCIUM 8.2*   < > 8.0* 8.2*  MG  --    < > 2.4  --   AST 34  --   --   --   ALT 26  --   --   --   ALKPHOS 83  --   --   --   BILITOT 0.6  --   --   --    < > = values in this interval not displayed.   ------------------------------------------------------------------------------------------------------------------  Cardiac Enzymes Recent Labs  Lab 09/30/17 1326 09/30/17 1808  TROPONINI >65.00* >65.00*   ------------------------------------------------------------------------------------------------------------------  RADIOLOGY:  No results found.  ASSESSMENT AND PLAN:  *Acute NSTEMI Cardiology input  appreciated-aggressive medical management given advanced age/dye allergy/chronic stage IV kidney disease, continue ACS protocol, aspirin, Plavix, heparin heparin drip for 48-72 hours, statin therapy, hold carvedilol, Avapro, Isordil, continue supplemental oxygen, PRN nitrates, IV morphine for breakthrough pain. Cardiac cath today per Dr. Saralyn Pilar.   *Acute hypotension Off Levophed drip.  Hold hypertension medication.  *Acute hypoxic  respiratory failure due to above. Try to wean off oxygen via nasal cannula,  BiPAP prn. Duneb prn.  *Acute colitis with leukocytosis. Continue Unasyn.  Leukocytosis is improving.  *Acute renal failure on CKD stage 4, ATN due to hypotension.  Worsening. Continue to avoid nephrotoxic agents, strict I&O monitoring, daily weights. Status post permacath placement this morning. Start hemodialysis after cardiac cath and follow-up BMP per Dr. Holley Raring.  *Acute hyponatremia Stable.  Anemia of chronic disease.  Status post PRBC transfusion.  Hemoglobin is 10.  All the records are reviewed and case discussed with Care Management/Social Workerr. Management plans discussed with the patient, his son and grandson and they are in agreement.  CODE STATUS:partial  TOTAL TIME TAKING CARE OF THIS PATIENT: 38 minutes.   POSSIBLE D/C IN 2-3 DAYS, DEPENDING ON CLINICAL CONDITION.   Demetrios Loll M.D on 10/04/2017   Between 7am to 6pm - Pager - [phone]  After 6pm go to [email or web address] - password [email or web address] [facility]  Office  [phone]  CC: Primary care physician; [name], MD  Note: This dictation was prepared with Dragon dictation along with smaller phrase technology. Any transcriptional errors that result from this process are unintentional.

## 2017-10-04 NOTE — Progress Notes (Signed)
Follow up - Critical Care Medicine Note  Patient Details:    Ray Lamb is an 84 y.o. male. with chronic stage IV renal disease, type 2 diabetes, coronary artery disease, prior history of CVA presented to the emergency department with complaint of nausea, vomiting and diarrhea. He also relates to me now that he had associated chest pain.He was noted to have a high potassium in the outpatient setting, was most likely given Kayexalate and had diarrhea. He was admitted to the floor, received fluid resuscitation and went into pulmonary edema and was subsequently transferred to the intensive care unit. Initial troponin was 0.06 and follow up troponin was greater than 65. Repeat EKG was not significantly changed. Was seen by cardiology and felt secondary to renal failure patient was not an ideal PCI candidate and being treated medically.  Lines, Airways, Drains:    Anti-infectives:  Anti-infectives (From admission, onward)   Start     Dose/Rate Route Frequency Ordered Stop   10/03/17 1000  [MAR Hold]  Ampicillin-Sulbactam (UNASYN) 3 g in sodium chloride 0.9 % 100 mL IVPB     (MAR Hold since Thu 10/04/2017 at 0911. Reason: Transfer to a Procedural area.)   3 g 200 mL/hr over 30 Minutes Intravenous Every 24 hours 10/02/17 1217     10/01/17 1000  Ampicillin-Sulbactam (UNASYN) 3 g in sodium chloride 0.9 % 100 mL IVPB  Status:  Discontinued     3 g 200 mL/hr over 30 Minutes Intravenous Every 12 hours 10/01/17 0808 10/02/17 1217   09/30/17 1200  piperacillin-tazobactam (ZOSYN) IVPB 3.375 g  Status:  Discontinued     3.375 g 12.5 mL/hr over 240 Minutes Intravenous Every 12 hours 09/30/17 0720 09/30/17 0836   09/30/17 1000  piperacillin-tazobactam (ZOSYN) IVPB 3.375 g  Status:  Discontinued     3.375 g 12.5 mL/hr over 240 Minutes Intravenous Every 8 hours 09/30/17 0836 10/01/17 0808   09/30/17 0030  piperacillin-tazobactam (ZOSYN) IVPB 3.375 g     3.375 g 100 mL/hr over 30 Minutes Intravenous  Once  09/30/17 0029 09/30/17 0129      Microbiology: Results for orders placed or performed during the hospital encounter of 09/29/17  Culture, blood (routine x 2)     Status: None (Preliminary result)   Collection Time: 09/30/17 12:58 AM  Result Value Ref Range Status   Specimen Description BLOOD BLOOD LEFT FOREARM  Final   Special Requests   Final    BOTTLES DRAWN AEROBIC AND ANAEROBIC Blood Culture adequate volume   Culture   Final    NO GROWTH 4 DAYS Performed at [facility], [address] [place], [place] [address]    Report Status PENDING  Incomplete  Culture, blood (routine x 2)     Status: None (Preliminary result)   Collection Time: 09/30/17 12:58 AM  Result Value Ref Range Status   Specimen Description BLOOD RIGHT ARM  Final   Special Requests   Final    BOTTLES DRAWN AEROBIC AND ANAEROBIC Blood Culture adequate volume   Culture   Final    NO GROWTH 4 DAYS Performed at [facility], [address] [place], [place] [address]    Report Status PENDING  Incomplete  MRSA PCR Screening     Status: None   Collection Time: 09/30/17  1:47 AM  Result Value Ref Range Status   MRSA by PCR NEGATIVE NEGATIVE Final    Comment:        The GeneXpert MRSA Assay (FDA approved  for NASAL specimens only), is one component of a comprehensive MRSA colonization surveillance program. It is not intended to diagnose MRSA infection nor to guide or monitor treatment for MRSA infections. Performed at [facility], [address] [place], [place] [address]   Studies: Ct Abdomen Pelvis Wo Contrast  Result Date: 09/30/2017 CLINICAL DATA:  Patient with nausea, vomiting and diarrhea. End-stage renal disease. EXAM: CT ABDOMEN AND PELVIS WITHOUT CONTRAST TECHNIQUE: Multidetector CT imaging of the abdomen and pelvis was performed following the standard protocol without IV contrast. COMPARISON:  CT abdomen pelvis 03/16/2016. FINDINGS: Lower chest: Normal heart size.  Interval development of bilateral ground-glass opacities and interlobular septal thickening. Small bilateral pleural effusions. Hepatobiliary: Liver is normal in size and contour. Multiple stones within the gallbladder lumen. No gallbladder wall thickening. No intrahepatic or extrahepatic biliary ductal dilatation. Pancreas: Unremarkable Spleen: Unremarkable Adrenals/Urinary Tract: Adrenal glands are normal. Bilateral perinephric fat stranding. Kidneys are symmetric in size. No hydronephrosis. Urinary bladder is unremarkable. Stomach/Bowel: Mild wall thickening of the rectum. Re demonstrated mild wall thickening of the intrathoracic stomach. No evidence for bowel obstruction. No free intraperitoneal air. Large hiatal hernia. Vascular/Lymphatic: Normal caliber abdominal aorta. Peripheral calcified atherosclerotic plaque. Interval increase in size of retroperitoneal adenopathy with a reference 1.6 cm aortocaval lymph node (image 24; series 2), previously measuring 1.2 cm. Left periaortic lymph node measures 1.8 cm (image 26; series 2), previously 1.3 cm. Left periaortic lymph node measures 1.8 cm (image 36; series 2), previously 0.9 cm. Reference paraceliac node measures 1.4 cm (image 20; series 2), previously 0.9 cm. Reproductive: Prostate is enlarged. Other: Small bilateral fat containing inguinal hernias, left-greater-than-right. Musculoskeletal: Lumbar spine degenerative changes. No aggressive or acute appearing osseous lesions. IMPRESSION: 1. Interval increase in size of bulky retroperitoneal and upper abdominal adenopathy concerning for the possibility of lymphoma or metastatic disease. 2. Re demonstrated nonspecific mild wall thickening of the intrathoracic stomach. Large hiatal hernia. 3. Interval development of bilateral lower lobe ground-glass opacities concerning for the possibility of edema. Small bilateral pleural effusions. 4. Mild wall thickening of the rectum.  Colitis not excluded. Electronically  Signed   By: Lovey Newcomer M.D.   On: 09/30/2017 00:21   Dg Chest Port 1 View  Result Date: 10/01/2017 CLINICAL DATA:  Respiratory failure.  Myocardial infarction. EXAM: PORTABLE CHEST 1 VIEW COMPARISON:  09/30/2017 FINDINGS: Stable cardiac enlargement. Stable to slightly improved interstitial edema. No significant pleural effusions identified. No pneumothorax. IMPRESSION: Stable to slightly improved pulmonary interstitial edema. Electronically Signed   By: Aletta Edouard M.D.   On: 10/01/2017 08:28   Dg Chest Port 1 View  Result Date: 09/30/2017 CLINICAL DATA:  Respiratory failure. Shortness of breath and chest pain EXAM: PORTABLE CHEST 1 VIEW COMPARISON:  09/30/2017 FINDINGS: Cardiomegaly and CABG changes again noted. Bilateral interstitial and airspace opacities have slightly improved. There is no evidence of pneumothorax. Hiatal hernia and probable trace effusions noted. No other significant change. IMPRESSION: Slightly improved bilateral interstitial/airspace opacities without other significant change. Electronically Signed   By: Margarette Canada M.D.   On: 09/30/2017 11:27   Dg Chest Port 1 View  Result Date: 09/30/2017 CLINICAL DATA:  Stage IV renal disease EXAM: PORTABLE CHEST 1 VIEW COMPARISON:  Chest radiograph 04/10/2009 FINDINGS: Monitoring leads overlie the patient. Stable cardiac and mediastinal contours. Large hiatal hernia. Diffuse bilateral interstitial pulmonary opacities. Small bilateral pleural effusions. IMPRESSION: Findings compatible with edema and small bilateral pleural effusions. Large hiatal hernia. Electronically Signed   By: Polly Cobia.D.  On: 09/30/2017 00:46    Consults: Treatment Team:  Arta Silence, MD Isaias Cowman, MD Anthonette Legato, MD   Subjective:    Overnight Issues: patient doing well today, pending cardiac catheterization, PermCath for dialysis post procedure  Objective:  Vital signs for last 24 hours: Temp:  [97.6 F (36.4 C)-98.8 F  (37.1 C)] 97.7 F (36.5 C) (08/22 0917) Pulse Rate:  [70-91] 88 (08/22 0917) Resp:  [16-27] 19 (08/22 0917) BP: (84-121)/(43-74) 121/52 (08/22 0917) SpO2:  [99 %-100 %] 99 % (08/22 0945) Weight:  [74.5 kg] 74.5 kg (08/22 0500)  Hemodynamic parameters for last 24 hours:    Intake/Output from previous day: 08/21 0701 - 08/22 0700 In: 1496.9 [P.O.:780; I.V.:312.4; Blood:320; IV Piggyback:84.5] Out: 0   Intake/Output this shift: No intake/output data recorded.  Vent settings for last 24 hours:    Physical Exam:  Vital signs: Please see the above listed vital signs HEENT: No oral lesions appreciated, trachea is midline, no thyromegaly appreciated, no accessory muscle utilization, no jugular venous distention Cardiovascular: Regular rate and rhythm Pulmonary: Clear to auscultation Abdominal: Soft exam Extremities: No clubbing cyanosis or edema is noted Neurologic: No clear focal deficits noted, moves all extremities  Assessment/Plan:   Hypotension. Resolved. Discussed with cardiology, patient is going to have a cardiac catheterization tomorrow  Acute hypoxemic respiratory failure. No difficulties now presently on room air  Hyponatremia at 130. Hesitant to give more fluids secondary to recent flash pulmonary edema  Renal failure. Patient with stage IV renal disease, BUN is 67;4.72. Appreciate nephrology assistance. Post cardiac catheterization patient will require temporary dialysis  NSTEMI. Patient with troponin greater than 65. Appreciate cardiology's assistance. Patient is on aspirin, heparin, statin, carvedilol, Avapro, Isordil.    Ray Lamb 10/04/2017  *Care during the described time interval was provided by me and/or other providers on the critical care team.  I have reviewed this patient's available data, including medical history, events of note, physical examination and test results as part of my evaluation. Patient ID: Ray Lamb, male   DOB: 01-13-1931, 84 y.o.    MRN: [id number] Patient ID: Ray Lamb, male   DOB: 24-Jul-1930, 84 y.o.   MRN: [id number] Patient ID: Ray Lamb, male   DOB: April 22, 1930, 84 y.o.   MRN: [id number]

## 2017-10-04 NOTE — Progress Notes (Signed)
[facility]  ROUNDING NOTE   Subjective:  Patient going for cardiac catheterization, PermCath placement, and initiation of dialysis today. Resting comfortably in bed at the moment. Appears to be in good spirits.   Objective:.  Vital signs in last 24 hours:  Temp:  [97.6 F (36.4 C)-98.8 F (37.1 C)] 98.8 F (37.1 C) (08/22 0200) Pulse Rate:  [70-91] 82 (08/22 0600) Resp:  [16-27] 19 (08/22 0600) BP: (84-118)/(43-74) 105/49 (08/22 0600) SpO2:  [100 %] 100 % (08/22 0600) Weight:  [74.5 kg] 74.5 kg (08/22 0500)  Weight change: 2.9 kg Filed Weights   10/02/17 0408 10/03/17 0500 10/04/17 0500  Weight: 71.8 kg 71.6 kg 74.5 kg    Intake/Output: I/O last 3 completed shifts: In: 1976.9 [P.O.:1260; I.V.:312.4; Blood:320; IV Piggyback:84.5] Out: 100 [Urine:100]   Intake/Output this shift:  No intake/output data recorded.  Physical Exam: General: No acute distress  Head: Normocephalic, atraumatic. Moist oral mucosal membranes  Eyes: Anicteric  Neck: Supple, trachea midline  Lungs:  Clear to auscultation, normal effort  Heart: S1S2 no rubs  Abdomen:  Soft, nontender, bowel sounds present  Extremities: Trace peripheral edema.  Neurologic: Awake, alert, following commands  Skin: No lesions  Access: None    Basic Metabolic Panel: Recent Labs  Lab 09/29/17 2201 09/30/17 0058 09/30/17 0709 10/01/17 0434 10/02/17 0427 10/03/17 0441  NA 126*  --  128* 129* 128* 130*  K 4.8  --  5.2* 4.3 5.3* 4.3  CL 98  --  100 99 98 99  CO2 20*  --  19* 20* 20* 23  GLUCOSE 241*  --  289* 171* 307* 132*  BUN 43*  --  46* 50* 57* 67*  CREATININE 2.21*  --  2.28* 2.91* 3.73* 4.72*  CALCIUM 8.2*  --  7.7* 7.8* 8.0* 8.2*  MG  --  1.5*  --  2.2 2.4  --   PHOS  --  2.5  --  2.7 4.3  --     Liver Function Tests: Recent Labs  Lab 09/29/17 2201  AST 34  ALT 26  ALKPHOS 83  BILITOT 0.6  PROT 7.9  ALBUMIN 3.2*   Recent Labs  Lab 09/29/17 2201  LIPASE 45   No  results for input(s): AMMONIA in the last 168 hours.  CBC: Recent Labs  Lab 09/30/17 0819 10/01/17 0434 10/02/17 0427 10/03/17 0441 10/04/17 0511  WBC 13.6* 17.9* 12.8* 13.2* 13.8*  HGB 10.8* 9.6* 9.2* 8.4* 10.0*  HCT 31.8* 28.6* 27.1* 24.2* 28.8*  MCV 92.7 91.6 93.1 91.4 90.7  PLT 280 289 286 273 315    Cardiac Enzymes: Recent Labs  Lab 09/30/17 0058 09/30/17 0709 09/30/17 0847 09/30/17 1326 09/30/17 1808  TROPONINI 0.26* >65.00* >65.00* >65.00* >65.00*    BNP: Invalid input(s): POCBNP  CBG: Recent Labs  Lab 10/03/17 0858 10/03/17 1319 10/03/17 1607 10/03/17 2122 10/04/17 0748  GLUCAP 189* 298* 265* 309* 224*    Microbiology: Results for orders placed or performed during the hospital encounter of 09/29/17  Culture, blood (routine x 2)     Status: None (Preliminary result)   Collection Time: 09/30/17 12:58 AM  Result Value Ref Range Status   Specimen Description BLOOD BLOOD LEFT FOREARM  Final   Special Requests   Final    BOTTLES DRAWN AEROBIC AND ANAEROBIC Blood Culture adequate volume   Culture   Final    NO GROWTH 4 DAYS Performed at [facility], [address] [place], [place] [address]      Report Status PENDING  Incomplete  Culture, blood (routine x 2)     Status: None (Preliminary result)   Collection Time: 09/30/17 12:58 AM  Result Value Ref Range Status   Specimen Description BLOOD RIGHT ARM  Final   Special Requests   Final    BOTTLES DRAWN AEROBIC AND ANAEROBIC Blood Culture adequate volume   Culture   Final    NO GROWTH 4 DAYS Performed at [facility], [address], [place], [place] [address]    Report Status PENDING  Incomplete  MRSA PCR Screening     Status: None   Collection Time: 09/30/17  1:47 AM  Result Value Ref Range Status   MRSA by PCR NEGATIVE NEGATIVE Final    Comment:        The GeneXpert MRSA Assay (FDA approved for NASAL specimens only), is one component of a comprehensive MRSA  colonization surveillance program. It is not intended to diagnose MRSA infection nor to guide or monitor treatment for MRSA infections. Performed at [facility], [address] [place], [place] [address]     Coagulation Studies: No results for input(s): LABPROT, INR in the last 72 hours.  Urinalysis: No results for input(s): COLORURINE, LABSPEC, PHURINE, GLUCOSEU, HGBUR, BILIRUBINUR, KETONESUR, PROTEINUR, UROBILINOGEN, NITRITE, LEUKOCYTESUR in the last 72 hours.  Invalid input(s): APPERANCEUR    Imaging: No results found.   Medications:   . sodium chloride    . sodium chloride 20 mL/hr (10/03/17 2149)  . sodium chloride    . ampicillin-sulbactam (UNASYN) IV Stopped (10/03/17 1042)  . heparin 800 Units/hr (10/04/17 0356)  . norepinephrine (LEVOPHED) Adult infusion     . aspirin EC  81 mg Oral Daily  . atorvastatin  80 mg Oral QHS  . donepezil  5 mg Oral QHS  . famotidine  20 mg Oral Daily  . feeding supplement (NEPRO CARB STEADY)  237 mL Oral TID BM  . heparin  1,100 Units Intravenous Once  . insulin aspart  0-15 Units Subcutaneous TID WC  . insulin aspart  0-5 Units Subcutaneous QHS  . isosorbide mononitrate  30 mg Oral Daily  . linagliptin  5 mg Oral Daily  . multivitamin with minerals  1 tablet Oral Daily  . sodium chloride flush  3 mL Intravenous Q12H  . timolol  1 drop Both Eyes Daily   sodium chloride, acetaminophen **OR** acetaminophen, bisacodyl, gi cocktail, Melatonin, ondansetron **OR** ondansetron (ZOFRAN) IV, senna-docusate, sodium chloride flush  Assessment/ Plan:  84 y.o. male  with history of hypertension,diabetes, hyperlipidemia, CAD, 4vCABG 2003, hypercholesterolemia, CKD , stroke, pulmonary hypertension, Alzheimer's mild dementia, GERD, severe MR  1.  Acute renal failure/chronic kidney disease stage IV.  Patient due for cardiac catheterization today.  Patient started on low-dose 0.9 normal saline at 20 cc/h given underlying  cardiomyopathy.  Continue to monitor renal parameters closely.  We will start the patient on dialysis given low renal function with an EGFR of 10 after cardiac catheterization.  PermCath also to be placed today.  2.  Hypotension.  Patient currently on norepinephrine to maintain map of 65 or greater.  3.  Hyperkalemia.  Recheck serum potassium tomorrow.  4.  Anemia chronic kidney disease.  Hold off on adding Epogen at this time given recent myocardial infarction.   LOS: 4 Sydnei Ohaver 8/22/20198:39 AM

## 2017-10-04 NOTE — Progress Notes (Signed)
Pre HD assessment    10/04/17 2036  Vital Signs  Temp 98.1 F (36.7 C)  Temp Source Oral  Pulse Rate 99  Pulse Rate Source Monitor  Resp 20  BP (!) 84/51  BP Location Right Arm  BP Method Automatic  Patient Position (if appropriate) Lying  Oxygen Therapy  SpO2 99 %  O2 Device Room Air  Pain Assessment  Pain Scale 0-10  Pain Score 0  Dialysis Weight  Weight 74.5 kg  Type of Weight Pre-Dialysis  Time-Out for Hemodialysis  What Procedure? HD  Pt Identifiers(min of two) First/Last Name;MRN/Account#  Correct Site? Yes  Correct Side? Yes  Correct Procedure? Yes  Consents Verified? Yes  Rad Studies Available? N/A  Safety Precautions Reviewed? Yes  Engineer, civil (consulting) Number  (4A)  Station Number  (bedside ICU 17)  UF/Alarm Test Passed  Conductivity: Meter 13.8  Conductivity: Machine  13.8  pH 7.6  Reverse Osmosis [id number] (WRO #4 )  Normal Saline Lot Number [id number]  Dialyzer Lot Number [id number]  Disposable Set Lot Number [id number]  Machine Temperature 98.6 F (37 C)  Musician and Audible Yes  Blood Lines Intact and Secured Yes  Pre Treatment Patient Checks  Vascular access used during treatment Catheter  Hepatitis B Surface Antigen Results  (unk, labs drawn 10/04/17)  Hepatitis B Surface Antibody  (unk)  Date Hepatitis B Surface Antibody Drawn 10/04/17  Hemodialysis Consent Verified Yes  Hemodialysis Standing Orders Initiated Yes  ECG (Telemetry) Monitor On Yes  Prime Ordered Normal Saline  Length of  DialysisTreatment -hour(s) 1.5 Hour(s)  Dialyzer Elisio 17H NR  Dialysate 2K, 2.5 Ca  Dialysis Anticoagulant None  Dialysate Flow Ordered 300  Blood Flow Rate Ordered 150 mL/min  Ultrafiltration Goal 0 Liters  Pre Treatment Labs Phosphorus (PTH, HBSAG, HBSAB, HBCAB)  Dialysis Blood Pressure Support Ordered Normal Saline  Education / Care Plan  Dialysis Education Provided Yes  Documented Education in Care Plan Yes

## 2017-10-04 NOTE — Op Note (Signed)
OPERATIVE NOTE    PRE-OPERATIVE DIAGNOSIS: 1. ESRD   POST-OPERATIVE DIAGNOSIS: same as above  PROCEDURE: 1. Ultrasound guidance for vascular access to the right internal jugular vein 2. Fluoroscopic guidance for placement of catheter 3. Placement of a 19 cm tip to cuff tunneled hemodialysis catheter via the right internal jugular vein  SURGEON: Leotis Pain, MD  ANESTHESIA:  Local with Moderate conscious sedation for approximately 15 minutes using 1 mg of Versed and 25 mcg of Fentanyl  ESTIMATED BLOOD LOSS: 10 cc  FLUORO TIME: less than one minute  CONTRAST: none  FINDING(S): 1.  Patent right internal jugular vein  SPECIMEN(S):  None  INDICATIONS:   Ray Lamb is a 84 y.o.male who presents with renal failure and multiple other issues.  He is also scheduled for a heart catheterization later today.  The patient needs long term dialysis access for their ESRD, and a Permcath is necessary.  Risks and benefits are discussed and informed consent is obtained.    DESCRIPTION: After obtaining full informed written consent, the patient was brought back to the vascular suited. The patient's right neck and chest were sterilely prepped and draped in a sterile surgical field was created. Moderate conscious sedation was administered during a face to face encounter with the patient throughout the procedure with my supervision of the RN administering medicines and monitoring the patient's vital signs, pulse oximetry, telemetry and mental status throughout from the start of the procedure until the patient was taken to the recovery room.  The right internal jugular vein was visualized with ultrasound and found to be patent. It was then accessed under direct ultrasound guidance and a permanent image was recorded. A wire was placed. After skin nick and dilatation, the peel-away sheath was placed over the wire. I then turned my attention to an area under the clavicle. Approximately 1-2 fingerbreadths below  the clavicle a small counterincision was created and tunneled from the subclavicular incision to the access site. Using fluoroscopic guidance, a 19 centimeter tip to cuff tunneled hemodialysis catheter was selected, and tunneled from the subclavicular incision to the access site. It was then placed through the peel-away sheath and the peel-away sheath was removed. Using fluoroscopic guidance the catheter tips were parked in the right atrium. The appropriate distal connectors were placed. It withdrew blood well and flushed easily with heparinized saline and a concentrated heparin solution was then placed. It was secured to the chest wall with 2 Prolene sutures. The access incision was closed single 4-0 Monocryl. A 4-0 Monocryl pursestring suture was placed around the exit site. Sterile dressings were placed. The patient tolerated the procedure well and was taken to the recovery room in stable condition.  COMPLICATIONS: None  CONDITION: Stable  Leotis Pain, MD 10/04/2017 9:55 AM   This note was created with Dragon Medical transcription system. Any errors in dictation are purely unintentional.

## 2017-10-04 NOTE — Progress Notes (Signed)
Pre HD assessment    10/04/17 2020  Neurological  Level of Consciousness Alert  Orientation Level Oriented X4  Respiratory  Respiratory Pattern Regular;Unlabored  Chest Assessment Chest expansion symmetrical  Cardiac  ECG Monitor Yes  Cardiac Rhythm NSR  Vascular  R Radial Pulse +2  L Radial Pulse +2  Edema Generalized;Right lower extremity;Left lower extremity  Integumentary  Integumentary (WDL) X  Skin Color Appropriate for ethnicity  Musculoskeletal  Musculoskeletal (WDL) X  Generalized Weakness Yes  GU Assessment  Genitourinary (WDL) X  Genitourinary Symptoms  (HD)  Psychosocial  Psychosocial (WDL) WDL  Patient Behaviors Cooperative;Calm  Needs Expressed Educational  Psychosocial Additional Assessments Family behavior  Family Behavior Appropriate for situation;Supportive;Cooperative  Emotional support given Given to patient;Given to patient's family

## 2017-10-04 NOTE — Progress Notes (Signed)
Pt tolerated tx well without c/o. Pt experienced several instances of elevated venous pressures, multiple power flushes needed to be administered in order to get through the HD tx. Net UF 22m, goal met.    10/04/17 2324  Vital Signs  Temp 97.9 F (36.6 C)  Temp Source Oral  Pulse Rate 91  Pulse Rate Source Monitor  Resp 17  BP (!) 92/46  BP Location Right Arm  BP Method Automatic  Patient Position (if appropriate) Lying  Oxygen Therapy  SpO2 98 %  O2 Device Room Air  Dialysis Weight  Weight 74.7 kg  Type of Weight Post-Dialysis  Post-Hemodialysis Assessment  Rinseback Volume (mL) 250 mL  KECN 13.6 V  Dialyzer Clearance Lightly streaked  Duration of HD Treatment -hour(s) 1.5 hour(s)  Hemodialysis Intake (mL) 500 mL  UF Total -Machine (mL) 512 mL  Net UF (mL) 12 mL  Tolerated HD Treatment Yes  Education / Care Plan  Dialysis Education Provided Yes  Documented Education in Care Plan Yes

## 2017-10-04 NOTE — Progress Notes (Signed)
ANTICOAGULATION CONSULT NOTE - Initial Consult  Pharmacy Consult for Heparin drip Indication: Troponin > 65   Allergies  Allergen Reactions  . Bee Venom Hives  . Iodinated Diagnostic Agents Rash  . Metrizamide Rash    Patient Measurements: Height: 5\' 6"  (167.6 cm) Weight: 164 lb 3.9 oz (74.5 kg) IBW/kg (Calculated) : 63.8 Heparin Dosing Weight: 71.4 kg  Vital Signs: Temp: 98.8 F (37.1 C) (08/22 0200) Temp Source: Axillary (08/22 0200) BP: 105/49 (08/22 0600) Pulse Rate: 82 (08/22 0600)  Labs: Recent Labs    10/02/17 0427  10/02/17 2236 10/03/17 0441 10/04/17 0511  HGB 9.2*  --   --  8.4* 10.0*  HCT 27.1*  --   --  24.2* 28.8*  PLT 286  --   --  273 315  HEPARINUNFRC 0.27*   < > 0.47 0.36 0.23*  CREATININE 3.73*  --   --  4.72*  --    < > = values in this interval not displayed.    Estimated Creatinine Clearance: 10.3 mL/min (A) (by C-G formula based on SCr of 4.72 mg/dL (H)).   Medical History: Past Medical History:  Diagnosis Date  . Anemia   . Chronic kidney disease    STAGE 4  . Coronary atherosclerosis   . Diabetes mellitus without complication ([place])   . Edema    ankle  . GERD (gastroesophageal reflux disease)   . Glaucoma   . Hearing loss   . Heart murmur   . History of hiatal hernia   . Hyperlipidemia   . Hypertension   . Mini stroke ([place])   . Mitral insufficiency   . Myocardial infarction ([place])    mild, heart cath done no stents needed  . Stroke [facility]) 2011   TIA    Assessment: 84 yo M to start Heparin drip for Troponin > 65 x 2.  Patient not on Anticoagulant medication PTA per Med Rec. Patient did receive Heparin 5000 units subcutaneously this am at 0631. Hgb 10.8, Plt 280  aptt 29  INR 1.08 Scr 2.28  Heparin infusing at 850 units/hr   Goal of Therapy:  Heparin level 0.3-0.7 units/ml Monitor platelets by anticoagulation protocol: Yes   Plan:  8/20 @ 1355 Heparin level therapeutic @ 0.59. Will continue current infusion rate of  800 units/hr. Recheck in 8 hours. CBC and HL with AM labs per protocol.   8/20 2230 heparin level 0.47. Continue current regimen. Recheck heparin level and CBC with tomorrow AM labs.  8/21 AM heparin level 0.36. Continue current regimen. Recheck heparin level and CBC with tomorrow AM labs.  8/22 AM heparin level 0.23. 1100 unit bolus and increase rate to 950 units/hr. Recheck in 8 hours.  Eloise Harman, PharmD, BCPS Clinical Pharmacist 10/04/2017 6:54 AM

## 2017-10-04 NOTE — Progress Notes (Signed)
Report received from [facility] and Pt returned to ICU. Pt is A&OX4. Denies pain or SOB at this time. Pt on Angiomax at 3.75ml/hr and NS at 20 ml/hr. Pt in the flat straight flat position. PAD on right femoral no signs of bleeding, no swelling, no bruising at the site. Perm cath in place right subclavian. No signs of bleeding, no bruising, or redness at the site.

## 2017-10-04 NOTE — Progress Notes (Signed)
Patient has been NPO since midnight and has received his chewable ASA this morning as ordered.

## 2017-10-05 ENCOUNTER — Encounter: Payer: Self-pay | Admitting: Cardiology

## 2017-10-05 LAB — CULTURE, BLOOD (ROUTINE X 2)
Culture: NO GROWTH
Culture: NO GROWTH
SPECIAL REQUESTS: ADEQUATE
SPECIAL REQUESTS: ADEQUATE

## 2017-10-05 LAB — GLUCOSE, CAPILLARY
Glucose-Capillary: 375 mg/dL — ABNORMAL HIGH (ref 70–99)
Glucose-Capillary: 392 mg/dL — ABNORMAL HIGH (ref 70–99)
Glucose-Capillary: 236 mg/dL — ABNORMAL HIGH (ref 70–99)
Glucose-Capillary: 267 mg/dL — ABNORMAL HIGH (ref 70–99)

## 2017-10-05 LAB — PHOSPHORUS: Phosphorus: 4.6 mg/dL (ref 2.5–4.6)

## 2017-10-05 LAB — BASIC METABOLIC PANEL
Anion gap: 11 (ref 5–15)
BUN: 68 mg/dL — AB (ref 8–23)
CALCIUM: 8.2 mg/dL — AB (ref 8.9–10.3)
CHLORIDE: 98 mmol/L (ref 98–111)
CO2: 20 mmol/L — AB (ref 22–32)
CREATININE: 6.11 mg/dL — AB (ref 0.61–1.24)
GFR calc Af Amer: 9 mL/min — ABNORMAL LOW (ref >60)
GFR calc non Af Amer: 7 mL/min — ABNORMAL LOW (ref >60)
Glucose, Bld: 365 mg/dL — ABNORMAL HIGH (ref 70–99)
Potassium: 4.7 mmol/L (ref 3.5–5.1)
Sodium: 129 mmol/L — ABNORMAL LOW (ref 135–145)

## 2017-10-05 LAB — CBC
HCT: 31 % — ABNORMAL LOW (ref 40.0–52.0)
Hemoglobin: 10.6 g/dL — ABNORMAL LOW (ref 13.0–18.0)
MCH: 31.2 pg (ref 26.0–34.0)
MCHC: 34.1 g/dL (ref 32.0–36.0)
MCV: 91.3 fL (ref 80.0–100.0)
PLATELETS: 351 10*3/uL (ref 150–440)
RBC: 3.39 MIL/uL — AB (ref 4.40–5.90)
RDW: 14.9 % — AB (ref 11.5–14.5)
WBC: 14.4 10*3/uL — ABNORMAL HIGH (ref 3.8–10.6)

## 2017-10-05 MED ORDER — LOPERAMIDE HCL 2 MG PO CAPS
2.0000 mg | ORAL_CAPSULE | Freq: Four times a day (QID) | ORAL | Status: DC | PRN
  Administered 2017-10-05: 2 mg via ORAL
  Filled 2017-10-05: qty 1

## 2017-10-05 MED ORDER — RENA-VITE PO TABS
1.0000 | ORAL_TABLET | Freq: Every day | ORAL | Status: DC
  Administered 2017-10-05 – 2017-10-13 (×7): 1 via ORAL
  Filled 2017-10-05 (×9): qty 1

## 2017-10-05 MED ORDER — HEPARIN SODIUM (PORCINE) 5000 UNIT/ML IJ SOLN
5000.0000 [IU] | Freq: Three times a day (TID) | INTRAMUSCULAR | Status: DC
  Administered 2017-10-05 – 2017-10-14 (×24): 5000 [IU] via SUBCUTANEOUS
  Filled 2017-10-05 (×22): qty 1

## 2017-10-05 MED ORDER — TUBERCULIN PPD 5 UNIT/0.1ML ID SOLN
5.0000 [IU] | Freq: Once | INTRADERMAL | Status: AC
  Administered 2017-10-05: 5 [IU] via INTRADERMAL
  Filled 2017-10-05 (×3): qty 0.1

## 2017-10-05 NOTE — Progress Notes (Signed)
[facility]  SUBJECTIVE: Patient laying in bed, denies chest pain or shortness of breath   Vitals:   10/05/17 0400 10/05/17 0500 10/05/17 0515 10/05/17 0700  BP: (!) 93/46 (!) 104/48 (!) 104/47 (!) 102/50  Pulse: 92 96 92 96  Resp: 16 (!) 22 18 19   Temp:    98 F (36.7 C)  TempSrc:      SpO2: 99% 99% 99% 99%  Weight:  74.7 kg    Height:         Intake/Output Summary (Last 24 hours) at 10/05/2017 0817 Last data filed at 10/05/2017 0600 Gross per 24 hour  Intake 1360.02 ml  Output 12 ml  Net 1348.02 ml      PHYSICAL EXAM  General: Well developed, well nourished, in no acute distress HEENT:  Normocephalic and atramatic Neck:  No JVD.  Lungs: Clear bilaterally to auscultation and percussion. Heart: HRRR . Normal S1 and S2 without gallops or murmurs.  Abdomen: Bowel sounds are positive, abdomen soft and non-tender  Msk:  Back normal, normal gait. Normal strength and tone for age. Extremities: No clubbing, cyanosis or edema.   Neuro: Alert and oriented X 3. Psych:  Good affect, responds appropriately   LABS: Basic Metabolic Panel: Recent Labs    10/03/17 0441 10/04/17 2205 10/05/17 0434  NA 130*  --  129*  K 4.3  --  4.7  CL 99  --  98  CO2 23  --  20*  GLUCOSE 132*  --  365*  BUN 67*  --  68*  CREATININE 4.72*  --  6.11*  CALCIUM 8.2*  --  8.2*  PHOS  --  4.5  --    Liver Function Tests: No results for input(s): AST, ALT, ALKPHOS, BILITOT, PROT, ALBUMIN in the last 72 hours. No results for input(s): LIPASE, AMYLASE in the last 72 hours. CBC: Recent Labs    10/04/17 0511 10/05/17 0434  WBC 13.8* 14.4*  HGB 10.0* 10.6*  HCT 28.8* 31.0*  MCV 90.7 91.3  PLT 315 351   Cardiac Enzymes: No results for input(s): CKTOTAL, CKMB, CKMBINDEX, TROPONINI in the last 72 hours. BNP: Invalid input(s): POCBNP D-Dimer: No results for input(s): DDIMER in the last 72 hours. Hemoglobin A1C: No results for input(s): HGBA1C in the last 72 hours. Fasting Lipid  Panel: No results for input(s): CHOL, HDL, LDLCALC, TRIG, CHOLHDL, LDLDIRECT in the last 72 hours. Thyroid Function Tests: No results for input(s): TSH, T4TOTAL, T3FREE, THYROIDAB in the last 72 hours.  Invalid input(s): FREET3 Anemia Panel: No results for input(s): VITAMINB12, FOLATE, FERRITIN, TIBC, IRON, RETICCTPCT in the last 72 hours.  No results found.   Echo LVEF 35 to 40%  TELEMETRY: Sinus rhythm:  ASSESSMENT AND PLAN:  Active Problems:   Acute hypoxemic respiratory failure (HCC)    1.  Non-ST elevation myocardial infarction 2.  CAD, status post CABG x4 [facility] 12/15/2001 3.  Ischemic cardiomyopathy 4.  Status post DES ostium SVG to OM 2 5.  Acute on chronic renal failure, as post permacath, undergoing dialysis 6.  Anemia, improved after transfusion  Recommendations  1.  Agree with current therapy 2.  Continue dual antiplatelet therapy uninterrupted for 1 year 3.  Hemodialysis today 4.  May transfer to telemetry later today once blood pressure stabilizes   Isaias Cowman, MD, PhD, [facility] 10/05/2017 8:17 AM

## 2017-10-05 NOTE — Progress Notes (Signed)
HD tx end    10/05/17 1641  Vital Signs  Pulse Rate 95  Pulse Rate Source Monitor  Resp 17  BP (!) 110/56  BP Location Right Arm  BP Method Automatic  Patient Position (if appropriate) Lying  Oxygen Therapy  SpO2 100 %  O2 Device Room Air  During Hemodialysis Assessment  Dialysis Fluid Bolus Normal Saline  Bolus Amount (mL) 250 mL  Intra-Hemodialysis Comments Tx completed

## 2017-10-05 NOTE — Progress Notes (Signed)
Patient of mine with renal failure had PCI and getting dialysis. He normally is followed by me in office and advise f/u upon discharge.

## 2017-10-05 NOTE — Progress Notes (Signed)
Post HD assessment    10/05/17 1649  Neurological  Level of Consciousness Alert  Orientation Level Oriented X4  Respiratory  Respiratory Pattern Regular;Unlabored  Chest Assessment Chest expansion symmetrical  Cardiac  ECG Monitor Yes  Cardiac Rhythm NSR;ST  Vascular  R Radial Pulse +2  L Radial Pulse +2  Edema Generalized;Right lower extremity;Left lower extremity  Integumentary  Integumentary (WDL) X  Skin Color Appropriate for ethnicity  Musculoskeletal  Musculoskeletal (WDL) X  Generalized Weakness Yes  GU Assessment  Genitourinary (WDL) X  Genitourinary Symptoms  (HD)  Psychosocial  Psychosocial (WDL) WDL  Patient Behaviors Cooperative;Calm;Appropriate for situation;Appropriate for age  Needs Expressed Emotional  Psychosocial Additional Assessments Family behavior  Family Behavior Appropriate for situation;Supportive;Calm;Cooperative  Emotional support given Given to patient;Given to patient's family

## 2017-10-05 NOTE — Progress Notes (Signed)
Post HD assessment. Pt tolerated tx well without c/o or complication. Net UF 54m, goal met.    10/05/17 1652  Vital Signs  Temp 97.8 F (36.6 C)  Temp Source Oral  Pulse Rate 98  Pulse Rate Source Monitor  Resp 18  BP 117/63  BP Location Right Arm  BP Method Automatic  Patient Position (if appropriate) Lying  Oxygen Therapy  SpO2 100 %  O2 Device Room Air  Dialysis Weight  Weight 76.5 kg  Type of Weight Post-Dialysis  Post-Hemodialysis Assessment  Rinseback Volume (mL) 250 mL  KECN 36.9 V  Dialyzer Clearance Lightly streaked  Duration of HD Treatment -hour(s) 2.5 hour(s)  Hemodialysis Intake (mL) 500 mL  UF Total -Machine (mL) 503 mL  Net UF (mL) 3 mL  Tolerated HD Treatment Yes  Education / Care Plan  Dialysis Education Provided Yes  Documented Education in Care Plan Yes

## 2017-10-05 NOTE — Progress Notes (Signed)
[facility]  ROUNDING NOTE   Subjective:  Patient underwent cardiac catheterization with stent placement yesterday. Tolerated well. PermCath also placed and appreciate vascular surgery assistance. Patient was started on his first dialysis treatment yesterday. Patient denies chest pain this a.m.   Objective:Marland Kitchen  Vital signs in last 24 hours:  Temp:  [97.5 F (36.4 C)-98.1 F (36.7 C)] 98 F (36.7 C) (08/23 0700) Pulse Rate:  [84-109] 96 (08/23 0700) Resp:  [13-23] 19 (08/23 0700) BP: (80-121)/(43-108) 102/50 (08/23 0700) SpO2:  [96 %-100 %] 99 % (08/23 0700) Weight:  [74.5 kg-74.7 kg] 74.7 kg (08/23 0500)  Weight change: 0 kg Filed Weights   10/04/17 2036 10/04/17 2324 10/05/17 0500  Weight: 74.5 kg 74.7 kg 74.7 kg    Intake/Output: I/O last 3 completed shifts: In: 6644 [P.O.:538; I.V.:1008; NG/GT:237] Out: 12 [Other:12]   Intake/Output this shift:  Total I/O In: 15 [I.V.:15] Out: -   Physical Exam: General: No acute distress  Head: Normocephalic, atraumatic. Moist oral mucosal membranes  Eyes: Anicteric  Neck: Supple, trachea midline  Lungs:  Clear to auscultation, normal effort  Heart: S1S2 no rubs  Abdomen:  Soft, nontender, bowel sounds present  Extremities: Trace peripheral edema.  Neurologic: Awake, alert, following commands  Skin: No lesions  Access: Right internal jugular PermCath    Basic Metabolic Panel: Recent Labs  Lab 09/30/17 0058 09/30/17 0709 10/01/17 0434 10/02/17 0427 10/03/17 0441 10/04/17 2205 10/05/17 0434  NA  --  128* 129* 128* 130*  --  129*  K  --  5.2* 4.3 5.3* 4.3  --  4.7  CL  --  100 99 98 99  --  98  CO2  --  19* 20* 20* 23  --  20*  GLUCOSE  --  289* 171* 307* 132*  --  365*  BUN  --  46* 50* 57* 67*  --  68*  CREATININE  --  2.28* 2.91* 3.73* 4.72*  --  6.11*  CALCIUM  --  7.7* 7.8* 8.0* 8.2*  --  8.2*  MG 1.5*  --  2.2 2.4  --   --   --   PHOS 2.5  --  2.7 4.3  --  4.5  --     Liver Function  Tests: Recent Labs  Lab 09/29/17 2201  AST 34  ALT 26  ALKPHOS 83  BILITOT 0.6  PROT 7.9  ALBUMIN 3.2*   Recent Labs  Lab 09/29/17 2201  LIPASE 45   No results for input(s): AMMONIA in the last 168 hours.  CBC: Recent Labs  Lab 10/01/17 0434 10/02/17 0427 10/03/17 0441 10/04/17 0511 10/05/17 0434  WBC 17.9* 12.8* 13.2* 13.8* 14.4*  HGB 9.6* 9.2* 8.4* 10.0* 10.6*  HCT 28.6* 27.1* 24.2* 28.8* 31.0*  MCV 91.6 93.1 91.4 90.7 91.3  PLT 289 286 273 315 351    Cardiac Enzymes: Recent Labs  Lab 09/30/17 0058 09/30/17 0709 09/30/17 0847 09/30/17 1326 09/30/17 1808  TROPONINI 0.26* >65.00* >65.00* >65.00* >65.00*    BNP: Invalid input(s): POCBNP  CBG: Recent Labs  Lab 10/04/17 0921 10/04/17 1149 10/04/17 1615 10/04/17 2109 10/05/17 0754  GLUCAP 240* 200* 268* 359* 375*    Microbiology: Results for orders placed or performed during the hospital encounter of 09/29/17  Culture, blood (routine x 2)     Status: None   Collection Time: 09/30/17 12:58 AM  Result Value Ref Range Status   Specimen Description BLOOD BLOOD LEFT FOREARM  Final   Special Requests  Final    BOTTLES DRAWN AEROBIC AND ANAEROBIC Blood Culture adequate volume   Culture   Final    NO GROWTH 5 DAYS Performed at [facility], [address] [place], [place] [address]    Report Status 10/05/2017 FINAL  Final  Culture, blood (routine x 2)     Status: None   Collection Time: 09/30/17 12:58 AM  Result Value Ref Range Status   Specimen Description BLOOD RIGHT ARM  Final   Special Requests   Final    BOTTLES DRAWN AEROBIC AND ANAEROBIC Blood Culture adequate volume   Culture   Final    NO GROWTH 5 DAYS Performed at [facility], [address] [place], [place] [address]    Report Status 10/05/2017 FINAL  Final  MRSA PCR Screening     Status: None   Collection Time: 09/30/17  1:47 AM  Result Value Ref Range Status   MRSA by PCR NEGATIVE NEGATIVE Final    Comment:         The GeneXpert MRSA Assay (FDA approved for NASAL specimens only), is one component of a comprehensive MRSA colonization surveillance program. It is not intended to diagnose MRSA infection nor to guide or monitor treatment for MRSA infections. Performed at [facility], [address] [place], [place] [address]     Coagulation Studies: No results for input(s): LABPROT, INR in the last 72 hours.  Urinalysis: No results for input(s): COLORURINE, LABSPEC, PHURINE, GLUCOSEU, HGBUR, BILIRUBINUR, KETONESUR, PROTEINUR, UROBILINOGEN, NITRITE, LEUKOCYTESUR in the last 72 hours.  Invalid input(s): APPERANCEUR    Imaging: No results found.   Medications:   . sodium chloride    . sodium chloride    . sodium chloride    . norepinephrine (LEVOPHED) Adult infusion Stopped (10/05/17 0855)   . aspirin  81 mg Oral Daily  . aspirin EC  81 mg Oral Daily  . atorvastatin  80 mg Oral QHS  . Chlorhexidine Gluconate Cloth  6 each Topical Q0600  . clopidogrel  75 mg Oral Q breakfast  . diphenhydrAMINE  12.5 mg Intravenous Once  . donepezil  5 mg Oral QHS  . famotidine  20 mg Oral Daily  . feeding supplement (NEPRO CARB STEADY)  237 mL Oral TID BM  . insulin aspart  0-15 Units Subcutaneous TID WC  . insulin aspart  0-5 Units Subcutaneous QHS  . isosorbide mononitrate  30 mg Oral Daily  . linagliptin  5 mg Oral Daily  . multivitamin with minerals  1 tablet Oral Daily  . sodium chloride flush  3 mL Intravenous Q12H  . timolol  1 drop Both Eyes Daily   sodium chloride, sodium chloride, sodium chloride, acetaminophen **OR** acetaminophen, acetaminophen, alteplase, bisacodyl, gi cocktail, heparin, lidocaine (PF), lidocaine-prilocaine, Melatonin, ondansetron **OR** ondansetron (ZOFRAN) IV, ondansetron (ZOFRAN) IV, pentafluoroprop-tetrafluoroeth, senna-docusate, sodium chloride flush  Assessment/ Plan:  84 y.o. male  with history of hypertension,diabetes, hyperlipidemia, CAD,  4vCABG 2003, hypercholesterolemia, CKD , stroke, pulmonary hypertension, Alzheimer's mild dementia, GERD, severe MR  1.  Acute renal failure/chronic kidney disease stage IV.  Patient did undergo cardiac catheterization yesterday.  He does have worsening renal function.  He was started on hemodialysis yesterday.  We will plan for a second dialysis treatment today and third treatment tomorrow.  He will likely need prolonged hemodialysis and may be end-stage renal disease now.  2.  Hypotension.  Patient still on low-dose levophed.  Wean down as tolerated.  3.  Hyperkalemia.  Potassium now normalized  to 4.7.  Continue to monitor.  4.  Anemia chronic kidney disease.  Hemoglobin 10.6 at last check.  No indication for Procrit at the moment.   LOS: 5 Bernardina Cacho 8/23/20199:48 AM

## 2017-10-05 NOTE — Progress Notes (Signed)
PT Cancellation Note  Patient Details Name: Ray Lamb MRN: [id number] DOB: Aug 01, 1930   Cancelled Treatment:    Reason Eval/Treat Not Completed: Patient at procedure or test/unavailable Spoke with nurse who reports "He's getting ready to start dialysis."  Will not be able to participate with PT today, will try back tomorrow as appropriate.  Kreg Shropshire, DPT 10/05/2017, 1:36 PM

## 2017-10-05 NOTE — Progress Notes (Signed)
Patient alert and oriented. Room air. No complaints of pain or shortness of breath. Tolerating diet. HD today, no fluid removed. Report given to [facility].

## 2017-10-05 NOTE — Progress Notes (Signed)
Follow up - Critical Care Medicine Note  Patient Details:    Ray Lamb is an 84 y.o. male. with chronic stage IV renal disease, type 2 diabetes, coronary artery disease, prior history of CVA presented to the emergency department with complaint of nausea, vomiting and diarrhea. He also relates to me now that he had associated chest pain.He was noted to have a high potassium in the outpatient setting, was most likely given Kayexalate and had diarrhea. He was admitted to the floor, received fluid resuscitation and went into pulmonary edema and was subsequently transferred to the intensive care unit. Initial troponin was 0.06 and follow up troponin was greater than 65. Repeat EKG was not significantly changed. Was seen by cardiology and felt secondary to renal failure patient was not an ideal PCI candidate and being treated medically.  Lines, Airways, Drains:    Anti-infectives:  Anti-infectives (From admission, onward)   Start     Dose/Rate Route Frequency Ordered Stop   10/03/17 1000  Ampicillin-Sulbactam (UNASYN) 3 g in sodium chloride 0.9 % 100 mL IVPB  Status:  Discontinued     3 g 200 mL/hr over 30 Minutes Intravenous Every 24 hours 10/02/17 1217 10/04/17 1120   10/01/17 1000  Ampicillin-Sulbactam (UNASYN) 3 g in sodium chloride 0.9 % 100 mL IVPB  Status:  Discontinued     3 g 200 mL/hr over 30 Minutes Intravenous Every 12 hours 10/01/17 0808 10/02/17 1217   09/30/17 1200  piperacillin-tazobactam (ZOSYN) IVPB 3.375 g  Status:  Discontinued     3.375 g 12.5 mL/hr over 240 Minutes Intravenous Every 12 hours 09/30/17 0720 09/30/17 0836   09/30/17 1000  piperacillin-tazobactam (ZOSYN) IVPB 3.375 g  Status:  Discontinued     3.375 g 12.5 mL/hr over 240 Minutes Intravenous Every 8 hours 09/30/17 0836 10/01/17 0808   09/30/17 0030  piperacillin-tazobactam (ZOSYN) IVPB 3.375 g     3.375 g 100 mL/hr over 30 Minutes Intravenous  Once 09/30/17 0029 09/30/17 0129      Microbiology: Results  for orders placed or performed during the hospital encounter of 09/29/17  Culture, blood (routine x 2)     Status: None   Collection Time: 09/30/17 12:58 AM  Result Value Ref Range Status   Specimen Description BLOOD BLOOD LEFT FOREARM  Final   Special Requests   Final    BOTTLES DRAWN AEROBIC AND ANAEROBIC Blood Culture adequate volume   Culture   Final    NO GROWTH 5 DAYS Performed at [facility], [address] [place], [place] [address]    Report Status 10/05/2017 FINAL  Final  Culture, blood (routine x 2)     Status: None   Collection Time: 09/30/17 12:58 AM  Result Value Ref Range Status   Specimen Description BLOOD RIGHT ARM  Final   Special Requests   Final    BOTTLES DRAWN AEROBIC AND ANAEROBIC Blood Culture adequate volume   Culture   Final    NO GROWTH 5 DAYS Performed at [facility], [address] [place], [place] [address]    Report Status 10/05/2017 FINAL  Final  MRSA PCR Screening     Status: None   Collection Time: 09/30/17  1:47 AM  Result Value Ref Range Status   MRSA by PCR NEGATIVE NEGATIVE Final    Comment:        The GeneXpert MRSA Assay (FDA approved for NASAL specimens only), is one component of a comprehensive MRSA colonization surveillance program. It is  not intended to diagnose MRSA infection nor to guide or monitor treatment for MRSA infections. Performed at [facility], [address] [place], [place] [address]   Studies: Ct Abdomen Pelvis Wo Contrast  Result Date: 09/30/2017 CLINICAL DATA:  Patient with nausea, vomiting and diarrhea. End-stage renal disease. EXAM: CT ABDOMEN AND PELVIS WITHOUT CONTRAST TECHNIQUE: Multidetector CT imaging of the abdomen and pelvis was performed following the standard protocol without IV contrast. COMPARISON:  CT abdomen pelvis 03/16/2016. FINDINGS: Lower chest: Normal heart size. Interval development of bilateral ground-glass opacities and interlobular septal thickening. Small  bilateral pleural effusions. Hepatobiliary: Liver is normal in size and contour. Multiple stones within the gallbladder lumen. No gallbladder wall thickening. No intrahepatic or extrahepatic biliary ductal dilatation. Pancreas: Unremarkable Spleen: Unremarkable Adrenals/Urinary Tract: Adrenal glands are normal. Bilateral perinephric fat stranding. Kidneys are symmetric in size. No hydronephrosis. Urinary bladder is unremarkable. Stomach/Bowel: Mild wall thickening of the rectum. Re demonstrated mild wall thickening of the intrathoracic stomach. No evidence for bowel obstruction. No free intraperitoneal air. Large hiatal hernia. Vascular/Lymphatic: Normal caliber abdominal aorta. Peripheral calcified atherosclerotic plaque. Interval increase in size of retroperitoneal adenopathy with a reference 1.6 cm aortocaval lymph node (image 24; series 2), previously measuring 1.2 cm. Left periaortic lymph node measures 1.8 cm (image 26; series 2), previously 1.3 cm. Left periaortic lymph node measures 1.8 cm (image 36; series 2), previously 0.9 cm. Reference paraceliac node measures 1.4 cm (image 20; series 2), previously 0.9 cm. Reproductive: Prostate is enlarged. Other: Small bilateral fat containing inguinal hernias, left-greater-than-right. Musculoskeletal: Lumbar spine degenerative changes. No aggressive or acute appearing osseous lesions. IMPRESSION: 1. Interval increase in size of bulky retroperitoneal and upper abdominal adenopathy concerning for the possibility of lymphoma or metastatic disease. 2. Re demonstrated nonspecific mild wall thickening of the intrathoracic stomach. Large hiatal hernia. 3. Interval development of bilateral lower lobe ground-glass opacities concerning for the possibility of edema. Small bilateral pleural effusions. 4. Mild wall thickening of the rectum.  Colitis not excluded. Electronically Signed   By: Lovey Newcomer M.D.   On: 09/30/2017 00:21   Dg Chest Port 1 View  Result Date:  10/01/2017 CLINICAL DATA:  Respiratory failure.  Myocardial infarction. EXAM: PORTABLE CHEST 1 VIEW COMPARISON:  09/30/2017 FINDINGS: Stable cardiac enlargement. Stable to slightly improved interstitial edema. No significant pleural effusions identified. No pneumothorax. IMPRESSION: Stable to slightly improved pulmonary interstitial edema. Electronically Signed   By: Aletta Edouard M.D.   On: 10/01/2017 08:28   Dg Chest Port 1 View  Result Date: 09/30/2017 CLINICAL DATA:  Respiratory failure. Shortness of breath and chest pain EXAM: PORTABLE CHEST 1 VIEW COMPARISON:  09/30/2017 FINDINGS: Cardiomegaly and CABG changes again noted. Bilateral interstitial and airspace opacities have slightly improved. There is no evidence of pneumothorax. Hiatal hernia and probable trace effusions noted. No other significant change. IMPRESSION: Slightly improved bilateral interstitial/airspace opacities without other significant change. Electronically Signed   By: Margarette Canada M.D.   On: 09/30/2017 11:27   Dg Chest Port 1 View  Result Date: 09/30/2017 CLINICAL DATA:  Stage IV renal disease EXAM: PORTABLE CHEST 1 VIEW COMPARISON:  Chest radiograph 04/10/2009 FINDINGS: Monitoring leads overlie the patient. Stable cardiac and mediastinal contours. Large hiatal hernia. Diffuse bilateral interstitial pulmonary opacities. Small bilateral pleural effusions. IMPRESSION: Findings compatible with edema and small bilateral pleural effusions. Large hiatal hernia. Electronically Signed   By: Lovey Newcomer M.D.   On: 09/30/2017 00:46    Consults: Treatment Team:  Arta Silence, MD Paraschos,  Sheppard Coil, MD Anthonette Legato, MD   Subjective:    Overnight Issues: over the last 24 hours patient had permanent dialysis access placed and received hemodialysis postcardiac catheterization status post PCI/DES. Over dialysis required norepinephrine support. Presently on low-dose  Objective:  Vital signs for last 24 hours: Temp:  [97.5  F (36.4 C)-98.1 F (36.7 C)] 98 F (36.7 C) (08/23 0700) Pulse Rate:  [84-109] 96 (08/23 0700) Resp:  [13-23] 19 (08/23 0700) BP: (80-121)/(43-108) 102/50 (08/23 0700) SpO2:  [96 %-100 %] 99 % (08/23 0700) Weight:  [74.5 kg-74.7 kg] 74.7 kg (08/23 0500)  Hemodynamic parameters for last 24 hours:    Intake/Output from previous day: 08/22 0701 - 08/23 0700 In: 1360 [P.O.:118; I.V.:1005; NG/GT:237] Out: 12   Intake/Output this shift: Total I/O In: 15 [I.V.:15] Out: -   Vent settings for last 24 hours:    Physical Exam:  Vital signs: Please see the above listed vital signs HEENT: No oral lesions appreciated, trachea is midline, no thyromegaly appreciated, no accessory muscle utilization, no jugular venous distention Cardiovascular: Regular rate and rhythm Pulmonary: Clear to auscultation Abdominal: Soft exam Extremities: No clubbing cyanosis or edema is noted Neurologic: No clear focal deficits noted, moves all extremities  Assessment/Plan:   Hypotension. Presently on low-dose norepinephrine, weaning off as tolerated  Hyponatremia at 129. Will defer to nephrology  Renal failure.started on hemodialysis after perm cath placed. BUN is 68/creatinine is 6.11, potassium 4.7 and CO2 of 20  Hyperglycemia. Patient's last blood sugar was 375, on scale coverage  NSTEMI. Status post PCI/DES  Samhitha Rosen 10/05/2017  *Care during the described time interval was provided by me and/or other providers on the critical care team.  I have reviewed this patient's available data, including medical history, events of note, physical examination and test results as part of my evaluation. Patient ID: TAL KEMPKER, male   DOB: 07-Sep-1930, 84 y.o.   MRN: [id number] Patient ID: HANSEN CARINO, male   DOB: Jul 17, 1930, 84 y.o.   MRN: [id number] Patient ID: CHER EGNOR, male   DOB: 1930/10/30, 84 y.o.   MRN: [id number] Patient ID: RUSTIN ERHART, male   DOB: Jan 12, 1931, 84 y.o.   MRN: [id number]

## 2017-10-05 NOTE — Care Management (Signed)
Message sent to [facility] with [employer] to check on status of PPD for this patient. I also requested that she request order from Nephrologist if appropriate.

## 2017-10-05 NOTE — Progress Notes (Signed)
[facility] NAME: Ray Lamb    MR#:  [id number]  DATE OF BIRTH:  02-20-1930  SUBJECTIVE:  CHIEF COMPLAINT:   Chief Complaint  Patient presents with  . Emesis  . Diarrhea   Patient has no complaints.  Blood pressure is low side, on Levophed drip.  Status postPCI. REVIEW OF SYSTEMS:  CONSTITUTIONAL: No fever, fatigue or weakness.  EYES: No blurred or double vision.  EARS, NOSE, AND THROAT: No tinnitus or ear pain.  RESPIRATORY: No cough, shortness of breath, wheezing or hemoptysis.  CARDIOVASCULAR: No chest pain, orthopnea, edema.  GASTROINTESTINAL: No nausea, vomiting, diarrhea or abdominal pain.  GENITOURINARY: No dysuria, hematuria.  ENDOCRINE: No polyuria, nocturia,  HEMATOLOGY: No anemia, easy bruising or bleeding SKIN: No rash or lesion. MUSCULOSKELETAL: No joint pain or arthritis.   NEUROLOGIC: No tingling, numbness, weakness.  PSYCHIATRY: No anxiety or depression.   ROS  DRUG ALLERGIES:   Allergies  Allergen Reactions  . Bee Venom Hives  . Iodinated Diagnostic Agents Rash  . Metrizamide Rash    VITALS:  Blood pressure 123/61, pulse 92, temperature 97.8 F (36.6 C), temperature source Oral, resp. rate 18, height 5\' 6"  (1.676 m), weight 76.3 kg, SpO2 100 %.  PHYSICAL EXAMINATION:  GENERAL:  84 y.o.-year-old patient lying in the bed with no acute distress.  EYES: Pupils equal, round, reactive to light and accommodation. No scleral icterus. Extraocular muscles intact.  HEENT: Head atraumatic, normocephalic. Oropharynx and nasopharynx clear.  NECK:  Supple, no jugular venous distention. No thyroid enlargement, no tenderness.  Perma cath in situ on the right side. LUNGS: Normal breath sounds bilaterally, no wheezing, rales,rhonchi or crepitation. No use of accessory muscles of respiration.  CARDIOVASCULAR: S1, S2 normal. No murmurs, rubs, or gallops.  ABDOMEN: Soft, nontender, nondistended. Bowel sounds present. No  organomegaly or mass.  EXTREMITIES: No pedal edema, cyanosis, or clubbing.  NEUROLOGIC: Cranial nerves II through XII are intact. Muscle strength 4/5 in all extremities. Sensation intact. Gait not checked.  PSYCHIATRIC: The patient is alert and oriented x 3.  SKIN: No obvious rash, lesion, or ulcer.   Physical Exam LABORATORY PANEL:   CBC Recent Labs  Lab 10/05/17 0434  WBC 14.4*  HGB 10.6*  HCT 31.0*  PLT 351   ------------------------------------------------------------------------------------------------------------------  Chemistries  Recent Labs  Lab 09/29/17 2201  10/02/17 0427  10/05/17 0434  NA 126*   < > 128*   < > 129*  K 4.8   < > 5.3*   < > 4.7  CL 98   < > 98   < > 98  CO2 20*   < > 20*   < > 20*  GLUCOSE 241*   < > 307*   < > 365*  BUN 43*   < > 57*   < > 68*  CREATININE 2.21*   < > 3.73*   < > 6.11*  CALCIUM 8.2*   < > 8.0*   < > 8.2*  MG  --    < > 2.4  --   --   AST 34  --   --   --   --   ALT 26  --   --   --   --   ALKPHOS 83  --   --   --   --   BILITOT 0.6  --   --   --   --    < > = values in this  interval not displayed.   ------------------------------------------------------------------------------------------------------------------  Cardiac Enzymes Recent Labs  Lab 09/30/17 1326 09/30/17 1808  TROPONINI >65.00* >65.00*   ------------------------------------------------------------------------------------------------------------------  RADIOLOGY:  No results found.  ASSESSMENT AND PLAN:  *Acute NSTEMI Patient is treated with aspirin, Plavix and Lipitor.  He was found heparin heparin drip for 48-72 hours. hold carvedilol, Avapro, Isordil, continue supplemental oxygen, PRN nitrates, IV morphine for breakthrough pain. S/p PCI by Dr. Saralyn Lamb.   *Hypotension On levophed drip.  Hold hypertension medication.  *Acute hypoxic respiratory failure due to above. Weaned off oxygen via nasal cannula,  BiPAP prn. Duneb prn.  *Acute  colitis with leukocytosis. Continue Unasyn.  Follow-up CBC.  *Acute renal failure on CKD stage 4, ATN due to hypotension.  Worsening. Continue to avoid nephrotoxic agents, strict I&O monitoring, daily weights. Status post permacath placement this morning. Started hemodialysis, follow-up BMP per Dr. Holley Lamb.  *Acute hyponatremia Stable.  Anemia of chronic disease.  Status post PRBC transfusion.  Hemoglobin is 10.6.  All the records are reviewed and case discussed with Care Management/Social Workerr. Management plans discussed with the patient, his wife, son and grandson and they are in agreement.  CODE STATUS:partial  TOTAL TIME TAKING CARE OF THIS PATIENT: 38 minutes.   POSSIBLE D/C IN 2-3 DAYS, DEPENDING ON CLINICAL CONDITION.   Ray Lamb M.D on 10/05/2017   Between 7am to 6pm - Pager - [phone]  After 6pm go to [email or web address] - password [email or web address] [facility]  Office  [phone]  CC: Primary care physician; Ray Maple, MD  Note: This dictation was prepared with Dragon dictation along with smaller phrase technology. Any transcriptional errors that result from this process are unintentional.

## 2017-10-05 NOTE — Progress Notes (Signed)
Pre HD assessment    10/05/17 1340  Neurological  Level of Consciousness Alert  Orientation Level Oriented X4  Respiratory  Respiratory Pattern Regular;Unlabored  Chest Assessment Chest expansion symmetrical  Cardiac  ECG Monitor Yes  Cardiac Rhythm NSR;ST  Vascular  R Radial Pulse +2  L Radial Pulse +2  Edema Generalized;Right lower extremity;Left lower extremity  Integumentary  Integumentary (WDL) X  Skin Color Appropriate for ethnicity  Musculoskeletal  Musculoskeletal (WDL) X  Generalized Weakness Yes  GU Assessment  Genitourinary (WDL) X  Genitourinary Symptoms  (HD)  Psychosocial  Psychosocial (WDL) WDL  Patient Behaviors Cooperative;Calm;Appropriate for situation;Appropriate for age  Needs Expressed Educational  Psychosocial Additional Assessments Family behavior  Family Behavior Appropriate for situation;Supportive;Calm;Cooperative  Emotional support given Given to patient;Given to patient's family

## 2017-10-05 NOTE — Progress Notes (Signed)
   10/05/17 1045  Clinical Encounter Type  Visited With Patient not available  Visit Type Follow-up  Spiritual Encounters  Spiritual Needs Prayer   Patient unavailable when attempting follow up.  Chaplain offered silent and energetic prayers outside room.

## 2017-10-05 NOTE — Progress Notes (Signed)
Pre HD assessment    10/05/17 1345  Vital Signs  Temp 97.8 F (36.6 C)  Temp Source Oral  Pulse Rate (!) 104  Pulse Rate Source Monitor  Resp 19  BP (!) 122/57  BP Location Right Arm  BP Method Automatic  Patient Position (if appropriate) Lying  Oxygen Therapy  SpO2 100 %  O2 Device Room Air  Pain Assessment  Pain Scale 0-10  Pain Score 0  Dialysis Weight  Weight 76.3 kg  Type of Weight Pre-Dialysis  Time-Out for Hemodialysis  What Procedure? HD  Pt Identifiers(min of two) First/Last Name;MRN/Account#  Correct Site? Yes  Correct Side? Yes  Correct Procedure? Yes  Consents Verified? Yes  Rad Studies Available? N/A  Safety Precautions Reviewed? Yes  Engineer, civil (consulting) Number  (4A)  Station Number  (bedside ICU 17)  UF/Alarm Test Passed  Conductivity: Meter 13.8  Conductivity: Machine  13.8  pH 7.6  Reverse Osmosis [id number] (WRO #4)  Normal Saline Lot Number [id number]  Dialyzer Lot Number [id number]  Disposable Set Lot Number [id number]  Machine Temperature 97.7 F (36.5 C)  Musician and Audible Yes  Blood Lines Intact and Secured Yes  Pre Treatment Patient Checks  Vascular access used during treatment Catheter  Hepatitis B Surface Antigen Results  (unk)  Hepatitis B Surface Antibody  (unk)  Date Hepatitis B Surface Antibody Drawn 10/04/17  Hemodialysis Consent Verified Yes  Hemodialysis Standing Orders Initiated Yes  ECG (Telemetry) Monitor On Yes  Prime Ordered Normal Saline  Length of  DialysisTreatment -hour(s) 2.5 Hour(s)  Dialyzer Elisio 17H NR  Dialysate 3K, 2.5 Ca  Dialysis Anticoagulant None  Dialysate Flow Ordered 500  Blood Flow Rate Ordered 250 mL/min  Ultrafiltration Goal 0 Liters  Pre Treatment Labs Phosphorus  Dialysis Blood Pressure Support Ordered Normal Saline  Education / Care Plan  Dialysis Education Provided Yes  Documented Education in Care Plan Yes

## 2017-10-05 NOTE — Progress Notes (Signed)
Inpatient Diabetes Program Recommendations  AACE/ADA: New Consensus Statement on Inpatient Glycemic Control (2015)  Target Ranges:  Prepandial:   less than 140 mg/dL      Peak postprandial:   less than 180 mg/dL (1-2 hours)      Critically ill patients:  140 - 180 mg/dL   Results for KARDELL, Ray Lamb (MRN [id number]) as of 10/05/2017 08:24  Ref. Range 10/04/2017 07:48 10/04/2017 09:21 10/04/2017 11:49 10/04/2017 16:15 10/04/2017 21:09  Glucose-Capillary Latest Ref Range: 70 - 99 mg/dL 224 (H)  5 units NOVOLOG  240 (H) 200 (H)    125 mg IV Solumedrol X 1 dose 268 (H)  8 units NOVOLOG  359 (H)  8 units NOVOLOG    Results for AKSHATH, Ray Lamb (MRN [id number]) as of 10/05/2017 08:24  Ref. Range 10/05/2017 07:54  Glucose-Capillary Latest Ref Range: 70 - 99 mg/dL 375 (H)     Home DM Meds: Glipizide 5 mg daily       Januvia 25 mg daily  Current Orders: Novolog Moderate Correction Scale/ SSI (0-15 units) TID AC + HS      Tradjenta 5 mg daily     Note patient received 125 mg IV Solumedrol X 1 dose yesterday at 12pm.  CBGs quite elevated after receiving steroids.  Underwent Cardiac Cath yesterday and had 1st Dialysis treatment yesterday as well.     MD- If patient continues to have elevated CBGs, please consider the following:  1. Start Lantus 7 units daily (0.1 units/kg dosing based on weight of 74 kg)  2. Start low dose Novolog Meal Coverage: Novolog 2 units TID with meals   (Please add the following Hold Parameters: Hold if pt eats <50% of meal, Hold if pt NPO)     --Will follow patient during hospitalization--  Wyn Quaker RN, MSN, CDE Diabetes Coordinator Inpatient Glycemic Control Team Team Pager: [phone] (8a-5p)

## 2017-10-05 NOTE — Progress Notes (Signed)
HD tx start    10/05/17 1406  Vital Signs  Pulse Rate 99  Pulse Rate Source Monitor  Resp 17  BP (!) 104/56  BP Location Right Arm  BP Method Automatic  Patient Position (if appropriate) Lying  Oxygen Therapy  SpO2 100 %  O2 Device Room Air  During Hemodialysis Assessment  Blood Flow Rate (mL/min) 250 mL/min  Arterial Pressure (mmHg) -50 mmHg  Venous Pressure (mmHg) 90 mmHg  Transmembrane Pressure (mmHg) 60 mmHg  Ultrafiltration Rate (mL/min) 200 mL/min  Dialysate Flow Rate (mL/min) 500 ml/min  Conductivity: Machine  13.8  HD Safety Checks Performed Yes  Dialysis Fluid Bolus Normal Saline  Bolus Amount (mL) 250 mL  Intra-Hemodialysis Comments Tx initiated

## 2017-10-06 LAB — RENAL FUNCTION PANEL
ALBUMIN: 2.5 g/dL — AB (ref 3.5–5.0)
ANION GAP: 12 (ref 5–15)
BUN: 57 mg/dL — ABNORMAL HIGH (ref 8–23)
CALCIUM: 8.3 mg/dL — AB (ref 8.9–10.3)
CO2: 27 mmol/L (ref 22–32)
Chloride: 93 mmol/L — ABNORMAL LOW (ref 98–111)
Creatinine, Ser: 5.56 mg/dL — ABNORMAL HIGH (ref 0.61–1.24)
GFR calc Af Amer: 10 mL/min — ABNORMAL LOW (ref >60)
GFR, EST NON AFRICAN AMERICAN: 8 mL/min — AB (ref >60)
Glucose, Bld: 323 mg/dL — ABNORMAL HIGH (ref 70–99)
PHOSPHORUS: 3.3 mg/dL (ref 2.5–4.6)
POTASSIUM: 4.3 mmol/L (ref 3.5–5.1)
Sodium: 132 mmol/L — ABNORMAL LOW (ref 135–145)

## 2017-10-06 LAB — GLUCOSE, CAPILLARY
GLUCOSE-CAPILLARY: 180 mg/dL — AB (ref 70–99)
GLUCOSE-CAPILLARY: 213 mg/dL — AB (ref 70–99)
Glucose-Capillary: 245 mg/dL — ABNORMAL HIGH (ref 70–99)
Glucose-Capillary: 261 mg/dL — ABNORMAL HIGH (ref 70–99)

## 2017-10-06 LAB — HEPATITIS B CORE ANTIBODY, IGM: Hep B C IgM: NEGATIVE

## 2017-10-06 LAB — PARATHYROID HORMONE, INTACT (NO CA): PTH: 55 pg/mL (ref 15–65)

## 2017-10-06 LAB — HEPATITIS B SURFACE ANTIBODY, QUANTITATIVE: Hepatitis B-Post: <3.1 m[IU]/mL — ABNORMAL LOW (ref >9.9)

## 2017-10-06 LAB — HEPATITIS B SURFACE ANTIGEN: HEP B S AG: NEGATIVE

## 2017-10-06 LAB — PHOSPHORUS: Phosphorus: 3.3 mg/dL (ref 2.5–4.6)

## 2017-10-06 NOTE — Progress Notes (Signed)
Post HD assessment. Pt tolerated tx well without c/o or complication. Net UF 18, goal met.    10/06/17 1626  Vital Signs  Temp 97.6 F (36.4 C)  Temp Source Oral  Pulse Rate 97  Pulse Rate Source Monitor  Resp 17  BP (!) 148/61  BP Location Right Arm  BP Method Automatic  Patient Position (if appropriate) Lying  Oxygen Therapy  SpO2 100 %  O2 Device Room Air  Dialysis Weight  Weight 78 kg  Type of Weight Post-Dialysis  Post-Hemodialysis Assessment  Rinseback Volume (mL) 250 mL  KECN 53.4 V  Dialyzer Clearance Lightly streaked  Duration of HD Treatment -hour(s) 3 hour(s)  Hemodialysis Intake (mL) 500 mL  UF Total -Machine (mL) 518 mL  Net UF (mL) 18 mL  Tolerated HD Treatment Yes  Hemodialysis Catheter Right Subclavian Double-lumen  Placement Date/Time: 10/05/17 0955   Placed prior to admission: No  Time Out: Correct patient;Correct site;Correct procedure  Person Inserting Catheter: Leotis Pain, MD  Orientation: Right  Access Location: Subclavian  Hemodialysis Catheter Type: Double...  Site Condition No complications  Blue Lumen Status Heparin locked  Red Lumen Status Heparin locked  Purple Lumen Status N/A  Catheter fill solution Heparin 1000 units/ml  Catheter fill volume (Arterial) 1.5 cc  Catheter fill volume (Venous) 1.5  Dressing Type Biopatch  Dressing Status Clean;Dry;Intact  Drainage Description None  Post treatment catheter status Capped and Clamped

## 2017-10-06 NOTE — Progress Notes (Signed)
[facility]  ROUNDING NOTE   Subjective:  Patient continues to do well post cardiac catheterization. No chest pain. He will be due for another dialysis session today. Orders have been prepared.   Objective:Marland Kitchen  Vital signs in last 24 hours:  Temp:  [97.6 F (36.4 C)-98 F (36.7 C)] 98 F (36.7 C) (08/24 0754) Pulse Rate:  [91-108] 100 (08/24 0754) Resp:  [17-27] 20 (08/24 0754) BP: (102-134)/(48-69) 134/69 (08/24 0754) SpO2:  [96 %-100 %] 97 % (08/24 0754) Weight:  [76.3 kg-77.7 kg] 77.4 kg (08/24 0312)  Weight change: 1.8 kg Filed Weights   10/05/17 1652 10/05/17 1959 10/06/17 0312  Weight: 76.5 kg 77.7 kg 77.4 kg    Intake/Output: I/O last 3 completed shifts: In: 1696 [I.V.:1020; NG/GT:237] Out: 140 [Urine:125; Other:15]   Intake/Output this shift:  No intake/output data recorded.  Physical Exam: General: No acute distress  Head: Normocephalic, atraumatic. Moist oral mucosal membranes  Eyes: Anicteric  Neck: Supple, trachea midline  Lungs:  Clear to auscultation, normal effort  Heart: S1S2 no rubs  Abdomen:  Soft, nontender, bowel sounds present  Extremities: Trace peripheral edema.  Neurologic: Awake, alert, following commands  Skin: No lesions  Access: Right internal jugular PermCath    Basic Metabolic Panel: Recent Labs  Lab 09/30/17 0058 09/30/17 0709 10/01/17 0434 10/02/17 0427 10/03/17 0441 10/04/17 2205 10/05/17 0434 10/05/17 1424  NA  --  128* 129* 128* 130*  --  129*  --   K  --  5.2* 4.3 5.3* 4.3  --  4.7  --   CL  --  100 99 98 99  --  98  --   CO2  --  19* 20* 20* 23  --  20*  --   GLUCOSE  --  289* 171* 307* 132*  --  365*  --   BUN  --  46* 50* 57* 67*  --  68*  --   CREATININE  --  2.28* 2.91* 3.73* 4.72*  --  6.11*  --   CALCIUM  --  7.7* 7.8* 8.0* 8.2*  --  8.2*  --   MG 1.5*  --  2.2 2.4  --   --   --   --   PHOS 2.5  --  2.7 4.3  --  4.5  --  4.6    Liver Function Tests: Recent Labs  Lab 09/29/17 2201  AST  34  ALT 26  ALKPHOS 83  BILITOT 0.6  PROT 7.9  ALBUMIN 3.2*   Recent Labs  Lab 09/29/17 2201  LIPASE 45   No results for input(s): AMMONIA in the last 168 hours.  CBC: Recent Labs  Lab 10/01/17 0434 10/02/17 0427 10/03/17 0441 10/04/17 0511 10/05/17 0434  WBC 17.9* 12.8* 13.2* 13.8* 14.4*  HGB 9.6* 9.2* 8.4* 10.0* 10.6*  HCT 28.6* 27.1* 24.2* 28.8* 31.0*  MCV 91.6 93.1 91.4 90.7 91.3  PLT 289 286 273 315 351    Cardiac Enzymes: Recent Labs  Lab 09/30/17 0058 09/30/17 0709 09/30/17 0847 09/30/17 1326 09/30/17 1808  TROPONINI 0.26* >65.00* >65.00* >65.00* >65.00*    BNP: Invalid input(s): POCBNP  CBG: Recent Labs  Lab 10/05/17 0754 10/05/17 1154 10/05/17 1610 10/05/17 2040 10/06/17 0756  GLUCAP 375* 392* 236* 267* 245*    Microbiology: Results for orders placed or performed during the hospital encounter of 09/29/17  Culture, blood (routine x 2)     Status: None   Collection Time: 09/30/17 12:58 AM  Result Value  Ref Range Status   Specimen Description BLOOD BLOOD LEFT FOREARM  Final   Special Requests   Final    BOTTLES DRAWN AEROBIC AND ANAEROBIC Blood Culture adequate volume   Culture   Final    NO GROWTH 5 DAYS Performed at [facility], [address] [place], [place] [address]    Report Status 10/05/2017 FINAL  Final  Culture, blood (routine x 2)     Status: None   Collection Time: 09/30/17 12:58 AM  Result Value Ref Range Status   Specimen Description BLOOD RIGHT ARM  Final   Special Requests   Final    BOTTLES DRAWN AEROBIC AND ANAEROBIC Blood Culture adequate volume   Culture   Final    NO GROWTH 5 DAYS Performed at [facility], [address] [place], [place] [address]    Report Status 10/05/2017 FINAL  Final  MRSA PCR Screening     Status: None   Collection Time: 09/30/17  1:47 AM  Result Value Ref Range Status   MRSA by PCR NEGATIVE NEGATIVE Final    Comment:        The GeneXpert MRSA Assay  (FDA approved for NASAL specimens only), is one component of a comprehensive MRSA colonization surveillance program. It is not intended to diagnose MRSA infection nor to guide or monitor treatment for MRSA infections. Performed at [facility], [address] [place], [place] [address]     Coagulation Studies: No results for input(s): LABPROT, INR in the last 72 hours.  Urinalysis: No results for input(s): COLORURINE, LABSPEC, PHURINE, GLUCOSEU, HGBUR, BILIRUBINUR, KETONESUR, PROTEINUR, UROBILINOGEN, NITRITE, LEUKOCYTESUR in the last 72 hours.  Invalid input(s): APPERANCEUR    Imaging: No results found.   Medications:   . sodium chloride    . sodium chloride    . sodium chloride     . aspirin  81 mg Oral Daily  . aspirin EC  81 mg Oral Daily  . atorvastatin  80 mg Oral QHS  . Chlorhexidine Gluconate Cloth  6 each Topical Q0600  . clopidogrel  75 mg Oral Q breakfast  . diphenhydrAMINE  12.5 mg Intravenous Once  . donepezil  5 mg Oral QHS  . famotidine  20 mg Oral Daily  . feeding supplement (NEPRO CARB STEADY)  237 mL Oral TID BM  . heparin injection (subcutaneous)  5,000 Units Subcutaneous Q8H  . insulin aspart  0-15 Units Subcutaneous TID WC  . insulin aspart  0-5 Units Subcutaneous QHS  . isosorbide mononitrate  30 mg Oral Daily  . linagliptin  5 mg Oral Daily  . multivitamin  1 tablet Oral QHS  . multivitamin with minerals  1 tablet Oral Daily  . sodium chloride flush  3 mL Intravenous Q12H  . timolol  1 drop Both Eyes Daily  . tuberculin  5 Units Intradermal Once   sodium chloride, sodium chloride, sodium chloride, acetaminophen **OR** acetaminophen, acetaminophen, alteplase, bisacodyl, gi cocktail, heparin, lidocaine (PF), lidocaine-prilocaine, loperamide, Melatonin, ondansetron **OR** ondansetron (ZOFRAN) IV, ondansetron (ZOFRAN) IV, pentafluoroprop-tetrafluoroeth, senna-docusate, sodium chloride flush  Assessment/ Plan:  84 y.o. male  with  history of hypertension,diabetes, hyperlipidemia, CAD, 4vCABG 2003, hypercholesterolemia, CKD , stroke, pulmonary hypertension, Alzheimer's mild dementia, GERD, severe MR  1.  Acute renal failure/chronic kidney disease stage IV.  Renal failure persist unfortunately.  He was exposed to contrast given cardiac catheterization.  Patient may have now approached end-stage renal disease.  We will plan for another dialysis session today.  2.  Hypotension.  Patient was successfully weaned off of pressors.  Continue to monitor blood pressure closely.  3.  Hyperkalemia.  Potassium was 4.7 yesterday.  Recheck renal parameters today including potassium.  4.  Anemia chronic kidney disease.  Hold off on Epogen at this time.   LOS: 6 Detron Carras 8/24/20198:07 AM

## 2017-10-06 NOTE — Progress Notes (Signed)
Pre HD assessment   10/06/17 1312  Vital Signs  Temp 98.5 F (36.9 C)  Temp Source Oral  Pulse Rate 91  Pulse Rate Source Monitor  Resp 18  BP 137/65  BP Location Right Arm  BP Method Automatic  Patient Position (if appropriate) Lying  Oxygen Therapy  SpO2 97 %  O2 Device Room Air  Pain Assessment  Pain Scale 0-10  Pain Score 0  Dialysis Weight  Weight 77.8 kg  Type of Weight Pre-Dialysis  Time-Out for Hemodialysis  What Procedure? HD  Pt Identifiers(min of two) First/Last Name;MRN/Account#  Correct Site? Yes  Correct Side? Yes  Correct Procedure? Yes  Consents Verified? Yes  Rad Studies Available? N/A  Safety Precautions Reviewed? Yes  Engineer, civil (consulting) Number  ([id number])  Station Number 2  UF/Alarm Test Passed  Conductivity: Meter 14  Conductivity: Machine  14.2  pH 7.6  Reverse Osmosis main  Normal Saline Lot Number [id number]  Dialyzer Lot Number [id number]  Disposable Set Lot Number [id number]  Machine Temperature 98.6 F (37 C)  Musician and Audible Yes  Blood Lines Intact and Secured Yes  Pre Treatment Patient Checks  Vascular access used during treatment Catheter  Hepatitis B Surface Antigen Results Negative  Date Hepatitis B Surface Antigen Drawn 10/04/17  Hepatitis B Surface Antibody  (<10)  Date Hepatitis B Surface Antibody Drawn 10/04/17  Hemodialysis Consent Verified Yes  Hemodialysis Standing Orders Initiated Yes  ECG (Telemetry) Monitor On Yes  Prime Ordered Normal Saline  Length of  DialysisTreatment -hour(s) 3 Hour(s)  Dialyzer Elisio 17H NR  Dialysate 3K, 2.5 Ca  Dialysis Anticoagulant None  Dialysate Flow Ordered 600  Blood Flow Rate Ordered 300 mL/min  Ultrafiltration Goal 0 Liters  Pre Treatment Labs Renal panel;Phosphorus  Dialysis Blood Pressure Support Ordered Normal Saline  Education / Care Plan  Dialysis Education Provided Yes  Documented Education in Care Plan Yes  Hemodialysis Catheter Right Subclavian Double-lumen   Placement Date/Time: 10/05/17 0955   Placed prior to admission: No  Time Out: Correct patient;Correct site;Correct procedure  Person Inserting Catheter: Leotis Pain, MD  Orientation: Right  Access Location: Subclavian  Hemodialysis Catheter Type: Double...  Site Condition No complications  Blue Lumen Status Heparin locked  Red Lumen Status Heparin locked  Purple Lumen Status N/A  Dressing Type Biopatch  Dressing Status Clean;Dry;Intact  Drainage Description None

## 2017-10-06 NOTE — Progress Notes (Signed)
HD tx start    10/06/17 1316  Vital Signs  Pulse Rate 96  Pulse Rate Source Monitor  Resp (!) 21  BP 134/67  BP Location Right Arm  BP Method Automatic  Patient Position (if appropriate) Lying  Oxygen Therapy  SpO2 100 %  O2 Device Room Air  During Hemodialysis Assessment  Blood Flow Rate (mL/min) 300 mL/min  Arterial Pressure (mmHg) -70 mmHg  Venous Pressure (mmHg) 90 mmHg  Transmembrane Pressure (mmHg) 70 mmHg  Ultrafiltration Rate (mL/min) 170 mL/min  Dialysate Flow Rate (mL/min) 600 ml/min  Conductivity: Machine  14.2  HD Safety Checks Performed Yes  Dialysis Fluid Bolus Normal Saline  Bolus Amount (mL) 250 mL  Intra-Hemodialysis Comments Tx initiated  Hemodialysis Catheter Right Subclavian Double-lumen  Placement Date/Time: 10/05/17 0955   Placed prior to admission: No  Time Out: Correct patient;Correct site;Correct procedure  Person Inserting Catheter: Leotis Pain, MD  Orientation: Right  Access Location: Subclavian  Hemodialysis Catheter Type: Double...  Blue Lumen Status Infusing  Red Lumen Status Infusing

## 2017-10-06 NOTE — Progress Notes (Signed)
Patient Name: Ray Lamb Date of Encounter: 10/06/2017  Hospital Problem List     Active Problems:   Acute hypoxemic respiratory failure [facility])    Patient Profile     Patient status post PCI with DES.  Doing well.  Subjective   Well post PCI.  Inpatient Medications    . aspirin  81 mg Oral Daily  . aspirin EC  81 mg Oral Daily  . atorvastatin  80 mg Oral QHS  . Chlorhexidine Gluconate Cloth  6 each Topical Q0600  . clopidogrel  75 mg Oral Q breakfast  . diphenhydrAMINE  12.5 mg Intravenous Once  . donepezil  5 mg Oral QHS  . famotidine  20 mg Oral Daily  . feeding supplement (NEPRO CARB STEADY)  237 mL Oral TID BM  . heparin injection (subcutaneous)  5,000 Units Subcutaneous Q8H  . insulin aspart  0-15 Units Subcutaneous TID WC  . insulin aspart  0-5 Units Subcutaneous QHS  . isosorbide mononitrate  30 mg Oral Daily  . linagliptin  5 mg Oral Daily  . multivitamin  1 tablet Oral QHS  . multivitamin with minerals  1 tablet Oral Daily  . sodium chloride flush  3 mL Intravenous Q12H  . timolol  1 drop Both Eyes Daily  . tuberculin  5 Units Intradermal Once    Vital Signs    Vitals:   10/05/17 1800 10/05/17 1959 10/06/17 0312 10/06/17 0754  BP: (!) 126/56 (!) 119/56 (!) 116/57 134/69  Pulse: (!) 108 98 91 100  Resp: 18 18 18 20   Temp:  97.6 F (36.4 C) 97.9 F (36.6 C) 98 F (36.7 C)  TempSrc:  Oral Oral Oral  SpO2: 99% 98% 96% 97%  Weight:  77.7 kg 77.4 kg   Height:  5\' 7"  (1.702 m)      Intake/Output Summary (Last 24 hours) at 10/06/2017 1151 Last data filed at 10/05/2017 2300 Gross per 24 hour  Intake -  Output 3 ml  Net -3 ml   Filed Weights   10/05/17 1652 10/05/17 1959 10/06/17 0312  Weight: 76.5 kg 77.7 kg 77.4 kg    Physical Exam    GEN: Well nourished, well developed, in no acute distress.  HEENT: normal.  Neck: Supple, no JVD, carotid bruits, or masses. Cardiac: RRR, no murmurs, rubs, or gallops. No clubbing, cyanosis, edema.   Radials/DP/PT 2+ and equal bilaterally.  Respiratory:  Respirations regular and unlabored, clear to auscultation bilaterally. GI: Soft, nontender, nondistended, BS + x 4. MS: no deformity or atrophy. Skin: warm and dry, no rash. Neuro:  Strength and sensation are intact. Psych: Normal affect.  Labs    CBC Recent Labs    10/04/17 0511 10/05/17 0434  WBC 13.8* 14.4*  HGB 10.0* 10.6*  HCT 28.8* 31.0*  MCV 90.7 91.3  PLT 315 950   Basic Metabolic Panel Recent Labs    10/04/17 2205 10/05/17 0434 10/05/17 1424  NA  --  129*  --   K  --  4.7  --   CL  --  98  --   CO2  --  20*  --   GLUCOSE  --  365*  --   BUN  --  68*  --   CREATININE  --  6.11*  --   CALCIUM  --  8.2*  --   PHOS 4.5  --  4.6   Liver Function Tests No results for input(s): AST, ALT, ALKPHOS, BILITOT, PROT, ALBUMIN in the  last 72 hours. No results for input(s): LIPASE, AMYLASE in the last 72 hours. Cardiac Enzymes No results for input(s): CKTOTAL, CKMB, CKMBINDEX, TROPONINI in the last 72 hours. BNP No results for input(s): BNP in the last 72 hours. D-Dimer No results for input(s): DDIMER in the last 72 hours. Hemoglobin A1C No results for input(s): HGBA1C in the last 72 hours. Fasting Lipid Panel No results for input(s): CHOL, HDL, LDLCALC, TRIG, CHOLHDL, LDLDIRECT in the last 72 hours. Thyroid Function Tests No results for input(s): TSH, T4TOTAL, T3FREE, THYROIDAB in the last 72 hours.  Invalid input(s): FREET3  Telemetry    Sinus rhythm  ECG    Normal sinus rhythm rhythm with septal Q waves.  Radiology    Ct Abdomen Pelvis Wo Contrast  Result Date: 09/30/2017 CLINICAL DATA:  Patient with nausea, vomiting and diarrhea. End-stage renal disease. EXAM: CT ABDOMEN AND PELVIS WITHOUT CONTRAST TECHNIQUE: Multidetector CT imaging of the abdomen and pelvis was performed following the standard protocol without IV contrast. COMPARISON:  CT abdomen pelvis 03/16/2016. FINDINGS: Lower chest: Normal  heart size. Interval development of bilateral ground-glass opacities and interlobular septal thickening. Small bilateral pleural effusions. Hepatobiliary: Liver is normal in size and contour. Multiple stones within the gallbladder lumen. No gallbladder wall thickening. No intrahepatic or extrahepatic biliary ductal dilatation. Pancreas: Unremarkable Spleen: Unremarkable Adrenals/Urinary Tract: Adrenal glands are normal. Bilateral perinephric fat stranding. Kidneys are symmetric in size. No hydronephrosis. Urinary bladder is unremarkable. Stomach/Bowel: Mild wall thickening of the rectum. Re demonstrated mild wall thickening of the intrathoracic stomach. No evidence for bowel obstruction. No free intraperitoneal air. Large hiatal hernia. Vascular/Lymphatic: Normal caliber abdominal aorta. Peripheral calcified atherosclerotic plaque. Interval increase in size of retroperitoneal adenopathy with a reference 1.6 cm aortocaval lymph node (image 24; series 2), previously measuring 1.2 cm. Left periaortic lymph node measures 1.8 cm (image 26; series 2), previously 1.3 cm. Left periaortic lymph node measures 1.8 cm (image 36; series 2), previously 0.9 cm. Reference paraceliac node measures 1.4 cm (image 20; series 2), previously 0.9 cm. Reproductive: Prostate is enlarged. Other: Small bilateral fat containing inguinal hernias, left-greater-than-right. Musculoskeletal: Lumbar spine degenerative changes. No aggressive or acute appearing osseous lesions. IMPRESSION: 1. Interval increase in size of bulky retroperitoneal and upper abdominal adenopathy concerning for the possibility of lymphoma or metastatic disease. 2. Re demonstrated nonspecific mild wall thickening of the intrathoracic stomach. Large hiatal hernia. 3. Interval development of bilateral lower lobe ground-glass opacities concerning for the possibility of edema. Small bilateral pleural effusions. 4. Mild wall thickening of the rectum.  Colitis not excluded.  Electronically Signed   By: Lovey Newcomer M.D.   On: 09/30/2017 00:21   Dg Chest Port 1 View  Result Date: 10/01/2017 CLINICAL DATA:  Respiratory failure.  Myocardial infarction. EXAM: PORTABLE CHEST 1 VIEW COMPARISON:  09/30/2017 FINDINGS: Stable cardiac enlargement. Stable to slightly improved interstitial edema. No significant pleural effusions identified. No pneumothorax. IMPRESSION: Stable to slightly improved pulmonary interstitial edema. Electronically Signed   By: Aletta Edouard M.D.   On: 10/01/2017 08:28   Dg Chest Port 1 View  Result Date: 09/30/2017 CLINICAL DATA:  Respiratory failure. Shortness of breath and chest pain EXAM: PORTABLE CHEST 1 VIEW COMPARISON:  09/30/2017 FINDINGS: Cardiomegaly and CABG changes again noted. Bilateral interstitial and airspace opacities have slightly improved. There is no evidence of pneumothorax. Hiatal hernia and probable trace effusions noted. No other significant change. IMPRESSION: Slightly improved bilateral interstitial/airspace opacities without other significant change. Electronically Signed   By: Dellis Filbert  Hu M.D.   On: 09/30/2017 11:27   Dg Chest Port 1 View  Result Date: 09/30/2017 CLINICAL DATA:  Stage IV renal disease EXAM: PORTABLE CHEST 1 VIEW COMPARISON:  Chest radiograph 04/10/2009 FINDINGS: Monitoring leads overlie the patient. Stable cardiac and mediastinal contours. Large hiatal hernia. Diffuse bilateral interstitial pulmonary opacities. Small bilateral pleural effusions. IMPRESSION: Findings compatible with edema and small bilateral pleural effusions. Large hiatal hernia. Electronically Signed   By: Lovey Newcomer M.D.   On: 09/30/2017 00:46    Assessment & Plan    Coronary artery disease status post carotid bypass grafting-status post STEMI requiring a drug-eluting stent to the ostium of the saphenous vein graft to OM 2.  Doing fairly well from a cardiac standpoint.  Tolerating dual antiplatelet therapy well.  Will dialyze today and if  stable consider discharge to home with outpatient follow-up with Dr. Neoma Laming.  Signed, Javier Docker Jazmin Vensel MD 10/06/2017, 11:51 AM  Pager: [phone]

## 2017-10-06 NOTE — Progress Notes (Signed)
Pre HD assessment    10/06/17 1313  Neurological  Level of Consciousness Alert  Orientation Level Oriented X4  Respiratory  Respiratory Pattern Regular;Unlabored  Chest Assessment Chest expansion symmetrical  Cardiac  ECG Monitor Yes  Cardiac Rhythm NSR  Vascular  R Radial Pulse +2  L Radial Pulse +2  Edema Generalized;Right lower extremity;Left lower extremity  Integumentary  Integumentary (WDL) X  Skin Color Appropriate for ethnicity  Musculoskeletal  Musculoskeletal (WDL) X  Generalized Weakness Yes  Assistive Device None  GU Assessment  Genitourinary (WDL) X  Genitourinary Symptoms  (HD)  Psychosocial  Psychosocial (WDL) WDL

## 2017-10-06 NOTE — Progress Notes (Signed)
[place] [facility] [place] NAME: Ray Lamb    MR#:  [id number]  DATE OF BIRTH:  [birth year]  SUBJECTIVE:   No issues overnight Going for HD today  REVIEW OF SYSTEMS:    Review of Systems  Constitutional: Negative for fever, chills weight loss HENT: Negative for ear pain, nosebleeds, congestion, facial swelling, rhinorrhea, neck pain, neck stiffness and ear discharge.   Respiratory: Negative for cough, shortness of breath, wheezing  Cardiovascular: Negative for chest pain, palpitations and leg swelling.  Gastrointestinal: Negative for heartburn, abdominal pain, vomiting, diarrhea or consitpation Genitourinary: Negative for dysuria, urgency, frequency, hematuria Musculoskeletal: Negative for back pain or joint pain Neurological: Negative for dizziness, seizures, syncope, focal weakness,  numbness and headaches.  Hematological: Does not bruise/bleed easily.  Psychiatric/Behavioral: Negative for hallucinations, confusion, dysphoric mood    Tolerating Diet: yes      DRUG ALLERGIES:   Allergies  Allergen Reactions  . Bee Venom Hives  . Iodinated Diagnostic Agents Rash  . Metrizamide Rash    VITALS:  Blood pressure 134/69, pulse 100, temperature 98 F (36.7 C), temperature source Oral, resp. rate 20, height 5\' 7"  (1.702 m), weight 77.4 kg, SpO2 97 %.  PHYSICAL EXAMINATION:  Constitutional: Appears well-developed and well-nourished. No distress. HENT: Normocephalic. Marland Kitchen Oropharynx is clear and moist.  Eyes: Conjunctivae and EOM are normal. PERRLA, no scleral icterus.  Neck: Normal ROM. Neck supple. No JVD. No tracheal deviation. CVS: RRR, S1/S2 +, 4/6  murmurs, no gallops, no carotid bruit.  Pulmonary: Effort and breath sounds normal, no stridor, rhonchi, wheezes, rales.  PERM CATH PLACED Abdominal: Soft. BS +,  no distension, tenderness, rebound or guarding.  Musculoskeletal: Normal range of motion. No edema and no tenderness.  Neuro:  Alert. CN 2-12 grossly intact. No focal deficits. Skin: Skin is warm and dry. No rash noted. Psychiatric: Normal mood and affect.      LABORATORY PANEL:   CBC Recent Labs  Lab 10/05/17 0434  WBC 14.4*  HGB 10.6*  HCT 31.0*  PLT 351   ------------------------------------------------------------------------------------------------------------------  Chemistries  Recent Labs  Lab 09/29/17 2201  10/02/17 0427  10/05/17 0434  NA 126*   < > 128*   < > 129*  K 4.8   < > 5.3*   < > 4.7  CL 98   < > 98   < > 98  CO2 20*   < > 20*   < > 20*  GLUCOSE 241*   < > 307*   < > 365*  BUN 43*   < > 57*   < > 68*  CREATININE 2.21*   < > 3.73*   < > 6.11*  CALCIUM 8.2*   < > 8.0*   < > 8.2*  MG  --    < > 2.4  --   --   AST 34  --   --   --   --   ALT 26  --   --   --   --   ALKPHOS 83  --   --   --   --   BILITOT 0.6  --   --   --   --    < > = values in this interval not displayed.   ------------------------------------------------------------------------------------------------------------------  Cardiac Enzymes Recent Labs  Lab 09/30/17 0847 09/30/17 1326 09/30/17 1808  TROPONINI >65.00* >65.00* >65.00*   ------------------------------------------------------------------------------------------------------------------  RADIOLOGY:  No results found.   ASSESSMENT AND PLAN:  84 year old male with chronic kidney disease stage V and diabetes who presented to the ER with abdominal pain and found to have non-STEMI.  1.  Non-STEMI: Patient is status post DES ostium SVG to OM 2 Continue isosorbide, aspirin, Plavix, atorvastatin  2.  Diabetes: Continue ADA diet, Tradjenta and sliding scale  3.  Chronic kidney disease stage IV with acute kidney injury now on hemodialysis after exposure to contrast from cardiac catheterization Dialysis as per nephrology 4.  Abdominal pain acute colitis CT scan: Patient has completed course of therapy 5.  Hypotension patient was on Levophed  drip and has been weaned off  Physical therapy is recommending home health upon discharge    Management plans discussed with the patient and he is in agreement.  CODE STATUS: partial  TOTAL TIME TAKING CARE OF THIS PATIENT: 27 minutes.     POSSIBLE D/C 2-4 days, DEPENDING ON CLINICAL CONDITION.   Cincere Deprey M.D on 10/06/2017 at 12:20 PM  Between 7am to 6pm - Pager - [phone] After 6pm go to [email or web address] - password [email or web address] [facility]  Office  [phone]  CC: Primary care physician; [name], MD  Note: This dictation was prepared with Dragon dictation along with smaller phrase technology. Any transcriptional errors that result from this process are unintentional.

## 2017-10-06 NOTE — Progress Notes (Signed)
HD tx end    10/06/17 1622  Vital Signs  Pulse Rate 91  Pulse Rate Source Monitor  Resp 20  BP 131/64  BP Location Right Arm  BP Method Automatic  Patient Position (if appropriate) Lying  Oxygen Therapy  SpO2 100 %  O2 Device Room Air  During Hemodialysis Assessment  Dialysis Fluid Bolus Normal Saline  Bolus Amount (mL) 250 mL  Intra-Hemodialysis Comments Tx completed

## 2017-10-06 NOTE — Evaluation (Signed)
Physical Therapy Evaluation Patient Details Name: Ray Lamb MRN: [id number] DOB: 10/06/1930 Today's Date: 10/06/2017   History of Present Illness  Patient is an 84 year old male admitted for acute hypoxemic respiratory failure following c/o SOB yesterday.  PMH includes stroke, MI, mitral insufficiency, glaucoma, CKD and anemia.  Clinical Impression  Patient is an 84 year old male who is ambulatory with a SPC at baseline.  He does have a fall hx.  Pt able to perform bed mobility mod I and sit at EOB without assistance.  He presented with overall weakness of UE and fair strength of LE's.  Pt able to stand with supervision due to not being familiar with use of RW.  He ambulated 40 ft in room with RW presenting with only mild gait deviations and fatigue.  PT introduced seated there ex which pt was able to perform full sets with VC's for form and resistance.  Pt did present with some confusion throughout treatment, requiring repetition for education but demonstrating understanding in the end.  Pt will continue to benefit from skilled PT to regain strength and tolerance to activity, education for use of RW and HEP.    Follow Up Recommendations Home health PT;Supervision for mobility/OOB    Equipment Recommendations  Rolling walker with 5" wheels(If pt is eligible for coverage for a RW, a youth RW would be appropriate due to stature.)    Recommendations for Other Services       Precautions / Restrictions Precautions Precautions: Fall Restrictions Weight Bearing Restrictions: No      Mobility  Bed Mobility Overal bed mobility: Modified Independent             General bed mobility comments: Increased time.  Transfers Overall transfer level: Needs assistance Equipment used: Rolling walker (2 wheeled) Transfers: Sit to/from Stand Sit to Stand: Supervision         General transfer comment: Safety and VC's for proper use of RW as pt normally uses a  SPC.  Ambulation/Gait Ambulation/Gait assistance: Min guard Gait Distance (Feet): 40 Feet Assistive device: Rolling walker (2 wheeled)     Gait velocity interpretation: <1.31 ft/sec, indicative of household ambulator General Gait Details: Moderate foot clearance and step length, gait slowed as distance progressed.  Pt reported no dizziness but some feelings of weakness.  No LOB's or significant gait deviations.  Stairs            Wheelchair Mobility    Modified Rankin (Stroke Patients Only)       Balance Overall balance assessment: Modified Independent                                           Pertinent Vitals/Pain Pain Assessment: No/denies pain    Home Living Family/patient expects to be discharged to:: Private residence Living Arrangements: Spouse/significant other Available Help at Discharge: Family;Available PRN/intermittently Type of Home: House Home Access: Stairs to enter   Entrance Stairs-Number of Steps: 4 Home Layout: Able to live on main level with bedroom/bathroom Home Equipment: Cane - single point;Walker - 2 wheels      Prior Function Level of Independence: Independent with assistive device(s)         Comments: Has been using SPC due to fear of falling.  Community ambulator; goes to gym sporadically.     Hand Dominance        Extremity/Trunk Assessment  Upper Extremity Assessment Upper Extremity Assessment: Generalized weakness    Lower Extremity Assessment Lower Extremity Assessment: Overall WFL for tasks assessed    Cervical / Trunk Assessment Cervical / Trunk Assessment: Kyphotic  Communication   Communication: No difficulties  Cognition Arousal/Alertness: Awake/alert Behavior During Therapy: WFL for tasks assessed/performed Overall Cognitive Status: Within Functional Limits for tasks assessed                                 General Comments: Appears confused at times but oriented to self  and situation.  Instructions need to be repeated 2-3x.  Follows commands consistently.      General Comments      Exercises General Exercises - Lower Extremity Ankle Circles/Pumps: 20 reps;AROM;Strengthening;Both;Seated Long Arc Quad: Strengthening;Both;10 reps;Seated Hip ABduction/ADduction: Strengthening;Both;10 reps;Seated(Manually resisted hip abduction, pillow squeezes for adduction.) Hip Flexion/Marching: Strengthening;Both;10 reps;Seated Other Exercises Other Exercises: Discussed frequency of exercise and scheduling/pacing during recovery process. x4 min Other Exercises: Provided handout for seated ther ex.   Assessment/Plan    PT Assessment Patient needs continued PT services  PT Problem List Decreased strength;Decreased mobility;Decreased activity tolerance;Decreased knowledge of use of DME       PT Treatment Interventions DME instruction;Therapeutic activities;Gait training;Therapeutic exercise;Patient/family education;Stair training;Balance training;Functional mobility training;Neuromuscular re-education    PT Goals (Current goals can be found in the Care Plan section)  Acute Rehab PT Goals Patient Stated Goal: To return to generally active lifestyle. PT Goal Formulation: With patient Time For Goal Achievement: 10/20/17 Potential to Achieve Goals: Good    Frequency Min 2X/week   Barriers to discharge        Co-evaluation               AM-PAC PT "6 Clicks" Daily Activity  Outcome Measure Difficulty turning over in bed (including adjusting bedclothes, sheets and blankets)?: None Difficulty moving from lying on back to sitting on the side of the bed? : A Little Difficulty sitting down on and standing up from a chair with arms (e.g., wheelchair, bedside commode, etc,.)?: A Little Help needed moving to and from a bed to chair (including a wheelchair)?: A Little Help needed walking in hospital room?: A Little Help needed climbing 3-5 steps with a railing? : A  Little 6 Click Score: 19    End of Session Equipment Utilized During Treatment: Gait belt Activity Tolerance: Patient limited by fatigue Patient left: in chair;with chair alarm set;with call bell/phone within reach;with family/visitor present Nurse Communication: Mobility status PT Visit Diagnosis: Unsteadiness on feet (R26.81)    Time: 0301-3143 PT Time Calculation (min) (ACUTE ONLY): 31 min   Charges:   PT Evaluation $PT Eval Low Complexity: 1 Low PT Treatments $Therapeutic Exercise: 8-22 mins        Roxanne Gates, PT, DPT   Roxanne Gates 10/06/2017, 9:24 AM

## 2017-10-07 ENCOUNTER — Inpatient Hospital Stay: Payer: Medicare Other

## 2017-10-07 LAB — CBC
HEMATOCRIT: 28 % — AB (ref 40.0–52.0)
HEMOGLOBIN: 9.5 g/dL — AB (ref 13.0–18.0)
MCH: 30.8 pg (ref 26.0–34.0)
MCHC: 33.9 g/dL (ref 32.0–36.0)
MCV: 90.9 fL (ref 80.0–100.0)
Platelets: 334 10*3/uL (ref 150–440)
RBC: 3.08 MIL/uL — ABNORMAL LOW (ref 4.40–5.90)
RDW: 14.7 % — AB (ref 11.5–14.5)
WBC: 16.8 10*3/uL — AB (ref 3.8–10.6)

## 2017-10-07 LAB — BASIC METABOLIC PANEL
ANION GAP: 7 (ref 5–15)
Anion gap: 7 (ref 5–15)
BUN: 34 mg/dL — AB (ref 8–23)
BUN: 47 mg/dL — ABNORMAL HIGH (ref 8–23)
CHLORIDE: 102 mmol/L (ref 98–111)
CHLORIDE: 98 mmol/L (ref 98–111)
CO2: 27 mmol/L (ref 22–32)
CO2: 27 mmol/L (ref 22–32)
Calcium: 8.5 mg/dL — ABNORMAL LOW (ref 8.9–10.3)
Calcium: 8.9 mg/dL (ref 8.9–10.3)
Creatinine, Ser: 3.52 mg/dL — ABNORMAL HIGH (ref 0.61–1.24)
Creatinine, Ser: 3.49 mg/dL — ABNORMAL HIGH (ref 0.61–1.24)
GFR calc non Af Amer: 15 mL/min — ABNORMAL LOW (ref >60)
GFR calc non Af Amer: 15 mL/min — ABNORMAL LOW (ref >60)
GFR, EST AFRICAN AMERICAN: 17 mL/min — AB (ref >60)
GFR, EST AFRICAN AMERICAN: 17 mL/min — AB (ref >60)
Glucose, Bld: 194 mg/dL — ABNORMAL HIGH (ref 70–99)
Glucose, Bld: 242 mg/dL — ABNORMAL HIGH (ref 70–99)
POTASSIUM: 4.3 mmol/L (ref 3.5–5.1)
Potassium: 4.3 mmol/L (ref 3.5–5.1)
SODIUM: 136 mmol/L (ref 135–145)
Sodium: 132 mmol/L — ABNORMAL LOW (ref 135–145)

## 2017-10-07 LAB — GLUCOSE, CAPILLARY
GLUCOSE-CAPILLARY: 204 mg/dL — AB (ref 70–99)
GLUCOSE-CAPILLARY: 245 mg/dL — AB (ref 70–99)
GLUCOSE-CAPILLARY: 257 mg/dL — AB (ref 70–99)
GLUCOSE-CAPILLARY: 288 mg/dL — AB (ref 70–99)
Glucose-Capillary: 288 mg/dL — ABNORMAL HIGH (ref 70–99)

## 2017-10-07 LAB — TROPONIN I: Troponin I: 9.58 ng/mL (ref <0.03)

## 2017-10-07 MED ORDER — FUROSEMIDE 10 MG/ML IJ SOLN
40.0000 mg | Freq: Once | INTRAMUSCULAR | Status: AC
  Administered 2017-10-07: 40 mg via INTRAVENOUS
  Filled 2017-10-07: qty 4

## 2017-10-07 MED ORDER — LABETALOL HCL 5 MG/ML IV SOLN
10.0000 mg | INTRAVENOUS | Status: DC | PRN
  Administered 2017-10-07: 10 mg via INTRAVENOUS
  Filled 2017-10-07: qty 4

## 2017-10-07 NOTE — Progress Notes (Signed)
Placed patient on bipap while on floor per Dr Jannifer Franklin due to respiratory distress. Patient transferred to unit without incident.

## 2017-10-07 NOTE — Progress Notes (Signed)
Called by nursing that patient developed shortness of breath.  On evaluation the patient states that he has been getting progressively more short of breath throughout the day.  Tonight his oxygen saturations started to drop, requiring supplementation.  Chest x-ray was ordered and showed pulmonary edema.  EKG was morphologically similar to prior EKGs.  Stat labs including troponin ordered.  Patient was transferred to stepdown unit with BiPAP.  He was given a dose of IV Lasix.  Jacqulyn Bath [facility] 10/07/2017, 11:06 PM

## 2017-10-07 NOTE — Progress Notes (Signed)
[facility]  ROUNDING NOTE   Subjective:  Patient seen at bedside. In good spirits. PPD to be read this evening. He completed hemodialysis yesterday.  Objective:Marland Kitchen  Vital signs in last 24 hours:  Temp:  [97.6 F (36.4 C)-98.5 F (36.9 C)] 97.8 F (36.6 C) (08/25 0723) Pulse Rate:  [85-109] 94 (08/25 0723) Resp:  [12-23] 18 (08/25 0341) BP: (111-150)/(57-85) 145/71 (08/25 0723) SpO2:  [96 %-100 %] 96 % (08/25 0723) Weight:  [77.8 kg-78 kg] 77.9 kg (08/25 0341)  Weight change: 1.5 kg Filed Weights   10/06/17 1312 10/06/17 1626 10/07/17 0341  Weight: 77.8 kg 78 kg 77.9 kg    Intake/Output: I/O last 3 completed shifts: In: 120 [P.O.:120] Out: 168 [Urine:150; Other:18]   Intake/Output this shift:  No intake/output data recorded.  Physical Exam: General: No acute distress  Head: Normocephalic, atraumatic. Moist oral mucosal membranes  Eyes: Anicteric  Neck: Supple, trachea midline  Lungs:  Clear to auscultation, normal effort  Heart: S1S2 no rubs  Abdomen:  Soft, nontender, bowel sounds present  Extremities: Trace peripheral edema.  Neurologic: Awake, alert, following commands  Skin: No lesions  Access: Right internal jugular PermCath    Basic Metabolic Panel: Recent Labs  Lab 10/01/17 0434 10/02/17 0427 10/03/17 0441 10/04/17 2205 10/05/17 0434 10/05/17 1424 10/06/17 1246 10/07/17 0424  NA 129* 128* 130*  --  129*  --  132* 136  K 4.3 5.3* 4.3  --  4.7  --  4.3 4.3  CL 99 98 99  --  98  --  93* 102  CO2 20* 20* 23  --  20*  --  27 27  GLUCOSE 171* 307* 132*  --  365*  --  323* 194*  BUN 50* 57* 67*  --  68*  --  57* 34*  CREATININE 2.91* 3.73* 4.72*  --  6.11*  --  5.56* 3.52*  CALCIUM 7.8* 8.0* 8.2*  --  8.2*  --  8.3* 8.5*  MG 2.2 2.4  --   --   --   --   --   --   PHOS 2.7 4.3  --  4.5  --  4.6 3.3  3.3  --     Liver Function Tests: Recent Labs  Lab 10/06/17 1246  ALBUMIN 2.5*   No results for input(s): LIPASE, AMYLASE in the  last 168 hours. No results for input(s): AMMONIA in the last 168 hours.  CBC: Recent Labs  Lab 10/01/17 0434 10/02/17 0427 10/03/17 0441 10/04/17 0511 10/05/17 0434  WBC 17.9* 12.8* 13.2* 13.8* 14.4*  HGB 9.6* 9.2* 8.4* 10.0* 10.6*  HCT 28.6* 27.1* 24.2* 28.8* 31.0*  MCV 91.6 93.1 91.4 90.7 91.3  PLT 289 286 273 315 351    Cardiac Enzymes: Recent Labs  Lab 09/30/17 1326 09/30/17 1808  TROPONINI >65.00* >65.00*    BNP: Invalid input(s): POCBNP  CBG: Recent Labs  Lab 10/06/17 0756 10/06/17 1154 10/06/17 1802 10/06/17 2045 10/07/17 0720  GLUCAP 245* 261* 180* 213* 204*    Microbiology: Results for orders placed or performed during the hospital encounter of 09/29/17  Culture, blood (routine x 2)     Status: None   Collection Time: 09/30/17 12:58 AM  Result Value Ref Range Status   Specimen Description BLOOD BLOOD LEFT FOREARM  Final   Special Requests   Final    BOTTLES DRAWN AEROBIC AND ANAEROBIC Blood Culture adequate volume   Culture   Final    NO GROWTH 5 DAYS  Performed at [facility], [address] [place], [place] [address]    Report Status 10/05/2017 FINAL  Final  Culture, blood (routine x 2)     Status: None   Collection Time: 09/30/17 12:58 AM  Result Value Ref Range Status   Specimen Description BLOOD RIGHT ARM  Final   Special Requests   Final    BOTTLES DRAWN AEROBIC AND ANAEROBIC Blood Culture adequate volume   Culture   Final    NO GROWTH 5 DAYS Performed at [facility], [address] [place], [place] [address]    Report Status 10/05/2017 FINAL  Final  MRSA PCR Screening     Status: None   Collection Time: 09/30/17  1:47 AM  Result Value Ref Range Status   MRSA by PCR NEGATIVE NEGATIVE Final    Comment:        The GeneXpert MRSA Assay (FDA approved for NASAL specimens only), is one component of a comprehensive MRSA colonization surveillance program. It is not intended to diagnose MRSA infection nor to guide  or monitor treatment for MRSA infections. Performed at [facility], [address] [place], [place] [address]     Coagulation Studies: No results for input(s): LABPROT, INR in the last 72 hours.  Urinalysis: No results for input(s): COLORURINE, LABSPEC, PHURINE, GLUCOSEU, HGBUR, BILIRUBINUR, KETONESUR, PROTEINUR, UROBILINOGEN, NITRITE, LEUKOCYTESUR in the last 72 hours.  Invalid input(s): APPERANCEUR    Imaging: No results found.   Medications:   . sodium chloride     . aspirin  81 mg Oral Daily  . aspirin EC  81 mg Oral Daily  . atorvastatin  80 mg Oral QHS  . Chlorhexidine Gluconate Cloth  6 each Topical Q0600  . clopidogrel  75 mg Oral Q breakfast  . diphenhydrAMINE  12.5 mg Intravenous Once  . donepezil  5 mg Oral QHS  . famotidine  20 mg Oral Daily  . feeding supplement (NEPRO CARB STEADY)  237 mL Oral TID BM  . heparin injection (subcutaneous)  5,000 Units Subcutaneous Q8H  . insulin aspart  0-15 Units Subcutaneous TID WC  . insulin aspart  0-5 Units Subcutaneous QHS  . isosorbide mononitrate  30 mg Oral Daily  . linagliptin  5 mg Oral Daily  . multivitamin  1 tablet Oral QHS  . multivitamin with minerals  1 tablet Oral Daily  . sodium chloride flush  3 mL Intravenous Q12H  . timolol  1 drop Both Eyes Daily  . tuberculin  5 Units Intradermal Once   sodium chloride, acetaminophen **OR** acetaminophen, acetaminophen, bisacodyl, gi cocktail, loperamide, Melatonin, ondansetron **OR** ondansetron (ZOFRAN) IV, ondansetron (ZOFRAN) IV, senna-docusate, sodium chloride flush  Assessment/ Plan:  84 y.o. male  with history of hypertension,diabetes, hyperlipidemia, CAD, 4vCABG 2003, hypercholesterolemia, CKD , stroke, pulmonary hypertension, Alzheimer's mild dementia, GERD, severe MR  1.  Acute renal failure/chronic kidney disease stage IV.  Patient has tolerated initiation of dialysis quite well.  Parameters have significantly improved.  Unclear as to whether  he will progressed to end-stage renal disease but there is some likelihood of this.  Outpatient dialysis center placement pending.  He requests [facility].   2.  Hypotension.  Doing well off of pressors.  Continue to monitor blood pressure trend.  3.  Hyperkalemia.  Resolved with initiation of dialysis.  Potassium today was 4.3.  4.  Anemia chronic kidney disease.  Hemoglobin 10.6 at last check.  Hold off on Epogen at this time but may need to  consider this as an outpatient.   LOS: 7 Alainah Phang 8/25/20198:56 AM

## 2017-10-07 NOTE — Progress Notes (Signed)
[facility] [place] NAME: Ray Lamb    MR#:  [id number]  DATE OF BIRTH:  May 07, 1930  SUBJECTIVE:   Doing ok this am No SOB did notto sleep well last night  REVIEW OF SYSTEMS:    Review of Systems  Constitutional: Negative for fever, chills weight loss HENT: Negative for ear pain, nosebleeds, congestion, facial swelling, rhinorrhea, neck pain, neck stiffness and ear discharge.   Respiratory: Negative for cough, shortness of breath, wheezing  Cardiovascular: Negative for chest pain, palpitations and leg swelling.  Gastrointestinal: Negative for heartburn, abdominal pain, vomiting, diarrhea or consitpation Genitourinary: Negative for dysuria, urgency, frequency, hematuria Musculoskeletal: Negative for back pain or joint pain Neurological: Negative for dizziness, seizures, syncope, focal weakness,  numbness and headaches.  Hematological: Does not bruise/bleed easily.  Psychiatric/Behavioral: Negative for hallucinations, confusion, dysphoric mood    Tolerating Diet: yes      DRUG ALLERGIES:   Allergies  Allergen Reactions  . Bee Venom Hives  . Iodinated Diagnostic Agents Rash  . Metrizamide Rash    VITALS:  Blood pressure (!) 145/71, pulse 94, temperature 97.8 F (36.6 C), temperature source Oral, resp. rate 18, height 5\' 7"  (1.702 m), weight 77.9 kg, SpO2 96 %.  PHYSICAL EXAMINATION:  Constitutional: Appears well-developed and well-nourished. No distress. HENT: Normocephalic. Marland Kitchen Oropharynx is clear and moist.  Eyes: Conjunctivae and EOM are normal. PERRLA, no scleral icterus.  Neck: Normal ROM. Neck supple. No JVD. No tracheal deviation. CVS: RRR, S1/S2 +, 4/6  murmurs, no gallops, no carotid bruit.  Pulmonary: Effort and breath sounds normal, no stridor, rhonchi, wheezes, rales.  PERM CATH PLACED Abdominal: Soft. BS +,  no distension, tenderness, rebound or guarding.  Musculoskeletal: Normal range of motion. No edema and no  tenderness.  Neuro: Alert. CN 2-12 grossly intact. No focal deficits. Skin: Skin is warm and dry. No rash noted. Psychiatric: Normal mood and affect.      LABORATORY PANEL:   CBC Recent Labs  Lab 10/05/17 0434  WBC 14.4*  HGB 10.6*  HCT 31.0*  PLT 351   ------------------------------------------------------------------------------------------------------------------  Chemistries  Recent Labs  Lab 10/02/17 0427  10/07/17 0424  NA 128*   < > 136  K 5.3*   < > 4.3  CL 98   < > 102  CO2 20*   < > 27  GLUCOSE 307*   < > 194*  BUN 57*   < > 34*  CREATININE 3.73*   < > 3.52*  CALCIUM 8.0*   < > 8.5*  MG 2.4  --   --    < > = values in this interval not displayed.   ------------------------------------------------------------------------------------------------------------------  Cardiac Enzymes Recent Labs  Lab 09/30/17 1326 09/30/17 1808  TROPONINI >65.00* >65.00*   ------------------------------------------------------------------------------------------------------------------  RADIOLOGY:  No results found.   ASSESSMENT AND PLAN:   84 year old male with chronic kidney disease stage V and diabetes who presented to the ER with abdominal pain and found to have non-STEMI.  1.  Non-STEMI: Patient is status post DES ostium SVG to OM 2 Continue isosorbide, aspirin, Plavix, atorvastatin  2.  Diabetes: Continue ADA diet, Tradjenta and sliding scale  3.  Chronic kidney disease stage IV with acute kidney injury now on hemodialysis after exposure to contrast from cardiac catheterization Dialysis as per nephrology Outpatient dialysis center placement pending  4.  Abdominal pain acute colitis CT scan: Patient has completed course of therapy 5.  Hypotension patient was on Levophed drip  and has been weaned off  Physical therapy is recommending home health upon discharge    Management plans discussed with the patient and he is in agreement.  CODE STATUS:  partial  TOTAL TIME TAKING CARE OF THIS PATIENT: 24 minutes.     POSSIBLE D/C 1-2days, DEPENDING ON outpatient HD    [name] M.D on 10/07/2017 at 10:24 AM  Between 7am to 6pm - Pager - [phone] After 6pm go to [email or web address] - password [email or web address] [facility]  Office  [phone]  CC: Primary care physician; [name], MD  Note: This dictation was prepared with Dragon dictation along with smaller phrase technology. Any transcriptional errors that result from this process are unintentional.

## 2017-10-07 NOTE — Plan of Care (Signed)
  Problem: Education: Goal: Knowledge of General Education information will improve Description Including pain rating scale, medication(s)/side effects and non-pharmacologic comfort measures Outcome: Progressing   Problem: Health Behavior/Discharge Planning: Goal: Ability to manage health-related needs will improve Outcome: Progressing   Problem: Clinical Measurements: Goal: Will remain free from infection Outcome: Progressing   Problem: Activity: Goal: Risk for activity intolerance will decrease Outcome: Progressing   Problem: Clinical Measurements: Goal: Ability to maintain clinical measurements within normal limits will improve Outcome: Not Progressing

## 2017-10-07 NOTE — Progress Notes (Addendum)
Pt reported that didn't get any sleep last night and this morning. Pt did get his melatonin administered last night but no help at all. Notify prime. Will continue to monitor.  Update 2103: Doctor Bridgett Larsson didn't place any order. He just ordered to give melatonin on pt MAR. Will continue to monitor.  Update 2147: Pt complaints of gasping of AIR. Oxygen was at 87% at RA. Pt was place on oxygen at 2 liters sating at 95%. Pt was tolerating the 2 liter nasal cannula.Will continue to monitor.  Update: 2200: Pt PPD was negative on assessment (10/07/17). Will continue to monitor.   Update 2240: Pt was still complaining of Gasping for air. Oxygen was at 95%. Page prime. Awaiting callback. BP 156/95 and HR 126. Will continue to monitor.  Update 2243: Doctor Jannifer Franklin called and ordered EKG, Chest x-ray, Troponin level, Lasix 40 mg IV once, and labetalol 10 mg IV once. He also ordered to place pt on BIPAP and transfer to ICU. Medicines were administered, pt was place on BIPAP and transfer to ICU at 2345. Give report to Tess (ICU nurse at 2340). Will continue to monitor.

## 2017-10-07 NOTE — Progress Notes (Signed)
eLink Physician-Brief Progress Note Patient Name: Ray Lamb DOB: 1930-08-20 MRN: [id number]   Date of Service  10/07/2017  HPI/Events of Note  54 M with hypoxemic resp failure admitted to SDU for BIPAP. S x-ray shows CHF, troponin went up greater than 65, no acute EKG changes, cardiology seen, heparinized. Status post cardiac catheterization and PermCath placement received hemodialysis yesterday, requiring low-dose norepinephrine. Status post drug-eluting stent. New episode of SOB/Hypoxia on medical floor. Patient being transferred to ICU.  No transfer not available in Epic at this time. Sat = 100% and RR = 20 on  O2. BP = 137/82 and HR = 88. PCCM to assume care in ICU.    eICU Interventions  No new orders.      Intervention Category Evaluation Type: New Patient Evaluation  Lysle Dingwall 10/07/2017, 11:46 PM

## 2017-10-07 NOTE — Progress Notes (Signed)
SUBJECTIVE: patient is doing well   Vitals:   10/06/17 1630 10/06/17 1923 10/07/17 0341 10/07/17 0723  BP:  123/65 (!) 150/85 (!) 145/71  Pulse: 97 85 (!) 109 94  Resp: 12 18 18    Temp:  98.3 F (36.8 C) 98.3 F (36.8 C) 97.8 F (36.6 C)  TempSrc:  Oral Oral Oral  SpO2: 100% 99% 97% 96%  Weight:   77.9 kg   Height:        Intake/Output Summary (Last 24 hours) at 10/07/2017 0933 Last data filed at 10/07/2017 0249 Gross per 24 hour  Intake 120 ml  Output 168 ml  Net -48 ml    LABS: Basic Metabolic Panel: Recent Labs    10/05/17 1424 10/06/17 1246 10/07/17 0424  NA  --  132* 136  K  --  4.3 4.3  CL  --  93* 102  CO2  --  27 27  GLUCOSE  --  323* 194*  BUN  --  57* 34*  CREATININE  --  5.56* 3.52*  CALCIUM  --  8.3* 8.5*  PHOS 4.6 3.3  3.3  --    Liver Function Tests: Recent Labs    10/06/17 1246  ALBUMIN 2.5*   No results for input(s): LIPASE, AMYLASE in the last 72 hours. CBC: Recent Labs    10/05/17 0434  WBC 14.4*  HGB 10.6*  HCT 31.0*  MCV 91.3  PLT 351   Cardiac Enzymes: No results for input(s): CKTOTAL, CKMB, CKMBINDEX, TROPONINI in the last 72 hours. BNP: Invalid input(s): POCBNP D-Dimer: No results for input(s): DDIMER in the last 72 hours. Hemoglobin A1C: No results for input(s): HGBA1C in the last 72 hours. Fasting Lipid Panel: No results for input(s): CHOL, HDL, LDLCALC, TRIG, CHOLHDL, LDLDIRECT in the last 72 hours. Thyroid Function Tests: No results for input(s): TSH, T4TOTAL, T3FREE, THYROIDAB in the last 72 hours.  Invalid input(s): FREET3 Anemia Panel: No results for input(s): VITAMINB12, FOLATE, FERRITIN, TIBC, IRON, RETICCTPCT in the last 72 hours.   PHYSICAL EXAM General: Well developed, well nourished, in no acute distress HEENT:  Normocephalic and atramatic Neck:  No JVD.  Lungs: Clear bilaterally to auscultation and percussion. Heart: HRRR . Normal S1 and S2 without gallops or murmurs.  Abdomen: Bowel sounds are  positive, abdomen soft and non-tender  Msk:  Back normal, normal gait. Normal strength and tone for age. Extremities: No clubbing, cyanosis or edema.   Neuro: Alert and oriented X 3. Psych:  Good affect, responds appropriately  TELEMETRY: sinus rhythm  ASSESSMENT AND PLAN: status post PCI of the OM ostia lesion and doing very well after dialysis. With follow-up patient in the office next week.  Active Problems:   Acute hypoxemic respiratory failure (HCC)    Dionisio David, MD, [facility] 10/07/2017 9:33 AM

## 2017-10-08 ENCOUNTER — Encounter: Payer: Self-pay | Admitting: Adult Health

## 2017-10-08 LAB — BASIC METABOLIC PANEL
Anion gap: 10 (ref 5–15)
BUN: 50 mg/dL — ABNORMAL HIGH (ref 8–23)
CHLORIDE: 97 mmol/L — AB (ref 98–111)
CO2: 28 mmol/L (ref 22–32)
Calcium: 9.1 mg/dL (ref 8.9–10.3)
Creatinine, Ser: 3.57 mg/dL — ABNORMAL HIGH (ref 0.61–1.24)
GFR calc non Af Amer: 14 mL/min — ABNORMAL LOW (ref >60)
GFR, EST AFRICAN AMERICAN: 17 mL/min — AB (ref >60)
GLUCOSE: 260 mg/dL — AB (ref 70–99)
POTASSIUM: 5 mmol/L (ref 3.5–5.1)
Sodium: 135 mmol/L (ref 135–145)

## 2017-10-08 LAB — GLUCOSE, CAPILLARY
GLUCOSE-CAPILLARY: 325 mg/dL — AB (ref 70–99)
Glucose-Capillary: 305 mg/dL — ABNORMAL HIGH (ref 70–99)
Glucose-Capillary: 255 mg/dL — ABNORMAL HIGH (ref 70–99)

## 2017-10-08 LAB — PROCALCITONIN: PROCALCITONIN: 0.46 ng/mL

## 2017-10-08 MED ORDER — HEPARIN SODIUM (PORCINE) 5000 UNIT/ML IJ SOLN
INTRAMUSCULAR | Status: AC
  Administered 2017-10-08: 5000 [IU]
  Filled 2017-10-08: qty 1

## 2017-10-08 MED ORDER — SODIUM CHLORIDE 0.9 % IV SOLN
2.0000 g | Freq: Every day | INTRAVENOUS | Status: DC
  Administered 2017-10-08 – 2017-10-10 (×3): 2 g via INTRAVENOUS
  Filled 2017-10-08 (×5): qty 2

## 2017-10-08 NOTE — Progress Notes (Signed)
PT Cancellation Note  Patient Details Name: Ray Lamb MRN: [id number] DOB: Mar 13, 1930   Cancelled Treatment:    Reason Eval/Treat Not Completed: Other (comment).  Pt transferred to CCU 10/07/17 (initial PT eval performed 10/06/17).  D/t pt transferring to higher level of care, per PT protocol require new PT consult in order to continue therapy (will discontinue current PT order d/t this).  Please re-consult PT when pt is medically appropriate to participate in PT.  Leitha Bleak, PT 10/08/17, 11:25 AM [phone]

## 2017-10-08 NOTE — Progress Notes (Signed)
[facility]  ROUNDING NOTE   Subjective:   Wife and son at bedside.   UOP 1365  Last hemodialysis on Saturday.   Objective:  Vital signs in last 24 hours:  Temp:  [97.5 F (36.4 C)-99 F (37.2 C)] 98.2 F (36.8 C) (08/26 0500) Pulse Rate:  [74-138] 96 (08/26 0700) Resp:  [17-41] 19 (08/26 0700) BP: (94-170)/(49-104) 129/71 (08/26 0700) SpO2:  [87 %-100 %] 96 % (08/26 0700) FiO2 (%):  [50 %] 50 % (08/25 2345) Weight:  [76.8 kg] 76.8 kg (08/26 0500)  Weight change: -1 kg Filed Weights   10/06/17 1626 10/07/17 0341 10/08/17 0500  Weight: 78 kg 77.9 kg 76.8 kg    Intake/Output: I/O last 3 completed shifts: In: 240 [P.O.:240] Out: 1245 [Urine:1515]   Intake/Output this shift:  Total I/O In: 240 [P.O.:240] Out: -   Physical Exam: General: NAD, laying in bed.   Head: Normocephalic, atraumatic. Moist oral mucosal membranes  Eyes: Anicteric, PERRL  Neck: Supple, trachea midline  Lungs:  Clear to auscultation  Heart: Regular rate and rhythm  Abdomen:  Soft, nontender,   Extremities: No peripheral edema.  Neurologic: Nonfocal, moving all four extremities  Skin: No lesions  Access: RIJ permcath     Basic Metabolic Panel: Recent Labs  Lab 10/02/17 0427  10/04/17 2205 10/05/17 0434 10/05/17 1424 10/06/17 1246 10/07/17 0424 10/07/17 2307 10/08/17 0422  NA 128*   < >  --  129*  --  132* 136 132* 135  K 5.3*   < >  --  4.7  --  4.3 4.3 4.3 5.0  CL 98   < >  --  98  --  93* 102 98 97*  CO2 20*   < >  --  20*  --  27 27 27 28   GLUCOSE 307*   < >  --  365*  --  323* 194* 242* 260*  BUN 57*   < >  --  68*  --  57* 34* 47* 50*  CREATININE 3.73*   < >  --  6.11*  --  5.56* 3.52* 3.49* 3.57*  CALCIUM 8.0*   < >  --  8.2*  --  8.3* 8.5* 8.9 9.1  MG 2.4  --   --   --   --   --   --   --   --   PHOS 4.3  --  4.5  --  4.6 3.3  3.3  --   --   --    < > = values in this interval not displayed.    Liver Function Tests: Recent Labs  Lab 10/06/17 1246   ALBUMIN 2.5*   No results for input(s): LIPASE, AMYLASE in the last 168 hours. No results for input(s): AMMONIA in the last 168 hours.  CBC: Recent Labs  Lab 10/02/17 0427 10/03/17 0441 10/04/17 0511 10/05/17 0434 10/07/17 2307  WBC 12.8* 13.2* 13.8* 14.4* 16.8*  HGB 9.2* 8.4* 10.0* 10.6* 9.5*  HCT 27.1* 24.2* 28.8* 31.0* 28.0*  MCV 93.1 91.4 90.7 91.3 90.9  PLT 286 273 315 351 334    Cardiac Enzymes: Recent Labs  Lab 10/07/17 2307  TROPONINI 9.58*    BNP: Invalid input(s): POCBNP  CBG: Recent Labs  Lab 10/07/17 0720 10/07/17 1146 10/07/17 1637 10/07/17 2016 10/07/17 2343  GLUCAP 204* 288* 288* 245* 40*    Microbiology: Results for orders placed or performed during the hospital encounter of 09/29/17  Culture, blood (routine x 2)  Status: None   Collection Time: 09/30/17 12:58 AM  Result Value Ref Range Status   Specimen Description BLOOD BLOOD LEFT FOREARM  Final   Special Requests   Final    BOTTLES DRAWN AEROBIC AND ANAEROBIC Blood Culture adequate volume   Culture   Final    NO GROWTH 5 DAYS Performed at [facility], [address] [place], [place] [address]    Report Status 10/05/2017 FINAL  Final  Culture, blood (routine x 2)     Status: None   Collection Time: 09/30/17 12:58 AM  Result Value Ref Range Status   Specimen Description BLOOD RIGHT ARM  Final   Special Requests   Final    BOTTLES DRAWN AEROBIC AND ANAEROBIC Blood Culture adequate volume   Culture   Final    NO GROWTH 5 DAYS Performed at [facility], [address] [place], [place] [address]    Report Status 10/05/2017 FINAL  Final  MRSA PCR Screening     Status: None   Collection Time: 09/30/17  1:47 AM  Result Value Ref Range Status   MRSA by PCR NEGATIVE NEGATIVE Final    Comment:        The GeneXpert MRSA Assay (FDA approved for NASAL specimens only), is one component of a comprehensive MRSA colonization surveillance program. It is  not intended to diagnose MRSA infection nor to guide or monitor treatment for MRSA infections. Performed at [facility], [address] [place], [place] [address]     Coagulation Studies: No results for input(s): LABPROT, INR in the last 72 hours.  Urinalysis: No results for input(s): COLORURINE, LABSPEC, PHURINE, GLUCOSEU, HGBUR, BILIRUBINUR, KETONESUR, PROTEINUR, UROBILINOGEN, NITRITE, LEUKOCYTESUR in the last 72 hours.  Invalid input(s): APPERANCEUR    Imaging: Dg Chest Port 1 View  Result Date: 10/07/2017 CLINICAL DATA:  Cough, difficulty breathing EXAM: PORTABLE CHEST 1 VIEW COMPARISON:  10/01/2017 FINDINGS: Right dialysis catheter is in place with the tip in the right atrium. Prior CABG. Cardiomegaly. Diffuse interstitial opacities throughout the lungs compatible with edema. Small bilateral effusions. Left lower lobe atelectasis. Large hiatal hernia. IMPRESSION: Cardiomegaly with diffuse interstitial prominence throughout the lungs compatible with interstitial edema/CHF. Large hiatal hernia. Small bilateral effusions. Electronically Signed   By: Rolm Baptise M.D.   On: 10/07/2017 23:02     Medications:   . sodium chloride     . aspirin EC  81 mg Oral Daily  . atorvastatin  80 mg Oral QHS  . Chlorhexidine Gluconate Cloth  6 each Topical Q0600  . clopidogrel  75 mg Oral Q breakfast  . diphenhydrAMINE  12.5 mg Intravenous Once  . donepezil  5 mg Oral QHS  . famotidine  20 mg Oral Daily  . feeding supplement (NEPRO CARB STEADY)  237 mL Oral TID BM  . heparin injection (subcutaneous)  5,000 Units Subcutaneous Q8H  . insulin aspart  0-15 Units Subcutaneous TID WC  . insulin aspart  0-5 Units Subcutaneous QHS  . isosorbide mononitrate  30 mg Oral Daily  . linagliptin  5 mg Oral Daily  . multivitamin  1 tablet Oral QHS  . multivitamin with minerals  1 tablet Oral Daily  . sodium chloride flush  3 mL Intravenous Q12H  . timolol  1 drop Both Eyes Daily   sodium  chloride, acetaminophen **OR** acetaminophen, acetaminophen, bisacodyl, gi cocktail, labetalol, loperamide, Melatonin, ondansetron **OR** ondansetron (ZOFRAN) IV, senna-docusate, sodium chloride flush  Assessment/ Plan:  [name] is a  84 y.o. white male with dementia, hypertension, diabetes, coronary artery disease status post CABG, CVA, pulmonary hypertension, severe mitral regurgitation.   1. Acute renal failure on chronic kidney disease stage IV with hyperkalemia and proteinuria: baseline creatinine of 2.79, GFR of 20 on 09/13/17 Chronic kidney disease secondary to diabetes, hypertension and vascular disease Acute renal failure secondary to prerenal azotemia with diarrhea and GI losses.  No acute indication for dialysis today. Last hemodialysis was on Saturday, 8/24.  Nonoliguric urine output.  - Monitor daily for dialysis need.  - Holding furosemide  2. Hypertension: blood pressure at goal. Off vasopressors.  - imdur  3. Anemia of chronic kidney disease: hemoglobin 9.5 - no indication for ESA.    LOS: [place] 8/26/201910:36 AM

## 2017-10-08 NOTE — Progress Notes (Signed)
SUBJECTIVE: Pt is less short of breath. Currently on nasal canula and sitting in bed eating breakfast. He reports he is concerned about his shortness of breath and is considering requesting to be transferred to [facility] since he has several doctors there who are following him .   Vitals:   10/08/17 0400 10/08/17 0500 10/08/17 0600 10/08/17 0700  BP: 138/74 123/60 (!) 119/53 129/71  Pulse: 91 76 80 96  Resp: (!) 23 (!) 21 19 19   Temp:  98.2 F (36.8 C)    TempSrc:  Axillary    SpO2: 100% 100% 100% 96%  Weight:  76.8 kg    Height:        Intake/Output Summary (Last 24 hours) at 10/08/2017 0847 Last data filed at 10/08/2017 0600 Gross per 24 hour  Intake 120 ml  Output 1365 ml  Net -1245 ml    LABS: Basic Metabolic Panel: Recent Labs    10/05/17 1424 10/06/17 1246  10/07/17 2307 10/08/17 0422  NA  --  132*   < > 132* 135  K  --  4.3   < > 4.3 5.0  CL  --  93*   < > 98 97*  CO2  --  27   < > 27 28  GLUCOSE  --  323*   < > 242* 260*  BUN  --  57*   < > 47* 50*  CREATININE  --  5.56*   < > 3.49* 3.57*  CALCIUM  --  8.3*   < > 8.9 9.1  PHOS 4.6 3.3  3.3  --   --   --    < > = values in this interval not displayed.   Liver Function Tests: Recent Labs    10/06/17 1246  ALBUMIN 2.5*   No results for input(s): LIPASE, AMYLASE in the last 72 hours. CBC: Recent Labs    10/07/17 2307  WBC 16.8*  HGB 9.5*  HCT 28.0*  MCV 90.9  PLT 334   Cardiac Enzymes: Recent Labs    10/07/17 2307  TROPONINI 9.58*   BNP: Invalid input(s): POCBNP D-Dimer: No results for input(s): DDIMER in the last 72 hours. Hemoglobin A1C: No results for input(s): HGBA1C in the last 72 hours. Fasting Lipid Panel: No results for input(s): CHOL, HDL, LDLCALC, TRIG, CHOLHDL, LDLDIRECT in the last 72 hours. Thyroid Function Tests: No results for input(s): TSH, T4TOTAL, T3FREE, THYROIDAB in the last 72 hours.  Invalid input(s): FREET3 Anemia Panel: No results for input(s): VITAMINB12, FOLATE,  FERRITIN, TIBC, IRON, RETICCTPCT in the last 72 hours.   PHYSICAL EXAM General: Pale, weak sitting in bed, alert and responsive to questions. HEENT:  Normocephalic and atramatic Neck:  No JVD.  Lungs: Clear bilaterally to auscultation and percussion. Heart: Tachycardic  Abdomen: Bowel sounds are positive, abdomen soft and non-tender  Extremities: No clubbing, cyanosis or edema.   Neuro: Alert and oriented X 3. Psych:  Good affect, responds appropriately  TELEMETRY: Sinus tachycardia 107bpm  ASSESSMENT AND PLAN: S/P PCI with DES to OM ostia lesion. Developed pulmonary edema with hypoxia and shortness of breath and was transferred back to ICU with BiPAP. Feeling a bit better this morning after IV Lasix but still short of breath. Advise follow up with nephrology for further input on lasix vs further dialysis.  Cardiac perspective, continue Plavix, aspirin, Lipitor, and isosorbide. Monitor H&H.  Active Problems:   Acute hypoxemic respiratory failure ([place])    Jake Bathe, NP-C 10/08/2017 8:47 AM Cell: [phone]

## 2017-10-08 NOTE — Progress Notes (Signed)
Name: Ray Lamb MRN: [id number] DOB: November 07, 1930     CONSULTATION DATE: 09/29/2017  Objective & subjective: Improved respiratory status, on BiPAP and tolerating nasal cannula.  Responding to diuresis and no plans for hemodialysis today.  PAST MEDICAL HISTORY :   has a past medical history of Anemia, Chronic kidney disease, Coronary atherosclerosis, Diabetes mellitus without complication ([place]), Edema, GERD (gastroesophageal reflux disease), Glaucoma, Hearing loss, Heart murmur, History of hiatal hernia, Hyperlipidemia, Hypertension, Mini stroke ([place]), Mitral insufficiency, Myocardial infarction [facility]), and Stroke ([place]) (2011).  has a past surgical history that includes Esophagogastroduodenoscopy (egd) with propofol (N/A, 04/25/2016); Cataract extraction w/PHACO (Left, 09/05/2016); Coronary artery bypass graft (2003); Joint replacement (Right, 2010); Skin cancer excision; Cataract extraction w/PHACO (Right, 09/26/2016); DIALYSIS/PERMA CATHETER INSERTION (N/A, 10/04/2017); and LEFT HEART CATH AND CORONARY ANGIOGRAPHY (N/A, 10/04/2017). Prior to Admission medications   Medication Sig Start Date End Date Taking? Authorizing Provider  atorvastatin (LIPITOR) 80 MG tablet Take 1 tablet (80 mg total) by mouth at bedtime. 02/26/17  Yes Crissman, Jeannette How, MD  carvedilol (COREG) 25 MG tablet Take 25 mg by mouth 2 (two) times daily.  06/28/17 06/28/18 Yes [provider]  donepezil (ARICEPT) 5 MG tablet Take 5 mg by mouth at bedtime.  03/22/16 09/30/17 Yes [provider]  ezetimibe (ZETIA) 10 MG tablet Take 1 tablet (10 mg total) by mouth daily. 02/26/17  Yes Crissman, Jeannette How, MD  furosemide (LASIX) 40 MG tablet Take 40 mg by mouth daily as needed.  11/17/15  Yes [provider]  glipiZIDE (GLUCOTROL) 5 MG tablet Take 1 tablet (5 mg total) by mouth daily. 02/26/17  Yes [name], MD  isosorbide mononitrate (IMDUR) 30 MG 24 hr tablet  05/17/15  Yes [provider]  JANUVIA 25 MG  tablet Take 1 tablet (25 mg total) by mouth daily. 02/26/17  Yes Crissman, Mark A, MD  mometasone (ELOCON) 0.1 % lotion APPLY 4 DROPS INTO EACH EAR DAILY FOR 10 DAYS THEN AS NEEDED FOR DRY SKIN AND ITCHING 04/23/15  Yes [provider]  Multiple Vitamin (MULTIVITAMIN) tablet Take 1 tablet by mouth daily.   Yes [provider]  olmesartan (BENICAR) 20 MG tablet Take 20 mg by mouth daily.    Yes [provider]  timolol (TIMOPTIC) 0.5 % ophthalmic solution Place 1 drop into both eyes daily.    Yes [provider]  TURMERIC PO Take 1,300 mg by mouth.   Yes [provider]  amoxicillin (AMOXIL) 500 MG capsule TAKE 4 CAPSULES BY MOUTH ONE HOUR PRIOR TO APPT 04/02/17   [provider]  aspirin EC 81 MG tablet Take 81 mg by mouth daily.    [provider]  digoxin (LANOXIN) 0.125 MG tablet Take 62.5 mcg by mouth every other day. 04/18/17   [provider]  metoprolol succinate (TOPROL-XL) 25 MG 24 hr tablet Take 1 tablet (25 mg total) by mouth daily. Patient not taking: Reported on 09/30/2017 02/26/17   [name], MD   Allergies  Allergen Reactions  . Bee Venom Hives  . Iodinated Diagnostic Agents Rash  . Metrizamide Rash    FAMILY HISTORY:  family history includes Diabetes in his maternal grandmother, mother, and sister; Stroke in his mother. SOCIAL HISTORY:  reports that he quit smoking about 53 years ago. He has never used smokeless tobacco. He reports that he does not drink alcohol or use drugs.  REVIEW OF SYSTEMS:   Unable to obtain due to critical illness  VITAL SIGNS: Temp:  [97.5 F (36.4 C)-99 F (37.2 C)] 98.2 F (36.8 C) (08/26 0500) Pulse Rate:  [74-138] 106 (08/26 1200) Resp:  [17-41] 26 (08/26 1200) BP: (94-170)/(49-104) 140/70 (08/26 1200) SpO2:  [87 %-100 %] 98 % (08/26 1200) FiO2 (%):  [50 %] 50 % (08/25 2345) Weight:  [76.8 kg] 76.8 kg (08/26 0500)  Physical Examination:  Awake and oriented  with no acute neurological deficits Tolerating nasal cannula, no distress, able to talk in full sentences, bilateral equal air entry with bibasilar medium crackles S1 & S2 are audible with no murmur Benign abdominal exam was normal peristalsis Bilateral pedal leg edema   ASSESSMENT / PLAN: Acute respiratory failure (improved) of BiPAP and tolerating nasal cannula -Monitor ABG and work of breathing.  Pneumonia and atelectasis.  Bibasilar airspace disease and leukocytosis was worsening respiratory status -Empiric cefepime.  Monitor CXR + CBC + FiO2 -MRSA PCR negative  AKI on CKD stage IV with hyperkalemia proteinuria.  RRT as per renal -Management as per nephrology last dialysis was on 8/24  CHF with pulmonary edema and recent STEMI status post DES on 10/04/2017.  Baseline CAD status post CABG and PCI, on dual antiplatelet aspirin + Plavix. Echo on 09/2017 LVEF 35 to 40% with anteroseptal and apical hypokinesis. -Management as per cardiology -Diuresis/RRT  Diabetes mellitus -Glycemic control  Anemia of chronic disease -Keep hemoglobin more than 8 g/dL  CODE STATUS is to DO NOT INTUBATE  DVT & GI prophylaxis.  Continue with supportive care Patient and the family were updated at the bedside and they agreed to the plan of care  Critical care time 35 minutes

## 2017-10-08 NOTE — Progress Notes (Signed)
   10/08/17 1435  Clinical Encounter Type  Visited With Patient and family together  Visit Type Follow-up  Spiritual Encounters  Spiritual Needs Emotional   Chaplain followed up with patient and family, offering emotional support and active and reflective listening as they processed changes in patient's health over last week.  Patient and family remain open to ongoing chaplain follow up.

## 2017-10-08 NOTE — Progress Notes (Signed)
Great day. BiPAP off since 0730 . Weaned from 4 liters to 2 liters without issues. Up in chair without issues x 2 hours.

## 2017-10-08 NOTE — Progress Notes (Signed)
Pharmacy Antibiotic Note  Ray Lamb is a 84 y.o. male admitted on 09/29/2017 with acute respiratory failure.  Pharmacy has been consulted for cefepime dosing for possible PNA.  Plan: Cefepime 2 g iv q 24 hours.   Height: 5\' 7"  (170.2 cm) Weight: 169 lb 5 oz (76.8 kg) IBW/kg (Calculated) : 66.1  Temp (24hrs), Avg:98.2 F (36.8 C), Min:97.5 F (36.4 C), Max:99 F (37.2 C)  Recent Labs  Lab 10/02/17 0427 10/03/17 0441 10/04/17 0511 10/05/17 0434 10/06/17 1246 10/07/17 0424 10/07/17 2307 10/08/17 0422  WBC 12.8* 13.2* 13.8* 14.4*  --   --  16.8*  --   CREATININE 3.73* 4.72*  --  6.11* 5.56* 3.52* 3.49* 3.57*    Estimated Creatinine Clearance: 14.1 mL/min (A) (by C-G formula based on SCr of 3.57 mg/dL (H)).    Allergies  Allergen Reactions  . Bee Venom Hives  . Iodinated Diagnostic Agents Rash  . Metrizamide Rash    Antimicrobials this admission: Cefepime 8/26 >>   Dose adjustments this admission:   Microbiology results: 8/18 BCx: NG 8/18 MRSA PCR: negative  Thank you for allowing pharmacy to be a part of this patient's care.  Napoleon Form 10/08/2017 3:27 PM

## 2017-10-08 NOTE — Progress Notes (Signed)
Inpatient Diabetes Program Recommendations  AACE/ADA: New Consensus Statement on Inpatient Glycemic Control (2015)  Target Ranges:  Prepandial:   less than 140 mg/dL      Peak postprandial:   less than 180 mg/dL (1-2 hours)      Critically ill patients:  140 - 180 mg/dL   Results for LAMIN, CHANDLEY (MRN [id number]) as of 10/08/2017 09:18  Ref. Range 10/07/2017 07:20 10/07/2017 11:46 10/07/2017 16:37 10/07/2017 20:16  Glucose-Capillary Latest Ref Range: 70 - 99 mg/dL 204 (H)  5 units NOVOLOG  288 (H)  8 units NOVOLOG  288 (H)  8 units NOVOLOG  245 (H)  2 units NOVOLOG    Results for RIGGINS, CISEK (MRN [id number]) as of 10/08/2017 09:18  Ref. Range 10/07/2017 23:43  Glucose-Capillary Latest Ref Range: 70 - 99 mg/dL 257 (H)  8 units NOVOLOG     Home DM Meds: Glipizide 5 mg daily                             Januvia 25 mg daily  Current Orders: Novolog Moderate Correction Scale/ SSI (0-15 units) TID AC + HS                            Tradjenta 5 mg daily      MD- If patient continues to have elevated CBGs, please consider the following:  1. Start Lantus 7 units daily (0.1 units/kg dosing based on weight of 74 kg)  2. Start low dose Novolog Meal Coverage: Novolog 3 units TID with meals   (Please add the following Hold Parameters: Hold if pt eats <50% of meal, Hold if pt NPO)    --Will follow patient during hospitalization--  Wyn Quaker RN, MSN, CDE Diabetes Coordinator Inpatient Glycemic Control Team Team Pager: [phone] (8a-5p)

## 2017-10-08 NOTE — Progress Notes (Signed)
Bipap on standby, Pt on 2L [place] sats 100%

## 2017-10-08 NOTE — Consult Note (Addendum)
PULMONARY / CRITICAL CARE MEDICINE   Name: Ray Lamb MRN: [id number] DOB: Aug 31, 1930    ADMISSION DATE:  09/29/2017   CONSULTATION DATE:  09/30/17  REFERRING MD:  Dr Jodell Cipro  REASON: Acute respiratory failure  HISTORY OF PRESENT ILLNESS:   This is an 84 year old male with a history of chronic kidney disease stage IV, type 2 diabetes, CAD and CVA presented to the ED with complaints of nausea vomiting and diarrhea x3 days.  Nausea vomiting and diarrhea also associated with abdominal pain which he describes as diffuse and sharp.  He attributes it to his hiatal hernia.  He states that he was given some medications by his MD to treat his high potassium and the medication gave him a horrible diarrhea.  At the ED, he said he was given IV fluids and became acutely short of breath.  His chest x-ray showed diffuse pulmonary edema and he was admitted to the ICU. Initial troponin was 0.06 and follow up troponin was greater than 65. Repeat EKG was not significantly changed. Was seen by cardiology and felt secondary to renal failure patient was not an ideal PCI candidate and being treated medically. However, on 10/04/17, he had a LHC  Which found multiple vessel disease and with one stent placement.  Today he became acutely short of breath and his chest x-ray showed pulmonary edema hence is being transferred to the ICU for BiPAP.    PAST MEDICAL HISTORY :  He  has a past medical history of Anemia, Chronic kidney disease, Coronary atherosclerosis, Diabetes mellitus without complication ([place]), Edema, GERD (gastroesophageal reflux disease), Glaucoma, Hearing loss, Heart murmur, History of hiatal hernia, Hyperlipidemia, Hypertension, Mini stroke ([place]), Mitral insufficiency, Myocardial infarction [place]), and Stroke ([place]) (2011).  PAST SURGICAL HISTORY: He  has a past surgical history that includes Esophagogastroduodenoscopy (egd) with propofol (N/A, 04/25/2016); Cataract extraction w/PHACO (Left, 09/05/2016);  Coronary artery bypass graft (2003); Joint replacement (Right, 2010); Skin cancer excision; Cataract extraction w/PHACO (Right, 09/26/2016); DIALYSIS/PERMA CATHETER INSERTION (N/A, 10/04/2017); and LEFT HEART CATH AND CORONARY ANGIOGRAPHY (N/A, 10/04/2017).  Allergies  Allergen Reactions  . Bee Venom Hives  . Iodinated Diagnostic Agents Rash  . Metrizamide Rash    No current facility-administered medications on file prior to encounter.    Current Outpatient Medications on File Prior to Encounter  Medication Sig  . atorvastatin (LIPITOR) 80 MG tablet Take 1 tablet (80 mg total) by mouth at bedtime.  . carvedilol (COREG) 25 MG tablet Take 25 mg by mouth 2 (two) times daily.   Marland Kitchen donepezil (ARICEPT) 5 MG tablet Take 5 mg by mouth at bedtime.   Marland Kitchen ezetimibe (ZETIA) 10 MG tablet Take 1 tablet (10 mg total) by mouth daily.  . furosemide (LASIX) 40 MG tablet Take 40 mg by mouth daily as needed.   Marland Kitchen glipiZIDE (GLUCOTROL) 5 MG tablet Take 1 tablet (5 mg total) by mouth daily.  . isosorbide mononitrate (IMDUR) 30 MG 24 hr tablet   . JANUVIA 25 MG tablet Take 1 tablet (25 mg total) by mouth daily.  . mometasone (ELOCON) 0.1 % lotion APPLY 4 DROPS INTO EACH EAR DAILY FOR 10 DAYS THEN AS NEEDED FOR DRY SKIN AND ITCHING  . Multiple Vitamin (MULTIVITAMIN) tablet Take 1 tablet by mouth daily.  Marland Kitchen olmesartan (BENICAR) 20 MG tablet Take 20 mg by mouth daily.   . timolol (TIMOPTIC) 0.5 % ophthalmic solution Place 1 drop into both eyes daily.   . TURMERIC PO Take 1,300 mg by mouth.  Marland Kitchen  amoxicillin (AMOXIL) 500 MG capsule TAKE 4 CAPSULES BY MOUTH ONE HOUR PRIOR TO APPT  . aspirin EC 81 MG tablet Take 81 mg by mouth daily.  . digoxin (LANOXIN) 0.125 MG tablet Take 62.5 mcg by mouth every other day.  . metoprolol succinate (TOPROL-XL) 25 MG 24 hr tablet Take 1 tablet (25 mg total) by mouth daily. (Patient not taking: Reported on 09/30/2017)    FAMILY HISTORY:  His family history includes Diabetes in his maternal  grandmother, mother, and sister; Stroke in his mother.  SOCIAL HISTORY: He  reports that he quit smoking about 53 years ago. He has never used smokeless tobacco. He reports that he does not drink alcohol or use drugs.  REVIEW OF SYSTEMS:   Constitutional: Negative for fever and chills.  HENT: Negative for congestion and rhinorrhea.  Eyes: Negative for redness and visual disturbance.  Respiratory: Positive for progressive shortness of breath  Cardiovascular: Negative for chest pain and palpitations.  Gastrointestinal: Negative  for nausea , vomiting and abdominal pain and  Loose stools Genitourinary: Negative for dysuria and urgency.  Endocrine: Denies polyuria, polyphagia and heat intolerance Musculoskeletal: Negative for myalgias and arthralgias.  Skin: Negative for pallor and wound.  Neurological: Negative for dizziness and headaches   SUBJECTIVE:    VITAL SIGNS: BP 120/70   Pulse 80   Temp (!) 97.5 F (36.4 C) (Oral)   Resp (!) 23   Ht 5\' 7"  (1.702 m)   Wt 77.9 kg   SpO2 100%   BMI 26.89 kg/m   HEMODYNAMICS:    VENTILATOR SETTINGS: FiO2 (%):  [50 %] 50 %  INTAKE / OUTPUT: I/O last 3 completed shifts: In: 120 [P.O.:120] Out: 443 [Urine:425; Other:18]  PHYSICAL EXAMINATION: General: Moderate respiratory distress Neuro: Alert and oriented x3 no focal deficits HEENT: PERRLA, mild JVD, trachea midline Cardiovascular: Apical pulse regular, S1-S2, murmur regurg or gallop Lungs: Bilateral breath sounds diminished in the bases with bibasilar crackles, no wheezes Abdomen:  normal bowel sounds in all 4 quadrants Musculoskeletal: Joint deformities, positive range of motion Skin: Warm and dry  LABS:  BMET Recent Labs  Lab 10/06/17 1246 10/07/17 0424 10/07/17 2307  NA 132* 136 132*  K 4.3 4.3 4.3  CL 93* 102 98  CO2 27 27 27   BUN 57* 34* 47*  CREATININE 5.56* 3.52* 3.49*  GLUCOSE 323* 194* 242*    Electrolytes Recent Labs  Lab 10/01/17 0434  10/02/17 0427  10/04/17 2205  10/05/17 1424 10/06/17 1246 10/07/17 0424 10/07/17 2307  CALCIUM 7.8* 8.0*   < >  --    < >  --  8.3* 8.5* 8.9  MG 2.2 2.4  --   --   --   --   --   --   --   PHOS 2.7 4.3  --  4.5  --  4.6 3.3  3.3  --   --    < > = values in this interval not displayed.    CBC Recent Labs  Lab 10/04/17 0511 10/05/17 0434 10/07/17 2307  WBC 13.8* 14.4* 16.8*  HGB 10.0* 10.6* 9.5*  HCT 28.8* 31.0* 28.0*  PLT 315 351 334    Coag's No results for input(s): APTT, INR in the last 168 hours.  Sepsis Markers Recent Labs  Lab 10/01/17 0434 10/02/17 0427  PROCALCITON 4.11 3.14    ABG No results for input(s): PHART, PCO2ART, PO2ART in the last 168 hours.  Liver Enzymes Recent Labs  Lab 10/06/17 1246  ALBUMIN  2.5*    Cardiac Enzymes Recent Labs  Lab 10/07/17 2307  TROPONINI 9.58*    Glucose Recent Labs  Lab 10/06/17 2045 10/07/17 0720 10/07/17 1146 10/07/17 1637 10/07/17 2016 10/07/17 2343  GLUCAP 213* 204* 288* 288* 245* 257*    Imaging Dg Chest Port 1 View  Result Date: 10/07/2017 CLINICAL DATA:  Cough, difficulty breathing EXAM: PORTABLE CHEST 1 VIEW COMPARISON:  10/01/2017 FINDINGS: Right dialysis catheter is in place with the tip in the right atrium. Prior CABG. Cardiomegaly. Diffuse interstitial opacities throughout the lungs compatible with edema. Small bilateral effusions. Left lower lobe atelectasis. Large hiatal hernia. IMPRESSION: Cardiomegaly with diffuse interstitial prominence throughout the lungs compatible with interstitial edema/CHF. Large hiatal hernia. Small bilateral effusions. Electronically Signed   By: Rolm Baptise M.D.   On: 10/07/2017 23:02   STUDIES:  Last 2D echo 09/30/17: LV EF: 35% -   40%   CULTURES: Blood cultures: Negative  ANTIBIOTICS: Was completed  SIGNIFICANT EVENTS: 09/29/2017: Admitted 08/22: Left heart catheterization 8/23: Transferred out of the ICU 8/26: Transferred back to the ICU for acute  pulmonary edema LINES/TUBES: Peripheral IVs Right chest wall PermCath  DISCUSSION: 84 year old male presenting with acute pulmonary edema, acute hypoxic respiratory failure requiring BiPAP, acute renal failure, nausea vomiting and diarrhea secondary to medication side effects  ASSESSMENT Acute hypoxic respiratory failure requiring BiPAP Acute pulmonary edema End-stage renal disease requiring hemodialysis STEMI S/P drug-eluting stent to the ostium of the saphenous vein graft to OM 2 CAD with multivessel disease s/p CABG and PCI with recent stent T2DM Hiatus hernia   PLAN Continues BiPAP and titrate to nasal cannula as tolerated Lasix 40 mg IV x1 now Cycle cardiac enzymes Stat chest x-ray reviewed Rest of the treatment plan unchanged GI and DVT prophylaxis FAMILY  - Updates: Patient and spouse updated at bedside.     Magdalene S. [facility] ANP-BC Pulmonary and Critical Care Medicine [facility] Pager [phone] or [phone]  NB: This document was prepared using Dragon voice recognition software and may include unintentional dictation errors.    10/08/2017, 1:49 AM   I agree with the documented

## 2017-10-08 NOTE — Progress Notes (Signed)
[place] [facility] [place] NAME: Ray Lamb    MR#:  [id number]  DATE OF BIRTH:  23-Oct-1930  SUBJECTIVE:   Patient presented to the hospital due to abdominal pain but was found to have a non-ST elevation MI.  Patient is status post cardiac catheterization with drug-eluting stent to the previous bypass of SVG to OM 2.  Overnight patient developed worsening respiratory failure and was volume overloaded and transferred to the ICU and placed on BiPAP.  Now weaned off BiPAP and has been diuresed and feels better.  Patient's wife and son are at bedside.  REVIEW OF SYSTEMS:    Review of Systems  Constitutional: Negative for chills and fever.  HENT: Negative for congestion and tinnitus.   Eyes: Negative for blurred vision and double vision.  Respiratory: Positive for shortness of breath (Improved. ). Negative for cough and wheezing.   Cardiovascular: Negative for chest pain, orthopnea and PND.  Gastrointestinal: Negative for abdominal pain, diarrhea, nausea and vomiting.  Genitourinary: Negative for dysuria and hematuria.  Neurological: Negative for dizziness, sensory change and focal weakness.  All other systems reviewed and are negative.   Nutrition: Renal/Carb control Tolerating Diet: Yes Tolerating PT: Await Eval.    DRUG ALLERGIES:   Allergies  Allergen Reactions  . Bee Venom Hives  . Iodinated Diagnostic Agents Rash  . Metrizamide Rash    VITALS:  Blood pressure 104/82, pulse (!) 112, temperature 98.6 F (37 C), resp. rate (!) 32, height 5\' 7"  (1.702 m), weight 76.8 kg, SpO2 99 %.  PHYSICAL EXAMINATION:   Physical Exam  GENERAL:  84 y.o.-year-old patient lying in bed in no acute distress.  EYES: Pupils equal, round, reactive to light and accommodation. No scleral icterus. Extraocular muscles intact.  HEENT: Head atraumatic, normocephalic. Oropharynx and nasopharynx clear.  NECK:  Supple, no jugular venous distention. No thyroid  enlargement, no tenderness.  LUNGS: Normal breath sounds bilaterally, no wheezing, bibasilar rales, rhonchi. No use of accessory muscles of respiration.  CARDIOVASCULAR: S1, S2 normal. No murmurs, rubs, or gallops.  ABDOMEN: Soft, nontender, nondistended. Bowel sounds present. No organomegaly or mass.  EXTREMITIES: No cyanosis, clubbing or edema b/l.    NEUROLOGIC: Cranial nerves II through XII are intact. No focal Motor or sensory deficits b/l.   PSYCHIATRIC: The patient is alert and oriented x 3.  SKIN: No obvious rash, lesion, or ulcer.    LABORATORY PANEL:   CBC Recent Labs  Lab 10/07/17 2307  WBC 16.8*  HGB 9.5*  HCT 28.0*  PLT 334   ------------------------------------------------------------------------------------------------------------------  Chemistries  Recent Labs  Lab 10/02/17 0427  10/08/17 0422  NA 128*   < > 135  K 5.3*   < > 5.0  CL 98   < > 97*  CO2 20*   < > 28  GLUCOSE 307*   < > 260*  BUN 57*   < > 50*  CREATININE 3.73*   < > 3.57*  CALCIUM 8.0*   < > 9.1  MG 2.4  --   --    < > = values in this interval not displayed.   ------------------------------------------------------------------------------------------------------------------  Cardiac Enzymes Recent Labs  Lab 10/07/17 2307  TROPONINI 9.58*   ------------------------------------------------------------------------------------------------------------------  RADIOLOGY:  Dg Chest Port 1 View  Result Date: 10/07/2017 CLINICAL DATA:  Cough, difficulty breathing EXAM: PORTABLE CHEST 1 VIEW COMPARISON:  10/01/2017 FINDINGS: Right dialysis catheter is in place with the tip in the right atrium. Prior CABG. Cardiomegaly. Diffuse  interstitial opacities throughout the lungs compatible with edema. Small bilateral effusions. Left lower lobe atelectasis. Large hiatal hernia. IMPRESSION: Cardiomegaly with diffuse interstitial prominence throughout the lungs compatible with interstitial edema/CHF. Large  hiatal hernia. Small bilateral effusions. Electronically Signed   By: Rolm Baptise M.D.   On: 10/07/2017 23:02     ASSESSMENT AND PLAN:   84 year old male with past medical history of previous CVA, hypertension, hyperlipidemia, GERD, glaucoma, diabetes, chronic kidney disease stage IV who presented to the hospital due to abdominal pain and noted to have a non-ST elevation MI.  1.  Non-ST elevation MI- patient is status post cardiac catheterization with drug-eluting stent to the previous bypass graft SVG to OM 2. -Continue aspirin, Plavix, atorvastatin, Imdur  2.  Acute respiratory failure with hypoxia-secondary to CHF/pulmonary edema. - Patient was transferred to the ICU overnight due to worsening respiratory failure and pulmonary edema, given diuretics and has responded to them has improved.   - Off BiPAP now, continue O2 supplementation.  3.  CHF-acute on chronic systolic dysfunction. - he received some IV diuresis and has improved.  Not on scheduled Lasix due to CKD. - Continue Imdur.  4.  Acute on chronic renal failure- patient has CKD secondary to underlying diabetes and hypertension with proteinuria. - Baseline creatinine is around 2.2-2.5.  Patient's creatinine was as high as 6.1 during this hospitalization.  Improved and down down to 3.5, urine output has improved.  No acute need for hemodialysis as per nephrology. - Renal dose meds, avoid nephrotoxins, follow urine output.  5.  Diabetes type 2 without complication-continue sliding scale insulin, Tradjenta.  6.  Hyperlipidemia-continue atorvastatin.  7.  Dementia-continue Aricept.  8.  Glaucoma- cont. timolol eyedrops.   All the records are reviewed and case discussed with Care Management/Social Worker. Management plans discussed with the patient, family and they are in agreement.  CODE STATUS: Limited Code  DVT Prophylaxis: Hep. SQ  TOTAL TIME TAKING CARE OF THIS PATIENT: 30 minutes.   POSSIBLE D/C IN 2-3 DAYS,  DEPENDING ON CLINICAL CONDITION.   Henreitta Leber M.D on 10/08/2017 at 3:34 PM  Between 7am to 6pm - Pager - [phone]  After 6pm go to [email or web address] - Proofreader  [facility]  Office  [phone]  CC: Primary care physician; [name], MD

## 2017-10-09 ENCOUNTER — Inpatient Hospital Stay: Payer: Medicare Other

## 2017-10-09 LAB — BLOOD GAS, ARTERIAL
Acid-Base Excess: 8.1 mmol/L — ABNORMAL HIGH (ref 0.0–2.0)
BICARBONATE: 32.8 mmol/L — AB (ref 20.0–28.0)
Delivery systems: POSITIVE
EXPIRATORY PAP: 8
FIO2: 0.3
Inspiratory PAP: 12
Mechanical Rate: 8
O2 Saturation: 91.1 %
PATIENT TEMPERATURE: 37
PH ART: 7.47 — AB (ref 7.350–7.450)
pCO2 arterial: 45 mmHg (ref 32.0–48.0)
pO2, Arterial: 57 mmHg — ABNORMAL LOW (ref 83.0–108.0)

## 2017-10-09 LAB — CBC WITH DIFFERENTIAL/PLATELET
BASOS ABS: 0.1 10*3/uL (ref 0–0.1)
BASOS PCT: 1 %
Eosinophils Absolute: 0.4 10*3/uL (ref 0–0.7)
Eosinophils Relative: 3 %
HEMATOCRIT: 24.9 % — AB (ref 40.0–52.0)
Hemoglobin: 8.5 g/dL — ABNORMAL LOW (ref 13.0–18.0)
Lymphocytes Relative: 16 %
Lymphs Abs: 2.4 10*3/uL (ref 1.0–3.6)
MCH: 31.1 pg (ref 26.0–34.0)
MCHC: 34.1 g/dL (ref 32.0–36.0)
MCV: 91.1 fL (ref 80.0–100.0)
MONO ABS: 1.5 10*3/uL — AB (ref 0.2–1.0)
Monocytes Relative: 10 %
NEUTROS ABS: 10.4 10*3/uL — AB (ref 1.4–6.5)
NEUTROS PCT: 70 %
Platelets: 295 10*3/uL (ref 150–440)
RBC: 2.73 MIL/uL — AB (ref 4.40–5.90)
RDW: 14.6 % — AB (ref 11.5–14.5)
WBC: 14.8 10*3/uL — ABNORMAL HIGH (ref 3.8–10.6)

## 2017-10-09 LAB — GLUCOSE, CAPILLARY
GLUCOSE-CAPILLARY: 271 mg/dL — AB (ref 70–99)
Glucose-Capillary: 216 mg/dL — ABNORMAL HIGH (ref 70–99)
Glucose-Capillary: 142 mg/dL — ABNORMAL HIGH (ref 70–99)
Glucose-Capillary: 126 mg/dL — ABNORMAL HIGH (ref 70–99)

## 2017-10-09 LAB — BASIC METABOLIC PANEL
Anion gap: 7 (ref 5–15)
BUN: 63 mg/dL — ABNORMAL HIGH (ref 8–23)
CALCIUM: 9.5 mg/dL (ref 8.9–10.3)
CO2: 30 mmol/L (ref 22–32)
Chloride: 97 mmol/L — ABNORMAL LOW (ref 98–111)
Creatinine, Ser: 3.45 mg/dL — ABNORMAL HIGH (ref 0.61–1.24)
GFR, EST AFRICAN AMERICAN: 17 mL/min — AB (ref >60)
GFR, EST NON AFRICAN AMERICAN: 15 mL/min — AB (ref >60)
Glucose, Bld: 221 mg/dL — ABNORMAL HIGH (ref 70–99)
POTASSIUM: 4.7 mmol/L (ref 3.5–5.1)
Sodium: 134 mmol/L — ABNORMAL LOW (ref 135–145)

## 2017-10-09 MED ORDER — FUROSEMIDE 10 MG/ML IJ SOLN
80.0000 mg | Freq: Two times a day (BID) | INTRAMUSCULAR | Status: DC
  Administered 2017-10-09 – 2017-10-11 (×4): 80 mg via INTRAVENOUS
  Filled 2017-10-09 (×4): qty 8

## 2017-10-09 MED ORDER — FUROSEMIDE 10 MG/ML IJ SOLN
80.0000 mg | Freq: Once | INTRAMUSCULAR | Status: AC
  Administered 2017-10-09: 80 mg via INTRAVENOUS

## 2017-10-09 MED ORDER — METOPROLOL TARTRATE 25 MG PO TABS
25.0000 mg | ORAL_TABLET | Freq: Four times a day (QID) | ORAL | Status: DC
  Administered 2017-10-09 – 2017-10-11 (×6): 25 mg via ORAL
  Filled 2017-10-09 (×6): qty 1

## 2017-10-09 NOTE — Progress Notes (Signed)
Inpatient Diabetes Program Recommendations  AACE/ADA: New Consensus Statement on Inpatient Glycemic Control (2019)  Target Ranges:  Prepandial:   less than 140 mg/dL      Peak postprandial:   less than 180 mg/dL (1-2 hours)      Critically ill patients:  140 - 180 mg/dL   Results for KESEAN, SERVISS (MRN [id number]) as of 10/09/2017 09:17  Ref. Range 10/08/2017 12:18 10/08/2017 16:17 10/08/2017 19:57 10/09/2017 07:51  Glucose-Capillary Latest Ref Range: 70 - 99 mg/dL 325 (H) 305 (H) 255 (H) 216 (H)    Diabetes history: DM2 Outpatient Diabetes medications: Glipizide 5 mg daily, Januvia 25 mg daily Current orders for Inpatient glycemic control: Tradjenta 5 mg daily, Novolog 0-15 units TID with meals, Novolog 0-5 units QHS  Inpatient Diabetes Program Recommendations:  Insulin - Basal: Please consider ordering Lantus 7 units Q24H starting now (based on 71.8 kg x 0.1 units). Insulin - Meal Coverage: Please consider ordering Novolog 3 units TID with meals for meal coverage if patient eats at least 50% of meals. (Please add the following Hold Parameters: Hold if pt eats <50% of meal, Hold if pt NPO)  Thanks, Barnie Alderman, RN, MSN, CDE Diabetes Coordinator Inpatient Diabetes Program [phone] (Team Pager from 8am to 5pm)

## 2017-10-09 NOTE — Progress Notes (Signed)
[facility]  ROUNDING NOTE   Subjective:   Shortness of breath, using accessory muscles.   Wife at bedside.   UOP 1530  Creatinine 3.45 (3.57)  Objective:  Vital signs in last 24 hours:  Temp:  [97.8 F (36.6 C)-98.6 F (37 C)] 97.8 F (36.6 C) (08/27 0804) Pulse Rate:  [75-126] 123 (08/27 0900) Resp:  [17-33] 27 (08/27 0900) BP: (104-155)/(59-92) 145/87 (08/27 0900) SpO2:  [96 %-100 %] 100 % (08/27 0700) Weight:  [77.4 kg] 77.4 kg (08/27 0500)  Weight change: 0.6 kg Filed Weights   10/07/17 0341 10/08/17 0500 10/09/17 0500  Weight: 77.9 kg 76.8 kg 77.4 kg    Intake/Output: I/O last 3 completed shifts: In: 40 [P.O.:1130; IV Piggyback:100] Out: 2920 [Urine:2920]   Intake/Output this shift:  No intake/output data recorded.  Physical Exam: General: NAD, sitting up in bed   Head: Normocephalic, atraumatic. Moist oral mucosal membranes  Eyes: Anicteric, PERRL  Neck: Supple, trachea midline  Lungs:  Bilateral crackles, tachypnic  Heart: Regular rate and rhythm  Abdomen:  Soft, nontender,   Extremities: No peripheral edema.  Neurologic: Nonfocal, moving all four extremities  Skin: No lesions  Access: RIJ permcath     Basic Metabolic Panel: Recent Labs  Lab 10/04/17 2205  10/05/17 1424 10/06/17 1246 10/07/17 0424 10/07/17 2307 10/08/17 0422 10/09/17 0547  NA  --    < >  --  132* 136 132* 135 134*  K  --    < >  --  4.3 4.3 4.3 5.0 4.7  CL  --    < >  --  93* 102 98 97* 97*  CO2  --    < >  --  27 27 27 28 30   GLUCOSE  --    < >  --  323* 194* 242* 260* 221*  BUN  --    < >  --  57* 34* 47* 50* 63*  CREATININE  --    < >  --  5.56* 3.52* 3.49* 3.57* 3.45*  CALCIUM  --    < >  --  8.3* 8.5* 8.9 9.1 9.5  PHOS 4.5  --  4.6 3.3  3.3  --   --   --   --    < > = values in this interval not displayed.    Liver Function Tests: Recent Labs  Lab 10/06/17 1246  ALBUMIN 2.5*   No results for input(s): LIPASE, AMYLASE in the last 168  hours. No results for input(s): AMMONIA in the last 168 hours.  CBC: Recent Labs  Lab 10/03/17 0441 10/04/17 0511 10/05/17 0434 10/07/17 2307 10/09/17 0547  WBC 13.2* 13.8* 14.4* 16.8* 14.8*  NEUTROABS  --   --   --   --  10.4*  HGB 8.4* 10.0* 10.6* 9.5* 8.5*  HCT 24.2* 28.8* 31.0* 28.0* 24.9*  MCV 91.4 90.7 91.3 90.9 91.1  PLT 273 315 351 334 295    Cardiac Enzymes: Recent Labs  Lab 10/07/17 2307  TROPONINI 9.58*    BNP: Invalid input(s): POCBNP  CBG: Recent Labs  Lab 10/07/17 2343 10/08/17 1218 10/08/17 1617 10/08/17 1957 10/09/17 0751  GLUCAP 257* 325* 305* 77* 73*    Microbiology: Results for orders placed or performed during the hospital encounter of 09/29/17  Culture, blood (routine x 2)     Status: None   Collection Time: 09/30/17 12:58 AM  Result Value Ref Range Status   Specimen Description BLOOD BLOOD LEFT FOREARM  Final  Special Requests   Final    BOTTLES DRAWN AEROBIC AND ANAEROBIC Blood Culture adequate volume   Culture   Final    NO GROWTH 5 DAYS Performed at [facility], [address] [place], [place] [address]    Report Status 10/05/2017 FINAL  Final  Culture, blood (routine x 2)     Status: None   Collection Time: 09/30/17 12:58 AM  Result Value Ref Range Status   Specimen Description BLOOD RIGHT ARM  Final   Special Requests   Final    BOTTLES DRAWN AEROBIC AND ANAEROBIC Blood Culture adequate volume   Culture   Final    NO GROWTH 5 DAYS Performed at [facility], [address] [place], [place] [address]    Report Status 10/05/2017 FINAL  Final  MRSA PCR Screening     Status: None   Collection Time: 09/30/17  1:47 AM  Result Value Ref Range Status   MRSA by PCR NEGATIVE NEGATIVE Final    Comment:        The GeneXpert MRSA Assay (FDA approved for NASAL specimens only), is one component of a comprehensive MRSA colonization surveillance program. It is not intended to diagnose MRSA infection nor to  guide or monitor treatment for MRSA infections. Performed at [facility], [address] [place], [place] [address]     Coagulation Studies: No results for input(s): LABPROT, INR in the last 72 hours.  Urinalysis: No results for input(s): COLORURINE, LABSPEC, PHURINE, GLUCOSEU, HGBUR, BILIRUBINUR, KETONESUR, PROTEINUR, UROBILINOGEN, NITRITE, LEUKOCYTESUR in the last 72 hours.  Invalid input(s): APPERANCEUR    Imaging: Dg Chest Port 1 View  Result Date: 10/09/2017 CLINICAL DATA:  Pneumonia EXAM: PORTABLE CHEST 1 VIEW COMPARISON:  10/07/2017 FINDINGS: Dual lumen dialysis access catheter unchanged in position in the right atrium. Vascular congestion with bilateral airspace disease shows mild interval improvement. Small bilateral effusions and bibasilar atelectasis remain. IMPRESSION: Improvement in pulmonary edema. Improvement in bibasilar atelectasis. Small bilateral effusions. Electronically Signed   By: Franchot Gallo M.D.   On: 10/09/2017 07:55   Dg Chest Port 1 View  Result Date: 10/07/2017 CLINICAL DATA:  Cough, difficulty breathing EXAM: PORTABLE CHEST 1 VIEW COMPARISON:  10/01/2017 FINDINGS: Right dialysis catheter is in place with the tip in the right atrium. Prior CABG. Cardiomegaly. Diffuse interstitial opacities throughout the lungs compatible with edema. Small bilateral effusions. Left lower lobe atelectasis. Large hiatal hernia. IMPRESSION: Cardiomegaly with diffuse interstitial prominence throughout the lungs compatible with interstitial edema/CHF. Large hiatal hernia. Small bilateral effusions. Electronically Signed   By: Rolm Baptise M.D.   On: 10/07/2017 23:02     Medications:   . sodium chloride 250 mL (10/08/17 1524)  . ceFEPime (MAXIPIME) IV 2 g (10/08/17 1525)   . aspirin EC  81 mg Oral Daily  . atorvastatin  80 mg Oral QHS  . clopidogrel  75 mg Oral Q breakfast  . diphenhydrAMINE  12.5 mg Intravenous Once  . donepezil  5 mg Oral QHS  . famotidine   20 mg Oral Daily  . feeding supplement (NEPRO CARB STEADY)  237 mL Oral TID BM  . heparin injection (subcutaneous)  5,000 Units Subcutaneous Q8H  . insulin aspart  0-15 Units Subcutaneous TID WC  . insulin aspart  0-5 Units Subcutaneous QHS  . isosorbide mononitrate  30 mg Oral Daily  . linagliptin  5 mg Oral Daily  . metoprolol tartrate  25 mg Oral Q6H  . multivitamin  1 tablet Oral QHS  .  multivitamin with minerals  1 tablet Oral Daily  . sodium chloride flush  3 mL Intravenous Q12H  . timolol  1 drop Both Eyes Daily   sodium chloride, acetaminophen **OR** acetaminophen, acetaminophen, bisacodyl, gi cocktail, labetalol, loperamide, Melatonin, ondansetron **OR** ondansetron (ZOFRAN) IV, senna-docusate, sodium chloride flush  Assessment/ Plan:  [name] is a 84 y.o. white male with dementia, hypertension, diabetes, coronary artery disease status post CABG, CVA, pulmonary hypertension, severe mitral regurgitation.   1. Acute renal failure on chronic kidney disease stage IV with hyperkalemia and proteinuria: baseline creatinine of 2.79, GFR of 20 on 09/13/17 Chronic kidney disease secondary to diabetes, hypertension and vascular disease Acute renal failure secondary to prerenal azotemia with diarrhea and GI losses.  No acute indication for dialysis today. Last hemodialysis was on Saturday, 8/24.  Nonoliguric urine output.  - IV furosemide bolus at 80mg  - if no improvement, will proceed with dialysis.   2. Hypertension: blood pressure at goal. Off vasopressors.  - imdur  3. Anemia of chronic kidney disease: hemoglobin 8.5 - no indication for ESA.    LOS: 9 Prajna Vanderpool 8/27/20199:56 AM

## 2017-10-09 NOTE — Progress Notes (Signed)
SUBJECTIVE: Feeling very SOB   Vitals:   10/09/17 1200 10/09/17 1300 10/09/17 1400 10/09/17 1500  BP: (!) 125/59 140/84 139/72 126/73  Pulse: 79 (!) 119 74 74  Resp: 20 (!) 23 (!) 26 17  Temp:      TempSrc:      SpO2: 100% 97% 100%   Weight:      Height:        Intake/Output Summary (Last 24 hours) at 10/09/2017 1509 Last data filed at 10/09/2017 1407 Gross per 24 hour  Intake 870 ml  Output 1680 ml  Net -810 ml    LABS: Basic Metabolic Panel: Recent Labs    10/08/17 0422 10/09/17 0547  NA 135 134*  K 5.0 4.7  CL 97* 97*  CO2 28 30  GLUCOSE 260* 221*  BUN 50* 63*  CREATININE 3.57* 3.45*  CALCIUM 9.1 9.5   Liver Function Tests: No results for input(s): AST, ALT, ALKPHOS, BILITOT, PROT, ALBUMIN in the last 72 hours. No results for input(s): LIPASE, AMYLASE in the last 72 hours. CBC: Recent Labs    10/07/17 2307 10/09/17 0547  WBC 16.8* 14.8*  NEUTROABS  --  10.4*  HGB 9.5* 8.5*  HCT 28.0* 24.9*  MCV 90.9 91.1  PLT 334 295   Cardiac Enzymes: Recent Labs    10/07/17 2307  TROPONINI 9.58*   BNP: Invalid input(s): POCBNP D-Dimer: No results for input(s): DDIMER in the last 72 hours. Hemoglobin A1C: No results for input(s): HGBA1C in the last 72 hours. Fasting Lipid Panel: No results for input(s): CHOL, HDL, LDLCALC, TRIG, CHOLHDL, LDLDIRECT in the last 72 hours. Thyroid Function Tests: No results for input(s): TSH, T4TOTAL, T3FREE, THYROIDAB in the last 72 hours.  Invalid input(s): FREET3 Anemia Panel: No results for input(s): VITAMINB12, FOLATE, FERRITIN, TIBC, IRON, RETICCTPCT in the last 72 hours.   PHYSICAL EXAM General: Well developed, well nourished, in no acute distress HEENT:  Normocephalic and atramatic Neck:  No JVD.  Lungs: Clear bilaterally to auscultation and percussion. Heart: HRRR . Normal S1 and S2 without gallops or murmurs.  Abdomen: Bowel sounds are positive, abdomen soft and non-tender  Msk:  Back normal, normal gait.  Normal strength and tone for age. Extremities: No clubbing, cyanosis or edema.   Neuro: Alert and oriented X 3. Psych:  Good affect, responds appropriately  TELEMETRY: sinus tachycardia  ASSESSMENT AND PLAN: CHF/NSTEMI/Respiratory failure/Severe LV systolic dysfunction due to MI and sinus tachycardia/Renal failure. May need 1 dose of digoxin 0.25 if metoprolol 25 qid doesnot lowe heart rate and need more dialysis. Active Problems:   Acute hypoxemic respiratory failure (HCC)    Amarrah Meinhart A, MD, [facility] 10/09/2017 3:09 PM

## 2017-10-09 NOTE — Progress Notes (Signed)
[place] at [place] NAME: Ray Lamb    MR#:  [id number]  DATE OF BIRTH:  1930-09-15  SUBJECTIVE:   Patient presented to the hospital due to abdominal pain but was found to have a non-ST elevation MI.  Patient is status post cardiac catheterization with drug-eluting stent to the previous bypass of SVG to OM 2.    She was weaned off BiPAP yesterday but back on it this morning.  Chest x-ray this morning did not show any pulmonary edema or CHF.  Patient received 1 dose of high-dose IV Lasix and responded well to it.  Now weaned off BiPAP again.  Patient's son is at bedside.  Patient denies any shortness of breath presently.  REVIEW OF SYSTEMS:    Review of Systems  Constitutional: Negative for chills and fever.  HENT: Negative for congestion and tinnitus.   Eyes: Negative for blurred vision and double vision.  Respiratory: Positive for shortness of breath (Improved. ). Negative for cough and wheezing.   Cardiovascular: Negative for chest pain, orthopnea and PND.  Gastrointestinal: Negative for abdominal pain, diarrhea, nausea and vomiting.  Genitourinary: Negative for dysuria and hematuria.  Neurological: Negative for dizziness, sensory change and focal weakness.  All other systems reviewed and are negative.   Nutrition: Renal/Carb control Tolerating Diet: Yes Tolerating PT: Await Eval.    DRUG ALLERGIES:   Allergies  Allergen Reactions  . Bee Venom Hives  . Iodinated Diagnostic Agents Rash  . Metrizamide Rash    VITALS:  Blood pressure 126/73, pulse 74, temperature 97.8 F (36.6 C), temperature source Oral, resp. rate 17, height 5\' 7"  (1.702 m), weight 77.4 kg, SpO2 100 %.  PHYSICAL EXAMINATION:   Physical Exam  GENERAL:  84 y.o.-year-old patient lying in bed in no acute distress.  EYES: Pupils equal, round, reactive to light and accommodation. No scleral icterus. Extraocular muscles intact.  HEENT: Head atraumatic,  normocephalic. Oropharynx and nasopharynx clear.  NECK:  Supple, no jugular venous distention. No thyroid enlargement, no tenderness.  LUNGS: Normal breath sounds bilaterally, no wheezing, bibasilar rales, rhonchi. No use of accessory muscles of respiration.  CARDIOVASCULAR: S1, S2 normal. No murmurs, rubs, or gallops.  ABDOMEN: Soft, nontender, nondistended. Bowel sounds present. No organomegaly or mass.  EXTREMITIES: No cyanosis, clubbing or edema b/l.    NEUROLOGIC: Cranial nerves II through XII are intact. No focal Motor or sensory deficits b/l. Globally weak.    PSYCHIATRIC: The patient is alert and oriented x 3.  SKIN: No obvious rash, lesion, or ulcer.    LABORATORY PANEL:   CBC Recent Labs  Lab 10/09/17 0547  WBC 14.8*  HGB 8.5*  HCT 24.9*  PLT 295   ------------------------------------------------------------------------------------------------------------------  Chemistries  Recent Labs  Lab 10/09/17 0547  NA 134*  K 4.7  CL 97*  CO2 30  GLUCOSE 221*  BUN 63*  CREATININE 3.45*  CALCIUM 9.5   ------------------------------------------------------------------------------------------------------------------  Cardiac Enzymes Recent Labs  Lab 10/07/17 2307  TROPONINI 9.58*   ------------------------------------------------------------------------------------------------------------------  RADIOLOGY:  Dg Chest Port 1 View  Result Date: 10/09/2017 CLINICAL DATA:  Pneumonia EXAM: PORTABLE CHEST 1 VIEW COMPARISON:  10/07/2017 FINDINGS: Dual lumen dialysis access catheter unchanged in position in the right atrium. Vascular congestion with bilateral airspace disease shows mild interval improvement. Small bilateral effusions and bibasilar atelectasis remain. IMPRESSION: Improvement in pulmonary edema. Improvement in bibasilar atelectasis. Small bilateral effusions. Electronically Signed   By: Franchot Gallo M.D.   On: 10/09/2017 07:55  Dg Chest Port 1 View  Result  Date: 10/07/2017 CLINICAL DATA:  Cough, difficulty breathing EXAM: PORTABLE CHEST 1 VIEW COMPARISON:  10/01/2017 FINDINGS: Right dialysis catheter is in place with the tip in the right atrium. Prior CABG. Cardiomegaly. Diffuse interstitial opacities throughout the lungs compatible with edema. Small bilateral effusions. Left lower lobe atelectasis. Large hiatal hernia. IMPRESSION: Cardiomegaly with diffuse interstitial prominence throughout the lungs compatible with interstitial edema/CHF. Large hiatal hernia. Small bilateral effusions. Electronically Signed   By: Rolm Baptise M.D.   On: 10/07/2017 23:02     ASSESSMENT AND PLAN:   84 year old male with past medical history of previous CVA, hypertension, hyperlipidemia, GERD, glaucoma, diabetes, chronic kidney disease stage IV who presented to the hospital due to abdominal pain and noted to have a non-ST elevation MI.  1.  Non-ST elevation MI- patient is status post cardiac catheterization with drug-eluting stent to the previous bypass graft SVG to OM 2. -Continue aspirin, Plavix, atorvastatin, Imdur  2.  Acute respiratory failure with hypoxia-secondary to CHF/pulmonary edema. -Still requiring intermittent BiPAP.  Given 1 dose of high-dose IV Lasix and responded well to it.  Continue to follow renal function and response to Lasix.  Off BiPAP now continue O2 supplementation as tolerated.  3.  CHF-acute on chronic systolic dysfunction. -Patient receiving pulse doses of high-dose IV Lasix and responding to it.  Discussed with nephrology and may need high-dose torsemide scheduled. - Continue Imdur.  4.  Acute on chronic renal failure- patient has CKD secondary to underlying diabetes and hypertension with proteinuria. - Baseline creatinine is around 2.2-2.5.  Patient's creatinine was as high as 6.1 during this hospitalization.  Improved and down down to 3.5, urine output has improved.  No acute need for hemodialysis as per nephrology. - Renal dose meds,  avoid nephrotoxins, follow urine output.    5.  Diabetes type 2 without complication-continue sliding scale insulin, Tradjenta. - BS stable.   6.  Hyperlipidemia-continue atorvastatin.  7.  Dementia-continue Aricept.  8.  Glaucoma- cont. timolol eyedrops.   All the records are reviewed and case discussed with Care Management/Social Worker. Management plans discussed with the patient, family and they are in agreement.  CODE STATUS: Limited Code  DVT Prophylaxis: Hep. SQ  TOTAL TIME TAKING CARE OF THIS PATIENT: 30 minutes.   POSSIBLE D/C IN 2-3 DAYS, DEPENDING ON CLINICAL CONDITION.   Henreitta Leber M.D on 10/09/2017 at 3:39 PM  Between 7am to 6pm - Pager - [phone]  After 6pm go to [email or web address] - Proofreader  [facility]  Office  [phone]  CC: Primary care physician; [name], MD

## 2017-10-09 NOTE — Progress Notes (Signed)
Name: Ray Lamb MRN: [id number] DOB: 03-31-1930     CONSULTATION DATE: 09/29/2017  Subjective & Objectives: On/Off BiPAP, responding to diuresis and concern for microaspirations.  PAST MEDICAL HISTORY :   has a past medical history of Anemia, Chronic kidney disease, Coronary atherosclerosis, Diabetes mellitus without complication ([place]), Edema, GERD (gastroesophageal reflux disease), Glaucoma, Hearing loss, Heart murmur, History of hiatal hernia, Hyperlipidemia, Hypertension, Mini stroke ([place]), Mitral insufficiency, Myocardial infarction [facility]), and Stroke ([place]) (2011).  has a past surgical history that includes Esophagogastroduodenoscopy (egd) with propofol (N/A, 04/25/2016); Cataract extraction w/PHACO (Left, 09/05/2016); Coronary artery bypass graft (2003); Joint replacement (Right, 2010); Skin cancer excision; Cataract extraction w/PHACO (Right, 09/26/2016); DIALYSIS/PERMA CATHETER INSERTION (N/A, 10/04/2017); and LEFT HEART CATH AND CORONARY ANGIOGRAPHY (N/A, 10/04/2017). Prior to Admission medications   Medication Sig Start Date End Date Taking? Authorizing Provider  atorvastatin (LIPITOR) 80 MG tablet Take 1 tablet (80 mg total) by mouth at bedtime. 02/26/17  Yes Crissman, Jeannette How, MD  carvedilol (COREG) 25 MG tablet Take 25 mg by mouth 2 (two) times daily.  06/28/17 06/28/18 Yes [provider]  donepezil (ARICEPT) 5 MG tablet Take 5 mg by mouth at bedtime.  03/22/16 09/30/17 Yes [provider]  ezetimibe (ZETIA) 10 MG tablet Take 1 tablet (10 mg total) by mouth daily. 02/26/17  Yes Crissman, Jeannette How, MD  furosemide (LASIX) 40 MG tablet Take 40 mg by mouth daily as needed.  11/17/15  Yes [provider]  glipiZIDE (GLUCOTROL) 5 MG tablet Take 1 tablet (5 mg total) by mouth daily. 02/26/17  Yes [name], MD  isosorbide mononitrate (IMDUR) 30 MG 24 hr tablet  05/17/15  Yes [provider]  JANUVIA 25 MG tablet Take 1 tablet (25 mg total) by mouth daily. 02/26/17   Yes Crissman, Mark A, MD  mometasone (ELOCON) 0.1 % lotion APPLY 4 DROPS INTO EACH EAR DAILY FOR 10 DAYS THEN AS NEEDED FOR DRY SKIN AND ITCHING 04/23/15  Yes [provider]  Multiple Vitamin (MULTIVITAMIN) tablet Take 1 tablet by mouth daily.   Yes [provider]  olmesartan (BENICAR) 20 MG tablet Take 20 mg by mouth daily.    Yes [provider]  timolol (TIMOPTIC) 0.5 % ophthalmic solution Place 1 drop into both eyes daily.    Yes [provider]  TURMERIC PO Take 1,300 mg by mouth.   Yes [provider]  amoxicillin (AMOXIL) 500 MG capsule TAKE 4 CAPSULES BY MOUTH ONE HOUR PRIOR TO APPT 04/02/17   [provider]  aspirin EC 81 MG tablet Take 81 mg by mouth daily.    [provider]  digoxin (LANOXIN) 0.125 MG tablet Take 62.5 mcg by mouth every other day. 04/18/17   [provider]  metoprolol succinate (TOPROL-XL) 25 MG 24 hr tablet Take 1 tablet (25 mg total) by mouth daily. Patient not taking: Reported on 09/30/2017 02/26/17   [name], MD   Allergies  Allergen Reactions  . Bee Venom Hives  . Iodinated Diagnostic Agents Rash  . Metrizamide Rash    FAMILY HISTORY:  family history includes Diabetes in his maternal grandmother, mother, and sister; Stroke in his mother. SOCIAL HISTORY:  reports that he quit smoking about 53 years ago. He has never used smokeless tobacco. He reports that he does not drink alcohol or use drugs.  REVIEW OF SYSTEMS:   Unable to obtain due to critical illness   VITAL SIGNS: Temp:  [97.8 F (36.6 C)-97.9  F (36.6 C)] 97.8 F (36.6 C) (08/27 0804) Pulse Rate:  [74-126] 74 (08/27 1500) Resp:  [17-31] 17 (08/27 1500) BP: (117-155)/(59-92) 126/73 (08/27 1500) SpO2:  [96 %-100 %] 100 % (08/27 1400) Weight:  [77.4 kg] 77.4 kg (08/27 0500)  Physical Examination:  Awake and oriented with no acute neurological deficits Tolerating nasal cannula, no distress, able to talk in full  sentences, bilateral equal air entry with bibasilar fine crackles S1 & S2 are audible with no murmur Benign abdominal exam was normal peristalsis Bilateral pedal leg edema   ASSESSMENT / PLAN: Acute respiratory failure (improved) off BiPAP and tolerating nasal cannula -Monitor ABG and work of breathing. -BiPAP PRN  Pneumonia and atelectasis.  (improved) bibasilar airspace disease. -Empiric cefepime.  Monitor CXR + CBC + FiO2 -MRSA PCR negative  AKI on CKD stage IV with hyperkalemia proteinuria.  RRT as per renal -Management as per nephrology last dialysis was on 8/24. Plan to try Lasix and proceed to RRT if no improvement.  CHF with pulmonary edema (improved) and recent STEMI status post DES on 10/04/2017.  Baseline CAD status post CABG and PCI, on dual antiplatelet aspirin + Plavix. Echo on 09/2017 LVEF 35 to 40% with anteroseptal and apical hypokinesis. -Management as per cardiology -Diuresis/RRT  Diabetes mellitus -Glycemic control  Anemia of chronic disease -Keep hemoglobin more than 8 g/dL  Questionable dysphagia with recurrent silent microaspiration -NPO except meds and follow with swallowing evaluation  CODE STATUS is to DO NOT INTUBATE  DVT & GI prophylaxis.  Continue with supportive care Patient and the family were updated at the bedside and they agreed to the plan of care  Critical care time 35 minutes

## 2017-10-10 ENCOUNTER — Inpatient Hospital Stay: Payer: Medicare Other

## 2017-10-10 DIAGNOSIS — I214 Non-ST elevation (NSTEMI) myocardial infarction: Principal | ICD-10-CM

## 2017-10-10 DIAGNOSIS — Z515 Encounter for palliative care: Secondary | ICD-10-CM

## 2017-10-10 DIAGNOSIS — Z7189 Other specified counseling: Secondary | ICD-10-CM

## 2017-10-10 LAB — GLUCOSE, CAPILLARY
GLUCOSE-CAPILLARY: 113 mg/dL — AB (ref 70–99)
Glucose-Capillary: 140 mg/dL — ABNORMAL HIGH (ref 70–99)
Glucose-Capillary: 251 mg/dL — ABNORMAL HIGH (ref 70–99)
Glucose-Capillary: 243 mg/dL — ABNORMAL HIGH (ref 70–99)

## 2017-10-10 LAB — BLOOD GAS, ARTERIAL
Acid-Base Excess: 9 mmol/L — ABNORMAL HIGH (ref 0.0–2.0)
Bicarbonate: 33.5 mmol/L — ABNORMAL HIGH (ref 20.0–28.0)
FIO2: 0.28
O2 Saturation: 97.9 %
PATIENT TEMPERATURE: 37
PO2 ART: 95 mmHg (ref 83.0–108.0)
pCO2 arterial: 45 mmHg (ref 32.0–48.0)
pH, Arterial: 7.48 — ABNORMAL HIGH (ref 7.350–7.450)

## 2017-10-10 LAB — RENAL FUNCTION PANEL
Albumin: 2.6 g/dL — ABNORMAL LOW (ref 3.5–5.0)
Anion gap: 14 (ref 5–15)
BUN: 69 mg/dL — AB (ref 8–23)
CHLORIDE: 90 mmol/L — AB (ref 98–111)
CO2: 31 mmol/L (ref 22–32)
Calcium: 9.7 mg/dL (ref 8.9–10.3)
Creatinine, Ser: 3.33 mg/dL — ABNORMAL HIGH (ref 0.61–1.24)
GFR calc Af Amer: 18 mL/min — ABNORMAL LOW (ref >60)
GFR, EST NON AFRICAN AMERICAN: 16 mL/min — AB (ref >60)
Glucose, Bld: 144 mg/dL — ABNORMAL HIGH (ref 70–99)
POTASSIUM: 4.2 mmol/L (ref 3.5–5.1)
Phosphorus: 3.5 mg/dL (ref 2.5–4.6)
Sodium: 135 mmol/L (ref 135–145)

## 2017-10-10 LAB — CBC WITH DIFFERENTIAL/PLATELET
BASOS ABS: 0.1 10*3/uL (ref 0–0.1)
Basophils Relative: 1 %
Eosinophils Absolute: 0.4 10*3/uL (ref 0–0.7)
Eosinophils Relative: 3 %
HCT: 26.7 % — ABNORMAL LOW (ref 40.0–52.0)
Hemoglobin: 8.8 g/dL — ABNORMAL LOW (ref 13.0–18.0)
LYMPHS PCT: 21 %
Lymphs Abs: 2.8 10*3/uL (ref 1.0–3.6)
MCH: 30.1 pg (ref 26.0–34.0)
MCHC: 32.9 g/dL (ref 32.0–36.0)
MCV: 91.5 fL (ref 80.0–100.0)
Monocytes Absolute: 1.3 10*3/uL — ABNORMAL HIGH (ref 0.2–1.0)
Monocytes Relative: 10 %
Neutro Abs: 8.7 10*3/uL — ABNORMAL HIGH (ref 1.4–6.5)
Neutrophils Relative %: 65 %
Platelets: 365 10*3/uL (ref 150–440)
RBC: 2.91 MIL/uL — AB (ref 4.40–5.90)
RDW: 15 % — ABNORMAL HIGH (ref 11.5–14.5)
WBC: 13.3 10*3/uL — ABNORMAL HIGH (ref 3.8–10.6)

## 2017-10-10 NOTE — Progress Notes (Signed)
Pt being transferred to room 248. Report called to Caryl Pina, Therapist, sports. Pt and belongings transferred to room 248 without incident.

## 2017-10-10 NOTE — Progress Notes (Addendum)
[place] at [place] NAME: Ray Lamb    MR#:  [id number]  DATE OF BIRTH:  Nov 12, 1930  SUBJECTIVE:   Patient presented to the hospital due to abdominal pain but was found to have a non-ST elevation MI.  Patient is status post cardiac catheterization with drug-eluting stent to the previous bypass of SVG to OM 2.    Patient is on oxygen by nasal cannula 2 L, off BiPAP.  He has no complaints.  REVIEW OF SYSTEMS:    Review of Systems  Constitutional: Negative for chills and fever.  HENT: Negative for congestion and tinnitus.   Eyes: Negative for blurred vision and double vision.  Respiratory: Positive for shortness of breath (Improved. ). Negative for cough and wheezing.   Cardiovascular: Negative for chest pain, orthopnea and PND.  Gastrointestinal: Negative for abdominal pain, diarrhea, nausea and vomiting.  Genitourinary: Negative for dysuria and hematuria.  Neurological: Negative for dizziness, sensory change and focal weakness.  All other systems reviewed and are negative.   Nutrition: Renal/Carb control Tolerating Diet: Yes Tolerating PT: Await Eval.    DRUG ALLERGIES:   Allergies  Allergen Reactions  . Bee Venom Hives  . Iodinated Diagnostic Agents Rash  . Metrizamide Rash    VITALS:  Blood pressure 125/66, pulse 75, temperature 97.9 F (36.6 C), temperature source Oral, resp. rate 17, height 5\' 7"  (1.702 m), weight 73.2 kg, SpO2 96 %.  PHYSICAL EXAMINATION:   Physical Exam  GENERAL:  84 y.o.-year-old patient lying in bed in no acute distress.  EYES: Pupils equal, round, reactive to light and accommodation. No scleral icterus. Extraocular muscles intact.  HEENT: Head atraumatic, normocephalic. Oropharynx and nasopharynx clear.  NECK:  Supple, no jugular venous distention. No thyroid enlargement, no tenderness.  LUNGS: Normal breath sounds bilaterally, no wheezing, bibasilar rales, rhonchi. No use of accessory muscles of  respiration.  CARDIOVASCULAR: S1, S2 normal. No murmurs, rubs, or gallops.  ABDOMEN: Soft, nontender, nondistended. Bowel sounds present. No organomegaly or mass.  EXTREMITIES: No cyanosis, clubbing or edema b/l.    NEUROLOGIC: Cranial nerves II through XII are intact. No focal Motor or sensory deficits b/l. Globally weak.    PSYCHIATRIC: The patient is alert and oriented x 3.  SKIN: No obvious rash, lesion, or ulcer.    LABORATORY PANEL:   CBC Recent Labs  Lab 10/10/17 0608  WBC 13.3*  HGB 8.8*  HCT 26.7*  PLT 365   ------------------------------------------------------------------------------------------------------------------  Chemistries  Recent Labs  Lab 10/10/17 0608  NA 135  K 4.2  CL 90*  CO2 31  GLUCOSE 144*  BUN 69*  CREATININE 3.33*  CALCIUM 9.7   ------------------------------------------------------------------------------------------------------------------  Cardiac Enzymes Recent Labs  Lab 10/07/17 2307  TROPONINI 9.58*   ------------------------------------------------------------------------------------------------------------------  RADIOLOGY:  Dg Chest Port 1 View  Result Date: 10/10/2017 CLINICAL DATA:  Congestive failure EXAM: PORTABLE CHEST 1 VIEW COMPARISON:  10/09/2017 FINDINGS: Cardiac shadow is stable. Postsurgical changes are noted. Dialysis catheter is again seen and stable. Mild vascular congestion is noted although somewhat improved when compared with the prior exam. Persistent effusions are noted left greater than right. Hiatal hernia is again seen. IMPRESSION: Slight improvement in vascular congestion. Small effusions are again noted. Electronically Signed   By: Inez Catalina M.D.   On: 10/10/2017 10:07   Dg Chest Port 1 View  Result Date: 10/09/2017 CLINICAL DATA:  Pneumonia EXAM: PORTABLE CHEST 1 VIEW COMPARISON:  10/07/2017 FINDINGS: Dual lumen dialysis access catheter unchanged in  position in the right atrium. Vascular congestion  with bilateral airspace disease shows mild interval improvement. Small bilateral effusions and bibasilar atelectasis remain. IMPRESSION: Improvement in pulmonary edema. Improvement in bibasilar atelectasis. Small bilateral effusions. Electronically Signed   By: Franchot Gallo M.D.   On: 10/09/2017 07:55     ASSESSMENT AND PLAN:   84 year old male with past medical history of previous CVA, hypertension, hyperlipidemia, GERD, glaucoma, diabetes, chronic kidney disease stage IV who presented to the hospital due to abdominal pain and noted to have a non-ST elevation MI.  1.  Non-ST elevation MI- patient is status post cardiac catheterization with drug-eluting stent to the previous bypass graft SVG to OM 2. -Continue aspirin, Plavix, atorvastatin, Imdur  2.  Acute respiratory failure with hypoxia-secondary to CHF/pulmonary edema. Off BiPAP.  Given 1 dose of high-dose IV Lasix and responded well to it.  Continue to follow renal function and response to Lasix.  Off BiPAP now continue O2 supplementation as tolerated.  3.  CHF-acute on chronic systolic dysfunction. LV EF: 35% -   40% -Patient receiving pulse doses of high-dose IV Lasix and responding to it.  Lasix 80 mg IV, transition to PO torsemide later today per Dr. Juleen China. - Continue Imdur.  4.  Acute on chronic renal failure- patient has CKD secondary to underlying diabetes and hypertension with proteinuria. - Baseline creatinine is around 2.2-2.5.  Patient's creatinine was as high as 6.1 during this hospitalization.  Improved and down down to 3.33, urine output has improved.  No acute need for hemodialysis as per nephrology. - Renal dose meds, avoid nephrotoxins, follow urine output.    5.  Diabetes type 2 without complication-continue sliding scale insulin, Tradjenta. - BS stable.   6.  Hyperlipidemia-continue atorvastatin.  7.  Dementia-continue Aricept.  8.  Glaucoma- cont. timolol eyedrops.  Discussed with Dr. Juleen China. All the  records are reviewed and case discussed with Care Management/Social Worker. Management plans discussed with the patient, family and they are in agreement.  CODE STATUS: Limited Code  DVT Prophylaxis: Hep. SQ  TOTAL TIME TAKING CARE OF THIS PATIENT: 35 minutes.   POSSIBLE D/C IN 2-3 DAYS, DEPENDING ON CLINICAL CONDITION.   Demetrios Loll M.D on 10/10/2017 at 12:58 PM  Between 7am to 6pm - Pager - [phone]  After 6pm go to [email or web address] - Proofreader  [facility]  Office  [phone]  CC: Primary care physician; [name], MD

## 2017-10-10 NOTE — Progress Notes (Signed)
Nutrition Follow-up  DOCUMENTATION CODES:   Not applicable  INTERVENTION:  Continue Nepro Shake po TID, each supplement provides 425 kcal and 19 grams protein.  Continue Rena-vite.  NUTRITION DIAGNOSIS:   Inadequate oral intake related to decreased appetite, other (see comment)(hiatal hernia) as evidenced by per patient/family report.  Ongoing - addressing with ONS.  GOAL:   Patient will meet greater than or equal to 90% of their needs  Progressing.  MONITOR:   PO intake, Supplement acceptance, Diet advancement, Labs, Weight trends, I & O's  REASON FOR ASSESSMENT:   Consult Poor PO  ASSESSMENT:   84 year old male with PMHx of Alzheimer's mild dementia, coronary atherosclerosis, HLD, anemia, mitral insufficiency, GERD, glaucoma, hx MI, hx CABG x4 2003,  HTN, hiatal hernia, DM, hx CVA 2011, CKD stage IV, hearing loss admitted with hypotension now resolved, acute hypoxic respiratory failure now resolved, hyponatremia, acute renal failure on CKD stage IV, NSTEMI.   -Underwent cardiac catheterization on 8/22. Successful PCI/DES to ostium of saphenous vein graft to OM 2.  -Last HD was 8/24. Plan is to keep PermCath in for now. -Patient was made NPO on 8/27 due to concern for microaspiration. -Diet advanced to dysphagia 3 with thin liquids today following SLP evaluation. No straws per SLP.  Met with patient at bedside. He reports his appetite remains somewhat decreased. Per chart he had been finishing 60-100% of his meals before being made NPO. Patient reports he still enjoys the Nepro and has been drinking them TID.  Medications reviewed and include: Lasix 80 mg Q12hrs IV, Novolog 0-15 units TID, Novolog 0-5 units QHS, Rena-vite QHS, cefepime.  Labs reviewed: CBG 113-271 past 24 hrs, Chloride 90, BUN 69, Creatinine 3.33. Potassium and Phosphorus WNL.  Discussed with RN.  Diet Order:   Diet Order            DIET DYS 3 Room service appropriate? Yes with Assist; Fluid  consistency: Thin  Diet effective now              EDUCATION NEEDS:   Education needs have been addressed  Skin:  Skin Assessment: Reviewed RN Assessment(ecchymosis)  Last BM:  10/03/2017 - medium type 7  Height:   Ht Readings from Last 1 Encounters:  10/05/17 _0  (1.702 m)    Weight:   Wt Readings from Last 1 Encounters:  10/10/17 73.2 kg    Ideal Body Weight:  64.5 kg  BMI:  Body mass index is 25.28 kg/m.  Estimated Nutritional Needs:   Kcal:  1755-2025 (MSJ x 1.3-1.5)  Protein:  85-105 grams (1.2-1.5 grams/kg)  Fluid:  UOP + 1 L  Willey Blade, MS, RD, LDN Office: [phone] Pager: [phone] After Hours/Weekend Pager: [phone]

## 2017-10-10 NOTE — Progress Notes (Signed)
[facility]  ROUNDING NOTE   Subjective:   UOP 3285  Furosemide 80mg  IV q12  Pending swallow evaluation  Creatinine 3.33 (3.45) (3.57)  Objective:  Vital signs in last 24 hours:  Temp:  [97.6 F (36.4 C)-98.2 F (36.8 C)] 97.9 F (36.6 C) (08/28 0800) Pulse Rate:  [66-119] 75 (08/28 0900) Resp:  [17-26] 17 (08/28 0900) BP: (105-140)/(56-89) 125/66 (08/28 0500) SpO2:  [91 %-100 %] 96 % (08/28 0900) Weight:  [73.2 kg] 73.2 kg (08/28 0451)  Weight change: -4.2 kg Filed Weights   10/08/17 0500 10/09/17 0500 10/10/17 0451  Weight: 76.8 kg 77.4 kg 73.2 kg    Intake/Output: I/O last 3 completed shifts: In: 48 [P.O.:370; IV Piggyback:100] Out: 3465 [Urine:3465]   Intake/Output this shift:  Total I/O In: 112.4 [I.V.:15.9; IV Piggyback:96.4] Out: 375 [Urine:375]  Physical Exam: General: NAD, sitting up in bed   Head: Normocephalic, atraumatic. Moist oral mucosal membranes  Eyes: Anicteric, PERRL  Neck: Supple, trachea midline  Lungs:  Bilateral crackles, tachypnic  Heart: Regular rate and rhythm  Abdomen:  Soft, nontender,   Extremities: No peripheral edema.  Neurologic: Nonfocal, moving all four extremities  Skin: No lesions  Access: RIJ permcath     Basic Metabolic Panel: Recent Labs  Lab 10/04/17 2205  10/05/17 1424 10/06/17 1246 10/07/17 0424 10/07/17 2307 10/08/17 0422 10/09/17 0547 10/10/17 8938  NA  --    < >  --  132* 136 132* 135 134* 135  K  --    < >  --  4.3 4.3 4.3 5.0 4.7 4.2  CL  --    < >  --  93* 102 98 97* 97* 90*  CO2  --    < >  --  27 27 27 28 30 31   GLUCOSE  --    < >  --  323* 194* 242* 260* 221* 144*  BUN  --    < >  --  57* 34* 47* 50* 63* 69*  CREATININE  --    < >  --  5.56* 3.52* 3.49* 3.57* 3.45* 3.33*  CALCIUM  --    < >  --  8.3* 8.5* 8.9 9.1 9.5 9.7  PHOS 4.5  --  4.6 3.3  3.3  --   --   --   --  3.5   < > = values in this interval not displayed.    Liver Function Tests: Recent Labs  Lab  10/06/17 1246 10/10/17 0608  ALBUMIN 2.5* 2.6*   No results for input(s): LIPASE, AMYLASE in the last 168 hours. No results for input(s): AMMONIA in the last 168 hours.  CBC: Recent Labs  Lab 10/04/17 0511 10/05/17 0434 10/07/17 2307 10/09/17 0547 10/10/17 0608  WBC 13.8* 14.4* 16.8* 14.8* 13.3*  NEUTROABS  --   --   --  10.4* 8.7*  HGB 10.0* 10.6* 9.5* 8.5* 8.8*  HCT 28.8* 31.0* 28.0* 24.9* 26.7*  MCV 90.7 91.3 90.9 91.1 91.5  PLT 315 351 334 295 365    Cardiac Enzymes: Recent Labs  Lab 10/07/17 2307  TROPONINI 9.58*    BNP: Invalid input(s): POCBNP  CBG: Recent Labs  Lab 10/09/17 0751 10/09/17 1115 10/09/17 1623 10/09/17 2215 10/10/17 0752  GLUCAP 216* 271* 142* 126* 140*    Microbiology: Results for orders placed or performed during the hospital encounter of 09/29/17  Culture, blood (routine x 2)     Status: None   Collection Time: 09/30/17 12:58 AM  Result Value Ref Range Status   Specimen Description BLOOD BLOOD LEFT FOREARM  Final   Special Requests   Final    BOTTLES DRAWN AEROBIC AND ANAEROBIC Blood Culture adequate volume   Culture   Final    NO GROWTH 5 DAYS Performed at [facility], [address] [place], [place] [address]    Report Status 10/05/2017 FINAL  Final  Culture, blood (routine x 2)     Status: None   Collection Time: 09/30/17 12:58 AM  Result Value Ref Range Status   Specimen Description BLOOD RIGHT ARM  Final   Special Requests   Final    BOTTLES DRAWN AEROBIC AND ANAEROBIC Blood Culture adequate volume   Culture   Final    NO GROWTH 5 DAYS Performed at [facility], [address] [place], [place] [address]    Report Status 10/05/2017 FINAL  Final  MRSA PCR Screening     Status: None   Collection Time: 09/30/17  1:47 AM  Result Value Ref Range Status   MRSA by PCR NEGATIVE NEGATIVE Final    Comment:        The GeneXpert MRSA Assay (FDA approved for NASAL specimens only), is one component of  a comprehensive MRSA colonization surveillance program. It is not intended to diagnose MRSA infection nor to guide or monitor treatment for MRSA infections. Performed at [facility], [address] [place], [place] [address]     Coagulation Studies: No results for input(s): LABPROT, INR in the last 72 hours.  Urinalysis: No results for input(s): COLORURINE, LABSPEC, PHURINE, GLUCOSEU, HGBUR, BILIRUBINUR, KETONESUR, PROTEINUR, UROBILINOGEN, NITRITE, LEUKOCYTESUR in the last 72 hours.  Invalid input(s): APPERANCEUR    Imaging: Dg Chest Port 1 View  Result Date: 10/10/2017 CLINICAL DATA:  Congestive failure EXAM: PORTABLE CHEST 1 VIEW COMPARISON:  10/09/2017 FINDINGS: Cardiac shadow is stable. Postsurgical changes are noted. Dialysis catheter is again seen and stable. Mild vascular congestion is noted although somewhat improved when compared with the prior exam. Persistent effusions are noted left greater than right. Hiatal hernia is again seen. IMPRESSION: Slight improvement in vascular congestion. Small effusions are again noted. Electronically Signed   By: Inez Catalina M.D.   On: 10/10/2017 10:07   Dg Chest Port 1 View  Result Date: 10/09/2017 CLINICAL DATA:  Pneumonia EXAM: PORTABLE CHEST 1 VIEW COMPARISON:  10/07/2017 FINDINGS: Dual lumen dialysis access catheter unchanged in position in the right atrium. Vascular congestion with bilateral airspace disease shows mild interval improvement. Small bilateral effusions and bibasilar atelectasis remain. IMPRESSION: Improvement in pulmonary edema. Improvement in bibasilar atelectasis. Small bilateral effusions. Electronically Signed   By: Franchot Gallo M.D.   On: 10/09/2017 07:55     Medications:   . sodium chloride Stopped (10/08/17 1703)  . ceFEPime (MAXIPIME) IV Stopped (10/09/17 1847)   . aspirin EC  81 mg Oral Daily  . atorvastatin  80 mg Oral QHS  . clopidogrel  75 mg Oral Q breakfast  . diphenhydrAMINE  12.5 mg  Intravenous Once  . donepezil  5 mg Oral QHS  . famotidine  20 mg Oral Daily  . feeding supplement (NEPRO CARB STEADY)  237 mL Oral TID BM  . furosemide  80 mg Intravenous Q12H  . heparin injection (subcutaneous)  5,000 Units Subcutaneous Q8H  . insulin aspart  0-15 Units Subcutaneous TID WC  . insulin aspart  0-5 Units Subcutaneous QHS  . isosorbide mononitrate  30 mg Oral Daily  . linagliptin  5 mg Oral Daily  . metoprolol tartrate  25 mg Oral Q6H  . multivitamin  1 tablet Oral QHS  . multivitamin with minerals  1 tablet Oral Daily  . sodium chloride flush  3 mL Intravenous Q12H  . timolol  1 drop Both Eyes Daily   sodium chloride, acetaminophen **OR** acetaminophen, acetaminophen, bisacodyl, gi cocktail, labetalol, loperamide, Melatonin, ondansetron **OR** ondansetron (ZOFRAN) IV, senna-docusate, sodium chloride flush  Assessment/ Plan:  Ray Lamb is a 84 y.o. white male with dementia, hypertension, diabetes, coronary artery disease status post CABG, CVA, pulmonary hypertension, severe mitral regurgitation.   1. Acute renal failure on chronic kidney disease stage IV with hyperkalemia and proteinuria: baseline creatinine of 2.79, GFR of 20 on 09/13/17 Chronic kidney disease secondary to diabetes, hypertension and vascular disease Acute renal failure secondary to prerenal azotemia with diarrhea and GI losses.  No acute indication for dialysis today. Last hemodialysis was on Saturday, 8/24.  Nonoliguric urine output.  - IV furosemide 80mg  - transition to PO torsemide later today.  - Keep dialysis permcath in for now  2. Hypertension: blood pressure at goal. Off vasopressors.  - imdur - IV diuretics.   3. Anemia of chronic kidney disease: hemoglobin 8.8 - no indication for ESA.    LOS: 10 Ray Lamb 8/28/201910:44 AM

## 2017-10-10 NOTE — Evaluation (Signed)
Clinical/Bedside Swallow Evaluation Patient Details  Name: Ray Lamb MRN: [id number] Date of Birth: February 10, 1931  Today's Date: 10/10/2017 Time: SLP Start Time (ACUTE ONLY): 1000 SLP Stop Time (ACUTE ONLY): 1100 SLP Time Calculation (min) (ACUTE ONLY): 60 min  Past Medical History:  Past Medical History:  Diagnosis Date  . Anemia   . Chronic kidney disease    STAGE 4  . Coronary atherosclerosis    CABG X 4  . Diabetes mellitus without complication ([place])   . Edema    ankle  . GERD (gastroesophageal reflux disease)   . Glaucoma   . Hearing loss   . Heart murmur   . History of hiatal hernia   . Hyperlipidemia   . Hypertension   . Mini stroke ([place])   . Mitral insufficiency   . Myocardial infarction ([place])    mild, heart cath done no stents needed  . Stroke [facility]) 2011   TIA   Past Surgical History:  Past Surgical History:  Procedure Laterality Date  . CATARACT EXTRACTION W/PHACO Left 09/05/2016   Procedure: CATARACT EXTRACTION PHACO AND INTRAOCULAR LENS PLACEMENT (IOC);  Surgeon: Birder Robson, MD;  Location: [facility] ORS;  Service: Ophthalmology;  Laterality: Left;  [place] 00:43.1 AP% 22.6 CDE 9.77 Fluid Pack lot #[id number]  . CATARACT EXTRACTION W/PHACO Right 09/26/2016   Procedure: CATARACT EXTRACTION PHACO AND INTRAOCULAR LENS PLACEMENT ([place]);  Surgeon: Birder Robson, MD;  Location: [facility] ORS;  Service: Ophthalmology;  Laterality: Right;  [place] 01:00 AP% 16.0 CDE 9.71 Fluid pack lot # [id number]  . CORONARY ARTERY BYPASS GRAFT  2003  . DIALYSIS/PERMA CATHETER INSERTION N/A 10/04/2017   Procedure: DIALYSIS/PERMA CATHETER INSERTION;  Surgeon: Algernon Huxley, MD;  Location: [place] [facility];  Service: Cardiovascular;  Laterality: N/A;  . ESOPHAGOGASTRODUODENOSCOPY (EGD) WITH PROPOFOL N/A 04/25/2016   Procedure: ESOPHAGOGASTRODUODENOSCOPY (EGD) WITH PROPOFOL;  Surgeon: Lucilla Lame, MD;  Location: [facility] ENDOSCOPY;  Service: Endoscopy;  Laterality: N/A;  . JOINT REPLACEMENT Right 2010    knee  . LEFT HEART CATH AND CORONARY ANGIOGRAPHY N/A 10/04/2017   Procedure: LEFT HEART CATH AND CORONARY ANGIOGRAPHY;  Surgeon: Isaias Cowman, MD;  Location: [place] [facility];  Service: Cardiovascular;  Laterality: N/A;  . SKIN CANCER EXCISION     HPI:  Pt is a 84 y.o. male with a known history of T2NIDDM, CKD IV p/w N/V/D/AP/CP. Pt is on BiPAP, and is unable to provide Hx/ROS. Hx obtained from pt's wife at bedside. She is a poor historian. She states that pt was recently started on lactulose for hyperkalemia. He took two doses, and had diarrhea x3d (Mon 08/12-Wed 08/14). He then developed N/V x3d Ray Lamb 08/15-Sat 08/17). N/V is postprandial. Pt has sharp midchest pain after PO intake. It radiates to the abdomen sometimes. The abdominal pain is described as sharp and diffuse. This has been going on x2-68mo, but became acutely worse today. He has chronic indigestion and heartburn. He denies exertional CP, and can ambulate x79min w/o CP. He denied F/C, diaphoresis, night sweats, rigors, cough, SOB, LH/LOC, urinary symptoms prior to coming to the hospital. He received IVF in ED, and developed SOB/hypoxemic respiratory failure necessitating BiPAP.  Currently on [place] O2 support tolerating well.    Assessment / Plan / Recommendation Clinical Impression  Pt appears to present w/ adequate oropharyngeal phase swallow function w/ reduced risk for aspiration from an oropharyngeal phase standpoint. However, pt DOES have significant Esophageal phase dysmotility and a LARGE Hiatal Hernia per MD/radiology report and has been referred to  f/u w/ GI for assessment/management. Pt did endorse s/s of REFLUX and Esophageal dysmotility at this evaluation and at home prior to admission. He described "years" of dysmotility and discomfort w/ food "getting stuck" and causing "pain in my chest" pointing to mid-sternum area. He also endorsed episodes of regurgitation and N/V, especially in past week. During this evaluation,  no significant Esophageal deficits or discomfort was noted by pt/SLP during oral intake. Pt consumed trials of thin liquids via Cup then purees and soft solids w/ instruction/education on general aspiration precautions. No overt s/s of aspiration noted; no decline in respiratory effort/presentation was noted during po trials; clear vocal quality post trials. Oral phase was grossly [facility] for bolus management and oral clearing; timely A-P transfer. Pt fed self. Education given on need to monitor for s/s Reflux AND take TIME b/t every ~3 boluses of foods/liquids to allow for Esophageal clearing. Educated pt on food choices and diet preparation to include reducing meats, breads in diet. Encouraged him to follow the Reflux and aspiration precautions during meals; and small, mini meals instead of larger meals. Recommend a more mech soft type diet w/ aspiration precautions; Pills in puree; REFLUX precautions and f/u w/ GI. Family requested more education on the reflux and diet recommendations.  SLP Visit Diagnosis: Dysphagia, unspecified (R13.10)(Esophageal phase dysphagia/dysmotility)    Aspiration Risk  (reduced from an oropharyngeal phase standpoint)    Diet Recommendation  more of Mech Soft diet w/ thin liquids; general aspiration precautions. Strict REFLUX precautions.   Medication Administration: Whole meds with puree(as needed for safer swallowing/ Esophageal clearing)    Other  Recommendations Recommended Consults: Consider GI evaluation;Consider esophageal assessment(f/u w/ GI next week per pt) Oral Care Recommendations: Oral care BID;Patient independent with oral care Other Recommendations: (n/a)   Follow up Recommendations None      Frequency and Duration min 1 x/week  1 week       Prognosis Prognosis for Safe Diet Advancement: Fair(-Good) Barriers to Reach Goals: Severity of deficits(GI dysmotility - Hiatal Hernia)      Swallow Study   General Date of Onset: 09/29/17 HPI: Pt is a 84  y.o. male with a known history of T2NIDDM, CKD IV p/w N/V/D/AP/CP. Pt is on BiPAP, and is unable to provide Hx/ROS. Hx obtained from pt's wife at bedside. She is a poor historian. She states that pt was recently started on lactulose for hyperkalemia. He took two doses, and had diarrhea x3d (Mon 08/12-Wed 08/14). He then developed N/V x3d Ray Lamb 08/15-Sat 08/17). N/V is postprandial. Pt has sharp midchest pain after PO intake. It radiates to the abdomen sometimes. The abdominal pain is described as sharp and diffuse. This has been going on x2-37mo, but became acutely worse today. He has chronic indigestion and heartburn. He denies exertional CP, and can ambulate x16min w/o CP. He denied F/C, diaphoresis, night sweats, rigors, cough, SOB, LH/LOC, urinary symptoms prior to coming to the hospital. He received IVF in ED, and developed SOB/hypoxemic respiratory failure necessitating BiPAP.  Currently on [place] O2 support tolerating well.  Type of Study: Bedside Swallow Evaluation Previous Swallow Assessment: none reported Diet Prior to this Study: NPO(had been on a regular diet during admission and at home) Temperature Spikes Noted: No(wbc 13.3 down from 16.8) Respiratory Status: Nasal cannula(2 liters) Behavior/Cognition: Alert;Cooperative;Pleasant mood(min HOH) Oral Cavity Assessment: Within Functional Limits Oral Care Completed by SLP: Recent completion by staff Oral Cavity - Dentition: Adequate natural dentition Vision: Functional for self-feeding Self-Feeding Abilities: Able to feed self Patient  Positioning: Upright in bed Baseline Vocal Quality: Normal Volitional Cough: Strong Volitional Swallow: Able to elicit    Oral/Motor/Sensory Function Overall Oral Motor/Sensory Function: Within functional limits   Ice Chips Ice chips: Within functional limits Presentation: Spoon(fed; 2 trials)   Thin Liquid Thin Liquid: Within functional limits Presentation: Cup;Self Fed(~4 ozs )    Nectar Thick Nectar Thick  Liquid: Not tested   Honey Thick Honey Thick Liquid: Not tested   Puree Puree: Within functional limits Presentation: Self Fed;Spoon(4 ozs)   Solid     Solid: Within functional limits(soft solids) Presentation: Self Fed;Spoon(6 trials)       [name], MS, CCC-SLP Jhoan Schmieder 10/10/2017,4:15 PM

## 2017-10-10 NOTE — Progress Notes (Signed)
SUBJECTIVE: Feeling much better today. Shortness of breath has improved. Pt is alert and responds appropriately.    Vitals:   10/10/17 0451 10/10/17 0500 10/10/17 0600 10/10/17 0800  BP:  125/66    Pulse:  79 74 78  Resp:  (!) 25 (!) 24 (!) 21  Temp:    97.9 F (36.6 C)  TempSrc:    Oral  SpO2:  100% 100% 100%  Weight: 73.2 kg     Height:        Intake/Output Summary (Last 24 hours) at 10/10/2017 0905 Last data filed at 10/10/2017 0800 Gross per 24 hour  Intake 270 ml  Output 3160 ml  Net -2890 ml    LABS: Basic Metabolic Panel: Recent Labs    10/09/17 0547 10/10/17 0608  NA 134* 135  K 4.7 4.2  CL 97* 90*  CO2 30 31  GLUCOSE 221* 144*  BUN 63* 69*  CREATININE 3.45* 3.33*  CALCIUM 9.5 9.7  PHOS  --  3.5   Liver Function Tests: Recent Labs    10/10/17 0608  ALBUMIN 2.6*   No results for input(s): LIPASE, AMYLASE in the last 72 hours. CBC: Recent Labs    10/09/17 0547 10/10/17 0608  WBC 14.8* 13.3*  NEUTROABS 10.4* 8.7*  HGB 8.5* 8.8*  HCT 24.9* 26.7*  MCV 91.1 91.5  PLT 295 365   Cardiac Enzymes: Recent Labs    10/07/17 2307  TROPONINI 9.58*   BNP: Invalid input(s): POCBNP D-Dimer: No results for input(s): DDIMER in the last 72 hours. Hemoglobin A1C: No results for input(s): HGBA1C in the last 72 hours. Fasting Lipid Panel: No results for input(s): CHOL, HDL, LDLCALC, TRIG, CHOLHDL, LDLDIRECT in the last 72 hours. Thyroid Function Tests: No results for input(s): TSH, T4TOTAL, T3FREE, THYROIDAB in the last 72 hours.  Invalid input(s): FREET3 Anemia Panel: No results for input(s): VITAMINB12, FOLATE, FERRITIN, TIBC, IRON, RETICCTPCT in the last 72 hours.   PHYSICAL EXAM General: Pale, alert and comfortable, no acute distress HEENT:  Normocephalic and atramatic Neck:  No JVD.  Lungs: Clear bilaterally to auscultation and percussion. Heart: HRRR . Normal S1 and S2 without gallops or murmurs.  Abdomen: Bowel sounds are positive, abdomen  soft and non-tender  Extremities: No clubbing, cyanosis or edema.   Neuro: Alert and oriented X 3. Psych:  Good affect, responds appropriately  TELEMETRY: NSR 75bpm  ASSESSMENT AND PLAN: CHF/NSTEMI/Respiratory failure/Severe LV systolic dysfunction due to MI and sinus tachycardia/Acute on chronic Renal failure.   Shortness of breath is much better today, pt responded well to IV lasix and nephrology will transition to oral torsemide. Pt requesting discharge to [facility] for rehab before going home. Continue Plavix, aspirin, Lipitor and metoprolol.    Active Problems:   Acute hypoxemic respiratory failure ([place])    Jake Bathe, NP-C 10/10/2017 9:05 AM Cell: [phone]

## 2017-10-11 DIAGNOSIS — Z515 Encounter for palliative care: Secondary | ICD-10-CM

## 2017-10-11 DIAGNOSIS — I214 Non-ST elevation (NSTEMI) myocardial infarction: Secondary | ICD-10-CM

## 2017-10-11 DIAGNOSIS — Z7189 Other specified counseling: Secondary | ICD-10-CM

## 2017-10-11 DIAGNOSIS — I252 Old myocardial infarction: Secondary | ICD-10-CM

## 2017-10-11 LAB — GLUCOSE, CAPILLARY
GLUCOSE-CAPILLARY: 157 mg/dL — AB (ref 70–99)
GLUCOSE-CAPILLARY: 175 mg/dL — AB (ref 70–99)
GLUCOSE-CAPILLARY: 188 mg/dL — AB (ref 70–99)
GLUCOSE-CAPILLARY: 252 mg/dL — AB (ref 70–99)
Glucose-Capillary: 295 mg/dL — ABNORMAL HIGH (ref 70–99)

## 2017-10-11 LAB — RENAL FUNCTION PANEL
ALBUMIN: 2.5 g/dL — AB (ref 3.5–5.0)
Anion gap: 15 (ref 5–15)
BUN: 79 mg/dL — AB (ref 8–23)
CO2: 36 mmol/L — ABNORMAL HIGH (ref 22–32)
CREATININE: 3.72 mg/dL — AB (ref 0.61–1.24)
Calcium: 9.8 mg/dL (ref 8.9–10.3)
Chloride: 84 mmol/L — ABNORMAL LOW (ref 98–111)
GFR, EST AFRICAN AMERICAN: 16 mL/min — AB (ref >60)
GFR, EST NON AFRICAN AMERICAN: 14 mL/min — AB (ref >60)
Glucose, Bld: 212 mg/dL — ABNORMAL HIGH (ref 70–99)
POTASSIUM: 4.1 mmol/L (ref 3.5–5.1)
Phosphorus: 3.6 mg/dL (ref 2.5–4.6)
Sodium: 135 mmol/L (ref 135–145)

## 2017-10-11 MED ORDER — TORSEMIDE 20 MG PO TABS
20.0000 mg | ORAL_TABLET | Freq: Two times a day (BID) | ORAL | Status: DC
  Administered 2017-10-11 – 2017-10-12 (×3): 20 mg via ORAL
  Filled 2017-10-11 (×3): qty 1

## 2017-10-11 MED ORDER — METOPROLOL TARTRATE 50 MG PO TABS
50.0000 mg | ORAL_TABLET | Freq: Two times a day (BID) | ORAL | Status: DC
  Administered 2017-10-12 – 2017-10-14 (×5): 50 mg via ORAL
  Filled 2017-10-11 (×5): qty 1

## 2017-10-11 NOTE — Care Management Note (Signed)
Case Management Note  Patient Details  Name: Ray Lamb MRN: [id number] Date of Birth: July 23, 1930  Subjective/Objective:       Patient states he lives at home with wife and is independent. No current home services. Patient was on RA in the home.   Plan for discharge to SNF.  Touched base with [facility], will keep permacath in place but does not anticipate need for HD after discharge.  No further needs at this time.             Action/Plan:   Expected Discharge Date:                  Expected Discharge Plan:     In-House Referral:     Discharge planning Services     Post Acute Care Choice:    Choice offered to:     DME Arranged:    DME Agency:     HH Arranged:    HH Agency:     Status of Service:     If discussed at [employer] of Stay Meetings, dates discussed:    Additional Comments:  Elza Rafter, RN 10/11/2017, 3:15 PM

## 2017-10-11 NOTE — Progress Notes (Signed)
SUBJECTIVE: patient is feeling much better denies any chest pain or shortness of breath   Vitals:   10/10/17 1634 10/10/17 1934 10/11/17 0452 10/11/17 0742  BP: 128/61 (!) 122/52 131/61 (!) 141/58  Pulse: 91 82 74 67  Resp: 18 16 17 14   Temp: 97.9 F (36.6 C) 98.7 F (37.1 C) 97.7 F (36.5 C) 97.8 F (36.6 C)  TempSrc: Oral Oral Oral Oral  SpO2: 99% 99% 99% 99%  Weight:   70.5 kg   Height:        Intake/Output Summary (Last 24 hours) at 10/11/2017 0850 Last data filed at 10/11/2017 0730 Gross per 24 hour  Intake 3 ml  Output 2470 ml  Net -2467 ml    LABS: Basic Metabolic Panel: Recent Labs    10/09/17 0547 10/10/17 0608  NA 134* 135  K 4.7 4.2  CL 97* 90*  CO2 30 31  GLUCOSE 221* 144*  BUN 63* 69*  CREATININE 3.45* 3.33*  CALCIUM 9.5 9.7  PHOS  --  3.5   Liver Function Tests: Recent Labs    10/10/17 0608  ALBUMIN 2.6*   No results for input(s): LIPASE, AMYLASE in the last 72 hours. CBC: Recent Labs    10/09/17 0547 10/10/17 0608  WBC 14.8* 13.3*  NEUTROABS 10.4* 8.7*  HGB 8.5* 8.8*  HCT 24.9* 26.7*  MCV 91.1 91.5  PLT 295 365   Cardiac Enzymes: No results for input(s): CKTOTAL, CKMB, CKMBINDEX, TROPONINI in the last 72 hours. BNP: Invalid input(s): POCBNP D-Dimer: No results for input(s): DDIMER in the last 72 hours. Hemoglobin A1C: No results for input(s): HGBA1C in the last 72 hours. Fasting Lipid Panel: No results for input(s): CHOL, HDL, LDLCALC, TRIG, CHOLHDL, LDLDIRECT in the last 72 hours. Thyroid Function Tests: No results for input(s): TSH, T4TOTAL, T3FREE, THYROIDAB in the last 72 hours.  Invalid input(s): FREET3 Anemia Panel: No results for input(s): VITAMINB12, FOLATE, FERRITIN, TIBC, IRON, RETICCTPCT in the last 72 hours.   PHYSICAL EXAM General: Well developed, well nourished, in no acute distress HEENT:  Normocephalic and atramatic Neck:  No JVD.  Lungs: Clear bilaterally to auscultation and percussion. Heart: HRRR .  Normal S1 and S2 without gallops or murmurs.  Abdomen: Bowel sounds are positive, abdomen soft and non-tender  Msk:  Back normal, normal gait. Normal strength and tone for age. Extremities: No clubbing, cyanosis or edema.   Neuro: Alert and oriented X 3. Psych:  Good affect, responds appropriately  TELEMETRY: sinus rhythm 69 bpm  ASSESSMENT AND PLAN: status post sinus tachycardia much which is much better with metoprolol 25 every 6 hours. Can be changed to 50 by mouth twice Lamb day. Patient can get his physical therapy and can be discharged with follow-up in the office next week.  Active Problems:   Acute hypoxemic respiratory failure ([facility])    Ray Lamb,Ray A, MD, [facility] 10/11/2017 8:50 AM

## 2017-10-11 NOTE — Progress Notes (Signed)
PT Cancellation Note  Patient Details Name: Ray Lamb MRN: [id number] DOB: 06-23-1930   Cancelled Treatment:    Reason Eval/Treat Not Completed: Patient at procedure or test/unavailable.  Patient with physician and speech therapy upon PT arrival.  Will re-attempt later as time allows.   Roxanne Gates, PT, DPT 10/11/2017, 9:47 AM

## 2017-10-11 NOTE — Consult Note (Signed)
Consultation Note Date: 10/11/2017   Patient Name: Ray Lamb  DOB: 1930-10-16  MRN: [id number]  Age / Sex: 84 y.o., male  PCP: Ray Maple, MD Referring Physician: Demetrios Loll, MD  Reason for Consultation: Establishing goals of care  HPI/Patient Profile: 84 y.o. male  with past medical history of CKD, DM, GERD, HLD, HTN, MI, CVA, CABGx4, dementia, and pulmonary htn admitted on 09/29/2017 with abdominal pain. Found to have NSTEMI. Troponin >65. S/p cardiac cath. Patient on and off bipap throughout admission. EF 35-40%. PMT consulted for [place].   Clinical Assessment and Goals of Care: I have reviewed medical records including EPIC notes, labs and imaging, received report from RN, assessed the patient and then met at the bedside along with patient and his son, Ray Lamb,  to discuss diagnosis prognosis, Ray Lamb, EOL wishes, disposition and options.  I introduced Palliative Medicine as specialized medical care for people living with serious illness. It focuses on providing relief from the symptoms and stress of a serious illness. The goal is to improve quality of life for both the patient and the family.  Patient and son both report that patient has significantly improved. No further shortness of breath and now tolerating nasal cannula only - not requiring bipap.   Son tells me that patient's goals are to have the best quality of life for as long as possible but would not want aggressive interventions. Would not want intubation. Patient tells me "when it's my time, it's my time".   Patient tells me about plans for rehab following hospitalization and agrees to outpatient palliative follow-up.   He requests I call his wife to continue palliative care conversation. She did not answer, but left voicemail for call back.    Questions and concerns were addressed.  The family was encouraged to call with questions or concerns.   Primary Decision Maker NEXT OF  KIN - wife, Ray Lamb    SUMMARY OF RECOMMENDATIONS   - continued conversations planned with patient's wife - agreeable to outpatient palliative follow-up - no intubation  Code Status/Advance Care Planning:  Limited code - do not intubate   Symptom Management:   Currently comfortable, denies symptoms  Palliative Prophylaxis:   Aspiration, Bowel Regimen, Frequent Pain Assessment and Oral Care  Additional Recommendations (Limitations, Scope, Preferences):  Full Scope Treatment and no intubation  Prognosis:   Unable to determine  Discharge Planning: [place] for rehab with Palliative care service follow-up      Primary Diagnoses: Present on Admission: . Acute hypoxemic respiratory failure ([place])   I have reviewed the medical record, interviewed the patient and family, and examined the patient. The following aspects are pertinent.  Past Medical History:  Diagnosis Date  . Anemia   . Chronic kidney disease    STAGE 4  . Coronary atherosclerosis    CABG X 4  . Diabetes mellitus without complication ([place])   . Edema    ankle  . GERD (gastroesophageal reflux disease)   . Glaucoma   . Hearing loss   . Heart murmur   . History of hiatal hernia   . Hyperlipidemia   . Hypertension   . Mini stroke ([place])   . Mitral insufficiency   . Myocardial infarction ([place])    mild, heart cath done no stents needed  . Stroke [facility]) 2011   TIA   Social History   Socioeconomic History  . Marital status: Married    Spouse name: Not on file  . Number of children:  Not on file  . Years of education: Not on file  . Highest education level: Not on file  Occupational History  . Not on file  Social Needs  . Financial resource strain: Not on file  . Food insecurity:    Worry: Not on file    Inability: Not on file  . Transportation needs:    Medical: Not on file    Non-medical: Not on file  Tobacco Use  . Smoking status: Former Smoker    Last attempt to quit:  09/02/1964    Years since quitting: 53.1  . Smokeless tobacco: Never Used  Substance and Sexual Activity  . Alcohol use: No    Alcohol/week: 0.0 standard drinks  . Drug use: No  . Sexual activity: Not on file  Lifestyle  . Physical activity:    Days per week: Not on file    Minutes per session: Not on file  . Stress: Not on file  Relationships  . Social connections:    Talks on phone: Not on file    Gets together: Not on file    Attends religious service: Not on file    Active member of club or organization: Not on file    Attends meetings of clubs or organizations: Not on file    Relationship status: Not on file  Other Topics Concern  . Not on file  Social History Narrative  . Not on file   Family History  Problem Relation Age of Onset  . Stroke Mother   . Diabetes Mother   . Diabetes Sister   . Diabetes Maternal Grandmother    Scheduled Meds: . aspirin EC  81 mg Oral Daily  . atorvastatin  80 mg Oral QHS  . clopidogrel  75 mg Oral Q breakfast  . diphenhydrAMINE  12.5 mg Intravenous Once  . donepezil  5 mg Oral QHS  . famotidine  20 mg Oral Daily  . feeding supplement (NEPRO CARB STEADY)  237 mL Oral TID BM  . furosemide  80 mg Intravenous Q12H  . heparin injection (subcutaneous)  5,000 Units Subcutaneous Q8H  . insulin aspart  0-15 Units Subcutaneous TID WC  . insulin aspart  0-5 Units Subcutaneous QHS  . isosorbide mononitrate  30 mg Oral Daily  . linagliptin  5 mg Oral Daily  . metoprolol tartrate  25 mg Oral Q6H  . multivitamin  1 tablet Oral QHS  . multivitamin with minerals  1 tablet Oral Daily  . sodium chloride flush  3 mL Intravenous Q12H  . timolol  1 drop Both Eyes Daily   Continuous Infusions: . sodium chloride Stopped (10/08/17 1703)  . ceFEPime (MAXIPIME) IV Stopped (10/10/17 1948)   PRN Meds:.sodium chloride, acetaminophen **OR** acetaminophen, acetaminophen, bisacodyl, gi cocktail, labetalol, loperamide, Melatonin, ondansetron **OR**  ondansetron (ZOFRAN) IV, senna-docusate, sodium chloride flush Allergies  Allergen Reactions  . Bee Venom Hives  . Iodinated Diagnostic Agents Rash  . Metrizamide Rash   Review of Systems  Constitutional: Positive for activity change and fatigue.  Neurological: Positive for weakness.  All other systems reviewed and are negative.   Physical Exam  Constitutional: He is oriented to person, place, and time. He appears well-developed and well-nourished. No distress.  HENT:  Head: Normocephalic and atraumatic.  Cardiovascular: Normal rate and regular rhythm.  Pulmonary/Chest: Effort normal and breath sounds normal. No respiratory distress.  Abdominal: Soft. Bowel sounds are normal.  Neurological: He is alert and oriented to person, place, and time.  Periods of confusion  Skin: Skin is warm and dry. He is not diaphoretic.  Psychiatric: He has a normal mood and affect. His behavior is normal.    Vital Signs: BP (!) 141/58 (BP Location: Left Arm)   Pulse 67   Temp 97.8 F (36.6 C) (Oral)   Resp 14   Ht 5' 7" (1.702 m)   Wt 70.5 kg   SpO2 99%   BMI 24.35 kg/m  Pain Scale: 0-10 POSS *See Group Information*: 1-Acceptable,Awake and alert Pain Score: 0-No pain   SpO2: SpO2: 99 % O2 Device:SpO2: 99 % O2 Flow Rate: .O2 Flow Rate (L/min): 2 L/min  IO: Intake/output summary:   Intake/Output Summary (Last 24 hours) at 10/11/2017 0936 Last data filed at 10/11/2017 7124 Gross per 24 hour  Intake 6 ml  Output 2470 ml  Net -2464 ml    LBM: Last BM Date: 10/09/17 Baseline Weight: Weight: 72.6 kg Most recent weight: Weight: 70.5 kg     Palliative Assessment/Data: PPS 50%     Time Total: 30 minutes Greater than 50%  of this time was spent counseling and coordinating care related to the above assessment and plan.  Juel Burrow, DNP, AGNP-C Palliative Medicine Team [phone] Pager: [phone]

## 2017-10-11 NOTE — Evaluation (Signed)
Physical Therapy Evaluation Patient Details Name: Ray Lamb MRN: [id number] DOB: 1931/01/06 Today's Date: 10/11/2017   History of Present Illness  Patient is an 84 year old male admitted for acute hypoxemic respiratory failure following c/o SOB yesterday.  was found to have a non-ST elevation MI.  Patient is status post cardiac catheterization with drug-eluting stent to the previous bypass of SVG to OM 2.  Experienced acute hypoxemic respiratory failure 10/09/2017.  PMH includes stroke, MI, mitral insufficiency, glaucoma, CKD and anemia.  Clinical Impression  Patient is an 84 year old male who lives in a one story home with his wife.  He is independent with use of SPC at baseline. Today pt is alert and oriented and reports increased fatigue but no pain.  He was able to perform bed mobility mod I with increased time and sit at EOB with fair-good balance.  Pt presented with good muscle strength of LE's and decreased strength of UE's.  He was able to perform STS with min A for support and management of RW.  Pt appeared unsteady on feet and stated that he thought he would be able to perform a standing pivot transfer.  He was able to complete with min A and several rest breaks, presenting with low foot clearance and decreased step length which indicate fall risk.  PT reviewed HEP and emphasized importance of frequency of there ex to prevent deconditioning and pt expressed understanding.  Pt will benefit from skilled PT with focus on tolerance to activity, safe functional mobility and use of AD, fall prevention and HEP for balance and strengthening.  Discharge to SNF is recommended at this time to pt's decrease in tolerance to activity, safe mobility and balance compared to baseline.    Follow Up Recommendations SNF    Equipment Recommendations  None recommended by PT(TBD at next venue of care.)    Recommendations for Other Services       Precautions / Restrictions Precautions Precautions:  Fall Restrictions Weight Bearing Restrictions: No      Mobility  Bed Mobility Overal bed mobility: Modified Independent             General bed mobility comments: Increased time and use of bed rail.  Transfers Overall transfer level: Needs assistance Equipment used: Rolling walker (2 wheeled) Transfers: Sit to/from [facility] Sit to Stand: Min assist Stand pivot transfers: Min assist       General transfer comment: Pt unsteady on feet. Able to rise with Min A and use of RW.  Reported no dizziness.  Able to transfer with VC's for management of RW and redirection for sequencing.  Low shuffling steps with frequent rest breaks.  Pt reported that he was surprised that "I feel as good as I do".  Vitals remained WNL.  Ambulation/Gait                Stairs            Wheelchair Mobility    Modified Rankin (Stroke Patients Only)       Balance Overall balance assessment: Needs assistance Sitting-balance support: Single extremity supported Sitting balance-Leahy Scale: Good     Standing balance support: Bilateral upper extremity supported Standing balance-Leahy Scale: Fair                               Pertinent Vitals/Pain Pain Assessment: No/denies pain    Home Living Family/patient expects to be discharged to::  Skilled nursing facility Living Arrangements: Spouse/significant other Available Help at Discharge: Family;Available 24 hours/day Type of Home: House Home Access: Stairs to enter   [employer] of Steps: 4 Home Layout: One level Home Equipment: Cane - single point      Prior Function Level of Independence: Independent with assistive device(s)         Comments: Used SPC to ambulate prior to admission.     Hand Dominance        Extremity/Trunk Assessment   Upper Extremity Assessment Upper Extremity Assessment: Generalized weakness    Lower Extremity Assessment Lower Extremity  Assessment: Overall WFL for tasks assessed(Grossly 4/5 bilaterally.)    Cervical / Trunk Assessment Cervical / Trunk Assessment: Kyphotic  Communication   Communication: No difficulties  Cognition Arousal/Alertness: Awake/alert Behavior During Therapy: WFL for tasks assessed/performed Overall Cognitive Status: Within Functional Limits for tasks assessed                                 General Comments: Follows commands consistently.  More oriented today.      General Comments      Exercises Other Exercises Other Exercises: Reviewed HEP and re-issued supine HEP emphasizing imortance of frequency.  x5 min   Assessment/Plan    PT Assessment Patient needs continued PT services  PT Problem List Decreased strength;Decreased mobility;Decreased activity tolerance;Decreased knowledge of use of DME;Decreased balance       PT Treatment Interventions DME instruction;Therapeutic activities;Gait training;Therapeutic exercise;Patient/family education;Stair training;Balance training;Functional mobility training;Neuromuscular re-education    PT Goals (Current goals can be found in the Care Plan section)  Acute Rehab PT Goals Patient Stated Goal: To return to generally active lifestyle. PT Goal Formulation: With patient Time For Goal Achievement: 11/01/17 Potential to Achieve Goals: Good    Frequency Min 2X/week   Barriers to discharge        Co-evaluation               AM-PAC PT "6 Clicks" Daily Activity  Outcome Measure Difficulty turning over in bed (including adjusting bedclothes, sheets and blankets)?: None Difficulty moving from lying on back to sitting on the side of the bed? : A Little Difficulty sitting down on and standing up from a chair with arms (e.g., wheelchair, bedside commode, etc,.)?: A Little Help needed moving to and from a bed to chair (including a wheelchair)?: A Little Help needed walking in hospital room?: A Little Help needed climbing  3-5 steps with a railing? : A Little 6 Click Score: 19    End of Session Equipment Utilized During Treatment: Gait belt;Oxygen Activity Tolerance: Patient limited by fatigue Patient left: in chair;with call bell/phone within reach;with family/visitor present;with nursing/sitter in room   PT Visit Diagnosis: Unsteadiness on feet (R26.81);Muscle weakness (generalized) (M62.81)    Time: 4481-8563 PT Time Calculation (min) (ACUTE ONLY): 24 min   Charges:   PT Evaluation $PT Eval Low Complexity: 1 Low PT Treatments $Therapeutic Activity: 8-22 mins       Roxanne Gates, PT, DPT   Roxanne Gates 10/11/2017, 10:51 AM

## 2017-10-11 NOTE — Progress Notes (Signed)
[place] [facility] [place] NAME: Ray Lamb    MR#:  [id number]  DATE OF BIRTH:  09-Jan-1931  SUBJECTIVE:   Patient presented to the hospital due to abdominal pain but was found to have a non-ST elevation MI.  Patient is status post cardiac catheterization with drug-eluting stent to the previous bypass of SVG to OM 2.    Patient is on oxygen by nasal cannula 2 L, off BiPAP.  He has no complaints except for generalized weakness.  REVIEW OF SYSTEMS:    Review of Systems  Constitutional: Negative for chills and fever.  HENT: Negative for congestion and tinnitus.   Eyes: Negative for blurred vision and double vision.  Respiratory: Negative for cough, shortness of breath (Improved. ) and wheezing.   Cardiovascular: Negative for chest pain, orthopnea and PND.  Gastrointestinal: Negative for abdominal pain, diarrhea, nausea and vomiting.  Genitourinary: Negative for dysuria and hematuria.  Musculoskeletal: Negative for back pain and joint pain.  Skin: Negative for rash.  Neurological: Negative for dizziness, sensory change and focal weakness.  Psychiatric/Behavioral: Negative for depression. The patient is not nervous/anxious.   All other systems reviewed and are negative.   Nutrition: Renal/Carb control Tolerating Diet: Yes Tolerating PT: Await Eval.    DRUG ALLERGIES:   Allergies  Allergen Reactions  . Bee Venom Hives  . Iodinated Diagnostic Agents Rash  . Metrizamide Rash    VITALS:  Blood pressure (!) 141/58, pulse 67, temperature 97.8 F (36.6 C), temperature source Oral, resp. rate 14, height 5\' 7"  (1.702 m), weight 70.5 kg, SpO2 99 %.  PHYSICAL EXAMINATION:   Physical Exam  GENERAL:  84 y.o.-year-old patient lying in bed in no acute distress.  EYES: Pupils equal, round, reactive to light and accommodation. No scleral icterus. Extraocular muscles intact.  HEENT: Head atraumatic, normocephalic. Oropharynx and nasopharynx clear.   NECK:  Supple, no jugular venous distention. No thyroid enlargement, no tenderness.  LUNGS: Normal breath sounds bilaterally, no wheezing, bibasilar rales, rhonchi. No use of accessory muscles of respiration.  CARDIOVASCULAR: S1, S2 normal. No murmurs, rubs, or gallops.  ABDOMEN: Soft, nontender, nondistended. Bowel sounds present. No organomegaly or mass.  EXTREMITIES: No cyanosis, clubbing or edema b/l.    NEUROLOGIC: Cranial nerves II through XII are intact. No focal Motor or sensory deficits b/l. Globally weak.    PSYCHIATRIC: The patient is alert and oriented x 3.  SKIN: No obvious rash, lesion, or ulcer.    LABORATORY PANEL:   CBC Recent Labs  Lab 10/10/17 0608  WBC 13.3*  HGB 8.8*  HCT 26.7*  PLT 365   ------------------------------------------------------------------------------------------------------------------  Chemistries  Recent Labs  Lab 10/11/17 1041  NA 135  K 4.1  CL 84*  CO2 36*  GLUCOSE 212*  BUN 79*  CREATININE 3.72*  CALCIUM 9.8   ------------------------------------------------------------------------------------------------------------------  Cardiac Enzymes Recent Labs  Lab 10/07/17 2307  TROPONINI 9.58*   ------------------------------------------------------------------------------------------------------------------  RADIOLOGY:  Dg Chest Port 1 View  Result Date: 10/10/2017 CLINICAL DATA:  Congestive failure EXAM: PORTABLE CHEST 1 VIEW COMPARISON:  10/09/2017 FINDINGS: Cardiac shadow is stable. Postsurgical changes are noted. Dialysis catheter is again seen and stable. Mild vascular congestion is noted although somewhat improved when compared with the prior exam. Persistent effusions are noted left greater than right. Hiatal hernia is again seen. IMPRESSION: Slight improvement in vascular congestion. Small effusions are again noted. Electronically Signed   By: Inez Catalina M.D.   On: 10/10/2017 10:07  ASSESSMENT AND PLAN:    84 year old male with past medical history of previous CVA, hypertension, hyperlipidemia, GERD, glaucoma, diabetes, chronic kidney disease stage IV who presented to the hospital due to abdominal pain and noted to have a non-ST elevation MI.  1.  Non-ST elevation MI- patient is status post cardiac catheterization with drug-eluting stent to the previous bypass graft SVG to OM 2. -Continue aspirin, Plavix, atorvastatin, Imdur  2.  Acute respiratory failure with hypoxia-secondary to CHF/pulmonary edema. Off BiPAP.  Given 1 dose of high-dose IV Lasix and responded well to it.  Continue to follow renal function and response to Lasix.  Off BiPAP now continue O2 supplementation as tolerated.  3.  CHF-acute on chronic systolic dysfunction. LV EF: 35% -   40% -Patient receiving pulse doses of high-dose IV Lasix and responding to it.  Lasix 80 mg IV, transition to PO torsemide per Dr. Juleen China. - Continue Imdur.  4.  Acute on chronic renal failure- patient has CKD secondary to underlying diabetes and hypertension with proteinuria. - Baseline creatinine is around 2.2-2.5.  Patient's creatinine was as high as 6.1 during this hospitalization.  Improved and down down to 3.33 but 3.72 today, urine output has improved.  No acute need for hemodialysis as per nephrology. - Renal dose meds, avoid nephrotoxins, follow urine output.    5.  Diabetes type 2 without complication-continue sliding scale insulin, Tradjenta. - BS stable.   6.  Hyperlipidemia-continue atorvastatin.  7.  Dementia-continue Aricept.  8.  Glaucoma- cont. timolol eyedrops.  Discussed with Dr. Juleen China.  Generalized weakness.  PT evaluation suggest skilled nursing facility. All the records are reviewed and case discussed with Care Management/Social Worker. Management plans discussed with the patient, his wife and they are in agreement.  CODE STATUS: Limited Code  DVT Prophylaxis: Hep. SQ  TOTAL TIME TAKING CARE OF THIS PATIENT: 35  minutes.   POSSIBLE D/C IN 2 DAYS, DEPENDING ON CLINICAL CONDITION.   Demetrios Loll M.D on 10/11/2017 at 2:34 PM  Between 7am to 6pm - Pager - [phone]  After 6pm go to [email or web address] - Proofreader  [facility]  Office  [phone]  CC: Primary care physician; [name], MD

## 2017-10-11 NOTE — Care Management Important Message (Signed)
Copy of signed IM left with patient in room.  

## 2017-10-11 NOTE — Clinical Social Work Note (Signed)
Clinical Social Work Assessment  Patient Details  Name: Ray Lamb MRN: [id number] Date of Birth: Jul 24, 1930  Date of referral:  10/11/17               Reason for consult:  Facility Placement                Permission sought to share information with:  Family Supports, Customer service manager Permission granted to share information::  Yes, Verbal Permission Granted  Name::     Ray Lamb, Ray Lamb [phone]  [phone]   Agency::  SNF admissions  Relationship::     Contact Information:     Housing/Transportation Living arrangements for the past 2 months:  [place] of Information:  Spouse Patient Interpreter Needed:  None Criminal Activity/Legal Involvement Pertinent to Current Situation/Hospitalization:  No - Comment as needed Significant Relationships:  Spouse Lives with:  Spouse Do you feel safe going back to the place where you live?  No Need for family participation in patient care:  No (Coment)  Care giving concerns:  Patient and family feel that he needs some short term rehab before he is able to return back home.   Social Worker assessment / plan: Patient is an 84 year old male who is married, and lives with his wife.  Patient is alert and oriented x4, patient was sleeping assessment completed by speaking with patient's wife.  Patient's wife reports that patient has been to rehab before, and he was at [facility].  Patient's wife reported that patient is requesting to go back to [facility] if possible.  CSW explained to her that if for some reason [facility] is not able to accept patient they will have to consider a different option.  Patient's wife expressed understanding, but is hopeful that he can get into [facility].  CSW explained how insurance will pay for stay at SNF, and what to expect at SNF.  Patient's wife did not have have any other questions or concerns and gave CSW permission to begin bed search in [place].  Employment status:  Retired Forensic scientist:  Medicare PT Recommendations:  [place] / Referral to community resources:  [place]  Patient/Family's Response to care:  Patient and family are hopeful that he can go to [facility] for short term rehab.  Patient/Family's Understanding of and Emotional Response to Diagnosis, Current Treatment, and Prognosis:  Patient and wife are hopeful that patient will not have to be in SNF for very long.  Emotional Assessment Appearance:  Appears stated age Attitude/Demeanor/Rapport:    Affect (typically observed):  Appropriate, Stable Orientation:  Oriented to  Time, Oriented to Situation, Oriented to Place, Oriented to Self Alcohol / Substance use:  Not Applicable Psych involvement (Current and /or in the community):  No (Comment)  Discharge Needs  Concerns to be addressed:  Care Coordination, Lack of Support Readmission within the last 30 days:  No Current discharge risk:  Lack of support system Barriers to Discharge:  Continued Medical Work up   Ray Lamb 10/11/2017, 5:10 PM

## 2017-10-11 NOTE — NC FL2 (Signed)
[place] LEVEL OF CARE SCREENING TOOL     IDENTIFICATION  Patient Name: Ray Lamb Birthdate: October 19, 1930 Sex: male Admission Date (Current Location): 09/29/2017  [place] and [place] Number:  Engineering geologist and Address:  [facility], [address], [place], [place] [address]      Provider Number: [id number]  Attending Physician Name and Address:  Demetrios Loll, MD  Relative Name and Phone Number:  Edrei, Norgaard Spouse [phone]  [phone]     Current Level of Care: Hospital Recommended Level of Care: [place] Prior Approval Number:    Date Approved/Denied:   PASRR Number: [id number]  Discharge Plan: SNF    Current Diagnoses: Patient Active Problem List   Diagnosis Date Noted  . Non-STEMI (non-ST elevated myocardial infarction) ([place])   . Goals of care, counseling/discussion   . Palliative care by specialist   . Acute hypoxemic respiratory failure ([place]) 09/30/2017  . CAD (coronary artery disease) 09/04/2017  . Chronic kidney disease 09/04/2017  . Diabetes mellitus type 2, uncomplicated ([place]) 10/93/2355  . Pulmonary HTN ([place]) 09/04/2017  . Hiatal hernia 09/04/2017  . Mixed hyperlipidemia 09/04/2017  . Essential hypertension 09/04/2017  . Mitral insufficiency 06/05/2017  . Hypertension 06/05/2017  . Alzheimer's dementia 12/11/2016  . Advance care planning 05/03/2016  . Gastritis without bleeding   . Duodenitis   . Hyperlipidemia 12/03/2014  . Senile purpura ([place]) 12/03/2014  . Hypertensive heart and renal disease 09/03/2014  . 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 disease ([place]) 09/03/2014    Orientation RESPIRATION BLADDER Height & Weight     Self, Time, Situation, Place  Normal Continent Weight: 155 lb 8 oz (70.5 kg) Height:  5\' 7"  (170.2 cm)  BEHAVIORAL SYMPTOMS/MOOD NEUROLOGICAL BOWEL NUTRITION STATUS      Continent Diet(Dysphagia 3 diet)  AMBULATORY STATUS COMMUNICATION OF NEEDS  Skin   Limited Assist Verbally Normal                       Personal Care Assistance Level of Assistance  Bathing, Feeding, Dressing Bathing Assistance: Limited assistance Feeding assistance: Independent Dressing Assistance: Limited assistance     Functional Limitations Info  Sight, Speech, Hearing Sight Info: Adequate Hearing Info: Adequate Speech Info: Adequate    SPECIAL CARE FACTORS FREQUENCY  PT (By licensed PT), OT (By licensed OT)     PT Frequency: 5x a week OT Frequency: 5x a week            Contractures Contractures Info: Not present    Additional Factors Info  Code Status, Allergies Code Status Info: Full Code Allergies Info: BEE VENOM, IODINATED DIAGNOSTIC AGENTS, METRIZAMIDE            Current Medications (10/11/2017):  This is the current hospital active medication list Current Facility-Administered Medications  Medication Dose Route Frequency Provider Last Rate Last Dose  . 0.9 %  sodium chloride infusion  250 mL Intravenous PRN Isaias Cowman, MD   Stopped at 10/08/17 1703  . acetaminophen (TYLENOL) tablet 650 mg  650 mg Oral Q6H PRN Algernon Huxley, MD   650 mg at 10/01/17 2210   Or  . acetaminophen (TYLENOL) suppository 650 mg  650 mg Rectal Q6H PRN Algernon Huxley, MD      . acetaminophen (TYLENOL) tablet 650 mg  650 mg Oral Q4H PRN Paraschos, Alexander, MD      . aspirin EC tablet 81 mg  81 mg Oral Daily Leotis Pain  S, MD   81 mg at 10/11/17 0924  . atorvastatin (LIPITOR) tablet 80 mg  80 mg Oral QHS Algernon Huxley, MD   80 mg at 10/10/17 2212  . bisacodyl (DULCOLAX) EC tablet 5 mg  5 mg Oral Daily PRN Algernon Huxley, MD      . clopidogrel (PLAVIX) tablet 75 mg  75 mg Oral Q breakfast Paraschos, Alexander, MD   75 mg at 10/11/17 0924  . diphenhydrAMINE (BENADRYL) injection 12.5 mg  12.5 mg Intravenous Once Algernon Huxley, MD      . donepezil (ARICEPT) tablet 5 mg  5 mg Oral QHS Algernon Huxley, MD   5 mg at 10/10/17 2212  . famotidine (PEPCID) tablet  20 mg  20 mg Oral Daily Algernon Huxley, MD   20 mg at 10/11/17 0924  . feeding supplement (NEPRO CARB STEADY) liquid 237 mL  237 mL Oral TID BM Algernon Huxley, MD   237 mL at 10/11/17 1555  . gi cocktail (Maalox,Lidocaine,Donnatal)  30 mL Oral TID PRN Algernon Huxley, MD   30 mL at 10/02/17 0851  . heparin injection 5,000 Units  5,000 Units Subcutaneous Q8H Conforti, John, DO   5,000 Units at 10/11/17 1555  . insulin aspart (novoLOG) injection 0-15 Units  0-15 Units Subcutaneous TID WC Algernon Huxley, MD   3 Units at 10/11/17 1150  . insulin aspart (novoLOG) injection 0-5 Units  0-5 Units Subcutaneous QHS Algernon Huxley, MD   1 Units at 10/10/17 2213  . isosorbide mononitrate (IMDUR) 24 hr tablet 30 mg  30 mg Oral Daily Algernon Huxley, MD   30 mg at 10/11/17 0924  . labetalol (NORMODYNE,TRANDATE) injection 10 mg  10 mg Intravenous Q2H PRN Lance Coon, MD   10 mg at 10/07/17 2319  . linagliptin (TRADJENTA) tablet 5 mg  5 mg Oral Daily Algernon Huxley, MD   5 mg at 10/11/17 0924  . loperamide (IMODIUM) capsule 2 mg  2 mg Oral Q6H PRN Lance Coon, MD   2 mg at 10/05/17 2304  . Melatonin TABS 5 mg  5 mg Oral QHS PRN Algernon Huxley, MD   5 mg at 10/10/17 0007  . metoprolol tartrate (LOPRESSOR) tablet 50 mg  50 mg Oral Q12H Demetrios Loll, MD      . multivitamin (RENA-VIT) tablet 1 tablet  1 tablet Oral QHS Demetrios Loll, MD   1 tablet at 10/10/17 2212  . multivitamin with minerals tablet 1 tablet  1 tablet Oral Daily Algernon Huxley, MD   1 tablet at 10/11/17 [phone]  . ondansetron (ZOFRAN) tablet 4 mg  4 mg Oral Q6H PRN Algernon Huxley, MD       Or  . ondansetron (ZOFRAN) injection 4 mg  4 mg Intravenous Q6H PRN Algernon Huxley, MD   4 mg at 10/02/17 0353  . senna-docusate (Senokot-S) tablet 1 tablet  1 tablet Oral QHS PRN Algernon Huxley, MD      . sodium chloride flush (NS) 0.9 % injection 3 mL  3 mL Intravenous Q12H Paraschos, Alexander, MD   3 mL at 10/11/17 0925  . sodium chloride flush (NS) 0.9 % injection 3 mL  3 mL Intravenous  PRN Paraschos, Alexander, MD      . timolol (TIMOPTIC) 0.5 % ophthalmic solution 1 drop  1 drop Both Eyes Daily Algernon Huxley, MD   1 drop at 10/10/17 2212  . torsemide (DEMADEX) tablet 20 mg  20 mg Oral BID Lavonia Dana, MD   20 mg at 10/11/17 1135     Discharge Medications: Please see discharge summary for a list of discharge medications.  Relevant Imaging Results:  Relevant Lab Results:   Additional Information SSN [id number]  Ross Ludwig, [place]

## 2017-10-11 NOTE — Progress Notes (Signed)
[facility]  ROUNDING NOTE   Subjective:   Wife at bedside.   Patient working with SLP this morning.   UOP 2395  Objective:  Vital signs in last 24 hours:  Temp:  [97.7 F (36.5 C)-98.7 F (37.1 C)] 97.8 F (36.6 C) (08/29 0742) Pulse Rate:  [67-93] 67 (08/29 0742) Resp:  [14-18] 14 (08/29 0742) BP: (122-141)/(52-61) 141/58 (08/29 0742) SpO2:  [96 %-100 %] 99 % (08/29 0742) Weight:  [70.5 kg] 70.5 kg (08/29 0452)  Weight change: -2.666 kg Filed Weights   10/09/17 0500 10/10/17 0451 10/11/17 0452  Weight: 77.4 kg 73.2 kg 70.5 kg    Intake/Output: I/O last 3 completed shifts: In: 115.4 [I.V.:18.9; IV Piggyback:96.4] Out: 4055 [Urine:4055]   Intake/Output this shift:  Total I/O In: 243 [P.O.:240; I.V.:3] Out: 550 [Urine:550]  Physical Exam: General: NAD, sitting up in bed   Head: Normocephalic, atraumatic. Moist oral mucosal membranes  Eyes: Anicteric, PERRL  Neck: Supple, trachea midline  Lungs:  Clear   Heart: Regular rate and rhythm  Abdomen:  Soft, nontender,   Extremities: No peripheral edema.  Neurologic: Nonfocal, moving all four extremities  Skin: No lesions  Access: RIJ permcath     Basic Metabolic Panel: Recent Labs  Lab 10/04/17 2205  10/05/17 1424 10/06/17 1246  10/07/17 2307 10/08/17 0422 10/09/17 0547 10/10/17 0608 10/11/17 1041  NA  --    < >  --  132*   < > 132* 135 134* 135 135  K  --    < >  --  4.3   < > 4.3 5.0 4.7 4.2 4.1  CL  --    < >  --  93*   < > 98 97* 97* 90* 84*  CO2  --    < >  --  27   < > 27 28 30 31  36*  GLUCOSE  --    < >  --  323*   < > 242* 260* 221* 144* 212*  BUN  --    < >  --  57*   < > 47* 50* 63* 69* 79*  CREATININE  --    < >  --  5.56*   < > 3.49* 3.57* 3.45* 3.33* 3.72*  CALCIUM  --    < >  --  8.3*   < > 8.9 9.1 9.5 9.7 9.8  PHOS 4.5  --  4.6 3.3  3.3  --   --   --   --  3.5 3.6   < > = values in this interval not displayed.    Liver Function Tests: Recent Labs  Lab 10/06/17 1246  10/10/17 0608 10/11/17 1041  ALBUMIN 2.5* 2.6* 2.5*   No results for input(s): LIPASE, AMYLASE in the last 168 hours. No results for input(s): AMMONIA in the last 168 hours.  CBC: Recent Labs  Lab 10/05/17 0434 10/07/17 2307 10/09/17 0547 10/10/17 0608  WBC 14.4* 16.8* 14.8* 13.3*  NEUTROABS  --   --  10.4* 8.7*  HGB 10.6* 9.5* 8.5* 8.8*  HCT 31.0* 28.0* 24.9* 26.7*  MCV 91.3 90.9 91.1 91.5  PLT 351 334 295 365    Cardiac Enzymes: Recent Labs  Lab 10/07/17 2307  TROPONINI 9.58*    BNP: Invalid input(s): POCBNP  CBG: Recent Labs  Lab 10/10/17 1632 10/10/17 2106 10/11/17 0740 10/11/17 0856 10/11/17 1142  GLUCAP 251* 243* 157* 175* 188*    Microbiology: Results for orders placed or performed during the  hospital encounter of 09/29/17  Culture, blood (routine x 2)     Status: None   Collection Time: 09/30/17 12:58 AM  Result Value Ref Range Status   Specimen Description BLOOD BLOOD LEFT FOREARM  Final   Special Requests   Final    BOTTLES DRAWN AEROBIC AND ANAEROBIC Blood Culture adequate volume   Culture   Final    NO GROWTH 5 DAYS Performed at [facility], [address] [place], [place] [address]    Report Status 10/05/2017 FINAL  Final  Culture, blood (routine x 2)     Status: None   Collection Time: 09/30/17 12:58 AM  Result Value Ref Range Status   Specimen Description BLOOD RIGHT ARM  Final   Special Requests   Final    BOTTLES DRAWN AEROBIC AND ANAEROBIC Blood Culture adequate volume   Culture   Final    NO GROWTH 5 DAYS Performed at [facility], [address] [place], [place] [address]    Report Status 10/05/2017 FINAL  Final  MRSA PCR Screening     Status: None   Collection Time: 09/30/17  1:47 AM  Result Value Ref Range Status   MRSA by PCR NEGATIVE NEGATIVE Final    Comment:        The GeneXpert MRSA Assay (FDA approved for NASAL specimens only), is one component of a comprehensive MRSA  colonization surveillance program. It is not intended to diagnose MRSA infection nor to guide or monitor treatment for MRSA infections. Performed at [facility], [address] [place], [place] [address]     Coagulation Studies: No results for input(s): LABPROT, INR in the last 72 hours.  Urinalysis: No results for input(s): COLORURINE, LABSPEC, PHURINE, GLUCOSEU, HGBUR, BILIRUBINUR, KETONESUR, PROTEINUR, UROBILINOGEN, NITRITE, LEUKOCYTESUR in the last 72 hours.  Invalid input(s): APPERANCEUR    Imaging: Dg Chest Port 1 View  Result Date: 10/10/2017 CLINICAL DATA:  Congestive failure EXAM: PORTABLE CHEST 1 VIEW COMPARISON:  10/09/2017 FINDINGS: Cardiac shadow is stable. Postsurgical changes are noted. Dialysis catheter is again seen and stable. Mild vascular congestion is noted although somewhat improved when compared with the prior exam. Persistent effusions are noted left greater than right. Hiatal hernia is again seen. IMPRESSION: Slight improvement in vascular congestion. Small effusions are again noted. Electronically Signed   By: Inez Catalina M.D.   On: 10/10/2017 10:07     Medications:   . sodium chloride Stopped (10/08/17 1703)   . aspirin EC  81 mg Oral Daily  . atorvastatin  80 mg Oral QHS  . clopidogrel  75 mg Oral Q breakfast  . diphenhydrAMINE  12.5 mg Intravenous Once  . donepezil  5 mg Oral QHS  . famotidine  20 mg Oral Daily  . feeding supplement (NEPRO CARB STEADY)  237 mL Oral TID BM  . heparin injection (subcutaneous)  5,000 Units Subcutaneous Q8H  . insulin aspart  0-15 Units Subcutaneous TID WC  . insulin aspart  0-5 Units Subcutaneous QHS  . isosorbide mononitrate  30 mg Oral Daily  . linagliptin  5 mg Oral Daily  . metoprolol tartrate  25 mg Oral Q6H  . multivitamin  1 tablet Oral QHS  . multivitamin with minerals  1 tablet Oral Daily  . sodium chloride flush  3 mL Intravenous Q12H  . timolol  1 drop Both Eyes Daily  . torsemide  20 mg  Oral BID   sodium chloride, acetaminophen **OR** acetaminophen, acetaminophen, bisacodyl, gi cocktail, labetalol, loperamide,  Melatonin, ondansetron **OR** ondansetron (ZOFRAN) IV, senna-docusate, sodium chloride flush  Assessment/ Plan:  Ray Lamb is a 84 y.o. white male with dementia, hypertension, diabetes, coronary artery disease status post CABG, CVA, pulmonary hypertension, severe mitral regurgitation.   1. Acute renal failure on chronic kidney disease stage IV with hyperkalemia and proteinuria: baseline creatinine of 2.79, GFR of 20 on 09/13/17 Chronic kidney disease secondary to diabetes, hypertension and vascular disease Acute renal failure secondary to prerenal azotemia with diarrhea and GI losses.  No acute indication for dialysis today. Last hemodialysis was on Saturday, 8/24.  Nonoliguric urine output.  - transition to PO torsemide   - Keep dialysis permcath in for now  2. Hypertension: blood pressure at goal. Off vasopressors.  - imdur - torsemide as above.   3. Anemia of chronic kidney disease: hemoglobin 8.8  - Consider ESA evaluation as outpatient.    LOS: [place] 8/29/201912:55 PM

## 2017-10-11 NOTE — Progress Notes (Signed)
Daily Progress Note   Patient Name: Ray Lamb       Date: 10/11/2017 DOB: 08/02/1930  Age: 84 y.o. MRN#: [id number] Attending Physician: Demetrios Loll, MD Primary Care Physician: [name], MD Admit Date: 09/29/2017  Reason for Consultation/Follow-up: Establishing goals of care  Subjective: Pt sitting up in chair - has just worked with SLP and PT. Tells me he feels great. Wife at bedside.   Length of Stay: 11  Current Medications: Scheduled Meds:  . aspirin EC  81 mg Oral Daily  . atorvastatin  80 mg Oral QHS  . clopidogrel  75 mg Oral Q breakfast  . diphenhydrAMINE  12.5 mg Intravenous Once  . donepezil  5 mg Oral QHS  . famotidine  20 mg Oral Daily  . feeding supplement (NEPRO CARB STEADY)  237 mL Oral TID BM  . heparin injection (subcutaneous)  5,000 Units Subcutaneous Q8H  . insulin aspart  0-15 Units Subcutaneous TID WC  . insulin aspart  0-5 Units Subcutaneous QHS  . isosorbide mononitrate  30 mg Oral Daily  . linagliptin  5 mg Oral Daily  . metoprolol tartrate  25 mg Oral Q6H  . multivitamin  1 tablet Oral QHS  . multivitamin with minerals  1 tablet Oral Daily  . sodium chloride flush  3 mL Intravenous Q12H  . timolol  1 drop Both Eyes Daily  . torsemide  20 mg Oral BID    Continuous Infusions: . sodium chloride Stopped (10/08/17 1703)    PRN Meds: sodium chloride, acetaminophen **OR** acetaminophen, acetaminophen, bisacodyl, gi cocktail, labetalol, loperamide, Melatonin, ondansetron **OR** ondansetron (ZOFRAN) IV, senna-docusate, sodium chloride flush  Physical Exam   Constitutional: He is oriented to person, place, and time. He appears well-developed and well-nourished. No distress.  HENT:  Head: Normocephalic and atraumatic.  Cardiovascular: Normal rate and regular  rhythm.  Pulmonary/Chest: Effort normal and breath sounds normal. No respiratory distress.  Abdominal: Soft. Bowel sounds are normal.  Neurological: He is alert and oriented to person, place, and time.  Periods of confusion  Skin: Skin is warm and dry. He is not diaphoretic.  Psychiatric: He has a normal mood and affect. His behavior is normal.        Vital Signs: BP (!) 141/58 (BP Location: Left Arm)   Pulse 67   Temp 97.8 F (36.6 C) (Oral)   Resp 14   Ht 5\' 7"  (1.702 m)   Wt 70.5 kg   SpO2 99%   BMI 24.35 kg/m  SpO2: SpO2: 99 % O2 Device: O2 Device: Nasal Cannula O2 Flow Rate: O2 Flow Rate (L/min): 2 L/min  Intake/output summary:   Intake/Output Summary (Last 24 hours) at 10/11/2017 1135 Last data filed at 10/11/2017 1018 Gross per 24 hour  Intake 246 ml  Output 2370 ml  Net -2124 ml   LBM: Last BM Date: 10/09/17 Baseline Weight: Weight: 72.6 kg Most recent weight: Weight: 70.5 kg       Palliative Assessment/Data: PPS 50%    Flowsheet Rows     Most Recent Value  Intake Tab  Referral Department  Critical care  Unit at Time of Referral  ICU  Palliative Care Primary Diagnosis  Cardiac  Date Notified  10/09/17  Palliative Care Type  New Palliative care  Reason for referral  Clarify Goals of Care  Date of Admission  09/29/17  Date first seen by Palliative Care  10/10/17  # of days Palliative referral response time  1 Day(s)  # of days IP prior to Palliative referral  10  Clinical Assessment  Palliative Performance Scale Score  50%  Psychosocial & Spiritual Assessment  Palliative Care Outcomes  Patient/Family meeting held?  Yes  Who was at the meeting?  patient and son  Palliative Care Outcomes  Clarified goals of care, Linked to palliative care logitudinal support      Patient Active Problem List   Diagnosis Date Noted  . Non-STEMI (non-ST elevated myocardial infarction) ([place])   . Goals of care, counseling/discussion   . Palliative care by specialist     . Acute hypoxemic respiratory failure ([place]) 09/30/2017  . CAD (coronary artery disease) 09/04/2017  . Chronic kidney disease 09/04/2017  . Diabetes mellitus type 2, uncomplicated ([place]) 16/08/3708  . Pulmonary HTN ([place]) 09/04/2017  . Hiatal hernia 09/04/2017  . Mixed hyperlipidemia 09/04/2017  . Essential hypertension 09/04/2017  . Mitral insufficiency 06/05/2017  . Hypertension 06/05/2017  . Alzheimer's dementia 12/11/2016  . Advance care planning 05/03/2016  . Gastritis without bleeding   . Duodenitis   . Hyperlipidemia 12/03/2014  . Senile purpura ([place]) 12/03/2014  . Hypertensive heart and renal disease 09/03/2014  . 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 disease ([place]) 09/03/2014    Palliative Care Assessment & Plan   HPI: 84 y.o. male  with past medical history of CKD, DM, GERD, HLD, HTN, MI, CVA, CABGx4, dementia, and pulmonary htn admitted on 09/29/2017 with abdominal pain. Found to have NSTEMI. Troponin >65. S/p cardiac cath. Patient on and off bipap throughout admission. EF 35-40%. PMT consulted for [place].   Assessment: Follow up today with patient and first meeting with wife. Patient and wife both are easily distracted but also easily redirected. Patient also has brief periods of confusion.  Introduced palliative care to patient's wife. We discussed patient's illness - they both have good understanding. They are concerned about his weaker heart and kidneys. They are hopeful patient will not need dialysis but aware it is possible.   Discussed life review. Patient has been married to [place] for 48 years. They have 2 children and 4 grandchildren.   Discussed patient's baseline status - functional, ambulatory with cane, independent in ADLs. Wife reports poor appetite - eats large breakfast but not much the rest of the day.   They discuss plans of going to rehab with ultimate goal to return home.   Discussed with patient what quality of life means to him - he tells me  as long as he is able to get up and walk that is good quality to him.   We discussed potential dialysis in the future - he tells me if it came down to it, he would agree to long-term dialysis. He tells me his sister was on dialysis for years so he understands the implications of this.   Discussed code status - there was a lot of confusion around this topic for patient and wife. After clarifying misconceptions and some education - patient and wife elect full code. Patient tells me he would never want long term ventilator support, but would be okay with short- term. We discussed these options at length. Wife is aware of patient's wishes and we discussed responsibility of surrogate decision maker  if she ever had to make decisions for patient, discussed honoring patient's wishes.   Discussed outpatient follow up with palliative care and patient and wife agree to this and feel that it would be helpful as patient progresses through rehab.  Questions and concerns were addressed.  The family was encouraged to call with questions or concerns.   Recommendations/Plan:  Full code/full scope  Would want dialysis if needed  Would want intubation if needed  Outpatient palliative care at rehab  Goals of Care and Additional Recommendations:  Limitations on Scope of Treatment: Full Scope Treatment  Code Status:  Full code  Prognosis:   Unable to determine  Discharge Planning:  [place] for rehab with Palliative care service follow-up  Care plan was discussed with patient and wife  Thank you for allowing the Palliative Medicine Team to assist in the care of this patient.   Total Time 45 minutes Prolonged Time Billed  no       Greater than 50%  of this time was spent counseling and coordinating care related to the above assessment and plan.  Juel Burrow, DNP, [facility] Palliative Medicine Team Team Phone # [phone]  Pager [phone]

## 2017-10-12 ENCOUNTER — Encounter
Admission: RE | Admit: 2017-10-12 | Discharge: 2017-10-12 | Disposition: A | Discharge status: Home / Self Care | Payer: Medicare Other | Source: Ambulatory Visit | Attending: Internal Medicine | Admitting: Internal Medicine

## 2017-10-12 LAB — BASIC METABOLIC PANEL
ANION GAP: 17 — AB (ref 5–15)
BUN: 88 mg/dL — ABNORMAL HIGH (ref 8–23)
CHLORIDE: 83 mmol/L — AB (ref 98–111)
CO2: 37 mmol/L — AB (ref 22–32)
Calcium: 9.9 mg/dL (ref 8.9–10.3)
Creatinine, Ser: 3.97 mg/dL — ABNORMAL HIGH (ref 0.61–1.24)
GFR calc Af Amer: 15 mL/min — ABNORMAL LOW (ref >60)
GFR calc non Af Amer: 13 mL/min — ABNORMAL LOW (ref >60)
Glucose, Bld: 231 mg/dL — ABNORMAL HIGH (ref 70–99)
Potassium: 4 mmol/L (ref 3.5–5.1)
SODIUM: 137 mmol/L (ref 135–145)

## 2017-10-12 LAB — GLUCOSE, CAPILLARY
GLUCOSE-CAPILLARY: 287 mg/dL — AB (ref 70–99)
GLUCOSE-CAPILLARY: 143 mg/dL — AB (ref 70–99)
GLUCOSE-CAPILLARY: 296 mg/dL — AB (ref 70–99)
GLUCOSE-CAPILLARY: 240 mg/dL — AB (ref 70–99)

## 2017-10-12 MED ORDER — INSULIN ASPART 100 UNIT/ML ~~LOC~~ SOLN
3.0000 [IU] | Freq: Three times a day (TID) | SUBCUTANEOUS | Status: DC
  Administered 2017-10-12 – 2017-10-14 (×7): 3 [IU] via SUBCUTANEOUS
  Filled 2017-10-12 (×7): qty 1

## 2017-10-12 NOTE — Progress Notes (Signed)
[facility]  ROUNDING NOTE   Subjective:   Family at bedside. Patient sitting in chair. Has no complaints.   Creatinine 3.97 (3.72) BUN 88 (79) CO2 37 (36)  UOP 1650  Objective:  Vital signs in last 24 hours:  Temp:  [97.8 F (36.6 C)-98 F (36.7 C)] 98 F (36.7 C) (08/30 0751) Pulse Rate:  [72-86] 76 (08/30 0751) Resp:  [14-18] 18 (08/30 0430) BP: (102-127)/(50-57) 123/50 (08/30 0751) SpO2:  [91 %-99 %] 91 % (08/30 0751) Weight:  [68.3 kg] 68.3 kg (08/30 0436)  Weight change: -2.268 kg Filed Weights   10/10/17 0451 10/11/17 0452 10/12/17 0436  Weight: 73.2 kg 70.5 kg 68.3 kg    Intake/Output: I/O last 3 completed shifts: In: 53 [P.O.:240; I.V.:6; NG/GT:237] Out: 2845 [Urine:2845]   Intake/Output this shift:  Total I/O In: 480 [P.O.:480] Out: 100 [Urine:100]  Physical Exam: General: NAD, sitting up in bed   Head: Normocephalic, atraumatic. Moist oral mucosal membranes  Eyes: Anicteric, PERRL  Neck: Supple, trachea midline  Lungs:  Clear   Heart: Regular rate and rhythm  Abdomen:  Soft, nontender,   Extremities: No peripheral edema.  Neurologic: Nonfocal, moving all four extremities  Skin: No lesions  Access: RIJ permcath     Basic Metabolic Panel: Recent Labs  Lab 10/05/17 1424 10/06/17 1246  10/08/17 0422 10/09/17 0547 10/10/17 0608 10/11/17 1041 10/12/17 0438  NA  --  132*   < > 135 134* 135 135 137  K  --  4.3   < > 5.0 4.7 4.2 4.1 4.0  CL  --  93*   < > 97* 97* 90* 84* 83*  CO2  --  27   < > 28 30 31  36* 37*  GLUCOSE  --  323*   < > 260* 221* 144* 212* 231*  BUN  --  57*   < > 50* 63* 69* 79* 88*  CREATININE  --  5.56*   < > 3.57* 3.45* 3.33* 3.72* 3.97*  CALCIUM  --  8.3*   < > 9.1 9.5 9.7 9.8 9.9  PHOS 4.6 3.3  3.3  --   --   --  3.5 3.6  --    < > = values in this interval not displayed.    Liver Function Tests: Recent Labs  Lab 10/06/17 1246 10/10/17 0608 10/11/17 1041  ALBUMIN 2.5* 2.6* 2.5*   No results for  input(s): LIPASE, AMYLASE in the last 168 hours. No results for input(s): AMMONIA in the last 168 hours.  CBC: Recent Labs  Lab 10/07/17 2307 10/09/17 0547 10/10/17 0608  WBC 16.8* 14.8* 13.3*  NEUTROABS  --  10.4* 8.7*  HGB 9.5* 8.5* 8.8*  HCT 28.0* 24.9* 26.7*  MCV 90.9 91.1 91.5  PLT 334 295 365    Cardiac Enzymes: Recent Labs  Lab 10/07/17 2307  TROPONINI 9.58*    BNP: Invalid input(s): POCBNP  CBG: Recent Labs  Lab 10/11/17 1142 10/11/17 1649 10/11/17 2043 10/12/17 0747 10/12/17 1140  GLUCAP 188* 295* 252* 287* 143*    Microbiology: Results for orders placed or performed during the hospital encounter of 09/29/17  Culture, blood (routine x 2)     Status: None   Collection Time: 09/30/17 12:58 AM  Result Value Ref Range Status   Specimen Description BLOOD BLOOD LEFT FOREARM  Final   Special Requests   Final    BOTTLES DRAWN AEROBIC AND ANAEROBIC Blood Culture adequate volume   Culture   Final  NO GROWTH 5 DAYS Performed at [facility], [address] [place], [place] [address]    Report Status 10/05/2017 FINAL  Final  Culture, blood (routine x 2)     Status: None   Collection Time: 09/30/17 12:58 AM  Result Value Ref Range Status   Specimen Description BLOOD RIGHT ARM  Final   Special Requests   Final    BOTTLES DRAWN AEROBIC AND ANAEROBIC Blood Culture adequate volume   Culture   Final    NO GROWTH 5 DAYS Performed at [facility], [address] [place], [place] [address]    Report Status 10/05/2017 FINAL  Final  MRSA PCR Screening     Status: None   Collection Time: 09/30/17  1:47 AM  Result Value Ref Range Status   MRSA by PCR NEGATIVE NEGATIVE Final    Comment:        The GeneXpert MRSA Assay (FDA approved for NASAL specimens only), is one component of a comprehensive MRSA colonization surveillance program. It is not intended to diagnose MRSA infection nor to guide or monitor treatment for MRSA  infections. Performed at [facility], [address] [place], [place] [address]     Coagulation Studies: No results for input(s): LABPROT, INR in the last 72 hours.  Urinalysis: No results for input(s): COLORURINE, LABSPEC, PHURINE, GLUCOSEU, HGBUR, BILIRUBINUR, KETONESUR, PROTEINUR, UROBILINOGEN, NITRITE, LEUKOCYTESUR in the last 72 hours.  Invalid input(s): APPERANCEUR    Imaging: No results found.   Medications:   . sodium chloride Stopped (10/08/17 1703)   . aspirin EC  81 mg Oral Daily  . atorvastatin  80 mg Oral QHS  . clopidogrel  75 mg Oral Q breakfast  . diphenhydrAMINE  12.5 mg Intravenous Once  . donepezil  5 mg Oral QHS  . famotidine  20 mg Oral Daily  . feeding supplement (NEPRO CARB STEADY)  237 mL Oral TID BM  . heparin injection (subcutaneous)  5,000 Units Subcutaneous Q8H  . insulin aspart  0-15 Units Subcutaneous TID WC  . insulin aspart  0-5 Units Subcutaneous QHS  . insulin aspart  3 Units Subcutaneous TID WC  . isosorbide mononitrate  30 mg Oral Daily  . linagliptin  5 mg Oral Daily  . metoprolol tartrate  50 mg Oral Q12H  . multivitamin  1 tablet Oral QHS  . multivitamin with minerals  1 tablet Oral Daily  . sodium chloride flush  3 mL Intravenous Q12H  . timolol  1 drop Both Eyes Daily   sodium chloride, acetaminophen **OR** acetaminophen, acetaminophen, bisacodyl, gi cocktail, labetalol, loperamide, Melatonin, ondansetron **OR** ondansetron (ZOFRAN) IV, senna-docusate, sodium chloride flush  Assessment/ Plan:  [name] is a 84 y.o. white male with dementia, hypertension, diabetes, coronary artery disease status post CABG, CVA, pulmonary hypertension, severe mitral regurgitation.   1. Acute renal failure on chronic kidney disease stage IV with hyperkalemia and proteinuria: baseline creatinine of 2.79, GFR of 20 on 09/13/17 Chronic kidney disease secondary to diabetes, hypertension and vascular disease Acute renal failure  secondary to prerenal azotemia with diarrhea and GI losses.  No acute indication for dialysis today. Last hemodialysis was on Saturday, 8/24.  Nonoliguric urine output.  - hold torsemide - concern that rise in creatinine, BUN and CO2 is contraction alkalosis - Keep dialysis permcath in for now  2. Hypertension: blood pressure at goal. Off vasopressors.  - imdur - Holding diuretics.   3. Anemia of chronic kidney disease: hemoglobin 8.8  -  Consider ESA evaluation as outpatient.    LOS: 12 Tabita Corbo 8/30/201911:59 AM

## 2017-10-12 NOTE — Progress Notes (Signed)
Inpatient Diabetes Program Recommendations  AACE/ADA: New Consensus Statement on Inpatient Glycemic Control (2015)  Target Ranges:  Prepandial:   less than 140 mg/dL      Peak postprandial:   less than 180 mg/dL (1-2 hours)      Critically ill patients:  140 - 180 mg/dL    Results for BALIN, VANDEGRIFT (MRN [id number]) as of 10/12/2017 07:38  Ref. Range 10/10/2017 07:52 10/10/2017 10:48 10/10/2017 16:32 10/10/2017 21:06  Glucose-Capillary Latest Ref Range: 70 - 99 mg/dL 140 (H)  2 units NOVOLOG 113 (H)  0 units NOVOLOG 251 (H)  8 units NOVOLOG 243 (H)  1 unit NOVOLOG    Results for JOSH, NICOLOSI (MRN [id number]) as of 10/12/2017 07:38  Ref. Range 10/11/2017 08:56 10/11/2017 11:42 10/11/2017 16:49 10/11/2017 20:43  Glucose-Capillary Latest Ref Range: 70 - 99 mg/dL 175 (H)  3 units NOVOLOG 188 (H)  3 units NOVOLOG 295 (H)  8 units NOVOLOG 252 (H)  3 units NOVOLOG    Outpatient DM meds: Glipizide 5 mg daily      Januvia 25 mg daily   Current Orders: Novolog Moderate Correction Scale/ SSI (0-15 units) TID AC + HS     Tradjenta 5 mg daily      Has Dysphagic PO diet ordered.  Also getting Nepro PO supplements 237 ml TID between meals.    MD- Please consider starting low dose Novolog Meal Coverage: Novolog 3 units TID with meals  (Please add the following Hold Parameters: Hold if pt eats <50% of meal, Hold if pt NPO)     --Will follow patient during hospitalization--  Wyn Quaker RN, MSN, CDE Diabetes Coordinator Inpatient Glycemic Control Team Team Pager: [phone] (8a-5p)

## 2017-10-12 NOTE — Progress Notes (Signed)
[place] [facility] [place] NAME: Ray Lamb    MR#:  [id number]  DATE OF BIRTH:  [birth year]  SUBJECTIVE:   Patient presented to the hospital due to abdominal pain but was found to have a non-ST elevation MI.  Patient is status post cardiac catheterization with drug-eluting stent to the previous bypass of SVG to OM 2.    Patient has no complaints except for generalized weakness.  REVIEW OF SYSTEMS:    Review of Systems  Constitutional: Negative for chills and fever.  HENT: Negative for congestion and tinnitus.   Eyes: Negative for blurred vision and double vision.  Respiratory: Negative for cough, shortness of breath (Improved. ) and wheezing.   Cardiovascular: Negative for chest pain, orthopnea and PND.  Gastrointestinal: Negative for abdominal pain, diarrhea, nausea and vomiting.  Genitourinary: Negative for dysuria and hematuria.  Musculoskeletal: Negative for back pain and joint pain.  Skin: Negative for rash.  Neurological: Negative for dizziness, sensory change and focal weakness.  Psychiatric/Behavioral: Negative for depression. The patient is not nervous/anxious.   All other systems reviewed and are negative.   Nutrition: Renal/Carb control Tolerating Diet: Yes Tolerating PT: Await Eval.    DRUG ALLERGIES:   Allergies  Allergen Reactions  . Bee Venom Hives  . Iodinated Diagnostic Agents Rash  . Metrizamide Rash    VITALS:  Blood pressure (!) 123/50, pulse 76, temperature 98 F (36.7 C), temperature source Oral, resp. rate 18, height 5\' 7"  (1.702 m), weight 68.3 kg, SpO2 91 %.  PHYSICAL EXAMINATION:   Physical Exam  GENERAL:  84 y.o.-year-old patient lying in bed in no acute distress.  EYES: Pupils equal, round, reactive to light and accommodation. No scleral icterus. Extraocular muscles intact.  HEENT: Head atraumatic, normocephalic. Oropharynx and nasopharynx clear.  NECK:  Supple, no jugular venous distention. No  thyroid enlargement, no tenderness.  LUNGS: Normal breath sounds bilaterally, no wheezing, no rales or rhonchi. No use of accessory muscles of respiration.  CARDIOVASCULAR: S1, S2 normal. No murmurs, rubs, or gallops.  ABDOMEN: Soft, nontender, nondistended. Bowel sounds present. No organomegaly or mass.  EXTREMITIES: No cyanosis, clubbing or edema b/l.    NEUROLOGIC: Cranial nerves II through XII are intact. No focal Motor or sensory deficits b/l. Globally weak.    PSYCHIATRIC: The patient is alert and oriented x 3.  SKIN: No obvious rash, lesion, or ulcer.    LABORATORY PANEL:   CBC Recent Labs  Lab 10/10/17 0608  WBC 13.3*  HGB 8.8*  HCT 26.7*  PLT 365   ------------------------------------------------------------------------------------------------------------------  Chemistries  Recent Labs  Lab 10/12/17 0438  NA 137  K 4.0  CL 83*  CO2 37*  GLUCOSE 231*  BUN 88*  CREATININE 3.97*  CALCIUM 9.9   ------------------------------------------------------------------------------------------------------------------  Cardiac Enzymes Recent Labs  Lab 10/07/17 2307  TROPONINI 9.58*   ------------------------------------------------------------------------------------------------------------------  RADIOLOGY:  No results found.   ASSESSMENT AND PLAN:   84 year old male with past medical history of previous CVA, hypertension, hyperlipidemia, GERD, glaucoma, diabetes, chronic kidney disease stage IV who presented to the hospital due to abdominal pain and noted to have a non-ST elevation MI.  1.  Non-ST elevation MI- patient is status post cardiac catheterization with drug-eluting stent to the previous bypass graft SVG to OM 2. -Continue aspirin, Plavix, atorvastatin, Imdur  2.  Acute respiratory failure with hypoxia-secondary to CHF/pulmonary edema. Off BiPAP.  Given 1 dose of high-dose IV Lasix and responded well to it.  Continue to  follow renal function and  response to Lasix.  Off BiPAP and O2 supplementation.  3.  CHF-acute on chronic systolic dysfunction. LV EF: 35% -   40% -Patient receiving pulse doses of high-dose IV Lasix and responding to it.  Hold torsemide due to worsening creatinine per Dr. Juleen China. - Continue Imdur.  4.  Acute on chronic renal failure- patient has CKD secondary to underlying diabetes and hypertension with proteinuria. - Baseline creatinine is around 2.2-2.5.  Patient's creatinine was as high as 6.1 during this hospitalization.  Improved and but worsening again.  No acute need for hemodialysis as per nephrology. - Renal dose meds, avoid nephrotoxins, follow urine output.   Follow-up BMP.  5.  Diabetes type 2 without complication-continue sliding scale insulin, Tradjenta. - BS stable.   6.  Hyperlipidemia-continue atorvastatin.  7.  Dementia-continue Aricept.  8.  Glaucoma- cont. timolol eyedrops.  Discussed with Dr. Juleen China.  Generalized weakness.  PT evaluation suggest skilled nursing facility. All the records are reviewed and case discussed with Care Management/Social Worker. Management plans discussed with the patient, his son and they are in agreement.  CODE STATUS: Full Code  DVT Prophylaxis: Hep. SQ  TOTAL TIME TAKING CARE OF THIS PATIENT: 28 minutes.   POSSIBLE D/C IN 1-2 DAYS, DEPENDING ON CLINICAL CONDITION.   Demetrios Loll M.D on 10/12/2017 at 2:21 PM  Between 7am to 6pm - Pager - [phone]  After 6pm go to [email or web address] - Proofreader  [facility]  Office  [phone]  CC: Primary care physician; [name], MD

## 2017-10-12 NOTE — Clinical Social Work Note (Addendum)
CSW spoke with [facility] and they can accept patient on Sunday if he is medically ready for discharge.  Patient was informed by CSW about the news, and patient expressed that he was pleased that SNF has agreed to accept patient.  CSW to continue to facilitate discharge planning.  Ray Lamb. [place], MSW, [place]  10/12/2017 2:52 PM

## 2017-10-12 NOTE — Progress Notes (Signed)
SUBJECTIVE: Feeling well today. Pt is off oxygen, reports feeling no shortness of breath or chest pain. He is active with PT to regain strength in his legs. Requesting transfer to [facility] ([place]    Vitals:   10/11/17 1934 10/12/17 0430 10/12/17 0436 10/12/17 0751  BP: (!) 102/52 (!) 127/53  (!) 123/50  Pulse: 86 72  76  Resp: 18 18    Temp: 97.8 F (36.6 C) 97.8 F (36.6 C)  98 F (36.7 C)  TempSrc: Oral Oral  Oral  SpO2: 98% 94%  91%  Weight:   68.3 kg   Height:        Intake/Output Summary (Last 24 hours) at 10/12/2017 0901 Last data filed at 10/12/2017 0815 Gross per 24 hour  Intake 480 ml  Output 1500 ml  Net -1020 ml    LABS: Basic Metabolic Panel: Recent Labs    10/10/17 0608 10/11/17 1041 10/12/17 0438  NA 135 135 137  K 4.2 4.1 4.0  CL 90* 84* 83*  CO2 31 36* 37*  GLUCOSE 144* 212* 231*  BUN 69* 79* 88*  CREATININE 3.33* 3.72* 3.97*  CALCIUM 9.7 9.8 9.9  PHOS 3.5 3.6  --    Liver Function Tests: Recent Labs    10/10/17 0608 10/11/17 1041  ALBUMIN 2.6* 2.5*   No results for input(s): LIPASE, AMYLASE in the last 72 hours. CBC: Recent Labs    10/10/17 0608  WBC 13.3*  NEUTROABS 8.7*  HGB 8.8*  HCT 26.7*  MCV 91.5  PLT 365   Cardiac Enzymes: No results for input(s): CKTOTAL, CKMB, CKMBINDEX, TROPONINI in the last 72 hours. BNP: Invalid input(s): POCBNP D-Dimer: No results for input(s): DDIMER in the last 72 hours. Hemoglobin A1C: No results for input(s): HGBA1C in the last 72 hours. Fasting Lipid Panel: No results for input(s): CHOL, HDL, LDLCALC, TRIG, CHOLHDL, LDLDIRECT in the last 72 hours. Thyroid Function Tests: No results for input(s): TSH, T4TOTAL, T3FREE, THYROIDAB in the last 72 hours.  Invalid input(s): FREET3 Anemia Panel: No results for input(s): VITAMINB12, FOLATE, FERRITIN, TIBC, IRON, RETICCTPCT in the last 72 hours.   PHYSICAL EXAM General: Elderly man, lying in bed, pale, weak, eating breakfast. HEENT:  Normocephalic and  atramatic Neck:  No JVD.  Lungs: Clear bilaterally to auscultation and percussion. Heart: HRRR . Normal S1 and S2 without gallops or murmurs.  Abdomen: Bowel sounds are positive, abdomen soft and non-tender  Msk:  Back normal, normal gait. Normal strength and tone for age. Extremities: No clubbing, cyanosis or edema.   Neuro: Alert and oriented X 3. Psych:  Good affect, responds appropriately  TELEMETRY: NSR 83bpm  ASSESSMENT AND PLAN: S/P NSTEMI with DES placed. Pt is feeling stronger today. Awaiting discharge to SNF.  Continue plavix, aspirin, Lipitor, isosorbide, and metoprolol. Follow up outpatient with Dr. Humphrey Rolls next week.    Active Problems:   Acute hypoxemic respiratory failure (HCC)   Non-STEMI (non-ST elevated myocardial infarction) [facility])   Goals of care, counseling/discussion   Palliative care by specialist    Ray Bathe, NP-C 10/12/2017 9:01 AM Cell: [phone]

## 2017-10-12 NOTE — Progress Notes (Signed)
Speech Language Pathology Treatment: Dysphagia  Patient Details Name: Ray Lamb MRN: [id number] DOB: [birth year] Today's Date: 10/12/2017 Time: 0800-0900 SLP Time Calculation (min) (ACUTE ONLY): 60 min  Assessment / Plan / Recommendation Clinical Impression  Pt seen for ongoing assessment and toleration of diet; education on Reflux precautions; Esophageal dysmotility as impacted by Hiatal Hernia; food consistencies and food preparation in order to better manage foods w/ less Regurgitation occurring. Pt stated the GI Reflux and Regurgitation has been a problem at home; he is d/t f/u w/ GI next week per his report.  Pt consumed po's from his breakfast meal w/ no overt s/s of aspiration noted; fed self w/out difficulty. Intermittently, pt was verbally reminded to "slow down", "use smaller bites", and take "TIME" b/t bites/sips. Pt followed strategies w/out difficulty. Discussed w/ pt and Wife that these strategies were to aid Esophageal clearing and lessen Regurgitation issues hopefully.  Thorough discussion was had w/ Wife and pt per their request on education of the strategies as well as food consistencies/preparation in order to ease Esophageal clearing. Also suggested continuing swallowing Pills w/ Puree (applesauce) for easier Esophageal clearing. Many handouts were given; examples and models. Wife and pt gave good understanding of the information; aspiration and Reflux precautions given as well.   Pt appears at his baseline. He appears at reduced risk for oropharyngeal phase swallowing deficits but Esophageal phase issues appear present - Large Hiatal Hernia. Recommend f/u w/ GI for further management, education. Recommend a more mech soft type diet w/ thin liquids - avoid any foods that are problematic for pt(Esophageally). No further skilled ST services indicated at this time. NSG to reconsult if needed while admitted. Wife and pt agreed.     HPI HPI: Pt is a 84 y.o. male with a known history  of T2NIDDM, CKD IV p/w N/V/D/AP/CP. Pt is on BiPAP, and is unable to provide Hx/ROS. Hx obtained from pt's wife at bedside. She is a poor historian. She states that pt was recently started on lactulose for hyperkalemia. He took two doses, and had diarrhea x3d (Mon 08/12-Wed 08/14). He then developed N/V x3d [name] 08/15-Sat 08/17). N/V is postprandial. Pt has sharp midchest pain after PO intake. It radiates to the abdomen sometimes. The abdominal pain is described as sharp and diffuse. This has been going on x2-14mo but became acutely worse today. He has chronic indigestion and heartburn. He denies exertional CP, and can ambulate x379m w/o CP. He denied F/C, diaphoresis, night sweats, rigors, cough, SOB, LH/LOC, urinary symptoms prior to coming to the hospital. He received IVF in ED, and developed SOB/hypoxemic respiratory failure necessitating BiPAP.  Currently on [place] O2 support tolerating well.       SLP Plan  All goals met       Recommendations  Diet recommendations: Dysphagia 3 (mechanical soft);Thin liquid(easier for Esophageal clearing) Liquids provided via: Cup;No straw Medication Administration: Whole meds with puree(for easier Esophageal clearing) Supervision: Patient able to self feed Compensations: Minimize environmental distractions;Slow rate;Small sips/bites;Multiple dry swallows after each bite/sip;Follow solids with liquid Postural Changes and/or Swallow Maneuvers: Seated upright 90 degrees;Upright 30-60 min after meal(Reflux precautions)                General recommendations: (f/u w/ GI ) Oral Care Recommendations: Oral care BID;Patient independent with oral care Follow up Recommendations: None(pt appears at his baseline) SLP Visit Diagnosis: Dysphagia, unspecified (R13.10)(Esophageal dysmotility) Plan: All goals met       GO  Ray Kenner, MS, CCC-SLP Ray Lamb 10/12/2017, 1:24 PM

## 2017-10-12 NOTE — Progress Notes (Signed)
Physical Therapy Treatment Patient Details Name: Ray Lamb MRN: [id number] DOB: 1930/06/10 Today's Date: 10/12/2017    History of Present Illness Patient is an 84 year old male admitted for acute hypoxemic respiratory failure following c/o SOB yesterday.  was found to have a non-ST elevation MI.  Patient is status post cardiac catheterization with drug-eluting stent to the previous bypass of SVG to OM 2.  Experienced acute hypoxemic respiratory failure 10/09/2017.  PMH includes stroke, MI, mitral insufficiency, glaucoma, CKD and anemia.    PT Comments    Patient progressed in ambulatory distance and participation in there ex today.  He was able to demonstrate carryover with some there ex but does still appear to be confused and giddy at times.  He did not require assistance to get to EOB but appeared very unsteady when standing, requiring Min A and multiple attempts.  He is becoming more familiar with use of RW and repeats sequencing for STS transfers out loud.  PT reviewed seated HEP with pt and he was able to complete sets of 10 of all there ex.  He also ambulated 40 ft in room with RW, appearing very weak with trembling in UE's.  He required one rest seated break to bring O2 level from 88% to 95% after 60 sec. of performing deep breathing.  PT recommends that pt still use RW for safety and fall prevention at this time.  Pt is very determined to get well and enthusiastic about pt education.  Pt will continue to benefit from skilled PT with focus on improving tolerance to activity, strength, safe functional mobility and balance and fall prevention.  Follow Up Recommendations  SNF     Equipment Recommendations  None recommended by PT(TBD at next venue of care.  Pt does have a need for RW.)    Recommendations for Other Services       Precautions / Restrictions Precautions Precautions: Fall Restrictions Weight Bearing Restrictions: No    Mobility  Bed Mobility Overal bed mobility:  Modified Independent             General bed mobility comments: Increased time and use of bed rail.  Transfers Overall transfer level: Needs assistance Equipment used: Rolling walker (2 wheeled) Transfers: Sit to/from Stand Sit to Stand: Min assist         General transfer comment: Pt unsteady on feet. Able to rise on second attempt with Min A and use of RW.  Reported no dizziness.  Pt able to repeat sequencing for hand placement and general body mechanics out loud and demonstrate ability to perform safe transfer.  Ambulation/Gait Ambulation/Gait assistance: Min assist Gait Distance (Feet): 40 Feet Assistive device: Rolling walker (2 wheeled)     Gait velocity interpretation: <1.8 ft/sec, indicate of risk for recurrent falls General Gait Details: Able to ambulate to door in room with RW and one seated rest break. Pt desat to 88% and recovered to 95% with 1 min of deep breathing while sitting in chair.  He presented with low foot clearance and very hesitant gait which improved with progression of distance.  Pt's UE's were trembling as he used RW.   Stairs             Wheelchair Mobility    Modified Rankin (Stroke Patients Only)       Balance Overall balance assessment: Needs assistance Sitting-balance support: Feet supported Sitting balance-Leahy Scale: Good     Standing balance support: Bilateral upper extremity supported Standing balance-Leahy Scale: [place] Standing  balance comment: Able to stand with UE's off RW briefly to position to sit in chair.  Pt still very weak and RW recommended.                            Cognition Arousal/Alertness: Awake/alert Behavior During Therapy: WFL for tasks assessed/performed;Impulsive Overall Cognitive Status: History of cognitive impairments - at baseline                                 General Comments: Alert and oriented to self and situation.  Follows commands conisistently but appears  giddy and impulsive at times.      Exercises General Exercises - Lower Extremity Ankle Circles/Pumps: 20 reps;Strengthening;Both;Seated Quad Sets: Strengthening;Both;Supine;10 reps Short Arc Quad: Strengthening;Both;10 reps;Seated Heel Slides: Strengthening;Both;10 reps;Seated Hip ABduction/ADduction: Strengthening;Both;10 reps;Seated( pillow squeezes for adduction.) Straight Leg Raises: Strengthening;10 reps;Both;Seated Other Exercises Other Exercises: Assisted pt in planning participation in HEP as well as discussing importance of cantacting NA to walk to restroom in order to prevent deconditioning.  x8 min.    General Comments        Pertinent Vitals/Pain      Home Living                      Prior Function            PT Goals (current goals can now be found in the care plan section) Acute Rehab PT Goals Patient Stated Goal: To return to generally active lifestyle. PT Goal Formulation: With patient Time For Goal Achievement: 10/20/17 Potential to Achieve Goals: Good Progress towards PT goals: Progressing toward goals    Frequency    Min 2X/week      PT Plan Current plan remains appropriate    Co-evaluation              AM-PAC PT "6 Clicks" Daily Activity  Outcome Measure  Difficulty turning over in bed (including adjusting bedclothes, sheets and blankets)?: None Difficulty moving from lying on back to sitting on the side of the bed? : A Little Difficulty sitting down on and standing up from a chair with arms (e.g., wheelchair, bedside commode, etc,.)?: A Little Help needed moving to and from a bed to chair (including a wheelchair)?: A Little Help needed walking in hospital room?: A Little Help needed climbing 3-5 steps with a railing? : A Little 6 Click Score: 19    End of Session Equipment Utilized During Treatment: Gait belt Activity Tolerance: Patient limited by fatigue Patient left: in chair;with call bell/phone within reach;with  family/visitor present;with chair alarm set Nurse Communication: Mobility status PT Visit Diagnosis: Unsteadiness on feet (R26.81);Muscle weakness (generalized) (M62.81)     Time: 7017-7939 PT Time Calculation (min) (ACUTE ONLY): 33 min  Charges:  $Therapeutic Exercise: 8-22 mins $Therapeutic Activity: 8-22 mins                     Roxanne Gates, PT, DPT    Roxanne Gates 10/12/2017, 10:39 AM

## 2017-10-13 LAB — BASIC METABOLIC PANEL
ANION GAP: 13 (ref 5–15)
BUN: 100 mg/dL — ABNORMAL HIGH (ref 8–23)
CALCIUM: 9.6 mg/dL (ref 8.9–10.3)
CO2: 36 mmol/L — AB (ref 22–32)
Chloride: 86 mmol/L — ABNORMAL LOW (ref 98–111)
Creatinine, Ser: 3.75 mg/dL — ABNORMAL HIGH (ref 0.61–1.24)
GFR calc Af Amer: 16 mL/min — ABNORMAL LOW (ref >60)
GFR calc non Af Amer: 13 mL/min — ABNORMAL LOW (ref >60)
GLUCOSE: 215 mg/dL — AB (ref 70–99)
POTASSIUM: 4.4 mmol/L (ref 3.5–5.1)
Sodium: 135 mmol/L (ref 135–145)

## 2017-10-13 LAB — GLUCOSE, CAPILLARY
GLUCOSE-CAPILLARY: 266 mg/dL — AB (ref 70–99)
Glucose-Capillary: 229 mg/dL — ABNORMAL HIGH (ref 70–99)
Glucose-Capillary: 261 mg/dL — ABNORMAL HIGH (ref 70–99)
Glucose-Capillary: 158 mg/dL — ABNORMAL HIGH (ref 70–99)

## 2017-10-13 NOTE — Progress Notes (Signed)
[facility]  ROUNDING NOTE   Subjective:   Family at bedside.  Has no complaints.   BUN 100 (88)  Objective:  Vital signs in last 24 hours:  Temp:  [98.1 F (36.7 C)-98.6 F (37 C)] 98.2 F (36.8 C) (08/31 0738) Pulse Rate:  [65-78] 68 (08/31 0738) Resp:  [18] 18 (08/31 0738) BP: (119-137)/(47-56) 137/56 (08/31 0738) SpO2:  [95 %-97 %] 97 % (08/31 0738) Weight:  [67.8 kg] 67.8 kg (08/31 0522)  Weight change: -0.499 kg Filed Weights   10/11/17 0452 10/12/17 0436 10/13/17 0522  Weight: 70.5 kg 68.3 kg 67.8 kg    Intake/Output: I/O last 3 completed shifts: In: 45 [P.O.:480; NG/GT:237] Out: 2000 [Urine:2000]   Intake/Output this shift:  Total I/O In: -  Out: 200 [Urine:200]  Physical Exam: General: NAD, sitting up in bed   Head: Normocephalic, atraumatic. Moist oral mucosal membranes  Eyes: Anicteric, PERRL  Neck: Supple, trachea midline  Lungs:  Clear   Heart: Regular rate and rhythm  Abdomen:  Soft, nontender,   Extremities: No peripheral edema.  Neurologic: Nonfocal, moving all four extremities  Skin: No lesions  Access: RIJ permcath     Basic Metabolic Panel: Recent Labs  Lab 10/06/17 1246  10/09/17 0547 10/10/17 0608 10/11/17 1041 10/12/17 0438 10/13/17 0509  NA 132*   < > 134* 135 135 137 135  K 4.3   < > 4.7 4.2 4.1 4.0 4.4  CL 93*   < > 97* 90* 84* 83* 86*  CO2 27   < > 30 31 36* 37* 36*  GLUCOSE 323*   < > 221* 144* 212* 231* 215*  BUN 57*   < > 63* 69* 79* 88* 100*  CREATININE 5.56*   < > 3.45* 3.33* 3.72* 3.97* 3.75*  CALCIUM 8.3*   < > 9.5 9.7 9.8 9.9 9.6  PHOS 3.3  3.3  --   --  3.5 3.6  --   --    < > = values in this interval not displayed.    Liver Function Tests: Recent Labs  Lab 10/06/17 1246 10/10/17 0608 10/11/17 1041  ALBUMIN 2.5* 2.6* 2.5*   No results for input(s): LIPASE, AMYLASE in the last 168 hours. No results for input(s): AMMONIA in the last 168 hours.  CBC: Recent Labs  Lab 10/07/17 2307  10/09/17 0547 10/10/17 0608  WBC 16.8* 14.8* 13.3*  NEUTROABS  --  10.4* 8.7*  HGB 9.5* 8.5* 8.8*  HCT 28.0* 24.9* 26.7*  MCV 90.9 91.1 91.5  PLT 334 295 365    Cardiac Enzymes: Recent Labs  Lab 10/07/17 2307  TROPONINI 9.58*    BNP: Invalid input(s): POCBNP  CBG: Recent Labs  Lab 10/12/17 0747 10/12/17 1140 10/12/17 1653 10/12/17 2015 10/13/17 0741  GLUCAP 287* 143* 296* 240* 229*    Microbiology: Results for orders placed or performed during the hospital encounter of 09/29/17  Culture, blood (routine x 2)     Status: None   Collection Time: 09/30/17 12:58 AM  Result Value Ref Range Status   Specimen Description BLOOD BLOOD LEFT FOREARM  Final   Special Requests   Final    BOTTLES DRAWN AEROBIC AND ANAEROBIC Blood Culture adequate volume   Culture   Final    NO GROWTH 5 DAYS Performed at [facility], [address], [place], [place] [address]    Report Status 10/05/2017 FINAL  Final  Culture, blood (routine x 2)     Status: None  Collection Time: 09/30/17 12:58 AM  Result Value Ref Range Status   Specimen Description BLOOD RIGHT ARM  Final   Special Requests   Final    BOTTLES DRAWN AEROBIC AND ANAEROBIC Blood Culture adequate volume   Culture   Final    NO GROWTH 5 DAYS Performed at [facility], [address] [place], [place] [address]    Report Status 10/05/2017 FINAL  Final  MRSA PCR Screening     Status: None   Collection Time: 09/30/17  1:47 AM  Result Value Ref Range Status   MRSA by PCR NEGATIVE NEGATIVE Final    Comment:        The GeneXpert MRSA Assay (FDA approved for NASAL specimens only), is one component of a comprehensive MRSA colonization surveillance program. It is not intended to diagnose MRSA infection nor to guide or monitor treatment for MRSA infections. Performed at [facility], [address] [place],  [address]     Coagulation Studies: No results for input(s): LABPROT, INR in  the last 72 hours.  Urinalysis: No results for input(s): COLORURINE, LABSPEC, PHURINE, GLUCOSEU, HGBUR, BILIRUBINUR, KETONESUR, PROTEINUR, UROBILINOGEN, NITRITE, LEUKOCYTESUR in the last 72 hours.  Invalid input(s): APPERANCEUR    Imaging: No results found.   Medications:   . sodium chloride Stopped (10/08/17 1703)   . aspirin EC  81 mg Oral Daily  . atorvastatin  80 mg Oral QHS  . clopidogrel  75 mg Oral Q breakfast  . diphenhydrAMINE  12.5 mg Intravenous Once  . donepezil  5 mg Oral QHS  . famotidine  20 mg Oral Daily  . feeding supplement (NEPRO CARB STEADY)  237 mL Oral TID BM  . heparin injection (subcutaneous)  5,000 Units Subcutaneous Q8H  . insulin aspart  0-15 Units Subcutaneous TID WC  . insulin aspart  0-5 Units Subcutaneous QHS  . insulin aspart  3 Units Subcutaneous TID WC  . isosorbide mononitrate  30 mg Oral Daily  . linagliptin  5 mg Oral Daily  . metoprolol tartrate  50 mg Oral Q12H  . multivitamin  1 tablet Oral QHS  . multivitamin with minerals  1 tablet Oral Daily  . sodium chloride flush  3 mL Intravenous Q12H  . timolol  1 drop Both Eyes Daily   sodium chloride, acetaminophen **OR** acetaminophen, acetaminophen, bisacodyl, gi cocktail, labetalol, loperamide, Melatonin, ondansetron **OR** ondansetron (ZOFRAN) IV, senna-docusate, sodium chloride flush  Assessment/ Plan:  Mr. CHRISTINO MCGLINCHEY is a 84 y.o. white male with dementia, hypertension, diabetes, coronary artery disease status post CABG, CVA, pulmonary hypertension, severe mitral regurgitation.   1. Acute renal failure on chronic kidney disease stage IV with hyperkalemia and proteinuria: baseline creatinine of 2.79, GFR of 20 on 09/13/17 Chronic kidney disease secondary to diabetes, hypertension and vascular disease Acute renal failure secondary to prerenal azotemia with diarrhea and GI losses.  No acute indication for dialysis today. Last hemodialysis was on Saturday, 8/24.  Nonoliguric urine output.   - hold torsemide - concern that rise in creatinine, BUN and CO2 is contraction alkalosis - Keep dialysis permcath in for now  2. Hypertension: blood pressure at goal. Off vasopressors.  - imdur and metoprolol - Holding diuretics due to contraction alkalosis.   3. Anemia of chronic kidney disease: hemoglobin 8.8 on 8/28 - Consider ESA evaluation as outpatient.  - CBC in AM   LOS: 13 Elis Sauber 8/31/201911:06 AM

## 2017-10-13 NOTE — Progress Notes (Addendum)
[facility] at [place] NAME: Ray Lamb    MR#:  [id number]  DATE OF BIRTH:  11-25-1930  SUBJECTIVE:   Patient presented to the hospital due to abdominal pain but was found to have a non-ST elevation MI.  Patient is status post cardiac catheterization with drug-eluting stent to the previous bypass of SVG to OM 2.    Patient has no complaints except for generalized weakness.  REVIEW OF SYSTEMS:    Review of Systems  Constitutional: Negative for chills and fever.  HENT: Negative for congestion and tinnitus.   Eyes: Negative for blurred vision and double vision.  Respiratory: Negative for cough, shortness of breath (Improved. ) and wheezing.   Cardiovascular: Negative for chest pain, orthopnea and PND.  Gastrointestinal: Negative for abdominal pain, diarrhea, nausea and vomiting.  Genitourinary: Negative for dysuria and hematuria.  Musculoskeletal: Negative for back pain and joint pain.  Skin: Negative for rash.  Neurological: Negative for dizziness, sensory change and focal weakness.  Psychiatric/Behavioral: Negative for depression. The patient is not nervous/anxious.   All other systems reviewed and are negative.   Nutrition: Renal/Carb control Tolerating Diet: Yes Tolerating PT: Await Eval.    DRUG ALLERGIES:   Allergies  Allergen Reactions  . Bee Venom Hives  . Iodinated Diagnostic Agents Rash  . Metrizamide Rash    VITALS:  Blood pressure (!) 137/56, pulse 68, temperature 98.2 F (36.8 C), temperature source Oral, resp. rate 18, height 5\' 7"  (1.702 m), weight 67.8 kg, SpO2 97 %.  PHYSICAL EXAMINATION:   Physical Exam  GENERAL:  84 y.o.-year-old patient lying in bed in no acute distress.  EYES: Pupils equal, round, reactive to light and accommodation. No scleral icterus. Extraocular muscles intact.  HEENT: Head atraumatic, normocephalic. Oropharynx and nasopharynx clear.  NECK:  Supple, no jugular venous distention. No  thyroid enlargement, no tenderness.  LUNGS: Normal breath sounds bilaterally, no wheezing, no rales or rhonchi. No use of accessory muscles of respiration.  CARDIOVASCULAR: S1, S2 normal. No murmurs, rubs, or gallops.  ABDOMEN: Soft, nontender, nondistended. Bowel sounds present. No organomegaly or mass.  EXTREMITIES: No cyanosis, clubbing or edema b/l.    NEUROLOGIC: Cranial nerves II through XII are intact. No focal Motor or sensory deficits b/l. Globally weak.    PSYCHIATRIC: The patient is alert and oriented x 3.  SKIN: No obvious rash, lesion, or ulcer.    LABORATORY PANEL:   CBC Recent Labs  Lab 10/10/17 0608  WBC 13.3*  HGB 8.8*  HCT 26.7*  PLT 365   ------------------------------------------------------------------------------------------------------------------  Chemistries  Recent Labs  Lab 10/13/17 0509  NA 135  K 4.4  CL 86*  CO2 36*  GLUCOSE 215*  BUN 100*  CREATININE 3.75*  CALCIUM 9.6   ------------------------------------------------------------------------------------------------------------------  Cardiac Enzymes Recent Labs  Lab 10/07/17 2307  TROPONINI 9.58*   ------------------------------------------------------------------------------------------------------------------  RADIOLOGY:  No results found.   ASSESSMENT AND PLAN:   84 year old male with past medical history of previous CVA, hypertension, hyperlipidemia, GERD, glaucoma, diabetes, chronic kidney disease stage IV who presented to the hospital due to abdominal pain and noted to have a non-ST elevation MI.  1.  Non-ST elevation MI- patient is status post cardiac catheterization with drug-eluting stent to the previous bypass graft SVG to OM 2. -Continue aspirin, Plavix, atorvastatin, Imdur  2.  Acute respiratory failure with hypoxia-secondary to CHF/pulmonary edema. Off BiPAP.  Given 1 dose of high-dose IV Lasix and responded well to it.  Continue to  follow renal function and  response to Lasix.  Off BiPAP and O2 supplementation.  3.  CHF-acute on chronic systolic dysfunction. LV EF: 35% -   40% -Patient receiving pulse doses of high-dose IV Lasix and responding to it.  Hold torsemide due to worsening renal function per Dr. Juleen China. - Continue Imdur.  4.  Acute on chronic renal failure- patient has CKD secondary to underlying diabetes and hypertension with proteinuria. - Baseline creatinine is around 2.2-2.5.  Patient's creatinine was as high as 6.1 during this hospitalization.  Improved and but worsening again.  No acute need for hemodialysis as per nephrology. - Renal dose meds, avoid nephrotoxins, follow urine output.   BUN up to 100, hold torsemide, encourage oral fluid intake. No IVF per Dr. Juleen China. Follow-up BMP.  5.  Diabetes type 2 without complication-continue sliding scale insulin, Tradjenta. - BS stable.   6.  Hyperlipidemia-continue atorvastatin.  7.  Dementia-continue Aricept.  8.  Glaucoma- cont. timolol eyedrops.  Discussed with Dr. Juleen China.  Generalized weakness.  PT evaluation suggest skilled nursing facility. All the records are reviewed and case discussed with Care Management/Social Worker. Management plans discussed with the patient, his son and they are in agreement.  CODE STATUS: Full Code  DVT Prophylaxis: Hep. SQ  TOTAL TIME TAKING CARE OF THIS PATIENT: 28 minutes.   POSSIBLE D/C IN 1-2 DAYS, DEPENDING ON CLINICAL CONDITION.   Demetrios Loll M.D on 10/13/2017 at 1:22 PM  Between 7am to 6pm - Pager - [phone]  After 6pm go to [email or web address] - Proofreader  [facility]  Office  [phone]  CC: Primary care physician; [name], MD

## 2017-10-14 ENCOUNTER — Encounter
Admission: RE | Admit: 2017-10-14 | Discharge: 2017-10-14 | Disposition: A | Discharge status: Home / Self Care | Payer: Medicare Other | Source: Ambulatory Visit | Attending: Internal Medicine | Admitting: Internal Medicine

## 2017-10-14 DIAGNOSIS — I252 Old myocardial infarction: Secondary | ICD-10-CM | POA: Diagnosis not present

## 2017-10-14 DIAGNOSIS — E1122 Type 2 diabetes mellitus with diabetic chronic kidney disease: Secondary | ICD-10-CM | POA: Diagnosis not present

## 2017-10-14 DIAGNOSIS — I130 Hypertensive heart and chronic kidney disease with heart failure and stage 1 through stage 4 chronic kidney disease, or unspecified chronic kidney disease: Secondary | ICD-10-CM | POA: Diagnosis not present

## 2017-10-14 DIAGNOSIS — E1129 Type 2 diabetes mellitus with other diabetic kidney complication: Secondary | ICD-10-CM | POA: Diagnosis not present

## 2017-10-14 DIAGNOSIS — N186 End stage renal disease: Secondary | ICD-10-CM | POA: Diagnosis not present

## 2017-10-14 DIAGNOSIS — Z4901 Encounter for fitting and adjustment of extracorporeal dialysis catheter: Secondary | ICD-10-CM | POA: Diagnosis not present

## 2017-10-14 DIAGNOSIS — M6281 Muscle weakness (generalized): Secondary | ICD-10-CM | POA: Diagnosis not present

## 2017-10-14 DIAGNOSIS — G300 Alzheimer's disease with early onset: Secondary | ICD-10-CM | POA: Diagnosis not present

## 2017-10-14 DIAGNOSIS — I214 Non-ST elevation (NSTEMI) myocardial infarction: Secondary | ICD-10-CM | POA: Diagnosis not present

## 2017-10-14 DIAGNOSIS — F0280 Dementia in other diseases classified elsewhere without behavioral disturbance: Secondary | ICD-10-CM | POA: Diagnosis not present

## 2017-10-14 DIAGNOSIS — Z7401 Bed confinement status: Secondary | ICD-10-CM | POA: Diagnosis not present

## 2017-10-14 DIAGNOSIS — I2510 Atherosclerotic heart disease of native coronary artery without angina pectoris: Secondary | ICD-10-CM | POA: Diagnosis not present

## 2017-10-14 DIAGNOSIS — E119 Type 2 diabetes mellitus without complications: Secondary | ICD-10-CM | POA: Diagnosis not present

## 2017-10-14 DIAGNOSIS — G301 Alzheimer's disease with late onset: Secondary | ICD-10-CM | POA: Diagnosis not present

## 2017-10-14 DIAGNOSIS — E875 Hyperkalemia: Secondary | ICD-10-CM | POA: Diagnosis not present

## 2017-10-14 DIAGNOSIS — I5022 Chronic systolic (congestive) heart failure: Secondary | ICD-10-CM | POA: Diagnosis not present

## 2017-10-14 DIAGNOSIS — I272 Pulmonary hypertension, unspecified: Secondary | ICD-10-CM | POA: Diagnosis not present

## 2017-10-14 DIAGNOSIS — D631 Anemia in chronic kidney disease: Secondary | ICD-10-CM | POA: Diagnosis not present

## 2017-10-14 DIAGNOSIS — I509 Heart failure, unspecified: Secondary | ICD-10-CM | POA: Diagnosis not present

## 2017-10-14 DIAGNOSIS — I129 Hypertensive chronic kidney disease with stage 1 through stage 4 chronic kidney disease, or unspecified chronic kidney disease: Secondary | ICD-10-CM | POA: Diagnosis not present

## 2017-10-14 DIAGNOSIS — D638 Anemia in other chronic diseases classified elsewhere: Secondary | ICD-10-CM | POA: Diagnosis not present

## 2017-10-14 DIAGNOSIS — R1310 Dysphagia, unspecified: Secondary | ICD-10-CM | POA: Diagnosis not present

## 2017-10-14 DIAGNOSIS — N184 Chronic kidney disease, stage 4 (severe): Secondary | ICD-10-CM | POA: Diagnosis not present

## 2017-10-14 DIAGNOSIS — R109 Unspecified abdominal pain: Secondary | ICD-10-CM | POA: Diagnosis not present

## 2017-10-14 DIAGNOSIS — E785 Hyperlipidemia, unspecified: Secondary | ICD-10-CM | POA: Diagnosis not present

## 2017-10-14 DIAGNOSIS — Z951 Presence of aortocoronary bypass graft: Secondary | ICD-10-CM | POA: Diagnosis not present

## 2017-10-14 DIAGNOSIS — H409 Unspecified glaucoma: Secondary | ICD-10-CM | POA: Diagnosis not present

## 2017-10-14 DIAGNOSIS — R809 Proteinuria, unspecified: Secondary | ICD-10-CM | POA: Diagnosis not present

## 2017-10-14 DIAGNOSIS — Z8673 Personal history of transient ischemic attack (TIA), and cerebral infarction without residual deficits: Secondary | ICD-10-CM | POA: Diagnosis not present

## 2017-10-14 DIAGNOSIS — G4700 Insomnia, unspecified: Secondary | ICD-10-CM | POA: Diagnosis not present

## 2017-10-14 DIAGNOSIS — I25118 Atherosclerotic heart disease of native coronary artery with other forms of angina pectoris: Secondary | ICD-10-CM | POA: Diagnosis not present

## 2017-10-14 DIAGNOSIS — Z452 Encounter for adjustment and management of vascular access device: Secondary | ICD-10-CM | POA: Diagnosis not present

## 2017-10-14 DIAGNOSIS — N189 Chronic kidney disease, unspecified: Secondary | ICD-10-CM | POA: Diagnosis not present

## 2017-10-14 DIAGNOSIS — I10 Essential (primary) hypertension: Secondary | ICD-10-CM | POA: Diagnosis not present

## 2017-10-14 DIAGNOSIS — R262 Difficulty in walking, not elsewhere classified: Secondary | ICD-10-CM | POA: Diagnosis not present

## 2017-10-14 DIAGNOSIS — Z955 Presence of coronary angioplasty implant and graft: Secondary | ICD-10-CM | POA: Diagnosis not present

## 2017-10-14 DIAGNOSIS — Z7984 Long term (current) use of oral hypoglycemic drugs: Secondary | ICD-10-CM | POA: Diagnosis not present

## 2017-10-14 DIAGNOSIS — I120 Hypertensive chronic kidney disease with stage 5 chronic kidney disease or end stage renal disease: Secondary | ICD-10-CM | POA: Diagnosis not present

## 2017-10-14 DIAGNOSIS — Z87891 Personal history of nicotine dependence: Secondary | ICD-10-CM | POA: Diagnosis not present

## 2017-10-14 DIAGNOSIS — K219 Gastro-esophageal reflux disease without esophagitis: Secondary | ICD-10-CM | POA: Diagnosis not present

## 2017-10-14 DIAGNOSIS — Z7982 Long term (current) use of aspirin: Secondary | ICD-10-CM | POA: Diagnosis not present

## 2017-10-14 DIAGNOSIS — N179 Acute kidney failure, unspecified: Secondary | ICD-10-CM | POA: Diagnosis not present

## 2017-10-14 LAB — BASIC METABOLIC PANEL
ANION GAP: 13 (ref 5–15)
BUN: 95 mg/dL — ABNORMAL HIGH (ref 8–23)
CALCIUM: 9.7 mg/dL (ref 8.9–10.3)
CO2: 33 mmol/L — AB (ref 22–32)
Chloride: 87 mmol/L — ABNORMAL LOW (ref 98–111)
Creatinine, Ser: 3.7 mg/dL — ABNORMAL HIGH (ref 0.61–1.24)
GFR, EST AFRICAN AMERICAN: 16 mL/min — AB (ref >60)
GFR, EST NON AFRICAN AMERICAN: 14 mL/min — AB (ref >60)
Glucose, Bld: 163 mg/dL — ABNORMAL HIGH (ref 70–99)
Potassium: 4.3 mmol/L (ref 3.5–5.1)
SODIUM: 133 mmol/L — AB (ref 135–145)

## 2017-10-14 LAB — CBC
HCT: 25.4 % — ABNORMAL LOW (ref 40.0–52.0)
HEMOGLOBIN: 8.4 g/dL — AB (ref 13.0–18.0)
MCH: 30 pg (ref 26.0–34.0)
MCHC: 33 g/dL (ref 32.0–36.0)
MCV: 91 fL (ref 80.0–100.0)
PLATELETS: 376 10*3/uL (ref 150–440)
RBC: 2.79 MIL/uL — AB (ref 4.40–5.90)
RDW: 14.4 % (ref 11.5–14.5)
WBC: 12 10*3/uL — AB (ref 3.8–10.6)

## 2017-10-14 LAB — GLUCOSE, CAPILLARY
GLUCOSE-CAPILLARY: 172 mg/dL — AB (ref 70–99)
GLUCOSE-CAPILLARY: 217 mg/dL — AB (ref 70–99)

## 2017-10-14 MED ORDER — CLOPIDOGREL BISULFATE 75 MG PO TABS: 75.0000 mg | ORAL_TABLET | Freq: Every day | ORAL | Status: DC

## 2017-10-14 MED ORDER — SENNOSIDES-DOCUSATE SODIUM 8.6-50 MG PO TABS: 1.0000 | ORAL_TABLET | Freq: Every evening | ORAL | Status: DC | PRN

## 2017-10-14 MED ORDER — METOPROLOL TARTRATE 50 MG PO TABS: 50.0000 mg | ORAL_TABLET | Freq: Two times a day (BID) | ORAL | Status: DC

## 2017-10-14 MED ORDER — GI COCKTAIL ~~LOC~~: 30.0000 mL | Freq: Three times a day (TID) | ORAL | Status: DC | PRN

## 2017-10-14 MED ORDER — NEPRO/CARBSTEADY PO LIQD: 237.0000 mL | Freq: Three times a day (TID) | ORAL | 0 refills | Status: DC

## 2017-10-14 MED ORDER — LINAGLIPTIN 5 MG PO TABS: 5.0000 mg | ORAL_TABLET | Freq: Every day | ORAL | Status: DC

## 2017-10-14 MED ORDER — BISACODYL 5 MG PO TBEC: 5.0000 mg | DELAYED_RELEASE_TABLET | Freq: Every day | ORAL | 0 refills | Status: DC | PRN

## 2017-10-14 NOTE — Clinical Social Work Placement (Signed)
   CLINICAL SOCIAL WORK PLACEMENT  NOTE  Date:  10/14/2017  Patient Details  Name: Ray Lamb MRN: [id number] Date of Birth: 08-12-1930  Clinical Social Work is seeking post-discharge placement for this patient at the [place] level of care (*CSW will initial, date and re-position this form in  chart as items are completed):  Yes   Patient/family provided with [place] Work Department's list of facilities offering this level of care within the geographic area requested by the patient (or if unable, by the patient's family).  Yes   Patient/family informed of their freedom to choose among providers that offer the needed level of care, that participate in Medicare, Medicaid or managed care program needed by the patient, have an available bed and are willing to accept the patient.  Yes   Patient/family informed of [facility]'s ownership interest in [facility] and [facility], as well as of the fact that they are under no obligation to receive care at these facilities.  PASRR submitted to EDS on 10/11/17     PASRR number received on       Existing PASRR number confirmed on 10/11/17     FL2 transmitted to all facilities in geographic area requested by pt/family on 10/11/17     FL2 transmitted to all facilities within larger geographic area on       Patient informed that his/her managed care company has contracts with or will negotiate with certain facilities, including the following:        Yes   Patient/family informed of bed offers received.  Patient chooses bed at [facility]     Physician recommends and patient chooses bed at      Patient to be transferred to [facility] on 10/14/17.  Patient to be transferred to facility by [facility]     Patient family notified on 10/14/17 of transfer.  Name of family member notified:  [name], [phone]     PHYSICIAN Please sign FL2, Please prepare  priority discharge summary, including medications     Additional Comment:    _______________________________________________ Ross Ludwig, LCSWA 10/14/2017, 11:46 AM

## 2017-10-14 NOTE — Clinical Social Work Note (Signed)
Patient to be d/c'ed today to [facility] room 209A.  Patient and family agreeable to plans will transport via ems RN to call report [phone].  CSW updated patient's wife to inform her that patient will be discharging to [place] today.  Evette Cristal, MSW, [place]

## 2017-10-14 NOTE — Discharge Summary (Signed)
[facility] NAME: Ray Lamb    MR#:  [id number]  DATE OF BIRTH:  [birth year]  DATE OF ADMISSION:  09/29/2017   ADMITTING PHYSICIAN: Arta Silence, MD  DATE OF DISCHARGE: 10/14/2017 PRIMARY CARE PHYSICIAN: [name], MD   ADMISSION DIAGNOSIS:  Acute respiratory distress [R06.03] Colitis [K52.9] Hyponatremia [E87.1] Acute pulmonary edema (HCC) [J81.0] Elevated troponin [R74.8] Non-STEMI (non-ST elevated myocardial infarction) (HCC) [I21.4] Nausea vomiting and diarrhea [R11.2, R19.7] DISCHARGE DIAGNOSIS:  Active Problems:   Acute hypoxemic respiratory failure (HCC)   Non-STEMI (non-ST elevated myocardial infarction) ([place])   Goals of care, counseling/discussion   Palliative care by specialist  SECONDARY DIAGNOSIS:   Past Medical History:  Diagnosis Date  . Anemia   . Chronic kidney disease    STAGE 4  . Coronary atherosclerosis    CABG X 4  . Diabetes mellitus without complication ([place])   . Edema    ankle  . GERD (gastroesophageal reflux disease)   . Glaucoma   . Hearing loss   . Heart murmur   . History of hiatal hernia   . Hyperlipidemia   . Hypertension   . Mini stroke ([place])   . Mitral insufficiency   . Myocardial infarction ([place])    mild, heart cath done no stents needed  . Stroke [facility]) 2011   TIA   HOSPITAL COURSE:  84 year old male with past medical history of previous CVA, hypertension, hyperlipidemia, GERD, glaucoma, diabetes, chronic kidney disease stage IV who presented to the hospital due to abdominal pain and noted to have a non-ST elevation MI.  1.  Non-ST elevation MI- patient is status post cardiac catheterization with drug-eluting stent to the previous bypass graft SVG to OM 2. -Continue aspirin, Plavix, atorvastatin, Imdur  2.  Acute respiratory failure with hypoxia-secondary to CHF/pulmonary edema. Off BiPAP.  Given 1 dose of high-dose IV Lasix and responded well to it.   Continue to follow renal function and response to Lasix.  Off BiPAP and O2 supplementation. Improved.  3.  CHF-acute on chronic systolic dysfunction. LV EF: 35% - 40% -Patient receiving pulse doses of high-dose IV Lasix and responding to it.  Hold torsemide due to worsening renal function per Dr. Juleen China. - Continue Imdur. Lasix po prn after discharge per D.r Kolluru.  4.  Acute on chronic renal failure- patient has CKD secondary to underlying diabetes and hypertension with proteinuria. - Baseline creatinine is around 2.2-2.5.  Patient's creatinine was as high as 6.1 during this hospitalization.  Improved and but worsening again.  No acute need for hemodialysis as per nephrology. - Renal dose meds, avoid nephrotoxins, follow urine output.   Renal function is better, hold torsemide, encourage oral fluid intake. Follow-up BMP as outpatient  this coming week per Dr. Juleen China.  5.  Diabetes type 2 without complication-continue sliding scale insulin, Tradjenta. - BS stable.   6.  Hyperlipidemia-continue atorvastatin.  7.  Dementia-continue Aricept.  8.  Glaucoma- cont. timolol eyedrops.  Discussed with Dr. Juleen China.  DISCHARGE CONDITIONS:  Stable, discharge to SNF today. CONSULTS OBTAINED:  Treatment Team:  Arta Silence, MD Anthonette Legato, MD Dionisio David, MD DRUG ALLERGIES:   Allergies  Allergen Reactions  . Bee Venom Hives  . Iodinated Diagnostic Agents Rash  . Metrizamide Rash   DISCHARGE MEDICATIONS:   Allergies as of 10/14/2017      Reactions   Bee Venom Hives   Iodinated Diagnostic Agents Rash   Metrizamide Rash  Medication List    STOP taking these medications   amoxicillin 500 MG capsule Commonly known as:  AMOXIL   carvedilol 25 MG tablet Commonly known as:  COREG   digoxin 0.125 MG tablet Commonly known as:  LANOXIN   glipiZIDE 5 MG tablet Commonly known as:  GLUCOTROL   JANUVIA 25 MG tablet Generic drug:  sitaGLIPtin     metoprolol succinate 25 MG 24 hr tablet Commonly known as:  TOPROL-XL   olmesartan 20 MG tablet Commonly known as:  BENICAR     TAKE these medications   aspirin EC 81 MG tablet Take 81 mg by mouth daily.   atorvastatin 80 MG tablet Commonly known as:  LIPITOR Take 1 tablet (80 mg total) by mouth at bedtime.   bisacodyl 5 MG EC tablet Commonly known as:  DULCOLAX Take 1 tablet (5 mg total) by mouth daily as needed for moderate constipation.   clopidogrel 75 MG tablet Commonly known as:  PLAVIX Take 1 tablet (75 mg total) by mouth daily with breakfast. Start taking on:  10/15/2017   donepezil 5 MG tablet Commonly known as:  ARICEPT Take 5 mg by mouth at bedtime.   ezetimibe 10 MG tablet Commonly known as:  ZETIA Take 1 tablet (10 mg total) by mouth daily.   feeding supplement (NEPRO CARB STEADY) Liqd Take 237 mLs by mouth 3 (three) times daily between meals.   furosemide 40 MG tablet Commonly known as:  LASIX Take 40 mg by mouth daily as needed.   gi cocktail Susp suspension Take 30 mLs by mouth 3 (three) times daily as needed for indigestion. Shake well.   isosorbide mononitrate 30 MG 24 hr tablet Commonly known as:  IMDUR   linagliptin 5 MG Tabs tablet Commonly known as:  TRADJENTA Take 1 tablet (5 mg total) by mouth daily. Start taking on:  10/15/2017   metoprolol tartrate 50 MG tablet Commonly known as:  LOPRESSOR Take 1 tablet (50 mg total) by mouth every 12 (twelve) hours.   mometasone 0.1 % lotion Commonly known as:  ELOCON APPLY 4 DROPS INTO EACH EAR DAILY FOR 10 DAYS THEN AS NEEDED FOR DRY SKIN AND ITCHING   multivitamin tablet Take 1 tablet by mouth daily.   senna-docusate 8.6-50 MG tablet Commonly known as:  Senokot-S Take 1 tablet by mouth at bedtime as needed for mild constipation.   timolol 0.5 % ophthalmic solution Commonly known as:  TIMOPTIC Place 1 drop into both eyes daily.   TURMERIC PO Take 1,300 mg by mouth.         DISCHARGE INSTRUCTIONS:  See AVS.  If you experience worsening of your admission symptoms, develop shortness of breath, life threatening emergency, suicidal or homicidal thoughts you must seek medical attention immediately by calling 911 or calling your MD immediately  if symptoms less severe.  You Must read complete instructions/literature along with all the possible adverse reactions/side effects for all the Medicines you take and that have been prescribed to you. Take any new Medicines after you have completely understood and accpet all the possible adverse reactions/side effects.   Please note  You were cared for by a hospitalist during your hospital stay. If you have any questions about your discharge medications or the care you received while you were in the hospital after you are discharged, you can call the unit and asked to speak with the hospitalist on call if the hospitalist that took care of you is not available. Once you are discharged,  your primary care physician will handle any further medical issues. Please note that NO REFILLS for any discharge medications will be authorized once you are discharged, as it is imperative that you return to your primary care physician (or establish a relationship with a primary care physician if you do not have one) for your aftercare needs so that they can reassess your need for medications and monitor your lab values.    On the day of Discharge:  VITAL SIGNS:  Blood pressure (!) 123/49, pulse 63, temperature 97.6 F (36.4 C), temperature source Oral, resp. rate 18, height 5\' 7"  (1.702 m), weight 69.7 kg, SpO2 99 %. PHYSICAL EXAMINATION:  GENERAL:  84 y.o.-year-old patient lying in the bed with no acute distress.  EYES: Pupils equal, round, reactive to light and accommodation. No scleral icterus. Extraocular muscles intact.  HEENT: Head atraumatic, normocephalic. Oropharynx and nasopharynx clear.  NECK:  Supple, no jugular venous  distention. No thyroid enlargement, no tenderness.  LUNGS: Normal breath sounds bilaterally, no wheezing, rales,rhonchi or crepitation. No use of accessory muscles of respiration.  CARDIOVASCULAR: S1, S2 normal. No murmurs, rubs, or gallops.  ABDOMEN: Soft, non-tender, non-distended. Bowel sounds present. No organomegaly or mass.  EXTREMITIES: No pedal edema, cyanosis, or clubbing.  NEUROLOGIC: Cranial nerves II through XII are intact. Muscle strength 4/5 in all extremities. Sensation intact. Gait not checked.  PSYCHIATRIC: The patient is alert and oriented x 3.  SKIN: No obvious rash, lesion, or ulcer.  DATA REVIEW:   CBC Recent Labs  Lab 10/14/17 0631  WBC 12.0*  HGB 8.4*  HCT 25.4*  PLT 376    Chemistries  Recent Labs  Lab 10/14/17 0631  NA 133*  K 4.3  CL 87*  CO2 33*  GLUCOSE 163*  BUN 95*  CREATININE 3.70*  CALCIUM 9.7     Microbiology Results  Results for orders placed or performed during the hospital encounter of 09/29/17  Culture, blood (routine x 2)     Status: None   Collection Time: 09/30/17 12:58 AM  Result Value Ref Range Status   Specimen Description BLOOD BLOOD LEFT FOREARM  Final   Special Requests   Final    BOTTLES DRAWN AEROBIC AND ANAEROBIC Blood Culture adequate volume   Culture   Final    NO GROWTH 5 DAYS Performed at [facility], [address] [place], [place] [address]    Report Status 10/05/2017 FINAL  Final  Culture, blood (routine x 2)     Status: None   Collection Time: 09/30/17 12:58 AM  Result Value Ref Range Status   Specimen Description BLOOD RIGHT ARM  Final   Special Requests   Final    BOTTLES DRAWN AEROBIC AND ANAEROBIC Blood Culture adequate volume   Culture   Final    NO GROWTH 5 DAYS Performed at [facility], [address] [place], [place] [address]    Report Status 10/05/2017 FINAL  Final  MRSA PCR Screening     Status: None   Collection Time: 09/30/17  1:47 AM  Result Value Ref Range  Status   MRSA by PCR NEGATIVE NEGATIVE Final    Comment:        The GeneXpert MRSA Assay (FDA approved for NASAL specimens only), is one component of a comprehensive MRSA colonization surveillance program. It is not intended to diagnose MRSA infection nor to guide or monitor treatment for MRSA infections. Performed at [facility], [address], [place], [place] [address]  RADIOLOGY:  No results found.   Management plans discussed with the patient, family and they are in agreement.  CODE STATUS: Full Code   TOTAL TIME TAKING CARE OF THIS PATIENT: 36 minutes.    Demetrios Loll M.D on 10/14/2017 at 10:39 AM  Between 7am to 6pm - Pager - [phone]  After 6pm go to [email or web address] - Proofreader  [facility]  Office  [phone]  CC: Primary care physician; [name], MD   Note: This dictation was prepared with Dragon dictation along with smaller phrase technology. Any transcriptional errors that result from this process are unintentional.

## 2017-10-14 NOTE — Progress Notes (Signed)
[facility]  ROUNDING NOTE   Subjective:   Has no complaints.   Objective:  Vital signs in last 24 hours:  Temp:  [97.6 F (36.4 C)-98.3 F (36.8 C)] 97.6 F (36.4 C) (09/01 0745) Pulse Rate:  [60-69] 63 (09/01 0745) Resp:  [18] 18 (09/01 0745) BP: (119-123)/(49-61) 123/49 (09/01 0745) SpO2:  [97 %-99 %] 99 % (09/01 0745) Weight:  [69.7 kg] 69.7 kg (09/01 0517)  Weight change: 1.905 kg Filed Weights   10/12/17 0436 10/13/17 0522 10/14/17 0517  Weight: 68.3 kg 67.8 kg 69.7 kg    Intake/Output: I/O last 3 completed shifts: In: 480 [P.O.:480] Out: 2200 [Urine:2200]   Intake/Output this shift:  Total I/O In: 480 [P.O.:480] Out: -   Physical Exam: General: NAD, sitting up in bed   Head: Normocephalic, atraumatic. Moist oral mucosal membranes  Eyes: Anicteric, PERRL  Neck: Supple, trachea midline  Lungs:  Clear   Heart: Regular rate and rhythm  Abdomen:  Soft, nontender,   Extremities: No peripheral edema.  Neurologic: Nonfocal, moving all four extremities  Skin: No lesions  Access: RIJ permcath     Basic Metabolic Panel: Recent Labs  Lab 10/10/17 0608 10/11/17 1041 10/12/17 0438 10/13/17 0509 10/14/17 0631  NA 135 135 137 135 133*  K 4.2 4.1 4.0 4.4 4.3  CL 90* 84* 83* 86* 87*  CO2 31 36* 37* 36* 33*  GLUCOSE 144* 212* 231* 215* 163*  BUN 69* 79* 88* 100* 95*  CREATININE 3.33* 3.72* 3.97* 3.75* 3.70*  CALCIUM 9.7 9.8 9.9 9.6 9.7  PHOS 3.5 3.6  --   --   --     Liver Function Tests: Recent Labs  Lab 10/10/17 0608 10/11/17 1041  ALBUMIN 2.6* 2.5*   No results for input(s): LIPASE, AMYLASE in the last 168 hours. No results for input(s): AMMONIA in the last 168 hours.  CBC: Recent Labs  Lab 10/07/17 2307 10/09/17 0547 10/10/17 0608 10/14/17 0631  WBC 16.8* 14.8* 13.3* 12.0*  NEUTROABS  --  10.4* 8.7*  --   HGB 9.5* 8.5* 8.8* 8.4*  HCT 28.0* 24.9* 26.7* 25.4*  MCV 90.9 91.1 91.5 91.0  PLT 334 295 365 376    Cardiac  Enzymes: Recent Labs  Lab 10/07/17 2307  TROPONINI 9.58*    BNP: Invalid input(s): POCBNP  CBG: Recent Labs  Lab 10/13/17 0741 10/13/17 1200 10/13/17 1644 10/13/17 2048 10/14/17 0817  GLUCAP 229* 261* 266* 158* 172*    Microbiology: Results for orders placed or performed during the hospital encounter of 09/29/17  Culture, blood (routine x 2)     Status: None   Collection Time: 09/30/17 12:58 AM  Result Value Ref Range Status   Specimen Description BLOOD BLOOD LEFT FOREARM  Final   Special Requests   Final    BOTTLES DRAWN AEROBIC AND ANAEROBIC Blood Culture adequate volume   Culture   Final    NO GROWTH 5 DAYS Performed at [facility], [address] [place], [place] [address]    Report Status 10/05/2017 FINAL  Final  Culture, blood (routine x 2)     Status: None   Collection Time: 09/30/17 12:58 AM  Result Value Ref Range Status   Specimen Description BLOOD RIGHT ARM  Final   Special Requests   Final    BOTTLES DRAWN AEROBIC AND ANAEROBIC Blood Culture adequate volume   Culture   Final    NO GROWTH 5 DAYS Performed at [facility], [address],  [place], [place] [address]    Report Status 10/05/2017 FINAL  Final  MRSA PCR Screening     Status: None   Collection Time: 09/30/17  1:47 AM  Result Value Ref Range Status   MRSA by PCR NEGATIVE NEGATIVE Final    Comment:        The GeneXpert MRSA Assay (FDA approved for NASAL specimens only), is one component of a comprehensive MRSA colonization surveillance program. It is not intended to diagnose MRSA infection nor to guide or monitor treatment for MRSA infections. Performed at [facility], [address] [place], [place] [address]     Coagulation Studies: No results for input(s): LABPROT, INR in the last 72 hours.  Urinalysis: No results for input(s): COLORURINE, LABSPEC, PHURINE, GLUCOSEU, HGBUR, BILIRUBINUR, KETONESUR, PROTEINUR, UROBILINOGEN, NITRITE, LEUKOCYTESUR  in the last 72 hours.  Invalid input(s): APPERANCEUR    Imaging: No results found.   Medications:   . sodium chloride Stopped (10/08/17 1703)   . aspirin EC  81 mg Oral Daily  . atorvastatin  80 mg Oral QHS  . clopidogrel  75 mg Oral Q breakfast  . diphenhydrAMINE  12.5 mg Intravenous Once  . donepezil  5 mg Oral QHS  . famotidine  20 mg Oral Daily  . feeding supplement (NEPRO CARB STEADY)  237 mL Oral TID BM  . heparin injection (subcutaneous)  5,000 Units Subcutaneous Q8H  . insulin aspart  0-15 Units Subcutaneous TID WC  . insulin aspart  0-5 Units Subcutaneous QHS  . insulin aspart  3 Units Subcutaneous TID WC  . isosorbide mononitrate  30 mg Oral Daily  . linagliptin  5 mg Oral Daily  . metoprolol tartrate  50 mg Oral Q12H  . multivitamin  1 tablet Oral QHS  . multivitamin with minerals  1 tablet Oral Daily  . sodium chloride flush  3 mL Intravenous Q12H  . timolol  1 drop Both Eyes Daily   sodium chloride, acetaminophen **OR** acetaminophen, acetaminophen, bisacodyl, gi cocktail, labetalol, loperamide, Melatonin, ondansetron **OR** ondansetron (ZOFRAN) IV, senna-docusate, sodium chloride flush  Assessment/ Plan:  [name] is a 84 y.o. white male with dementia, hypertension, diabetes, coronary artery disease status post CABG, CVA, pulmonary hypertension, severe mitral regurgitation.   1. Acute renal failure on chronic kidney disease stage IV with hyperkalemia and proteinuria: baseline creatinine of 2.79, GFR of 20 on 09/13/17 Chronic kidney disease secondary to diabetes, hypertension and vascular disease Acute renal failure secondary to prerenal azotemia with diarrhea and GI losses.  No acute indication for dialysis today. Last hemodialysis was on Saturday, 8/24.  Nonoliguric urine output.  - May restart home furosemide PRN order. No indication for diuretics today.  - Keep dialysis permcath in for now - Recommend labs next week.   2. Hypertension: blood  pressure at goal.  - imdur and metoprolol - furosemide as above.    3. Anemia of chronic kidney disease: hemoglobin 8.4 - Consider ESA evaluation as outpatient.   Nephrology follow up with Dr. Candiss Norse in 1-2 weeks.    LOS: [place] 9/1/201910:43 AM

## 2017-10-14 NOTE — Discharge Instructions (Signed)
-   Fall precaution 

## 2017-10-16 ENCOUNTER — Other Ambulatory Visit
Admission: RE | Admit: 2017-10-16 | Discharge: 2017-10-16 | Disposition: A | Discharge status: Home / Self Care | Payer: Medicare Other | Source: Ambulatory Visit | Attending: Internal Medicine | Admitting: Internal Medicine

## 2017-10-16 DIAGNOSIS — N189 Chronic kidney disease, unspecified: Secondary | ICD-10-CM | POA: Insufficient documentation

## 2017-10-16 DIAGNOSIS — N184 Chronic kidney disease, stage 4 (severe): Secondary | ICD-10-CM | POA: Diagnosis not present

## 2017-10-16 DIAGNOSIS — I5022 Chronic systolic (congestive) heart failure: Secondary | ICD-10-CM | POA: Insufficient documentation

## 2017-10-16 DIAGNOSIS — F0280 Dementia in other diseases classified elsewhere without behavioral disturbance: Secondary | ICD-10-CM | POA: Diagnosis not present

## 2017-10-16 DIAGNOSIS — I25118 Atherosclerotic heart disease of native coronary artery with other forms of angina pectoris: Secondary | ICD-10-CM | POA: Diagnosis not present

## 2017-10-16 DIAGNOSIS — G301 Alzheimer's disease with late onset: Secondary | ICD-10-CM | POA: Diagnosis not present

## 2017-10-16 DIAGNOSIS — E119 Type 2 diabetes mellitus without complications: Secondary | ICD-10-CM | POA: Diagnosis not present

## 2017-10-16 LAB — BASIC METABOLIC PANEL
ANION GAP: 9 (ref 5–15)
BUN: 85 mg/dL — ABNORMAL HIGH (ref 8–23)
CALCIUM: 9.7 mg/dL (ref 8.9–10.3)
CO2: 29 mmol/L (ref 22–32)
Chloride: 98 mmol/L (ref 98–111)
Creatinine, Ser: 3.48 mg/dL — ABNORMAL HIGH (ref 0.61–1.24)
GFR calc non Af Amer: 15 mL/min — ABNORMAL LOW (ref >60)
GFR, EST AFRICAN AMERICAN: 17 mL/min — AB (ref >60)
Glucose, Bld: 139 mg/dL — ABNORMAL HIGH (ref 70–99)
Potassium: 5.5 mmol/L — ABNORMAL HIGH (ref 3.5–5.1)
SODIUM: 136 mmol/L (ref 135–145)

## 2017-10-18 ENCOUNTER — Other Ambulatory Visit
Admission: RE | Admit: 2017-10-18 | Discharge: 2017-10-18 | Disposition: A | Discharge status: Home / Self Care | Payer: Medicare Other | Source: Ambulatory Visit | Attending: Internal Medicine | Admitting: Internal Medicine

## 2017-10-18 DIAGNOSIS — I509 Heart failure, unspecified: Secondary | ICD-10-CM | POA: Insufficient documentation

## 2017-10-18 DIAGNOSIS — N189 Chronic kidney disease, unspecified: Secondary | ICD-10-CM | POA: Insufficient documentation

## 2017-10-18 LAB — BASIC METABOLIC PANEL
Anion gap: 8 (ref 5–15)
BUN: 88 mg/dL — ABNORMAL HIGH (ref 8–23)
CHLORIDE: 98 mmol/L (ref 98–111)
CO2: 28 mmol/L (ref 22–32)
CREATININE: 3.09 mg/dL — AB (ref 0.61–1.24)
Calcium: 9.4 mg/dL (ref 8.9–10.3)
GFR calc Af Amer: 20 mL/min — ABNORMAL LOW (ref >60)
GFR calc non Af Amer: 17 mL/min — ABNORMAL LOW (ref >60)
Glucose, Bld: 177 mg/dL — ABNORMAL HIGH (ref 70–99)
POTASSIUM: 5.4 mmol/L — AB (ref 3.5–5.1)
SODIUM: 134 mmol/L — AB (ref 135–145)

## 2017-10-18 LAB — DIGOXIN LEVEL: Digoxin Level: <0.2 ng/mL — ABNORMAL LOW (ref 0.8–2.0)

## 2017-10-23 ENCOUNTER — Encounter: Payer: Self-pay | Admitting: Adult Health

## 2017-10-23 ENCOUNTER — Non-Acute Institutional Stay (SKILLED_NURSING_FACILITY): Payer: Medicare Other | Admitting: Adult Health

## 2017-10-23 ENCOUNTER — Other Ambulatory Visit
Admission: RE | Admit: 2017-10-23 | Discharge: 2017-10-23 | Disposition: A | Discharge status: Home / Self Care | Payer: Medicare Other | Source: Ambulatory Visit | Attending: Adult Health | Admitting: Adult Health

## 2017-10-23 DIAGNOSIS — N184 Chronic kidney disease, stage 4 (severe): Secondary | ICD-10-CM

## 2017-10-23 DIAGNOSIS — N189 Chronic kidney disease, unspecified: Secondary | ICD-10-CM | POA: Insufficient documentation

## 2017-10-23 DIAGNOSIS — E785 Hyperlipidemia, unspecified: Secondary | ICD-10-CM

## 2017-10-23 DIAGNOSIS — N179 Acute kidney failure, unspecified: Secondary | ICD-10-CM

## 2017-10-23 DIAGNOSIS — E1122 Type 2 diabetes mellitus with diabetic chronic kidney disease: Secondary | ICD-10-CM | POA: Diagnosis not present

## 2017-10-23 DIAGNOSIS — D638 Anemia in other chronic diseases classified elsewhere: Secondary | ICD-10-CM

## 2017-10-23 DIAGNOSIS — I214 Non-ST elevation (NSTEMI) myocardial infarction: Secondary | ICD-10-CM

## 2017-10-23 DIAGNOSIS — I5022 Chronic systolic (congestive) heart failure: Secondary | ICD-10-CM | POA: Diagnosis not present

## 2017-10-23 DIAGNOSIS — F0280 Dementia in other diseases classified elsewhere without behavioral disturbance: Secondary | ICD-10-CM | POA: Diagnosis not present

## 2017-10-23 DIAGNOSIS — G4700 Insomnia, unspecified: Secondary | ICD-10-CM | POA: Diagnosis not present

## 2017-10-23 DIAGNOSIS — G300 Alzheimer's disease with early onset: Secondary | ICD-10-CM

## 2017-10-23 LAB — BASIC METABOLIC PANEL
Anion gap: 8 (ref 5–15)
BUN: 76 mg/dL — ABNORMAL HIGH (ref 8–23)
CALCIUM: 9.1 mg/dL (ref 8.9–10.3)
CO2: 26 mmol/L (ref 22–32)
CREATININE: 2.56 mg/dL — AB (ref 0.61–1.24)
Chloride: 97 mmol/L — ABNORMAL LOW (ref 98–111)
GFR, EST AFRICAN AMERICAN: 25 mL/min — AB (ref >60)
GFR, EST NON AFRICAN AMERICAN: 21 mL/min — AB (ref >60)
Glucose, Bld: 224 mg/dL — ABNORMAL HIGH (ref 70–99)
Potassium: 5.6 mmol/L — ABNORMAL HIGH (ref 3.5–5.1)
Sodium: 131 mmol/L — ABNORMAL LOW (ref 135–145)

## 2017-10-23 NOTE — Progress Notes (Signed)
Location:  [facility] Number: [place] of Service:  SNF ([phone]) Provider:  Durenda Age, NP  Patient Care Team: [name], MD as PCP - General (Family Medicine) [name], MD as PCP - Family Medicine (Family Medicine) Murlean Iba, MD (Internal Medicine) Dionisio David, MD as Consulting Physician (Cardiology) Sharlotte Alamo, DPM (Podiatry) Fuller Mandril., MD (Dentistry) Lucilla Lame, MD as Consulting Physician (Gastroenterology) Vladimir Crofts, MD as Consulting Physician (Neurology)  Extended Emergency Contact Information Primary Emergency Contact: Artemio, Dobie Address: [address]          [place], [place] [address] Johnnette Litter of [place] Phone: [phone] Mobile Phone: [phone] Relation: Spouse  Code Status:  FULL  Goals of care: Advanced Directive information Advanced Directives 10/23/2017  Does Patient Have a Medical Advance Directive? No  Would patient like information on creating a medical advance directive? No - Patient declined     Chief Complaint  Patient presents with  . Medical Management of Chronic Issues    Weekly follow-up for the first 30 days Post Hospitalization    HPI:  Pt is a 84 y.o. male seen today for weekly follow up. He was seen in the room today. He complained of insomnia. Latest creatinine showed 2.56 and previous creatinine 3.09 (10/18/17). No SOB has been noted today.    He has been admitted to [facility] on 10/14/17 from a recent hospitalization due to NSTEMI S/P cardiac catheterization with drug-eluting stent to the previous bypasse graft. He.had acute respiratory failure with hypoxia secondary to CHF/pulmonary edema and was given IV Lasix He had acute renal failure on chronickidney disease stage IV. A permcath was place and had hemodialysis on 8/24. He has PMH of history of previous CVA, hypertension, hyperlipidemia, GERD, glaucoma, diabetes and CKD stage IV.  He is currently having a short-term rehabilitation.   Past Medical History:  Diagnosis Date  . Anemia   . Chronic kidney disease    STAGE 4  . Coronary atherosclerosis    CABG X 4  . Diabetes mellitus without complication ([place])   . Edema    ankle  . GERD (gastroesophageal reflux disease)   . Glaucoma   . Hearing loss   . Heart murmur   . History of hiatal hernia   . Hyperlipidemia   . Hypertension   . Mini stroke ([place])   . Mitral insufficiency   . Myocardial infarction ([place])    mild, heart cath done no stents needed  . Stroke [facility]) 2011   TIA   Past Surgical History:  Procedure Laterality Date  . CATARACT EXTRACTION W/PHACO Left 09/05/2016   Procedure: CATARACT EXTRACTION PHACO AND INTRAOCULAR LENS PLACEMENT (IOC);  Surgeon: Birder Robson, MD;  Location: [facility] ORS;  Service: Ophthalmology;  Laterality: Left;  [place] 00:43.1 AP% 22.6 CDE 9.77 Fluid Pack lot #[id number]  . CATARACT EXTRACTION W/PHACO Right 09/26/2016   Procedure: CATARACT EXTRACTION PHACO AND INTRAOCULAR LENS PLACEMENT ([place]);  Surgeon: Birder Robson, MD;  Location: [facility] ORS;  Service: Ophthalmology;  Laterality: Right;  [place] 01:00 AP% 16.0 CDE 9.71 Fluid pack lot # [id number]  . CORONARY ARTERY BYPASS GRAFT  2003  . DIALYSIS/PERMA CATHETER INSERTION N/A 10/04/2017   Procedure: DIALYSIS/PERMA CATHETER INSERTION;  Surgeon: Algernon Huxley, MD;  Location: [facility];  Service: Cardiovascular;  Laterality: N/A;  . ESOPHAGOGASTRODUODENOSCOPY (EGD) WITH PROPOFOL N/A 04/25/2016   Procedure: ESOPHAGOGASTRODUODENOSCOPY (EGD) WITH PROPOFOL;  Surgeon: Lucilla Lame, MD;  Location:  [facility];  Service: Endoscopy;  Laterality: N/A;  . JOINT REPLACEMENT Right 2010   knee  . LEFT HEART CATH AND CORONARY ANGIOGRAPHY N/A 10/04/2017   Procedure: LEFT HEART CATH AND CORONARY ANGIOGRAPHY;  Surgeon: Isaias Cowman, MD;  Location: [facility];  Service: Cardiovascular;  Laterality: N/A;  . SKIN CANCER  EXCISION      Allergies  Allergen Reactions  . Bee Venom Hives  . Iodinated Diagnostic Agents Rash  . Metrizamide Rash  . Other Rash    Dye    Outpatient Encounter Medications as of 10/23/2017  Medication Sig  . acetaminophen (TYLENOL) 325 MG tablet Take 650 mg by mouth every 4 (four) hours as needed. for pain/ increased temp. May be administered orally, per G-tube if needed or rectally if unable to swallow (separate order). Maximum dose for 24 hours is 3,000 mg from all sources of Acetaminophen/ Tylenol  . Alum & Mag Hydroxide-Simeth (GI COCKTAIL) SUSP suspension Take 30 mLs by mouth 3 (three) times daily as needed for indigestion. Shake well.  . aspirin EC 81 MG tablet Take 81 mg by mouth daily.  Marland Kitchen atorvastatin (LIPITOR) 80 MG tablet Take 1 tablet (80 mg total) by mouth at bedtime.  . bisacodyl (DULCOLAX) 5 MG EC tablet Take 1 tablet (5 mg total) by mouth daily as needed for moderate constipation.  . clopidogrel (PLAVIX) 75 MG tablet Take 1 tablet (75 mg total) by mouth daily with breakfast.  . donepezil (ARICEPT) 5 MG tablet Take 5 mg by mouth at bedtime.  Marland Kitchen ezetimibe (ZETIA) 10 MG tablet Take 1 tablet (10 mg total) by mouth daily.  . furosemide (LASIX) 40 MG tablet Take 40 mg by mouth daily as needed.   . insulin aspart (NOVOLOG) 100 UNIT/ML injection Inject 0-15 Units into the skin 3 (three) times daily before meals. If Blood Sugar is 70 to 120, give 0 Units. If Blood Sugar is 121 to 150, give 2 Units. If Blood Sugar is 151 to 200, give 3 Units. If Blood Sugar is 201 to 250, give 5 Units. If Blood Sugar is 251 to 300, give 8 Units. If Blood Sugar is 301 to 350, give 11 Units. If Blood Sugar is 351 to 400, give 15 Units. If Blood Sugar is greater than 400, call MD. subcutaneous  . isosorbide mononitrate (IMDUR) 30 MG 24 hr tablet Take 30 mg by mouth daily.   Marland Kitchen linagliptin (TRADJENTA) 5 MG TABS tablet Take 1 tablet (5 mg total) by mouth daily.  . metoprolol tartrate (LOPRESSOR) 50  MG tablet Take 1 tablet (50 mg total) by mouth every 12 (twelve) hours.  . mometasone (ELOCON) 0.1 % cream Apply 1 application topically daily as needed. FOR DRY SKIN AND ITCHING  . mometasone (ELOCON) 0.1 % ointment Apply 4 drops topically daily.  . Multiple Vitamin (MULTIVITAMIN) tablet Take 1 tablet by mouth daily.  . NON FORMULARY Diet order: Downgrade patient to dysphagia 2 diet, continue with thin liquids; NO straws, add extra gravy.  . Nutritional Supplements (FEEDING SUPPLEMENT, NEPRO CARB STEADY,) LIQD Take 237 mLs by mouth 3 (three) times daily between meals.  . senna-docusate (SENOKOT-S) 8.6-50 MG tablet Take 1 tablet by mouth at bedtime as needed for mild constipation.  . timolol (TIMOPTIC) 0.5 % ophthalmic solution Place 1 drop into both eyes daily.   . TURMERIC PO Take 1,300 mg by mouth.  . [DISCONTINUED] aspirin EC 81 MG tablet Take 81 mg by mouth daily.  . [DISCONTINUED] donepezil (ARICEPT) 5  MG tablet Take 5 mg by mouth at bedtime.   . [DISCONTINUED] mometasone (ELOCON) 0.1 % lotion APPLY 4 DROPS INTO EACH EAR DAILY FOR 10 DAYS THEN AS NEEDED FOR DRY SKIN AND ITCHING   No facility-administered encounter medications on file as of 10/23/2017.     Review of Systems  GENERAL: No change in appetite, no fatigue, no weight changes, no fever, chills or weakness MOUTH and THROAT: Denies oral discomfort, gingival pain or bleeding RESPIRATORY: no cough, SOB, DOE, wheezing, hemoptysis CARDIAC: No chest pain, edema or palpitations GI: No abdominal pain, diarrhea, constipation, heart burn, nausea or vomiting GU: Denies dysuria, frequency, hematuria, incontinence, or discharge PSYCHIATRIC: +insomnia   Immunization History  Administered Date(s) Administered  . Influenza, High Dose Seasonal PF 11/26/2015, 12/11/2016  . Influenza,inj,Quad PF,6+ Mos 12/03/2014  . Influenza-Unspecified 11/12/2013  . PPD Test 10/05/2017  . Pneumococcal Conjugate-13 09/03/2013  . Pneumococcal  Polysaccharide-23 10/15/1995, 11/13/2000  . Td 08/08/2004, 10/06/2015  . Zoster 02/26/2006   Pertinent  Health Maintenance Due  Topic Date Due  . URINE MICROALBUMIN  10/05/2016  . INFLUENZA VACCINE  09/13/2017  . FOOT EXAM  02/26/2018  . HEMOGLOBIN A1C  03/07/2018  . OPHTHALMOLOGY EXAM  04/25/2018  . PNA vac Low Risk Adult  Completed   Fall Risk  06/05/2017 06/05/2017 02/26/2017 12/11/2016 08/23/2016  Falls in the past year? No No Yes No No  Number falls in past yr: - - 1 - -  Injury with Fall? - - Yes - -  Risk Factor Category  - - High Fall Risk - -  Follow up - - Falls evaluation completed - -       Vitals:   10/23/17 1215  BP: (!) 151/55  Pulse: 65  Resp: 18  Temp: 98 F (36.7 C)  TempSrc: Oral  SpO2: 100%  Weight: 155 lb 11.2 oz (70.6 kg)  Height: 5\' 7"  (1.702 m)   Body mass index is 24.39 kg/m.  Physical Exam  GENERAL APPEARANCE: Well nourished. In no acute distress. Normal body habitus SKIN:  Skin is warm and dry.  MOUTH and THROAT: Lips are without lesions. Oral mucosa is moist and without lesions. Tongue is normal in shape, size, and color and without lesions RESPIRATORY: Breathing is even & unlabored, BS CTAB CARDIAC: RRR, + murmur,no extra heart sounds, no edema, right chest permcath GI: Abdomen soft, normal BS, no masses, no tenderness EXTREMITIES:  Able to move X 4 extremities PSYCHIATRIC: Alert and oriented X 3. Affect and behavior are appropriate   Labs reviewed: Recent Labs    09/30/17 0058  10/01/17 0434 10/02/17 0427  10/06/17 1246  10/10/17 3329 10/11/17 1041  10/14/17 0631 10/16/17 0640 10/18/17 0624  NA  --    < > 129* 128*   < > 132*   < > 135 135   < > 133* 136 134*  K  --    < > 4.3 5.3*   < > 4.3   < > 4.2 4.1   < > 4.3 5.5* 5.4*  CL  --    < > 99 98   < > 93*   < > 90* 84*   < > 87* 98 98  CO2  --    < > 20* 20*   < > 27   < > 31 36*   < > 33* 29 28  GLUCOSE  --    < > 171* 307*   < > 323*   < > 144*  212*   < > 163* 139* 177*    BUN  --    < > 50* 57*   < > 57*   < > 69* 79*   < > 95* 85* 88*  CREATININE  --    < > 2.91* 3.73*   < > 5.56*   < > 3.33* 3.72*   < > 3.70* 3.48* 3.09*  CALCIUM  --    < > 7.8* 8.0*   < > 8.3*   < > 9.7 9.8   < > 9.7 9.7 9.4  MG 1.5*  --  2.2 2.4  --   --   --   --   --   --   --   --   --   PHOS 2.5  --  2.7 4.3   < > 3.3  3.3  --  3.5 3.6  --   --   --   --    < > = values in this interval not displayed.   Recent Labs    02/26/17 0925 09/04/17 1004  09/29/17 2201 10/06/17 1246 10/10/17 0608 10/11/17 1041  AST 23 34  --  34  --   --   --   ALT 18 20  --  26  --   --   --   ALKPHOS 77  --   --  83  --   --   --   BILITOT 0.4  --   --  0.6  --   --   --   PROT 8.3  --   --  7.9  --   --   --   ALBUMIN 3.9  --    < > 3.2* 2.5* 2.6* 2.5*   < > = values in this interval not displayed.   Recent Labs    02/26/17 0925  10/09/17 0547 10/10/17 0608 10/14/17 0631  WBC 8.0   < > 14.8* 13.3* 12.0*  NEUTROABS 3.8  --  10.4* 8.7*  --   HGB 9.8*   < > 8.5* 8.8* 8.4*  HCT 31.2*   < > 24.9* 26.7* 25.4*  MCV 96   < > 91.1 91.5 91.0  PLT 333   < > 295 365 376   < > = values in this interval not displayed.   Lab Results  Component Value Date   TSH 3.040 02/26/2017   Lab Results  Component Value Date   HGBA1C 8.1 (H) 05/03/2016   Lab Results  Component Value Date   CHOL 106 09/04/2017   HDL 44 02/26/2017   LDLCALC 54 02/26/2017   TRIG 64 09/04/2017   CHOLHDL 2.6 02/26/2017    Significant Diagnostic Results in last 30 days:  Ct Abdomen Pelvis Wo Contrast  Result Date: 09/30/2017 CLINICAL DATA:  Patient with nausea, vomiting and diarrhea. End-stage renal disease. EXAM: CT ABDOMEN AND PELVIS WITHOUT CONTRAST TECHNIQUE: Multidetector CT imaging of the abdomen and pelvis was performed following the standard protocol without IV contrast. COMPARISON:  CT abdomen pelvis 03/16/2016. FINDINGS: Lower chest: Normal heart size. Interval development of bilateral ground-glass opacities and  interlobular septal thickening. Small bilateral pleural effusions. Hepatobiliary: Liver is normal in size and contour. Multiple stones within the gallbladder lumen. No gallbladder wall thickening. No intrahepatic or extrahepatic biliary ductal dilatation. Pancreas: Unremarkable Spleen: Unremarkable Adrenals/Urinary Tract: Adrenal glands are normal. Bilateral perinephric fat stranding. Kidneys are symmetric in size. No hydronephrosis. Urinary bladder is unremarkable. Stomach/Bowel: Mild wall thickening of the rectum.  Re demonstrated mild wall thickening of the intrathoracic stomach. No evidence for bowel obstruction. No free intraperitoneal air. Large hiatal hernia. Vascular/Lymphatic: Normal caliber abdominal aorta. Peripheral calcified atherosclerotic plaque. Interval increase in size of retroperitoneal adenopathy with a reference 1.6 cm aortocaval lymph node (image 24; series 2), previously measuring 1.2 cm. Left periaortic lymph node measures 1.8 cm (image 26; series 2), previously 1.3 cm. Left periaortic lymph node measures 1.8 cm (image 36; series 2), previously 0.9 cm. Reference paraceliac node measures 1.4 cm (image 20; series 2), previously 0.9 cm. Reproductive: Prostate is enlarged. Other: Small bilateral fat containing inguinal hernias, left-greater-than-right. Musculoskeletal: Lumbar spine degenerative changes. No aggressive or acute appearing osseous lesions. IMPRESSION: 1. Interval increase in size of bulky retroperitoneal and upper abdominal adenopathy concerning for the possibility of lymphoma or metastatic disease. 2. Re demonstrated nonspecific mild wall thickening of the intrathoracic stomach. Large hiatal hernia. 3. Interval development of bilateral lower lobe ground-glass opacities concerning for the possibility of edema. Small bilateral pleural effusions. 4. Mild wall thickening of the rectum.  Colitis not excluded. Electronically Signed   By: Lovey Newcomer M.D.   On: 09/30/2017 00:21   Dg Chest  Port 1 View  Result Date: 10/10/2017 CLINICAL DATA:  Congestive failure EXAM: PORTABLE CHEST 1 VIEW COMPARISON:  10/09/2017 FINDINGS: Cardiac shadow is stable. Postsurgical changes are noted. Dialysis catheter is again seen and stable. Mild vascular congestion is noted although somewhat improved when compared with the prior exam. Persistent effusions are noted left greater than right. Hiatal hernia is again seen. IMPRESSION: Slight improvement in vascular congestion. Small effusions are again noted. Electronically Signed   By: Inez Catalina M.D.   On: 10/10/2017 10:07   Dg Chest Port 1 View  Result Date: 10/09/2017 CLINICAL DATA:  Pneumonia EXAM: PORTABLE CHEST 1 VIEW COMPARISON:  10/07/2017 FINDINGS: Dual lumen dialysis access catheter unchanged in position in the right atrium. Vascular congestion with bilateral airspace disease shows mild interval improvement. Small bilateral effusions and bibasilar atelectasis remain. IMPRESSION: Improvement in pulmonary edema. Improvement in bibasilar atelectasis. Small bilateral effusions. Electronically Signed   By: Franchot Gallo M.D.   On: 10/09/2017 07:55   Dg Chest Port 1 View  Result Date: 10/07/2017 CLINICAL DATA:  Cough, difficulty breathing EXAM: PORTABLE CHEST 1 VIEW COMPARISON:  10/01/2017 FINDINGS: Right dialysis catheter is in place with the tip in the right atrium. Prior CABG. Cardiomegaly. Diffuse interstitial opacities throughout the lungs compatible with edema. Small bilateral effusions. Left lower lobe atelectasis. Large hiatal hernia. IMPRESSION: Cardiomegaly with diffuse interstitial prominence throughout the lungs compatible with interstitial edema/CHF. Large hiatal hernia. Small bilateral effusions. Electronically Signed   By: Rolm Baptise M.D.   On: 10/07/2017 23:02   Dg Chest Port 1 View  Result Date: 10/01/2017 CLINICAL DATA:  Respiratory failure.  Myocardial infarction. EXAM: PORTABLE CHEST 1 VIEW COMPARISON:  09/30/2017 FINDINGS: Stable  cardiac enlargement. Stable to slightly improved interstitial edema. No significant pleural effusions identified. No pneumothorax. IMPRESSION: Stable to slightly improved pulmonary interstitial edema. Electronically Signed   By: Aletta Edouard M.D.   On: 10/01/2017 08:28   Dg Chest Port 1 View  Result Date: 09/30/2017 CLINICAL DATA:  Respiratory failure. Shortness of breath and chest pain EXAM: PORTABLE CHEST 1 VIEW COMPARISON:  09/30/2017 FINDINGS: Cardiomegaly and CABG changes again noted. Bilateral interstitial and airspace opacities have slightly improved. There is no evidence of pneumothorax. Hiatal hernia and probable trace effusions noted. No other significant change. IMPRESSION: Slightly improved bilateral interstitial/airspace opacities without  other significant change. Electronically Signed   By: Margarette Canada M.D.   On: 09/30/2017 11:27   Dg Chest Port 1 View  Result Date: 09/30/2017 CLINICAL DATA:  Stage IV renal disease EXAM: PORTABLE CHEST 1 VIEW COMPARISON:  Chest radiograph 04/10/2009 FINDINGS: Monitoring leads overlie the patient. Stable cardiac and mediastinal contours. Large hiatal hernia. Diffuse bilateral interstitial pulmonary opacities. Small bilateral pleural effusions. IMPRESSION: Findings compatible with edema and small bilateral pleural effusions. Large hiatal hernia. Electronically Signed   By: Lovey Newcomer M.D.   On: 09/30/2017 00:46    Assessment/Plan  1. Non-STEMI (non-ST elevated myocardial infarction) ([place]) - S/P cardiac catheterization with drug-eluting stent to the previous bypasse graft, continue ASA 81 mg daily, Atorvastatin 80 mg 1 tab Q HS, Ezetimibe 10 mg 1 tab daily, Plavix 75 mg 1 tab daily, Isosorbide MN ER   2. Chronic systolic CHF (congestive heart failure) (HCC) - was given IV Lasix, Torsemide was held due to worsening renal function, continue Lasix 40 mg 1 tab daily, Metoprolol tartrate 50 mg 1 tab Q 12 hours,    3. Acute renal failure superimposed on  stage 4 chronic kidney disease, unspecified acute renal failure type ([place]) - Torsemide was held due to worsening renal function, had permcath placement and hemodialysis X 1 in the hospital, will re-check bmp, follow-up with nephrology   4. 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 disease ([place]) - continue Novolog sliding scale TIDac and Tradjenta 5 mg daily Lab Results  Component Value Date   HGBA1C 8.1 (H) 05/03/2016     5. Hyperlipidemia, unspecified hyperlipidemia type -  continue Atorvastatin 80 mg 1 tab Q HS and Ezetimibe 10 mg 1 tab daily Lab Results  Component Value Date   CHOL 106 09/04/2017   HDL 44 02/26/2017   LDLCALC 54 02/26/2017   TRIG 64 09/04/2017   CHOLHDL 2.6 02/26/2017     6. Early onset Alzheimer's dementia without behavioral disturbance - continue Donepezil 5 mg 1 tab Q HS, supportive care, fall precautions   7. Anemia of chronic disease - stable Lab Results  Component Value Date   HGB 8.4 (L) 10/14/2017    8. Insomnia - will start Melatonin 3 mg 1 tab Q HS     Family/ staff Communication: Discussed plan of care with patient.  Labs/tests ordered:  BMP  Goals of care:   Short term [place], NP [facility] [phone] (Monday-Friday 8:00 a.m. - 5:00 p.m.) [phone] (after hours)

## 2017-10-25 DIAGNOSIS — N179 Acute kidney failure, unspecified: Secondary | ICD-10-CM | POA: Diagnosis not present

## 2017-10-25 DIAGNOSIS — N184 Chronic kidney disease, stage 4 (severe): Secondary | ICD-10-CM | POA: Diagnosis not present

## 2017-10-25 DIAGNOSIS — E875 Hyperkalemia: Secondary | ICD-10-CM | POA: Diagnosis not present

## 2017-10-25 DIAGNOSIS — E1129 Type 2 diabetes mellitus with other diabetic kidney complication: Secondary | ICD-10-CM | POA: Diagnosis not present

## 2017-10-26 ENCOUNTER — Encounter (INDEPENDENT_AMBULATORY_CARE_PROVIDER_SITE_OTHER): Payer: Self-pay

## 2017-10-31 ENCOUNTER — Encounter: Payer: Self-pay | Admitting: Adult Health

## 2017-10-31 ENCOUNTER — Non-Acute Institutional Stay (SKILLED_NURSING_FACILITY): Payer: Medicare Other | Admitting: Adult Health

## 2017-10-31 DIAGNOSIS — N184 Chronic kidney disease, stage 4 (severe): Secondary | ICD-10-CM | POA: Diagnosis not present

## 2017-10-31 DIAGNOSIS — I272 Pulmonary hypertension, unspecified: Secondary | ICD-10-CM | POA: Diagnosis not present

## 2017-10-31 DIAGNOSIS — I214 Non-ST elevation (NSTEMI) myocardial infarction: Secondary | ICD-10-CM | POA: Diagnosis not present

## 2017-10-31 DIAGNOSIS — E1122 Type 2 diabetes mellitus with diabetic chronic kidney disease: Secondary | ICD-10-CM | POA: Diagnosis not present

## 2017-10-31 NOTE — Progress Notes (Signed)
Location:   [facility] Room Number: 209A Place of Service:  SNF (31)    CODE STATUS: FULL  Allergies  Allergen Reactions  . Bee Venom Hives  . Iodinated Diagnostic Agents Rash  . Metrizamide Rash  . Other Rash and Other (See Comments)    Dye    Chief Complaint  Patient presents with  . Discharge Note    Discharging from SNF on 11/01/2017    HPI:  He is being discharged to home with home health for pt/ot/rn. He will need a front wheel walker with basket and bsc. He will need his prescriptions written and will need to follow up with his medical provider. He had been hospitalized for non-stemi MI; acute pulmonary edema. He was admitted to this facility for short term rehab and is now ready for discharge to home.    Past Medical History:  Diagnosis Date  . Anemia   . Chronic kidney disease    STAGE 4  . Coronary atherosclerosis    CABG X 4  . Diabetes mellitus without complication ([place])   . Edema    ankle  . GERD (gastroesophageal reflux disease)   . Glaucoma   . Hearing loss   . Heart murmur   . History of hiatal hernia   . Hyperlipidemia   . Hypertension   . Mini stroke ([place])   . Mitral insufficiency   . Myocardial infarction ([place])    mild, heart cath done no stents needed  . Stroke [employer]) 2011   TIA    Past Surgical History:  Procedure Laterality Date  . CATARACT EXTRACTION W/PHACO Left 09/05/2016   Procedure: CATARACT EXTRACTION PHACO AND INTRAOCULAR LENS PLACEMENT (IOC);  Surgeon: Birder Robson, MD;  Location: [facility] ORS;  Service: Ophthalmology;  Laterality: Left;  [place] 00:43.1 AP% 22.6 CDE 9.77 Fluid Pack lot #[id number]  . CATARACT EXTRACTION W/PHACO Right 09/26/2016   Procedure: CATARACT EXTRACTION PHACO AND INTRAOCULAR LENS PLACEMENT ([place]);  Surgeon: Birder Robson, MD;  Location: [facility] ORS;  Service: Ophthalmology;  Laterality: Right;  [place] 01:00 AP% 16.0 CDE 9.71 Fluid pack lot # [id number]  . CORONARY ARTERY BYPASS GRAFT   2003  . DIALYSIS/PERMA CATHETER INSERTION N/A 10/04/2017   Procedure: DIALYSIS/PERMA CATHETER INSERTION;  Surgeon: Algernon Huxley, MD;  Location: [place] [facility];  Service: Cardiovascular;  Laterality: N/A;  . ESOPHAGOGASTRODUODENOSCOPY (EGD) WITH PROPOFOL N/A 04/25/2016   Procedure: ESOPHAGOGASTRODUODENOSCOPY (EGD) WITH PROPOFOL;  Surgeon: Lucilla Lame, MD;  Location: [facility] ENDOSCOPY;  Service: Endoscopy;  Laterality: N/A;  . JOINT REPLACEMENT Right 2010   knee  . LEFT HEART CATH AND CORONARY ANGIOGRAPHY N/A 10/04/2017   Procedure: LEFT HEART CATH AND CORONARY ANGIOGRAPHY;  Surgeon: Isaias Cowman, MD;  Location: [place] [facility];  Service: Cardiovascular;  Laterality: N/A;  . SKIN CANCER EXCISION      Social History   Socioeconomic History  . Marital status: Married    Spouse name: Not on file  . Number of children: Not on file  . Years of education: Not on file  . Highest education level: Not on file  Occupational History  . Not on file  Social Needs  . Financial resource strain: Not on file  . Food insecurity:    Worry: Not on file    Inability: Not on file  . Transportation needs:    Medical: Not on file    Non-medical: Not on file  Tobacco Use  . Smoking status: Former Smoker  Last attempt to quit: 09/02/1964    Years since quitting: 53.1  . Smokeless tobacco: Never Used  Substance and Sexual Activity  . Alcohol use: No    Alcohol/week: 0.0 standard drinks  . Drug use: No  . Sexual activity: Not on file  Lifestyle  . Physical activity:    Days per week: Not on file    Minutes per session: Not on file  . Stress: Not on file  Relationships  . Social connections:    Talks on phone: Not on file    Gets together: Not on file    Attends religious service: Not on file    Active member of club or organization: Not on file    Attends meetings of clubs or organizations: Not on file    Relationship status: Not on file  . Intimate partner violence:    Fear of  current or ex partner: Not on file    Emotionally abused: Not on file    Physically abused: Not on file    Forced sexual activity: Not on file  Other Topics Concern  . Not on file  Social History Narrative  . Not on file   Family History  Problem Relation Age of Onset  . Stroke Mother   . Diabetes Mother   . Diabetes Sister   . Diabetes Maternal Grandmother     VITAL SIGNS BP 140/67   Pulse 72   Temp 98.1 F (36.7 C) (Oral)   Resp 18   Ht 5\' 7"  (1.702 m)   Wt 153 lb 9.6 oz (69.7 kg)   SpO2 96%   BMI 24.06 kg/m   Patient's Medications  New Prescriptions   No medications on file  Previous Medications   ACETAMINOPHEN (TYLENOL) 325 MG TABLET    Take 650 mg by mouth every 4 (four) hours as needed. for pain/ increased temp. May be administered orally, per G-tube if needed or rectally if unable to swallow (separate order). Maximum dose for 24 hours is 3,000 mg from all sources of Acetaminophen/ Tylenol   ALUM & MAG HYDROXIDE-SIMETH (GI COCKTAIL) SUSP SUSPENSION    Take 30 mLs by mouth 3 (three) times daily as needed for indigestion. Shake well.   ASPIRIN EC 81 MG TABLET    Take 81 mg by mouth daily.   ATORVASTATIN (LIPITOR) 80 MG TABLET    Take 1 tablet (80 mg total) by mouth at bedtime.   BISACODYL (DULCOLAX) 5 MG EC TABLET    Take 1 tablet (5 mg total) by mouth daily as needed for moderate constipation.   CLOPIDOGREL (PLAVIX) 75 MG TABLET    Take 1 tablet (75 mg total) by mouth daily with breakfast.   DONEPEZIL (ARICEPT) 5 MG TABLET    Take 5 mg by mouth at bedtime.   EZETIMIBE (ZETIA) 10 MG TABLET    Take 1 tablet (10 mg total) by mouth daily.   FUROSEMIDE (LASIX) 40 MG TABLET    Take 40 mg by mouth daily as needed for fluid.    INSULIN ASPART (NOVOLOG) 100 UNIT/ML INJECTION    Inject 0-15 Units into the skin 3 (three) times daily before meals. If Blood Sugar is 70 to 120, give 0 Units. If Blood Sugar is 121 to 150, give 2 Units. If Blood Sugar is 151 to 200, give 3 Units. If  Blood Sugar is 201 to 250, give 5 Units. If Blood Sugar is 251 to 300, give 8 Units. If Blood Sugar is 301 to 350,  give 11 Units. If Blood Sugar is 351 to 400, give 15 Units. If Blood Sugar is greater than 400, call MD. subcutaneous   ISOSORBIDE MONONITRATE (IMDUR) 30 MG 24 HR TABLET    Take 30 mg by mouth daily.    LINAGLIPTIN (TRADJENTA) 5 MG TABS TABLET    Take 1 tablet (5 mg total) by mouth daily.   MELATONIN 3 MG CAPS    Take 3 mg by mouth at bedtime.    METOPROLOL TARTRATE (LOPRESSOR) 50 MG TABLET    Take 1 tablet (50 mg total) by mouth every 12 (twelve) hours.   MULTIPLE VITAMIN (MULTIVITAMIN) TABLET    Take 1 tablet by mouth daily.   NON FORMULARY    Diet order: Downgrade patient to dysphagia 2 diet, continue with thin liquids; NO straws, add extra gravy.   NUTRITIONAL SUPPLEMENTS (FEEDING SUPPLEMENT, NEPRO CARB STEADY,) LIQD    Take 237 mLs by mouth 3 (three) times daily between meals.   ONDANSETRON (ZOFRAN) 4 MG TABLET    Take 4 mg by mouth every 6 (six) hours as needed for nausea or vomiting.   SENNA-DOCUSATE (SENOKOT-S) 8.6-50 MG TABLET    Take 1 tablet by mouth at bedtime as needed for mild constipation.   TIMOLOL (TIMOPTIC) 0.5 % OPHTHALMIC SOLUTION    Place 1 drop into both eyes daily.    TURMERIC PO    Take 1,300 mg by mouth daily.   Modified Medications   No medications on file  Discontinued Medications   MOMETASONE (ELOCON) 0.1 % CREAM    Apply 1 application topically daily as needed. FOR DRY SKIN AND ITCHING     SIGNIFICANT DIAGNOSTIC EXAMS  LABS REVIEWED TODAY:   10-23-17: glucose 224; bun 76 creat 2.56; k+ 5.6; na++ 131; ca 9.1   Review of Systems  Constitutional: Negative for malaise/fatigue.  Respiratory: Negative for cough and shortness of breath.   Cardiovascular: Negative for chest pain, palpitations and leg swelling.  Gastrointestinal: Negative for abdominal pain, constipation and heartburn.  Musculoskeletal: Negative for back pain, joint pain and  myalgias.  Skin: Negative.   Neurological: Negative for dizziness.  Psychiatric/Behavioral: The patient is not nervous/anxious.    Physical Exam  Constitutional: He is oriented to person, place, and time. He appears well-developed and well-nourished. No distress.  Neck: No thyromegaly present.  Cardiovascular: Normal rate, regular rhythm, normal heart sounds and intact distal pulses.  Pulmonary/Chest: Effort normal and breath sounds normal. No respiratory distress.  Abdominal: Soft. Bowel sounds are normal. He exhibits no distension. There is no tenderness.  Musculoskeletal: Normal range of motion. He exhibits no edema.  Lymphadenopathy:    He has no cervical adenopathy.  Neurological: He is alert and oriented to person, place, and time.  Skin: Skin is warm and dry. He is not diaphoretic.  Psychiatric: He has a normal mood and affect.      ASSESSMENT/ PLAN:   Patient is being discharged with the following home health services:  Pt/ot/rn: to evaluate and treat as indicated for gait balance strength adl training and medication management.   Patient is being discharged with the following durable medical equipment:  front wheel walker with basket in order to allow him to maintain his current level of independence with his adls. Will need BSC.   Patient has been advised to f/u with their PCP in 1-2 weeks to bring them up to date on their rehab stay.  Social services at facility was responsible for arranging this appointment.  Pt was  provided with a 30 day supply of prescriptions for medications and refills must be obtained from their PCP.  For controlled substances, a more limited supply may be provided adequate until PCP appointment only.  A 30 day supply of prescription medications have been written as listed above.    Time spent with patient 40 minutes: discussed medications; home health needs and expectations; and dme. Verbalized understanding.     Ok Edwards NP [facility] Adult  Medicine  Contact [phone] Monday through Friday 8am- 5pm  After hours call [phone]

## 2017-11-01 ENCOUNTER — Encounter
Admission: RE | Disposition: A | Discharge status: Home / Self Care | Payer: Self-pay | Source: Ambulatory Visit | Attending: Vascular Surgery

## 2017-11-01 ENCOUNTER — Ambulatory Visit
Admission: RE | Admit: 2017-11-01 | Discharge: 2017-11-01 | Disposition: A | Discharge status: Home / Self Care | Payer: Medicare Other | Source: Ambulatory Visit | Attending: Vascular Surgery | Admitting: Vascular Surgery

## 2017-11-01 ENCOUNTER — Other Ambulatory Visit (INDEPENDENT_AMBULATORY_CARE_PROVIDER_SITE_OTHER): Payer: Self-pay | Admitting: Vascular Surgery

## 2017-11-01 DIAGNOSIS — I252 Old myocardial infarction: Secondary | ICD-10-CM | POA: Insufficient documentation

## 2017-11-01 DIAGNOSIS — Z8673 Personal history of transient ischemic attack (TIA), and cerebral infarction without residual deficits: Secondary | ICD-10-CM | POA: Insufficient documentation

## 2017-11-01 DIAGNOSIS — H409 Unspecified glaucoma: Secondary | ICD-10-CM | POA: Insufficient documentation

## 2017-11-01 DIAGNOSIS — N184 Chronic kidney disease, stage 4 (severe): Secondary | ICD-10-CM | POA: Diagnosis not present

## 2017-11-01 DIAGNOSIS — Z4901 Encounter for fitting and adjustment of extracorporeal dialysis catheter: Secondary | ICD-10-CM | POA: Diagnosis not present

## 2017-11-01 DIAGNOSIS — I2510 Atherosclerotic heart disease of native coronary artery without angina pectoris: Secondary | ICD-10-CM | POA: Diagnosis not present

## 2017-11-01 DIAGNOSIS — I120 Hypertensive chronic kidney disease with stage 5 chronic kidney disease or end stage renal disease: Secondary | ICD-10-CM | POA: Insufficient documentation

## 2017-11-01 DIAGNOSIS — Z87891 Personal history of nicotine dependence: Secondary | ICD-10-CM | POA: Insufficient documentation

## 2017-11-01 DIAGNOSIS — E1122 Type 2 diabetes mellitus with diabetic chronic kidney disease: Secondary | ICD-10-CM | POA: Insufficient documentation

## 2017-11-01 DIAGNOSIS — N186 End stage renal disease: Secondary | ICD-10-CM | POA: Insufficient documentation

## 2017-11-01 DIAGNOSIS — E785 Hyperlipidemia, unspecified: Secondary | ICD-10-CM | POA: Insufficient documentation

## 2017-11-01 DIAGNOSIS — I10 Essential (primary) hypertension: Secondary | ICD-10-CM | POA: Diagnosis not present

## 2017-11-01 DIAGNOSIS — Z951 Presence of aortocoronary bypass graft: Secondary | ICD-10-CM | POA: Insufficient documentation

## 2017-11-01 DIAGNOSIS — K219 Gastro-esophageal reflux disease without esophagitis: Secondary | ICD-10-CM | POA: Insufficient documentation

## 2017-11-01 HISTORY — PX: DIALYSIS/PERMA CATHETER REMOVAL: CATH118289

## 2017-11-01 SURGERY — DIALYSIS/PERMA CATHETER REMOVAL
Anesthesia: LOCAL

## 2017-11-01 MED ORDER — LIDOCAINE-EPINEPHRINE (PF) 1 %-1:200000 IJ SOLN
INTRAMUSCULAR | Status: DC | PRN
  Administered 2017-11-01: 20 mL via INTRADERMAL

## 2017-11-01 SURGICAL SUPPLY — 2 items
FORCEPS HALSTEAD CVD 5IN STRL (INSTRUMENTS) ×2 IMPLANT
TRAY LACERAT/PLASTIC (MISCELLANEOUS) ×2 IMPLANT

## 2017-11-01 NOTE — Discharge Instructions (Signed)
Incision Care, Adult °An incision is a cut that a doctor makes in your skin for surgery (for a procedure). Most times, these cuts are closed after surgery. Your cut from surgery may be closed with stitches (sutures), staples, skin glue, or skin tape (adhesive strips). You may need to return to your doctor to have stitches or staples taken out. This may happen many days or many weeks after your surgery. The cut needs to be well cared for so it does not get infected. °How to care for your cut °Cut care °· Follow instructions from your doctor about how to take care of your cut. Make sure you: °? Wash your hands with soap and water before you change your bandage (dressing). If you cannot use soap and water, use hand sanitizer. °? Change your bandage as told by your doctor. °? Leave stitches, skin glue, or skin tape in place. They may need to stay in place for 2 weeks or longer. If tape strips get loose and curl up, you may trim the loose edges. Do not remove tape strips completely unless your doctor says it is okay. °· Check your cut area every day for signs of infection. Check for: °? More redness, swelling, or pain. °? More fluid or blood. °? Warmth. °? Pus or a bad smell. °· Ask your doctor how to clean the cut. This may include: °? Using mild soap and water. °? Using a clean towel to pat the cut dry after you clean it. °? Putting a cream or ointment on the cut. Do this only as told by your doctor. °? Covering the cut with a clean bandage. °· Ask your doctor when you can leave the cut uncovered. °· Do not take baths, swim, or use a hot tub until your doctor says it is okay. Ask your doctor if you can take showers. You may only be allowed to take sponge baths for bathing. °Medicines °· If you were prescribed an antibiotic medicine, cream, or ointment, take the antibiotic or put it on the cut as told by your doctor. Do not stop taking or putting on the antibiotic even if your condition gets better. °· Take  over-the-counter and prescription medicines only as told by your doctor. °General instructions °· Limit movement around your cut. This helps healing. °? Avoid straining, lifting, or exercise for the first month, or for as long as told by your doctor. °? Follow instructions from your doctor about going back to your normal activities. °? Ask your doctor what activities are safe. °· Protect your cut from the sun when you are outside for the first 6 months, or for as long as told by your doctor. Put on sunscreen around the scar or cover up the scar. °· Keep all follow-up visits as told by your doctor. This is important. °Contact a doctor if: °· Your have more redness, swelling, or pain around the cut. °· You have more fluid or blood coming from the cut. °· Your cut feels warm to the touch. °· You have pus or a bad smell coming from the cut. °· You have a fever or shaking chills. °· You feel sick to your stomach (nauseous) or you throw up (vomit). °· You are dizzy. °· Your stitches or staples come undone. °Get help right away if: °· You have a red streak coming from your cut. °· Your cut bleeds through the bandage and the bleeding does not stop with gentle pressure. °· The edges of your cut open   up and separate. °· You have very bad (severe) pain. °· You have a rash. °· You are confused. °· You pass out (faint). °· You have trouble breathing and you have a fast heartbeat. °This information is not intended to replace advice given to you by your health care provider. Make sure you discuss any questions you have with your health care provider. °Document Released: 04/24/2011 Document Revised: 10/08/2015 Document Reviewed: 10/08/2015 °Elsevier Interactive Patient Education © 2017 [employer] ° °

## 2017-11-01 NOTE — H&P (Signed)
[facility] Admission History & Physical  MRN : [id number]  Ray Lamb is a 84 y.o. ([birth year]) male who presents with chief complaint of No chief complaint on file. Marland Kitchen  History of Present Illness: I am asked to evaluate the patient by the dialysis center. The patient was sent here because they have a nonfunctioning tunneled catheter and are no longer requiring dialysis.  He has stage IV chronic kidney disease.  He had acute renal failure with an MI and a coronary intervention.  He has not gotten dialysis in the past 2 weeks but did get dialysis treatments initially.  Patient denies pain or tenderness overlying the access.  There is no pain with dialysis.  The patient denies hand pain or finger pain consistent with steal syndrome.  No fevers or chills while on dialysis.   No current facility-administered medications for this encounter.     Past Medical History:  Diagnosis Date  . Anemia   . Chronic kidney disease    STAGE 4  . Coronary atherosclerosis    CABG X 4  . Diabetes mellitus without complication ([place])   . Edema    ankle  . GERD (gastroesophageal reflux disease)   . Glaucoma   . Hearing loss   . Heart murmur   . History of hiatal hernia   . Hyperlipidemia   . Hypertension   . Mini stroke ([place])   . Mitral insufficiency   . Myocardial infarction ([place])    mild, heart cath done no stents needed  . Stroke [facility]) 2011   TIA    Past Surgical History:  Procedure Laterality Date  . CATARACT EXTRACTION W/PHACO Left 09/05/2016   Procedure: CATARACT EXTRACTION PHACO AND INTRAOCULAR LENS PLACEMENT (IOC);  Surgeon: Birder Robson, MD;  Location: [facility] ORS;  Service: Ophthalmology;  Laterality: Left;  [place] 00:43.1 AP% 22.6 CDE 9.77 Fluid Pack lot #[id number] H  . CATARACT EXTRACTION W/PHACO Right 09/26/2016   Procedure: CATARACT EXTRACTION PHACO AND INTRAOCULAR LENS PLACEMENT ([place]);  Surgeon: Birder Robson, MD;  Location: [facility] OR[facility];  Service:  Ophthalmology;  Laterality: Right;  [place] 01:00 AP% 16.0 CDE 9.71 Fluid pack lot # [id number] H  . CORONARY ARTERY BYPASS GRAFT  2003  . DIALYSIS/PERMA CATHETER INSERTION N/A 10/04/2017   Procedure: DIALYSIS/PERMA CATHETER INSERTION;  Surgeon: Algernon Huxley, MD;  Location: [place] [facility];  Service: Cardiovascular;  Laterality: N/A;  . ESOPHAGOGASTRODUODENOSCOPY (EGD) WITH PROPOFOL N/A 04/25/2016   Procedure: ESOPHAGOGASTRODUODENOSCOPY (EGD) WITH PROPOFOL;  Surgeon: Lucilla Lame, MD;  Location: [facility];  Service: Endoscopy;  Laterality: N/A;  . JOINT REPLACEMENT Right 2010   knee  . LEFT HEART CATH AND CORONARY ANGIOGRAPHY N/A 10/04/2017   Procedure: LEFT HEART CATH AND CORONARY ANGIOGRAPHY;  Surgeon: Isaias Cowman, MD;  Location: [place] [facility];  Service: Cardiovascular;  Laterality: N/A;  . SKIN CANCER EXCISION      Social History Social History   Tobacco Use  . Smoking status: Former Smoker    Last attempt to quit: 09/02/1964    Years since quitting: 53.2  . Smokeless tobacco: Never Used  Substance Use Topics  . Alcohol use: No    Alcohol/week: 0.0 standard drinks  . Drug use: No    Family History Family History  Problem Relation Age of Onset  . Stroke Mother   . Diabetes Mother   . Diabetes Sister   . Diabetes Maternal Grandmother     No family history of bleeding or clotting disorders, autoimmune disease or  porphyria  Allergies  Allergen Reactions  . Bee Venom Hives  . Iodinated Diagnostic Agents Rash  . Metrizamide Rash  . Other Rash and Other (See Comments)    Dye     REVIEW OF SYSTEMS (Negative unless checked)  Constitutional: [] Weight loss  [] Fever  [] Chills Cardiac: [] Chest pain   [] Chest pressure   [x] Palpitations   [] Shortness of breath when laying flat   [] Shortness of breath at rest   [x] Shortness of breath with exertion. Vascular:  [] Pain in legs with walking   [] Pain in legs at rest   [] Pain in legs when laying flat   [] Claudication    [] Pain in feet when walking  [] Pain in feet at rest  [] Pain in feet when laying flat   [] History of DVT   [] Phlebitis   [] Swelling in legs   [] Varicose veins   [] Non-healing ulcers Pulmonary:   [] Uses home oxygen   [] Productive cough   [] Hemoptysis   [] Wheeze  [] COPD   [] Asthma Neurologic:  [] Dizziness  [] Blackouts   [] Seizures   [] History of stroke   [] History of TIA  [] Aphasia   [] Temporary blindness   [] Dysphagia   [] Weakness or numbness in arms   [] Weakness or numbness in legs Musculoskeletal:  [] Arthritis   [] Joint swelling   [] Joint pain   [] Low back pain Hematologic:  [x] Easy bruising  [] Easy bleeding   [] Hypercoagulable state   [x] Anemic  [] Hepatitis Gastrointestinal:  [] Blood in stool   [] Vomiting blood  [] Gastroesophageal reflux/heartburn   [] Difficulty swallowing. Genitourinary:  [x] Chronic kidney disease   [] Difficult urination  [] Frequent urination  [] Burning with urination   [] Blood in urine Skin:  [] Rashes   [] Ulcers   [] Wounds Psychological:  [] History of anxiety   []  History of major depression.  Physical Examination  Vitals:   11/01/17 1104  BP: (!) 173/62  Pulse: 66  Resp: 20  Temp: 97.8 F (36.6 C)  TempSrc: Oral  SpO2: 100%   There is no height or weight on file to calculate BMI. Gen: WD/WN, NAD. Appears younger than stated age. Head: [place]/AT, No temporalis wasting.  Ear/Nose/Throat: Hearing grossly intact, nares w/o erythema or drainage, oropharynx w/o Erythema/Exudate,  Eyes: Conjunctiva clear, sclera non-icteric Neck: Trachea midline.  No JVD.  Pulmonary:  Good air movement, respirations not labored, no use of accessory muscles.  Cardiac: RRR, normal S1, S2. Vascular: permcath in right chest Vessel Right Left  Radial Palpable Palpable   Musculoskeletal: M/S 5/5 throughout.  Extremities without ischemic changes.  No deformity or atrophy.  Neurologic: Sensation grossly intact in extremities.  Symmetrical.  Speech is fluent. Motor exam as listed  above. Psychiatric: Judgment intact, Mood & affect appropriate for pt's clinical situation. Dermatologic: No rashes or ulcers noted.  No cellulitis or open wounds.    CBC Lab Results  Component Value Date   WBC 12.0 (H) 10/14/2017   HGB 8.4 (L) 10/14/2017   HCT 25.4 (L) 10/14/2017   MCV 91.0 10/14/2017   PLT 376 10/14/2017    BMET    Component Value Date/Time   NA 131 (L) 10/23/2017 1439   NA 137 09/04/2017 1026   K 5.6 (H) 10/23/2017 1439   CL 97 (L) 10/23/2017 1439   CO2 26 10/23/2017 1439   GLUCOSE 224 (H) 10/23/2017 1439   BUN 76 (H) 10/23/2017 1439   BUN 42 (H) 09/04/2017 1026   CREATININE 2.56 (H) 10/23/2017 1439   CALCIUM 9.1 10/23/2017 1439   GFRNONAA 21 (L) 10/23/2017 1439   GFRAA 25 (  L) 10/23/2017 1439   Estimated Creatinine Clearance: 19.7 mL/min (A) (by C-G formula based on SCr of 2.56 mg/dL (H)).  COAG Lab Results  Component Value Date   INR 1.08 09/30/2017    Radiology Dg Chest Port 1 View  Result Date: 10/10/2017 CLINICAL DATA:  Congestive failure EXAM: PORTABLE CHEST 1 VIEW COMPARISON:  10/09/2017 FINDINGS: Cardiac shadow is stable. Postsurgical changes are noted. Dialysis catheter is again seen and stable. Mild vascular congestion is noted although somewhat improved when compared with the prior exam. Persistent effusions are noted left greater than right. Hiatal hernia is again seen. IMPRESSION: Slight improvement in vascular congestion. Small effusions are again noted. Electronically Signed   By: Inez Catalina M.D.   On: 10/10/2017 10:07   Dg Chest Port 1 View  Result Date: 10/09/2017 CLINICAL DATA:  Pneumonia EXAM: PORTABLE CHEST 1 VIEW COMPARISON:  10/07/2017 FINDINGS: Dual lumen dialysis access catheter unchanged in position in the right atrium. Vascular congestion with bilateral airspace disease shows mild interval improvement. Small bilateral effusions and bibasilar atelectasis remain. IMPRESSION: Improvement in pulmonary edema. Improvement in  bibasilar atelectasis. Small bilateral effusions. Electronically Signed   By: Franchot Gallo M.D.   On: 10/09/2017 07:55   Dg Chest Port 1 View  Result Date: 10/07/2017 CLINICAL DATA:  Cough, difficulty breathing EXAM: PORTABLE CHEST 1 VIEW COMPARISON:  10/01/2017 FINDINGS: Right dialysis catheter is in place with the tip in the right atrium. Prior CABG. Cardiomegaly. Diffuse interstitial opacities throughout the lungs compatible with edema. Small bilateral effusions. Left lower lobe atelectasis. Large hiatal hernia. IMPRESSION: Cardiomegaly with diffuse interstitial prominence throughout the lungs compatible with interstitial edema/CHF. Large hiatal hernia. Small bilateral effusions. Electronically Signed   By: Rolm Baptise M.D.   On: 10/07/2017 23:02    Assessment/Plan 1.  Nonfunctional PermCath: The patient no longer needs his PermCath and this should be removed as an infectious and thrombotic risk at this point. 2.  CKD stage 4:  Patient will continue to follow with Nephrology 3.  Hypertension:  Patient will continue medical management; nephrology is following no changes in oral medications. 4.  Diabetes mellitus:  Glucose will be monitored and oral medications been held this morning once the patient has undergone the patient's procedure po intake will be reinitiated and again Accu-Cheks will be used to assess the blood glucose level and treat as needed. The patient will be restarted on the patient's usual hypoglycemic regime 5.  Coronary artery disease:  EKG will be monitored. Nitrates will be used if needed. The patient's oral cardiac medications will be continued. Recent heart cath and contrast with MI may have precipitated ARF.      Leotis Pain, MD  11/01/2017 11:36 AM

## 2017-11-01 NOTE — Op Note (Signed)
Operative Note     Preoperative diagnosis:   1.  CKD stage IV with return of renal function no longer requiring dialysis  Postoperative diagnosis:  1.  Same as above  Procedure:  Removal of right jugular Permcath  Surgeon:  Leotis Pain, MD  Anesthesia:  Local  EBL:  Minimal  Indication for the Procedure:  The patient has had return of the renal function and no longer needs their permcath.  This can be removed.  Risks and benefits are discussed and informed consent is obtained.  Description of the Procedure:  The patient's right neck, chest and existing catheter were sterilely prepped and draped. The area around the catheter was anesthetized copiously with 1% lidocaine. The catheter was dissected out with curved hemostats until the cuff was freed from the surrounding fibrous sheath. The fiber sheath was transected, and the catheter was then removed in its entirety using gentle traction. Pressure was held and sterile dressings were placed. The patient tolerated the procedure well and was taken to the recovery room in stable condition.     Leotis Pain  11/01/2017, 11:56 AM This note was created with Dragon Medical transcription system. Any errors in dictation are purely unintentional.

## 2017-11-02 ENCOUNTER — Encounter: Payer: Self-pay | Admitting: Vascular Surgery

## 2017-11-02 ENCOUNTER — Telehealth: Payer: Self-pay | Admitting: Family Medicine

## 2017-11-02 NOTE — Telephone Encounter (Signed)
As long as asymptomatic OK to wait until this appt in a few days. If not, should see UC or ER if severe sxs in meantime. Looks like his BPs usually run high-normal so this is not a significant change which is reassuring.   Copied from [place] [id number]. Topic: Inquiry >> Nov 02, 2017 10:53 AM Ray Lamb wrote: Reason for CRM: pt called to speak w/ the pcp/nurse about some concerns he has; pt has been discharged from rehab from having a heart attack; pt may be trying to get in earlier; contact pt to advise >> Nov 02, 2017  1:32 PM Ray Lamb wrote: Ray Lamb and scheduled an appt for a rehad follow up. While speaking with the pt he stated that his BP was 165/72 with a pulse 80 at breakfast and 155/70 with a pulse of 71 at lunch and he would like a call back. He is just concerned that this is a high.

## 2017-11-02 NOTE — Telephone Encounter (Signed)
Patient notified

## 2017-11-05 DIAGNOSIS — I2510 Atherosclerotic heart disease of native coronary artery without angina pectoris: Secondary | ICD-10-CM | POA: Diagnosis not present

## 2017-11-05 DIAGNOSIS — I5023 Acute on chronic systolic (congestive) heart failure: Secondary | ICD-10-CM | POA: Diagnosis not present

## 2017-11-05 DIAGNOSIS — I10 Essential (primary) hypertension: Secondary | ICD-10-CM | POA: Diagnosis not present

## 2017-11-05 DIAGNOSIS — I340 Nonrheumatic mitral (valve) insufficiency: Secondary | ICD-10-CM | POA: Diagnosis not present

## 2017-11-05 DIAGNOSIS — I25810 Atherosclerosis of coronary artery bypass graft(s) without angina pectoris: Secondary | ICD-10-CM | POA: Diagnosis not present

## 2017-11-06 ENCOUNTER — Encounter: Payer: Self-pay | Admitting: Family Medicine

## 2017-11-06 ENCOUNTER — Ambulatory Visit (INDEPENDENT_AMBULATORY_CARE_PROVIDER_SITE_OTHER): Payer: Medicare Other | Admitting: Family Medicine

## 2017-11-06 VITALS — BP 145/73 | HR 61 | Temp 97.9°F | Ht 68.0 in | Wt 152.4 lb

## 2017-11-06 DIAGNOSIS — I214 Non-ST elevation (NSTEMI) myocardial infarction: Secondary | ICD-10-CM | POA: Diagnosis not present

## 2017-11-06 DIAGNOSIS — E1122 Type 2 diabetes mellitus with diabetic chronic kidney disease: Secondary | ICD-10-CM | POA: Diagnosis not present

## 2017-11-06 DIAGNOSIS — I10 Essential (primary) hypertension: Secondary | ICD-10-CM | POA: Diagnosis not present

## 2017-11-06 DIAGNOSIS — N186 End stage renal disease: Secondary | ICD-10-CM | POA: Diagnosis not present

## 2017-11-06 DIAGNOSIS — N184 Chronic kidney disease, stage 4 (severe): Secondary | ICD-10-CM | POA: Diagnosis not present

## 2017-11-06 NOTE — Progress Notes (Signed)
BP (!) 145/73 (BP Location: Left Arm, Patient Position: Sitting, Cuff Size: Normal)   Pulse 61   Temp 97.9 F (36.6 C) (Oral)   Ht 5\' 8"  (1.727 m)   Wt 152 lb 6.4 oz (69.1 kg)   SpO2 97%   BMI 23.17 kg/m    Subjective:    Patient ID: Ray Lamb, male    DOB: 09/07/1930, 84 y.o.   MRN: [id number]  HPI: Ray Lamb is a 84 y.o. male  Chief Complaint  Patient presents with  . Hospitalization Follow-up    ESRD   Recently discharged from rehabilitation after hospitalization for NSTEMI, ESRD. Feeling much better, just working on getting his energy up. No new complaints or concerns today.   Saw Cardiologist yesterday and hydralazine was added as home BPs running around 150s-160s/70s. Seeing them again in 2-3 weeks for recheck. BPs have been down about 10 points today. Tolerating well.   Followed closely by Nephrology. No longer requiring dialysis since d/c. Has appt for next month with them. Last BMP was 9/14 with GFR of 21. Drinking lots of fluids, urinating well.   Back on januvia and glipizide. Requesting A1C today as his medications have been changing so much the past month. Sugars have been running 160 fasting in the mornings, lower during the day.   Past Medical History:  Diagnosis Date  . Anemia   . Chronic kidney disease    STAGE 4  . Coronary atherosclerosis    CABG X 4  . Diabetes mellitus without complication ([place])   . Edema    ankle  . GERD (gastroesophageal reflux disease)   . Glaucoma   . Hearing loss   . Heart murmur   . History of hiatal hernia   . Hyperlipidemia   . Hypertension   . Mini stroke ([place])   . Mitral insufficiency   . Myocardial infarction ([place])    mild, heart cath done no stents needed  . Stroke [facility]) 2011   TIA   Social History   Socioeconomic History  . Marital status: Married    Spouse name: Not on file  . Number of children: Not on file  . Years of education: Not on file  . Highest education level: Not on file  Occupational  History  . Not on file  Social Needs  . Financial resource strain: Not on file  . Food insecurity:    Worry: Not on file    Inability: Not on file  . Transportation needs:    Medical: Not on file    Non-medical: Not on file  Tobacco Use  . Smoking status: Former Smoker    Last attempt to quit: 09/02/1964    Years since quitting: 53.2  . Smokeless tobacco: Never Used  Substance and Sexual Activity  . Alcohol use: No    Alcohol/week: 0.0 standard drinks  . Drug use: No  . Sexual activity: Not on file  Lifestyle  . Physical activity:    Days per week: Not on file    Minutes per session: Not on file  . Stress: Not on file  Relationships  . Social connections:    Talks on phone: Not on file    Gets together: Not on file    Attends religious service: Not on file    Active member of club or organization: Not on file    Attends meetings of clubs or organizations: Not on file    Relationship status: Not on file  .  Intimate partner violence:    Fear of current or ex partner: Not on file    Emotionally abused: Not on file    Physically abused: Not on file    Forced sexual activity: Not on file  Other Topics Concern  . Not on file  Social History Narrative  . Not on file   Relevant past medical, surgical, family and social history reviewed and updated as indicated. Interim medical history since our last visit reviewed. Allergies and medications reviewed and updated.  Review of Systems  Per HPI unless specifically indicated above     Objective:    BP (!) 145/73 (BP Location: Left Arm, Patient Position: Sitting, Cuff Size: Normal)   Pulse 61   Temp 97.9 F (36.6 C) (Oral)   Ht 5\' 8"  (1.727 m)   Wt 152 lb 6.4 oz (69.1 kg)   SpO2 97%   BMI 23.17 kg/m   Wt Readings from Last 3 Encounters:  11/06/17 152 lb 6.4 oz (69.1 kg)  10/31/17 153 lb 9.6 oz (69.7 kg)  10/23/17 155 lb 11.2 oz (70.6 kg)    Physical Exam  Constitutional: He appears well-developed and  well-nourished. No distress.  HENT:  Head: Atraumatic.  Eyes: Conjunctivae and EOM are normal.  Neck: Normal range of motion. Neck supple.  Cardiovascular: Normal rate and regular rhythm.  Pulmonary/Chest: Effort normal and breath sounds normal.  Musculoskeletal: Normal range of motion.  Walks with cane, steady gait and good strength  Neurological: He is alert.  Skin: Skin is warm and dry.  Psychiatric: He has a normal mood and affect. His behavior is normal.  Nursing note and vitals reviewed.   Results for orders placed or performed during the hospital encounter of 43/15/40  Basic metabolic panel  Result Value Ref Range   Sodium 131 (L) 135 - 145 mmol/L   Potassium 5.6 (H) 3.5 - 5.1 mmol/L   Chloride 97 (L) 98 - 111 mmol/L   CO2 26 22 - 32 mmol/L   Glucose, Bld 224 (H) 70 - 99 mg/dL   BUN 76 (H) 8 - 23 mg/dL   Creatinine, Ser 2.56 (H) 0.61 - 1.24 mg/dL   Calcium 9.1 8.9 - 10.3 mg/dL   GFR calc non Af Amer 21 (L) >60 mL/min   GFR calc Af Amer 25 (L) >60 mL/min   Anion gap 8 5 - 15      Assessment & Plan:   Problem List Items Addressed This Visit      Cardiovascular and Mediastinum   Hypertension    Improving with addition of hydralazine, continue to monitor. Has Cardiology follow up in the next few weeks for recheck      Relevant Medications   hydrALAZINE (APRESOLINE) 25 MG tablet   Non-STEMI (non-ST elevated myocardial infarction) [facility])    Following closely with Cardiology      Relevant Medications   hydrALAZINE (APRESOLINE) 25 MG tablet     Endocrine   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 disease ([place]) - Primary    Will recheck A1C per pt request today, though I explained that last A1C was 1 month ago so it would likely not show significant difference. Continue current regimen, will adjust as needed      Relevant Medications   glipiZIDE (GLUCOTROL) 5 MG tablet   sitaGLIPtin (JANUVIA) 25 MG tablet   Other Relevant Orders   HgB A1c    Other Visit  Diagnoses    ESRD (end stage renal disease) ([place])  Recheck BMP today, followed by Nephrology. No longer on dialysis.    Relevant Orders   Basic Metabolic Panel (BMET)       Follow up plan: Return for as scheduled.

## 2017-11-07 LAB — BASIC METABOLIC PANEL
BUN/Creatinine Ratio: 17 (ref 10–24)
BUN: 40 mg/dL — ABNORMAL HIGH (ref 8–27)
CO2: 16 mmol/L — ABNORMAL LOW (ref 20–29)
Calcium: 8.7 mg/dL (ref 8.6–10.2)
Chloride: 100 mmol/L (ref 96–106)
Creatinine, Ser: 2.29 mg/dL — ABNORMAL HIGH (ref 0.76–1.27)
GFR calc Af Amer: 29 mL/min/{1.73_m2} — ABNORMAL LOW (ref >59)
GFR calc non Af Amer: 25 mL/min/{1.73_m2} — ABNORMAL LOW (ref >59)
Glucose: 74 mg/dL (ref 65–99)
POTASSIUM: 4.4 mmol/L (ref 3.5–5.2)
SODIUM: 138 mmol/L (ref 134–144)

## 2017-11-07 LAB — HEMOGLOBIN A1C
ESTIMATED AVERAGE GLUCOSE: 148 mg/dL
Hgb A1c MFr Bld: 6.8 % — ABNORMAL HIGH (ref 4.8–5.6)

## 2017-11-07 NOTE — Assessment & Plan Note (Signed)
Improving with addition of hydralazine, continue to monitor. Has Cardiology follow up in the next few weeks for recheck

## 2017-11-07 NOTE — Assessment & Plan Note (Signed)
Will recheck A1C per pt request today, though I explained that last A1C was 1 month ago so it would likely not show significant difference. Continue current regimen, will adjust as needed

## 2017-11-07 NOTE — Patient Instructions (Signed)
Follow up as scheduled.  

## 2017-11-07 NOTE — Assessment & Plan Note (Signed)
Following closely with Cardiology

## 2017-11-08 DIAGNOSIS — E119 Type 2 diabetes mellitus without complications: Secondary | ICD-10-CM | POA: Diagnosis not present

## 2017-11-08 DIAGNOSIS — M79675 Pain in left toe(s): Secondary | ICD-10-CM | POA: Diagnosis not present

## 2017-11-08 DIAGNOSIS — M79674 Pain in right toe(s): Secondary | ICD-10-CM | POA: Diagnosis not present

## 2017-11-08 DIAGNOSIS — B351 Tinea unguium: Secondary | ICD-10-CM | POA: Diagnosis not present

## 2017-11-15 DIAGNOSIS — I340 Nonrheumatic mitral (valve) insufficiency: Secondary | ICD-10-CM | POA: Diagnosis not present

## 2017-11-15 DIAGNOSIS — I10 Essential (primary) hypertension: Secondary | ICD-10-CM | POA: Diagnosis not present

## 2017-11-15 DIAGNOSIS — I509 Heart failure, unspecified: Secondary | ICD-10-CM | POA: Diagnosis not present

## 2017-11-15 DIAGNOSIS — E785 Hyperlipidemia, unspecified: Secondary | ICD-10-CM | POA: Diagnosis not present

## 2017-11-15 DIAGNOSIS — I2510 Atherosclerotic heart disease of native coronary artery without angina pectoris: Secondary | ICD-10-CM | POA: Diagnosis not present

## 2017-11-15 DIAGNOSIS — I25810 Atherosclerosis of coronary artery bypass graft(s) without angina pectoris: Secondary | ICD-10-CM | POA: Diagnosis not present

## 2017-11-15 DIAGNOSIS — I5023 Acute on chronic systolic (congestive) heart failure: Secondary | ICD-10-CM | POA: Diagnosis not present

## 2017-11-15 DIAGNOSIS — R0602 Shortness of breath: Secondary | ICD-10-CM | POA: Diagnosis not present

## 2017-11-19 ENCOUNTER — Encounter: Payer: Medicare Other | Attending: Cardiology | Admitting: *Deleted

## 2017-11-19 ENCOUNTER — Encounter: Payer: Self-pay | Admitting: *Deleted

## 2017-11-19 VITALS — Ht 65.3 in | Wt 153.5 lb

## 2017-11-19 DIAGNOSIS — Z8673 Personal history of transient ischemic attack (TIA), and cerebral infarction without residual deficits: Secondary | ICD-10-CM | POA: Diagnosis not present

## 2017-11-19 DIAGNOSIS — I2510 Atherosclerotic heart disease of native coronary artery without angina pectoris: Secondary | ICD-10-CM | POA: Diagnosis not present

## 2017-11-19 DIAGNOSIS — E785 Hyperlipidemia, unspecified: Secondary | ICD-10-CM | POA: Diagnosis not present

## 2017-11-19 DIAGNOSIS — Z87891 Personal history of nicotine dependence: Secondary | ICD-10-CM | POA: Diagnosis not present

## 2017-11-19 DIAGNOSIS — I129 Hypertensive chronic kidney disease with stage 1 through stage 4 chronic kidney disease, or unspecified chronic kidney disease: Secondary | ICD-10-CM | POA: Insufficient documentation

## 2017-11-19 DIAGNOSIS — Z79899 Other long term (current) drug therapy: Secondary | ICD-10-CM | POA: Insufficient documentation

## 2017-11-19 DIAGNOSIS — K219 Gastro-esophageal reflux disease without esophagitis: Secondary | ICD-10-CM | POA: Diagnosis not present

## 2017-11-19 DIAGNOSIS — E1122 Type 2 diabetes mellitus with diabetic chronic kidney disease: Secondary | ICD-10-CM | POA: Diagnosis not present

## 2017-11-19 DIAGNOSIS — Z7902 Long term (current) use of antithrombotics/antiplatelets: Secondary | ICD-10-CM | POA: Diagnosis not present

## 2017-11-19 DIAGNOSIS — I214 Non-ST elevation (NSTEMI) myocardial infarction: Secondary | ICD-10-CM | POA: Insufficient documentation

## 2017-11-19 DIAGNOSIS — N184 Chronic kidney disease, stage 4 (severe): Secondary | ICD-10-CM | POA: Diagnosis not present

## 2017-11-19 DIAGNOSIS — Z7984 Long term (current) use of oral hypoglycemic drugs: Secondary | ICD-10-CM | POA: Insufficient documentation

## 2017-11-19 DIAGNOSIS — Z955 Presence of coronary angioplasty implant and graft: Secondary | ICD-10-CM | POA: Insufficient documentation

## 2017-11-19 DIAGNOSIS — I252 Old myocardial infarction: Secondary | ICD-10-CM | POA: Diagnosis not present

## 2017-11-19 NOTE — Progress Notes (Signed)
Daily Session Note  Patient Details  Name: Ray Lamb MRN: [id number] Date of Birth: 1930-09-24 Referring Provider:     Cardiac Rehab from 11/19/2017 in [facility]  Referring Provider  Neoma Laming MD      Encounter Date: 11/19/2017  Check In: Session Check In - 11/19/17 1249      Check-In   Supervising physician immediately available to respond to emergencies  See telemetry face sheet for immediately available ER MD    Location  [facility]-Cardiac & Pulmonary Rehab    Staff Present  Renita Papa, RN BSN;Jessica Luan Pulling, MA, RCEP, CCRP, Exercise Physiologist    Warm-up and Cool-down  Not performed (comment)   med review   Resistance Training Performed  Yes    VAD Patient?  No    PAD/SET Patient?  No      Pain Assessment   Currently in Pain?  No/denies        Exercise Prescription Changes - 11/19/17 1200      Response to Exercise   Blood Pressure (Admit)  122/64    Blood Pressure (Exercise)  134/76    Blood Pressure (Exit)  124/64    Heart Rate (Admit)  63 bpm    Heart Rate (Exercise)  100 bpm    Heart Rate (Exit)  63 bpm    Oxygen Saturation (Admit)  100 %    Oxygen Saturation (Exercise)  100 %    Rating of Perceived Exertion (Exercise)  12    Symptoms  none    Comments  walk test results       Social History   Tobacco Use  Smoking Status Former Smoker  . Last attempt to quit: 09/02/1964  . Years since quitting: 53.2  Smokeless Tobacco Never Used    Goals Met:  Proper associated with RPD/PD & O2 Sat Exercise tolerated well Personal goals reviewed No report of cardiac concerns or symptoms Strength training completed today  Goals Unmet:  Not Applicable  Comments: Med Review completed    Dr. Emily Filbert is Medical Director for [facility] and [facility].

## 2017-11-19 NOTE — Progress Notes (Signed)
Cardiac Individual Treatment Plan  Patient Details  Name: Ray Lamb MRN: [id number] Date of Birth: [birth year] Referring Provider:     Cardiac Rehab from 11/19/2017 in [facility] Cardiac and Pulmonary Rehab  Referring Provider  Neoma Laming MD      Initial Encounter Date:    Cardiac Rehab from 11/19/2017 in [facility] Cardiac and Pulmonary Rehab  Date  11/19/17      Visit Diagnosis: NSTEMI (non-ST elevated myocardial infarction) [facility])  Status post coronary artery stent placement  Patient's Home Medications on Admission:  Current Outpatient Medications:  .  acetaminophen (TYLENOL) 325 MG tablet, Take 650 mg by mouth every 4 (four) hours as needed. for pain/ increased temp. May be administered orally, per G-tube if needed or rectally if unable to swallow (separate order). Maximum dose for 24 hours is 3,000 mg from all sources of Acetaminophen/ Tylenol, Disp: , Rfl:  .  atorvastatin (LIPITOR) 80 MG tablet, Take 1 tablet (80 mg total) by mouth at bedtime., Disp: 90 tablet, Rfl: 4 .  clopidogrel (PLAVIX) 75 MG tablet, Take 1 tablet (75 mg total) by mouth daily with breakfast., Disp: , Rfl:  .  donepezil (ARICEPT) 5 MG tablet, Take 5 mg by mouth at bedtime., Disp: , Rfl:  .  ezetimibe (ZETIA) 10 MG tablet, Take 1 tablet (10 mg total) by mouth daily., Disp: 90 tablet, Rfl: 4 .  furosemide (LASIX) 40 MG tablet, Take 40 mg by mouth daily as needed for fluid. , Disp: , Rfl:  .  glipiZIDE (GLUCOTROL) 5 MG tablet, Take 5 mg by mouth daily before breakfast., Disp: , Rfl:  .  hydrALAZINE (APRESOLINE) 25 MG tablet, , Disp: , Rfl:  .  isosorbide mononitrate (IMDUR) 30 MG 24 hr tablet, Take 30 mg by mouth daily. , Disp: , Rfl:  .  Melatonin 3 MG CAPS, Take 3 mg by mouth at bedtime. , Disp: , Rfl:  .  metoprolol tartrate (LOPRESSOR) 50 MG tablet, Take 1 tablet (50 mg total) by mouth every 12 (twelve) hours., Disp: , Rfl:  .  Multiple Vitamin (MULTIVITAMIN) tablet, Take 1 tablet by mouth daily., Disp: , Rfl:  .   Nutritional Supplements (FEEDING SUPPLEMENT, NEPRO CARB STEADY,) LIQD, Take 237 mLs by mouth 3 (three) times daily between meals., Disp: , Rfl: 0 .  sitaGLIPtin (JANUVIA) 25 MG tablet, Take 25 mg by mouth daily., Disp: , Rfl:  .  timolol (TIMOPTIC) 0.5 % ophthalmic solution, Place 1 drop into both eyes daily. , Disp: , Rfl:  .  TURMERIC PO, Take 1,300 mg by mouth daily. , Disp: , Rfl:  .  NON FORMULARY, Diet order: Downgrade patient to dysphagia 2 diet, continue with thin liquids; NO straws, add extra gravy., Disp: , Rfl:  .  senna-docusate (SENOKOT-S) 8.6-50 MG tablet, Take 1 tablet by mouth at bedtime as needed for mild constipation. (Patient not taking: Reported on 11/19/2017), Disp: , Rfl:   Past Medical History: Past Medical History:  Diagnosis Date  . Anemia   . Chronic kidney disease    STAGE 4  . Coronary atherosclerosis    CABG X 4  . Diabetes mellitus without complication ([place])   . Edema    ankle  . GERD (gastroesophageal reflux disease)   . Glaucoma   . Hearing loss   . Heart murmur   . History of hiatal hernia   . Hyperlipidemia   . Hypertension   . Mini stroke ([place])   . Mitral insufficiency   . Myocardial infarction (  [place])    mild, heart cath done no stents needed  . Stroke [facility]) 2011   TIA    Tobacco Use: Social History   Tobacco Use  Smoking Status Former Smoker  . Last attempt to quit: 09/02/1964  . Years since quitting: 53.2  Smokeless Tobacco Never Used    Labs: Recent Chemical engineer    Labs for ITP Cardiac and Pulmonary Rehab Latest Ref Rng & Units 09/04/2017 09/30/2017 10/09/2017 10/10/2017 11/06/2017   Cholestrol <200 mg/dL 106 - - - -   LDLCALC 0 - 99 mg/dL - - - - -   HDL >39 mg/dL - - - - -   Trlycerides <150 mg/dL 64 - - - -   Hemoglobin A1c 4.8 - 5.6 % - - - - 6.8(H)   PHART 7.350 - 7.450 - 7.20(L) 7.47(H) 7.48(H) -   PCO2ART 32.0 - 48.0 mmHg - 37 45 45 -   HCO3 20.0 - 28.0 mmol/L - 14.5(L) 32.8(H) 33.5(H) -   ACIDBASEDEF 0.0 - 2.0 mmol/L  - 12.6(H) - - -   O2SAT % - 91.0 91.1 97.9 -       Exercise Target Goals: Exercise Program Goal: Individual exercise prescription set using results from initial 6 min walk test and THRR while considering  patient's activity barriers and safety.   Exercise Prescription Goal: Initial exercise prescription builds to 30-45 minutes a day of aerobic activity, 2-3 days per week.  Home exercise guidelines will be given to patient during program as part of exercise prescription that the participant will acknowledge.  Activity Barriers & Risk Stratification: Activity Barriers & Cardiac Risk Stratification - 11/19/17 1220      Activity Barriers & Cardiac Risk Stratification   Activity Barriers  Decreased Ventricular Function;Deconditioning;Muscular Weakness;Balance Concerns;History of Falls;Assistive Device    Cardiac Risk Stratification  High       6 Minute Walk: 6 Minute Walk    Row Name 11/19/17 1219         6 Minute Walk   Phase  Initial     Distance  585 feet     Walk Time  6 minutes     # of Rest Breaks  0     MPH  1.11     METS  0.95     RPE  12     VO2 Peak  3.31     Symptoms  No     Resting HR  63 bpm     Resting BP  122/64     Resting Oxygen Saturation   100 %     Exercise Oxygen Saturation  during 6 min walk  100 %     Max Ex. HR  100 bpm     Max Ex. BP  134/76     2 Minute Post BP  124/64        Oxygen Initial Assessment:   Oxygen Re-Evaluation:   Oxygen Discharge (Final Oxygen Re-Evaluation):   Initial Exercise Prescription: Initial Exercise Prescription - 11/19/17 1200      Date of Initial Exercise RX and Referring Provider   Date  11/19/17    Referring Provider  Neoma Laming MD      Treadmill   MPH  0.8    Grade  0.5    Minutes  15    METs  1.7      Recumbant Bike   Level  1    RPM  50    Watts  10  Minutes  15    METs  1.7      NuStep   Level  1    SPM  80    Minutes  15    METs  1.7      Prescription Details   Frequency  (times per week)  2    Duration  Progress to 30 minutes of continuous aerobic without signs/symptoms of physical distress      Intensity   THRR 40-80% of Max Heartrate  92-121    Ratings of Perceived Exertion  11-13    Perceived Dyspnea  0-4      Progression   Progression  Continue to progress workloads to maintain intensity without signs/symptoms of physical distress.      Resistance Training   Training Prescription  Yes    Weight  3 lbs    Reps  10-15       Perform Capillary Blood Glucose checks as needed.  Exercise Prescription Changes: Exercise Prescription Changes    Row Name 11/19/17 1200             Response to Exercise   Blood Pressure (Admit)  122/64       Blood Pressure (Exercise)  134/76       Blood Pressure (Exit)  124/64       Heart Rate (Admit)  63 bpm       Heart Rate (Exercise)  100 bpm       Heart Rate (Exit)  63 bpm       Oxygen Saturation (Admit)  100 %       Oxygen Saturation (Exercise)  100 %       Rating of Perceived Exertion (Exercise)  12       Symptoms  none       Comments  walk test results          Exercise Comments:   Exercise Goals and Review: Exercise Goals    Row Name 11/19/17 1226             Exercise Goals   Increase Physical Activity  Yes       Intervention  Provide advice, education, support and counseling about physical activity/exercise needs.;Develop an individualized exercise prescription for aerobic and resistive training based on initial evaluation findings, risk stratification, comorbidities and participant's personal goals.       Expected Outcomes  Short Term: Attend rehab on a regular basis to increase amount of physical activity.;Long Term: Add in home exercise to make exercise part of routine and to increase amount of physical activity.;Long Term: Exercising regularly at least 3-5 days a week.       Increase Strength and Stamina  Yes       Intervention  Provide advice, education, support and counseling about  physical activity/exercise needs.;Develop an individualized exercise prescription for aerobic and resistive training based on initial evaluation findings, risk stratification, comorbidities and participant's personal goals.       Expected Outcomes  Short Term: Increase workloads from initial exercise prescription for resistance, speed, and METs.;Short Term: Perform resistance training exercises routinely during rehab and add in resistance training at home;Long Term: Improve cardiorespiratory fitness, muscular endurance and strength as measured by increased METs and functional capacity (6MWT)       Able to understand and use rate of perceived exertion (RPE) scale  Yes       Intervention  Provide education and explanation on how to use RPE scale       Expected  Outcomes  Short Term: Able to use RPE daily in rehab to express subjective intensity level;Long Term:  Able to use RPE to guide intensity level when exercising independently       Knowledge and understanding of Target Heart Rate Range (THRR)  Yes       Intervention  Provide education and explanation of THRR including how the numbers were predicted and where they are located for reference       Expected Outcomes  Short Term: Able to state/look up THRR;Short Term: Able to use daily as guideline for intensity in rehab;Long Term: Able to use THRR to govern intensity when exercising independently       Able to check pulse independently  Yes       Intervention  Provide education and demonstration on how to check pulse in carotid and radial arteries.;Review the importance of being able to check your own pulse for safety during independent exercise       Expected Outcomes  Short Term: Able to explain why pulse checking is important during independent exercise;Long Term: Able to check pulse independently and accurately       Understanding of Exercise Prescription  Yes       Intervention  Provide education, explanation, and written materials on patient's  individual exercise prescription       Expected Outcomes  Short Term: Able to explain program exercise prescription;Long Term: Able to explain home exercise prescription to exercise independently          Exercise Goals Re-Evaluation :   Discharge Exercise Prescription (Final Exercise Prescription Changes): Exercise Prescription Changes - 11/19/17 1200      Response to Exercise   Blood Pressure (Admit)  122/64    Blood Pressure (Exercise)  134/76    Blood Pressure (Exit)  124/64    Heart Rate (Admit)  63 bpm    Heart Rate (Exercise)  100 bpm    Heart Rate (Exit)  63 bpm    Oxygen Saturation (Admit)  100 %    Oxygen Saturation (Exercise)  100 %    Rating of Perceived Exertion (Exercise)  12    Symptoms  none    Comments  walk test results       Nutrition:  Target Goals: Understanding of nutrition guidelines, daily intake of sodium <1536m, cholesterol <2023m calories 30% from fat and 7% or less from saturated fats, daily to have 5 or more servings of fruits and vegetables.  Biometrics: Pre Biometrics - 11/19/17 1227      Pre Biometrics   Height  5' 5.3" (1.659 m)    Weight  153 lb 8 oz (69.6 kg)    Waist Circumference  35 inches    Hip Circumference  39 inches    Waist to Hip Ratio  0.9 %    BMI (Calculated)  25.3    Single Leg Stand  1.58 seconds        Nutrition Therapy Plan and Nutrition Goals: Nutrition Therapy & Goals - 11/19/17 1258      Intervention Plan   Intervention  Nutrition handout(s) given to patient.;Prescribe, educate and counsel regarding individualized specific dietary modifications aiming towards targeted core components such as weight, hypertension, lipid management, diabetes, heart failure and other comorbidities.    Expected Outcomes  Short Term Goal: Understand basic principles of dietary content, such as calories, fat, sodium, cholesterol and nutrients.;Short Term Goal: A plan has been developed with personal nutrition goals set during  dietitian appointment.;Long Term Goal: Adherence to  prescribed nutrition plan.       Nutrition Assessments: Nutrition Assessments - 11/19/17 1258      MEDFICTS Scores   Pre Score  29       Nutrition Goals Re-Evaluation:   Nutrition Goals Discharge (Final Nutrition Goals Re-Evaluation):   Psychosocial: Target Goals: Acknowledge presence or absence of significant depression and/or stress, maximize coping skills, provide positive support system. Participant is able to verbalize types and ability to use techniques and skills needed for reducing stress and depression.   Initial Review & Psychosocial Screening: Initial Psych Review & Screening - 11/19/17 1254      Initial Review   Current issues with  Current Sleep Concerns;Current Stress Concerns   Taysen reported sleeping decent before his heart attack. After staying in the hospital for 2 weeks and then in rehab for another 2 weeks, his sleep schedule got messed up. He is taking melatonin about every night, some nights it helps others not so much.    Source of Stress Concerns  Chronic Illness    Comments  Aldon has the beginning dementia. He feels like he has a pretty good grasp on the process and is doing everything he can to help himself. He also was put on dialysis during his hospital stay, but was taken off by discharge. He is very pleased with how his kidney function and A1C has improved in the last couple weeks. He and his wife love to travel so he is focusing on getting stronger so they can continue.       Family Dynamics   Good Support System?  Yes   wife and sons     Barriers   Psychosocial barriers to participate in program  There are no identifiable barriers or psychosocial needs.;The patient should benefit from training in stress management and relaxation.      Screening Interventions   Interventions  Encouraged to exercise;Program counselor consult;To provide support and resources with identified psychosocial needs;Provide  feedback about the scores to participant    Expected Outcomes  Short Term goal: Utilizing psychosocial counselor, staff and physician to assist with identification of specific Stressors or current issues interfering with healing process. Setting desired goal for each stressor or current issue identified.;Long Term Goal: Stressors or current issues are controlled or eliminated.;Short Term goal: Identification and review with participant of any Quality of Life or Depression concerns found by scoring the questionnaire.;Long Term goal: The participant improves quality of Life and PHQ9 Scores as seen by post scores and/or verbalization of changes       Quality of Life Scores:  Quality of Life - 11/19/17 1056      Quality of Life   Select  Quality of Life      Quality of Life Scores   Health/Function Pre  22.85 %    Socioeconomic Pre  29.17 %    Psych/Spiritual Pre  24.64 %    Family Pre  22.8 %    GLOBAL Pre  25.44 %      Scores of 19 and below usually indicate a poorer quality of life in these areas.  A difference of  2-3 points is a clinically meaningful difference.  A difference of 2-3 points in the total score of the Quality of Life Index has been associated with significant improvement in overall quality of life, self-image, physical symptoms, and general health in studies assessing change in quality of life.  PHQ-9: Recent Review Flowsheet Data    Depression screen [facility] 2/9 11/19/2017 06/05/2017  06/05/2017 02/26/2017 12/11/2016   Decreased Interest 2 0 0 0 0   Down, Depressed, Hopeless 0 0 0 0 0   PHQ - 2 Score 2 0 0 0 0   Altered sleeping 3 - - - 1   Tired, decreased energy 3 - - - 1   Change in appetite 1 - - - 0   Feeling bad or failure about yourself  1 - - - 0   Trouble concentrating 0 - - - 0   Moving slowly or fidgety/restless 0 - - - 0   Suicidal thoughts 0 - - - 0   PHQ-9 Score 10 - - - 2   Difficult doing work/chores Somewhat difficult - - - Not difficult at all      Interpretation of Total Score  Total Score Depression Severity:  1-4 = Minimal depression, 5-9 = Mild depression, 10-14 = Moderate depression, 15-19 = Moderately severe depression, 20-27 = Severe depression   Psychosocial Evaluation and Intervention:   Psychosocial Re-Evaluation:   Psychosocial Discharge (Final Psychosocial Re-Evaluation):   Vocational Rehabilitation: Provide vocational rehab assistance to qualifying candidates.   Vocational Rehab Evaluation & Intervention: Vocational Rehab - 11/19/17 1254      Initial Vocational Rehab Evaluation & Intervention   Assessment shows need for Vocational Rehabilitation  No       Education: Education Goals: Education classes will be provided on a variety of topics geared toward better understanding of heart health and risk factor modification. Participant will state understanding/return demonstration of topics presented as noted by education test scores.  Learning Barriers/Preferences: Learning Barriers/Preferences - 11/19/17 1254      Learning Barriers/Preferences   Learning Barriers  Hearing    Learning Preferences  Verbal Instruction;Written Material       Education Topics:  AED/CPR: - Group verbal and written instruction with the use of models to demonstrate the basic use of the AED with the basic ABC's of resuscitation.   General Nutrition Guidelines/Fats and Fiber: -Group instruction provided by verbal, written material, models and posters to present the general guidelines for heart healthy nutrition. Gives an explanation and review of dietary fats and fiber.   Controlling Sodium/Reading Food Labels: -Group verbal and written material supporting the discussion of sodium use in heart healthy nutrition. Review and explanation with models, verbal and written materials for utilization of the food label.   Exercise Physiology & General Exercise Guidelines: - Group verbal and written instruction with models to review  the exercise physiology of the cardiovascular system and associated critical values. Provides general exercise guidelines with specific guidelines to those with heart or lung disease.    Aerobic Exercise & Resistance Training: - Gives group verbal and written instruction on the various components of exercise. Focuses on aerobic and resistive training programs and the benefits of this training and how to safely progress through these programs..   Flexibility, Balance, Mind/Body Relaxation: Provides group verbal/written instruction on the benefits of flexibility and balance training, including mind/body exercise modes such as yoga, pilates and tai chi.  Demonstration and skill practice provided.   Stress and Anxiety: - Provides group verbal and written instruction about the health risks of elevated stress and causes of high stress.  Discuss the correlation between heart/lung disease and anxiety and treatment options. Review healthy ways to manage with stress and anxiety.   Depression: - Provides group verbal and written instruction on the correlation between heart/lung disease and depressed mood, treatment options, and the stigmas associated with seeking treatment.  Anatomy & Physiology of the Heart: - Group verbal and written instruction and models provide basic cardiac anatomy and physiology, with the coronary electrical and arterial systems. Review of Valvular disease and Heart Failure   Cardiac Procedures: - Group verbal and written instruction to review commonly prescribed medications for heart disease. Reviews the medication, class of the drug, and side effects. Includes the steps to properly store meds and maintain the prescription regimen. (beta blockers and nitrates)   Cardiac Medications I: - Group verbal and written instruction to review commonly prescribed medications for heart disease. Reviews the medication, class of the drug, and side effects. Includes the steps to properly  store meds and maintain the prescription regimen.   Cardiac Medications II: -Group verbal and written instruction to review commonly prescribed medications for heart disease. Reviews the medication, class of the drug, and side effects. (all other drug classes)    Go Sex-Intimacy & Heart Disease, Get SMART - Goal Setting: - Group verbal and written instruction through game format to discuss heart disease and the return to sexual intimacy. Provides group verbal and written material to discuss and apply goal setting through the application of the S.M.A.R.T. Method.   Other Matters of the Heart: - Provides group verbal, written materials and models to describe Stable Angina and Peripheral Artery. Includes description of the disease process and treatment options available to the cardiac patient.   Exercise & Equipment Safety: - Individual verbal instruction and demonstration of equipment use and safety with use of the equipment.   Cardiac Rehab from 11/19/2017 in [facility] Cardiac and Pulmonary Rehab  Date  11/19/17  Educator  [facility]  Instruction Review Code  1- Verbalizes Understanding      Infection Prevention: - Provides verbal and written material to individual with discussion of infection control including proper hand washing and proper equipment cleaning during exercise session.   Cardiac Rehab from 11/19/2017 in [facility] Cardiac and Pulmonary Rehab  Date  11/19/17  Educator  [facility]  Instruction Review Code  1- Verbalizes Understanding      Falls Prevention: - Provides verbal and written material to individual with discussion of falls prevention and safety.   Cardiac Rehab from 11/19/2017 in [facility] Cardiac and Pulmonary Rehab  Date  11/19/17  Educator  [facility]  Instruction Review Code  1- Verbalizes Understanding      Diabetes: - Individual verbal and written instruction to review signs/symptoms of diabetes, desired ranges of glucose level fasting, after meals and with exercise. Acknowledge that  pre and post exercise glucose checks will be done for 3 sessions at entry of program.   Cardiac Rehab from 11/19/2017 in [facility] Cardiac and Pulmonary Rehab  Date  11/19/17  Educator  [facility]  Instruction Review Code  1- Verbalizes Understanding      Know Your Numbers and Risk Factors: -Group verbal and written instruction about important numbers in your health.  Discussion of what are risk factors and how they play a role in the disease process.  Review of Cholesterol, Blood Pressure, Diabetes, and BMI and the role they play in your overall health.   Sleep Hygiene: -Provides group verbal and written instruction about how sleep can affect your health.  Define sleep hygiene, discuss sleep cycles and impact of sleep habits. Review good sleep hygiene tips.    Other: -Provides group and verbal instruction on various topics (see comments)   Knowledge Questionnaire Score: Knowledge Questionnaire Score - 11/19/17 1057      Knowledge Questionnaire Score   Pre  Score  19/26   Test reviewed with pt today.  Education Focus: A&P, Angina, Nutrition, Exercise, and Tobacco      Core Components/Risk Factors/Patient Goals at Admission: Personal Goals and Risk Factors at Admission - 11/19/17 1253      Core Components/Risk Factors/Patient Goals on Admission    Weight Management  Yes;Weight Maintenance    Intervention  Weight Management: Develop a combined nutrition and exercise program designed to reach desired caloric intake, while maintaining appropriate intake of nutrient and fiber, sodium and fats, and appropriate energy expenditure required for the weight goal.;Weight Management: Provide education and appropriate resources to help participant work on and attain dietary goals.    Admit Weight  153 lb (69.4 kg)   wants to stay between 150-155 lb   Expected Outcomes  Short Term: Continue to assess and modify interventions until short term weight is achieved;Long Term: Adherence to nutrition and physical  activity/exercise program aimed toward attainment of established weight goal;Weight Maintenance: Understanding of the daily nutrition guidelines, which includes 25-35% calories from fat, 7% or less cal from saturated fats, less than 267m cholesterol, less than 1.5gm of sodium, & 5 or more servings of fruits and vegetables daily;Understanding recommendations for meals to include 15-35% energy as protein, 25-35% energy from fat, 35-60% energy from carbohydrates, less than 2021mof dietary cholesterol, 20-35 gm of total fiber daily;Understanding of distribution of calorie intake throughout the day with the consumption of 4-5 meals/snacks    Diabetes  Yes    Intervention  Provide education about signs/symptoms and action to take for hypo/hyperglycemia.;Provide education about proper nutrition, including hydration, and aerobic/resistive exercise prescription along with prescribed medications to achieve blood glucose in normal ranges: Fasting glucose 65-99 mg/dL    Expected Outcomes  Short Term: Participant verbalizes understanding of the signs/symptoms and immediate care of hyper/hypoglycemia, proper foot care and importance of medication, aerobic/resistive exercise and nutrition plan for blood glucose control.;Long Term: Attainment of HbA1C < 7%.    Hypertension  Yes    Intervention  Provide education on lifestyle modifcations including regular physical activity/exercise, weight management, moderate sodium restriction and increased consumption of fresh fruit, vegetables, and low fat dairy, alcohol moderation, and smoking cessation.;Monitor prescription use compliance.    Expected Outcomes  Short Term: Continued assessment and intervention until BP is < 140/9066mG in hypertensive participants. < 130/33m36m in hypertensive participants with diabetes, heart failure or chronic kidney disease.;Long Term: Maintenance of blood pressure at goal levels.    Lipids  Yes    Intervention  Provide education and support for  participant on nutrition & aerobic/resistive exercise along with prescribed medications to achieve LDL <70mg42mL >40mg.62mExpected Outcomes  Short Term: Participant states understanding of desired cholesterol values and is compliant with medications prescribed. Participant is following exercise prescription and nutrition guidelines.;Long Term: Cholesterol controlled with medications as prescribed, with individualized exercise RX and with personalized nutrition plan. Value goals: LDL < 70mg, 28m> 40 mg.       Core Components/Risk Factors/Patient Goals Review:    Core Components/Risk Factors/Patient Goals at Discharge (Final Review):    ITP Comments: ITP Comments    Row Name 11/19/17 1249           ITP Comments  Med Review completed. Initial ITP created. [facility]          Comments: Initial ITP

## 2017-11-19 NOTE — Patient Instructions (Signed)
Patient Instructions  Patient Details  Name: Ray Lamb MRN: [id number] Date of Birth: 01/05/1931 Referring Provider:  Isaias Cowman, MD  Below are your personal goals for exercise, nutrition, and risk factors. Our goal is to help you stay on track towards obtaining and maintaining these goals. We will be discussing your progress on these goals with you throughout the program.  Initial Exercise Prescription: Initial Exercise Prescription - 11/19/17 1200      Date of Initial Exercise RX and Referring Provider   Date  11/19/17    Referring Provider  Neoma Laming MD      Treadmill   MPH  0.8    Grade  0.5    Minutes  15    METs  1.7      Recumbant Bike   Level  1    RPM  50    Watts  10    Minutes  15    METs  1.7      NuStep   Level  1    SPM  80    Minutes  15    METs  1.7      Prescription Details   Frequency (times per week)  2    Duration  Progress to 30 minutes of continuous aerobic without signs/symptoms of physical distress      Intensity   THRR 40-80% of Max Heartrate  92-121    Ratings of Perceived Exertion  11-13    Perceived Dyspnea  0-4      Progression   Progression  Continue to progress workloads to maintain intensity without signs/symptoms of physical distress.      Resistance Training   Training Prescription  Yes    Weight  3 lbs    Reps  10-15       Exercise Goals: Frequency: Be able to perform aerobic exercise two to three times per week in program working toward 2-5 days per week of home exercise.  Intensity: Work with a perceived exertion of 11 (fairly light) - 15 (hard) while following your exercise prescription.  We will make changes to your prescription with you as you progress through the program.   Duration: Be able to do 30 to 45 minutes of continuous aerobic exercise in addition to a 5 minute warm-up and a 5 minute cool-down routine.   Nutrition Goals: Your personal nutrition goals will be established when you do your  nutrition analysis with the dietician.  The following are general nutrition guidelines to follow: Cholesterol < 200mg /day Sodium < 1500mg /day Fiber: Men over 50 yrs - 30 grams per day  Personal Goals: Personal Goals and Risk Factors at Admission - 11/19/17 1253      Core Components/Risk Factors/Patient Goals on Admission    Weight Management  Yes;Weight Maintenance    Intervention  Weight Management: Develop a combined nutrition and exercise program designed to reach desired caloric intake, while maintaining appropriate intake of nutrient and fiber, sodium and fats, and appropriate energy expenditure required for the weight goal.;Weight Management: Provide education and appropriate resources to help participant work on and attain dietary goals.    Admit Weight  153 lb (69.4 kg)   wants to stay between 150-155 lb   Expected Outcomes  Short Term: Continue to assess and modify interventions until short term weight is achieved;Long Term: Adherence to nutrition and physical activity/exercise program aimed toward attainment of established weight goal;Weight Maintenance: Understanding of the daily nutrition guidelines, which includes 25-35% calories from fat, 7% or  less cal from saturated fats, less than 200mg  cholesterol, less than 1.5gm of sodium, & 5 or more servings of fruits and vegetables daily;Understanding recommendations for meals to include 15-35% energy as protein, 25-35% energy from fat, 35-60% energy from carbohydrates, less than 200mg  of dietary cholesterol, 20-35 gm of total fiber daily;Understanding of distribution of calorie intake throughout the day with the consumption of 4-5 meals/snacks    Diabetes  Yes    Intervention  Provide education about signs/symptoms and action to take for hypo/hyperglycemia.;Provide education about proper nutrition, including hydration, and aerobic/resistive exercise prescription along with prescribed medications to achieve blood glucose in normal ranges:  Fasting glucose 65-99 mg/dL    Expected Outcomes  Short Term: Participant verbalizes understanding of the signs/symptoms and immediate care of hyper/hypoglycemia, proper foot care and importance of medication, aerobic/resistive exercise and nutrition plan for blood glucose control.;Long Term: Attainment of HbA1C < 7%.    Hypertension  Yes    Intervention  Provide education on lifestyle modifcations including regular physical activity/exercise, weight management, moderate sodium restriction and increased consumption of fresh fruit, vegetables, and low fat dairy, alcohol moderation, and smoking cessation.;Monitor prescription use compliance.    Expected Outcomes  Short Term: Continued assessment and intervention until BP is < 140/79mm HG in hypertensive participants. < 130/28mm HG in hypertensive participants with diabetes, heart failure or chronic kidney disease.;Long Term: Maintenance of blood pressure at goal levels.    Lipids  Yes    Intervention  Provide education and support for participant on nutrition & aerobic/resistive exercise along with prescribed medications to achieve LDL 70mg , HDL >40mg .    Expected Outcomes  Short Term: Participant states understanding of desired cholesterol values and is compliant with medications prescribed. Participant is following exercise prescription and nutrition guidelines.;Long Term: Cholesterol controlled with medications as prescribed, with individualized exercise RX and with personalized nutrition plan. Value goals: LDL < 70mg , HDL > 40 mg.       Tobacco Use Initial Evaluation: Social History   Tobacco Use  Smoking Status Former Smoker  . Last attempt to quit: 09/02/1964  . Years since quitting: 53.2  Smokeless Tobacco Never Used    Exercise Goals and Review: Exercise Goals    Row Name 11/19/17 1226             Exercise Goals   Increase Physical Activity  Yes       Intervention  Provide advice, education, support and counseling about physical  activity/exercise needs.;Develop an individualized exercise prescription for aerobic and resistive training based on initial evaluation findings, risk stratification, comorbidities and participant's personal goals.       Expected Outcomes  Short Term: Attend rehab on a regular basis to increase amount of physical activity.;Long Term: Add in home exercise to make exercise part of routine and to increase amount of physical activity.;Long Term: Exercising regularly at least 3-5 days a week.       Increase Strength and Stamina  Yes       Intervention  Provide advice, education, support and counseling about physical activity/exercise needs.;Develop an individualized exercise prescription for aerobic and resistive training based on initial evaluation findings, risk stratification, comorbidities and participant's personal goals.       Expected Outcomes  Short Term: Increase workloads from initial exercise prescription for resistance, speed, and METs.;Short Term: Perform resistance training exercises routinely during rehab and add in resistance training at home;Long Term: Improve cardiorespiratory fitness, muscular endurance and strength as measured by increased METs and functional capacity (6MWT)  Able to understand and use rate of perceived exertion (RPE) scale  Yes       Intervention  Provide education and explanation on how to use RPE scale       Expected Outcomes  Short Term: Able to use RPE daily in rehab to express subjective intensity level;Long Term:  Able to use RPE to guide intensity level when exercising independently       Knowledge and understanding of Target Heart Rate Range (THRR)  Yes       Intervention  Provide education and explanation of THRR including how the numbers were predicted and where they are located for reference       Expected Outcomes  Short Term: Able to state/look up THRR;Short Term: Able to use daily as guideline for intensity in rehab;Long Term: Able to use THRR to govern  intensity when exercising independently       Able to check pulse independently  Yes       Intervention  Provide education and demonstration on how to check pulse in carotid and radial arteries.;Review the importance of being able to check your own pulse for safety during independent exercise       Expected Outcomes  Short Term: Able to explain why pulse checking is important during independent exercise;Long Term: Able to check pulse independently and accurately       Understanding of Exercise Prescription  Yes       Intervention  Provide education, explanation, and written materials on patient's individual exercise prescription       Expected Outcomes  Short Term: Able to explain program exercise prescription;Long Term: Able to explain home exercise prescription to exercise independently          Copy of goals given to participant.

## 2017-11-21 ENCOUNTER — Encounter: Payer: Self-pay | Admitting: *Deleted

## 2017-11-21 DIAGNOSIS — Z955 Presence of coronary angioplasty implant and graft: Secondary | ICD-10-CM

## 2017-11-21 DIAGNOSIS — I214 Non-ST elevation (NSTEMI) myocardial infarction: Secondary | ICD-10-CM

## 2017-11-21 NOTE — Progress Notes (Signed)
Cardiac Individual Treatment Plan  Patient Details  Name: Ray Lamb MRN: [id number] Date of Birth: Oct 21, 1930 Referring Provider:     Cardiac Rehab from 11/19/2017 in [facility] Cardiac and Pulmonary Rehab  Referring Provider  Neoma Laming MD      Initial Encounter Date:    Cardiac Rehab from 11/19/2017 in [facility] Cardiac and Pulmonary Rehab  Date  11/19/17      Visit Diagnosis: NSTEMI (non-ST elevated myocardial infarction) [facility])  Status post coronary artery stent placement  Patient's Home Medications on Admission:  Current Outpatient Medications:  .  acetaminophen (TYLENOL) 325 MG tablet, Take 650 mg by mouth every 4 (four) hours as needed. for pain/ increased temp. May be administered orally, per G-tube if needed or rectally if unable to swallow (separate order). Maximum dose for 24 hours is 3,000 mg from all sources of Acetaminophen/ Tylenol, Disp: , Rfl:  .  atorvastatin (LIPITOR) 80 MG tablet, Take 1 tablet (80 mg total) by mouth at bedtime., Disp: 90 tablet, Rfl: 4 .  clopidogrel (PLAVIX) 75 MG tablet, Take 1 tablet (75 mg total) by mouth daily with breakfast., Disp: , Rfl:  .  donepezil (ARICEPT) 5 MG tablet, Take 5 mg by mouth at bedtime., Disp: , Rfl:  .  ezetimibe (ZETIA) 10 MG tablet, Take 1 tablet (10 mg total) by mouth daily., Disp: 90 tablet, Rfl: 4 .  furosemide (LASIX) 40 MG tablet, Take 40 mg by mouth daily as needed for fluid. , Disp: , Rfl:  .  glipiZIDE (GLUCOTROL) 5 MG tablet, Take 5 mg by mouth daily before breakfast., Disp: , Rfl:  .  hydrALAZINE (APRESOLINE) 25 MG tablet, , Disp: , Rfl:  .  isosorbide mononitrate (IMDUR) 30 MG 24 hr tablet, Take 30 mg by mouth daily. , Disp: , Rfl:  .  Melatonin 3 MG CAPS, Take 3 mg by mouth at bedtime. , Disp: , Rfl:  .  metoprolol tartrate (LOPRESSOR) 50 MG tablet, Take 1 tablet (50 mg total) by mouth every 12 (twelve) hours., Disp: , Rfl:  .  Multiple Vitamin (MULTIVITAMIN) tablet, Take 1 tablet by mouth daily., Disp: , Rfl:  .   NON FORMULARY, Diet order: Downgrade patient to dysphagia 2 diet, continue with thin liquids; NO straws, add extra gravy., Disp: , Rfl:  .  Nutritional Supplements (FEEDING SUPPLEMENT, NEPRO CARB STEADY,) LIQD, Take 237 mLs by mouth 3 (three) times daily between meals., Disp: , Rfl: 0 .  senna-docusate (SENOKOT-S) 8.6-50 MG tablet, Take 1 tablet by mouth at bedtime as needed for mild constipation. (Patient not taking: Reported on 11/19/2017), Disp: , Rfl:  .  sitaGLIPtin (JANUVIA) 25 MG tablet, Take 25 mg by mouth daily., Disp: , Rfl:  .  timolol (TIMOPTIC) 0.5 % ophthalmic solution, Place 1 drop into both eyes daily. , Disp: , Rfl:  .  TURMERIC PO, Take 1,300 mg by mouth daily. , Disp: , Rfl:   Past Medical History: Past Medical History:  Diagnosis Date  . Anemia   . Chronic kidney disease    STAGE 4  . Coronary atherosclerosis    CABG X 4  . Diabetes mellitus without complication ([place])   . Edema    ankle  . GERD (gastroesophageal reflux disease)   . Glaucoma   . Hearing loss   . Heart murmur   . History of hiatal hernia   . Hyperlipidemia   . Hypertension   . Mini stroke ([place])   . Mitral insufficiency   . Myocardial infarction (  [place])    mild, heart cath done no stents needed  . Stroke [facility]) 2011   TIA    Tobacco Use: Social History   Tobacco Use  Smoking Status Former Smoker  . Last attempt to quit: 09/02/1964  . Years since quitting: 53.2  Smokeless Tobacco Never Used    Labs: Recent Chemical engineer    Labs for ITP Cardiac and Pulmonary Rehab Latest Ref Rng & Units 09/04/2017 09/30/2017 10/09/2017 10/10/2017 11/06/2017   Cholestrol <200 mg/dL 106 - - - -   LDLCALC 0 - 99 mg/dL - - - - -   HDL >39 mg/dL - - - - -   Trlycerides <150 mg/dL 64 - - - -   Hemoglobin A1c 4.8 - 5.6 % - - - - 6.8(H)   PHART 7.350 - 7.450 - 7.20(L) 7.47(H) 7.48(H) -   PCO2ART 32.0 - 48.0 mmHg - 37 45 45 -   HCO3 20.0 - 28.0 mmol/L - 14.5(L) 32.8(H) 33.5(H) -   ACIDBASEDEF 0.0 - 2.0 mmol/L  - 12.6(H) - - -   O2SAT % - 91.0 91.1 97.9 -       Exercise Target Goals: Exercise Program Goal: Individual exercise prescription set using results from initial 6 min walk test and THRR while considering  patient's activity barriers and safety.   Exercise Prescription Goal: Initial exercise prescription builds to 30-45 minutes a day of aerobic activity, 2-3 days per week.  Home exercise guidelines will be given to patient during program as part of exercise prescription that the participant will acknowledge.  Activity Barriers & Risk Stratification: Activity Barriers & Cardiac Risk Stratification - 11/19/17 1220      Activity Barriers & Cardiac Risk Stratification   Activity Barriers  Decreased Ventricular Function;Deconditioning;Muscular Weakness;Balance Concerns;History of Falls;Assistive Device    Cardiac Risk Stratification  High       6 Minute Walk: 6 Minute Walk    Row Name 11/19/17 1219         6 Minute Walk   Phase  Initial     Distance  585 feet     Walk Time  6 minutes     # of Rest Breaks  0     MPH  1.11     METS  0.95     RPE  12     VO2 Peak  3.31     Symptoms  No     Resting HR  63 bpm     Resting BP  122/64     Resting Oxygen Saturation   100 %     Exercise Oxygen Saturation  during 6 min walk  100 %     Max Ex. HR  100 bpm     Max Ex. BP  134/76     2 Minute Post BP  124/64        Oxygen Initial Assessment:   Oxygen Re-Evaluation:   Oxygen Discharge (Final Oxygen Re-Evaluation):   Initial Exercise Prescription: Initial Exercise Prescription - 11/19/17 1200      Date of Initial Exercise RX and Referring Provider   Date  11/19/17    Referring Provider  Neoma Laming MD      Treadmill   MPH  0.8    Grade  0.5    Minutes  15    METs  1.7      Recumbant Bike   Level  1    RPM  50    Watts  10  Minutes  15    METs  1.7      NuStep   Level  1    SPM  80    Minutes  15    METs  1.7      Prescription Details   Frequency  (times per week)  2    Duration  Progress to 30 minutes of continuous aerobic without signs/symptoms of physical distress      Intensity   THRR 40-80% of Max Heartrate  92-121    Ratings of Perceived Exertion  11-13    Perceived Dyspnea  0-4      Progression   Progression  Continue to progress workloads to maintain intensity without signs/symptoms of physical distress.      Resistance Training   Training Prescription  Yes    Weight  3 lbs    Reps  10-15       Perform Capillary Blood Glucose checks as needed.  Exercise Prescription Changes: Exercise Prescription Changes    Row Name 11/19/17 1200             Response to Exercise   Blood Pressure (Admit)  122/64       Blood Pressure (Exercise)  134/76       Blood Pressure (Exit)  124/64       Heart Rate (Admit)  63 bpm       Heart Rate (Exercise)  100 bpm       Heart Rate (Exit)  63 bpm       Oxygen Saturation (Admit)  100 %       Oxygen Saturation (Exercise)  100 %       Rating of Perceived Exertion (Exercise)  12       Symptoms  none       Comments  walk test results          Exercise Comments:   Exercise Goals and Review: Exercise Goals    Row Name 11/19/17 1226             Exercise Goals   Increase Physical Activity  Yes       Intervention  Provide advice, education, support and counseling about physical activity/exercise needs.;Develop an individualized exercise prescription for aerobic and resistive training based on initial evaluation findings, risk stratification, comorbidities and participant's personal goals.       Expected Outcomes  Short Term: Attend rehab on a regular basis to increase amount of physical activity.;Long Term: Add in home exercise to make exercise part of routine and to increase amount of physical activity.;Long Term: Exercising regularly at least 3-5 days a week.       Increase Strength and Stamina  Yes       Intervention  Provide advice, education, support and counseling about  physical activity/exercise needs.;Develop an individualized exercise prescription for aerobic and resistive training based on initial evaluation findings, risk stratification, comorbidities and participant's personal goals.       Expected Outcomes  Short Term: Increase workloads from initial exercise prescription for resistance, speed, and METs.;Short Term: Perform resistance training exercises routinely during rehab and add in resistance training at home;Long Term: Improve cardiorespiratory fitness, muscular endurance and strength as measured by increased METs and functional capacity (6MWT)       Able to understand and use rate of perceived exertion (RPE) scale  Yes       Intervention  Provide education and explanation on how to use RPE scale       Expected  Outcomes  Short Term: Able to use RPE daily in rehab to express subjective intensity level;Long Term:  Able to use RPE to guide intensity level when exercising independently       Knowledge and understanding of Target Heart Rate Range (THRR)  Yes       Intervention  Provide education and explanation of THRR including how the numbers were predicted and where they are located for reference       Expected Outcomes  Short Term: Able to state/look up THRR;Short Term: Able to use daily as guideline for intensity in rehab;Long Term: Able to use THRR to govern intensity when exercising independently       Able to check pulse independently  Yes       Intervention  Provide education and demonstration on how to check pulse in carotid and radial arteries.;Review the importance of being able to check your own pulse for safety during independent exercise       Expected Outcomes  Short Term: Able to explain why pulse checking is important during independent exercise;Long Term: Able to check pulse independently and accurately       Understanding of Exercise Prescription  Yes       Intervention  Provide education, explanation, and written materials on patient's  individual exercise prescription       Expected Outcomes  Short Term: Able to explain program exercise prescription;Long Term: Able to explain home exercise prescription to exercise independently          Exercise Goals Re-Evaluation :   Discharge Exercise Prescription (Final Exercise Prescription Changes): Exercise Prescription Changes - 11/19/17 1200      Response to Exercise   Blood Pressure (Admit)  122/64    Blood Pressure (Exercise)  134/76    Blood Pressure (Exit)  124/64    Heart Rate (Admit)  63 bpm    Heart Rate (Exercise)  100 bpm    Heart Rate (Exit)  63 bpm    Oxygen Saturation (Admit)  100 %    Oxygen Saturation (Exercise)  100 %    Rating of Perceived Exertion (Exercise)  12    Symptoms  none    Comments  walk test results       Nutrition:  Target Goals: Understanding of nutrition guidelines, daily intake of sodium <1559m, cholesterol <2030m calories 30% from fat and 7% or less from saturated fats, daily to have 5 or more servings of fruits and vegetables.  Biometrics: Pre Biometrics - 11/19/17 1227      Pre Biometrics   Height  5' 5.3" (1.659 m)    Weight  153 lb 8 oz (69.6 kg)    Waist Circumference  35 inches    Hip Circumference  39 inches    Waist to Hip Ratio  0.9 %    BMI (Calculated)  25.3    Single Leg Stand  1.58 seconds        Nutrition Therapy Plan and Nutrition Goals: Nutrition Therapy & Goals - 11/19/17 1258      Intervention Plan   Intervention  Nutrition handout(s) given to patient.;Prescribe, educate and counsel regarding individualized specific dietary modifications aiming towards targeted core components such as weight, hypertension, lipid management, diabetes, heart failure and other comorbidities.    Expected Outcomes  Short Term Goal: Understand basic principles of dietary content, such as calories, fat, sodium, cholesterol and nutrients.;Short Term Goal: A plan has been developed with personal nutrition goals set during  dietitian appointment.;Long Term Goal: Adherence to  prescribed nutrition plan.       Nutrition Assessments: Nutrition Assessments - 11/19/17 1258      MEDFICTS Scores   Pre Score  29       Nutrition Goals Re-Evaluation:   Nutrition Goals Discharge (Final Nutrition Goals Re-Evaluation):   Psychosocial: Target Goals: Acknowledge presence or absence of significant depression and/or stress, maximize coping skills, provide positive support system. Participant is able to verbalize types and ability to use techniques and skills needed for reducing stress and depression.   Initial Review & Psychosocial Screening: Initial Psych Review & Screening - 11/19/17 1254      Initial Review   Current issues with  Current Sleep Concerns;Current Stress Concerns   Ray Lamb reported sleeping decent before his heart attack. After staying in the hospital for 2 weeks and then in rehab for another 2 weeks, his sleep schedule got messed up. He is taking melatonin about every night, some nights it helps others not so much.    Source of Stress Concerns  Chronic Illness    Comments  Ray Lamb has the beginning dementia. He feels like he has a pretty good grasp on the process and is doing everything he can to help himself. He also was put on dialysis during his hospital stay, but was taken off by discharge. He is very pleased with how his kidney function and A1C has improved in the last couple weeks. He and his wife love to travel so he is focusing on getting stronger so they can continue.       Family Dynamics   Good Support System?  Yes   wife and sons     Barriers   Psychosocial barriers to participate in program  There are no identifiable barriers or psychosocial needs.;The patient should benefit from training in stress management and relaxation.      Screening Interventions   Interventions  Encouraged to exercise;Program counselor consult;To provide support and resources with identified psychosocial needs;Provide  feedback about the scores to participant    Expected Outcomes  Short Term goal: Utilizing psychosocial counselor, staff and physician to assist with identification of specific Stressors or current issues interfering with healing process. Setting desired goal for each stressor or current issue identified.;Long Term Goal: Stressors or current issues are controlled or eliminated.;Short Term goal: Identification and review with participant of any Quality of Life or Depression concerns found by scoring the questionnaire.;Long Term goal: The participant improves quality of Life and PHQ9 Scores as seen by post scores and/or verbalization of changes       Quality of Life Scores:  Quality of Life - 11/19/17 1056      Quality of Life   Select  Quality of Life      Quality of Life Scores   Health/Function Pre  22.85 %    Socioeconomic Pre  29.17 %    Psych/Spiritual Pre  24.64 %    Family Pre  22.8 %    GLOBAL Pre  25.44 %      Scores of 19 and below usually indicate a poorer quality of life in these areas.  A difference of  2-3 points is a clinically meaningful difference.  A difference of 2-3 points in the total score of the Quality of Life Index has been associated with significant improvement in overall quality of life, self-image, physical symptoms, and general health in studies assessing change in quality of life.  PHQ-9: Recent Review Flowsheet Data    Depression screen [facility] 2/9 11/19/2017 06/05/2017  06/05/2017 02/26/2017 12/11/2016   Decreased Interest 2 0 0 0 0   Down, Depressed, Hopeless 0 0 0 0 0   PHQ - 2 Score 2 0 0 0 0   Altered sleeping 3 - - - 1   Tired, decreased energy 3 - - - 1   Change in appetite 1 - - - 0   Feeling bad or failure about yourself  1 - - - 0   Trouble concentrating 0 - - - 0   Moving slowly or fidgety/restless 0 - - - 0   Suicidal thoughts 0 - - - 0   PHQ-9 Score 10 - - - 2   Difficult doing work/chores Somewhat difficult - - - Not difficult at all      Interpretation of Total Score  Total Score Depression Severity:  1-4 = Minimal depression, 5-9 = Mild depression, 10-14 = Moderate depression, 15-19 = Moderately severe depression, 20-27 = Severe depression   Psychosocial Evaluation and Intervention:   Psychosocial Re-Evaluation:   Psychosocial Discharge (Final Psychosocial Re-Evaluation):   Vocational Rehabilitation: Provide vocational rehab assistance to qualifying candidates.   Vocational Rehab Evaluation & Intervention: Vocational Rehab - 11/19/17 1254      Initial Vocational Rehab Evaluation & Intervention   Assessment shows need for Vocational Rehabilitation  No       Education: Education Goals: Education classes will be provided on a variety of topics geared toward better understanding of heart health and risk factor modification. Participant will state understanding/return demonstration of topics presented as noted by education test scores.  Learning Barriers/Preferences: Learning Barriers/Preferences - 11/19/17 1254      Learning Barriers/Preferences   Learning Barriers  Hearing    Learning Preferences  Verbal Instruction;Written Material       Education Topics:  AED/CPR: - Group verbal and written instruction with the use of models to demonstrate the basic use of the AED with the basic ABC's of resuscitation.   General Nutrition Guidelines/Fats and Fiber: -Group instruction provided by verbal, written material, models and posters to present the general guidelines for heart healthy nutrition. Gives an explanation and review of dietary fats and fiber.   Controlling Sodium/Reading Food Labels: -Group verbal and written material supporting the discussion of sodium use in heart healthy nutrition. Review and explanation with models, verbal and written materials for utilization of the food label.   Exercise Physiology & General Exercise Guidelines: - Group verbal and written instruction with models to review  the exercise physiology of the cardiovascular system and associated critical values. Provides general exercise guidelines with specific guidelines to those with heart or lung disease.    Aerobic Exercise & Resistance Training: - Gives group verbal and written instruction on the various components of exercise. Focuses on aerobic and resistive training programs and the benefits of this training and how to safely progress through these programs..   Flexibility, Balance, Mind/Body Relaxation: Provides group verbal/written instruction on the benefits of flexibility and balance training, including mind/body exercise modes such as yoga, pilates and tai chi.  Demonstration and skill practice provided.   Stress and Anxiety: - Provides group verbal and written instruction about the health risks of elevated stress and causes of high stress.  Discuss the correlation between heart/lung disease and anxiety and treatment options. Review healthy ways to manage with stress and anxiety.   Depression: - Provides group verbal and written instruction on the correlation between heart/lung disease and depressed mood, treatment options, and the stigmas associated with seeking treatment.  Anatomy & Physiology of the Heart: - Group verbal and written instruction and models provide basic cardiac anatomy and physiology, with the coronary electrical and arterial systems. Review of Valvular disease and Heart Failure   Cardiac Procedures: - Group verbal and written instruction to review commonly prescribed medications for heart disease. Reviews the medication, class of the drug, and side effects. Includes the steps to properly store meds and maintain the prescription regimen. (beta blockers and nitrates)   Cardiac Medications I: - Group verbal and written instruction to review commonly prescribed medications for heart disease. Reviews the medication, class of the drug, and side effects. Includes the steps to properly  store meds and maintain the prescription regimen.   Cardiac Medications II: -Group verbal and written instruction to review commonly prescribed medications for heart disease. Reviews the medication, class of the drug, and side effects. (all other drug classes)    Go Sex-Intimacy & Heart Disease, Get SMART - Goal Setting: - Group verbal and written instruction through game format to discuss heart disease and the return to sexual intimacy. Provides group verbal and written material to discuss and apply goal setting through the application of the S.M.A.R.T. Method.   Other Matters of the Heart: - Provides group verbal, written materials and models to describe Stable Angina and Peripheral Artery. Includes description of the disease process and treatment options available to the cardiac patient.   Exercise & Equipment Safety: - Individual verbal instruction and demonstration of equipment use and safety with use of the equipment.   Cardiac Rehab from 11/19/2017 in [facility] Cardiac and Pulmonary Rehab  Date  11/19/17  Educator  [facility]  Instruction Review Code  1- Verbalizes Understanding      Infection Prevention: - Provides verbal and written material to individual with discussion of infection control including proper hand washing and proper equipment cleaning during exercise session.   Cardiac Rehab from 11/19/2017 in [facility] Cardiac and Pulmonary Rehab  Date  11/19/17  Educator  [facility]  Instruction Review Code  1- Verbalizes Understanding      Falls Prevention: - Provides verbal and written material to individual with discussion of falls prevention and safety.   Cardiac Rehab from 11/19/2017 in [facility] Cardiac and Pulmonary Rehab  Date  11/19/17  Educator  [facility]  Instruction Review Code  1- Verbalizes Understanding      Diabetes: - Individual verbal and written instruction to review signs/symptoms of diabetes, desired ranges of glucose level fasting, after meals and with exercise. Acknowledge that  pre and post exercise glucose checks will be done for 3 sessions at entry of program.   Cardiac Rehab from 11/19/2017 in [facility] Cardiac and Pulmonary Rehab  Date  11/19/17  Educator  [facility]  Instruction Review Code  1- Verbalizes Understanding      Know Your Numbers and Risk Factors: -Group verbal and written instruction about important numbers in your health.  Discussion of what are risk factors and how they play a role in the disease process.  Review of Cholesterol, Blood Pressure, Diabetes, and BMI and the role they play in your overall health.   Sleep Hygiene: -Provides group verbal and written instruction about how sleep can affect your health.  Define sleep hygiene, discuss sleep cycles and impact of sleep habits. Review good sleep hygiene tips.    Other: -Provides group and verbal instruction on various topics (see comments)   Knowledge Questionnaire Score: Knowledge Questionnaire Score - 11/19/17 1057      Knowledge Questionnaire Score   Pre  Score  19/26   Test reviewed with pt today.  Education Focus: A&P, Angina, Nutrition, Exercise, and Tobacco      Core Components/Risk Factors/Patient Goals at Admission: Personal Goals and Risk Factors at Admission - 11/19/17 1253      Core Components/Risk Factors/Patient Goals on Admission    Weight Management  Yes;Weight Maintenance    Intervention  Weight Management: Develop a combined nutrition and exercise program designed to reach desired caloric intake, while maintaining appropriate intake of nutrient and fiber, sodium and fats, and appropriate energy expenditure required for the weight goal.;Weight Management: Provide education and appropriate resources to help participant work on and attain dietary goals.    Admit Weight  153 lb (69.4 kg)   wants to stay between 150-155 lb   Expected Outcomes  Short Term: Continue to assess and modify interventions until short term weight is achieved;Long Term: Adherence to nutrition and physical  activity/exercise program aimed toward attainment of established weight goal;Weight Maintenance: Understanding of the daily nutrition guidelines, which includes 25-35% calories from fat, 7% or less cal from saturated fats, less than 239m cholesterol, less than 1.5gm of sodium, & 5 or more servings of fruits and vegetables daily;Understanding recommendations for meals to include 15-35% energy as protein, 25-35% energy from fat, 35-60% energy from carbohydrates, less than 2063mof dietary cholesterol, 20-35 gm of total fiber daily;Understanding of distribution of calorie intake throughout the day with the consumption of 4-5 meals/snacks    Diabetes  Yes    Intervention  Provide education about signs/symptoms and action to take for hypo/hyperglycemia.;Provide education about proper nutrition, including hydration, and aerobic/resistive exercise prescription along with prescribed medications to achieve blood glucose in normal ranges: Fasting glucose 65-99 mg/dL    Expected Outcomes  Short Term: Participant verbalizes understanding of the signs/symptoms and immediate care of hyper/hypoglycemia, proper foot care and importance of medication, aerobic/resistive exercise and nutrition plan for blood glucose control.;Long Term: Attainment of HbA1C < 7%.    Hypertension  Yes    Intervention  Provide education on lifestyle modifcations including regular physical activity/exercise, weight management, moderate sodium restriction and increased consumption of fresh fruit, vegetables, and low fat dairy, alcohol moderation, and smoking cessation.;Monitor prescription use compliance.    Expected Outcomes  Short Term: Continued assessment and intervention until BP is < 140/9033mG in hypertensive participants. < 130/67m39m in hypertensive participants with diabetes, heart failure or chronic kidney disease.;Long Term: Maintenance of blood pressure at goal levels.    Lipids  Yes    Intervention  Provide education and support for  participant on nutrition & aerobic/resistive exercise along with prescribed medications to achieve LDL <70mg4mL >40mg.65mExpected Outcomes  Short Term: Participant states understanding of desired cholesterol values and is compliant with medications prescribed. Participant is following exercise prescription and nutrition guidelines.;Long Term: Cholesterol controlled with medications as prescribed, with individualized exercise RX and with personalized nutrition plan. Value goals: LDL < 70mg, 41m> 40 mg.       Core Components/Risk Factors/Patient Goals Review:    Core Components/Risk Factors/Patient Goals at Discharge (Final Review):    ITP Comments: ITP Comments    Row Name 11/19/17 1249 11/21/17 0645         ITP Comments  Med Review completed. Initial ITP created. [facility].  Continue with ITP unless directed changes per Medical Director review.         Comments:

## 2017-11-22 ENCOUNTER — Other Ambulatory Visit: Payer: Self-pay | Admitting: Adult Health

## 2017-11-26 DIAGNOSIS — E1122 Type 2 diabetes mellitus with diabetic chronic kidney disease: Secondary | ICD-10-CM | POA: Diagnosis not present

## 2017-11-26 DIAGNOSIS — I10 Essential (primary) hypertension: Secondary | ICD-10-CM | POA: Diagnosis not present

## 2017-11-26 DIAGNOSIS — E875 Hyperkalemia: Secondary | ICD-10-CM | POA: Diagnosis not present

## 2017-11-26 DIAGNOSIS — R600 Localized edema: Secondary | ICD-10-CM | POA: Diagnosis not present

## 2017-11-26 DIAGNOSIS — N184 Chronic kidney disease, stage 4 (severe): Secondary | ICD-10-CM | POA: Diagnosis not present

## 2017-11-27 ENCOUNTER — Other Ambulatory Visit (INDEPENDENT_AMBULATORY_CARE_PROVIDER_SITE_OTHER): Payer: Medicare Other

## 2017-11-27 ENCOUNTER — Ambulatory Visit (INDEPENDENT_AMBULATORY_CARE_PROVIDER_SITE_OTHER): Payer: Medicare Other | Admitting: Vascular Surgery

## 2017-11-27 ENCOUNTER — Encounter (INDEPENDENT_AMBULATORY_CARE_PROVIDER_SITE_OTHER): Payer: Self-pay | Admitting: Vascular Surgery

## 2017-11-27 ENCOUNTER — Other Ambulatory Visit (INDEPENDENT_AMBULATORY_CARE_PROVIDER_SITE_OTHER): Payer: Self-pay | Admitting: Vascular Surgery

## 2017-11-27 VITALS — BP 145/67 | HR 56 | Resp 16 | Ht 67.0 in | Wt 155.4 lb

## 2017-11-27 DIAGNOSIS — N184 Chronic kidney disease, stage 4 (severe): Secondary | ICD-10-CM

## 2017-11-27 DIAGNOSIS — I10 Essential (primary) hypertension: Secondary | ICD-10-CM

## 2017-11-27 DIAGNOSIS — Z87891 Personal history of nicotine dependence: Secondary | ICD-10-CM

## 2017-11-27 DIAGNOSIS — I129 Hypertensive chronic kidney disease with stage 1 through stage 4 chronic kidney disease, or unspecified chronic kidney disease: Secondary | ICD-10-CM

## 2017-11-27 DIAGNOSIS — E782 Mixed hyperlipidemia: Secondary | ICD-10-CM | POA: Diagnosis not present

## 2017-11-27 DIAGNOSIS — E1122 Type 2 diabetes mellitus with diabetic chronic kidney disease: Secondary | ICD-10-CM

## 2017-11-27 NOTE — Progress Notes (Signed)
Subjective:    Patient ID: Ray Lamb, male    DOB: August 18, 1930, 84 y.o.   MRN: [id number] Chief Complaint  Patient presents with  . Follow-up    ref Candiss Norse for ESRD and vein mapping   Patient presents to review vascular studies.  The patient was last seen on October 04, 2017 when we removed his permacath due to his renal functioning improving.  The patient notes that he sees Dr. Candiss Norse regularly and that his GFR has remained stable.  The patient is currently not on dialysis. The patient does present today to undergo vein mapping in case dialysis would be needed in the future.  The patient denies any issues with his PermCath site.  States that it is healed.  Patient denies any uremic symptoms.  The patient underwent bilateral upper extremity vein mapping which was notable for triphasic waveforms to the bilateral brachial, radial and ulnar arteries.  The patient was noted to have veins too small and amenable for fistula creation.  Patient is right hand dominant.  Patient denies any bilateral lower extremity edema. Patient denies any fever, nausea vomiting.  Review of Systems  Constitutional: Negative.   HENT: Negative.   Eyes: Negative.   Respiratory: Negative.   Cardiovascular: Negative.   Gastrointestinal: Negative.   Endocrine: Negative.   Genitourinary:       Chronic kidney disease  Musculoskeletal: Negative.   Skin: Negative.   Allergic/Immunologic: Negative.   Neurological: Negative.   Hematological: Negative.   Psychiatric/Behavioral: Negative.       Objective:   Physical Exam  Constitutional: He is oriented to person, place, and time. He appears well-developed and well-nourished. No distress.  HENT:  Head: Normocephalic and atraumatic.  Right Ear: External ear normal.  Left Ear: External ear normal.  Eyes: Pupils are equal, round, and reactive to light. Conjunctivae and EOM are normal.  Neck: Normal range of motion.  Cardiovascular: Normal rate, regular rhythm, normal heart  sounds and intact distal pulses.  Pulses:      Radial pulses are 2+ on the right side, and 2+ on the left side.  PermCath site is healed.  No signs of infection.  Pulmonary/Chest: Effort normal and breath sounds normal.  Musculoskeletal: Normal range of motion. He exhibits no edema.  Neurological: He is alert and oriented to person, place, and time.  Skin: Skin is warm and dry. He is not diaphoretic.  Psychiatric: He has a normal mood and affect. His behavior is normal. Judgment and thought content normal.  Vitals reviewed.  BP (!) 145/67 (BP Location: Right Arm)   Pulse (!) 56   Resp 16   Ht 5\' 7"  (1.702 m)   Wt 155 lb 6.4 oz (70.5 kg)   BMI 24.34 kg/m   Past Medical History:  Diagnosis Date  . Anemia   . Chronic kidney disease    STAGE 4  . Coronary atherosclerosis    CABG X 4  . Diabetes mellitus without complication ([place])   . Edema    ankle  . GERD (gastroesophageal reflux disease)   . Glaucoma   . Hearing loss   . Heart murmur   . History of hiatal hernia   . Hyperlipidemia   . Hypertension   . Mini stroke ([place])   . Mitral insufficiency   . Myocardial infarction ([place])    mild, heart cath done no stents needed  . Stroke [facility]) 2011   TIA   Social History   Socioeconomic History  . Marital  status: Married    Spouse name: Not on file  . Number of children: Not on file  . Years of education: Not on file  . Highest education level: Not on file  Occupational History  . Not on file  Social Needs  . Financial resource strain: Not on file  . Food insecurity:    Worry: Not on file    Inability: Not on file  . Transportation needs:    Medical: Not on file    Non-medical: Not on file  Tobacco Use  . Smoking status: Former Smoker    Last attempt to quit: 09/02/1964    Years since quitting: 53.2  . Smokeless tobacco: Never Used  Substance and Sexual Activity  . Alcohol use: No    Alcohol/week: 0.0 standard drinks  . Drug use: No  . Sexual activity: Not on  file  Lifestyle  . Physical activity:    Days per week: Not on file    Minutes per session: Not on file  . Stress: Not on file  Relationships  . Social connections:    Talks on phone: Not on file    Gets together: Not on file    Attends religious service: Not on file    Active member of club or organization: Not on file    Attends meetings of clubs or organizations: Not on file    Relationship status: Not on file  . Intimate partner violence:    Fear of current or ex partner: Not on file    Emotionally abused: Not on file    Physically abused: Not on file    Forced sexual activity: Not on file  Other Topics Concern  . Not on file  Social History Narrative  . Not on file   Past Surgical History:  Procedure Laterality Date  . CATARACT EXTRACTION W/PHACO Left 09/05/2016   Procedure: CATARACT EXTRACTION PHACO AND INTRAOCULAR LENS PLACEMENT (IOC);  Surgeon: Birder Robson, MD;  Location: [facility] ORS;  Service: Ophthalmology;  Laterality: Left;  [place] 00:43.1 AP% 22.6 CDE 9.77 Fluid Pack lot #[id number]  . CATARACT EXTRACTION W/PHACO Right 09/26/2016   Procedure: CATARACT EXTRACTION PHACO AND INTRAOCULAR LENS PLACEMENT ([place]);  Surgeon: Birder Robson, MD;  Location: [facility] ORS;  Service: Ophthalmology;  Laterality: Right;  [place] 01:00 AP% 16.0 CDE 9.71 Fluid pack lot # [id number]  . CORONARY ARTERY BYPASS GRAFT  2003  . DIALYSIS/PERMA CATHETER INSERTION N/A 10/04/2017   Procedure: DIALYSIS/PERMA CATHETER INSERTION;  Surgeon: Algernon Huxley, MD;  Location: [place] [facility];  Service: Cardiovascular;  Laterality: N/A;  . DIALYSIS/PERMA CATHETER REMOVAL N/A 11/01/2017   Procedure: DIALYSIS/PERMA CATHETER REMOVAL;  Surgeon: Algernon Huxley, MD;  Location: [place] [facility];  Service: Cardiovascular;  Laterality: N/A;  . ESOPHAGOGASTRODUODENOSCOPY (EGD) WITH PROPOFOL N/A 04/25/2016   Procedure: ESOPHAGOGASTRODUODENOSCOPY (EGD) WITH PROPOFOL;  Surgeon: Lucilla Lame, MD;  Location: [facility] ENDOSCOPY;   Service: Endoscopy;  Laterality: N/A;  . JOINT REPLACEMENT Right 2010   knee  . LEFT HEART CATH AND CORONARY ANGIOGRAPHY N/A 10/04/2017   Procedure: LEFT HEART CATH AND CORONARY ANGIOGRAPHY;  Surgeon: Isaias Cowman, MD;  Location: [place] [facility];  Service: Cardiovascular;  Laterality: N/A;  . SKIN CANCER EXCISION     Family History  Problem Relation Age of Onset  . Stroke Mother   . Diabetes Mother   . Diabetes Sister   . Diabetes Maternal Grandmother    Allergies  Allergen Reactions  . Bee Venom Hives  . Iodinated Diagnostic  Agents Rash  . Metrizamide Rash  . Other Rash and Other (See Comments)    Dye      Assessment & Plan:  Patient presents to review vascular studies.  The patient was last seen on October 04, 2017 when we removed his permacath due to his renal functioning improving.  The patient notes that he sees Dr. Candiss Norse regularly and that his GFR has remained stable.  The patient is currently not on dialysis. The patient does present today to undergo vein mapping in case dialysis would be needed in the future.  The patient denies any issues with his PermCath site.  States that it is healed.  Patient denies any uremic symptoms.  The patient underwent bilateral upper extremity vein mapping which was notable for triphasic waveforms to the bilateral brachial, radial and ulnar arteries.  The patient was noted to have veins too small and amenable for fistula creation.  Patient is right hand dominant.  Patient denies any bilateral lower extremity edema. Patient denies any fever, nausea vomiting.  1. 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 disease ([place]) - Stable At this time, the patient states that his GFR is stable and he does not require dialysis. The last time we saw the patient was on October 04, 2016 when we removed his PermCath. The patient underwent bilateral upper extremity vein mapping which was notable for veins too small to create a fistula.  The patient is  right-hand dominant.  The patient would be amenable to a left brachial axillary graft creation if he were to need dialysis in the future. The patient is not on dialysis I will not go ahead and schedule him for surgery as the graft which is thrombosed This was explained to the patient and he expresses understanding Patient is to follow-up PRN  2. Mixed hyperlipidemia - Stable Encouraged good control as its slows the progression of atherosclerotic disease  3. Essential hypertension - Stable Encouraged good control as its slows the progression of atherosclerotic and renal disease  Current Outpatient Medications on File Prior to Visit  Medication Sig Dispense Refill  . acetaminophen (TYLENOL) 325 MG tablet Take 650 mg by mouth every 4 (four) hours as needed. for pain/ increased temp. May be administered orally, per G-tube if needed or rectally if unable to swallow (separate order). Maximum dose for 24 hours is 3,000 mg from all sources of Acetaminophen/ Tylenol    . atorvastatin (LIPITOR) 80 MG tablet Take 1 tablet (80 mg total) by mouth at bedtime. 90 tablet 4  . clopidogrel (PLAVIX) 75 MG tablet Take 1 tablet (75 mg total) by mouth daily with breakfast.    . donepezil (ARICEPT) 5 MG tablet Take 5 mg by mouth at bedtime.    Marland Kitchen ezetimibe (ZETIA) 10 MG tablet Take 1 tablet (10 mg total) by mouth daily. 90 tablet 4  . furosemide (LASIX) 40 MG tablet Take 40 mg by mouth daily as needed for fluid.     Marland Kitchen glipiZIDE (GLUCOTROL) 5 MG tablet Take 5 mg by mouth daily before breakfast.    . hydrALAZINE (APRESOLINE) 25 MG tablet     . isosorbide mononitrate (IMDUR) 30 MG 24 hr tablet Take 30 mg by mouth daily.     . Melatonin 3 MG CAPS Take 3 mg by mouth at bedtime.     . metoprolol tartrate (LOPRESSOR) 50 MG tablet Take 1 tablet (50 mg total) by mouth every 12 (twelve) hours.    . Multiple Vitamin (MULTIVITAMIN) tablet Take 1  tablet by mouth daily.    . NON FORMULARY Diet order: Downgrade patient to  dysphagia 2 diet, continue with thin liquids; NO straws, add extra gravy.    . Nutritional Supplements (FEEDING SUPPLEMENT, NEPRO CARB STEADY,) LIQD Take 237 mLs by mouth 3 (three) times daily between meals.  0  . sitaGLIPtin (JANUVIA) 25 MG tablet Take 25 mg by mouth daily.    . timolol (TIMOPTIC) 0.5 % ophthalmic solution Place 1 drop into both eyes daily.     . TURMERIC PO Take 1,300 mg by mouth daily.     Marland Kitchen senna-docusate (SENOKOT-S) 8.6-50 MG tablet Take 1 tablet by mouth at bedtime as needed for mild constipation. (Patient not taking: Reported on 11/19/2017)     No current facility-administered medications on file prior to visit.    There are no Patient Instructions on file for this visit. No follow-ups on file.  Jnya Brossard A Nessie Nong, PA-C

## 2017-11-28 DIAGNOSIS — E119 Type 2 diabetes mellitus without complications: Secondary | ICD-10-CM | POA: Diagnosis not present

## 2017-11-29 ENCOUNTER — Encounter: Payer: Medicare Other | Admitting: *Deleted

## 2017-11-29 DIAGNOSIS — N184 Chronic kidney disease, stage 4 (severe): Secondary | ICD-10-CM | POA: Diagnosis not present

## 2017-11-29 DIAGNOSIS — I214 Non-ST elevation (NSTEMI) myocardial infarction: Secondary | ICD-10-CM | POA: Diagnosis not present

## 2017-11-29 DIAGNOSIS — Z8673 Personal history of transient ischemic attack (TIA), and cerebral infarction without residual deficits: Secondary | ICD-10-CM | POA: Diagnosis not present

## 2017-11-29 DIAGNOSIS — E1122 Type 2 diabetes mellitus with diabetic chronic kidney disease: Secondary | ICD-10-CM | POA: Diagnosis not present

## 2017-11-29 DIAGNOSIS — Z955 Presence of coronary angioplasty implant and graft: Secondary | ICD-10-CM | POA: Diagnosis not present

## 2017-11-29 DIAGNOSIS — I129 Hypertensive chronic kidney disease with stage 1 through stage 4 chronic kidney disease, or unspecified chronic kidney disease: Secondary | ICD-10-CM | POA: Diagnosis not present

## 2017-11-29 LAB — GLUCOSE, CAPILLARY
GLUCOSE-CAPILLARY: 165 mg/dL — AB (ref 70–99)
Glucose-Capillary: 76 mg/dL (ref 70–99)

## 2017-11-29 NOTE — Progress Notes (Signed)
Daily Session Note  Patient Details  Name: Ray Lamb MRN: [id number] Date of Birth: 11-23-1930 Referring Provider:     Cardiac Rehab from 11/19/2017 in [facility] Cardiac and Pulmonary Rehab  Referring Provider  Neoma Laming MD      Encounter Date: 11/29/2017  Check In: Session Check In - 11/29/17 4090      Check-In   Supervising physician immediately available to respond to emergencies  See telemetry face sheet for immediately available ER MD    Location  [facility]-Cardiac & Pulmonary Rehab    Staff Present  Alberteen Sam, MA, RCEP, CCRP, Exercise Physiologist;Joseph Hood RCP,RRT,BSRT;Carroll Enterkin, Therapist, sports, BSN    Medication changes reported      No    Fall or balance concerns reported     No    Warm-up and Cool-down  Performed on first and last piece of equipment    Resistance Training Performed  Yes    VAD Patient?  No    PAD/SET Patient?  No      Pain Assessment   Currently in Pain?  No/denies          Social History   Tobacco Use  Smoking Status Former Smoker  . Last attempt to quit: 09/02/1964  . Years since quitting: 53.2  Smokeless Tobacco Never Used    Goals Met:  Exercise tolerated well Personal goals reviewed No report of cardiac concerns or symptoms Strength training completed today  Goals Unmet:  Not Applicable  Comments: First full day of exercise!  Patient was oriented to gym and equipment including functions, settings, policies, and procedures.  Patient's individual exercise prescription and treatment plan were reviewed.  All starting workloads were established based on the results of the 6 minute walk test done at initial orientation visit.  The plan for exercise progression was also introduced and progression will be customized based on patient's performance and goals.    Dr. Emily Filbert is Medical Director for [place] and [facility].

## 2017-12-03 DIAGNOSIS — I129 Hypertensive chronic kidney disease with stage 1 through stage 4 chronic kidney disease, or unspecified chronic kidney disease: Secondary | ICD-10-CM | POA: Diagnosis not present

## 2017-12-03 DIAGNOSIS — R600 Localized edema: Secondary | ICD-10-CM | POA: Diagnosis not present

## 2017-12-03 DIAGNOSIS — N184 Chronic kidney disease, stage 4 (severe): Secondary | ICD-10-CM | POA: Diagnosis not present

## 2017-12-03 DIAGNOSIS — E1122 Type 2 diabetes mellitus with diabetic chronic kidney disease: Secondary | ICD-10-CM | POA: Diagnosis not present

## 2017-12-04 DIAGNOSIS — Z955 Presence of coronary angioplasty implant and graft: Secondary | ICD-10-CM

## 2017-12-04 DIAGNOSIS — E1122 Type 2 diabetes mellitus with diabetic chronic kidney disease: Secondary | ICD-10-CM | POA: Diagnosis not present

## 2017-12-04 DIAGNOSIS — I214 Non-ST elevation (NSTEMI) myocardial infarction: Secondary | ICD-10-CM

## 2017-12-04 DIAGNOSIS — I129 Hypertensive chronic kidney disease with stage 1 through stage 4 chronic kidney disease, or unspecified chronic kidney disease: Secondary | ICD-10-CM | POA: Diagnosis not present

## 2017-12-04 DIAGNOSIS — Z8673 Personal history of transient ischemic attack (TIA), and cerebral infarction without residual deficits: Secondary | ICD-10-CM | POA: Diagnosis not present

## 2017-12-04 DIAGNOSIS — N184 Chronic kidney disease, stage 4 (severe): Secondary | ICD-10-CM | POA: Diagnosis not present

## 2017-12-04 LAB — GLUCOSE, CAPILLARY
GLUCOSE-CAPILLARY: 157 mg/dL — AB (ref 70–99)
Glucose-Capillary: 83 mg/dL (ref 70–99)

## 2017-12-04 NOTE — Progress Notes (Signed)
Daily Session Note  Patient Details  Name: Ray Lamb MRN: [id number] Date of Birth: [birth year] Referring Provider:     Cardiac Rehab from 11/19/2017 in [facility] Cardiac and Pulmonary Rehab  Referring Provider  Neoma Laming MD      Encounter Date: 12/04/2017  Check In: Session Check In - 12/04/17 0938      Check-In   Supervising physician immediately available to respond to emergencies  See telemetry face sheet for immediately available ER MD    Location  [facility]-Cardiac & Pulmonary Rehab    Staff Present  Heath Lark, RN, BSN, CCRP;Laureen Owens Shark, BS, RRT, Respiratory Lennie Hummer, MA, RCEP, CCRP, Exercise Physiologist    Medication changes reported      No    Fall or balance concerns reported     No    Warm-up and Cool-down  Performed on first and last piece of equipment    Resistance Training Performed  Yes    VAD Patient?  No    PAD/SET Patient?  No      Pain Assessment   Currently in Pain?  No/denies          Social History   Tobacco Use  Smoking Status Former Smoker  . Last attempt to quit: 09/02/1964  . Years since quitting: 53.2  Smokeless Tobacco Never Used    Goals Met:  Independence with exercise equipment Exercise tolerated well No report of cardiac concerns or symptoms Strength training completed today  Goals Unmet:  Not Applicable  Comments: Pt able to follow exercise prescription today without complaint.  Will continue to monitor for progression.   Dr. Emily Filbert is Medical Director for [place] and [facility].

## 2017-12-05 ENCOUNTER — Ambulatory Visit (INDEPENDENT_AMBULATORY_CARE_PROVIDER_SITE_OTHER): Payer: Medicare Other | Admitting: Family Medicine

## 2017-12-05 ENCOUNTER — Encounter: Payer: Self-pay | Admitting: Family Medicine

## 2017-12-05 VITALS — BP 144/60 | HR 54 | Temp 97.6°F | Ht 65.0 in | Wt 155.2 lb

## 2017-12-05 DIAGNOSIS — N184 Chronic kidney disease, stage 4 (severe): Secondary | ICD-10-CM

## 2017-12-05 DIAGNOSIS — I209 Angina pectoris, unspecified: Secondary | ICD-10-CM | POA: Diagnosis not present

## 2017-12-05 DIAGNOSIS — I129 Hypertensive chronic kidney disease with stage 1 through stage 4 chronic kidney disease, or unspecified chronic kidney disease: Secondary | ICD-10-CM

## 2017-12-05 DIAGNOSIS — Z23 Encounter for immunization: Secondary | ICD-10-CM | POA: Diagnosis not present

## 2017-12-05 DIAGNOSIS — E1122 Type 2 diabetes mellitus with diabetic chronic kidney disease: Secondary | ICD-10-CM

## 2017-12-05 LAB — BAYER DCA HB A1C WAIVED: HB A1C (BAYER DCA - WAIVED): 6.5 % (ref <7.0)

## 2017-12-05 NOTE — Assessment & Plan Note (Signed)
Recovered nicely from dialysis GFR now back to 30.

## 2017-12-05 NOTE — Assessment & Plan Note (Signed)
The current medical regimen is effective;  continue present plan and medications.  

## 2017-12-05 NOTE — Patient Instructions (Signed)

## 2017-12-05 NOTE — Progress Notes (Signed)
BP (!) 144/60 (BP Location: Left Arm, Patient Position: Sitting, Cuff Size: Normal)   Pulse (!) 54   Temp 97.6 F (36.4 C) (Oral)   Ht 5\' 5"  (1.651 m)   Wt 155 lb 3.2 oz (70.4 kg)   SpO2 98%   BMI 25.83 kg/m    Subjective:    Patient ID: Ray Lamb, male    DOB: May 23, 1930, 84 y.o.   MRN: [id number]  HPI: Ray Lamb is a 84 y.o. male  Chief Complaint  Patient presents with  . Diabetes  Patient follow-up some confusion over check and hemoglobin A1c.  Patient has requested prior to visit a repeat A1c and reviewed which is normal A1c's today.  Reviewed with patient hemoglobin A1c's are to be checked at 90-day intervals are a little longer but no shorter.  Patient indicates she understands we will not do a hemoglobin A1c again until late January early February. Discussed blood pressure is doing good patient has some occasional readings in the 120s at heart track. Discussed renal function which is improved dramatically after dialysis during his heart attack.  Patient's renal function from nephrology showing GFR of 30. Stable cardiac function followed by cardiology.  Relevant past medical, surgical, family and social history reviewed and updated as indicated. Interim medical history since our last visit reviewed. Allergies and medications reviewed and updated.  Review of Systems  Constitutional: Negative.   Respiratory: Negative.   Cardiovascular: Negative.     Per HPI unless specifically indicated above     Objective:    BP (!) 144/60 (BP Location: Left Arm, Patient Position: Sitting, Cuff Size: Normal)   Pulse (!) 54   Temp 97.6 F (36.4 C) (Oral)   Ht 5\' 5"  (1.651 m)   Wt 155 lb 3.2 oz (70.4 kg)   SpO2 98%   BMI 25.83 kg/m   Wt Readings from Last 3 Encounters:  12/05/17 155 lb 3.2 oz (70.4 kg)  11/27/17 155 lb 6.4 oz (70.5 kg)  11/19/17 153 lb 8 oz (69.6 kg)    Physical Exam  Constitutional: He is oriented to person, place, and time. He appears well-developed  and well-nourished.  HENT:  Head: Normocephalic and atraumatic.  Eyes: Conjunctivae and EOM are normal.  Neck: Normal range of motion.  Cardiovascular: Normal rate, regular rhythm and normal heart sounds.  Pulmonary/Chest: Effort normal and breath sounds normal.  Musculoskeletal: Normal range of motion.  Neurological: He is alert and oriented to person, place, and time.  Skin: No erythema.  Psychiatric: He has a normal mood and affect. His behavior is normal. Judgment and thought content normal.     Results for orders placed or performed in visit on 12/04/17  Glucose, capillary  Result Value Ref Range   Glucose-Capillary 83 70 - 99 mg/dL      Assessment & Plan:   Problem List Items Addressed This Visit      Cardiovascular and Mediastinum   Benign hypertension with CKD (chronic kidney disease) stage IV (HCC)    The current medical regimen is effective;  continue present plan and medications.       Angina pectoris ([place])    The current medical regimen is effective;  continue present plan and medications.         Endocrine   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 disease ([place]) - Primary    Recovered nicely from dialysis GFR now back to 30.      Relevant Orders   [facility]  Hb A1c Waived    Other Visit Diagnoses    Need for influenza vaccination       Relevant Orders   Flu vaccine HIGH DOSE PF (Fluzone High dose) (Completed)       Follow up plan: Return in about 3 months (around 03/07/2018) for Hemoglobin A1c, Physical Exam.

## 2017-12-06 DIAGNOSIS — I129 Hypertensive chronic kidney disease with stage 1 through stage 4 chronic kidney disease, or unspecified chronic kidney disease: Secondary | ICD-10-CM | POA: Diagnosis not present

## 2017-12-06 DIAGNOSIS — I214 Non-ST elevation (NSTEMI) myocardial infarction: Secondary | ICD-10-CM

## 2017-12-06 DIAGNOSIS — E1122 Type 2 diabetes mellitus with diabetic chronic kidney disease: Secondary | ICD-10-CM | POA: Diagnosis not present

## 2017-12-06 DIAGNOSIS — Z955 Presence of coronary angioplasty implant and graft: Secondary | ICD-10-CM

## 2017-12-06 DIAGNOSIS — Z8673 Personal history of transient ischemic attack (TIA), and cerebral infarction without residual deficits: Secondary | ICD-10-CM | POA: Diagnosis not present

## 2017-12-06 DIAGNOSIS — N184 Chronic kidney disease, stage 4 (severe): Secondary | ICD-10-CM | POA: Diagnosis not present

## 2017-12-06 LAB — GLUCOSE, CAPILLARY
GLUCOSE-CAPILLARY: 154 mg/dL — AB (ref 70–99)
Glucose-Capillary: 88 mg/dL (ref 70–99)

## 2017-12-06 NOTE — Progress Notes (Signed)
Daily Session Note  Patient Details  Name: Ray Lamb MRN: [id number] Date of Birth: 1930-12-24 Referring Provider:     Cardiac Rehab from 11/19/2017 in [facility] Cardiac and Pulmonary Rehab  Referring Provider  Neoma Laming MD      Encounter Date: 12/06/2017  Check In: Session Check In - 12/06/17 0927      Check-In   Supervising physician immediately available to respond to emergencies  See telemetry face sheet for immediately available ER MD    Location  [facility]-Cardiac & Pulmonary Rehab    Staff Present  Carson Myrtle, BS, RRT, Respiratory Therapist;Carroll Enterkin, RN, BSN;Jessica Luan Pulling, MA, RCEP, CCRP, Exercise Physiologist    Medication changes reported      No    Fall or balance concerns reported     No    Warm-up and Cool-down  Performed on first and last piece of equipment    Resistance Training Performed  Yes    VAD Patient?  No    PAD/SET Patient?  No      Pain Assessment   Currently in Pain?  No/denies          Social History   Tobacco Use  Smoking Status Former Smoker  . Last attempt to quit: 09/02/1964  . Years since quitting: 53.2  Smokeless Tobacco Never Used    Goals Met:  Independence with exercise equipment Exercise tolerated well No report of cardiac concerns or symptoms Strength training completed today  Goals Unmet:  Not Applicable  Comments: Pt able to follow exercise prescription today without complaint.  Will continue to monitor for progression.   Dr. Emily Filbert is Medical Director for [place] and [facility].

## 2017-12-13 ENCOUNTER — Ambulatory Visit: Payer: Medicare Other

## 2017-12-13 ENCOUNTER — Encounter: Payer: Medicare Other | Admitting: *Deleted

## 2017-12-13 DIAGNOSIS — E1122 Type 2 diabetes mellitus with diabetic chronic kidney disease: Secondary | ICD-10-CM | POA: Diagnosis not present

## 2017-12-13 DIAGNOSIS — N184 Chronic kidney disease, stage 4 (severe): Secondary | ICD-10-CM | POA: Diagnosis not present

## 2017-12-13 DIAGNOSIS — I214 Non-ST elevation (NSTEMI) myocardial infarction: Secondary | ICD-10-CM | POA: Diagnosis not present

## 2017-12-13 DIAGNOSIS — I129 Hypertensive chronic kidney disease with stage 1 through stage 4 chronic kidney disease, or unspecified chronic kidney disease: Secondary | ICD-10-CM | POA: Diagnosis not present

## 2017-12-13 DIAGNOSIS — Z955 Presence of coronary angioplasty implant and graft: Secondary | ICD-10-CM

## 2017-12-13 DIAGNOSIS — Z8673 Personal history of transient ischemic attack (TIA), and cerebral infarction without residual deficits: Secondary | ICD-10-CM | POA: Diagnosis not present

## 2017-12-13 NOTE — Progress Notes (Signed)
Daily Session Note  Patient Details  Name: Ray Lamb MRN: [id number] Date of Birth: 12/21/1930 Referring Provider:     Cardiac Rehab from 11/19/2017 in [facility] Cardiac and Pulmonary Rehab  Referring Provider  Khan, Shaukat MD      Encounter Date: 12/13/2017  Check In: Session Check In - 12/13/17 0930      Check-In   Supervising physician immediately available to respond to emergencies  See telemetry face sheet for immediately available ER MD    Location  [facility]-Cardiac & Pulmonary Rehab    Staff Present  Mary Jo Abernethy, RN, BSN, MA; , MA, RCEP, CCRP, Exercise Physiologist;Joseph Hood RCP,RRT,BSRT    Medication changes reported      No    Fall or balance concerns reported     No    Warm-up and Cool-down  Performed on first and last piece of equipment    Resistance Training Performed  Yes    VAD Patient?  No    PAD/SET Patient?  No      Pain Assessment   Currently in Pain?  No/denies          Social History   Tobacco Use  Smoking Status Former Smoker  . Last attempt to quit: 09/02/1964  . Years since quitting: 53.3  Smokeless Tobacco Never Used    Goals Met:  Independence with exercise equipment Exercise tolerated well No report of cardiac concerns or symptoms Strength training completed today  Goals Unmet:  Not Applicable  Comments: Pt able to follow exercise prescription today without complaint.  Will continue to monitor for progression. Reviewed home exercise with pt today.  Pt plans to exercise at [facility] using the treadmill, recumbent bike, NuStep and hand weights for exercise.  Reviewed THR, pulse, RPE, sign and symptoms, NTG use, and when to call 911 or MD.  Also discussed weather considerations and indoor options.  Pt voiced understanding.    Dr. Mark Miller is Medical Director for [facility] and [facility]. 

## 2017-12-17 ENCOUNTER — Encounter: Payer: Medicare Other | Attending: Cardiology

## 2017-12-17 DIAGNOSIS — Z7984 Long term (current) use of oral hypoglycemic drugs: Secondary | ICD-10-CM | POA: Insufficient documentation

## 2017-12-17 DIAGNOSIS — K219 Gastro-esophageal reflux disease without esophagitis: Secondary | ICD-10-CM | POA: Diagnosis not present

## 2017-12-17 DIAGNOSIS — I252 Old myocardial infarction: Secondary | ICD-10-CM | POA: Insufficient documentation

## 2017-12-17 DIAGNOSIS — I214 Non-ST elevation (NSTEMI) myocardial infarction: Secondary | ICD-10-CM | POA: Insufficient documentation

## 2017-12-17 DIAGNOSIS — I129 Hypertensive chronic kidney disease with stage 1 through stage 4 chronic kidney disease, or unspecified chronic kidney disease: Secondary | ICD-10-CM | POA: Insufficient documentation

## 2017-12-17 DIAGNOSIS — Z8673 Personal history of transient ischemic attack (TIA), and cerebral infarction without residual deficits: Secondary | ICD-10-CM | POA: Diagnosis not present

## 2017-12-17 DIAGNOSIS — I2510 Atherosclerotic heart disease of native coronary artery without angina pectoris: Secondary | ICD-10-CM | POA: Insufficient documentation

## 2017-12-17 DIAGNOSIS — E1122 Type 2 diabetes mellitus with diabetic chronic kidney disease: Secondary | ICD-10-CM | POA: Diagnosis not present

## 2017-12-17 DIAGNOSIS — N184 Chronic kidney disease, stage 4 (severe): Secondary | ICD-10-CM | POA: Diagnosis not present

## 2017-12-17 DIAGNOSIS — Z7902 Long term (current) use of antithrombotics/antiplatelets: Secondary | ICD-10-CM | POA: Diagnosis not present

## 2017-12-17 DIAGNOSIS — Z79899 Other long term (current) drug therapy: Secondary | ICD-10-CM | POA: Insufficient documentation

## 2017-12-17 DIAGNOSIS — E785 Hyperlipidemia, unspecified: Secondary | ICD-10-CM | POA: Insufficient documentation

## 2017-12-17 DIAGNOSIS — Z955 Presence of coronary angioplasty implant and graft: Secondary | ICD-10-CM | POA: Diagnosis not present

## 2017-12-17 DIAGNOSIS — Z87891 Personal history of nicotine dependence: Secondary | ICD-10-CM | POA: Insufficient documentation

## 2017-12-17 NOTE — Progress Notes (Signed)
Daily Session Note  Patient Details  Name: Ray Lamb MRN: [id number] Date of Birth: 1930/04/23 Referring Provider:     Cardiac Rehab from 11/19/2017 in [facility]  Referring Provider  Neoma Laming MD      Encounter Date: 12/17/2017  Check In: Session Check In - 12/17/17 1743      Check-In   Supervising physician immediately available to respond to emergencies  See telemetry face sheet for immediately available ER MD    Location  [facility]-Cardiac & Pulmonary Rehab    Staff Present  Nyoka Cowden, RN, BSN, MA;Susanne Bice, RN, BSN, Laveda Norman, BS, ACSM CEP, Exercise Physiologist;Other    Medication changes reported      No    Fall or balance concerns reported     No    Warm-up and Cool-down  Performed on first and last piece of equipment    Resistance Training Performed  Yes    VAD Patient?  No    PAD/SET Patient?  No      Pain Assessment   Currently in Pain?  No/denies    Multiple Pain Sites  No          Social History   Tobacco Use  Smoking Status Former Smoker  . Last attempt to quit: 09/02/1964  . Years since quitting: 53.3  Smokeless Tobacco Never Used    Goals Met:  Independence with exercise equipment Exercise tolerated well No report of cardiac concerns or symptoms Strength training completed today  Goals Unmet:  Not Applicable  Comments: Pt able to follow exercise prescription today without complaint.  Will continue to monitor for progression.   Dr. Emily Filbert is Medical Director for [facility].

## 2017-12-18 ENCOUNTER — Encounter: Payer: Medicare Other | Admitting: *Deleted

## 2017-12-18 DIAGNOSIS — Z955 Presence of coronary angioplasty implant and graft: Secondary | ICD-10-CM | POA: Diagnosis not present

## 2017-12-18 DIAGNOSIS — Z8673 Personal history of transient ischemic attack (TIA), and cerebral infarction without residual deficits: Secondary | ICD-10-CM | POA: Diagnosis not present

## 2017-12-18 DIAGNOSIS — I214 Non-ST elevation (NSTEMI) myocardial infarction: Secondary | ICD-10-CM | POA: Diagnosis not present

## 2017-12-18 DIAGNOSIS — N184 Chronic kidney disease, stage 4 (severe): Secondary | ICD-10-CM | POA: Diagnosis not present

## 2017-12-18 DIAGNOSIS — I129 Hypertensive chronic kidney disease with stage 1 through stage 4 chronic kidney disease, or unspecified chronic kidney disease: Secondary | ICD-10-CM | POA: Diagnosis not present

## 2017-12-18 DIAGNOSIS — E1122 Type 2 diabetes mellitus with diabetic chronic kidney disease: Secondary | ICD-10-CM | POA: Diagnosis not present

## 2017-12-18 NOTE — Progress Notes (Signed)
Daily Session Note  Patient Details  Name: Ray Lamb MRN: [id number] Date of Birth: 11-Sep-1930 Referring Provider:     Cardiac Rehab from 11/19/2017 in [facility] Cardiac and Pulmonary Rehab  Referring Provider  Neoma Laming MD      Encounter Date: 12/18/2017  Check In: Session Check In - 12/18/17 0923      Check-In   Supervising physician immediately available to respond to emergencies  See telemetry face sheet for immediately available ER MD    Location  [facility]-Cardiac & Pul[facility] Re[facility]    Staff Present  Heath Lark, RN, BSN, CCRP;Other;Hatsue Sime [place], MA, RCEP, CCRP, Exercise Physiologist;Amanda Oletta Darter, BA, ACSM CEP, Exercise Physiologist   Pryor Montes Durrell   Medication changes reported      No    Fall or balance concerns reported     No    Warm-up and Cool-down  Performed on first and last piece of equipment    Resistance Training Performed  Yes    VAD Patient?  No    PAD/SET Patient?  No      Pain Assessment   Currently in Pain?  No/denies          Social History   Tobacco Use  Smoking Status Former Smoker  . Last attempt to quit: 09/02/1964  . Years since quitting: 53.3  Smokeless Tobacco Never Used    Goals Met:  Independence with exercise equipment Exercise tolerated well No report of cardiac concerns or symptoms Strength training completed today  Goals Unmet:  Not Applicable  Comments: Pt able to follow exercise prescription today without complaint.  Will continue to monitor for progression.    Dr. Emily Filbert is Medical Director for [facility].

## 2017-12-19 ENCOUNTER — Encounter: Payer: Self-pay | Admitting: *Deleted

## 2017-12-19 DIAGNOSIS — I214 Non-ST elevation (NSTEMI) myocardial infarction: Secondary | ICD-10-CM

## 2017-12-19 DIAGNOSIS — Z955 Presence of coronary angioplasty implant and graft: Secondary | ICD-10-CM

## 2017-12-19 NOTE — Progress Notes (Signed)
Cardiac Individual Treatment Plan  Patient Details  Name: Ray Lamb MRN: [id number] Date of Birth: [birth year] Referring Provider:     Cardiac Rehab from 11/19/2017 in [facility] Cardiac and Pulmonary Rehab  Referring Provider  Neoma Laming MD      Initial Encounter Date:    Cardiac Rehab from 11/19/2017 in [facility] Cardiac and Pulmonary Rehab  Date  11/19/17      Visit Diagnosis: NSTEMI (non-ST elevated myocardial infarction) [facility])  Status post coronary artery stent placement  Patient's Home Medications on Admission:  Current Outpatient Medications:  .  atorvastatin (LIPITOR) 80 MG tablet, Take 1 tablet (80 mg total) by mouth at bedtime., Disp: 90 tablet, Rfl: 4 .  clopidogrel (PLAVIX) 75 MG tablet, Take 1 tablet (75 mg total) by mouth daily with breakfast., Disp: , Rfl:  .  donepezil (ARICEPT) 5 MG tablet, Take 5 mg by mouth at bedtime., Disp: , Rfl:  .  ezetimibe (ZETIA) 10 MG tablet, Take 1 tablet (10 mg total) by mouth daily., Disp: 90 tablet, Rfl: 4 .  furosemide (LASIX) 40 MG tablet, Take 40 mg by mouth daily as needed for fluid. , Disp: , Rfl:  .  glipiZIDE (GLUCOTROL) 5 MG tablet, Take 5 mg by mouth daily before breakfast., Disp: , Rfl:  .  hydrALAZINE (APRESOLINE) 25 MG tablet, , Disp: , Rfl:  .  isosorbide mononitrate (IMDUR) 30 MG 24 hr tablet, Take 30 mg by mouth daily. , Disp: , Rfl:  .  Melatonin 3 MG CAPS, Take 3 mg by mouth at bedtime. , Disp: , Rfl:  .  metoprolol tartrate (LOPRESSOR) 50 MG tablet, Take 1 tablet (50 mg total) by mouth every 12 (twelve) hours., Disp: , Rfl:  .  Multiple Vitamin (MULTIVITAMIN) tablet, Take 1 tablet by mouth daily., Disp: , Rfl:  .  NON FORMULARY, Diet order: Downgrade patient to dysphagia 2 diet, continue with thin liquids; NO straws, add extra gravy., Disp: , Rfl:  .  Nutritional Supplements (FEEDING SUPPLEMENT, NEPRO CARB STEADY,) LIQD, Take 237 mLs by mouth 3 (three) times daily between meals., Disp: , Rfl: 0 .  sitaGLIPtin (JANUVIA) 25  MG tablet, Take 25 mg by mouth daily., Disp: , Rfl:  .  timolol (TIMOPTIC) 0.5 % ophthalmic solution, Place 1 drop into both eyes daily. , Disp: , Rfl:  .  TURMERIC PO, Take 1,300 mg by mouth daily. , Disp: , Rfl:   Past Medical History: Past Medical History:  Diagnosis Date  . Anemia   . Chronic kidney disease    STAGE 4  . Coronary atherosclerosis    CABG X 4  . Diabetes mellitus without complication ([place])   . Edema    ankle  . GERD (gastroesophageal reflux disease)   . Glaucoma   . Hearing loss   . Heart murmur   . History of hiatal hernia   . Hyperlipidemia   . Hypertension   . Mini stroke ([place])   . Mitral insufficiency   . Myocardial infarction ([place])    mild, heart cath done no stents needed  . Stroke [facility]) 2011   TIA    Tobacco Use: Social History   Tobacco Use  Smoking Status Former Smoker  . Last attempt to quit: 09/02/1964  . Years since quitting: 53.3  Smokeless Tobacco Never Used    Labs: Recent Chemical engineer    Labs for ITP Cardiac and Pulmonary Rehab Latest Ref Rng & Units 09/04/2017 09/30/2017 10/09/2017 10/10/2017 11/06/2017   Cholestrol <200  mg/dL 106 - - - -   LDLCALC 0 - 99 mg/dL - - - - -   HDL >39 mg/dL - - - - -   Trlycerides <150 mg/dL 64 - - - -   Hemoglobin A1c 4.8 - 5.6 % - - - - 6.8(H)   PHART 7.350 - 7.450 - 7.20(L) 7.47(H) 7.48(H) -   PCO2ART 32.0 - 48.0 mmHg - 37 45 45 -   HCO3 20.0 - 28.0 mmol/L - 14.5(L) 32.8(H) 33.5(H) -   ACIDBASEDEF 0.0 - 2.0 mmol/L - 12.6(H) - - -   O2SAT % - 91.0 91.1 97.9 -       Exercise Target Goals: Exercise Program Goal: Individual exercise prescription set using results from initial 6 min walk test and THRR while considering  patient's activity barriers and safety.   Exercise Prescription Goal: Initial exercise prescription builds to 30-45 minutes a day of aerobic activity, 2-3 days per week.  Home exercise guidelines will be given to patient during program as part of exercise prescription that  the participant will acknowledge.  Activity Barriers & Risk Stratification: Activity Barriers & Cardiac Risk Stratification - 11/19/17 1220      Activity Barriers & Cardiac Risk Stratification   Activity Barriers  Decreased Ventricular Function;Deconditioning;Muscular Weakness;Balance Concerns;History of Falls;Assistive Device    Cardiac Risk Stratification  High       6 Minute Walk: 6 Minute Walk    Row Name 11/19/17 1219         6 Minute Walk   Phase  Initial     Distance  585 feet     Walk Time  6 minutes     # of Rest Breaks  0     MPH  1.11     METS  0.95     RPE  12     VO2 Peak  3.31     Symptoms  No     Resting HR  63 bpm     Resting BP  122/64     Resting Oxygen Saturation   100 %     Exercise Oxygen Saturation  during 6 min walk  100 %     Max Ex. HR  100 bpm     Max Ex. BP  134/76     2 Minute Post BP  124/64        Oxygen Initial Assessment:   Oxygen Re-Evaluation:   Oxygen Discharge (Final Oxygen Re-Evaluation):   Initial Exercise Prescription: Initial Exercise Prescription - 11/19/17 1200      Date of Initial Exercise RX and Referring Provider   Date  11/19/17    Referring Provider  Neoma Laming MD      Treadmill   MPH  0.8    Grade  0.5    Minutes  15    METs  1.7      Recumbant Bike   Level  1    RPM  50    Watts  10    Minutes  15    METs  1.7      NuStep   Level  1    SPM  80    Minutes  15    METs  1.7      Prescription Details   Frequency (times per week)  2    Duration  Progress to 30 minutes of continuous aerobic without signs/symptoms of physical distress      Intensity   THRR 40-80% of Max Heartrate  92-121    Ratings of Perceived Exertion  11-13    Perceived Dyspnea  0-4      Progression   Progression  Continue to progress workloads to maintain intensity without signs/symptoms of physical distress.      Resistance Training   Training Prescription  Yes    Weight  3 lbs    Reps  10-15       Perform  Capillary Blood Glucose checks as needed.  Exercise Prescription Changes: Exercise Prescription Changes    Row Name 11/19/17 1200 12/13/17 1000           Response to Exercise   Blood Pressure (Admit)  122/64  -      Blood Pressure (Exercise)  134/76  -      Blood Pressure (Exit)  124/64  -      Heart Rate (Admit)  63 bpm  -      Heart Rate (Exercise)  100 bpm  -      Heart Rate (Exit)  63 bpm  -      Oxygen Saturation (Admit)  100 %  -      Oxygen Saturation (Exercise)  100 %  -      Rating of Perceived Exertion (Exercise)  12  -      Symptoms  none  -      Comments  walk test results  -        Home Exercise Plan   Plans to continue exercise at  [facility] (comment) treadmill, recumbent bike, and Nustep at the [employer]  -  Add 2 additional days to program exercise sessions.      Initial Home Exercises Provided  -  12/13/17         Exercise Comments: Exercise Comments    Row Name 11/29/17 0926           Exercise Comments  First full day of exercise!  Patient was oriented to gym and equipment including functions, settings, policies, and procedures.  Patient's individual exercise prescription and treatment plan were reviewed.  All starting workloads were established based on the results of the 6 minute walk test done at initial orientation visit.  The plan for exercise progression was also introduced and progression will be customized based on patient's performance and goals.          Exercise Goals and Review: Exercise Goals    Row Name 11/19/17 1226             Exercise Goals   Increase Physical Activity  Yes       Intervention  Provide advice, education, support and counseling about physical activity/exercise needs.;Develop an individualized exercise prescription for aerobic and resistive training based on initial evaluation findings, risk stratification, comorbidities and participant's personal goals.       Expected Outcomes  Short Term:  Attend rehab on a regular basis to increase amount of physical activity.;Long Term: Add in home exercise to make exercise part of routine and to increase amount of physical activity.;Long Term: Exercising regularly at least 3-5 days a week.       Increase Strength and Stamina  Yes       Intervention  Provide advice, education, support and counseling about physical activity/exercise needs.;Develop an individualized exercise prescription for aerobic and resistive training based on initial evaluation findings, risk stratification, comorbidities and participant's personal goals.       Expected Outcomes  Short Term:  Increase workloads from initial exercise prescription for resistance, speed, and METs.;Short Term: Perform resistance training exercises routinely during rehab and add in resistance training at home;Long Term: Improve cardiorespiratory fitness, muscular endurance and strength as measured by increased METs and functional capacity (6MWT)       Able to understand and use rate of perceived exertion (RPE) scale  Yes       Intervention  Provide education and explanation on how to use RPE scale       Expected Outcomes  Short Term: Able to use RPE daily in rehab to express subjective intensity level;Long Term:  Able to use RPE to guide intensity level when exercising independently       Knowledge and understanding of Target Heart Rate Range (THRR)  Yes       Intervention  Provide education and explanation of THRR including how the numbers were predicted and where they are located for reference       Expected Outcomes  Short Term: Able to state/look up THRR;Short Term: Able to use daily as guideline for intensity in rehab;Long Term: Able to use THRR to govern intensity when exercising independently       Able to check pulse independently  Yes       Intervention  Provide education and demonstration on how to check pulse in carotid and radial arteries.;Review the importance of being able to check your own  pulse for safety during independent exercise       Expected Outcomes  Short Term: Able to explain why pulse checking is important during independent exercise;Long Term: Able to check pulse independently and accurately       Understanding of Exercise Prescription  Yes       Intervention  Provide education, explanation, and written materials on patient's individual exercise prescription       Expected Outcomes  Short Term: Able to explain program exercise prescription;Long Term: Able to explain home exercise prescription to exercise independently          Exercise Goals Re-Evaluation : Exercise Goals Re-Evaluation    Row Name 11/29/17 0926 12/11/17 1546 12/13/17 1010         Exercise Goal Re-Evaluation   Exercise Goals Review  Increase Physical Activity;Increase Strength and Stamina;Able to understand and use rate of perceived exertion (RPE) scale;Knowledge and understanding of Target Heart Rate Range (THRR);Understanding of Exercise Prescription  Increase Physical Activity;Increase Strength and Stamina;Understanding of Exercise Prescription  Increase Physical Activity;Able to understand and use rate of perceived exertion (RPE) scale;Knowledge and understanding of Target Heart Rate Range (THRR);Increase Strength and Stamina;Able to check pulse independently;Understanding of Exercise Prescription     Comments  Reviewed RPE scale, THR and program prescription with pt today.  Pt voiced understanding and was given a copy of goals to take home.  Mckinley is off to a good start in rehab.  He is already up to level 2 on the recumbent bike.  We will continue monitor his progression.   Reviewed home exercise with pt today.  Pt plans to exercise at [facility] using the treadmill, recumbent bike, NuStep and hand weights for exercise.  Reviewed THR, pulse, RPE, sign and symptoms, NTG use, and when to call 911 or MD.  Also discussed weather considerations and indoor options.  Pt voiced understanding.     Expected  Outcomes  Short: Use RPE daily to regulate intensity. Long: Follow program prescription in THR.  Short: Review home exercise guidelines  Long: Continue increase strength and stamina  Short: Isaul is going to add 1-2 days and eventually 3 days of exercise at [employer] using the treadmill, recumbent bike, Nustep, and hand weights. Long: Continue exercising outside of class and walking 30 minutes on the treadmill without stopping.         Discharge Exercise Prescription (Final Exercise Prescription Changes): Exercise Prescription Changes - 12/13/17 1000      Home Exercise Plan   Plans to continue exercise at  [facility] (comment)   treadmill, recumbent bike, and Nustep at the [place]   Frequency  Add 2 additional days to program exercise sessions.    Initial Home Exercises Provided  12/13/17       Nutrition:  Target Goals: Understanding of nutrition guidelines, daily intake of sodium <1573m, cholesterol <2076m calories 30% from fat and 7% or less from saturated fats, daily to have 5 or more servings of fruits and vegetables.  Biometrics: Pre Biometrics - 11/19/17 1227      Pre Biometrics   Height  5' 5.3" (1.659 m)    Weight  153 lb 8 oz (69.6 kg)    Waist Circumference  35 inches    Hip Circumference  39 inches    Waist to Hip Ratio  0.9 %    BMI (Calculated)  25.3    Single Leg Stand  1.58 seconds        Nutrition Therapy Plan and Nutrition Goals: Nutrition Therapy & Goals - 11/29/17 1249      Nutrition Therapy   Diet  DM    Drug/Food Interactions  Statins/Certain Fruits    Protein (specify units)  10-11oz    Fiber  30 grams    Whole Grain Foods  3 servings   chooses multigrain low sodium bread   Saturated Fats  13 max. grams    Fruits and Vegetables  5 servings/day   8 ideal; eats fruits and vegetables regularly but eats only small portions   Sodium  1500 grams      Personal Nutrition Goals   Nutrition Goal  Eat breakfast an hour later on the mornings you are  coming to class to exercise. This will help prevent episodes of low blood sugar intra/post exertion    Personal Goal #2  Limit foods high in sodium and potassium. Use the packet provided as a guide    Personal Goal #3  When eating out, ask for gravies and sauces on the side, order non-fried items, and/or ask for no extra salt to be added to meals    Comments  He and his wife have been trying to minimize sodium intake, but they eat out frequently. They choose low sodium breakfast meats, bread, and canned items for meals at home. Typically eats breakfast and dinner with a light lunch or mid afternoon snack. He is regaining his appetite s/p hospital and rehab stay but still eats small portions. His wife is trying to cook with salt-free herbs and spices but needs further instruction.      Intervention Plan   Intervention  Prescribe, educate and counsel regarding individualized specific dietary modifications aiming towards targeted core components such as weight, hypertension, lipid management, diabetes, heart failure and other comorbidities.;Nutrition handout(s) given to patient.   Salt free seasoning blends handout; DM type II and CKD nutrition therapy packet   Expected Outcomes  Short Term Goal: Understand basic principles of dietary content, such as calories, fat, sodium, cholesterol and nutrients.;Short Term Goal: A plan has been developed with personal nutrition goals set during  dietitian appointment.;Long Term Goal: Adherence to prescribed nutrition plan.       Nutrition Assessments: Nutrition Assessments - 11/19/17 1258      MEDFICTS Scores   Pre Score  29       Nutrition Goals Re-Evaluation: Nutrition Goals Re-Evaluation    [place] Name 11/29/17 1255             Goals   Nutrition Goal  When eating out, ask for gravies and sauces on the side, order non-fried items, and/or ask for no extra salt to be added to meals       Comment  He and his wife eat out frequently, as they enjoy this time  together out of the house. He has been trying to watch his sodium intake however and knows that it is often difficult to monitor sodium when eating out. They choose restaurants over fast food       Expected Outcome  He will make adjustments to his food orders in order to help decrease sodium content of meals when eating out         Personal Goal #2 Re-Evaluation   Personal Goal #2  Eat breakfast an hour later on mornings you are coming to class to exercise. This will help prevent episodes of low blood sugar intra/post exertion         Personal Goal #3 Re-Evaluation   Personal Goal #3  Limit foods high in sodium and potassium. Use the packet provided as a guide          Nutrition Goals Discharge (Final Nutrition Goals Re-Evaluation): Nutrition Goals Re-Evaluation - 11/29/17 1255      Goals   Nutrition Goal  When eating out, ask for gravies and sauces on the side, order non-fried items, and/or ask for no extra salt to be added to meals    Comment  He and his wife eat out frequently, as they enjoy this time together out of the house. He has been trying to watch his sodium intake however and knows that it is often difficult to monitor sodium when eating out. They choose restaurants over fast food    Expected Outcome  He will make adjustments to his food orders in order to help decrease sodium content of meals when eating out      Personal Goal #2 Re-Evaluation   Personal Goal #2  Eat breakfast an hour later on mornings you are coming to class to exercise. This will help prevent episodes of low blood sugar intra/post exertion      Personal Goal #3 Re-Evaluation   Personal Goal #3  Limit foods high in sodium and potassium. Use the packet provided as a guide       Psychosocial: Target Goals: Acknowledge presence or absence of significant depression and/or stress, maximize coping skills, provide positive support system. Participant is able to verbalize types and ability to use techniques and  skills needed for reducing stress and depression.   Initial Review & Psychosocial Screening: Initial Psych Review & Screening - 11/19/17 1254      Initial Review   Current issues with  Current Sleep Concerns;Current Stress Concerns   Claris reported sleeping decent before his heart attack. After staying in the hospital for 2 weeks and then in rehab for another 2 weeks, his sleep schedule got messed up. He is taking melatonin about every night, some nights it helps others not so much.    Source of Stress Concerns  Chronic Illness    Comments  Jayziah has the beginning dementia. He feels like he has a pretty good grasp on the process and is doing everything he can to help himself. He also was put on dialysis during his hospital stay, but was taken off by discharge. He is very pleased with how his kidney function and A1C has improved in the last couple weeks. He and his wife love to travel so he is focusing on getting stronger so they can continue.       Family Dynamics   Good Support System?  Yes   wife and sons     Barriers   Psychosocial barriers to participate in program  There are no identifiable barriers or psychosocial needs.;The patient should benefit from training in stress management and relaxation.      Screening Interventions   Interventions  Encouraged to exercise;Program counselor consult;To provide support and resources with identified psychosocial needs;Provide feedback about the scores to participant    Expected Outcomes  Short Term goal: Utilizing psychosocial counselor, staff and physician to assist with identification of specific Stressors or current issues interfering with healing process. Setting desired goal for each stressor or current issue identified.;Long Term Goal: Stressors or current issues are controlled or eliminated.;Short Term goal: Identification and review with participant of any Quality of Life or Depression concerns found by scoring the questionnaire.;Long Term goal:  The participant improves quality of Life and PHQ9 Scores as seen by post scores and/or verbalization of changes       Quality of Life Scores:  Quality of Life - 11/19/17 1056      Quality of Life   Select  Quality of Life      Quality of Life Scores   Health/Function Pre  22.85 %    Socioeconomic Pre  29.17 %    Psych/Spiritual Pre  24.64 %    Family Pre  22.8 %    GLOBAL Pre  25.44 %      Scores of 19 and below usually indicate a poorer quality of life in these areas.  A difference of  2-3 points is a clinically meaningful difference.  A difference of 2-3 points in the total score of the Quality of Life Index has been associated with significant improvement in overall quality of life, self-image, physical symptoms, and general health in studies assessing change in quality of life.  PHQ-9: Recent Review Flowsheet Data    Depression screen [facility] 2/9 11/19/2017 06/05/2017 06/05/2017 02/26/2017 12/11/2016   Decreased Interest 2 0 0 0 0   Down, Depressed, Hopeless 0 0 0 0 0   PHQ - 2 Score 2 0 0 0 0   Altered sleeping 3 - - - 1   Tired, decreased energy 3 - - - 1   Change in appetite 1 - - - 0   Feeling bad or failure about yourself  1 - - - 0   Trouble concentrating 0 - - - 0   Moving slowly or fidgety/restless 0 - - - 0   Suicidal thoughts 0 - - - 0   PHQ-9 Score 10 - - - 2   Difficult doing work/chores Somewhat difficult - - - Not difficult at all     Interpretation of Total Score  Total Score Depression Severity:  1-4 = Minimal depression, 5-9 = Mild depression, 10-14 = Moderate depression, 15-19 = Moderately severe depression, 20-27 = Severe depression   Psychosocial Evaluation and Intervention:   Psychosocial Re-Evaluation:   Psychosocial Discharge (Final Psychosocial Re-Evaluation):  Vocational Rehabilitation: Provide vocational rehab assistance to qualifying candidates.   Vocational Rehab Evaluation & Intervention: Vocational Rehab - 11/19/17 1254      Initial  Vocational Rehab Evaluation & Intervention   Assessment shows need for Vocational Rehabilitation  No       Education: Education Goals: Education classes will be provided on a variety of topics geared toward better understanding of heart health and risk factor modification. Participant will state understanding/return demonstration of topics presented as noted by education test scores.  Learning Barriers/Preferences: Learning Barriers/Preferences - 11/19/17 1254      Learning Barriers/Preferences   Learning Barriers  Hearing    Learning Preferences  Verbal Instruction;Written Material       Education Topics:  AED/CPR: - Group verbal and written instruction with the use of models to demonstrate the basic use of the AED with the basic ABC's of resuscitation.   Cardiac Rehab from 12/18/2017 in [facility]  Date  12/13/17  Educator  SB  Instruction Review Code  1- Verbalizes Understanding      General Nutrition Guidelines/Fats and Fiber: -Group instruction provided by verbal, written material, models and posters to present the general guidelines for heart healthy nutrition. Gives an explanation and review of dietary fats and fiber.   Cardiac Rehab from 12/18/2017 in [facility] Cardiac and Pulmonary Rehab  Date  12/18/17  Educator  LB  Instruction Review Code  1- Verbalizes Understanding      Controlling Sodium/Reading Food Labels: -Group verbal and written material supporting the discussion of sodium use in heart healthy nutrition. Review and explanation with models, verbal and written materials for utilization of the food label.   Exercise Physiology & General Exercise Guidelines: - Group verbal and written instruction with models to review the exercise physiology of the cardiovascular system and associated critical values. Provides general exercise guidelines with specific guidelines to those with heart or lung disease.    Aerobic Exercise & Resistance  Training: - Gives group verbal and written instruction on the various components of exercise. Focuses on aerobic and resistive training programs and the benefits of this training and how to safely progress through these programs..   Flexibility, Balance, Mind/Body Relaxation: Provides group verbal/written instruction on the benefits of flexibility and balance training, including mind/body exercise modes such as yoga, pilates and tai chi.  Demonstration and skill practice provided.   Stress and Anxiety: - Provides group verbal and written instruction about the health risks of elevated stress and causes of high stress.  Discuss the correlation between heart/lung disease and anxiety and treatment options. Review healthy ways to manage with stress and anxiety.   Depression: - Provides group verbal and written instruction on the correlation between heart/lung disease and depressed mood, treatment options, and the stigmas associated with seeking treatment.   Anatomy & Physiology of the Heart: - Group verbal and written instruction and models provide basic cardiac anatomy and physiology, with the coronary electrical and arterial systems. Review of Valvular disease and Heart Failure   Cardiac Rehab from 12/18/2017 in [facility] Cardiac and Pulmonary Rehab  Date  11/29/17  Educator  CE  Instruction Review Code  1- Verbalizes Understanding      Cardiac Procedures: - Group verbal and written instruction to review commonly prescribed medications for heart disease. Reviews the medication, class of the drug, and side effects. Includes the steps to properly store meds and maintain the prescription regimen. (beta blockers and nitrates)   Cardiac Medications I: - Group verbal and  written instruction to review commonly prescribed medications for heart disease. Reviews the medication, class of the drug, and side effects. Includes the steps to properly store meds and maintain the prescription regimen.   Cardiac  Rehab from 12/18/2017 in [facility] Cardiac and Pulmonary Rehab  Date  12/04/17  Educator  SB  Instruction Review Code  1- Verbalizes Understanding      Cardiac Medications II: -Group verbal and written instruction to review commonly prescribed medications for heart disease. Reviews the medication, class of the drug, and side effects. (all other drug classes)    Go Sex-Intimacy & Heart Disease, Get SMART - Goal Setting: - Group verbal and written instruction through game format to discuss heart disease and the return to sexual intimacy. Provides group verbal and written material to discuss and apply goal setting through the application of the S.M.A.R.T. Method.   Other Matters of the Heart: - Provides group verbal, written materials and models to describe Stable Angina and Peripheral Artery. Includes description of the disease process and treatment options available to the cardiac patient.   Cardiac Rehab from 12/18/2017 in [facility] Cardiac and Pulmonary Rehab  Date  11/29/17  Educator  CE  Instruction Review Code  1- Verbalizes Understanding      Exercise & Equipment Safety: - Individual verbal instruction and demonstration of equipment use and safety with use of the equipment.   Cardiac Rehab from 12/18/2017 in [facility] Cardiac and Pulmonary Rehab  Date  11/19/17  Educator  [facility]  Instruction Review Code  1- Verbalizes Understanding      Infection Prevention: - Provides verbal and written material to individual with discussion of infection control including proper hand washing and proper equipment cleaning during exercise session.   Cardiac Rehab from 12/18/2017 in [facility] Cardiac and Pulmonary Rehab  Date  11/19/17  Educator  [facility]  Instruction Review Code  1- Verbalizes Understanding      Falls Prevention: - Provides verbal and written material to individual with discussion of falls prevention and safety.   Cardiac Rehab from 12/18/2017 in [facility] Cardiac and Pulmonary Rehab  Date  11/19/17   Educator  [facility]  Instruction Review Code  1- Verbalizes Understanding      Diabetes: - Individual verbal and written instruction to review signs/symptoms of diabetes, desired ranges of glucose level fasting, after meals and with exercise. Acknowledge that pre and post exercise glucose checks will be done for 3 sessions at entry of program.   Cardiac Rehab from 12/18/2017 in [facility] Cardiac and Pulmonary Rehab  Date  11/19/17  Educator  [facility]  Instruction Review Code  1- Verbalizes Understanding      Know Your Numbers and Risk Factors: -Group verbal and written instruction about important numbers in your health.  Discussion of what are risk factors and how they play a role in the disease process.  Review of Cholesterol, Blood Pressure, Diabetes, and BMI and the role they play in your overall health.   Sleep Hygiene: -Provides group verbal and written instruction about how sleep can affect your health.  Define sleep hygiene, discuss sleep cycles and impact of sleep habits. Review good sleep hygiene tips.    Other: -Provides group and verbal instruction on various topics (see comments)   Knowledge Questionnaire Score: Knowledge Questionnaire Score - 11/19/17 1057      Knowledge Questionnaire Score   Pre Score  19/26   Test reviewed with pt today.  Education Focus: A&P, Angina, Nutrition, Exercise, and Tobacco      Core  Components/Risk Factors/Patient Goals at Admission: Personal Goals and Risk Factors at Admission - 11/19/17 1253      Core Components/Risk Factors/Patient Goals on Admission    Weight Management  Yes;Weight Maintenance    Intervention  Weight Management: Develop a combined nutrition and exercise program designed to reach desired caloric intake, while maintaining appropriate intake of nutrient and fiber, sodium and fats, and appropriate energy expenditure required for the weight goal.;Weight Management: Provide education and appropriate resources to help participant work on  and attain dietary goals.    Admit Weight  153 lb (69.4 kg)   wants to stay between 150-155 lb   Expected Outcomes  Short Term: Continue to assess and modify interventions until short term weight is achieved;Long Term: Adherence to nutrition and physical activity/exercise program aimed toward attainment of established weight goal;Weight Maintenance: Understanding of the daily nutrition guidelines, which includes 25-35% calories from fat, 7% or less cal from saturated fats, less than 252m cholesterol, less than 1.5gm of sodium, & 5 or more servings of fruits and vegetables daily;Understanding recommendations for meals to include 15-35% energy as protein, 25-35% energy from fat, 35-60% energy from carbohydrates, less than 2057mof dietary cholesterol, 20-35 gm of total fiber daily;Understanding of distribution of calorie intake throughout the day with the consumption of 4-5 meals/snacks    Diabetes  Yes    Intervention  Provide education about signs/symptoms and action to take for hypo/hyperglycemia.;Provide education about proper nutrition, including hydration, and aerobic/resistive exercise prescription along with prescribed medications to achieve blood glucose in normal ranges: Fasting glucose 65-99 mg/dL    Expected Outcomes  Short Term: Participant verbalizes understanding of the signs/symptoms and immediate care of hyper/hypoglycemia, proper foot care and importance of medication, aerobic/resistive exercise and nutrition plan for blood glucose control.;Long Term: Attainment of HbA1C < 7%.    Hypertension  Yes    Intervention  Provide education on lifestyle modifcations including regular physical activity/exercise, weight management, moderate sodium restriction and increased consumption of fresh fruit, vegetables, and low fat dairy, alcohol moderation, and smoking cessation.;Monitor prescription use compliance.    Expected Outcomes  Short Term: Continued assessment and intervention until BP is <  140/9024mG in hypertensive participants. < 130/83m22m in hypertensive participants with diabetes, heart failure or chronic kidney disease.;Long Term: Maintenance of blood pressure at goal levels.    Lipids  Yes    Intervention  Provide education and support for participant on nutrition & aerobic/resistive exercise along with prescribed medications to achieve LDL <70mg65mL >40mg.39mExpected Outcomes  Short Term: Participant states understanding of desired cholesterol values and is compliant with medications prescribed. Participant is following exercise prescription and nutrition guidelines.;Long Term: Cholesterol controlled with medications as prescribed, with individualized exercise RX and with personalized nutrition plan. Value goals: LDL < 70mg, 58m> 40 mg.       Core Components/Risk Factors/Patient Goals Review:    Core Components/Risk Factors/Patient Goals at Discharge (Final Review):    ITP Comments: ITP Comments    Row Name 11/19/17 1249 11/21/17 0645 12/19/17 0606       ITP Comments  Med Review completed. Initial ITP created. [facility]  [facility] review.  Continue with ITP unless directed changes per Medical Director review.  30 day review. Continue with ITP unless direccted changes per Medical Director Chart Review.        Comments:

## 2017-12-20 DIAGNOSIS — E1122 Type 2 diabetes mellitus with diabetic chronic kidney disease: Secondary | ICD-10-CM | POA: Diagnosis not present

## 2017-12-20 DIAGNOSIS — Z8673 Personal history of transient ischemic attack (TIA), and cerebral infarction without residual deficits: Secondary | ICD-10-CM | POA: Diagnosis not present

## 2017-12-20 DIAGNOSIS — Z955 Presence of coronary angioplasty implant and graft: Secondary | ICD-10-CM

## 2017-12-20 DIAGNOSIS — N184 Chronic kidney disease, stage 4 (severe): Secondary | ICD-10-CM | POA: Diagnosis not present

## 2017-12-20 DIAGNOSIS — I214 Non-ST elevation (NSTEMI) myocardial infarction: Secondary | ICD-10-CM

## 2017-12-20 DIAGNOSIS — I129 Hypertensive chronic kidney disease with stage 1 through stage 4 chronic kidney disease, or unspecified chronic kidney disease: Secondary | ICD-10-CM | POA: Diagnosis not present

## 2017-12-20 NOTE — Progress Notes (Signed)
Daily Session Note  Patient Details  Name: Ray Lamb MRN: [id number] Date of Birth: 03/06/1930 Referring Provider:     Cardiac Rehab from 11/19/2017 in [facility] Cardiac and Pulmonary Rehab  Referring Provider  Khan, Shaukat MD      Encounter Date: 12/20/2017  Check In: Session Check In - 12/20/17 1018      Check-In   Supervising physician immediately available to respond to emergencies  See telemetry face sheet for immediately available ER MD    Location  [facility]-Cardiac & Pulmonary Rehab    Staff Present  Carroll Enterkin, RN, BSN;Other; , MA, RCEP, CCRP, Exercise Physiologist;Joseph Hood RCP,RRT,BSRT   Jeanna Durrell   Medication changes reported      No    Fall or balance concerns reported     No    Warm-up and Cool-down  Performed on first and last piece of equipment    Resistance Training Performed  Yes    VAD Patient?  No    PAD/SET Patient?  No      Pain Assessment   Currently in Pain?  No/denies          Social History   Tobacco Use  Smoking Status Former Smoker  . Last attempt to quit: 09/02/1964  . Years since quitting: 53.3  Smokeless Tobacco Never Used    Goals Met:  Independence with exercise equipment Exercise tolerated well Personal goals reviewed No report of cardiac concerns or symptoms Strength training completed today  Goals Unmet:  Not Applicable  Comments: Pt able to follow exercise prescription today without complaint.  Will continue to monitor for progression.    Dr. Mark Miller is Medical Director for [facility] and [facility]. 

## 2017-12-25 DIAGNOSIS — Z955 Presence of coronary angioplasty implant and graft: Secondary | ICD-10-CM

## 2017-12-25 DIAGNOSIS — I214 Non-ST elevation (NSTEMI) myocardial infarction: Secondary | ICD-10-CM

## 2017-12-25 DIAGNOSIS — I129 Hypertensive chronic kidney disease with stage 1 through stage 4 chronic kidney disease, or unspecified chronic kidney disease: Secondary | ICD-10-CM | POA: Diagnosis not present

## 2017-12-25 DIAGNOSIS — Z8673 Personal history of transient ischemic attack (TIA), and cerebral infarction without residual deficits: Secondary | ICD-10-CM | POA: Diagnosis not present

## 2017-12-25 DIAGNOSIS — N184 Chronic kidney disease, stage 4 (severe): Secondary | ICD-10-CM | POA: Diagnosis not present

## 2017-12-25 DIAGNOSIS — E1122 Type 2 diabetes mellitus with diabetic chronic kidney disease: Secondary | ICD-10-CM | POA: Diagnosis not present

## 2017-12-25 NOTE — Progress Notes (Signed)
Daily Session Note  Patient Details  Name: Ray Lamb MRN: [id number] Date of Birth: 01/03/1931 Referring Provider:     Cardiac Rehab from 11/19/2017 in [facility] Cardiac and Pulmonary Rehab  Referring Provider  Neoma Laming MD      Encounter Date: 12/25/2017  Check In: Session Check In - 12/25/17 0940      Check-In   Supervising physician immediately available to respond to emergencies  See telemetry face sheet for immediately available ER MD    Staff Present  Alberteen Sam, MA, RCEP, CCRP, Exercise Physiologist;Joseph Thornton [employer];Heath Lark, RN, BSN, CCRP    Medication changes reported      No    Warm-up and Cool-down  Performed on first and last piece of equipment    Resistance Training Performed  Yes    VAD Patient?  No    PAD/SET Patient?  No      Pain Assessment   Currently in Pain?  No/denies          Social History   Tobacco Use  Smoking Status Former Smoker  . Last attempt to quit: 09/02/1964  . Years since quitting: 53.3  Smokeless Tobacco Never Used    Goals Met:  Independence with exercise equipment Exercise tolerated well No report of cardiac concerns or symptoms Strength training completed today  Goals Unmet:  Not Applicable  Comments: Pt able to follow exercise prescription today without complaint.  Will continue to monitor for progression.   Dr. Emily Filbert is Medical Director for [place] and [facility].

## 2017-12-27 ENCOUNTER — Other Ambulatory Visit: Payer: Self-pay | Admitting: Adult Health

## 2017-12-27 ENCOUNTER — Encounter: Payer: Medicare Other | Admitting: *Deleted

## 2017-12-27 DIAGNOSIS — I214 Non-ST elevation (NSTEMI) myocardial infarction: Secondary | ICD-10-CM

## 2017-12-27 DIAGNOSIS — I129 Hypertensive chronic kidney disease with stage 1 through stage 4 chronic kidney disease, or unspecified chronic kidney disease: Secondary | ICD-10-CM | POA: Diagnosis not present

## 2017-12-27 DIAGNOSIS — N184 Chronic kidney disease, stage 4 (severe): Secondary | ICD-10-CM | POA: Diagnosis not present

## 2017-12-27 DIAGNOSIS — Z955 Presence of coronary angioplasty implant and graft: Secondary | ICD-10-CM

## 2017-12-27 DIAGNOSIS — Z8673 Personal history of transient ischemic attack (TIA), and cerebral infarction without residual deficits: Secondary | ICD-10-CM | POA: Diagnosis not present

## 2017-12-27 DIAGNOSIS — E1122 Type 2 diabetes mellitus with diabetic chronic kidney disease: Secondary | ICD-10-CM | POA: Diagnosis not present

## 2017-12-27 NOTE — Progress Notes (Signed)
Daily Session Note  Patient Details  Name: Ray Lamb MRN: [id number] Date of Birth: [birth year] Referring Provider:     Cardiac Rehab from 11/19/2017 in [facility] Cardiac and Pulmonary Rehab  Referring Provider  Neoma Laming MD      Encounter Date: 12/27/2017  Check In: Session Check In - 12/27/17 0926      Check-In   Supervising physician immediately available to respond to emergencies  See telemetry face sheet for immediately available ER MD    Location  [facility]-Cardiac & Pulmonary Rehab    Staff Present  Alberteen Sam, MA, RCEP, CCRP, Exercise Physiologist;Joseph Hood RCP,RRT,BSRT;Carroll Enterkin, Therapist, sports, BSN    Medication changes reported      No    Fall or balance concerns reported     No    Warm-up and Cool-down  Performed on first and last piece of equipment    Resistance Training Performed  Yes    VAD Patient?  No    PAD/SET Patient?  No      Pain Assessment   Currently in Pain?  No/denies          Social History   Tobacco Use  Smoking Status Former Smoker  . Last attempt to quit: 09/02/1964  . Years since quitting: 53.3  Smokeless Tobacco Never Used    Goals Met:  Independence with exercise equipment Exercise tolerated well Personal goals reviewed No report of cardiac concerns or symptoms Strength training completed today  Goals Unmet:  Not Applicable  Comments: Pt able to follow exercise prescription today without complaint.  Will continue to monitor for progression.    Dr. Emily Filbert is Medical Director for [place] and [facility].

## 2018-01-01 DIAGNOSIS — E1122 Type 2 diabetes mellitus with diabetic chronic kidney disease: Secondary | ICD-10-CM | POA: Diagnosis not present

## 2018-01-01 DIAGNOSIS — I214 Non-ST elevation (NSTEMI) myocardial infarction: Secondary | ICD-10-CM | POA: Diagnosis not present

## 2018-01-01 DIAGNOSIS — Z955 Presence of coronary angioplasty implant and graft: Secondary | ICD-10-CM | POA: Diagnosis not present

## 2018-01-01 DIAGNOSIS — Z8673 Personal history of transient ischemic attack (TIA), and cerebral infarction without residual deficits: Secondary | ICD-10-CM | POA: Diagnosis not present

## 2018-01-01 DIAGNOSIS — N184 Chronic kidney disease, stage 4 (severe): Secondary | ICD-10-CM | POA: Diagnosis not present

## 2018-01-01 DIAGNOSIS — I129 Hypertensive chronic kidney disease with stage 1 through stage 4 chronic kidney disease, or unspecified chronic kidney disease: Secondary | ICD-10-CM | POA: Diagnosis not present

## 2018-01-01 NOTE — Progress Notes (Signed)
Daily Session Note  Patient Details  Name: KINDRED REIDINGER MRN: [id number] Date of Birth: 10-30-1930 Referring Provider:     Cardiac Rehab from 11/19/2017 in [facility] Cardiac and Pulmonary Rehab  Referring Provider  Neoma Laming MD      Encounter Date: 01/01/2018  Check In: Session Check In - 01/01/18 1112      Check-In   Supervising physician immediately available to respond to emergencies  See telemetry face sheet for immediately available ER MD    Location  [facility]-Cardiac & Pulmonary Rehab    Staff Present  Heath Lark, RN, BSN, CCRP;Jessica [place], MA, RCEP, CCRP, Exercise Physiologist;Jamier Urbas BS, Exercise Physiologist;Amanda Oletta Darter, BA, ACSM CEP, Exercise Physiologist    Medication changes reported      No    Fall or balance concerns reported     No    Warm-up and Cool-down  Performed on first and last piece of equipment    Resistance Training Performed  Yes    VAD Patient?  No      Pain Assessment   Currently in Pain?  No/denies    Multiple Pain Sites  No          Social History   Tobacco Use  Smoking Status Former Smoker  . Last attempt to quit: 09/02/1964  . Years since quitting: 53.3  Smokeless Tobacco Never Used    Goals Met:  Independence with exercise equipment Exercise tolerated well No report of cardiac concerns or symptoms Strength training completed today  Goals Unmet:  Not Applicable  Comments: Pt able to follow exercise prescription today without complaint.  Will continue to monitor for progression.    Dr. Emily Filbert is Medical Director for [place] and [facility].

## 2018-01-08 ENCOUNTER — Encounter: Payer: Medicare Other | Admitting: *Deleted

## 2018-01-08 ENCOUNTER — Other Ambulatory Visit: Payer: Self-pay | Admitting: Adult Health

## 2018-01-08 DIAGNOSIS — N184 Chronic kidney disease, stage 4 (severe): Secondary | ICD-10-CM | POA: Diagnosis not present

## 2018-01-08 DIAGNOSIS — Z955 Presence of coronary angioplasty implant and graft: Secondary | ICD-10-CM

## 2018-01-08 DIAGNOSIS — I129 Hypertensive chronic kidney disease with stage 1 through stage 4 chronic kidney disease, or unspecified chronic kidney disease: Secondary | ICD-10-CM | POA: Diagnosis not present

## 2018-01-08 DIAGNOSIS — Z8673 Personal history of transient ischemic attack (TIA), and cerebral infarction without residual deficits: Secondary | ICD-10-CM | POA: Diagnosis not present

## 2018-01-08 DIAGNOSIS — I214 Non-ST elevation (NSTEMI) myocardial infarction: Secondary | ICD-10-CM

## 2018-01-08 DIAGNOSIS — E1122 Type 2 diabetes mellitus with diabetic chronic kidney disease: Secondary | ICD-10-CM | POA: Diagnosis not present

## 2018-01-08 NOTE — Progress Notes (Signed)
Daily Session Note  Patient Details  Name: Ray Lamb MRN: [id number] Date of Birth: 09-16-1930 Referring Provider:     Cardiac Rehab from 11/19/2017 in [facility] Cardiac and Pulmonary Rehab  Referring Provider  Neoma Laming MD      Encounter Date: 01/08/2018  Check In: Session Check In - 01/08/18 0919      Check-In   Supervising physician immediately available to respond to emergencies  See telemetry face sheet for immediately available ER MD    Location  [facility]-Cardiac & Pulmonary Rehab    Staff Present  Heath Lark, RN, BSN, CCRP;Jeanna Durrell BS, Exercise Physiologist;Amanda Oletta Darter, BA, ACSM CEP, Exercise Physiologist    Medication changes reported      No    Fall or balance concerns reported     No    Warm-up and Cool-down  Performed as group-led instruction    Resistance Training Performed  Yes    VAD Patient?  No    PAD/SET Patient?  No      Pain Assessment   Currently in Pain?  No/denies          Social History   Tobacco Use  Smoking Status Former Smoker  . Last attempt to quit: 09/02/1964  . Years since quitting: 53.3  Smokeless Tobacco Never Used    Goals Met:  Independence with exercise equipment Exercise tolerated well No report of cardiac concerns or symptoms Strength training completed today  Goals Unmet:  Not Applicable  Comments: Pt able to follow exercise prescription today without complaint.  Will continue to monitor for progression.    Dr. Emily Filbert is Medical Director for [place] and [facility].

## 2018-01-15 ENCOUNTER — Encounter: Payer: Medicare Other | Attending: Cardiology

## 2018-01-15 DIAGNOSIS — Z79899 Other long term (current) drug therapy: Secondary | ICD-10-CM | POA: Insufficient documentation

## 2018-01-15 DIAGNOSIS — K219 Gastro-esophageal reflux disease without esophagitis: Secondary | ICD-10-CM | POA: Diagnosis not present

## 2018-01-15 DIAGNOSIS — Z955 Presence of coronary angioplasty implant and graft: Secondary | ICD-10-CM | POA: Diagnosis not present

## 2018-01-15 DIAGNOSIS — Z8673 Personal history of transient ischemic attack (TIA), and cerebral infarction without residual deficits: Secondary | ICD-10-CM | POA: Insufficient documentation

## 2018-01-15 DIAGNOSIS — I2510 Atherosclerotic heart disease of native coronary artery without angina pectoris: Secondary | ICD-10-CM | POA: Insufficient documentation

## 2018-01-15 DIAGNOSIS — I10 Essential (primary) hypertension: Secondary | ICD-10-CM | POA: Diagnosis not present

## 2018-01-15 DIAGNOSIS — E1122 Type 2 diabetes mellitus with diabetic chronic kidney disease: Secondary | ICD-10-CM | POA: Diagnosis not present

## 2018-01-15 DIAGNOSIS — E785 Hyperlipidemia, unspecified: Secondary | ICD-10-CM | POA: Diagnosis not present

## 2018-01-15 DIAGNOSIS — Z87891 Personal history of nicotine dependence: Secondary | ICD-10-CM | POA: Diagnosis not present

## 2018-01-15 DIAGNOSIS — I252 Old myocardial infarction: Secondary | ICD-10-CM | POA: Insufficient documentation

## 2018-01-15 DIAGNOSIS — N184 Chronic kidney disease, stage 4 (severe): Secondary | ICD-10-CM | POA: Diagnosis not present

## 2018-01-15 DIAGNOSIS — I25810 Atherosclerosis of coronary artery bypass graft(s) without angina pectoris: Secondary | ICD-10-CM | POA: Diagnosis not present

## 2018-01-15 DIAGNOSIS — I129 Hypertensive chronic kidney disease with stage 1 through stage 4 chronic kidney disease, or unspecified chronic kidney disease: Secondary | ICD-10-CM | POA: Insufficient documentation

## 2018-01-15 DIAGNOSIS — I340 Nonrheumatic mitral (valve) insufficiency: Secondary | ICD-10-CM | POA: Diagnosis not present

## 2018-01-15 DIAGNOSIS — Z7902 Long term (current) use of antithrombotics/antiplatelets: Secondary | ICD-10-CM | POA: Insufficient documentation

## 2018-01-15 DIAGNOSIS — I214 Non-ST elevation (NSTEMI) myocardial infarction: Secondary | ICD-10-CM | POA: Insufficient documentation

## 2018-01-15 DIAGNOSIS — Z7984 Long term (current) use of oral hypoglycemic drugs: Secondary | ICD-10-CM | POA: Insufficient documentation

## 2018-01-15 DIAGNOSIS — R0602 Shortness of breath: Secondary | ICD-10-CM | POA: Diagnosis not present

## 2018-01-15 NOTE — Progress Notes (Signed)
Daily Session Note  Patient Details  Name: Ray Lamb MRN: [id number] Date of Birth: 14-Oct-1930 Referring Provider:     Cardiac Rehab from 11/19/2017 in [facility] Cardiac and Pulmonary Rehab  Referring Provider  Neoma Laming MD      Encounter Date: 01/15/2018  Check In: Session Check In - 01/15/18 0926      Check-In   Supervising physician immediately available to respond to emergencies  See telemetry face sheet for immediately available ER MD    Location  [facility]-Cardiac & Pulmonary Rehab    Staff Present  Heath Lark, RN, BSN, CCRP;Jessica Luan [place], MA, RCEP, CCRP, Exercise Physiologist;Jeanna Durrell BS, Exercise Physiologist    Medication changes reported      No    Fall or balance concerns reported     No    Warm-up and Cool-down  Performed as group-led instruction    Resistance Training Performed  Yes    VAD Patient?  No    PAD/SET Patient?  No      Pain Assessment   Currently in Pain?  No/denies          Social History   Tobacco Use  Smoking Status Former Smoker  . Last attempt to quit: 09/02/1964  . Years since quitting: 53.4  Smokeless Tobacco Never Used    Goals Met:  Independence with exercise equipment Exercise tolerated well No report of cardiac concerns or symptoms Strength training completed today  Goals Unmet:  Not Applicable  Comments: Pt able to follow exercise prescription today without complaint.  Will continue to monitor for progression.    Dr. Emily Filbert is Medical Director for [place] and [facility].

## 2018-01-16 ENCOUNTER — Encounter: Payer: Self-pay | Admitting: *Deleted

## 2018-01-16 DIAGNOSIS — Z955 Presence of coronary angioplasty implant and graft: Secondary | ICD-10-CM

## 2018-01-16 DIAGNOSIS — I214 Non-ST elevation (NSTEMI) myocardial infarction: Secondary | ICD-10-CM

## 2018-01-16 NOTE — Progress Notes (Signed)
Cardiac Individual Treatment Plan  Patient Details  Name: Ray Lamb MRN: [id number] Date of Birth: 03-Jun-1930 Referring Provider:     Cardiac Rehab from 11/19/2017 in [facility] Cardiac and Pulmonary Rehab  Referring Provider  Neoma Laming MD      Initial Encounter Date:    Cardiac Rehab from 11/19/2017 in [facility] Cardiac and Pulmonary Rehab  Date  11/19/17      Visit Diagnosis: NSTEMI (non-ST elevated myocardial infarction) [facility])  Status post coronary artery stent placement  Patient's Home Medications on Admission:  Current Outpatient Medications:  .  atorvastatin (LIPITOR) 80 MG tablet, Take 1 tablet (80 mg total) by mouth at bedtime., Disp: 90 tablet, Rfl: 4 .  clopidogrel (PLAVIX) 75 MG tablet, Take 1 tablet (75 mg total) by mouth daily with breakfast., Disp: , Rfl:  .  donepezil (ARICEPT) 5 MG tablet, Take 5 mg by mouth at bedtime., Disp: , Rfl:  .  ezetimibe (ZETIA) 10 MG tablet, Take 1 tablet (10 mg total) by mouth daily., Disp: 90 tablet, Rfl: 4 .  furosemide (LASIX) 40 MG tablet, Take 40 mg by mouth daily as needed for fluid. , Disp: , Rfl:  .  glipiZIDE (GLUCOTROL) 5 MG tablet, Take 5 mg by mouth daily before breakfast., Disp: , Rfl:  .  hydrALAZINE (APRESOLINE) 25 MG tablet, , Disp: , Rfl:  .  isosorbide mononitrate (IMDUR) 30 MG 24 hr tablet, Take 30 mg by mouth daily. , Disp: , Rfl:  .  Melatonin 3 MG CAPS, Take 3 mg by mouth at bedtime. , Disp: , Rfl:  .  metoprolol tartrate (LOPRESSOR) 50 MG tablet, Take 1 tablet (50 mg total) by mouth every 12 (twelve) hours., Disp: , Rfl:  .  Multiple Vitamin (MULTIVITAMIN) tablet, Take 1 tablet by mouth daily., Disp: , Rfl:  .  NON FORMULARY, Diet order: Downgrade patient to dysphagia 2 diet, continue with thin liquids; NO straws, add extra gravy., Disp: , Rfl:  .  Nutritional Supplements (FEEDING SUPPLEMENT, NEPRO CARB STEADY,) LIQD, Take 237 mLs by mouth 3 (three) times daily between meals., Disp: , Rfl: 0 .  sitaGLIPtin (JANUVIA) 25  MG tablet, Take 25 mg by mouth daily., Disp: , Rfl:  .  timolol (TIMOPTIC) 0.5 % ophthalmic solution, Place 1 drop into both eyes daily. , Disp: , Rfl:  .  TURMERIC PO, Take 1,300 mg by mouth daily. , Disp: , Rfl:   Past Medical History: Past Medical History:  Diagnosis Date  . Anemia   . Chronic kidney disease    STAGE 4  . Coronary atherosclerosis    CABG X 4  . Diabetes mellitus without complication ([place])   . Edema    ankle  . GERD (gastroesophageal reflux disease)   . Glaucoma   . Hearing loss   . Heart murmur   . History of hiatal hernia   . Hyperlipidemia   . Hypertension   . Mini stroke ([place])   . Mitral insufficiency   . Myocardial infarction ([place])    mild, heart cath done no stents needed  . Stroke [facility]) 2011   TIA    Tobacco Use: Social History   Tobacco Use  Smoking Status Former Smoker  . Last attempt to quit: 09/02/1964  . Years since quitting: 53.4  Smokeless Tobacco Never Used    Labs: Recent Chemical engineer    Labs for ITP Cardiac and Pulmonary Rehab Latest Ref Rng & Units 09/04/2017 09/30/2017 10/09/2017 10/10/2017 11/06/2017   Cholestrol <200  mg/dL 106 - - - -   LDLCALC 0 - 99 mg/dL - - - - -   HDL >39 mg/dL - - - - -   Trlycerides <150 mg/dL 64 - - - -   Hemoglobin A1c 4.8 - 5.6 % - - - - 6.8(H)   PHART 7.350 - 7.450 - 7.20(L) 7.47(H) 7.48(H) -   PCO2ART 32.0 - 48.0 mmHg - 37 45 45 -   HCO3 20.0 - 28.0 mmol/L - 14.5(L) 32.8(H) 33.5(H) -   ACIDBASEDEF 0.0 - 2.0 mmol/L - 12.6(H) - - -   O2SAT % - 91.0 91.1 97.9 -       Exercise Target Goals: Exercise Program Goal: Individual exercise prescription set using results from initial 6 min walk test and THRR while considering  patient's activity barriers and safety.   Exercise Prescription Goal: Initial exercise prescription builds to 30-45 minutes a day of aerobic activity, 2-3 days per week.  Home exercise guidelines will be given to patient during program as part of exercise prescription that  the participant will acknowledge.  Activity Barriers & Risk Stratification: Activity Barriers & Cardiac Risk Stratification - 11/19/17 1220      Activity Barriers & Cardiac Risk Stratification   Activity Barriers  Decreased Ventricular Function;Deconditioning;Muscular Weakness;Balance Concerns;History of Falls;Assistive Device    Cardiac Risk Stratification  High       6 Minute Walk: 6 Minute Walk    Row Name 11/19/17 1219         6 Minute Walk   Phase  Initial     Distance  585 feet     Walk Time  6 minutes     # of Rest Breaks  0     MPH  1.11     METS  0.95     RPE  12     VO2 Peak  3.31     Symptoms  No     Resting HR  63 bpm     Resting BP  122/64     Resting Oxygen Saturation   100 %     Exercise Oxygen Saturation  during 6 min walk  100 %     Max Ex. HR  100 bpm     Max Ex. BP  134/76     2 Minute Post BP  124/64        Oxygen Initial Assessment:   Oxygen Re-Evaluation:   Oxygen Discharge (Final Oxygen Re-Evaluation):   Initial Exercise Prescription: Initial Exercise Prescription - 11/19/17 1200      Date of Initial Exercise RX and Referring Provider   Date  11/19/17    Referring Provider  Neoma Laming MD      Treadmill   MPH  0.8    Grade  0.5    Minutes  15    METs  1.7      Recumbant Bike   Level  1    RPM  50    Watts  10    Minutes  15    METs  1.7      NuStep   Level  1    SPM  80    Minutes  15    METs  1.7      Prescription Details   Frequency (times per week)  2    Duration  Progress to 30 minutes of continuous aerobic without signs/symptoms of physical distress      Intensity   THRR 40-80% of Max Heartrate  92-121    Ratings of Perceived Exertion  11-13    Perceived Dyspnea  0-4      Progression   Progression  Continue to progress workloads to maintain intensity without signs/symptoms of physical distress.      Resistance Training   Training Prescription  Yes    Weight  3 lbs    Reps  10-15       Perform  Capillary Blood Glucose checks as needed.  Exercise Prescription Changes: Exercise Prescription Changes    Row Name 11/19/17 1200 12/13/17 1000 12/25/17 1600 01/07/18 1600       Response to Exercise   Blood Pressure (Admit)  122/64  -  124/74  124/70    Blood Pressure (Exercise)  134/76  -  144/68  144/58    Blood Pressure (Exit)  124/64  -  136/60  104/54    Heart Rate (Admit)  63 bpm  -  91 bpm  68 bpm    Heart Rate (Exercise)  100 bpm  -  92 bpm  91 bpm    Heart Rate (Exit)  63 bpm  -  62 bpm  65 bpm    Oxygen Saturation (Admit)  100 %  -  -  -    Oxygen Saturation (Exercise)  100 %  -  -  -    Rating of Perceived Exertion (Exercise)  12  -  13  13    Symptoms  none  -  none  none    Comments  walk test results  -  -  -    Duration  -  -  Continue with 30 min of aerobic exercise without signs/symptoms of physical distress.  Continue with 30 min of aerobic exercise without signs/symptoms of physical distress.    Intensity  -  -  THRR unchanged  THRR unchanged      Progression   Progression  -  -  Continue to progress workloads to maintain intensity without signs/symptoms of physical distress.  Continue to progress workloads to maintain intensity without signs/symptoms of physical distress.    Average METs  -  -  2.55  2.43      Resistance Training   Training Prescription  -  -  Yes  Yes    Weight  -  -  3 lbs  3 lbs    Reps  -  -  10-15  10-15      Interval Training   Interval Training  -  -  No  No      Treadmill   MPH  -  -  0.9  1.2    Grade  -  -  0.5  0.5    Minutes  -  -  15  15    METs  -  -  1.75  2      Recumbant Bike   Level  -  -  3  3    Watts  -  -  22  20    Minutes  -  -  15  15    METs  -  -  2.9  2.9      NuStep   Level  -  -  5  5    Minutes  -  -  15  15    METs  -  -  3  2.4      Home Exercise Plan   Plans to continue exercise at  -  Forensic scientist (comment) treadmill, recumbent bike, and [employer] at the [employer] (comment)  treadmill, recumbent bike, and [employer] at the [employer] (comment) treadmill, recumbent bike, and Nustep at the [employer]  -  Add 2 additional days to program exercise sessions.  Add 2 additional days to program exercise sessions.  Add 2 additional days to program exercise sessions.    Initial Home Exercises Provided  -  12/13/17  12/13/17  12/13/17       Exercise Comments: Exercise Comments    Row Name 11/29/17 0926           Exercise Comments  First full day of exercise!  Patient was oriented to gym and equipment including functions, settings, policies, and procedures.  Patient's individual exercise prescription and treatment plan were reviewed.  All starting workloads were established based on the results of the 6 minute walk test done at initial orientation visit.  The plan for exercise progression was also introduced and progression will be customized based on patient's performance and goals.          Exercise Goals and Review: Exercise Goals    Row Name 11/19/17 1226             Exercise Goals   Increase Physical Activity  Yes       Intervention  Provide advice, education, support and counseling about physical activity/exercise needs.;Develop an individualized exercise prescription for aerobic and resistive training based on initial evaluation findings, risk stratification, comorbidities and participant's personal goals.       Expected Outcomes  Short Term: Attend rehab on a regular basis to increase amount of physical activity.;Long Term: Add in home exercise to make exercise part of routine and to increase amount of physical activity.;Long Term: Exercising regularly at least 3-5 days a week.       Increase Strength and Stamina  Yes       Intervention  Provide advice, education, support and counseling about physical activity/exercise needs.;Develop an individualized exercise prescription for aerobic and resistive training based on initial evaluation findings,  risk stratification, comorbidities and participant's personal goals.       Expected Outcomes  Short Term: Increase workloads from initial exercise prescription for resistance, speed, and METs.;Short Term: Perform resistance training exercises routinely during rehab and add in resistance training at home;Long Term: Improve cardiorespiratory fitness, muscular endurance and strength as measured by increased METs and functional capacity (6MWT)       Able to understand and use rate of perceived exertion (RPE) scale  Yes       Intervention  Provide education and explanation on how to use RPE scale       Expected Outcomes  Short Term: Able to use RPE daily in rehab to express subjective intensity level;Long Term:  Able to use RPE to guide intensity level when exercising independently       Knowledge and understanding of Target Heart Rate Range (THRR)  Yes       Intervention  Provide education and explanation of THRR including how the numbers were predicted and where they are located for reference       Expected Outcomes  Short Term: Able to state/look up THRR;Short Term: Able to use daily as guideline for intensity in rehab;Long Term: Able to use THRR to govern intensity when exercising independently       Able to check pulse independently  Yes       Intervention  Provide education  and demonstration on how to check pulse in carotid and radial arteries.;Review the importance of being able to check your own pulse for safety during independent exercise       Expected Outcomes  Short Term: Able to explain why pulse checking is important during independent exercise;Long Term: Able to check pulse independently and accurately       Understanding of Exercise Prescription  Yes       Intervention  Provide education, explanation, and written materials on patient's individual exercise prescription       Expected Outcomes  Short Term: Able to explain program exercise prescription;Long Term: Able to explain home exercise  prescription to exercise independently          Exercise Goals Re-Evaluation : Exercise Goals Re-Evaluation    Row Name 11/29/17 0926 12/11/17 1546 12/13/17 1010 12/25/17 1631 12/27/17 1018     Exercise Goal Re-Evaluation   Exercise Goals Review  Increase Physical Activity;Increase Strength and Stamina;Able to understand and use rate of perceived exertion (RPE) scale;Knowledge and understanding of Target Heart Rate Range (THRR);Understanding of Exercise Prescription  Increase Physical Activity;Increase Strength and Stamina;Understanding of Exercise Prescription  Increase Physical Activity;Able to understand and use rate of perceived exertion (RPE) scale;Knowledge and understanding of Target Heart Rate Range (THRR);Increase Strength and Stamina;Able to check pulse independently;Understanding of Exercise Prescription  Increase Physical Activity;Increase Strength and Stamina;Understanding of Exercise Prescription  Increase Physical Activity;Increase Strength and Stamina;Understanding of Exercise Prescription   Comments  Reviewed RPE scale, THR and program prescription with pt today.  Pt voiced understanding and was given a copy of goals to take home.  Ray Lamb is off to a good start in rehab.  He is already up to level 2 on the recumbent bike.  We will continue monitor his progression.   Reviewed home exercise with pt today.  Pt plans to exercise at [facility] using the treadmill, recumbent bike, NuStep and hand weights for exercise.  Reviewed THR, pulse, RPE, sign and symptoms, NTG use, and when to call 911 or MD.  Also discussed weather considerations and indoor options.  Pt voiced understanding.  Ray Lamb has been doing well in rehab.  He is up to 22 watts at level 3 on the recumbent bike.  We will continue to monitor progression.   Ray Lamb is doing well in rehab.  He is feeling pretty good with his exercise.  He is starting to feel stronger and has more stamina.  He has even started to leave his cane in one spot while  in class.  He continues to go gym on his off days  for 5 days a week and does 49mn at the gym usually treadmill and NuStep.    Expected Outcomes  Short: Use RPE daily to regulate intensity. Long: Follow program prescription in THR.  Short: Review home exercise guidelines  Long: Continue increase strength and stamina  Short: HJaquavisis going to add 1-2 days and eventually 3 days of exercise at [employer] the treadmill, recumbent bike, [employer], and hand weights. Long: Continue exercising outside of class and walking 30 minutes on the treadmill without stopping.   Short: Continue to push him to do more.  Long: Continue to exercise on his own on off days.   Short: Increase treadmill at gym like he did here.  Long: Continue to exercise on his own.    [place] 01/07/18 1658             Exercise Goal Re-Evaluation   Exercise  Goals Review  Increase Physical Activity;Increase Strength and Stamina;Understanding of Exercise Prescription       Comments  Ray Lamb is doing well in rehab.  He is up to 1.2 mph on the treadmill now.  We will continue to monitor his progress.        Expected Outcomes  Short: Continue to walk more. Long: Continue to increase strength and stamina.           Discharge Exercise Prescription (Final Exercise Prescription Changes): Exercise Prescription Changes - 01/07/18 1600      Response to Exercise   Blood Pressure (Admit)  124/70    Blood Pressure (Exercise)  144/58    Blood Pressure (Exit)  104/54    Heart Rate (Admit)  68 bpm    Heart Rate (Exercise)  91 bpm    Heart Rate (Exit)  65 bpm    Rating of Perceived Exertion (Exercise)  13    Symptoms  none    Duration  Continue with 30 min of aerobic exercise without signs/symptoms of physical distress.    Intensity  THRR unchanged      Progression   Progression  Continue to progress workloads to maintain intensity without signs/symptoms of physical distress.    Average METs  2.43      Resistance Training   Training  Prescription  Yes    Weight  3 lbs    Reps  10-15      Interval Training   Interval Training  No      Treadmill   MPH  1.2    Grade  0.5    Minutes  15    METs  2      Recumbant Bike   Level  3    Watts  20    Minutes  15    METs  2.9      NuStep   Level  5    Minutes  15    METs  2.4      Home Exercise Plan   Plans to continue exercise at  [facility] (comment)   treadmill, recumbent bike, and Nustep at the [employer]  Add 2 additional days to program exercise sessions.    Initial Home Exercises Provided  12/13/17       Nutrition:  Target Goals: Understanding of nutrition guidelines, daily intake of sodium <1575m, cholesterol <2051m calories 30% from fat and 7% or less from saturated fats, daily to have 5 or more servings of fruits and vegetables.  Biometrics: Pre Biometrics - 11/19/17 1227      Pre Biometrics   Height  5' 5.3" (1.659 m)    Weight  153 lb 8 oz (69.6 kg)    Waist Circumference  35 inches    Hip Circumference  39 inches    Waist to Hip Ratio  0.9 %    BMI (Calculated)  25.3    Single Leg Stand  1.58 seconds        Nutrition Therapy Plan and Nutrition Goals: Nutrition Therapy & Goals - 11/29/17 1249      Nutrition Therapy   Diet  DM    Drug/Food Interactions  Statins/Certain Fruits    Protein (specify units)  10-11oz    Fiber  30 grams    Whole Grain Foods  3 servings   chooses multigrain low sodium bread   Saturated Fats  13 max. grams    Fruits and Vegetables  5 servings/day   8 ideal; eats  fruits and vegetables regularly but eats only small portions   Sodium  1500 grams      Personal Nutrition Goals   Nutrition Goal  Eat breakfast an hour later on the mornings you are coming to class to exercise. This will help prevent episodes of low blood sugar intra/post exertion    Personal Goal #2  Limit foods high in sodium and potassium. Use the packet provided as a guide    Personal Goal #3  When eating out, ask for gravies  and sauces on the side, order non-fried items, and/or ask for no extra salt to be added to meals    Comments  He and his wife have been trying to minimize sodium intake, but they eat out frequently. They choose low sodium breakfast meats, bread, and canned items for meals at home. Typically eats breakfast and dinner with a light lunch or mid afternoon snack. He is regaining his appetite s/p hospital and rehab stay but still eats small portions. His wife is trying to cook with salt-free herbs and spices but needs further instruction.      Intervention Plan   Intervention  Prescribe, educate and counsel regarding individualized specific dietary modifications aiming towards targeted core components such as weight, hypertension, lipid management, diabetes, heart failure and other comorbidities.;Nutrition handout(s) given to patient.   Salt free seasoning blends handout; DM type II and CKD nutrition therapy packet   Expected Outcomes  Short Term Goal: Understand basic principles of dietary content, such as calories, fat, sodium, cholesterol and nutrients.;Short Term Goal: A plan has been developed with personal nutrition goals set during dietitian appointment.;Long Term Goal: Adherence to prescribed nutrition plan.       Nutrition Assessments: Nutrition Assessments - 11/19/17 1258      MEDFICTS Scores   Pre Score  29       Nutrition Goals Re-Evaluation: Nutrition Goals Re-Evaluation    [place] Name 11/29/17 1255 12/25/17 1039           Goals   Nutrition Goal  When eating out, ask for gravies and sauces on the side, order non-fried items, and/or ask for no extra salt to be added to meals  When eating out ask for gravies and sauces on the side / order non-fried items/ or ask for no extra salt to be added to the food; eat breakfast an hour later on days you are coming to exercise to avoid hypoglycemia; limit food high in sodium as potassium      Comment  He and his wife eat out frequently, as they  enjoy this time together out of the house. He has been trying to watch his sodium intake however and knows that it is often difficult to monitor sodium when eating out. They choose restaurants over fast food  He and his wife have been working to make tweaks to their diet and eating patterns. They are eating out slightly less and states that his pertinent lab values are WNL. He feels he is doing well overall and feels that his appetite and weight are stable      Expected Outcome  He will make adjustments to his food orders in order to help decrease sodium content of meals when eating out  He will continue to make tweaks to his current eating pattern and maintain CBW +/- 5#        Personal Goal #2 Re-Evaluation   Personal Goal #2  Eat breakfast an hour later on mornings you are coming to class  to exercise. This will help prevent episodes of low blood sugar intra/post exertion  -        Personal Goal #3 Re-Evaluation   Personal Goal #3  Limit foods high in sodium and potassium. Use the packet provided as a guide  -         Nutrition Goals Discharge (Final Nutrition Goals Re-Evaluation): Nutrition Goals Re-Evaluation - 12/25/17 1039      Goals   Nutrition Goal  When eating out ask for gravies and sauces on the side / order non-fried items/ or ask for no extra salt to be added to the food; eat breakfast an hour later on days you are coming to exercise to avoid hypoglycemia; limit food high in sodium as potassium    Comment  He and his wife have been working to make tweaks to their diet and eating patterns. They are eating out slightly less and states that his pertinent lab values are WNL. He feels he is doing well overall and feels that his appetite and weight are stable    Expected Outcome  He will continue to make tweaks to his current eating pattern and maintain CBW +/- 5#       Psychosocial: Target Goals: Acknowledge presence or absence of significant depression and/or stress, maximize coping  skills, provide positive support system. Participant is able to verbalize types and ability to use techniques and skills needed for reducing stress and depression.   Initial Review & Psychosocial Screening: Initial Psych Review & Screening - 11/19/17 1254      Initial Review   Current issues with  Current Sleep Concerns;Current Stress Concerns   Silvia reported sleeping decent before his heart attack. After staying in the hospital for 2 weeks and then in rehab for another 2 weeks, his sleep schedule got messed up. He is taking melatonin about every night, some nights it helps others not so much.    Source of Stress Concerns  Chronic Illness    Comments  Kamon has the beginning dementia. He feels like he has a pretty good grasp on the process and is doing everything he can to help himself. He also was put on dialysis during his hospital stay, but was taken off by discharge. He is very pleased with how his kidney function and A1C has improved in the last couple weeks. He and his wife love to travel so he is focusing on getting stronger so they can continue.       Family Dynamics   Good Support System?  Yes   wife and sons     Barriers   Psychosocial barriers to participate in program  There are no identifiable barriers or psychosocial needs.;The patient should benefit from training in stress management and relaxation.      Screening Interventions   Interventions  Encouraged to exercise;Program counselor consult;To provide support and resources with identified psychosocial needs;Provide feedback about the scores to participant    Expected Outcomes  Short Term goal: Utilizing psychosocial counselor, staff and physician to assist with identification of specific Stressors or current issues interfering with healing process. Setting desired goal for each stressor or current issue identified.;Long Term Goal: Stressors or current issues are controlled or eliminated.;Short Term goal: Identification and review  with participant of any Quality of Life or Depression concerns found by scoring the questionnaire.;Long Term goal: The participant improves quality of Life and PHQ9 Scores as seen by post scores and/or verbalization of changes       Quality  of Life Scores:  Quality of Life - 11/19/17 1056      Quality of Life   Select  Quality of Life      Quality of Life Scores   Health/Function Pre  22.85 %    Socioeconomic Pre  29.17 %    Psych/Spiritual Pre  24.64 %    Family Pre  22.8 %    GLOBAL Pre  25.44 %      Scores of 19 and below usually indicate a poorer quality of life in these areas.  A difference of  2-3 points is a clinically meaningful difference.  A difference of 2-3 points in the total score of the Quality of Life Index has been associated with significant improvement in overall quality of life, self-image, physical symptoms, and general health in studies assessing change in quality of life.  PHQ-9: Recent Review Flowsheet Data    Depression screen [facility] 2/9 12/27/2017 11/19/2017 06/05/2017 06/05/2017 02/26/2017   Decreased Interest 1 2 0 0 0   Down, Depressed, Hopeless 1 0 0 0 0   PHQ - 2 Score 2 2 0 0 0   Altered sleeping 1 3 - - -   Tired, decreased energy 0 3 - - -   Change in appetite 0 1 - - -   Feeling bad or failure about yourself  0 1 - - -   Trouble concentrating 0 0 - - -   Moving slowly or fidgety/restless 0 0 - - -   Suicidal thoughts 0 0 - - -   PHQ-9 Score 3 10 - - -   Difficult doing work/chores Somewhat difficult Somewhat difficult - - -     Interpretation of Total Score  Total Score Depression Severity:  1-4 = Minimal depression, 5-9 = Mild depression, 10-14 = Moderate depression, 15-19 = Moderately severe depression, 20-27 = Severe depression   Psychosocial Evaluation and Intervention: Psychosocial Evaluation - 12/20/17 0951      Psychosocial Evaluation & Interventions   Interventions  Relaxation education;Encouraged to exercise with the program and follow  exercise prescription    Comments  Counselor met with Ray Lamb) today for initial psychosocial evaluation.  He is an 84 year old who had a heart attack and stent inserted this past August 17th.  He reports having kidney failure - but it is improving since the stent was placed.  He mentioned having been diagnosed recently with "mild dementia" and is on medication for this - and reports the medication being helpful.  Ray Lamb has a strong support system with a spouse of 75 years and (2) sons.  He also has grandchildren and great grandchildren who "light up his life" as he talked about them.  Ray Lamb reports not sleeping well for quite some time, but especially since the procedure in August.  He may speak with his Dr. about ordering a sleep study.  He reports his appetite is improving as of late.  Holbert denies a history of depression or anxiety or any current symptoms and is typically in a positive mood.  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  Jaycee has goals to walk better and "get rid of this cane" while in this program.  Staff will follow with him.     Expected Outcomes  Short:  Kiven will exercise regularly to increase his stamina and strength and improve his core ability to walk better.  He will  also mention to his Dr. about the need for a sleep study to determine if there is a problem preventing him from sleeping well.   Long:  Ray Lamb will continue to practice positive self-care habits of exercise and healthy eating for his health and mental health.    Continue Psychosocial Services   Follow up required by staff       Psychosocial Re-Evaluation: Psychosocial Re-Evaluation    [place] Name 12/27/17 1020             Psychosocial Re-Evaluation   Current issues with  Current Stress Concerns;Current Sleep Concerns       Comments  Ray Lamb has been doing good mentally.  His biggest problem has been his sleep.  He has not talked to his doctor about it yet,  but has started to use his sleep app.   His has noticed that he has an inconsistent  pattern.  He will talk to his doctor about a sleep study as this has been going on since he got out of the hospital.  Reviewed patient health questionnaire (PHQ-9) with patient for follow up. Previously, patients score indicated signs/symptoms of depression.  Reviewed to see if patient is improving symptom wise while in program.  Score improved and patient states that it is because they have been more active and coming to class.        Expected Outcomes  Short: Talk to doctor about sleep study.  Long: Continue to exercise and work on sleeping better.           Psychosocial Discharge (Final Psychosocial Re-Evaluation): Psychosocial Re-Evaluation - 12/27/17 1020      Psychosocial Re-Evaluation   Current issues with  Current Stress Concerns;Current Sleep Concerns    Comments  Ray Lamb has been doing good mentally.  His biggest problem has been his sleep.  He has not talked to his doctor about it yet, but has started to use his sleep app.   His has noticed that he has an inconsistent  pattern.  He will talk to his doctor about a sleep study as this has been going on since he got out of the hospital.  Reviewed patient health questionnaire (PHQ-9) with patient for follow up. Previously, patients score indicated signs/symptoms of depression.  Reviewed to see if patient is improving symptom wise while in program.  Score improved and patient states that it is because they have been more active and coming to class.     Expected Outcomes  Short: Talk to doctor about sleep study.  Long: Continue to exercise and work on sleeping better.        Vocational Rehabilitation: Provide vocational rehab assistance to qualifying candidates.   Vocational Rehab Evaluation & Intervention: Vocational Rehab - 11/19/17 1254      Initial Vocational Rehab Evaluation & Intervention   Assessment shows need for Vocational Rehabilitation  No        Education: Education Goals: Education classes will be provided on a variety of topics geared toward better understanding of heart health and risk factor modification. Participant will state understanding/return demonstration of topics presented as noted by education test scores.  Learning Barriers/Preferences: Learning Barriers/Preferences - 11/19/17 1254      Learning Barriers/Preferences   Learning Barriers  Hearing    Learning Preferences  Verbal Instruction;Written Material       Education Topics:  AED/CPR: - Group verbal and written instruction with the use of models to demonstrate the basic use of the AED with the basic ABC's  of resuscitation.   Cardiac Rehab from 01/15/2018 in [facility] Cardiac and Pulmonary Rehab  Date  12/13/17  Educator  SB  Instruction Review Code  1- Verbalizes Understanding      General Nutrition Guidelines/Fats and Fiber: -Group instruction provided by verbal, written material, models and posters to present the general guidelines for heart healthy nutrition. Gives an explanation and review of dietary fats and fiber.   Cardiac Rehab from 01/15/2018 in [facility] Cardiac and Pulmonary Rehab  Date  12/18/17  Educator  LB  Instruction Review Code  1- Verbalizes Understanding      Controlling Sodium/Reading Food Labels: -Group verbal and written material supporting the discussion of sodium use in heart healthy nutrition. Review and explanation with models, verbal and written materials for utilization of the food label.   Cardiac Rehab from 01/15/2018 in [facility] Cardiac and Pulmonary Rehab  Date  12/20/17  Educator  LB  Instruction Review Code  1- Verbalizes Understanding      Exercise Physiology & General Exercise Guidelines: - Group verbal and written instruction with models to review the exercise physiology of the cardiovascular system and associated critical values. Provides general exercise guidelines with specific guidelines to those with heart or lung  disease.    Cardiac Rehab from 01/15/2018 in [facility] Cardiac and Pulmonary Rehab  Date  01/01/18  Educator  [facility]  Instruction Review Code  1- Verbalizes Understanding      Aerobic Exercise & Resistance Training: - Gives group verbal and written instruction on the various components of exercise. Focuses on aerobic and resistive training programs and the benefits of this training and how to safely progress through these programs..   Flexibility, Balance, Mind/Body Relaxation: Provides group verbal/written instruction on the benefits of flexibility and balance training, including mind/body exercise modes such as yoga, pilates and tai chi.  Demonstration and skill practice provided.   Cardiac Rehab from 01/15/2018 in [facility] Cardiac and Pulmonary Rehab  Date  01/08/18  Educator  AS  Instruction Review Code  1- Verbalizes Understanding      Stress and Anxiety: - Provides group verbal and written instruction about the health risks of elevated stress and causes of high stress.  Discuss the correlation between heart/lung disease and anxiety and treatment options. Review healthy ways to manage with stress and anxiety.   Depression: - Provides group verbal and written instruction on the correlation between heart/lung disease and depressed mood, treatment options, and the stigmas associated with seeking treatment.   Anatomy & Physiology of the Heart: - Group verbal and written instruction and models provide basic cardiac anatomy and physiology, with the coronary electrical and arterial systems. Review of Valvular disease and Heart Failure   Cardiac Rehab from 01/15/2018 in [facility] Cardiac and Pulmonary Rehab  Date  11/29/17  Educator  CE  Instruction Review Code  1- Verbalizes Understanding      Cardiac Procedures: - Group verbal and written instruction to review commonly prescribed medications for heart disease. Reviews the medication, class of the drug, and side effects. Includes the steps to  properly store meds and maintain the prescription regimen. (beta blockers and nitrates)   Cardiac Medications I: - Group verbal and written instruction to review commonly prescribed medications for heart disease. Reviews the medication, class of the drug, and side effects. Includes the steps to properly store meds and maintain the prescription regimen.   Cardiac Rehab from 01/15/2018 in [facility] Cardiac and Pulmonary Rehab  Date  12/04/17  Educator  SB  Instruction Review Code  1- Verbalizes Understanding      Cardiac Medications II: -Group verbal and written instruction to review commonly prescribed medications for heart disease. Reviews the medication, class of the drug, and side effects. (all other drug classes)   Cardiac Rehab from 01/15/2018 in [facility] Cardiac and Pulmonary Rehab  Date  01/15/18  Educator  SB  Instruction Review Code  1- Verbalizes Understanding       Go Sex-Intimacy & Heart Disease, Get SMART - Goal Setting: - Group verbal and written instruction through game format to discuss heart disease and the return to sexual intimacy. Provides group verbal and written material to discuss and apply goal setting through the application of the S.M.A.R.T. Method.   Other Matters of the Heart: - Provides group verbal, written materials and models to describe Stable Angina and Peripheral Artery. Includes description of the disease process and treatment options available to the cardiac patient.   Cardiac Rehab from 01/15/2018 in [facility] Cardiac and Pulmonary Rehab  Date  11/29/17  Educator  CE  Instruction Review Code  1- Verbalizes Understanding      Exercise & Equipment Safety: - Individual verbal instruction and demonstration of equipment use and safety with use of the equipment.   Cardiac Rehab from 01/15/2018 in [facility] Cardiac and Pulmonary Rehab  Date  11/19/17  Educator  [facility]  Instruction Review Code  1- Verbalizes Understanding      Infection Prevention: - Provides verbal and  written material to individual with discussion of infection control including proper hand washing and proper equipment cleaning during exercise session.   Cardiac Rehab from 01/15/2018 in [facility] Cardiac and Pulmonary Rehab  Date  11/19/17  Educator  [facility]  Instruction Review Code  1- Verbalizes Understanding      Falls Prevention: - Provides verbal and written material to individual with discussion of falls prevention and safety.   Cardiac Rehab from 01/15/2018 in [facility] Cardiac and Pulmonary Rehab  Date  11/19/17  Educator  [facility]  Instruction Review Code  1- Verbalizes Understanding      Diabetes: - Individual verbal and written instruction to review signs/symptoms of diabetes, desired ranges of glucose level fasting, after meals and with exercise. Acknowledge that pre and post exercise glucose checks will be done for 3 sessions at entry of program.   Cardiac Rehab from 01/15/2018 in [facility] Cardiac and Pulmonary Rehab  Date  11/19/17  Educator  [facility]  Instruction Review Code  1- Verbalizes Understanding      Know Your Numbers and Risk Factors: -Group verbal and written instruction about important numbers in your health.  Discussion of what are risk factors and how they play a role in the disease process.  Review of Cholesterol, Blood Pressure, Diabetes, and BMI and the role they play in your overall health.   Cardiac Rehab from 01/15/2018 in [facility] Cardiac and Pulmonary Rehab  Date  01/15/18  Educator  SB  Instruction Review Code  1- Verbalizes Understanding      Sleep Hygiene: -Provides group verbal and written instruction about how sleep can affect your health.  Define sleep hygiene, discuss sleep cycles and impact of sleep habits. Review good sleep hygiene tips.    Other: -Provides group and verbal instruction on various topics (see comments)   Knowledge Questionnaire Score: Knowledge Questionnaire Score - 11/19/17 1057      Knowledge Questionnaire Score   Pre Score  19/26   Test  reviewed with pt today.  Education Focus: A&P, Angina, Nutrition, Exercise, and Tobacco  Core Components/Risk Factors/Patient Goals at Admission: Personal Goals and Risk Factors at Admission - 11/19/17 1253      Core Components/Risk Factors/Patient Goals on Admission    Weight Management  Yes;Weight Maintenance    Intervention  Weight Management: Develop a combined nutrition and exercise program designed to reach desired caloric intake, while maintaining appropriate intake of nutrient and fiber, sodium and fats, and appropriate energy expenditure required for the weight goal.;Weight Management: Provide education and appropriate resources to help participant work on and attain dietary goals.    Admit Weight  153 lb (69.4 kg)   wants to stay between 150-155 lb   Expected Outcomes  Short Term: Continue to assess and modify interventions until short term weight is achieved;Long Term: Adherence to nutrition and physical activity/exercise program aimed toward attainment of established weight goal;Weight Maintenance: Understanding of the daily nutrition guidelines, which includes 25-35% calories from fat, 7% or less cal from saturated fats, less than 219m cholesterol, less than 1.5gm of sodium, & 5 or more servings of fruits and vegetables daily;Understanding recommendations for meals to include 15-35% energy as protein, 25-35% energy from fat, 35-60% energy from carbohydrates, less than 2033mof dietary cholesterol, 20-35 gm of total fiber daily;Understanding of distribution of calorie intake throughout the day with the consumption of 4-5 meals/snacks    Diabetes  Yes    Intervention  Provide education about signs/symptoms and action to take for hypo/hyperglycemia.;Provide education about proper nutrition, including hydration, and aerobic/resistive exercise prescription along with prescribed medications to achieve blood glucose in normal ranges: Fasting glucose 65-99 mg/dL    Expected Outcomes  Short  Term: Participant verbalizes understanding of the signs/symptoms and immediate care of hyper/hypoglycemia, proper foot care and importance of medication, aerobic/resistive exercise and nutrition plan for blood glucose control.;Long Term: Attainment of HbA1C < 7%.    Hypertension  Yes    Intervention  Provide education on lifestyle modifcations including regular physical activity/exercise, weight management, moderate sodium restriction and increased consumption of fresh fruit, vegetables, and low fat dairy, alcohol moderation, and smoking cessation.;Monitor prescription use compliance.    Expected Outcomes  Short Term: Continued assessment and intervention until BP is < 140/9022mG in hypertensive participants. < 130/61m55m in hypertensive participants with diabetes, heart failure or chronic kidney disease.;Long Term: Maintenance of blood pressure at goal levels.    Lipids  Yes    Intervention  Provide education and support for participant on nutrition & aerobic/resistive exercise along with prescribed medications to achieve LDL <70mg53mL >40mg.4mExpected Outcomes  Short Term: Participant states understanding of desired cholesterol values and is compliant with medications prescribed. Participant is following exercise prescription and nutrition guidelines.;Long Term: Cholesterol controlled with medications as prescribed, with individualized exercise RX and with personalized nutrition plan. Value goals: LDL < 70mg, 14m> 40 mg.       Core Components/Risk Factors/Patient Goals Review:  Goals and Risk Factor Review    Row Name 12/27/17 1022             Core Components/Risk Factors/Patient Goals Review   Personal Goals Review  Weight Management/Obesity;Hypertension;Diabetes;Lipids       Review  Terik isLeroyng well with his weight and has gained some of his strength back.  He is feeling better and watching his diet.  His kidney function numbers have started to improve as well.  He is doing well with  his blood sugars. His A1c is down to 6.2.  He checks them about every other  day at home.  He is has been checking his blood pressure  at home daily.  It has been good here, but he is getting higher reading at home. We talked about bringing his cuff in for [place] to compare to.  He is doing well with his medicaitons.        Expected Outcomes  Short: Bring blood pressure cuff in to check against what we are getting.  Long: Continue to work on maintaining weight and watching risk factors.           Core Components/Risk Factors/Patient Goals at Discharge (Final Review):  Goals and Risk Factor Review - 12/27/17 1022      Core Components/Risk Factors/Patient Goals Review   Personal Goals Review  Weight Management/Obesity;Hypertension;Diabetes;Lipids    Review  Rakan is doing well with his weight and has gained some of his strength back.  He is feeling better and watching his diet.  His kidney function numbers have started to improve as well.  He is doing well with his blood sugars. His A1c is down to 6.2.  He checks them about every other day at home.  He is has been checking his blood pressure  at home daily.  It has been good here, but he is getting higher reading at home. We talked about bringing his cuff in for [place] to compare to.  He is doing well with his medicaitons.     Expected Outcomes  Short: Bring blood pressure cuff in to check against what we are getting.  Long: Continue to work on maintaining weight and watching risk factors.        ITP Comments: ITP Comments    Row Name 11/19/17 1249 11/21/17 0645 12/19/17 0606 01/16/18 0616     ITP Comments  Med Review completed. Initial ITP created. Diagnosis can be found in [facility] Encounter 8/17  30 day review.  Continue with ITP unless directed changes per Medical Director review.  30 day review. Continue with ITP unless direccted changes per Medical Director Chart Review.  30 day review. Continue with ITP unless direccted changes per Medical Director Chart  Review.       Comments:

## 2018-01-17 ENCOUNTER — Encounter: Payer: Medicare Other | Admitting: *Deleted

## 2018-01-17 DIAGNOSIS — I214 Non-ST elevation (NSTEMI) myocardial infarction: Secondary | ICD-10-CM | POA: Diagnosis not present

## 2018-01-17 DIAGNOSIS — I129 Hypertensive chronic kidney disease with stage 1 through stage 4 chronic kidney disease, or unspecified chronic kidney disease: Secondary | ICD-10-CM | POA: Diagnosis not present

## 2018-01-17 DIAGNOSIS — N184 Chronic kidney disease, stage 4 (severe): Secondary | ICD-10-CM | POA: Diagnosis not present

## 2018-01-17 DIAGNOSIS — Z8673 Personal history of transient ischemic attack (TIA), and cerebral infarction without residual deficits: Secondary | ICD-10-CM | POA: Diagnosis not present

## 2018-01-17 DIAGNOSIS — E1122 Type 2 diabetes mellitus with diabetic chronic kidney disease: Secondary | ICD-10-CM | POA: Diagnosis not present

## 2018-01-17 DIAGNOSIS — Z955 Presence of coronary angioplasty implant and graft: Secondary | ICD-10-CM | POA: Diagnosis not present

## 2018-01-17 NOTE — Progress Notes (Signed)
Daily Session Note  Patient Details  Name: EDWEN MCLESTER MRN: [id number] Date of Birth: 28-May-1930 Referring Provider:     Cardiac Rehab from 11/19/2017 in [facility] Cardiac and Pulmonary Rehab  Referring Provider  Neoma Laming MD      Encounter Date: 01/17/2018  Check In: Session Check In - 01/17/18 0919      Check-In   Supervising physician immediately available to respond to emergencies  See telemetry face sheet for immediately available ER MD    Location  [facility]-Cardiac & Pulmonary Rehab    Staff Present  Alberteen Sam, MA, RCEP, CCRP, Exercise Physiologist;Jeanna Durrell BS, Exercise Physiologist;Leslie Castrejon RN, BSN;Carroll Enterkin, RN, BSN;Joseph Hood RCP,RRT,BSRT    Medication changes reported      No    Fall or balance concerns reported     No    Warm-up and Cool-down  Performed as group-led Higher education careers adviser Performed  Yes    VAD Patient?  No    PAD/SET Patient?  No      Pain Assessment   Currently in Pain?  No/denies    Multiple Pain Sites  No          Social History   Tobacco Use  Smoking Status Former Smoker  . Last attempt to quit: 09/02/1964  . Years since quitting: 53.4  Smokeless Tobacco Never Used    Goals Met:  Independence with exercise equipment Exercise tolerated well No report of cardiac concerns or symptoms Strength training completed today  Goals Unmet:  Not Applicable  Comments: Pt able to follow exercise prescription today without complaint.  Will continue to monitor for progression.    Dr. Emily Filbert is Medical Director for [place] and [facility].

## 2018-01-24 ENCOUNTER — Telehealth: Payer: Self-pay | Admitting: *Deleted

## 2018-01-24 NOTE — Telephone Encounter (Signed)
Ray Lamb has been out all week.  Called to check on him.  He has been in [place] on vacation with family.

## 2018-01-29 DIAGNOSIS — N184 Chronic kidney disease, stage 4 (severe): Secondary | ICD-10-CM | POA: Diagnosis not present

## 2018-01-29 DIAGNOSIS — I214 Non-ST elevation (NSTEMI) myocardial infarction: Secondary | ICD-10-CM

## 2018-01-29 DIAGNOSIS — I129 Hypertensive chronic kidney disease with stage 1 through stage 4 chronic kidney disease, or unspecified chronic kidney disease: Secondary | ICD-10-CM | POA: Diagnosis not present

## 2018-01-29 DIAGNOSIS — Z955 Presence of coronary angioplasty implant and graft: Secondary | ICD-10-CM | POA: Diagnosis not present

## 2018-01-29 DIAGNOSIS — E1122 Type 2 diabetes mellitus with diabetic chronic kidney disease: Secondary | ICD-10-CM | POA: Diagnosis not present

## 2018-01-29 DIAGNOSIS — Z8673 Personal history of transient ischemic attack (TIA), and cerebral infarction without residual deficits: Secondary | ICD-10-CM | POA: Diagnosis not present

## 2018-01-29 NOTE — Progress Notes (Signed)
Daily Session Note  Patient Details  Name: Ray Lamb MRN: [id number] Date of Birth: 1930-12-15 Referring Provider:     Cardiac Rehab from 11/19/2017 in [facility] Cardiac and Pulmonary Rehab  Referring Provider  Neoma Laming MD      Encounter Date: 01/29/2018  Check In: Session Check In - 01/29/18 0934      Check-In   Supervising physician immediately available to respond to emergencies  See telemetry face sheet for immediately available ER MD    Location  [facility]-Cardiac & Pulmonary Rehab    Staff Present  Darel Hong, RN BSN;Bradie Lacock BS, Exercise Physiologist;Jessica Luan Pulling, MA, RCEP, CCRP, Exercise Physiologist    Medication changes reported      No    Fall or balance concerns reported     No    Tobacco Cessation  No Change    Warm-up and Cool-down  Performed as group-led instruction    Resistance Training Performed  Yes    VAD Patient?  No    PAD/SET Patient?  No      Pain Assessment   Currently in Pain?  No/denies    Multiple Pain Sites  No          Social History   Tobacco Use  Smoking Status Former Smoker  . Last attempt to quit: 09/02/1964  . Years since quitting: 53.4  Smokeless Tobacco Never Used    Goals Met:  Independence with exercise equipment Exercise tolerated well No report of cardiac concerns or symptoms Strength training completed today  Goals Unmet:  Not Applicable  Comments: Pt able to follow exercise prescription today without complaint.  Will continue to monitor for progression.    Dr. Emily Filbert is Medical Director for [place] and [facility].

## 2018-01-31 DIAGNOSIS — Z955 Presence of coronary angioplasty implant and graft: Secondary | ICD-10-CM | POA: Diagnosis not present

## 2018-01-31 DIAGNOSIS — I129 Hypertensive chronic kidney disease with stage 1 through stage 4 chronic kidney disease, or unspecified chronic kidney disease: Secondary | ICD-10-CM | POA: Diagnosis not present

## 2018-01-31 DIAGNOSIS — N184 Chronic kidney disease, stage 4 (severe): Secondary | ICD-10-CM | POA: Diagnosis not present

## 2018-01-31 DIAGNOSIS — I214 Non-ST elevation (NSTEMI) myocardial infarction: Secondary | ICD-10-CM | POA: Diagnosis not present

## 2018-01-31 DIAGNOSIS — E1122 Type 2 diabetes mellitus with diabetic chronic kidney disease: Secondary | ICD-10-CM | POA: Diagnosis not present

## 2018-01-31 DIAGNOSIS — Z8673 Personal history of transient ischemic attack (TIA), and cerebral infarction without residual deficits: Secondary | ICD-10-CM | POA: Diagnosis not present

## 2018-01-31 NOTE — Progress Notes (Signed)
Daily Session Note  Patient Details  Name: Ray Lamb MRN: [id number] Date of Birth: Jul 09, 1930 Referring Provider:     Cardiac Rehab from 11/19/2017 in [facility] Cardiac and Pulmonary Rehab  Referring Provider  Neoma Laming MD      Encounter Date: 01/31/2018  Check In: Session Check In - 01/31/18 0916      Check-In   Supervising physician immediately available to respond to emergencies  See telemetry face sheet for immediately available ER MD    Location  [facility]-Cardiac & Pulmonary Rehab    Staff Present  Gerlene Burdock, RN, BSN;Leslie Castrejon RN, BSN;Sydnie Sigmund BS, Exercise Physiologist;Jessica Erie, MA, RCEP, CCRP, Exercise Physiologist    Medication changes reported      No    Fall or balance concerns reported     No    Tobacco Cessation  No Change    Warm-up and Cool-down  Performed as group-led instruction    Resistance Training Performed  Yes    VAD Patient?  No    PAD/SET Patient?  No      Pain Assessment   Currently in Pain?  No/denies          Social History   Tobacco Use  Smoking Status Former Smoker  . Last attempt to quit: 09/02/1964  . Years since quitting: 53.4  Smokeless Tobacco Never Used    Goals Met:  Independence with exercise equipment Exercise tolerated well No report of cardiac concerns or symptoms Strength training completed today  Goals Unmet:  Not Applicable  Comments: Pt able to follow exercise prescription today without complaint.  Will continue to monitor for progression.    Dr. Emily Filbert is Medical Director for [place] and [facility].

## 2018-02-07 VITALS — Ht 65.3 in | Wt 156.0 lb

## 2018-02-07 DIAGNOSIS — I214 Non-ST elevation (NSTEMI) myocardial infarction: Secondary | ICD-10-CM | POA: Diagnosis not present

## 2018-02-07 DIAGNOSIS — I129 Hypertensive chronic kidney disease with stage 1 through stage 4 chronic kidney disease, or unspecified chronic kidney disease: Secondary | ICD-10-CM | POA: Diagnosis not present

## 2018-02-07 DIAGNOSIS — E1122 Type 2 diabetes mellitus with diabetic chronic kidney disease: Secondary | ICD-10-CM | POA: Diagnosis not present

## 2018-02-07 DIAGNOSIS — Z955 Presence of coronary angioplasty implant and graft: Secondary | ICD-10-CM | POA: Diagnosis not present

## 2018-02-07 DIAGNOSIS — Z8673 Personal history of transient ischemic attack (TIA), and cerebral infarction without residual deficits: Secondary | ICD-10-CM | POA: Diagnosis not present

## 2018-02-07 DIAGNOSIS — N184 Chronic kidney disease, stage 4 (severe): Secondary | ICD-10-CM | POA: Diagnosis not present

## 2018-02-07 NOTE — Progress Notes (Signed)
Daily Session Note  Patient Details  Name: Ray Lamb MRN: [id number] Date of Birth: 04-19-1930 Referring Provider:     Cardiac Rehab from 11/19/2017 in [facility]  Referring Provider  Neoma Laming MD      Encounter Date: 02/07/2018  Check In:      Social History   Tobacco Use  Smoking Status Former Smoker  . Last attempt to quit: 09/02/1964  . Years since quitting: 53.4  Smokeless Tobacco Never Used    Goals Met:  Independence with exercise equipment Exercise tolerated well No report of cardiac concerns or symptoms Strength training completed today  Goals Unmet:  Not Applicable  Comments: Pt able to follow exercise prescription today without complaint.  Will continue to monitor for progression.  [name] Name 11/19/17 1219 02/07/18 1225       6 Minute Walk   Phase  Initial  Discharge    Distance  585 feet  870 feet    Distance % Change  -  48.7 %    Distance Feet Change  -  285 ft    Walk Time  6 minutes  6 minutes    # of Rest Breaks  0  0    MPH  1.11  1.21    METS  0.95  1.65    RPE  12  13    VO2 Peak  3.31  4.23    Symptoms  No  No    Resting HR  63 bpm  59 bpm    Resting BP  122/64  126/60    Resting Oxygen Saturation   100 %  -    Exercise Oxygen Saturation  during 6 min walk  100 %  -    Max Ex. HR  100 bpm  80 bpm    Max Ex. BP  134/76  132/64    2 Minute Post BP  124/64  -        Dr. Emily Filbert is Medical Director for [place] [facility].

## 2018-02-12 ENCOUNTER — Encounter: Payer: Self-pay | Admitting: *Deleted

## 2018-02-12 DIAGNOSIS — Z955 Presence of coronary angioplasty implant and graft: Secondary | ICD-10-CM | POA: Diagnosis not present

## 2018-02-12 DIAGNOSIS — I129 Hypertensive chronic kidney disease with stage 1 through stage 4 chronic kidney disease, or unspecified chronic kidney disease: Secondary | ICD-10-CM | POA: Diagnosis not present

## 2018-02-12 DIAGNOSIS — N184 Chronic kidney disease, stage 4 (severe): Secondary | ICD-10-CM | POA: Diagnosis not present

## 2018-02-12 DIAGNOSIS — I214 Non-ST elevation (NSTEMI) myocardial infarction: Secondary | ICD-10-CM

## 2018-02-12 DIAGNOSIS — E1122 Type 2 diabetes mellitus with diabetic chronic kidney disease: Secondary | ICD-10-CM | POA: Diagnosis not present

## 2018-02-12 DIAGNOSIS — Z8673 Personal history of transient ischemic attack (TIA), and cerebral infarction without residual deficits: Secondary | ICD-10-CM | POA: Diagnosis not present

## 2018-02-12 NOTE — Progress Notes (Signed)
Cardiac Individual Treatment Plan  Patient Details  Name: Ray Lamb MRN: [id number] Date of Birth: 03/02/1930 Referring Provider:     Cardiac Rehab from 11/19/2017 in [facility] Cardiac and Pulmonary Rehab  Referring Provider  Ray Laming MD      Initial Encounter Date:    Cardiac Rehab from 11/19/2017 in [facility] Cardiac and Pulmonary Rehab  Date  11/19/17      Visit Diagnosis: NSTEMI (non-ST elevated myocardial infarction) [facility])  Status post coronary artery stent placement  Patient's Home Medications on Admission:  Current Outpatient Medications:  .  atorvastatin (LIPITOR) 80 MG tablet, Take 1 tablet (80 mg total) by mouth at bedtime., Disp: 90 tablet, Rfl: 4 .  clopidogrel (PLAVIX) 75 MG tablet, Take 1 tablet (75 mg total) by mouth daily with breakfast., Disp: , Rfl:  .  donepezil (ARICEPT) 5 MG tablet, Take 5 mg by mouth at bedtime., Disp: , Rfl:  .  ezetimibe (ZETIA) 10 MG tablet, Take 1 tablet (10 mg total) by mouth daily., Disp: 90 tablet, Rfl: 4 .  furosemide (LASIX) 40 MG tablet, Take 40 mg by mouth daily as needed for fluid. , Disp: , Rfl:  .  glipiZIDE (GLUCOTROL) 5 MG tablet, Take 5 mg by mouth daily before breakfast., Disp: , Rfl:  .  hydrALAZINE (APRESOLINE) 25 MG tablet, , Disp: , Rfl:  .  isosorbide mononitrate (IMDUR) 30 MG 24 hr tablet, Take 30 mg by mouth daily. , Disp: , Rfl:  .  Melatonin 3 MG CAPS, Take 3 mg by mouth at bedtime. , Disp: , Rfl:  .  metoprolol tartrate (LOPRESSOR) 50 MG tablet, Take 1 tablet (50 mg total) by mouth every 12 (twelve) hours., Disp: , Rfl:  .  Multiple Vitamin (MULTIVITAMIN) tablet, Take 1 tablet by mouth daily., Disp: , Rfl:  .  NON FORMULARY, Diet order: Downgrade patient to dysphagia 2 diet, continue with thin liquids; NO straws, add extra gravy., Disp: , Rfl:  .  Nutritional Supplements (FEEDING SUPPLEMENT, NEPRO CARB STEADY,) LIQD, Take 237 mLs by mouth 3 (three) times daily between meals., Disp: , Rfl: 0 .  sitaGLIPtin (JANUVIA) 25  MG tablet, Take 25 mg by mouth daily., Disp: , Rfl:  .  timolol (TIMOPTIC) 0.5 % ophthalmic solution, Place 1 drop into both eyes daily. , Disp: , Rfl:  .  TURMERIC PO, Take 1,300 mg by mouth daily. , Disp: , Rfl:   Past Medical History: Past Medical History:  Diagnosis Date  . Anemia   . Chronic kidney disease    STAGE 4  . Coronary atherosclerosis    CABG X 4  . Diabetes mellitus without complication ([place])   . Edema    ankle  . GERD (gastroesophageal reflux disease)   . Glaucoma   . Hearing loss   . Heart murmur   . History of hiatal hernia   . Hyperlipidemia   . Hypertension   . Mini stroke ([place])   . Mitral insufficiency   . Myocardial infarction ([name])    mild, heart cath done no stents needed  . Stroke [facility]) 2011   TIA    Tobacco Use: Social History   Tobacco Use  Smoking Status Former Smoker  . Last attempt to quit: 09/02/1964  . Years since quitting: 53.4  Smokeless Tobacco Never Used    Labs: Recent Chemical engineer    Labs for ITP Cardiac and Pulmonary Rehab Latest Ref Rng & Units 09/30/2017 10/09/2017 10/10/2017 11/06/2017 12/05/2017   Cholestrol <200  mg/dL - - - - -   LDLCALC 0 - 99 mg/dL - - - - -   HDL >39 mg/dL - - - - -   Trlycerides <150 mg/dL - - - - -   Hemoglobin A1c <7.0 % - - - 6.8(Ray) 6.5   PHART 7.350 - 7.450 7.20(L) 7.47(Ray) 7.48(Ray) - -   PCO2ART 32.0 - 48.0 mmHg 37 45 45 - -   HCO3 20.0 - 28.0 mmol/L 14.5(L) 32.8(Ray) 33.5(Ray) - -   ACIDBASEDEF 0.0 - 2.0 mmol/L 12.6(Ray) - - - -   O2SAT % 91.0 91.1 97.9 - -       Exercise Target Goals: Exercise Program Goal: Individual exercise prescription set using results from initial 6 min walk test and THRR while considering  patient's activity barriers and safety.   Exercise Prescription Goal: Initial exercise prescription builds to 30-45 minutes a day of aerobic activity, 2-3 days per week.  Home exercise guidelines will be given to patient during program as part of exercise prescription that the  participant will acknowledge.  Activity Barriers & Risk Stratification: Activity Barriers & Cardiac Risk Stratification - 11/19/17 1220      Activity Barriers & Cardiac Risk Stratification   Activity Barriers  Decreased Ventricular Function;Deconditioning;Muscular Weakness;Balance Concerns;History of Falls;Assistive Device    Cardiac Risk Stratification  High       6 Minute Walk: 6 Minute Walk    Row Name 11/19/17 1219 02/07/18 1225       6 Minute Walk   Phase  Initial  Discharge    Distance  585 feet  870 feet    Distance % Change  -  48.7 %    Distance Feet Change  -  285 ft    Walk Time  6 minutes  6 minutes    # of Rest Breaks  0  0    MPH  1.11  1.21    METS  0.95  1.65    RPE  12  13    VO2 Peak  3.31  4.23    Symptoms  No  No    Resting HR  63 bpm  59 bpm    Resting BP  122/64  126/60    Resting Oxygen Saturation   100 %  -    Exercise Oxygen Saturation  during 6 min walk  100 %  -    Max Ex. HR  100 bpm  80 bpm    Max Ex. BP  134/76  132/64    2 Minute Post BP  124/64  -       Oxygen Initial Assessment:   Oxygen Re-Evaluation:   Oxygen Discharge (Final Oxygen Re-Evaluation):   Initial Exercise Prescription: Initial Exercise Prescription - 11/19/17 1200      Date of Initial Exercise RX and Referring Provider   Date  11/19/17    Referring Provider  Ray Laming MD      Treadmill   MPH  0.8    Grade  0.5    Minutes  15    METs  1.7      Recumbant Bike   Level  1    RPM  50    Watts  10    Minutes  15    METs  1.7      NuStep   Level  1    SPM  80    Minutes  15    METs  1.7      Prescription  Details   Frequency (times per week)  2    Duration  Progress to 30 minutes of continuous aerobic without signs/symptoms of physical distress      Intensity   THRR 40-80% of Max Heartrate  92-121    Ratings of Perceived Exertion  11-13    Perceived Dyspnea  0-4      Progression   Progression  Continue to progress workloads to maintain  intensity without signs/symptoms of physical distress.      Resistance Training   Training Prescription  Yes    Weight  3 lbs    Reps  10-15       Perform Capillary Blood Glucose checks as needed.  Exercise Prescription Changes: Exercise Prescription Changes    Row Name 11/19/17 1200 12/13/17 1000 12/25/17 1600 01/07/18 1600 01/23/18 1400     Response to Exercise   Blood Pressure (Admit)  122/64  -  124/74  124/70  138/60   Blood Pressure (Exercise)  134/76  -  144/68  144/58  138/82   Blood Pressure (Exit)  124/64  -  136/60  104/54  128/56   Heart Rate (Admit)  63 bpm  -  91 bpm  68 bpm  56 bpm   Heart Rate (Exercise)  100 bpm  -  92 bpm  91 bpm  76 bpm   Heart Rate (Exit)  63 bpm  -  62 bpm  65 bpm  58 bpm   Oxygen Saturation (Admit)  100 %  -  -  -  -   Oxygen Saturation (Exercise)  100 %  -  -  -  -   Rating of Perceived Exertion (Exercise)  12  -  13  13  12    Symptoms  none  -  none  none  none   Comments  walk test results  -  -  -  -   Duration  -  -  Continue with 30 min of aerobic exercise without signs/symptoms of physical distress.  Continue with 30 min of aerobic exercise without signs/symptoms of physical distress.  Continue with 30 min of aerobic exercise without signs/symptoms of physical distress.   Intensity  -  -  THRR unchanged  THRR unchanged  THRR unchanged     Progression   Progression  -  -  Continue to progress workloads to maintain intensity without signs/symptoms of physical distress.  Continue to progress workloads to maintain intensity without signs/symptoms of physical distress.  Continue to progress workloads to maintain intensity without signs/symptoms of physical distress.   Average METs  -  -  2.55  2.43  2.67     Resistance Training   Training Prescription  -  -  Yes  Yes  Yes   Weight  -  -  3 lbs  3 lbs  3 lbs   Reps  -  -  10-15  10-15  10-15     Interval Training   Interval Training  -  -  No  No  No     Treadmill   MPH  -  -  0.9   1.2  1.2   Grade  -  -  0.5  0.5  0.5   Minutes  -  -  15  15  15    METs  -  -  1.75  2  2     Recumbant Bike   Level  -  -  3  3  5  Watts  -  -  22  20  20    Minutes  -  -  15  15  15    METs  -  -  2.9  2.9  2.9     NuStep   Level  -  -  5  5  5    Minutes  -  -  15  15  15    METs  -  -  3  2.4  1.9     Home Exercise Plan   Plans to continue exercise at  [facility] (comment) treadmill, recumbent bike, and Nustep at the [employer] (comment) treadmill, recumbent bike, and Nustep at the [employer] (comment) treadmill, recumbent bike, and Nustep at the [employer] (comment) treadmill, recumbent bike, and Nustep at the [employer]  -  Add 2 additional days to program exercise sessions.  Add 2 additional days to program exercise sessions.  Add 2 additional days to program exercise sessions.  Add 2 additional days to program exercise sessions.   Initial Home Exercises Provided  -  12/13/17  12/13/17  12/13/17  12/13/17   Row Name 02/05/18 1000             Response to Exercise   Blood Pressure (Admit)  126/58       Blood Pressure (Exercise)  136/68       Blood Pressure (Exit)  128/60       Heart Rate (Admit)  49 bpm       Heart Rate (Exercise)  75 bpm       Heart Rate (Exit)  71 bpm       Rating of Perceived Exertion (Exercise)  12       Symptoms  none       Duration  Continue with 30 min of aerobic exercise without signs/symptoms of physical distress.       Intensity  THRR unchanged         Progression   Progression  Continue to progress workloads to maintain intensity without signs/symptoms of physical distress.       Average METs  2.47         Resistance Training   Training Prescription  Yes       Weight  3 lbs       Reps  10-15         Interval Training   Interval Training  No         Treadmill   MPH  1.2       Grade  0.5       Minutes  15       METs  2         Recumbant Bike   Level  5       Watts  18        Minutes  15       METs  2.9         NuStep   Level  5       Minutes  15       METs  2.5         Home Exercise Plan   Plans to continue exercise at  [employer] (comment) treadmill, recumbent bike, and Nustep at the [employer]  Add 2 additional days to program exercise sessions.       Initial Home Exercises Provided  12/13/17  Exercise Comments: Exercise Comments    Row Name 11/29/17 [phone]           Exercise Comments  First full day of exercise!  Patient was oriented to gym and equipment including functions, settings, policies, and procedures.  Patient's individual exercise prescription and treatment plan were reviewed.  All starting workloads were established based on the results of the 6 minute walk test done at initial orientation visit.  The plan for exercise progression was also introduced and progression will be customized based on patient's performance and goals.          Exercise Goals and Review: Exercise Goals    Row Name 11/19/17 1226             Exercise Goals   Increase Physical Activity  Yes       Intervention  Provide advice, education, support and counseling about physical activity/exercise needs.;Develop an individualized exercise prescription for aerobic and resistive training based on initial evaluation findings, risk stratification, comorbidities and participant's personal goals.       Expected Outcomes  Short Term: Attend rehab on a regular basis to increase amount of physical activity.;Long Term: Add in home exercise to make exercise part of routine and to increase amount of physical activity.;Long Term: Exercising regularly at least 3-5 days a week.       Increase Strength and Stamina  Yes       Intervention  Provide advice, education, support and counseling about physical activity/exercise needs.;Develop an individualized exercise prescription for aerobic and resistive training based on initial evaluation findings, risk  stratification, comorbidities and participant's personal goals.       Expected Outcomes  Short Term: Increase workloads from initial exercise prescription for resistance, speed, and METs.;Short Term: Perform resistance training exercises routinely during rehab and add in resistance training at home;Long Term: Improve cardiorespiratory fitness, muscular endurance and strength as measured by increased METs and functional capacity (6MWT)       Able to understand and use rate of perceived exertion (RPE) scale  Yes       Intervention  Provide education and explanation on how to use RPE scale       Expected Outcomes  Short Term: Able to use RPE daily in rehab to express subjective intensity level;Long Term:  Able to use RPE to guide intensity level when exercising independently       Knowledge and understanding of Target Heart Rate Range (THRR)  Yes       Intervention  Provide education and explanation of THRR including how the numbers were predicted and where they are located for reference       Expected Outcomes  Short Term: Able to state/look up THRR;Short Term: Able to use daily as guideline for intensity in rehab;Long Term: Able to use THRR to govern intensity when exercising independently       Able to check pulse independently  Yes       Intervention  Provide education and demonstration on how to check pulse in carotid and radial arteries.;Review the importance of being able to check your own pulse for safety during independent exercise       Expected Outcomes  Short Term: Able to explain why pulse checking is important during independent exercise;Long Term: Able to check pulse independently and accurately       Understanding of Exercise Prescription  Yes       Intervention  Provide education, explanation, and written materials on patient's individual exercise prescription  Expected Outcomes  Short Term: Able to explain program exercise prescription;Long Term: Able to explain home exercise  prescription to exercise independently          Exercise Goals Re-Evaluation : Exercise Goals Re-Evaluation    Row Name 11/29/17 0926 12/11/17 1546 12/13/17 1010 12/25/17 1631 12/27/17 1018     Exercise Goal Re-Evaluation   Exercise Goals Review  Increase Physical Activity;Increase Strength and Stamina;Able to understand and use rate of perceived exertion (RPE) scale;Knowledge and understanding of Target Heart Rate Range (THRR);Understanding of Exercise Prescription  Increase Physical Activity;Increase Strength and Stamina;Understanding of Exercise Prescription  Increase Physical Activity;Able to understand and use rate of perceived exertion (RPE) scale;Knowledge and understanding of Target Heart Rate Range (THRR);Increase Strength and Stamina;Able to check pulse independently;Understanding of Exercise Prescription  Increase Physical Activity;Increase Strength and Stamina;Understanding of Exercise Prescription  Increase Physical Activity;Increase Strength and Stamina;Understanding of Exercise Prescription   Comments  Reviewed RPE scale, THR and program prescription with pt today.  Pt voiced understanding and was given a copy of goals to take home.  Ray Lamb is off to a good start in rehab.  He is already up to level 2 on the recumbent bike.  We will continue monitor his progression.   Reviewed home exercise with pt today.  Pt plans to exercise at [facility] using the treadmill, recumbent bike, NuStep and hand weights for exercise.  Reviewed THR, pulse, RPE, sign and symptoms, NTG use, and when to call 911 or MD.  Also discussed weather considerations and indoor options.  Pt voiced understanding.  Ray Lamb has been doing well in rehab.  He is up to 22 watts at level 3 on the recumbent bike.  We will continue to monitor progression.   Ray Lamb is doing well in rehab.  He is feeling pretty good with his exercise.  He is starting to feel stronger and has more stamina.  He has even started to leave his cane in one spot while  in class.  He continues to go gym on his off days  for 5 days a week and does 87mn at the gym usually treadmill and NuStep.    Expected Outcomes  Short: Use RPE daily to regulate intensity. Long: Follow program prescription in THR.  Short: Review home exercise guidelines  Long: Continue increase strength and stamina  Short: HStan[place]is going to add 1-2 days and eventually 3 days of exercise at [employer] [facility], recumbent bike, Nustep, and hand weights. Long: Continue exercising outside of class and walking 30 minutes on the treadmill without stopping.   Short: Continue to push him to do more.  Long: Continue to exercise on his own on off days.   Short: Increase treadmill at gym like he did here.  Long: Continue to exercise on his own.    [place] 01/07/18 1658 01/17/18 1032 02/05/18 1050         Exercise Goal Re-Evaluation   Exercise Goals Review  Increase Physical Activity;Increase Strength and Stamina;Understanding of Exercise Prescription  Increase Physical Activity;Increase Strength and Stamina;Able to understand and use rate of perceived exertion (RPE) scale;Knowledge and understanding of Target Heart Rate Range (THRR);Able to check pulse independently  Increase Physical Activity;Increase Strength and Stamina;Understanding of Exercise Prescription     Comments  Ray Lamb doing well in rehab.  He is up to 1.2 mph on the treadmill now.  We will continue to monitor his progress.   HColebyis doing well in rehab. He has increased his  work load on the Nustep to Level 3. We will continue to monitor his progress. He is going to the gym 2 extra days. His cardiologist is going to do a stress test in February.  Ray Lamb continues to do well in rehab.  He is is up to level 5 on the bike and doing the same at the gym.  We will will continue to monitor his progress.      Expected Outcomes  Short: Continue to walk more. Long: Continue to increase strength and stamina.   Short: Continue to increase home exercise.  Long: Continue to increase work loads and graduate from Cardiac Rehab but continue exercising beyond that.  Short: Try to increase speed on treadmill.  Long: Continue to exercise on off days.         Discharge Exercise Prescription (Final Exercise Prescription Changes): Exercise Prescription Changes - 02/05/18 1000      Response to Exercise   Blood Pressure (Admit)  126/58    Blood Pressure (Exercise)  136/68    Blood Pressure (Exit)  128/60    Heart Rate (Admit)  49 bpm    Heart Rate (Exercise)  75 bpm    Heart Rate (Exit)  71 bpm    Rating of Perceived Exertion (Exercise)  12    Symptoms  none    Duration  Continue with 30 min of aerobic exercise without signs/symptoms of physical distress.    Intensity  THRR unchanged      Progression   Progression  Continue to progress workloads to maintain intensity without signs/symptoms of physical distress.    Average METs  2.47      Resistance Training   Training Prescription  Yes    Weight  3 lbs    Reps  10-15      Interval Training   Interval Training  No      Treadmill   MPH  1.2    Grade  0.5    Minutes  15    METs  2      Recumbant Bike   Level  5    Watts  18    Minutes  15    METs  2.9      NuStep   Level  5    Minutes  15    METs  2.5      Home Exercise Plan   Plans to continue exercise at  [employer] [facility] [employer] (comment)   treadmill, recumbent bike, and Nustep at the [employer]  Add 2 additional days to program exercise sessions.    Initial Home Exercises Provided  12/13/17       Nutrition:  Target Goals: Understanding of nutrition guidelines, daily intake of sodium <1566m, cholesterol <2051m calories 30% from fat and 7% or less from saturated fats, daily to have 5 or more servings of fruits and vegetables.  Biometrics: Pre Biometrics - 11/19/17 1227      Pre Biometrics   Height  5' 5.3" (1.659 m)    Weight  153 lb 8 oz (69.6 kg)    Waist Circumference  35 inches    Hip Circumference  39 inches     Waist to Hip Ratio  0.9 %    BMI (Calculated)  25.3    Single Leg Stand  1.58 seconds      Post Biometrics - 02/07/18 1226       Post  Biometrics   Height  5' 5.3" (1.659 m)    Weight  156 lb (70.8 kg)    Waist Circumference  35 inches    Hip Circumference  39 inches    Waist to Hip Ratio  0.9 %    BMI (Calculated)  25.71    Single Leg Stand  1.38 seconds       Nutrition Therapy Plan and Nutrition Goals: Nutrition Therapy & Goals - 11/29/17 1249      Nutrition Therapy   Diet  DM    Drug/Food Interactions  Statins/Certain Fruits    Protein (specify units)  10-11oz    Fiber  30 grams    Whole Grain Foods  3 servings   chooses multigrain low sodium bread   Saturated Fats  13 max. grams    Fruits and Vegetables  5 servings/day   8 ideal; eats fruits and vegetables regularly but eats only small portions   Sodium  1500 grams      Personal Nutrition Goals   Nutrition Goal  Eat breakfast an hour later on the mornings you are coming to class to exercise. This will help prevent episodes of low blood sugar intra/post exertion    Personal Goal #2  Limit foods high in sodium and potassium. Use the packet provided as a guide    Personal Goal #3  When eating out, ask for gravies and sauces on the side, order non-fried items, and/or ask for no extra salt to be added to meals    Comments  He and his wife have been trying to minimize sodium intake, but they eat out frequently. They choose low sodium breakfast meats, bread, and canned items for meals at home. Typically eats breakfast and dinner with a light lunch or mid afternoon snack. He is regaining his appetite s/p hospital and rehab stay but still eats small portions. His wife is trying to cook with salt-free herbs and spices but needs further instruction.      Intervention Plan   Intervention  Prescribe, educate and counsel regarding individualized specific dietary modifications aiming towards targeted core components such as weight,  hypertension, lipid management, diabetes, heart failure and other comorbidities.;Nutrition handout(s) given to patient.   Salt free seasoning blends handout; DM type II and CKD nutrition therapy packet   Expected Outcomes  Short Term Goal: Understand basic principles of dietary content, such as calories, fat, sodium, cholesterol and nutrients.;Short Term Goal: A plan has been developed with personal nutrition goals set during dietitian appointment.;Long Term Goal: Adherence to prescribed nutrition plan.       Nutrition Assessments: Nutrition Assessments - 11/19/17 1258      MEDFICTS Scores   Pre Score  29       Nutrition Goals Re-Evaluation: Nutrition Goals Re-Evaluation    Row Name 11/29/17 1255 12/25/17 1039 01/29/18 1030         Goals   Nutrition Goal  When eating out, ask for gravies and sauces on the side, order non-fried items, and/or ask for no extra salt to be added to meals  When eating out ask for gravies and sauces on the side / order non-fried items/ or ask for no extra salt to be added to the food; eat breakfast an hour later on days you are coming to exercise to avoid hypoglycemia; limit food high in sodium as potassium  When eating out ask for gravies and sauces on the side, order non-fried items, ask for no extra salt to be added to the food; Eat breakfast an hour later on days you are coming to exercise  to avoid low blood sugar; limit food high in sodium and potassium     Comment  He and his wife eat out frequently, as they enjoy this time together out of the house. He has been trying to watch his sodium intake however and knows that it is often difficult to monitor sodium when eating out. They choose restaurants over fast food  He and his wife have been working to make tweaks to their diet and eating patterns. They are eating out slightly less and states that his pertinent lab values are WNL. He feels he is doing well overall and feels that his appetite and weight are stable   He and his wife are learning to make healthier choices when eating out with success. He states that his wife is even starting to make healthy choices too. He feels pleased with the changes he has made and feels that his appetite has improved slightly. He has been eating breakfast "a little" later than normal on class days, and also brings candy with him to class if he feels that his blood sugar is dropping     Expected Outcome  He will make adjustments to his food orders in order to help decrease sodium content of meals when eating out  He will continue to make tweaks to his current eating pattern and maintain CBW +/- 5#  Maintain CBW +/- 5#, continue to limit sodium and potassium prn and to make health-conscious food and beverage choices when eating out       Personal Goal #2 Re-Evaluation   Personal Goal #2  Eat breakfast an hour later on mornings you are coming to class to exercise. This will help prevent episodes of low blood sugar intra/post exertion  -  -       Personal Goal #3 Re-Evaluation   Personal Goal #3  Limit foods high in sodium and potassium. Use the packet provided as a guide  -  -        Nutrition Goals Discharge (Final Nutrition Goals Re-Evaluation): Nutrition Goals Re-Evaluation - 01/29/18 1030      Goals   Nutrition Goal  When eating out ask for gravies and sauces on the side, order non-fried items, ask for no extra salt to be added to the food; Eat breakfast an hour later on days you are coming to exercise to avoid low blood sugar; limit food high in sodium and potassium    Comment  He and his wife are learning to make healthier choices when eating out with success. He states that his wife is even starting to make healthy choices too. He feels pleased with the changes he has made and feels that his appetite has improved slightly. He has been eating breakfast "a little" later than normal on class days, and also brings candy with him to class if he feels that his blood sugar is  dropping    Expected Outcome  Maintain CBW +/- 5#, continue to limit sodium and potassium prn and to make health-conscious food and beverage choices when eating out       Psychosocial: Target Goals: Acknowledge presence or absence of significant depression and/or stress, maximize coping skills, provide positive support system. Participant is able to verbalize types and ability to use techniques and skills needed for reducing stress and depression.   Initial Review & Psychosocial Screening: Initial Psych Review & Screening - 11/19/17 1254      Initial Review   Current issues with  Current Sleep Concerns;Current Stress  Concerns   Ray Lamb reported sleeping decent before his heart attack. After staying in the hospital for 2 weeks and then in rehab for another 2 weeks, his sleep schedule got messed up. He is taking melatonin about every night, some nights it helps others not so much.    Source of Stress Concerns  Chronic Illness    Comments  Ray Lamb has the beginning dementia. He feels like he has a pretty good grasp on the process and is doing everything he can to help himself. He also was put on dialysis during his hospital stay, but was taken off by discharge. He is very pleased with how his kidney function and A1C has improved in the last couple weeks. He and his wife love to travel so he is focusing on getting stronger so they can continue.       Family Dynamics   Good Support System?  Yes   wife and sons     Barriers   Psychosocial barriers to participate in program  There are no identifiable barriers or psychosocial needs.;The patient should benefit from training in stress management and relaxation.      Screening Interventions   Interventions  Encouraged to exercise;Program counselor consult;To provide support and resources with identified psychosocial needs;Provide feedback about the scores to participant    Expected Outcomes  Short Term goal: Utilizing psychosocial counselor, staff and  physician to assist with identification of specific Stressors or current issues interfering with healing process. Setting desired goal for each stressor or current issue identified.;Long Term Goal: Stressors or current issues are controlled or eliminated.;Short Term goal: Identification and review with participant of any Quality of Life or Depression concerns found by scoring the questionnaire.;Long Term goal: The participant improves quality of Life and PHQ9 Scores as seen by post scores and/or verbalization of changes       Quality of Life Scores:  Quality of Life - 11/19/17 1056      Quality of Life   Select  Quality of Life      Quality of Life Scores   Health/Function Pre  22.85 %    Socioeconomic Pre  29.17 %    Psych/Spiritual Pre  24.64 %    Family Pre  22.8 %    GLOBAL Pre  25.44 %      Scores of 19 and below usually indicate a poorer quality of life in these areas.  A difference of  2-3 points is a clinically meaningful difference.  A difference of 2-3 points in the total score of the Quality of Life Index has been associated with significant improvement in overall quality of life, self-image, physical symptoms, and general health in studies assessing change in quality of life.  PHQ-9: Recent Review Flowsheet Data    Depression screen [facility] 2/9 12/27/2017 11/19/2017 06/05/2017 06/05/2017 02/26/2017   Decreased Interest 1 2 0 0 0   Down, Depressed, Hopeless 1 0 0 0 0   PHQ - 2 Score 2 2 0 0 0   Altered sleeping 1 3 - - -   Tired, decreased energy 0 3 - - -   Change in appetite 0 1 - - -   Feeling bad or failure about yourself  0 1 - - -   Trouble concentrating 0 0 - - -   Moving slowly or fidgety/restless 0 0 - - -   Suicidal thoughts 0 0 - - -   PHQ-9 Score 3 10 - - -   Difficult doing work/chores  Somewhat difficult Somewhat difficult - - -     Interpretation of Total Score  Total Score Depression Severity:  1-4 = Minimal depression, 5-9 = Mild depression, 10-14 = Moderate  depression, 15-19 = Moderately severe depression, 20-27 = Severe depression   Psychosocial Evaluation and Intervention: Psychosocial Evaluation - 12/20/17 0951      Psychosocial Evaluation & Interventions   Interventions  Relaxation education;Encouraged to exercise with the program and follow exercise prescription    Comments  Counselor met with [name] Ray Lamb) today for initial psychosocial evaluation.  He is an 84 year old who had a heart attack and stent inserted this past August 17th.  He reports having kidney failure - but it is improving since the stent was placed.  He mentioned having been diagnosed recently with "mild dementia" and is on medication for this - and reports the medication being helpful.  Jaiden has a strong support system with a spouse of 42 years and (2) sons.  He also has grandchildren and great grandchildren who "light up his life" as he talked about them.  Dominyck reports not sleeping well for quite some time, but especially since the procedure in August.  He may speak with his Dr. about ordering a sleep study.  He reports his appetite is improving as of late.  Casanova denies a history of depression or anxiety or any current symptoms and is typically in a positive mood.  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  Bodi has goals to walk better and "get rid of this cane" while in this program.  Staff will follow with him.     Expected Outcomes  Short:  Shanan will exercise regularly to increase his stamina and strength and improve his core ability to walk better.  He will also mention to his Dr. about the need for a sleep study to determine if there is a problem preventing him from sleeping well.   Long:  Donovon will continue to practice positive self-care habits of exercise and healthy eating for his health and mental health.    Continue Psychosocial Services   Follow up required by staff       Psychosocial  Re-Evaluation: Psychosocial Re-Evaluation    Ray Lamb Name 12/27/17 1020 01/17/18 1015           Psychosocial Re-Evaluation   Current issues with  Current Stress Concerns;Current Sleep Concerns  Current Stress Concerns      Comments  Ray Lamb has been doing good mentally.  His biggest problem has been his sleep.  He has not talked to his doctor about it yet, but has started to use his sleep app.   His has noticed that he has an inconsistent  pattern.  He will talk to his doctor about a sleep study as this has been going on since he got out of the hospital.  Reviewed patient health questionnaire (PHQ-9) with patient for follow up. Previously, patients score indicated signs/symptoms of depression.  Reviewed to see if patient is improving symptom wise while in program.  Score improved and patient states that it is because they have been more active and coming to class.   Ray Lamb reports some family problems, specifically his grandson's health. He is going to [place] to see him and try to help him. He reports that his individual stress level is good. Sleep is still a problem has been since hospitilzation. He has stopped taking  Melatonin because hedidn't like it. He is using app on his phone to track sleep. He has to get up to go to the bathroom as well. He is going to try deep breathing and counting to try to get himself back to sleep.      Expected Outcomes  Short: Talk to doctor about sleep study.  Long: Continue to exercise and work on sleeping better.   Short: Ray Lamb has not been to his PCP to ask about sleep study yet. He will when he goes. Long: Continue exercising and working on relaxing and getting back to sleep.         Psychosocial Discharge (Final Psychosocial Re-Evaluation): Psychosocial Re-Evaluation - 01/17/18 1015      Psychosocial Re-Evaluation   Current issues with  Current Stress Concerns    Comments  Ray Lamb reports some family problems, specifically his grandson's health. He is going to  [place] to see him and try to help him. He reports that his individual stress level is good. Sleep is still a problem has been since hospitilzation. He has stopped taking Melatonin because hedidn't like it. He is using app on his phone to track sleep. He has to get up to go to the bathroom as well. He is going to try deep breathing and counting to try to get himself back to sleep.    Expected Outcomes  Short: Ray Lamb has not been to his PCP to ask about sleep study yet. He will when he goes. Long: Continue exercising and working on relaxing and getting back to sleep.       Vocational Rehabilitation: Provide vocational rehab assistance to qualifying candidates.   Vocational Rehab Evaluation & Intervention: Vocational Rehab - 11/19/17 1254      Initial Vocational Rehab Evaluation & Intervention   Assessment shows need for Vocational Rehabilitation  No       Education: Education Goals: Education classes will be provided on a variety of topics geared toward better understanding of heart health and risk factor modification. Participant will state understanding/return demonstration of topics presented as noted by education test scores.  Learning Barriers/Preferences: Learning Barriers/Preferences - 11/19/17 1254      Learning Barriers/Preferences   Learning Barriers  Hearing    Learning Preferences  Verbal Instruction;Written Material       Education Topics:  AED/CPR: - Group verbal and written instruction with the use of models to demonstrate the basic use of the AED with the basic ABC's of resuscitation.   Cardiac Rehab from 01/31/2018 in [facility] Cardiac and Pulmonary Rehab  Date  12/13/17  Educator  SB  Instruction Review Code  1- Verbalizes Understanding      General Nutrition Guidelines/Fats and Fiber: -Group instruction provided by verbal, written material, models and posters to present the general guidelines for heart healthy nutrition. Gives an explanation and review of  dietary fats and fiber.   Cardiac Rehab from 01/31/2018 in [facility] Cardiac and Pulmonary Rehab  Date  12/18/17  Educator  LB  Instruction Review Code  1- Verbalizes Understanding      Controlling Sodium/Reading Food Labels: -Group verbal and written material supporting the discussion of sodium use in heart healthy nutrition. Review and explanation with models, verbal and written materials for utilization of the food label.   Cardiac Rehab from 01/31/2018 in [facility] Cardiac and Pulmonary Rehab  Date  12/20/17  Educator  LB  Instruction Review Code  1- Verbalizes Understanding      Exercise Physiology & General Exercise Guidelines: -  Group verbal and written instruction with models to review the exercise physiology of the cardiovascular system and associated critical values. Provides general exercise guidelines with specific guidelines to those with heart or lung disease.    Cardiac Rehab from 01/31/2018 in [facility] Cardiac and Pulmonary Rehab  Date  01/01/18  Educator  [facility]  Instruction Review Code  1- Verbalizes Understanding      Aerobic Exercise & Resistance Training: - Gives group verbal and written instruction on the various components of exercise. Focuses on aerobic and resistive training programs and the benefits of this training and how to safely progress through these programs..   Flexibility, Balance, Mind/Body Relaxation: Provides group verbal/written instruction on the benefits of flexibility and balance training, including mind/body exercise modes such as yoga, pilates and tai chi.  Demonstration and skill practice provided.   Cardiac Rehab from 01/31/2018 in [facility] Cardiac and Pulmonary Rehab  Date  01/08/18  Educator  AS  Instruction Review Code  1- Verbalizes Understanding      Stress and Anxiety: - Provides group verbal and written instruction about the health risks of elevated stress and causes of high stress.  Discuss the correlation between heart/lung disease and anxiety  and treatment options. Review healthy ways to manage with stress and anxiety.   Depression: - Provides group verbal and written instruction on the correlation between heart/lung disease and depressed mood, treatment options, and the stigmas associated with seeking treatment.   Cardiac Rehab from 01/31/2018 in [facility] Cardiac and Pulmonary Rehab  Date  01/17/18  Educator  [facility]  Instruction Review Code  1- Verbalizes Understanding      Anatomy & Physiology of the Heart: - Group verbal and written instruction and models provide basic cardiac anatomy and physiology, with the coronary electrical and arterial systems. Review of Valvular disease and Heart Failure   Cardiac Rehab from 01/31/2018 in [facility] Cardiac and Pulmonary Rehab  Date  11/29/17  Educator  CE  Instruction Review Code  1- Verbalizes Understanding      Cardiac Procedures: - Group verbal and written instruction to review commonly prescribed medications for heart disease. Reviews the medication, class of the drug, and side effects. Includes the steps to properly store meds and maintain the prescription regimen. (beta blockers and nitrates)   Cardiac Medications I: - Group verbal and written instruction to review commonly prescribed medications for heart disease. Reviews the medication, class of the drug, and side effects. Includes the steps to properly store meds and maintain the prescription regimen.   Cardiac Rehab from 01/31/2018 in [facility] Cardiac and Pulmonary Rehab  Date  01/29/18  Educator  KS  Instruction Review Code  1- Verbalizes Understanding      Cardiac Medications II: -Group verbal and written instruction to review commonly prescribed medications for heart disease. Reviews the medication, class of the drug, and side effects. (all other drug classes)   Cardiac Rehab from 01/31/2018 in [facility] Cardiac and Pulmonary Rehab  Date  01/15/18  Educator  SB  Instruction Review Code  1- Verbalizes Understanding       Go  Sex-Intimacy & Heart Disease, Get SMART - Goal Setting: - Group verbal and written instruction through game format to discuss heart disease and the return to sexual intimacy. Provides group verbal and written material to discuss and apply goal setting through the application of the S.M.A.R.T. Method.   Other Matters of the Heart: - Provides group verbal, written materials and models to describe Stable Angina and Peripheral Artery. Includes description  of the disease process and treatment options available to the cardiac patient.   Cardiac Rehab from 01/31/2018 in [facility] Cardiac and Pulmonary Rehab  Date  11/29/17  Educator  CE  Instruction Review Code  1- Verbalizes Understanding      Exercise & Equipment Safety: - Individual verbal instruction and demonstration of equipment use and safety with use of the equipment.   Cardiac Rehab from 01/31/2018 in [facility] Cardiac and Pulmonary Rehab  Date  11/19/17  Educator  [facility]  Instruction Review Code  1- Verbalizes Understanding      Infection Prevention: - Provides verbal and written material to individual with discussion of infection control including proper hand washing and proper equipment cleaning during exercise session.   Cardiac Rehab from 01/31/2018 in [facility] Cardiac and Pulmonary Rehab  Date  11/19/17  Educator  [facility]  Instruction Review Code  1- Verbalizes Understanding      Falls Prevention: - Provides verbal and written material to individual with discussion of falls prevention and safety.   Cardiac Rehab from 01/31/2018 in [facility] Cardiac and Pulmonary Rehab  Date  11/19/17  Educator  [facility]  Instruction Review Code  1- Verbalizes Understanding      Diabetes: - Individual verbal and written instruction to review signs/symptoms of diabetes, desired ranges of glucose level fasting, after meals and with exercise. Acknowledge that pre and post exercise glucose checks will be done for 3 sessions at entry of program.   Cardiac Rehab from  01/31/2018 in [facility] Cardiac and Pulmonary Rehab  Date  11/19/17  Educator  [facility]  Instruction Review Code  1- Verbalizes Understanding      Know Your Numbers and Risk Factors: -Group verbal and written instruction about important numbers in your health.  Discussion of what are risk factors and how they play a role in the disease process.  Review of Cholesterol, Blood Pressure, Diabetes, and BMI and the role they play in your overall health.   Cardiac Rehab from 01/31/2018 in [facility] Cardiac and Pulmonary Rehab  Date  01/15/18  Educator  SB  Instruction Review Code  1- Verbalizes Understanding      Sleep Hygiene: -Provides group verbal and written instruction about how sleep can affect your health.  Define sleep hygiene, discuss sleep cycles and impact of sleep habits. Review good sleep hygiene tips.    Other: -Provides group and verbal instruction on various topics (see comments)   Knowledge Questionnaire Score: Knowledge Questionnaire Score - 11/19/17 1057      Knowledge Questionnaire Score   Pre Score  19/26   Test reviewed with pt today.  Education Focus: A&P, Angina, Nutrition, Exercise, and Tobacco      Core Components/Risk Factors/Patient Goals at Admission: Personal Goals and Risk Factors at Admission - 11/19/17 1253      Core Components/Risk Factors/Patient Goals on Admission    Weight Management  Yes;Weight Maintenance    Intervention  Weight Management: Develop a combined nutrition and exercise program designed to reach desired caloric intake, while maintaining appropriate intake of nutrient and fiber, sodium and fats, and appropriate energy expenditure required for the weight goal.;Weight Management: Provide education and appropriate resources to help participant work on and attain dietary goals.    Admit Weight  153 lb (69.4 kg)   wants to stay between 150-155 lb   Expected Outcomes  Short Term: Continue to assess and modify interventions until short term weight is  achieved;Long Term: Adherence to nutrition and physical activity/exercise program aimed toward attainment of established  weight goal;Weight Maintenance: Understanding of the daily nutrition guidelines, which includes 25-35% calories from fat, 7% or less cal from saturated fats, less than 245m cholesterol, less than 1.5gm of sodium, & 5 or more servings of fruits and vegetables daily;Understanding recommendations for meals to include 15-35% energy as protein, 25-35% energy from fat, 35-60% energy from carbohydrates, less than 2067mof dietary cholesterol, 20-35 gm of total fiber daily;Understanding of distribution of calorie intake throughout the day with the consumption of 4-5 meals/snacks    Diabetes  Yes    Intervention  Provide education about signs/symptoms and action to take for hypo/hyperglycemia.;Provide education about proper nutrition, including hydration, and aerobic/resistive exercise prescription along with prescribed medications to achieve blood glucose in normal ranges: Fasting glucose 65-99 mg/dL    Expected Outcomes  Short Term: Participant verbalizes understanding of the signs/symptoms and immediate care of hyper/hypoglycemia, proper foot care and importance of medication, aerobic/resistive exercise and nutrition plan for blood glucose control.;Long Term: Attainment of HbA1C < 7%.    Hypertension  Yes    Intervention  Provide education on lifestyle modifcations including regular physical activity/exercise, weight management, moderate sodium restriction and increased consumption of fresh fruit, vegetables, and low fat dairy, alcohol moderation, and smoking cessation.;Monitor prescription use compliance.    Expected Outcomes  Short Term: Continued assessment and intervention until BP is < 140/9024mG in hypertensive participants. < 130/39m60m in hypertensive participants with diabetes, heart failure or chronic kidney disease.;Long Term: Maintenance of blood pressure at goal levels.    Lipids   Yes    Intervention  Provide education and support for participant on nutrition & aerobic/resistive exercise along with prescribed medications to achieve LDL <70mg60mL >40mg.67mExpected Outcomes  Short Term: Participant states understanding of desired cholesterol values and is compliant with medications prescribed. Participant is following exercise prescription and nutrition guidelines.;Long Term: Cholesterol controlled with medications as prescribed, with individualized exercise RX and with personalized nutrition plan. Value goals: LDL < 70mg, 28m> 40 mg.       Core Components/Risk Factors/Patient Goals Review:  Goals and Risk Factor Review    Row Name 12/27/17 1022 01/17/18 1022           Core Components/Risk Factors/Patient Goals Review   Personal Goals Review  Weight Management/Obesity;Hypertension;Diabetes;Lipids  Weight Management/Obesity;Hypertension;Lipids;Diabetes      Review  Ray Lamb isAarivng well with his weight and has gained some of his strength back.  He is feeling better and watching his diet.  His kidney function numbers have started to improve as well.  He is doing well with his blood sugars. His A1c is down to 6.2.  He checks them about every other day at home.  He is has been checking his blood pressure  at home daily.  It has been good here, but he is getting higher reading at home. We talked about bringing his cuff in for us to [place] to.  He is doing well with his medicaitons.   Ray Lamb weight is staying about the same and he is satisfied with that. He weighted around 170 lbs before hospitalization and has changed his diet and now holding steady weight around 150lbs. Herb brVenanciot his BP machine in to verify accuracy and found that his home machine was not accurate. He purchased a new machine and is checking BP's at home. He is taking his medications as prescribed. Blood sugars are good, last A1C was a 6.1 which is better than it has been in a long  time. Also reports that GFR is  best it's beenin 3 years and his MD said he doesn't have to worry about dialysis. This makes him very happy!      Expected Outcomes  Short: Bring blood pressure cuff in to check against what we are getting.  Long: Continue to work on maintaining weight and watching risk factors.   Short: Continue taking meds, checking BP, and Blood glucose levels at home. Long: Continue to exercise, eat right, and live healthy to control his risk factors.         Core Components/Risk Factors/Patient Goals at Discharge (Final Review):  Goals and Risk Factor Review - 01/17/18 1022      Core Components/Risk Factors/Patient Goals Review   Personal Goals Review  Weight Management/Obesity;Hypertension;Lipids;Diabetes    Review  Nana's weight is staying about the same and he is satisfied with that. He weighted around 170 lbs before hospitalization and has changed his diet and now holding steady weight around 150lbs. Capers brought his BP machine in to verify accuracy and found that his home machine was not accurate. He purchased a new machine and is checking BP's at home. He is taking his medications as prescribed. Blood sugars are good, last A1C was a 6.1 which is better than it has been in a long time. Also reports that GFR is best it's beenin 3 years and his MD said he doesn't have to worry about dialysis. This makes him very happy!    Expected Outcomes  Short: Continue taking meds, checking BP, and Blood glucose levels at home. Long: Continue to exercise, eat right, and live healthy to control his risk factors.       ITP Comments: ITP Comments    Row Name 11/19/17 1249 11/21/17 0645 12/19/17 0606 01/16/18 5396 02/12/18 0639   ITP Comments  Med Review completed. Initial ITP created. Diagnosis can be found in [facility] Encounter 8/17  30 day review.  Continue with ITP unless directed changes per Medical Director review.  30 day review. Continue with ITP unless direccted changes per Medical Director Chart Review.  30 day review.  Continue with ITP unless direccted changes per Medical Director Chart Review.  30 Day Review. Continue with ITP unless directed changes per Medical Director review      Comments:

## 2018-02-12 NOTE — Progress Notes (Signed)
Daily Session Note  Patient Details  Name: Ray Lamb MRN: [id number] Date of Birth: Jul 06, 1930 Referring Provider:     Cardiac Rehab from 11/19/2017 in [facility] Cardiac and Pulmonary Rehab  Referring Provider  Neoma Laming MD      Encounter Date: 02/12/2018  Check In: Session Check In - 02/12/18 1120      Check-In   Supervising physician immediately available to respond to emergencies  See telemetry face sheet for immediately available ER MD    Location  [facility]-Cardiac & Pulmonary Rehab    Staff Present  Gerlene Burdock, RN, BSN;Jeanna Durrell BS, Exercise Physiologist;Amanda Oletta Darter, BA, ACSM CEP, Exercise Physiologist    Medication changes reported      No    Fall or balance concerns reported     No    Tobacco Cessation  No Change    Warm-up and Cool-down  Performed as group-led instruction    Resistance Training Performed  Yes    VAD Patient?  No    PAD/SET Patient?  No      Pain Assessment   Currently in Pain?  No/denies    Multiple Pain Sites  No          Social History   Tobacco Use  Smoking Status Former Smoker  . Last attempt to quit: 09/02/1964  . Years since quitting: 53.4  Smokeless Tobacco Never Used    Goals Met:  Independence with exercise equipment Changing diet to healthy choices, watching portion sizes No report of cardiac concerns or symptoms Strength training completed today  Goals Unmet:  Not Applicable  Comments: Pt able to follow exercise prescription today without complaint.  Will continue to monitor for progression.    Dr. Emily Filbert is Medical Director for [place] and [facility].

## 2018-02-14 ENCOUNTER — Ambulatory Visit (INDEPENDENT_AMBULATORY_CARE_PROVIDER_SITE_OTHER): Payer: Medicare Other

## 2018-02-14 ENCOUNTER — Encounter: Payer: Medicare Other | Attending: Cardiology

## 2018-02-14 VITALS — BP 128/60 | HR 60 | Temp 97.8°F | Ht 65.0 in | Wt 154.0 lb

## 2018-02-14 DIAGNOSIS — Z7902 Long term (current) use of antithrombotics/antiplatelets: Secondary | ICD-10-CM | POA: Diagnosis not present

## 2018-02-14 DIAGNOSIS — I214 Non-ST elevation (NSTEMI) myocardial infarction: Secondary | ICD-10-CM | POA: Diagnosis not present

## 2018-02-14 DIAGNOSIS — E1122 Type 2 diabetes mellitus with diabetic chronic kidney disease: Secondary | ICD-10-CM | POA: Insufficient documentation

## 2018-02-14 DIAGNOSIS — I252 Old myocardial infarction: Secondary | ICD-10-CM | POA: Insufficient documentation

## 2018-02-14 DIAGNOSIS — Z87891 Personal history of nicotine dependence: Secondary | ICD-10-CM | POA: Insufficient documentation

## 2018-02-14 DIAGNOSIS — Z7984 Long term (current) use of oral hypoglycemic drugs: Secondary | ICD-10-CM | POA: Insufficient documentation

## 2018-02-14 DIAGNOSIS — Z955 Presence of coronary angioplasty implant and graft: Secondary | ICD-10-CM | POA: Insufficient documentation

## 2018-02-14 DIAGNOSIS — Z8673 Personal history of transient ischemic attack (TIA), and cerebral infarction without residual deficits: Secondary | ICD-10-CM | POA: Insufficient documentation

## 2018-02-14 DIAGNOSIS — E785 Hyperlipidemia, unspecified: Secondary | ICD-10-CM | POA: Diagnosis not present

## 2018-02-14 DIAGNOSIS — K219 Gastro-esophageal reflux disease without esophagitis: Secondary | ICD-10-CM | POA: Diagnosis not present

## 2018-02-14 DIAGNOSIS — N184 Chronic kidney disease, stage 4 (severe): Secondary | ICD-10-CM | POA: Diagnosis not present

## 2018-02-14 DIAGNOSIS — I129 Hypertensive chronic kidney disease with stage 1 through stage 4 chronic kidney disease, or unspecified chronic kidney disease: Secondary | ICD-10-CM | POA: Diagnosis not present

## 2018-02-14 DIAGNOSIS — Z0000 Encounter for general adult medical examination without abnormal findings: Secondary | ICD-10-CM

## 2018-02-14 DIAGNOSIS — Z79899 Other long term (current) drug therapy: Secondary | ICD-10-CM | POA: Insufficient documentation

## 2018-02-14 DIAGNOSIS — I2510 Atherosclerotic heart disease of native coronary artery without angina pectoris: Secondary | ICD-10-CM | POA: Insufficient documentation

## 2018-02-14 NOTE — Progress Notes (Signed)
Subjective:   Ray Lamb is a 85 y.o. male who presents for Medicare Annual/Subsequent preventive examination.    Objective:    Vitals: BP 128/60 (BP Location: Left Arm, Patient Position: Sitting)   Pulse 60   Temp 97.8 F (36.6 C) (Temporal)   Ht 5\' 5"  (1.651 m)   Wt 154 lb (69.9 kg)   SpO2 98%   BMI 25.63 kg/m   Body mass index is 25.63 kg/m.  Advanced Directives 02/14/2018 11/19/2017 10/31/2017 10/23/2017 10/04/2017 09/30/2017 01/28/2017  Does Patient Have a Medical Advance Directive? No No No No - No No  Would patient like information on creating a medical advance directive? Yes (MAU/Ambulatory/Procedural Areas - Information given) No - Patient declined No - Patient declined No - Patient declined No - Patient declined No - Patient declined -    Tobacco Social History   Tobacco Use  Smoking Status Former Smoker  . Last attempt to quit: 09/02/1964  . Years since quitting: 53.4  Smokeless Tobacco Never Used     Counseling given: Not Answered   Clinical Intake:  Pre-visit preparation completed: No  Pain : No/denies pain     Nutritional Risks: None Diabetes: No  How often do you need to have someone help you when you read instructions, pamphlets, or other written materials from your doctor or pharmacy?: 1 - Never  Interpreter Needed?: No  Comments: college Information entered by :: Tyson Dense, RN  Past Medical History:  Diagnosis Date  . Anemia   . Chronic kidney disease    STAGE 4  . Coronary atherosclerosis    CABG X 4  . Diabetes mellitus without complication ([place])   . Edema    ankle  . GERD (gastroesophageal reflux disease)   . Glaucoma   . Hearing loss   . Heart murmur   . History of hiatal hernia   . Hyperlipidemia   . Hypertension   . Mini stroke ([place])   . Mitral insufficiency   . Myocardial infarction ([place])    mild, heart cath done no stents needed  . Stroke [facility]) 2011   TIA   Past Surgical History:  Procedure Laterality Date  .  CATARACT EXTRACTION W/PHACO Left 09/05/2016   Procedure: CATARACT EXTRACTION PHACO AND INTRAOCULAR LENS PLACEMENT (IOC);  Surgeon: Birder Robson, MD;  Location: [facility] ORS;  Service: Ophthalmology;  Laterality: Left;  [place] 00:43.1 AP% 22.6 CDE 9.77 Fluid Pack lot #[id number]  . CATARACT EXTRACTION W/PHACO Right 09/26/2016   Procedure: CATARACT EXTRACTION PHACO AND INTRAOCULAR LENS PLACEMENT ([place]);  Surgeon: Birder Robson, MD;  Location: [facility] ORS;  Service: Ophthalmology;  Laterality: Right;  [place] 01:00 AP% 16.0 CDE 9.71 Fluid pack lot # [id number]  . CORONARY ARTERY BYPASS GRAFT  2003  . DIALYSIS/PERMA CATHETER INSERTION N/A 10/04/2017   Procedure: DIALYSIS/PERMA CATHETER INSERTION;  Surgeon: Algernon Huxley, MD;  Location: [place] CV LAB;  Service: Cardiovascular;  Laterality: N/A;  . DIALYSIS/PERMA CATHETER REMOVAL N/A 11/01/2017   Procedure: DIALYSIS/PERMA CATHETER REMOVAL;  Surgeon: Algernon Huxley, MD;  Location: [place] CV LAB;  Service: Cardiovascular;  Laterality: N/A;  . ESOPHAGOGASTRODUODENOSCOPY (EGD) WITH PROPOFOL N/A 04/25/2016   Procedure: ESOPHAGOGASTRODUODENOSCOPY (EGD) WITH PROPOFOL;  Surgeon: Lucilla Lame, MD;  Location: [facility] ENDOSCOPY;  Service: Endoscopy;  Laterality: N/A;  . JOINT REPLACEMENT Right 2010   knee  . LEFT HEART CATH AND CORONARY ANGIOGRAPHY N/A 10/04/2017   Procedure: LEFT HEART CATH AND CORONARY ANGIOGRAPHY;  Surgeon: Isaias Cowman, MD;  Location: [place]  CV LAB;  Service: Cardiovascular;  Laterality: N/A;  . SKIN CANCER EXCISION     Family History  Problem Relation Age of Onset  . Stroke Mother   . Diabetes Mother   . Diabetes Sister   . Diabetes Maternal Grandmother    Social History   Socioeconomic History  . Marital status: Married    Spouse name: Not on file  . Number of children: Not on file  . Years of education: Not on file  . Highest education level: Not on file  Occupational History  . Not on file  Social Needs  .  Financial resource strain: Not hard at all  . Food insecurity:    Worry: Never true    Inability: Never true  . Transportation needs:    Medical: No    Non-medical: No  Tobacco Use  . Smoking status: Former Smoker    Last attempt to quit: 09/02/1964    Years since quitting: 53.4  . Smokeless tobacco: Never Used  Substance and Sexual Activity  . Alcohol use: No    Alcohol/week: 0.0 standard drinks  . Drug use: No  . Sexual activity: Not on file  Lifestyle  . Physical activity:    Days per week: 2 days    Minutes per session: 60 min  . Stress: Not at all  Relationships  . Social connections:    Talks on phone: More than three times a week    Gets together: More than three times a week    Attends religious service: More than 4 times per year    Active member of club or organization: No    Attends meetings of clubs or organizations: Never    Relationship status: Married  Other Topics Concern  . Not on file  Social History Narrative  . Not on file    Outpatient Encounter Medications as of 02/14/2018  Medication Sig  . atorvastatin (LIPITOR) 80 MG tablet Take 1 tablet (80 mg total) by mouth at bedtime.  . clopidogrel (PLAVIX) 75 MG tablet Take 1 tablet (75 mg total) by mouth daily with breakfast.  . donepezil (ARICEPT) 5 MG tablet Take 5 mg by mouth at bedtime.  Marland Kitchen ezetimibe (ZETIA) 10 MG tablet Take 1 tablet (10 mg total) by mouth daily.  . furosemide (LASIX) 40 MG tablet Take 40 mg by mouth daily as needed for fluid.   Marland Kitchen glipiZIDE (GLUCOTROL) 5 MG tablet Take 5 mg by mouth daily before breakfast.  . hydrALAZINE (APRESOLINE) 25 MG tablet   . isosorbide mononitrate (IMDUR) 30 MG 24 hr tablet Take 30 mg by mouth daily.   . metoprolol tartrate (LOPRESSOR) 50 MG tablet Take 1 tablet (50 mg total) by mouth every 12 (twelve) hours.  . Multiple Vitamin (MULTIVITAMIN) tablet Take 1 tablet by mouth daily.  . sitaGLIPtin (JANUVIA) 25 MG tablet Take 25 mg by mouth daily.  . timolol  (TIMOPTIC) 0.5 % ophthalmic solution Place 1 drop into both eyes daily.   . TURMERIC PO Take 1,300 mg by mouth daily.   . [DISCONTINUED] Melatonin 3 MG CAPS Take 3 mg by mouth at bedtime.   . [DISCONTINUED] NON FORMULARY Diet order: Downgrade patient to dysphagia 2 diet, continue with thin liquids; NO straws, add extra gravy.  . [DISCONTINUED] Nutritional Supplements (FEEDING SUPPLEMENT, NEPRO CARB STEADY,) LIQD Take 237 mLs by mouth 3 (three) times daily between meals.   No facility-administered encounter medications on file as of 02/14/2018.     Activities of Daily Living  In your present state of health, do you have any difficulty performing the following activities: 02/14/2018 09/30/2017  Hearing? N Y  Vision? N N  Difficulty concentrating or making decisions? Y N  Walking or climbing stairs? Y N  Dressing or bathing? N N  Doing errands, shopping? N N  Preparing Food and eating ? N -  Using the Toilet? N -  In the past six months, have you accidently leaked urine? Y -  Do you have problems with loss of bowel control? N -  Managing your Medications? N -  Managing your Finances? N -  Housekeeping or managing your Housekeeping? N -  Some recent data might be hidden    Patient Care Team: [name], MD as PCP - General (Family Medicine) [name], MD as PCP - Family Medicine (Family Medicine) Murlean Iba, MD (Internal Medicine) Dionisio David, MD as Consulting Physician (Cardiology) Sharlotte Alamo, DPM (Podiatry) Ronita Hipps Elmer Ramp., MD (Dentistry) Lucilla Lame, MD as Consulting Physician (Gastroenterology) Vladimir Crofts, MD as Consulting Physician (Neurology)   Assessment:   This is a routine wellness examination for [place].  Exercise Activities and Dietary recommendations Current Exercise Habits: Home exercise routine;Structured exercise class, Type of exercise: walking;strength training/weights, Time (Minutes): 60, Frequency (Times/Week): 2, Weekly Exercise  (Minutes/Week): 120, Intensity: Mild, Exercise limited by: orthopedic condition(s)  Goals    . Increase water intake     Recommend drinking at least 5-6 glasses of water a day        Fall Risk Fall Risk  02/14/2018 11/19/2017 06/05/2017 06/05/2017 02/26/2017  Falls in the past year? 0 Yes No No Yes  Number falls in past yr: - 1 - - 1  Injury with Fall? - Yes - - Yes  Comment - nose bleed - - -  Risk Factor Category  - High Fall Risk - - High Fall Risk  Risk for fall due to : - History of fall(s);Impaired mobility;Impaired balance/gait - - -  Follow up - Falls evaluation completed;Education provided;Falls prevention discussed - - Falls evaluation completed   Is the patient's home free of loose throw rugs in walkways, pet beds, electrical cords, etc?   yes      Grab bars in the bathroom? yes      Handrails on the stairs?   yes      Adequate lighting?   yes  Depression Screen PHQ 2/9 Scores 02/14/2018 12/27/2017 11/19/2017 06/05/2017  PHQ - 2 Score 0 2 2 0  PHQ- 9 Score - 3 10 -    Cognitive Function MMSE - Mini Mental State Exam 02/01/2016  Orientation to time 5  Orientation to Place 5  Registration 2  Attention/ Calculation 3  Recall 3  Language- name 2 objects 2  Language- repeat 1  Language- follow 3 step command 3  Language- read & follow direction 1  Write a sentence 1  Copy design 1  Total score 27     6CIT Screen 02/14/2018 12/11/2016  What Year? 0 points 0 points  What month? 0 points 0 points  What time? 0 points 0 points  Count back from 20 0 points 0 points  Months in reverse 0 points 0 points  Repeat phrase 4 points 2 points  Total Score 4 2    Immunization History  Administered Date(s) Administered  . Influenza, High Dose Seasonal PF 11/26/2015, 12/11/2016, 12/05/2017  . Influenza,inj,Quad PF,6+ Mos 12/03/2014  . Influenza-Unspecified 11/12/2013  . PPD Test 10/05/2017  .  Pneumococcal Conjugate-13 09/03/2013  . Pneumococcal Polysaccharide-23 10/15/1995,  11/13/2000  . Td 08/08/2004, 10/06/2015  . Zoster 02/26/2006    Qualifies for Shingles Vaccine? Yes, educated and ordered to pharmacy  Screening Tests Health Maintenance  Topic Date Due  . FOOT EXAM  02/26/2018  . OPHTHALMOLOGY EXAM  04/25/2018  . HEMOGLOBIN A1C  06/06/2018  . TETANUS/TDAP  10/05/2025  . INFLUENZA VACCINE  Completed  . PNA vac Low Risk Adult  Completed   Cancer Screenings: Lung: Low Dose CT Chest recommended if Age 99-80 years, 30 pack-year currently smoking OR have quit w/in 15years. Patient does not qualify. Colorectal: up to date  Additional Screenings:  Hepatitis C Screening: declined      Plan:    I have personally reviewed and addressed the Medicare Annual Wellness questionnaire and have noted the following in the patient's chart:  A. Medical and social history B. Use of alcohol, tobacco or illicit drugs  C. Current medications and supplements D. Functional ability and status E.  Nutritional status F.  Physical activity G. Advance directives H. List of other physicians I.  Hospitalizations, surgeries, and ER visits in previous 12 months J.  [place] to include hearing, vision, cognitive, depression L. Referrals and appointments - none  In addition, I have reviewed and discussed with patient certain preventive protocols, quality metrics, and best practice recommendations. A written personalized care plan for preventive services as well as general preventive health recommendations were provided to patient.  See attached scanned questionnaire for additional information.   Signed,   Tyson Dense, RN Nurse Health Advisor  Patient concerns: Having some problems staying asleep ever since he has been in the hospital. Melatonin wasn't helping because he doesn't have any issues falling asleep

## 2018-02-14 NOTE — Progress Notes (Signed)
Cardiac Individual Treatment Plan  Patient Details  Name: Ray Lamb MRN: [id number] Date of Birth: 09-28-1930 Referring Provider:     Cardiac Rehab from 11/19/2017 in [facility] Cardiac and Pulmonary Rehab  Referring Provider  Neoma Laming MD      Initial Encounter Date:    Cardiac Rehab from 11/19/2017 in [facility] Cardiac and Pulmonary Rehab  Date  11/19/17      Visit Diagnosis: NSTEMI (non-ST elevated myocardial infarction) [facility])  Patient's Home Medications on Admission:  Current Outpatient Medications:  .  atorvastatin (LIPITOR) 80 MG tablet, Take 1 tablet (80 mg total) by mouth at bedtime., Disp: 90 tablet, Rfl: 4 .  clopidogrel (PLAVIX) 75 MG tablet, Take 1 tablet (75 mg total) by mouth daily with breakfast., Disp: , Rfl:  .  donepezil (ARICEPT) 5 MG tablet, Take 5 mg by mouth at bedtime., Disp: , Rfl:  .  ezetimibe (ZETIA) 10 MG tablet, Take 1 tablet (10 mg total) by mouth daily., Disp: 90 tablet, Rfl: 4 .  furosemide (LASIX) 40 MG tablet, Take 40 mg by mouth daily as needed for fluid. , Disp: , Rfl:  .  glipiZIDE (GLUCOTROL) 5 MG tablet, Take 5 mg by mouth daily before breakfast., Disp: , Rfl:  .  hydrALAZINE (APRESOLINE) 25 MG tablet, , Disp: , Rfl:  .  isosorbide mononitrate (IMDUR) 30 MG 24 hr tablet, Take 30 mg by mouth daily. , Disp: , Rfl:  .  Melatonin 3 MG CAPS, Take 3 mg by mouth at bedtime. , Disp: , Rfl:  .  metoprolol tartrate (LOPRESSOR) 50 MG tablet, Take 1 tablet (50 mg total) by mouth every 12 (twelve) hours., Disp: , Rfl:  .  Multiple Vitamin (MULTIVITAMIN) tablet, Take 1 tablet by mouth daily., Disp: , Rfl:  .  NON FORMULARY, Diet order: Downgrade patient to dysphagia 2 diet, continue with thin liquids; NO straws, add extra gravy., Disp: , Rfl:  .  Nutritional Supplements (FEEDING SUPPLEMENT, NEPRO CARB STEADY,) LIQD, Take 237 mLs by mouth 3 (three) times daily between meals., Disp: , Rfl: 0 .  sitaGLIPtin (JANUVIA) 25 MG tablet, Take 25 mg by mouth daily., Disp: ,  Rfl:  .  timolol (TIMOPTIC) 0.5 % ophthalmic solution, Place 1 drop into both eyes daily. , Disp: , Rfl:  .  TURMERIC PO, Take 1,300 mg by mouth daily. , Disp: , Rfl:   Past Medical History: Past Medical History:  Diagnosis Date  . Anemia   . Chronic kidney disease    STAGE 4  . Coronary atherosclerosis    CABG X 4  . Diabetes mellitus without complication ([place])   . Edema    ankle  . GERD (gastroesophageal reflux disease)   . Glaucoma   . Hearing loss   . Heart murmur   . History of hiatal hernia   . Hyperlipidemia   . Hypertension   . Mini stroke ([place])   . Mitral insufficiency   . Myocardial infarction ([place])    mild, heart cath done no stents needed  . Stroke [facility]) 2011   TIA    Tobacco Use: Social History   Tobacco Use  Smoking Status Former Smoker  . Last attempt to quit: 09/02/1964  . Years since quitting: 53.4  Smokeless Tobacco Never Used    Labs: Recent Chemical engineer    Labs for ITP Cardiac and Pulmonary Rehab Latest Ref Rng & Units 09/30/2017 10/09/2017 10/10/2017 11/06/2017 12/05/2017   Cholestrol <200 mg/dL - - - - -  LDLCALC 0 - 99 mg/dL - - - - -   HDL >39 mg/dL - - - - -   Trlycerides <150 mg/dL - - - - -   Hemoglobin A1c <7.0 % - - - 6.8(H) 6.5   PHART 7.350 - 7.450 7.20(L) 7.47(H) 7.48(H) - -   PCO2ART 32.0 - 48.0 mmHg 37 45 45 - -   HCO3 20.0 - 28.0 mmol/L 14.5(L) 32.8(H) 33.5(H) - -   ACIDBASEDEF 0.0 - 2.0 mmol/L 12.6(H) - - - -   O2SAT % 91.0 91.1 97.9 - -       Exercise Target Goals: Exercise Program Goal: Individual exercise prescription set using results from initial 6 min walk test and THRR while considering  patient's activity barriers and safety.   Exercise Prescription Goal: Initial exercise prescription builds to 30-45 minutes a day of aerobic activity, 2-3 days per week.  Home exercise guidelines will be given to patient during program as part of exercise prescription that the participant will acknowledge.  Activity  Barriers & Risk Stratification: Activity Barriers & Cardiac Risk Stratification - 11/19/17 1220      Activity Barriers & Cardiac Risk Stratification   Activity Barriers  Decreased Ventricular Function;Deconditioning;Muscular Weakness;Balance Concerns;History of Falls;Assistive Device    Cardiac Risk Stratification  High       6 Minute Walk: 6 Minute Walk    Row Name 11/19/17 1219 02/07/18 1225       6 Minute Walk   Phase  Initial  Discharge    Distance  585 feet  870 feet    Distance % Change  -  48.7 %    Distance Feet Change  -  285 ft    Walk Time  6 minutes  6 minutes    # of Rest Breaks  0  0    MPH  1.11  1.21    METS  0.95  1.65    RPE  12  13    VO2 Peak  3.31  4.23    Symptoms  No  No    Resting HR  63 bpm  59 bpm    Resting BP  122/64  126/60    Resting Oxygen Saturation   100 %  -    Exercise Oxygen Saturation  during 6 min walk  100 %  -    Max Ex. HR  100 bpm  80 bpm    Max Ex. BP  134/76  132/64    2 Minute Post BP  124/64  -       Oxygen Initial Assessment:   Oxygen Re-Evaluation:   Oxygen Discharge (Final Oxygen Re-Evaluation):   Initial Exercise Prescription: Initial Exercise Prescription - 11/19/17 1200      Date of Initial Exercise RX and Referring Provider   Date  11/19/17    Referring Provider  Neoma Laming MD      Treadmill   MPH  0.8    Grade  0.5    Minutes  15    METs  1.7      Recumbant Bike   Level  1    RPM  50    Watts  10    Minutes  15    METs  1.7      NuStep   Level  1    SPM  80    Minutes  15    METs  1.7      Prescription Details   Frequency (times per week)  2    Duration  Progress to 30 minutes of continuous aerobic without signs/symptoms of physical distress      Intensity   THRR 40-80% of Max Heartrate  92-121    Ratings of Perceived Exertion  11-13    Perceived Dyspnea  0-4      Progression   Progression  Continue to progress workloads to maintain intensity without signs/symptoms of physical  distress.      Resistance Training   Training Prescription  Yes    Weight  3 lbs    Reps  10-15       Perform Capillary Blood Glucose checks as needed.  Exercise Prescription Changes: Exercise Prescription Changes    Row Name 11/19/17 1200 12/13/17 1000 12/25/17 1600 01/07/18 1600 01/23/18 1400     Response to Exercise   Blood Pressure (Admit)  122/64  -  124/74  124/70  138/60   Blood Pressure (Exercise)  134/76  -  144/68  144/58  138/82   Blood Pressure (Exit)  124/64  -  136/60  104/54  128/56   Heart Rate (Admit)  63 bpm  -  91 bpm  68 bpm  56 bpm   Heart Rate (Exercise)  100 bpm  -  92 bpm  91 bpm  76 bpm   Heart Rate (Exit)  63 bpm  -  62 bpm  65 bpm  58 bpm   Oxygen Saturation (Admit)  100 %  -  -  -  -   Oxygen Saturation (Exercise)  100 %  -  -  -  -   Rating of Perceived Exertion (Exercise)  12  -  13  13  12    Symptoms  none  -  none  none  none   Comments  walk test results  -  -  -  -   Duration  -  -  Continue with 30 min of aerobic exercise without signs/symptoms of physical distress.  Continue with 30 min of aerobic exercise without signs/symptoms of physical distress.  Continue with 30 min of aerobic exercise without signs/symptoms of physical distress.   Intensity  -  -  THRR unchanged  THRR unchanged  THRR unchanged     Progression   Progression  -  -  Continue to progress workloads to maintain intensity without signs/symptoms of physical distress.  Continue to progress workloads to maintain intensity without signs/symptoms of physical distress.  Continue to progress workloads to maintain intensity without signs/symptoms of physical distress.   Average METs  -  -  2.55  2.43  2.67     Resistance Training   Training Prescription  -  -  Yes  Yes  Yes   Weight  -  -  3 lbs  3 lbs  3 lbs   Reps  -  -  10-15  10-15  10-15     Interval Training   Interval Training  -  -  No  No  No     Treadmill   MPH  -  -  0.9  1.2  1.2   Grade  -  -  0.5  0.5  0.5    Minutes  -  -  15  15  15    METs  -  -  1.75  2  2     Recumbant Bike   Level  -  -  3  3  5    Watts  -  -  22  20  20    Minutes  -  -  15  15  15    METs  -  -  2.9  2.9  2.9     NuStep   Level  -  -  5  5  5    Minutes  -  -  15  15  15    METs  -  -  3  2.4  1.9     Home Exercise Plan   Plans to continue exercise at  [facility] (comment) treadmill, recumbent bike, and Nustep at the [employer] (comment) treadmill, recumbent bike, and Nustep at the [employer] (comment) treadmill, recumbent bike, and Nustep at the [employer] (comment) treadmill, recumbent bike, and Nustep at the [employer]  -  Add 2 additional days to program exercise sessions.  Add 2 additional days to program exercise sessions.  Add 2 additional days to program exercise sessions.  Add 2 additional days to program exercise sessions.   Initial Home Exercises Provided  -  12/13/17  12/13/17  12/13/17  12/13/17   Row Name 02/05/18 1000             Response to Exercise   Blood Pressure (Admit)  126/58       Blood Pressure (Exercise)  136/68       Blood Pressure (Exit)  128/60       Heart Rate (Admit)  49 bpm       Heart Rate (Exercise)  75 bpm       Heart Rate (Exit)  71 bpm       Rating of Perceived Exertion (Exercise)  12       Symptoms  none       Duration  Continue with 30 min of aerobic exercise without signs/symptoms of physical distress.       Intensity  THRR unchanged         Progression   Progression  Continue to progress workloads to maintain intensity without signs/symptoms of physical distress.       Average METs  2.47         Resistance Training   Training Prescription  Yes       Weight  3 lbs       Reps  10-15         Interval Training   Interval Training  No         Treadmill   MPH  1.2       Grade  0.5       Minutes  15       METs  2         Recumbant Bike   Level  5       Watts  18       Minutes  15       METs  2.9          NuStep   Level  5       Minutes  15       METs  2.5         Home Exercise Plan   Plans to continue exercise at  [employer] (comment) treadmill, recumbent bike, and Nustep at the [employer]  Add 2 additional days to program exercise sessions.       Initial Home Exercises Provided  12/13/17  Exercise Comments: Exercise Comments    Row Name 11/29/17 0926 02/14/18 1012         Exercise Comments  First full day of exercise!  Patient was oriented to gym and equipment including functions, settings, policies, and procedures.  Patient's individual exercise prescription and treatment plan were reviewed.  All starting workloads were established based on the results of the 6 minute walk test done at initial orientation visit.  The plan for exercise progression was also introduced and progression will be customized based on patient's performance and goals.  Arnaldo graduated today from  rehab with 35 sessions completed.  Details of the patient's exercise prescription and what He needs to do in order to continue the prescription and progress were discussed with patient.  Patient was given a copy of prescription and goals.  Patient verbalized understanding.  Lucia plans to continue to exercise by joining the [employer].         Exercise Goals and Review: Exercise Goals    Row Name 11/19/17 1226             Exercise Goals   Increase Physical Activity  Yes       Intervention  Provide advice, education, support and counseling about physical activity/exercise needs.;Develop an individualized exercise prescription for aerobic and resistive training based on initial evaluation findings, risk stratification, comorbidities and participant's personal goals.       Expected Outcomes  Short Term: Attend rehab on a regular basis to increase amount of physical activity.;Long Term: Add in home exercise to make exercise part of routine and to increase amount of physical activity.;Long Term:  Exercising regularly at least 3-5 days a week.       Increase Strength and Stamina  Yes       Intervention  Provide advice, education, support and counseling about physical activity/exercise needs.;Develop an individualized exercise prescription for aerobic and resistive training based on initial evaluation findings, risk stratification, comorbidities and participant's personal goals.       Expected Outcomes  Short Term: Increase workloads from initial exercise prescription for resistance, speed, and METs.;Short Term: Perform resistance training exercises routinely during rehab and add in resistance training at home;Long Term: Improve cardiorespiratory fitness, muscular endurance and strength as measured by increased METs and functional capacity (6MWT)       Able to understand and use rate of perceived exertion (RPE) scale  Yes       Intervention  Provide education and explanation on how to use RPE scale       Expected Outcomes  Short Term: Able to use RPE daily in rehab to express subjective intensity level;Long Term:  Able to use RPE to guide intensity level when exercising independently       Knowledge and understanding of Target Heart Rate Range (THRR)  Yes       Intervention  Provide education and explanation of THRR including how the numbers were predicted and where they are located for reference       Expected Outcomes  Short Term: Able to state/look up THRR;Short Term: Able to use daily as guideline for intensity in rehab;Long Term: Able to use THRR to govern intensity when exercising independently       Able to check pulse independently  Yes       Intervention  Provide education and demonstration on how to check pulse in carotid and radial arteries.;Review the importance of being able to check your own pulse for safety during independent exercise  Expected Outcomes  Short Term: Able to explain why pulse checking is important during independent exercise;Long Term: Able to check pulse  independently and accurately       Understanding of Exercise Prescription  Yes       Intervention  Provide education, explanation, and written materials on patient's individual exercise prescription       Expected Outcomes  Short Term: Able to explain program exercise prescription;Long Term: Able to explain home exercise prescription to exercise independently          Exercise Goals Re-Evaluation : Exercise Goals Re-Evaluation    Row Name 11/29/17 0926 12/11/17 1546 12/13/17 1010 12/25/17 1631 12/27/17 1018     Exercise Goal Re-Evaluation   Exercise Goals Review  Increase Physical Activity;Increase Strength and Stamina;Able to understand and use rate of perceived exertion (RPE) scale;Knowledge and understanding of Target Heart Rate Range (THRR);Understanding of Exercise Prescription  Increase Physical Activity;Increase Strength and Stamina;Understanding of Exercise Prescription  Increase Physical Activity;Able to understand and use rate of perceived exertion (RPE) scale;Knowledge and understanding of Target Heart Rate Range (THRR);Increase Strength and Stamina;Able to check pulse independently;Understanding of Exercise Prescription  Increase Physical Activity;Increase Strength and Stamina;Understanding of Exercise Prescription  Increase Physical Activity;Increase Strength and Stamina;Understanding of Exercise Prescription   Comments  Reviewed RPE scale, THR and program prescription with pt today.  Pt voiced understanding and was given a copy of goals to take home.  Jaylin is off to a good start in rehab.  He is already up to level 2 on the recumbent bike.  We will continue monitor his progression.   Reviewed home exercise with pt today.  Pt plans to exercise at [facility] using the treadmill, recumbent bike, NuStep and hand weights for exercise.  Reviewed THR, pulse, RPE, sign and symptoms, NTG use, and when to call 911 or MD.  Also discussed weather considerations and indoor options.  Pt voiced  understanding.  [place] has been doing well in rehab.  He is up to 22 watts at level 3 on the recumbent bike.  We will continue to monitor progression.   Les is doing well in rehab.  He is feeling pretty good with his exercise.  He is starting to feel stronger and has more stamina.  He has even started to leave his cane in one spot while in class.  He continues to go gym on his off days  for 5 days a week and does 13mn at the gym usually treadmill and NuStep.    Expected Outcomes  Short: Use RPE daily to regulate intensity. Long: Follow program prescription in THR.  Short: Review home exercise guidelines  Long: Continue increase strength and stamina  Short: HOnisis going to add 1-2 days and eventually 3 days of exercise [facility] [employer] [facility] [employer], recumbent bike, Nustep, and hand weights. Long: Continue exercising outside of class and walking 30 minutes on the treadmill without stopping.   Short: Continue to push him to do more.  Long: Continue to exercise on his own on off days.   Short: Increase treadmill at gym like he did here.  Long: Continue to exercise on his own.    RSaucierName 01/07/18 1658 01/17/18 1032 02/05/18 1050         Exercise Goal Re-Evaluation   Exercise Goals Review  Increase Physical Activity;Increase Strength and Stamina;Understanding of Exercise Prescription  Increase Physical Activity;Increase Strength and Stamina;Able to understand and use rate of perceived exertion (RPE) scale;Knowledge and understanding of Target  Heart Rate Range (THRR);Able to check pulse independently  Increase Physical Activity;Increase Strength and Stamina;Understanding of Exercise Prescription     Comments  Bay is doing well in rehab.  He is up to 1.2 mph on the treadmill now.  We will continue to monitor his progress.   Khoen is doing well in rehab. He has increased his work load on the Nustep to Level 3. We will continue to monitor his progress. He is going to the gym 2 extra days. His cardiologist is going  to do a stress test in February.  [place] continues to do well in rehab.  He is is up to level 5 on the bike and doing the same at the gym.  We will will continue to monitor his progress.      Expected Outcomes  Short: Continue to walk more. Long: Continue to increase strength and stamina.   Short: Continue to increase home exercise. Long: Continue to increase work loads and graduate from Cardiac Rehab but continue exercising beyond that.  Short: Try to increase speed on treadmill.  Long: Continue to exercise on off days.         Discharge Exercise Prescription (Final Exercise Prescription Changes): Exercise Prescription Changes - 02/05/18 1000      Response to Exercise   Blood Pressure (Admit)  126/58    Blood Pressure (Exercise)  136/68    Blood Pressure (Exit)  128/60    Heart Rate (Admit)  49 bpm    Heart Rate (Exercise)  75 bpm    Heart Rate (Exit)  71 bpm    Rating of Perceived Exertion (Exercise)  12    Symptoms  none    Duration  Continue with 30 min of aerobic exercise without signs/symptoms of physical distress.    Intensity  THRR unchanged      Progression   Progression  Continue to progress workloads to maintain intensity without signs/symptoms of physical distress.    Average METs  2.47      Resistance Training   Training Prescription  Yes    Weight  3 lbs    Reps  10-15      Interval Training   Interval Training  No      Treadmill   MPH  1.2    Grade  0.5    Minutes  15    METs  2      Recumbant Bike   Level  5    Watts  18    Minutes  15    METs  2.9      NuStep   Level  5    Minutes  15    METs  2.5      Home Exercise Plan   Plans to continue exercise at  [employer] (comment)   treadmill, recumbent bike, and Nustep at the [employer]  Add 2 additional days to program exercise sessions.    Initial Home Exercises Provided  12/13/17       Nutrition:  Target Goals: Understanding of nutrition guidelines, daily intake of sodium <1557m,  cholesterol <2063m calories 30% from fat and 7% or less from saturated fats, daily to have 5 or more servings of fruits and vegetables.  Biometrics: Pre Biometrics - 11/19/17 1227      Pre Biometrics   Height  5' 5.3" (1.659 m)    Weight  153 lb 8 oz (69.6 kg)    Waist Circumference  35 inches    Hip  Circumference  39 inches    Waist to Hip Ratio  0.9 %    BMI (Calculated)  25.3    Single Leg Stand  1.58 seconds      Post Biometrics - 02/07/18 1226       Post  Biometrics   Height  5' 5.3" (1.659 m)    Weight  156 lb (70.8 kg)    Waist Circumference  35 inches    Hip Circumference  39 inches    Waist to Hip Ratio  0.9 %    BMI (Calculated)  25.71    Single Leg Stand  1.38 seconds       Nutrition Therapy Plan and Nutrition Goals: Nutrition Therapy & Goals - 11/29/17 1249      Nutrition Therapy   Diet  DM    Drug/Food Interactions  Statins/Certain Fruits    Protein (specify units)  10-11oz    Fiber  30 grams    Whole Grain Foods  3 servings   chooses multigrain low sodium bread   Saturated Fats  13 max. grams    Fruits and Vegetables  5 servings/day   8 ideal; eats fruits and vegetables regularly but eats only small portions   Sodium  1500 grams      Personal Nutrition Goals   Nutrition Goal  Eat breakfast an hour later on the mornings you are coming to class to exercise. This will help prevent episodes of low blood sugar intra/post exertion    Personal Goal #2  Limit foods high in sodium and potassium. Use the packet provided as a guide    Personal Goal #3  When eating out, ask for gravies and sauces on the side, order non-fried items, and/or ask for no extra salt to be added to meals    Comments  He and his wife have been trying to minimize sodium intake, but they eat out frequently. They choose low sodium breakfast meats, bread, and canned items for meals at home. Typically eats breakfast and dinner with a light lunch or mid afternoon snack. He is regaining his  appetite s/p hospital and rehab stay but still eats small portions. His wife is trying to cook with salt-free herbs and spices but needs further instruction.      Intervention Plan   Intervention  Prescribe, educate and counsel regarding individualized specific dietary modifications aiming towards targeted core components such as weight, hypertension, lipid management, diabetes, heart failure and other comorbidities.;Nutrition handout(s) given to patient.   Salt free seasoning blends handout; DM type II and CKD nutrition therapy packet   Expected Outcomes  Short Term Goal: Understand basic principles of dietary content, such as calories, fat, sodium, cholesterol and nutrients.;Short Term Goal: A plan has been developed with personal nutrition goals set during dietitian appointment.;Long Term Goal: Adherence to prescribed nutrition plan.       Nutrition Assessments: Nutrition Assessments - 11/19/17 1258      MEDFICTS Scores   Pre Score  29       Nutrition Goals Re-Evaluation: Nutrition Goals Re-Evaluation    Row Name 11/29/17 1255 12/25/17 1039 01/29/18 1030         Goals   Nutrition Goal  When eating out, ask for gravies and sauces on the side, order non-fried items, and/or ask for no extra salt to be added to meals  When eating out ask for gravies and sauces on the side / order non-fried items/ or ask for no extra salt to be added to  the food; eat breakfast an hour later on days you are coming to exercise to avoid hypoglycemia; limit food high in sodium as potassium  When eating out ask for gravies and sauces on the side, order non-fried items, ask for no extra salt to be added to the food; Eat breakfast an hour later on days you are coming to exercise to avoid low blood sugar; limit food high in sodium and potassium     Comment  He and his wife eat out frequently, as they enjoy this time together out of the house. He has been trying to watch his sodium intake however and knows that it is  often difficult to monitor sodium when eating out. They choose restaurants over fast food  He and his wife have been working to make tweaks to their diet and eating patterns. They are eating out slightly less and states that his pertinent lab values are WNL. He feels he is doing well overall and feels that his appetite and weight are stable  He and his wife are learning to make healthier choices when eating out with success. He states that his wife is even starting to make healthy choices too. He feels pleased with the changes he has made and feels that his appetite has improved slightly. He has been eating breakfast "a little" later than normal on class days, and also brings candy with him to class if he feels that his blood sugar is dropping     Expected Outcome  He will make adjustments to his food orders in order to help decrease sodium content of meals when eating out  He will continue to make tweaks to his current eating pattern and maintain CBW +/- 5#  Maintain CBW +/- 5#, continue to limit sodium and potassium prn and to make health-conscious food and beverage choices when eating out       Personal Goal #2 Re-Evaluation   Personal Goal #2  Eat breakfast an hour later on mornings you are coming to class to exercise. This will help prevent episodes of low blood sugar intra/post exertion  -  -       Personal Goal #3 Re-Evaluation   Personal Goal #3  Limit foods high in sodium and potassium. Use the packet provided as a guide  -  -        Nutrition Goals Discharge (Final Nutrition Goals Re-Evaluation): Nutrition Goals Re-Evaluation - 01/29/18 1030      Goals   Nutrition Goal  When eating out ask for gravies and sauces on the side, order non-fried items, ask for no extra salt to be added to the food; Eat breakfast an hour later on days you are coming to exercise to avoid low blood sugar; limit food high in sodium and potassium    Comment  He and his wife are learning to make healthier choices when  eating out with success. He states that his wife is even starting to make healthy choices too. He feels pleased with the changes he has made and feels that his appetite has improved slightly. He has been eating breakfast "a little" later than normal on class days, and also brings candy with him to class if he feels that his blood sugar is dropping    Expected Outcome  Maintain CBW +/- 5#, continue to limit sodium and potassium prn and to make health-conscious food and beverage choices when eating out       Psychosocial: Target Goals: Acknowledge presence or absence of  significant depression and/or stress, maximize coping skills, provide positive support system. Participant is able to verbalize types and ability to use techniques and skills needed for reducing stress and depression.   Initial Review & Psychosocial Screening: Initial Psych Review & Screening - 11/19/17 1254      Initial Review   Current issues with  Current Sleep Concerns;Current Stress Concerns   Olsen reported sleeping decent before his heart attack. After staying in the hospital for 2 weeks and then in rehab for another 2 weeks, his sleep schedule got messed up. He is taking melatonin about every night, some nights it helps others not so much.    Source of Stress Concerns  Chronic Illness    Comments  Damonie has the beginning dementia. He feels like he has a pretty good grasp on the process and is doing everything he can to help himself. He also was put on dialysis during his hospital stay, but was taken off by discharge. He is very pleased with how his kidney function and A1C has improved in the last couple weeks. He and his wife love to travel so he is focusing on getting stronger so they can continue.       Family Dynamics   Good Support System?  Yes   wife and sons     Barriers   Psychosocial barriers to participate in program  There are no identifiable barriers or psychosocial needs.;The patient should benefit from training  in stress management and relaxation.      Screening Interventions   Interventions  Encouraged to exercise;Program counselor consult;To provide support and resources with identified psychosocial needs;Provide feedback about the scores to participant    Expected Outcomes  Short Term goal: Utilizing psychosocial counselor, staff and physician to assist with identification of specific Stressors or current issues interfering with healing process. Setting desired goal for each stressor or current issue identified.;Long Term Goal: Stressors or current issues are controlled or eliminated.;Short Term goal: Identification and review with participant of any Quality of Life or Depression concerns found by scoring the questionnaire.;Long Term goal: The participant improves quality of Life and PHQ9 Scores as seen by post scores and/or verbalization of changes       Quality of Life Scores:  Quality of Life - 11/19/17 1056      Quality of Life   Select  Quality of Life      Quality of Life Scores   Health/Function Pre  22.85 %    Socioeconomic Pre  29.17 %    Psych/Spiritual Pre  24.64 %    Family Pre  22.8 %    GLOBAL Pre  25.44 %      Scores of 19 and below usually indicate a poorer quality of life in these areas.  A difference of  2-3 points is a clinically meaningful difference.  A difference of 2-3 points in the total score of the Quality of Life Index has been associated with significant improvement in overall quality of life, self-image, physical symptoms, and general health in studies assessing change in quality of life.  PHQ-9: Recent Review Flowsheet Data    Depression screen [facility] 2/9 12/27/2017 11/19/2017 06/05/2017 06/05/2017 02/26/2017   Decreased Interest 1 2 0 0 0   Down, Depressed, Hopeless 1 0 0 0 0   PHQ - 2 Score 2 2 0 0 0   Altered sleeping 1 3 - - -   Tired, decreased energy 0 3 - - -   Change in appetite  0 1 - - -   Feeling bad or failure about yourself  0 1 - - -   Trouble  concentrating 0 0 - - -   Moving slowly or fidgety/restless 0 0 - - -   Suicidal thoughts 0 0 - - -   PHQ-9 Score 3 10 - - -   Difficult doing work/chores Somewhat difficult Somewhat difficult - - -     Interpretation of Total Score  Total Score Depression Severity:  1-4 = Minimal depression, 5-9 = Mild depression, 10-14 = Moderate depression, 15-19 = Moderately severe depression, 20-27 = Severe depression   Psychosocial Evaluation and Intervention: Psychosocial Evaluation - 12/20/17 0951      Psychosocial Evaluation & Interventions   Interventions  Relaxation education;Encouraged to exercise with the program and follow exercise prescription    Comments  Counselor met with Mr. Carneiro Hoffman) today for initial psychosocial evaluation.  He is an 85 year old who had a heart attack and stent inserted this past August 17th.  He reports having kidney failure - but it is improving since the stent was placed.  He mentioned having been diagnosed recently with "mild dementia" and is on medication for this - and reports the medication being helpful.  Vencent has a strong support system with a spouse of 68 years and (2) sons.  He also has grandchildren and great grandchildren who "light up his life" as he talked about them.  Yasuo reports not sleeping well for quite some time, but especially since the procedure in August.  He may speak with his Dr. about ordering a sleep study.  He reports his appetite is improving as of late.  Ohn denies a history of depression or anxiety or any current symptoms and is typically in a positive mood.  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  Arris has goals to walk better and "get rid of this cane" while in this program.  Staff will follow with him.     Expected Outcomes  Short:  Edin will exercise regularly to increase his stamina and strength and improve his core ability to walk better.  He will also mention to his Dr. about  the need for a sleep study to determine if there is a problem preventing him from sleeping well.   Long:  Berry will continue to practice positive self-care habits of exercise and healthy eating for his health and mental health.    Continue Psychosocial Services   Follow up required by staff       Psychosocial Re-Evaluation: Psychosocial Re-Evaluation    [place] Name 12/27/17 1020 01/17/18 1015           Psychosocial Re-Evaluation   Current issues with  Current Stress Concerns;Current Sleep Concerns  Current Stress Concerns      Comments  Faustino has been doing good mentally.  His biggest problem has been his sleep.  He has not talked to his doctor about it yet, but has started to use his sleep app.   His has noticed that he has an inconsistent  pattern.  He will talk to his doctor about a sleep study as this has been going on since he got out of the hospital.  Reviewed patient health questionnaire (PHQ-9) with patient for follow up. Previously, patients score indicated signs/symptoms of depression.  Reviewed to see if patient is improving symptom wise while in program.  Score improved and  patient states that it is because they have been more active and coming to class.   Martyn reports some family problems, specifically his grandson's health. He is going to [place] to see him and try to help him. He reports that his individual stress level is good. Sleep is still a problem has been since hospitilzation. He has stopped taking Melatonin because hedidn't like it. He is using app on his phone to track sleep. He has to get up to go to the bathroom as well. He is going to try deep breathing and counting to try to get himself back to sleep.      Expected Outcomes  Short: Talk to doctor about sleep study.  Long: Continue to exercise and work on sleeping better.   Short: Almir has not been to his PCP to ask about sleep study yet. He will when he goes. Long: Continue exercising and working on relaxing and getting  back to sleep.         Psychosocial Discharge (Final Psychosocial Re-Evaluation): Psychosocial Re-Evaluation - 01/17/18 1015      Psychosocial Re-Evaluation   Current issues with  Current Stress Concerns    Comments  Rahm reports some family problems, specifically his grandson's health. He is going to [place] to see him and try to help him. He reports that his individual stress level is good. Sleep is still a problem has been since hospitilzation. He has stopped taking Melatonin because hedidn't like it. He is using app on his phone to track sleep. He has to get up to go to the bathroom as well. He is going to try deep breathing and counting to try to get himself back to sleep.    Expected Outcomes  Short: Tarin has not been to his PCP to ask about sleep study yet. He will when he goes. Long: Continue exercising and working on relaxing and getting back to sleep.       Vocational Rehabilitation: Provide vocational rehab assistance to qualifying candidates.   Vocational Rehab Evaluation & Intervention: Vocational Rehab - 11/19/17 1254      Initial Vocational Rehab Evaluation & Intervention   Assessment shows need for Vocational Rehabilitation  No       Education: Education Goals: Education classes will be provided on a variety of topics geared toward better understanding of heart health and risk factor modification. Participant will state understanding/return demonstration of topics presented as noted by education test scores.  Learning Barriers/Preferences: Learning Barriers/Preferences - 11/19/17 1254      Learning Barriers/Preferences   Learning Barriers  Hearing    Learning Preferences  Verbal Instruction;Written Material       Education Topics:  AED/CPR: - Group verbal and written instruction with the use of models to demonstrate the basic use of the AED with the basic ABC's of resuscitation.   Cardiac Rehab from 02/12/2018 in [facility] Cardiac and Pulmonary Rehab  Date   02/12/18  Educator  CE  Instruction Review Code  1- Verbalizes Understanding      General Nutrition Guidelines/Fats and Fiber: -Group instruction provided by verbal, written material, models and posters to present the general guidelines for heart healthy nutrition. Gives an explanation and review of dietary fats and fiber.   Cardiac Rehab from 02/12/2018 in [facility] Cardiac and Pulmonary Rehab  Date  12/18/17  Educator  LB  Instruction Review Code  1- Verbalizes Understanding      Controlling Sodium/Reading Food Labels: -Group verbal and written material supporting the discussion  of sodium use in heart healthy nutrition. Review and explanation with models, verbal and written materials for utilization of the food label.   Cardiac Rehab from 02/12/2018 in [facility] Cardiac and Pulmonary Rehab  Date  12/20/17  Educator  LB  Instruction Review Code  1- Verbalizes Understanding      Exercise Physiology & General Exercise Guidelines: - Group verbal and written instruction with models to review the exercise physiology of the cardiovascular system and associated critical values. Provides general exercise guidelines with specific guidelines to those with heart or lung disease.    Cardiac Rehab from 02/12/2018 in [facility] Cardiac and Pulmonary Rehab  Date  01/01/18  Educator  [facility]  Instruction Review Code  1- Verbalizes Understanding      Aerobic Exercise & Resistance Training: - Gives group verbal and written instruction on the various components of exercise. Focuses on aerobic and resistive training programs and the benefits of this training and how to safely progress through these programs..   Flexibility, Balance, Mind/Body Relaxation: Provides group verbal/written instruction on the benefits of flexibility and balance training, including mind/body exercise modes such as yoga, pilates and tai chi.  Demonstration and skill practice provided.   Cardiac Rehab from 02/12/2018 in [facility] Cardiac and  Pulmonary Rehab  Date  01/08/18  Educator  AS  Instruction Review Code  1- Verbalizes Understanding      Stress and Anxiety: - Provides group verbal and written instruction about the health risks of elevated stress and causes of high stress.  Discuss the correlation between heart/lung disease and anxiety and treatment options. Review healthy ways to manage with stress and anxiety.   Depression: - Provides group verbal and written instruction on the correlation between heart/lung disease and depressed mood, treatment options, and the stigmas associated with seeking treatment.   Cardiac Rehab from 02/12/2018 in [facility] Cardiac and Pulmonary Rehab  Date  01/17/18  Educator  [facility]  Instruction Review Code  1- Verbalizes Understanding      Anatomy & Physiology of the Heart: - Group verbal and written instruction and models provide basic cardiac anatomy and physiology, with the coronary electrical and arterial systems. Review of Valvular disease and Heart Failure   Cardiac Rehab from 02/12/2018 in [facility] Cardiac and Pulmonary Rehab  Date  11/29/17  Educator  CE  Instruction Review Code  1- Verbalizes Understanding      Cardiac Procedures: - Group verbal and written instruction to review commonly prescribed medications for heart disease. Reviews the medication, class of the drug, and side effects. Includes the steps to properly store meds and maintain the prescription regimen. (beta blockers and nitrates)   Cardiac Medications I: - Group verbal and written instruction to review commonly prescribed medications for heart disease. Reviews the medication, class of the drug, and side effects. Includes the steps to properly store meds and maintain the prescription regimen.   Cardiac Rehab from 02/12/2018 in [facility] Cardiac and Pulmonary Rehab  Date  01/29/18  Educator  KS  Instruction Review Code  1- Verbalizes Understanding      Cardiac Medications II: -Group verbal and written instruction to  review commonly prescribed medications for heart disease. Reviews the medication, class of the drug, and side effects. (all other drug classes)   Cardiac Rehab from 02/12/2018 in [facility] Cardiac and Pulmonary Rehab  Date  01/15/18  Educator  SB  Instruction Review Code  1- Verbalizes Understanding       Go Sex-Intimacy & Heart Disease, Get SMART - Goal Setting: -  Group verbal and written instruction through game format to discuss heart disease and the return to sexual intimacy. Provides group verbal and written material to discuss and apply goal setting through the application of the S.M.A.R.T. Method.   Other Matters of the Heart: - Provides group verbal, written materials and models to describe Stable Angina and Peripheral Artery. Includes description of the disease process and treatment options available to the cardiac patient.   Cardiac Rehab from 02/12/2018 in [facility] Cardiac and Pulmonary Rehab  Date  11/29/17  Educator  CE  Instruction Review Code  1- Verbalizes Understanding      Exercise & Equipment Safety: - Individual verbal instruction and demonstration of equipment use and safety with use of the equipment.   Cardiac Rehab from 02/12/2018 in [facility] Cardiac and Pulmonary Rehab  Date  11/19/17  Educator  [facility]  Instruction Review Code  1- Verbalizes Understanding      Infection Prevention: - Provides verbal and written material to individual with discussion of infection control including proper hand washing and proper equipment cleaning during exercise session.   Cardiac Rehab from 02/12/2018 in [facility] Cardiac and Pulmonary Rehab  Date  11/19/17  Educator  [facility]  Instruction Review Code  1- Verbalizes Understanding      Falls Prevention: - Provides verbal and written material to individual with discussion of falls prevention and safety.   Cardiac Rehab from 02/12/2018 in [facility] Cardiac and Pulmonary Rehab  Date  11/19/17  Educator  [facility]  Instruction Review Code  1- Verbalizes  Understanding      Diabetes: - Individual verbal and written instruction to review signs/symptoms of diabetes, desired ranges of glucose level fasting, after meals and with exercise. Acknowledge that pre and post exercise glucose checks will be done for 3 sessions at entry of program.   Cardiac Rehab from 02/12/2018 in [facility] Cardiac and Pulmonary Rehab  Date  11/19/17  Educator  [facility]  Instruction Review Code  1- Verbalizes Understanding      Know Your Numbers and Risk Factors: -Group verbal and written instruction about important numbers in your health.  Discussion of what are risk factors and how they play a role in the disease process.  Review of Cholesterol, Blood Pressure, Diabetes, and BMI and the role they play in your overall health.   Cardiac Rehab from 02/12/2018 in [facility] Cardiac and Pulmonary Rehab  Date  01/15/18  Educator  SB  Instruction Review Code  1- Verbalizes Understanding      Sleep Hygiene: -Provides group verbal and written instruction about how sleep can affect your health.  Define sleep hygiene, discuss sleep cycles and impact of sleep habits. Review good sleep hygiene tips.    Other: -Provides group and verbal instruction on various topics (see comments)   Knowledge Questionnaire Score: Knowledge Questionnaire Score - 11/19/17 1057      Knowledge Questionnaire Score   Pre Score  19/26   Test reviewed with pt today.  Education Focus: A&P, Angina, Nutrition, Exercise, and Tobacco      Core Components/Risk Factors/Patient Goals at Admission: Personal Goals and Risk Factors at Admission - 11/19/17 1253      Core Components/Risk Factors/Patient Goals on Admission    Weight Management  Yes;Weight Maintenance    Intervention  Weight Management: Develop a combined nutrition and exercise program designed to reach desired caloric intake, while maintaining appropriate intake of nutrient and fiber, sodium and fats, and appropriate energy expenditure required for  the weight goal.;Weight Management: Provide education and  appropriate resources to help participant work on and attain dietary goals.    Admit Weight  153 lb (69.4 kg)   wants to stay between 150-155 lb   Expected Outcomes  Short Term: Continue to assess and modify interventions until short term weight is achieved;Long Term: Adherence to nutrition and physical activity/exercise program aimed toward attainment of established weight goal;Weight Maintenance: Understanding of the daily nutrition guidelines, which includes 25-35% calories from fat, 7% or less cal from saturated fats, less than 242m cholesterol, less than 1.5gm of sodium, & 5 or more servings of fruits and vegetables daily;Understanding recommendations for meals to include 15-35% energy as protein, 25-35% energy from fat, 35-60% energy from carbohydrates, less than 2053mof dietary cholesterol, 20-35 gm of total fiber daily;Understanding of distribution of calorie intake throughout the day with the consumption of 4-5 meals/snacks    Diabetes  Yes    Intervention  Provide education about signs/symptoms and action to take for hypo/hyperglycemia.;Provide education about proper nutrition, including hydration, and aerobic/resistive exercise prescription along with prescribed medications to achieve blood glucose in normal ranges: Fasting glucose 65-99 mg/dL    Expected Outcomes  Short Term: Participant verbalizes understanding of the signs/symptoms and immediate care of hyper/hypoglycemia, proper foot care and importance of medication, aerobic/resistive exercise and nutrition plan for blood glucose control.;Long Term: Attainment of HbA1C < 7%.    Hypertension  Yes    Intervention  Provide education on lifestyle modifcations including regular physical activity/exercise, weight management, moderate sodium restriction and increased consumption of fresh fruit, vegetables, and low fat dairy, alcohol moderation, and smoking cessation.;Monitor prescription  use compliance.    Expected Outcomes  Short Term: Continued assessment and intervention until BP is < 140/9061mG in hypertensive participants. < 130/10m73m in hypertensive participants with diabetes, heart failure or chronic kidney disease.;Long Term: Maintenance of blood pressure at goal levels.    Lipids  Yes    Intervention  Provide education and support for participant on nutrition & aerobic/resistive exercise along with prescribed medications to achieve LDL <70mg42mL >40mg.3mExpected Outcomes  Short Term: Participant states understanding of desired cholesterol values and is compliant with medications prescribed. Participant is following exercise prescription and nutrition guidelines.;Long Term: Cholesterol controlled with medications as prescribed, with individualized exercise RX and with personalized nutrition plan. Value goals: LDL < 70mg, 83m> 40 mg.       Core Components/Risk Factors/Patient Goals Review:  Goals and Risk Factor Review    Row Name 12/27/17 1022 01/17/18 1022           Core Components/Risk Factors/Patient Goals Review   Personal Goals Review  Weight Management/Obesity;Hypertension;Diabetes;Lipids  Weight Management/Obesity;Hypertension;Lipids;Diabetes      Review  Gee isSafwanng well with his weight and has gained some of his strength back.  He is feeling better and watching his diet.  His kidney function numbers have started to improve as well.  He is doing well with his blood sugars. His A1c is down to 6.2.  He checks them about every other day at home.  He is has been checking his blood pressure  at home daily.  It has been good here, but he is getting higher reading at home. We talked about bringing his cuff in for us to [place] to.  He is doing well with his medicaitons.   Lijah's weight is staying about the same and he is satisfied with that. He weighted around 170 lbs before hospitalization and has changed his diet and  now holding steady weight around 150lbs. Amore  brought his BP machine in to verify accuracy and found that his home machine was not accurate. He purchased a new machine and is checking BP's at home. He is taking his medications as prescribed. Blood sugars are good, last A1C was a 6.1 which is better than it has been in a long time. Also reports that GFR is best it's beenin 3 years and his MD said he doesn't have to worry about dialysis. This makes him very happy!      Expected Outcomes  Short: Bring blood pressure cuff in to check against what we are getting.  Long: Continue to work on maintaining weight and watching risk factors.   Short: Continue taking meds, checking BP, and Blood glucose levels at home. Long: Continue to exercise, eat right, and live healthy to control his risk factors.         Core Components/Risk Factors/Patient Goals at Discharge (Final Review):  Goals and Risk Factor Review - 01/17/18 1022      Core Components/Risk Factors/Patient Goals Review   Personal Goals Review  Weight Management/Obesity;Hypertension;Lipids;Diabetes    Review  Shubham's weight is staying about the same and he is satisfied with that. He weighted around 170 lbs before hospitalization and has changed his diet and now holding steady weight around 150lbs. Aasim brought his BP machine in to verify accuracy and found that his home machine was not accurate. He purchased a new machine and is checking BP's at home. He is taking his medications as prescribed. Blood sugars are good, last A1C was a 6.1 which is better than it has been in a long time. Also reports that GFR is best it's beenin 3 years and his MD said he doesn't have to worry about dialysis. This makes him very happy!    Expected Outcomes  Short: Continue taking meds, checking BP, and Blood glucose levels at home. Long: Continue to exercise, eat right, and live healthy to control his risk factors.       ITP Comments: ITP Comments    Row Name 11/19/17 1249 11/21/17 0645 12/19/17 0606 01/16/18 3009  02/12/18 0639   ITP Comments  Med Review completed. Initial ITP created. Diagnosis can be found in [facility] Encounter 8/17  30 day review.  Continue with ITP unless directed changes per Medical Director review.  30 day review. Continue with ITP unless direccted changes per Medical Director Chart Review.  30 day review. Continue with ITP unless direccted changes per Medical Director Chart Review.  30 Day Review. Continue with ITP unless directed changes per Medical Director review      Comments: Discharge ITP

## 2018-02-14 NOTE — Patient Instructions (Addendum)
Ray Lamb , Thank you for taking time to come for your Medicare Wellness Visit. I appreciate your ongoing commitment to your health goals. Please review the following plan we discussed and let me know if I can assist you in the future.   Screening recommendations/referrals: Colonoscopy excluded, over age 83 Recommended yearly ophthalmology/optometry visit for glaucoma screening and checkup Recommended yearly dental visit for hygiene and checkup  Vaccinations: Influenza vaccine up to date Pneumococcal vaccine up to date, completed Tdap vaccine up to date, due 10/05/2025 Shingles vaccine due, (Shringrix)  ordered to pharmacy. This 2 shots that are 2-6 months apart  Advanced directives: Please bring [place] a copy of your living will and health care power of attorney once it is completed and notarized  Conditions/risks identified: none  Next appointment: Dr. Jeananne Rama 03/14/2018 @ 10am           Medicare Wellness Visit 02/17/2019 @ 2:30pm  Preventive Care 63 Years and Older, Male Preventive care refers to lifestyle choices and visits with your health care provider that can promote health and wellness. What does preventive care include?  A yearly physical exam. This is also called an annual well check.  Dental exams once or twice a year.  Routine eye exams. Ask your health care provider how often you should have your eyes checked.  Personal lifestyle choices, including:  Daily care of your teeth and gums.  Regular physical activity.  Eating a healthy diet.  Avoiding tobacco and drug use.  Limiting alcohol use.  Practicing safe sex.  Taking low doses of aspirin every day.  Taking vitamin and mineral supplements as recommended by your health care provider. What happens during an annual well check? The services and screenings done by your health care provider during your annual well check will depend on your age, overall health, lifestyle risk factors, and family history of  disease. Counseling  Your health care provider may ask you questions about your:  Alcohol use.  Tobacco use.  Drug use.  Emotional well-being.  Home and relationship well-being.  Sexual activity.  Eating habits.  History of falls.  Memory and ability to understand (cognition).  Work and work Statistician. Screening  You may have the following tests or measurements:  Height, weight, and BMI.  Blood pressure.  Lipid and cholesterol levels. These may be checked every 5 years, or more frequently if you are over 2 years old.  Skin check.  Lung cancer screening. You may have this screening every year starting at age 68 if you have a 30-pack-year history of smoking and currently smoke or have quit within the past 15 years.  Fecal occult blood test (FOBT) of the stool. You may have this test every year starting at age 12.  Flexible sigmoidoscopy or colonoscopy. You may have a sigmoidoscopy every 5 years or a colonoscopy every 10 years starting at age 82.  Prostate cancer screening. Recommendations will vary depending on your family history and other risks.  Hepatitis C blood test.  Hepatitis B blood test.  Sexually transmitted disease (STD) testing.  Diabetes screening. This is done by checking your blood sugar (glucose) after you have not eaten for a while (fasting). You may have this done every 1-3 years.  Abdominal aortic aneurysm (AAA) screening. You may need this if you are a current or former smoker.  Osteoporosis. You may be screened starting at age 22 if you are at high risk. Talk with your health care provider about your test results, treatment options, and  if necessary, the need for more tests. Vaccines  Your health care provider may recommend certain vaccines, such as:  Influenza vaccine. This is recommended every year.  Tetanus, diphtheria, and acellular pertussis (Tdap, Td) vaccine. You may need a Td booster every 10 years.  Zoster vaccine. You may  need this after age 97.  Pneumococcal 13-valent conjugate (PCV13) vaccine. One dose is recommended after age 39.  Pneumococcal polysaccharide (PPSV23) vaccine. One dose is recommended after age 75. Talk to your health care provider about which screenings and vaccines you need and how often you need them. This information is not intended to replace advice given to you by your health care provider. Make sure you discuss any questions you have with your health care provider. Document Released: 02/26/2015 Document Revised: 10/20/2015 Document Reviewed: 12/01/2014 Elsevier Interactive Patient Education  2017 [place] Prevention in the Home Falls can cause injuries. They can happen to people of all ages. There are many things you can do to make your home safe and to help prevent falls. What can I do on the outside of my home?  Regularly fix the edges of walkways and driveways and fix any cracks.  Remove anything that might make you trip as you walk through a door, such as a raised step or threshold.  Trim any bushes or trees on the path to your home.  Use bright outdoor lighting.  Clear any walking paths of anything that might make someone trip, such as rocks or tools.  Regularly check to see if handrails are loose or broken. Make sure that both sides of any steps have handrails.  Any raised decks and porches should have guardrails on the edges.  Have any leaves, snow, or ice cleared regularly.  Use sand or salt on walking paths during winter.  Clean up any spills in your garage right away. This includes oil or grease spills. What can I do in the bathroom?  Use night lights.  Install grab bars by the toilet and in the tub and shower. Do not use towel bars as grab bars.  Use non-skid mats or decals in the tub or shower.  If you need to sit down in the shower, use a plastic, non-slip stool.  Keep the floor dry. Clean up any water that spills on the floor as soon as it  happens.  Remove soap buildup in the tub or shower regularly.  Attach bath mats securely with double-sided non-slip rug tape.  Do not have throw rugs and other things on the floor that can make you trip. What can I do in the bedroom?  Use night lights.  Make sure that you have a light by your bed that is easy to reach.  Do not use any sheets or blankets that are too big for your bed. They should not hang down onto the floor.  Have a firm chair that has side arms. You can use this for support while you get dressed.  Do not have throw rugs and other things on the floor that can make you trip. What can I do in the kitchen?  Clean up any spills right away.  Avoid walking on wet floors.  Keep items that you use a lot in easy-to-reach places.  If you need to reach something above you, use a strong step stool that has a grab bar.  Keep electrical cords out of the way.  Do not use floor polish or wax that makes floors slippery. If you  must use wax, use non-skid floor wax.  Do not have throw rugs and other things on the floor that can make you trip. What can I do with my stairs?  Do not leave any items on the stairs.  Make sure that there are handrails on both sides of the stairs and use them. Fix handrails that are broken or loose. Make sure that handrails are as long as the stairways.  Check any carpeting to make sure that it is firmly attached to the stairs. Fix any carpet that is loose or worn.  Avoid having throw rugs at the top or bottom of the stairs. If you do have throw rugs, attach them to the floor with carpet tape.  Make sure that you have a light switch at the top of the stairs and the bottom of the stairs. If you do not have them, ask someone to add them for you. What else can I do to help prevent falls?  Wear shoes that:  Do not have high heels.  Have rubber bottoms.  Are comfortable and fit you well.  Are closed at the toe. Do not wear sandals.  If you  use a stepladder:  Make sure that it is fully opened. Do not climb a closed stepladder.  Make sure that both sides of the stepladder are locked into place.  Ask someone to hold it for you, if possible.  Clearly mark and make sure that you can see:  Any grab bars or handrails.  First and last steps.  Where the edge of each step is.  Use tools that help you move around (mobility aids) if they are needed. These include:  Canes.  Walkers.  Scooters.  Crutches.  Turn on the lights when you go into a dark area. Replace any light bulbs as soon as they burn out.  Set up your furniture so you have a clear path. Avoid moving your furniture around.  If any of your floors are uneven, fix them.  If there are any pets around you, be aware of where they are.  Review your medicines with your doctor. Some medicines can make you feel dizzy. This can increase your chance of falling. Ask your doctor what other things that you can do to help prevent falls. This information is not intended to replace advice given to you by your health care provider. Make sure you discuss any questions you have with your health care provider. Document Released: 11/26/2008 Document Revised: 07/08/2015 Document Reviewed: 03/06/2014 Elsevier Interactive Patient Education  2017 [employer].

## 2018-02-14 NOTE — Progress Notes (Signed)
Daily Session Note  Patient Details  Name: Ray Lamb MRN: [id number] Date of Birth: Nov 28, 1930 Referring Provider:     Cardiac Rehab from 11/19/2017 in [facility]iac [facility]  Referring Provider  Neoma Laming MD      Encounter Date: 02/14/2018  Check In: Session Check In - 02/14/18 0940      Check-In   Supervising physician immediately available to respond to emergencies  See telemetry face sheet for immediately available ER MD    Location  [facility]-Cardiac & Pulmonary Rehab    Staff Present  Gerlene Burdock, RN, BSN;Jeanna Durrell BS, Exercise Physiologist;Emina Ribaudo Tessie Fass RCP,RRT,BSRT    Medication changes reported      No    Fall or balance concerns reported     No    Warm-up and Cool-down  Performed as group-led instruction    Resistance Training Performed  Yes    VAD Patient?  No    PAD/SET Patient?  No      Pain Assessment   Currently in Pain?  No/denies          Social History   Tobacco Use  Smoking Status Former Smoker  . Last attempt to quit: 09/02/1964  . Years since quitting: 53.4  Smokeless Tobacco Never Used    Goals Met:  Independence with exercise equipment Exercise tolerated well No report of cardiac concerns or symptoms Strength training completed today  Goals Unmet:  Not Applicable  Comments:  Ray Lamb graduated today from  rehab with 35 sessions completed.  Details of the patient's exercise prescription and what He needs to do in order to continue the prescription and progress were discussed with patient.  Patient was given a copy of prescription and goals.  Patient verbalized understanding.  Ray Lamb plans to continue to exercise by joining the [place] [facility].Pt able to follow exercise prescription today without complaint.  Will continue to monitor for progression.    Dr. Emily Filbert is Medical Director for [facility].

## 2018-02-14 NOTE — Patient Instructions (Signed)
Discharge Patient Instructions  Patient Details  Name: Ray Lamb MRN: [id number] Date of Birth: 10-04-1930 Referring Provider:  Isaias Cowman, MD   Number of Visits: 42  Reason for Discharge:  Patient reached a stable level of exercise. Patient independent in their exercise. Patient has met program and personal goals.  Smoking History:  Social History   Tobacco Use  Smoking Status Former Smoker  . Last attempt to quit: 09/02/1964  . Years since quitting: 53.4  Smokeless Tobacco Never Used    Diagnosis:  NSTEMI (non-ST elevated myocardial infarction) [facility])  Initial Exercise Prescription: Initial Exercise Prescription - 11/19/17 1200      Date of Initial Exercise RX and Referring Provider   Date  11/19/17    Referring Provider  Neoma Laming MD      Treadmill   MPH  0.8    Grade  0.5    Minutes  15    METs  1.7      Recumbant Bike   Level  1    RPM  50    Watts  10    Minutes  15    METs  1.7      NuStep   Level  1    SPM  80    Minutes  15    METs  1.7      Prescription Details   Frequency (times per week)  2    Duration  Progress to 30 minutes of continuous aerobic without signs/symptoms of physical distress      Intensity   THRR 40-80% of Max Heartrate  92-121    Ratings of Perceived Exertion  11-13    Perceived Dyspnea  0-4      Progression   Progression  Continue to progress workloads to maintain intensity without signs/symptoms of physical distress.      Resistance Training   Training Prescription  Yes    Weight  3 lbs    Reps  10-15       Discharge Exercise Prescription (Final Exercise Prescription Changes): Exercise Prescription Changes - 02/05/18 1000      Response to Exercise   Blood Pressure (Admit)  126/58    Blood Pressure (Exercise)  136/68    Blood Pressure (Exit)  128/60    Heart Rate (Admit)  49 bpm    Heart Rate (Exercise)  75 bpm    Heart Rate (Exit)  71 bpm    Rating of Perceived Exertion (Exercise)  12     Symptoms  none    Duration  Continue with 30 min of aerobic exercise without signs/symptoms of physical distress.    Intensity  THRR unchanged      Progression   Progression  Continue to progress workloads to maintain intensity without signs/symptoms of physical distress.    Average METs  2.47      Resistance Training   Training Prescription  Yes    Weight  3 lbs    Reps  10-15      Interval Training   Interval Training  No      Treadmill   MPH  1.2    Grade  0.5    Minutes  15    METs  2      Recumbant Bike   Level  5    Watts  18    Minutes  15    METs  2.9      NuStep   Level  5    Minutes  15    METs  2.5      Home Exercise Plan   Plans to continue exercise at  [facility] (comment)   treadmill, recumbent bike, and Nustep at the [place]   Frequency  Add 2 additional days to program exercise sessions.    Initial Home Exercises Provided  12/13/17       Functional Capacity: 6 Minute Walk    Row Name 11/19/17 1219 02/07/18 1225       6 Minute Walk   Phase  Initial  Discharge    Distance  585 feet  870 feet    Distance % Change  -  48.7 %    Distance Feet Change  -  285 ft    Walk Time  6 minutes  6 minutes    # of Rest Breaks  0  0    MPH  1.11  1.21    METS  0.95  1.65    RPE  12  13    VO2 Peak  3.31  4.23    Symptoms  No  No    Resting HR  63 bpm  59 bpm    Resting BP  122/64  126/60    Resting Oxygen Saturation   100 %  -    Exercise Oxygen Saturation  during 6 min walk  100 %  -    Max Ex. HR  100 bpm  80 bpm    Max Ex. BP  134/76  132/64    2 Minute Post BP  124/64  -       Quality of Life: Quality of Life - 11/19/17 1056      Quality of Life   Select  Quality of Life      Quality of Life Scores   Health/Function Pre  22.85 %    Socioeconomic Pre  29.17 %    Psych/Spiritual Pre  24.64 %    Family Pre  22.8 %    GLOBAL Pre  25.44 %       Personal Goals: Goals established at orientation with interventions provided to work  toward goal. Personal Goals and Risk Factors at Admission - 11/19/17 1253      Core Components/Risk Factors/Patient Goals on Admission    Weight Management  Yes;Weight Maintenance    Intervention  Weight Management: Develop a combined nutrition and exercise program designed to reach desired caloric intake, while maintaining appropriate intake of nutrient and fiber, sodium and fats, and appropriate energy expenditure required for the weight goal.;Weight Management: Provide education and appropriate resources to help participant work on and attain dietary goals.    Admit Weight  153 lb (69.4 kg)   wants to stay between 150-155 lb   Expected Outcomes  Short Term: Continue to assess and modify interventions until short term weight is achieved;Long Term: Adherence to nutrition and physical activity/exercise program aimed toward attainment of established weight goal;Weight Maintenance: Understanding of the daily nutrition guidelines, which includes 25-35% calories from fat, 7% or less cal from saturated fats, less than 260m cholesterol, less than 1.5gm of sodium, & 5 or more servings of fruits and vegetables daily;Understanding recommendations for meals to include 15-35% energy as protein, 25-35% energy from fat, 35-60% energy from carbohydrates, less than 2066mof dietary cholesterol, 20-35 gm of total fiber daily;Understanding of distribution of calorie intake throughout the day with the consumption of 4-5 meals/snacks    Diabetes  Yes    Intervention  Provide education about signs/symptoms  and action to take for hypo/hyperglycemia.;Provide education about proper nutrition, including hydration, and aerobic/resistive exercise prescription along with prescribed medications to achieve blood glucose in normal ranges: Fasting glucose 65-99 mg/dL    Expected Outcomes  Short Term: Participant verbalizes understanding of the signs/symptoms and immediate care of hyper/hypoglycemia, proper foot care and importance of  medication, aerobic/resistive exercise and nutrition plan for blood glucose control.;Long Term: Attainment of HbA1C < 7%.    Hypertension  Yes    Intervention  Provide education on lifestyle modifcations including regular physical activity/exercise, weight management, moderate sodium restriction and increased consumption of fresh fruit, vegetables, and low fat dairy, alcohol moderation, and smoking cessation.;Monitor prescription use compliance.    Expected Outcomes  Short Term: Continued assessment and intervention until BP is < 140/27m HG in hypertensive participants. < 130/843mHG in hypertensive participants with diabetes, heart failure or chronic kidney disease.;Long Term: Maintenance of blood pressure at goal levels.    Lipids  Yes    Intervention  Provide education and support for participant on nutrition & aerobic/resistive exercise along with prescribed medications to achieve LDL <7064mHDL >41m47m  Expected Outcomes  Short Term: Participant states understanding of desired cholesterol values and is compliant with medications prescribed. Participant is following exercise prescription and nutrition guidelines.;Long Term: Cholesterol controlled with medications as prescribed, with individualized exercise RX and with personalized nutrition plan. Value goals: LDL < 70mg87mL > 40 mg.        Personal Goals Discharge: Goals and Risk Factor Review - 01/17/18 1022      Core Components/Risk Factors/Patient Goals Review   Personal Goals Review  Weight Management/Obesity;Hypertension;Lipids;Diabetes    Review  Ray Lamb's weight is staying about the same and he is satisfied with that. He weighted around 170 lbs before hospitalization and has changed his diet and now holding steady weight around 150lbs. Ray Lamb his BP machine in to verify accuracy and found that his home machine was not accurate. He purchased a new machine and is checking BP's at home. He is taking his medications as prescribed. Blood  sugars are good, last A1C was a 6.1 which is better than it has been in a long time. Also reports that GFR is best it's beenin 3 years and his MD said he doesn't have to worry about dialysis. This makes him very happy!    Expected Outcomes  Short: Continue taking meds, checking BP, and Blood glucose levels at home. Long: Continue to exercise, eat right, and live healthy to control his risk factors.       Exercise Goals and Review: Exercise Goals    Row Name 11/19/17 1226             Exercise Goals   Increase Physical Activity  Yes       Intervention  Provide advice, education, support and counseling about physical activity/exercise needs.;Develop an individualized exercise prescription for aerobic and resistive training based on initial evaluation findings, risk stratification, comorbidities and participant's personal goals.       Expected Outcomes  Short Term: Attend rehab on a regular basis to increase amount of physical activity.;Long Term: Add in home exercise to make exercise part of routine and to increase amount of physical activity.;Long Term: Exercising regularly at least 3-5 days a week.       Increase Strength and Stamina  Yes       Intervention  Provide advice, education, support and counseling about physical activity/exercise needs.;Develop an individualized exercise prescription for aerobic and  resistive training based on initial evaluation findings, risk stratification, comorbidities and participant's personal goals.       Expected Outcomes  Short Term: Increase workloads from initial exercise prescription for resistance, speed, and METs.;Short Term: Perform resistance training exercises routinely during rehab and add in resistance training at home;Long Term: Improve cardiorespiratory fitness, muscular endurance and strength as measured by increased METs and functional capacity (6MWT)       Able to understand and use rate of perceived exertion (RPE) scale  Yes       Intervention   Provide education and explanation on how to use RPE scale       Expected Outcomes  Short Term: Able to use RPE daily in rehab to express subjective intensity level;Long Term:  Able to use RPE to guide intensity level when exercising independently       Knowledge and understanding of Target Heart Rate Range (THRR)  Yes       Intervention  Provide education and explanation of THRR including how the numbers were predicted and where they are located for reference       Expected Outcomes  Short Term: Able to state/look up THRR;Short Term: Able to use daily as guideline for intensity in rehab;Long Term: Able to use THRR to govern intensity when exercising independently       Able to check pulse independently  Yes       Intervention  Provide education and demonstration on how to check pulse in carotid and radial arteries.;Review the importance of being able to check your own pulse for safety during independent exercise       Expected Outcomes  Short Term: Able to explain why pulse checking is important during independent exercise;Long Term: Able to check pulse independently and accurately       Understanding of Exercise Prescription  Yes       Intervention  Provide education, explanation, and written materials on patient's individual exercise prescription       Expected Outcomes  Short Term: Able to explain program exercise prescription;Long Term: Able to explain home exercise prescription to exercise independently          Exercise Goals Re-Evaluation: Exercise Goals Re-Evaluation    Row Name 11/29/17 0926 12/11/17 1546 12/13/17 1010 12/25/17 1631 12/27/17 1018     Exercise Goal Re-Evaluation   Exercise Goals Review  Increase Physical Activity;Increase Strength and Stamina;Able to understand and use rate of perceived exertion (RPE) scale;Knowledge and understanding of Target Heart Rate Range (THRR);Understanding of Exercise Prescription  Increase Physical Activity;Increase Strength and  Stamina;Understanding of Exercise Prescription  Increase Physical Activity;Able to understand and use rate of perceived exertion (RPE) scale;Knowledge and understanding of Target Heart Rate Range (THRR);Increase Strength and Stamina;Able to check pulse independently;Understanding of Exercise Prescription  Increase Physical Activity;Increase Strength and Stamina;Understanding of Exercise Prescription  Increase Physical Activity;Increase Strength and Stamina;Understanding of Exercise Prescription   Comments  Reviewed RPE scale, THR and program prescription with pt today.  Pt voiced understanding and was given a copy of goals to take home.  Ray Lamb is off to a good start in rehab.  He is already up to level 2 on the recumbent bike.  We will continue monitor his progression.   Reviewed home exercise with pt today.  Pt plans to exercise at [facility] using the treadmill, recumbent bike, NuStep and hand weights for exercise.  Reviewed THR, pulse, RPE, sign and symptoms, NTG use, and when to call 911 or MD.  Also discussed weather  considerations and indoor options.  Pt voiced understanding.  Ray Lamb has been doing Lamb in rehab.  He is up to 22 watts at level 3 on the recumbent bike.  We will continue to monitor progression.   Ray Lamb is doing Lamb in rehab.  He is feeling pretty good with his exercise.  He is starting to feel stronger and has more stamina.  He has even started to leave his cane in one spot while in class.  He continues to go gym on his off days  for 5 days a week and does 69mn at the gym usually treadmill and NuStep.    Expected Outcomes  Short: Use RPE daily to regulate intensity. Long: Follow program prescription in THR.  Short: Review home exercise guidelines  Long: Continue increase strength and stamina  Short: Ray Lamb going to add 1-2 days and eventually 3 [facility] of [facility] [employer] [facility], recumbent bike, Nustep, and hand weights. Long: Continue exercising outside of class and walking 30  minutes on the treadmill without stopping.   Short: Continue to push him to do more.  Long: Continue to exercise on his own on off days.   Short: Increase treadmill at gym like he did here.  Long: Continue to exercise on his own.    [place] 01/07/18 1658 01/17/18 1032 02/05/18 1050         Exercise Goal Re-Evaluation   Exercise Goals Review  Increase Physical Activity;Increase Strength and Stamina;Understanding of Exercise Prescription  Increase Physical Activity;Increase Strength and Stamina;Able to understand and use rate of perceived exertion (RPE) scale;Knowledge and understanding of Target Heart Rate Range (THRR);Able to check pulse independently  Increase Physical Activity;Increase Strength and Stamina;Understanding of Exercise Prescription     Comments  Ray Lamb in rehab.  He is up to 1.2 mph on the treadmill now.  We will continue to monitor his progress.   Ray Lamb in rehab. He has increased his work load on the Nustep to Level 3. We will continue to monitor his progress. He is going to the gym 2 extra days. His cardiologist is going to do a stress test in February.  Ray Lamb to do Lamb in rehab.  He is is up to level 5 on the bike and doing the same at the gym.  We will will continue to monitor his progress.      Expected Outcomes  Short: Continue to walk more. Long: Continue to increase strength and stamina.   Short: Continue to increase home exercise. Long: Continue to increase work loads and graduate from Cardiac Rehab but continue exercising beyond that.  Short: Try to increase speed on treadmill.  Long: Continue to exercise on off days.         Nutrition & Weight - Outcomes: Pre Biometrics - 11/19/17 1227      Pre Biometrics   Height  5' 5.3" (1.659 m)    Weight  153 lb 8 oz (69.6 kg)    Waist Circumference  35 inches    Hip Circumference  39 inches    Waist to Hip Ratio  0.9 %    BMI (Calculated)  25.3    Single Leg Stand  1.58 seconds      Post  Biometrics - 02/07/18 1226       Post  Biometrics   Height  5' 5.3" (1.659 m)    Weight  156 lb (70.8 kg)    Waist Circumference  35  inches    Hip Circumference  39 inches    Waist to Hip Ratio  0.9 %    BMI (Calculated)  25.71    Single Leg Stand  1.38 seconds       Nutrition: Nutrition Therapy & Goals - 11/29/17 1249      Nutrition Therapy   Diet  DM    Drug/Food Interactions  Statins/Certain Fruits    Protein (specify units)  10-11oz    Fiber  30 grams    Whole Grain Foods  3 servings   chooses multigrain low sodium bread   Saturated Fats  13 max. grams    Fruits and Vegetables  5 servings/day   8 ideal; eats fruits and vegetables regularly but eats only small portions   Sodium  1500 grams      Personal Nutrition Goals   Nutrition Goal  Eat breakfast an hour later on the mornings you are coming to class to exercise. This will help prevent episodes of low blood sugar intra/post exertion    Personal Goal #2  Limit foods high in sodium and potassium. Use the packet provided as a guide    Personal Goal #3  When eating out, ask for gravies and sauces on the side, order non-fried items, and/or ask for no extra salt to be added to meals    Comments  He and his wife have been trying to minimize sodium intake, but they eat out frequently. They choose low sodium breakfast meats, bread, and canned items for meals at home. Typically eats breakfast and dinner with a light lunch or mid afternoon snack. He is regaining his appetite s/p hospital and rehab stay but still eats small portions. His wife is trying to cook with salt-free herbs and spices but needs further instruction.      Intervention Plan   Intervention  Prescribe, educate and counsel regarding individualized specific dietary modifications aiming towards targeted core components such as weight, hypertension, lipid management, diabetes, heart failure and other comorbidities.;Nutrition handout(s) given to patient.   Salt free  seasoning blends handout; DM type II and CKD nutrition therapy packet   Expected Outcomes  Short Term Goal: Understand basic principles of dietary content, such as calories, fat, sodium, cholesterol and nutrients.;Short Term Goal: A plan has been developed with personal nutrition goals set during dietitian appointment.;Long Term Goal: Adherence to prescribed nutrition plan.       Nutrition Discharge: Nutrition Assessments - 11/19/17 1258      MEDFICTS Scores   Pre Score  29       Education Questionnaire Score: Knowledge Questionnaire Score - 11/19/17 1057      Knowledge Questionnaire Score   Pre Score  19/26   Test reviewed with pt today.  Education Focus: A&P, Angina, Nutrition, Exercise, and Tobacco      Goals reviewed with patient; copy given to patient.

## 2018-02-14 NOTE — Progress Notes (Signed)
Discharge Progress Report  Patient Details  Name: Ray Lamb MRN: [id number] Date of Birth: 02-15-1930 Referring Provider:     Cardiac Rehab from 11/19/2017 in [facility]  Referring Provider  Neoma Laming MD       Number of Visits: 35/36  Reason for Discharge:  Patient reached a stable level of exercise. Patient independent in their exercise. Patient has met program and personal goals.  Smoking History:  Social History   Tobacco Use  Smoking Status Former Smoker  . Last attempt to quit: 09/02/1964  . Years since quitting: 53.4  Smokeless Tobacco Never Used    Diagnosis:  NSTEMI (non-ST elevated myocardial infarction) ([place])  ADL [facility]:   Initial Exercise Prescription: Initial Exercise Prescription - 11/19/17 1200      Date of Initial Exercise RX and Referring Provider   Date  11/19/17    Referring Provider  Neoma Laming MD      Treadmill   MPH  0.8    Grade  0.5    Minutes  15    METs  1.7      Recumbant Bike   Level  1    RPM  50    Watts  10    Minutes  15    METs  1.7      NuStep   Level  1    SPM  80    Minutes  15    METs  1.7      Prescription Details   Frequency (times per week)  2    Duration  Progress to 30 minutes of continuous aerobic without signs/symptoms of physical distress      Intensity   THRR 40-80% of Max Heartrate  92-121    Ratings of Perceived Exertion  11-13    Perceived Dyspnea  0-4      Progression   Progression  Continue to progress workloads to maintain intensity without signs/symptoms of physical distress.      Resistance Training   Training Prescription  Yes    Weight  3 lbs    Reps  10-15       Discharge Exercise Prescription (Final Exercise Prescription Changes): Exercise Prescription Changes - 02/05/18 1000      Response to Exercise   Blood Pressure (Admit)  126/58    Blood Pressure (Exercise)  136/68    Blood Pressure (Exit)  128/60    Heart Rate (Admit)  49 bpm    Heart Rate  (Exercise)  75 bpm    Heart Rate (Exit)  71 bpm    Rating of Perceived Exertion (Exercise)  12    Symptoms  none    Duration  Continue with 30 min of aerobic exercise without signs/symptoms of physical distress.    Intensity  THRR unchanged      Progression   Progression  Continue to progress workloads to maintain intensity without signs/symptoms of physical distress.    Average METs  2.47      Resistance Training   Training Prescription  Yes    Weight  3 lbs    Reps  10-15      Interval Training   Interval Training  No      Treadmill   MPH  1.2    Grade  0.5    Minutes  15    METs  2      Recumbant Bike   Level  5    Watts  18  Minutes  15    METs  2.9      NuStep   Level  5    Minutes  15    METs  2.5      Home Exercise Plan   Plans to continue exercise at  [facility] (comment)   treadmill, recumbent bike, and Nustep at the [place]   Frequency  Add 2 additional days to program exercise sessions.    Initial Home Exercises Provided  12/13/17       Functional Capacity: 6 Minute Walk    Row Name 11/19/17 1219 02/07/18 1225       6 Minute Walk   Phase  Initial  Discharge    Distance  585 feet  870 feet    Distance % Change  -  48.7 %    Distance Feet Change  -  285 ft    Walk Time  6 minutes  6 minutes    # of Rest Breaks  0  0    MPH  1.11  1.21    METS  0.95  1.65    RPE  12  13    VO2 Peak  3.31  4.23    Symptoms  No  No    Resting HR  63 bpm  59 bpm    Resting BP  122/64  126/60    Resting Oxygen Saturation   100 %  -    Exercise Oxygen Saturation  during 6 min walk  100 %  -    Max Ex. HR  100 bpm  80 bpm    Max Ex. BP  134/76  132/64    2 Minute Post BP  124/64  -       Psychological, QOL, Others - Outcomes: PHQ 2/9: Depression screen [facility] 2/9 12/27/2017 11/19/2017 06/05/2017 06/05/2017 02/26/2017  Decreased Interest 1 2 0 0 0  Down, Depressed, Hopeless 1 0 0 0 0  PHQ - 2 Score 2 2 0 0 0  Altered sleeping 1 3 - - -  Tired, decreased  energy 0 3 - - -  Change in appetite 0 1 - - -  Feeling bad or failure about yourself  0 1 - - -  Trouble concentrating 0 0 - - -  Moving slowly or fidgety/restless 0 0 - - -  Suicidal thoughts 0 0 - - -  PHQ-9 Score 3 10 - - -  Difficult doing work/chores Somewhat difficult Somewhat difficult - - -    Quality of Life: Quality of Life - 11/19/17 1056      Quality of Life   Select  Quality of Life      Quality of Life Scores   Health/Function Pre  22.85 %    Socioeconomic Pre  29.17 %    Psych/Spiritual Pre  24.64 %    Family Pre  22.8 %    GLOBAL Pre  25.44 %       Personal Goals: Goals established at orientation with interventions provided to work toward goal. Personal Goals and Risk Factors at Admission - 11/19/17 1253      Core Components/Risk Factors/Patient Goals on Admission    Weight Management  Yes;Weight Maintenance    Intervention  Weight Management: Develop a combined nutrition and exercise program designed to reach desired caloric intake, while maintaining appropriate intake of nutrient and fiber, sodium and fats, and appropriate energy expenditure required for the weight goal.;Weight Management: Provide education and appropriate resources to help participant  work on and attain dietary goals.    Admit Weight  153 lb (69.4 kg)   wants to stay between 150-155 lb   Expected Outcomes  Short Term: Continue to assess and modify interventions until short term weight is achieved;Long Term: Adherence to nutrition and physical activity/exercise program aimed toward attainment of established weight goal;Weight Maintenance: Understanding of the daily nutrition guidelines, which includes 25-35% calories from fat, 7% or less cal from saturated fats, less than 231m cholesterol, less than 1.5gm of sodium, & 5 or more servings of fruits and vegetables daily;Understanding recommendations for meals to include 15-35% energy as protein, 25-35% energy from fat, 35-60% energy from  carbohydrates, less than 2034mof dietary cholesterol, 20-35 gm of total fiber daily;Understanding of distribution of calorie intake throughout the day with the consumption of 4-5 meals/snacks    Diabetes  Yes    Intervention  Provide education about signs/symptoms and action to take for hypo/hyperglycemia.;Provide education about proper nutrition, including hydration, and aerobic/resistive exercise prescription along with prescribed medications to achieve blood glucose in normal ranges: Fasting glucose 65-99 mg/dL    Expected Outcomes  Short Term: Participant verbalizes understanding of the signs/symptoms and immediate care of hyper/hypoglycemia, proper foot care and importance of medication, aerobic/resistive exercise and nutrition plan for blood glucose control.;Long Term: Attainment of HbA1C < 7%.    Hypertension  Yes    Intervention  Provide education on lifestyle modifcations including regular physical activity/exercise, weight management, moderate sodium restriction and increased consumption of fresh fruit, vegetables, and low fat dairy, alcohol moderation, and smoking cessation.;Monitor prescription use compliance.    Expected Outcomes  Short Term: Continued assessment and intervention until BP is < 140/9012mG in hypertensive participants. < 130/70m48m in hypertensive participants with diabetes, heart failure or chronic kidney disease.;Long Term: Maintenance of blood pressure at goal levels.    Lipids  Yes    Intervention  Provide education and support for participant on nutrition & aerobic/resistive exercise along with prescribed medications to achieve LDL <70mg45mL >40mg.37mExpected Outcomes  Short Term: Participant states understanding of desired cholesterol values and is compliant with medications prescribed. Participant is following exercise prescription and nutrition guidelines.;Long Term: Cholesterol controlled with medications as prescribed, with individualized exercise RX and with  personalized nutrition plan. Value goals: LDL < 70mg, 77m> 40 mg.        Personal Goals Discharge: Goals and Risk Factor Review    Row Name 12/27/17 1022 01/17/18 1022           Core Components/Risk Factors/Patient Goals Review   Personal Goals Review  Weight Management/Obesity;Hypertension;Diabetes;Lipids  Weight Management/Obesity;Hypertension;Lipids;Diabetes      Review  Aryeh isAhmarionng well with his weight and has gained some of his strength back.  He is feeling better and watching his diet.  His kidney function numbers have started to improve as well.  He is doing well with his blood sugars. His A1c is down to 6.2.  He checks them about every other day at home.  He is has been checking his blood pressure  at home daily.  It has been good here, but he is getting higher reading at home. We talked about bringing his cuff in for us to [place] to.  He is doing well with his medicaitons.   Conrado's weight is staying about the same and he is satisfied with that. He weighted around 170 lbs before hospitalization and has changed his diet and now holding steady weight around 150lbs.  Coran brought his BP machine in to verify accuracy and found that his home machine was not accurate. He purchased a new machine and is checking BP's at home. He is taking his medications as prescribed. Blood sugars are good, last A1C was a 6.1 which is better than it has been in a long time. Also reports that GFR is best it's beenin 3 years and his MD said he doesn't have to worry about dialysis. This makes him very happy!      Expected Outcomes  Short: Bring blood pressure cuff in to check against what we are getting.  Long: Continue to work on maintaining weight and watching risk factors.   Short: Continue taking meds, checking BP, and Blood glucose levels at home. Long: Continue to exercise, eat right, and live healthy to control his risk factors.         Exercise Goals and Review: Exercise Goals    Row Name 11/19/17 1226              Exercise Goals   Increase Physical Activity  Yes       Intervention  Provide advice, education, support and counseling about physical activity/exercise needs.;Develop an individualized exercise prescription for aerobic and resistive training based on initial evaluation findings, risk stratification, comorbidities and participant's personal goals.       Expected Outcomes  Short Term: Attend rehab on a regular basis to increase amount of physical activity.;Long Term: Add in home exercise to make exercise part of routine and to increase amount of physical activity.;Long Term: Exercising regularly at least 3-5 days a week.       Increase Strength and Stamina  Yes       Intervention  Provide advice, education, support and counseling about physical activity/exercise needs.;Develop an individualized exercise prescription for aerobic and resistive training based on initial evaluation findings, risk stratification, comorbidities and participant's personal goals.       Expected Outcomes  Short Term: Increase workloads from initial exercise prescription for resistance, speed, and METs.;Short Term: Perform resistance training exercises routinely during rehab and add in resistance training at home;Long Term: Improve cardiorespiratory fitness, muscular endurance and strength as measured by increased METs and functional capacity (6MWT)       Able to understand and use rate of perceived exertion (RPE) scale  Yes       Intervention  Provide education and explanation on how to use RPE scale       Expected Outcomes  Short Term: Able to use RPE daily in rehab to express subjective intensity level;Long Term:  Able to use RPE to guide intensity level when exercising independently       Knowledge and understanding of Target Heart Rate Range (THRR)  Yes       Intervention  Provide education and explanation of THRR including how the numbers were predicted and where they are located for reference       Expected  Outcomes  Short Term: Able to state/look up THRR;Short Term: Able to use daily as guideline for intensity in rehab;Long Term: Able to use THRR to govern intensity when exercising independently       Able to check pulse independently  Yes       Intervention  Provide education and demonstration on how to check pulse in carotid and radial arteries.;Review the importance of being able to check your own pulse for safety during independent exercise       Expected Outcomes  Short Term: Able to explain  why pulse checking is important during independent exercise;Long Term: Able to check pulse independently and accurately       Understanding of Exercise Prescription  Yes       Intervention  Provide education, explanation, and written materials on patient's individual exercise prescription       Expected Outcomes  Short Term: Able to explain program exercise prescription;Long Term: Able to explain home exercise prescription to exercise independently          Exercise Goals Re-Evaluation: Exercise Goals Re-Evaluation    Row Name 11/29/17 0926 12/11/17 1546 12/13/17 1010 12/25/17 1631 12/27/17 1018     Exercise Goal Re-Evaluation   Exercise Goals Review  Increase Physical Activity;Increase Strength and Stamina;Able to understand and use rate of perceived exertion (RPE) scale;Knowledge and understanding of Target Heart Rate Range (THRR);Understanding of Exercise Prescription  Increase Physical Activity;Increase Strength and Stamina;Understanding of Exercise Prescription  Increase Physical Activity;Able to understand and use rate of perceived exertion (RPE) scale;Knowledge and understanding of Target Heart Rate Range (THRR);Increase Strength and Stamina;Able to check pulse independently;Understanding of Exercise Prescription  Increase Physical Activity;Increase Strength and Stamina;Understanding of Exercise Prescription  Increase Physical Activity;Increase Strength and Stamina;Understanding of Exercise Prescription    Comments  Reviewed RPE scale, THR and program prescription with pt today.  Pt voiced understanding and was given a copy of goals to take home.  Davaughn is off to a good start in rehab.  He is already up to level 2 on the recumbent bike.  We will continue monitor his progression.   Reviewed home exercise with pt today.  Pt plans to exercise at [facility] using the treadmill, recumbent bike, NuStep and hand weights for exercise.  Reviewed THR, pulse, RPE, sign and symptoms, NTG use, and when to call 911 or MD.  Also discussed weather considerations and indoor options.  Pt voiced understanding.  Gram has been doing well in rehab.  He is up to 22 watts at level 3 on the recumbent bike.  We will continue to monitor progression.   Antino is doing well in rehab.  He is feeling pretty good with his exercise.  He is starting to feel stronger and has more stamina.  He has even started to leave his cane in one spot while in class.  He continues to go gym on his off days  for 5 days a week and does 34mn at the gym usually treadmill and NuStep.    Expected Outcomes  Short: Use RPE daily to regulate intensity. Long: Follow program prescription in THR.  Short: Review home exercise guidelines  Long: Continue increase strength and stamina  Short: HSenais going to add 1-2 days and eventually 3 days of exercise [facility] [employer] [facility], recumbent bike, Nustep, and hand weights. Long: Continue exercising outside of class and walking 30 minutes on the treadmill without stopping.   Short: Continue to push him to do more.  Long: Continue to exercise on his own on off days.   Short: Increase treadmill at gym like he did here.  Long: Continue to exercise on his own.    RCollinsName 01/07/18 1658 01/17/18 1032 02/05/18 1050         Exercise Goal Re-Evaluation   Exercise Goals Review  Increase Physical Activity;Increase Strength and Stamina;Understanding of Exercise Prescription  Increase Physical Activity;Increase Strength and  Stamina;Able to understand and use rate of perceived exertion (RPE) scale;Knowledge and understanding of Target Heart Rate Range (THRR);Able to check pulse independently  Increase Physical Activity;Increase Strength and Stamina;Understanding of Exercise Prescription     Comments  Hawkin is doing well in rehab.  He is up to 1.2 mph on the treadmill now.  We will continue to monitor his progress.   Teddie is doing well in rehab. He has increased his work load on the Nustep to Level 3. We will continue to monitor his progress. He is going to the gym 2 extra days. His cardiologist is going to do a stress test in February.  Chaynce continues to do well in rehab.  He is is up to level 5 on the bike and doing the same at the gym.  We will will continue to monitor his progress.      Expected Outcomes  Short: Continue to walk more. Long: Continue to increase strength and stamina.   Short: Continue to increase home exercise. Long: Continue to increase work loads and graduate from Cardiac Rehab but continue exercising beyond that.  Short: Try to increase speed on treadmill.  Long: Continue to exercise on off days.         Nutrition & Weight - Outcomes: Pre Biometrics - 11/19/17 1227      Pre Biometrics   Height  5' 5.3" (1.659 m)    Weight  153 lb 8 oz (69.6 kg)    Waist Circumference  35 inches    Hip Circumference  39 inches    Waist to Hip Ratio  0.9 %    BMI (Calculated)  25.3    Single Leg Stand  1.58 seconds      Post Biometrics - 02/07/18 1226       Post  Biometrics   Height  5' 5.3" (1.659 m)    Weight  156 lb (70.8 kg)    Waist Circumference  35 inches    Hip Circumference  39 inches    Waist to Hip Ratio  0.9 %    BMI (Calculated)  25.71    Single Leg Stand  1.38 seconds       Nutrition: Nutrition Therapy & Goals - 11/29/17 1249      Nutrition Therapy   Diet  DM    Drug/Food Interactions  Statins/Certain Fruits    Protein (specify units)  10-11oz    Fiber  30 grams    Whole Grain  Foods  3 servings   chooses multigrain low sodium bread   Saturated Fats  13 max. grams    Fruits and Vegetables  5 servings/day   8 ideal; eats fruits and vegetables regularly but eats only small portions   Sodium  1500 grams      Personal Nutrition Goals   Nutrition Goal  Eat breakfast an hour later on the mornings you are coming to class to exercise. This will help prevent episodes of low blood sugar intra/post exertion    Personal Goal #2  Limit foods high in sodium and potassium. Use the packet provided as a guide    Personal Goal #3  When eating out, ask for gravies and sauces on the side, order non-fried items, and/or ask for no extra salt to be added to meals    Comments  He and his wife have been trying to minimize sodium intake, but they eat out frequently. They choose low sodium breakfast meats, bread, and canned items for meals at home. Typically eats breakfast and dinner with a light lunch or mid afternoon snack. He is regaining his appetite s/p hospital and rehab stay but  still eats small portions. His wife is trying to cook with salt-free herbs and spices but needs further instruction.      Intervention Plan   Intervention  Prescribe, educate and counsel regarding individualized specific dietary modifications aiming towards targeted core components such as weight, hypertension, lipid management, diabetes, heart failure and other comorbidities.;Nutrition handout(s) given to patient.   Salt free seasoning blends handout; DM type II and CKD nutrition therapy packet   Expected Outcomes  Short Term Goal: Understand basic principles of dietary content, such as calories, fat, sodium, cholesterol and nutrients.;Short Term Goal: A plan has been developed with personal nutrition goals set during dietitian appointment.;Long Term Goal: Adherence to prescribed nutrition plan.       Nutrition Discharge: Nutrition Assessments - 11/19/17 1258      MEDFICTS Scores   Pre Score  29        Education Questionnaire Score: Knowledge Questionnaire Score - 11/19/17 1057      Knowledge Questionnaire Score   Pre Score  19/26   Test reviewed with pt today.  Education Focus: A&P, Angina, Nutrition, Exercise, and Tobacco      Goals reviewed with patient; copy given to patient.

## 2018-02-21 ENCOUNTER — Other Ambulatory Visit: Payer: Self-pay | Admitting: Family Medicine

## 2018-02-21 DIAGNOSIS — E7800 Pure hypercholesterolemia, unspecified: Secondary | ICD-10-CM

## 2018-02-28 DIAGNOSIS — N184 Chronic kidney disease, stage 4 (severe): Secondary | ICD-10-CM | POA: Diagnosis not present

## 2018-02-28 DIAGNOSIS — E875 Hyperkalemia: Secondary | ICD-10-CM | POA: Diagnosis not present

## 2018-02-28 DIAGNOSIS — E1122 Type 2 diabetes mellitus with diabetic chronic kidney disease: Secondary | ICD-10-CM | POA: Diagnosis not present

## 2018-02-28 DIAGNOSIS — R600 Localized edema: Secondary | ICD-10-CM | POA: Diagnosis not present

## 2018-02-28 DIAGNOSIS — I10 Essential (primary) hypertension: Secondary | ICD-10-CM | POA: Diagnosis not present

## 2018-03-04 DIAGNOSIS — R600 Localized edema: Secondary | ICD-10-CM | POA: Diagnosis not present

## 2018-03-04 DIAGNOSIS — E1129 Type 2 diabetes mellitus with other diabetic kidney complication: Secondary | ICD-10-CM | POA: Diagnosis not present

## 2018-03-04 DIAGNOSIS — N184 Chronic kidney disease, stage 4 (severe): Secondary | ICD-10-CM | POA: Diagnosis not present

## 2018-03-04 DIAGNOSIS — I129 Hypertensive chronic kidney disease with stage 1 through stage 4 chronic kidney disease, or unspecified chronic kidney disease: Secondary | ICD-10-CM | POA: Diagnosis not present

## 2018-03-14 ENCOUNTER — Ambulatory Visit (INDEPENDENT_AMBULATORY_CARE_PROVIDER_SITE_OTHER): Payer: Medicare Other | Admitting: Family Medicine

## 2018-03-14 ENCOUNTER — Encounter: Payer: Self-pay | Admitting: Family Medicine

## 2018-03-14 ENCOUNTER — Other Ambulatory Visit: Payer: Self-pay

## 2018-03-14 DIAGNOSIS — F0280 Dementia in other diseases classified elsewhere without behavioral disturbance: Secondary | ICD-10-CM

## 2018-03-14 DIAGNOSIS — E782 Mixed hyperlipidemia: Secondary | ICD-10-CM | POA: Diagnosis not present

## 2018-03-14 DIAGNOSIS — I129 Hypertensive chronic kidney disease with stage 1 through stage 4 chronic kidney disease, or unspecified chronic kidney disease: Secondary | ICD-10-CM | POA: Diagnosis not present

## 2018-03-14 DIAGNOSIS — N184 Chronic kidney disease, stage 4 (severe): Secondary | ICD-10-CM

## 2018-03-14 DIAGNOSIS — E1122 Type 2 diabetes mellitus with diabetic chronic kidney disease: Secondary | ICD-10-CM | POA: Diagnosis not present

## 2018-03-14 DIAGNOSIS — G300 Alzheimer's disease with early onset: Secondary | ICD-10-CM

## 2018-03-14 DIAGNOSIS — E7800 Pure hypercholesterolemia, unspecified: Secondary | ICD-10-CM | POA: Diagnosis not present

## 2018-03-14 DIAGNOSIS — G4700 Insomnia, unspecified: Secondary | ICD-10-CM | POA: Insufficient documentation

## 2018-03-14 DIAGNOSIS — D692 Other nonthrombocytopenic purpura: Secondary | ICD-10-CM | POA: Diagnosis not present

## 2018-03-14 DIAGNOSIS — I272 Pulmonary hypertension, unspecified: Secondary | ICD-10-CM

## 2018-03-14 DIAGNOSIS — Z7189 Other specified counseling: Secondary | ICD-10-CM | POA: Diagnosis not present

## 2018-03-14 LAB — BAYER DCA HB A1C WAIVED: HB A1C: 5.8 % (ref <7.0)

## 2018-03-14 MED ORDER — ATORVASTATIN CALCIUM 80 MG PO TABS: 80.0000 mg | ORAL_TABLET | Freq: Every day | ORAL | 4 refills | Status: DC

## 2018-03-14 MED ORDER — MIRTAZAPINE 15 MG PO TABS: 15.0000 mg | ORAL_TABLET | Freq: Every day | ORAL | 6 refills | Status: DC

## 2018-03-14 MED ORDER — EZETIMIBE 10 MG PO TABS: 10.0000 mg | ORAL_TABLET | Freq: Every day | ORAL | 4 refills | Status: DC

## 2018-03-14 NOTE — Assessment & Plan Note (Signed)
The current medical regimen is effective;  continue present plan and medications.  

## 2018-03-14 NOTE — Assessment & Plan Note (Signed)
A voluntary discussion about advanced care planning including explanation and discussion of advanced directives was extentively discussed with the patient.  Explained about the healthcare proxy and living will was reviewed and packet with forms with expiration of how to fill them out was given.  Time spent: Encounter 16+ min individuals present: Patient 

## 2018-03-14 NOTE — Progress Notes (Signed)
BP 130/60 (BP Location: Left Arm)   Pulse 75   Temp (!) 97.5 F (36.4 C)   Ht 5\' 5"  (1.651 m)   Wt 152 lb 2 oz (69 kg)   SpO2 98%   BMI 25.31 kg/m    Subjective:    Patient ID: Ray Lamb, male    DOB: Oct 29, 1930, 85 y.o.   MRN: [id number]  HPI: Ray Lamb is a 85 y.o. male   Annual wellness  Chief Complaint  Patient presents with  . Diabetes  Doing well with diabetes no low blood sugar spells or issues. Patient's biggest concern is poor sleep is able to fall asleep okay around 10:50 PM but then wakes up about 3 AM for the rest of the night has heard about Ambien and is interested in trying that but is open for other suggestions. Has tried melatonin but his real problem is waking up and staying asleep not going to sleep. Blood pressure cholesterol doing well. Taking Aricept for memory which seems to be doing okay  Relevant past medical, surgical, family and social history reviewed and updated as indicated. Interim medical history since our last visit reviewed. Allergies and medications reviewed and updated.  Review of Systems  Per HPI unless specifically indicated above     Objective:    BP 130/60 (BP Location: Left Arm)   Pulse 75   Temp (!) 97.5 F (36.4 C)   Ht 5\' 5"  (1.651 m)   Wt 152 lb 2 oz (69 kg)   SpO2 98%   BMI 25.31 kg/m   Wt Readings from Last 3 Encounters:  03/14/18 152 lb 2 oz (69 kg)  02/14/18 154 lb (69.9 kg)  02/07/18 156 lb (70.8 kg)    Physical Exam Constitutional:      Appearance: He is well-developed.  HENT:     Head: Normocephalic and atraumatic.     Right Ear: External ear normal.     Left Ear: External ear normal.  Eyes:     Conjunctiva/sclera: Conjunctivae normal.     Pupils: Pupils are equal, round, and reactive to light.  Neck:     Musculoskeletal: Normal range of motion and neck supple.  Cardiovascular:     Rate and Rhythm: Normal rate and regular rhythm.     Heart sounds: Murmur present.     Comments: 3/6  SM Pulmonary:     Effort: Pulmonary effort is normal.     Breath sounds: Normal breath sounds.  Abdominal:     General: Bowel sounds are normal.     Palpations: Abdomen is soft. There is no hepatomegaly or splenomegaly.  Genitourinary:    Penis: Normal.      Prostate: Normal.     Rectum: Normal.  Musculoskeletal: Normal range of motion.  Skin:    Findings: No erythema or rash.  Neurological:     Mental Status: He is alert and oriented to person, place, and time.     Deep Tendon Reflexes: Reflexes are normal and symmetric.  Psychiatric:        Behavior: Behavior normal.        Thought Content: Thought content normal.        Judgment: Judgment normal.     Results for orders placed or performed in visit on 12/06/17  Glucose, capillary  Result Value Ref Range   Glucose-Capillary 88 70 - 99 mg/dL      Assessment & Plan:   Problem List Items Addressed This Visit  Cardiovascular and Mediastinum   Senile purpura ([place])    stable      Pulmonary HTN ([place])    Stable followed by cardiology      Benign hypertension with CKD (chronic kidney disease) stage IV ([place])    Stable blood pressure and followed by nephrologist which is stable.        Endocrine   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 disease ([place])    The current medical regimen is effective;  continue present plan and medications.         Nervous and Auditory   Alzheimer's dementia [facility])    The current medical regimen is effective;  continue present plan and medications.         Other   Hyperlipidemia   Advance care planning    A voluntary discussion about advanced care planning including explanation and discussion of advanced directives was extentively discussed with the patient.  Explained about the healthcare proxy and living will was reviewed and packet with forms with expiration of how to fill them out was given.  Time spent: Encounter 16+ min individuals present: Patient      Mixed  hyperlipidemia    The current medical regimen is effective;  continue present plan and medications.       Insomnia    Will use Remeron 15 mg          Follow up plan: Return in about 6 months (around 09/12/2018) for Hemoglobin A1c, BMP,  Lipids, ALT, AST.

## 2018-03-14 NOTE — Assessment & Plan Note (Signed)
Will use Remeron 15 mg

## 2018-03-14 NOTE — Assessment & Plan Note (Signed)
Stable followed by cardiology

## 2018-03-14 NOTE — Assessment & Plan Note (Signed)
stable °

## 2018-03-14 NOTE — Assessment & Plan Note (Signed)
Stable blood pressure and followed by nephrologist which is stable.

## 2018-03-15 ENCOUNTER — Encounter: Payer: Self-pay | Admitting: Family Medicine

## 2018-03-15 LAB — CBC WITH DIFFERENTIAL/PLATELET
BASOS: 1 %
Basophils Absolute: 0.1 10*3/uL (ref 0.0–0.2)
EOS (ABSOLUTE): 0.3 10*3/uL (ref 0.0–0.4)
EOS: 3 %
HEMATOCRIT: 31 % — AB (ref 37.5–51.0)
HEMOGLOBIN: 10 g/dL — AB (ref 13.0–17.7)
IMMATURE GRANS (ABS): 0 10*3/uL (ref 0.0–0.1)
IMMATURE GRANULOCYTES: 0 %
LYMPHS: 32 %
Lymphocytes Absolute: 2.6 10*3/uL (ref 0.7–3.1)
MCH: 30 pg (ref 26.6–33.0)
MCHC: 32.3 g/dL (ref 31.5–35.7)
MCV: 93 fL (ref 79–97)
MONOCYTES: 11 %
Monocytes Absolute: 0.9 10*3/uL (ref 0.1–0.9)
NEUTROS ABS: 4.2 10*3/uL (ref 1.4–7.0)
Neutrophils: 53 %
Platelets: 259 10*3/uL (ref 150–450)
RBC: 3.33 x10E6/uL — ABNORMAL LOW (ref 4.14–5.80)
RDW: 12.3 % (ref 11.6–15.4)
WBC: 8.1 10*3/uL (ref 3.4–10.8)

## 2018-03-15 LAB — COMPREHENSIVE METABOLIC PANEL
A/G RATIO: 0.9 — AB (ref 1.2–2.2)
ALT: 19 IU/L (ref 0–44)
AST: 30 IU/L (ref 0–40)
Albumin: 3.8 g/dL (ref 3.6–4.6)
Alkaline Phosphatase: 82 IU/L (ref 39–117)
BUN/Creatinine Ratio: 16 (ref 10–24)
BUN: 39 mg/dL — ABNORMAL HIGH (ref 8–27)
Bilirubin Total: 0.4 mg/dL (ref 0.0–1.2)
CALCIUM: 9.1 mg/dL (ref 8.6–10.2)
CO2: 21 mmol/L (ref 20–29)
CREATININE: 2.39 mg/dL — AB (ref 0.76–1.27)
Chloride: 101 mmol/L (ref 96–106)
GFR, EST AFRICAN AMERICAN: 28 mL/min/{1.73_m2} — AB (ref >59)
GFR, EST NON AFRICAN AMERICAN: 24 mL/min/{1.73_m2} — AB (ref >59)
Globulin, Total: 4.4 g/dL (ref 1.5–4.5)
Glucose: 60 mg/dL — ABNORMAL LOW (ref 65–99)
Potassium: 5.3 mmol/L — ABNORMAL HIGH (ref 3.5–5.2)
Sodium: 137 mmol/L (ref 134–144)
TOTAL PROTEIN: 8.2 g/dL (ref 6.0–8.5)

## 2018-03-15 LAB — LIPID PANEL
Chol/HDL Ratio: 2.1 ratio (ref 0.0–5.0)
Cholesterol, Total: 111 mg/dL (ref 100–199)
HDL: 54 mg/dL (ref >39)
LDL CALC: 47 mg/dL (ref 0–99)
Triglycerides: 52 mg/dL (ref 0–149)
VLDL CHOLESTEROL CAL: 10 mg/dL (ref 5–40)

## 2018-03-15 LAB — TSH: TSH: 3.51 u[IU]/mL (ref 0.450–4.500)

## 2018-04-02 ENCOUNTER — Other Ambulatory Visit: Payer: Self-pay | Admitting: Family Medicine

## 2018-04-02 MED ORDER — SITAGLIPTIN PHOSPHATE 25 MG PO TABS: 25.0000 mg | ORAL_TABLET | Freq: Every day | ORAL | 3 refills | Status: DC

## 2018-04-02 MED ORDER — GLIPIZIDE 5 MG PO TABS: 5.0000 mg | ORAL_TABLET | Freq: Every day | ORAL | 3 refills | Status: DC

## 2018-04-02 NOTE — Telephone Encounter (Signed)
Requested medication (s) are due for refill today -yes  Requested medication (s) are on the active medication list -yes  Future visit scheduled -yes  Last refill: 4 months ago  Notes to clinic: Patient is requesting medication listed as historical- passed refill protocol-sent for PCP review   Requested Prescriptions  Pending Prescriptions Disp Refills   glipiZIDE (GLUCOTROL) 5 MG tablet      Sig: Take 1 tablet (5 mg total) by mouth daily before breakfast.     Endocrinology:  Diabetes - Sulfonylureas Passed - 04/02/2018  4:15 PM      Passed - HBA1C is between 0 and 7.9 and within 180 days    Hemoglobin A1C  Date Value Ref Range Status  10/06/2015 7.2  Final   HB A1C (BAYER DCA - WAIVED)  Date Value Ref Range Status  03/14/2018 5.8 <7.0 % Final    Comment:                                          Diabetic Adult            <7.0                                       Healthy Adult        4.3 - 5.7                                                           (DCCT/NGSP) American Diabetes Association's Summary of Glycemic Recommendations for Adults with Diabetes: Hemoglobin A1c <7.0%. More stringent glycemic goals (A1c <6.0%) may further reduce complications at the cost of increased risk of hypoglycemia.          Passed - Valid encounter within last 6 months    Recent Outpatient Visits          2 weeks ago Pure hypercholesterolemia   [place], Jeannette How, MD   3 months ago 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 disease ([place])   [facility] [place], Jeannette How, MD   4 months ago 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 disease ([place])   [place], Roe, PA-C   7 months ago 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 disease ([place])   [facility] Criss[facility], Jeannette How, MD   10 months ago Hypertensive heart and renal disease with renal failure, stage 1 through stage 4 or unspecified chronic kidney  disease, without heart failure   [facility] Crissman, Jeannette How, MD      Future Appointments            In 5 months [place], Jeannette How, MD [place], [facility]   In 10 months  [facility]          sitaGLIPtin (JANUVIA) 25 MG tablet      Sig: Take 1 tablet (25 mg total) by mouth daily.     Endocrinology:  Diabetes - DPP-4 Inhibitors Failed - 04/02/2018  4:15 PM      Failed - Cr in normal range and within 360 days  Creatinine, Ser  Date Value Ref Range Status  03/14/2018 2.39 (H) 0.76 - 1.27 mg/dL Final         Passed - HBA1C is between 0 and 7.9 and within 180 days    Hemoglobin A1C  Date Value Ref Range Status  10/06/2015 7.2  Final   HB A1C (BAYER DCA - WAIVED)  Date Value Ref Range Status  03/14/2018 5.8 <7.0 % Final    Comment:                                          Diabetic Adult            <7.0                                       Healthy Adult        4.3 - 5.7                                                           (DCCT/NGSP) American Diabetes Association's Summary of Glycemic Recommendations for Adults with Diabetes: Hemoglobin A1c <7.0%. More stringent glycemic goals (A1c <6.0%) may further reduce complications at the cost of increased risk of hypoglycemia.          Passed - Valid encounter within last 6 months    Recent Outpatient Visits          2 weeks ago Pure hypercholesterolemia   [place], Jeannette How, MD   3 months ago 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 disease ([place])   [facility] Crissman, Jeannette How, MD   4 months ago 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 disease ([place])   [place], Lilia Argue, PA-C   7 months ago 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 disease ([place])   [facility] Crissman, Jeannette How, MD   10 months ago Hypertensive heart and renal disease with renal failure, stage 1 through stage 4 or unspecified  chronic kidney disease, without heart failure   [facility] Criss[facility], Jeannette How, MD      Future Appointments            In 5 months [place], Jeannette How, MD [place], [facility]   In 36 months  [facility]            Requested Prescriptions  Pending Prescriptions Disp Refills   glipiZIDE (GLUCOTROL) 5 MG tablet      Sig: Take 1 tablet (5 mg total) by mouth daily before breakfast.     Endocrinology:  Diabetes - Sulfonylureas Passed - 04/02/2018  4:15 PM      Passed - HBA1C is between 0 and 7.9 and within 180 days    Hemoglobin A1C  Date Value Ref Range Status  10/06/2015 7.2  Final   HB A1C (BAYER DCA - WAIVED)  Date Value Ref Range Status  03/14/2018 5.8 <7.0 % Final    Comment:  Diabetic Adult            <7.0                                       Healthy Adult        4.3 - 5.7                                                           (DCCT/NGSP) American Diabetes Association's Summary of Glycemic Recommendations for Adults with Diabetes: Hemoglobin A1c <7.0%. More stringent glycemic goals (A1c <6.0%) may further reduce complications at the cost of increased risk of hypoglycemia.          Passed - Valid encounter within last 6 months    Recent Outpatient Visits          2 weeks ago Pure hypercholesterolemia   [place], Jeannette How, MD   3 months ago 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 disease ([place])   [facility] Criss[facility], Jeannette How, MD   4 months ago 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 disease ([place])   [place], [place], PA-C   7 months ago 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 disease ([place])   [facility]iss[facility], Jeannette How, MD   10 months ago Hypertensive heart and renal disease with renal failure, stage 1 through stage 4 or unspecified chronic kidney disease, without heart failure   [facility], Jeannette How, MD      Future Appointments            In 5 months [facility], Jeannette How, MD [place], [facility]   In 10 months  [facility]          sitaGLIPtin (JANUVIA) 25 MG tablet      Sig: Take 1 tablet (25 mg total) by mouth daily.     Endocrinology:  Diabetes - DPP-4 Inhibitors Failed - 04/02/2018  4:15 PM      Failed - Cr in normal range and within 360 days    Creatinine, Ser  Date Value Ref Range Status  03/14/2018 2.39 (H) 0.76 - 1.27 mg/dL Final         Passed - HBA1C is between 0 and 7.9 and within 180 days    Hemoglobin A1C  Date Value Ref Range Status  10/06/2015 7.2  Final   HB A1C (BAYER DCA - WAIVED)  Date Value Ref Range Status  03/14/2018 5.8 <7.0 % Final    Comment:                                          Diabetic Adult            <7.0                                       Healthy Adult        4.3 - 5.7                                                           (  DCCT/NGSP) American Diabetes Association's Summary of Glycemic Recommendations for Adults with Diabetes: Hemoglobin A1c <7.0%. More stringent glycemic goals (A1c <6.0%) may further reduce complications at the cost of increased risk of hypoglycemia.          Passed - Valid encounter within last 6 months    Recent Outpatient Visits          2 weeks ago Pure hypercholesterolemia   [place], Jeannette How, MD   3 months ago 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 disease ([place])   [facility], Jeannette How, MD   4 months ago 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 disease ([place])   [place], Lilia Argue, PA-C   7 months ago 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 disease ([place])   [facility]iss[facility], Jeannette How, MD   10 months ago Hypertensive heart and renal disease with renal failure, stage 1 through stage 4 or unspecified chronic kidney disease, without heart failure    [facility] [place], Jeannette How, MD      Future Appointments            In 5 months [facility], Jeannette How, MD [place]   In 109 months  [facility], [facility]

## 2018-04-02 NOTE — Telephone Encounter (Signed)
Copied from [place] [id number]. Topic: Quick Communication - Rx Refill/Question >> Apr 02, 2018  4:11 PM Sallee Provencal, [place] B, NT wrote: Medication: glipiZIDE (GLUCOTROL) 5 MG tablet sitaGLIPtin (JANUVIA) 25 MG tablet  Has the patient contacted their pharmacy? Yes.   (Agent: If no, request that the patient contact the pharmacy for the refill.) (Agent: If yes, when and what did the pharmacy advise?)  Preferred Pharmacy (with phone number or street name[facility] [place] [place]: Please be advised that RX refills may take up to 3 business days. We ask that you follow-up with your pharmacy.

## 2018-04-03 ENCOUNTER — Telehealth: Payer: Self-pay

## 2018-04-03 MED ORDER — GLIPIZIDE 5 MG PO TABS: 5.0000 mg | ORAL_TABLET | Freq: Every day | ORAL | 1 refills | Status: DC

## 2018-04-03 NOTE — Telephone Encounter (Signed)
Rx sent to mail order but [facility] - please cancel rx

## 2018-04-03 NOTE — Telephone Encounter (Signed)
[facility]

## 2018-04-03 NOTE — Telephone Encounter (Signed)
[facility] 90 day supply for Glucotrol tablets 5mg  Take 1 tablet daily before breakfast

## 2018-04-06 ENCOUNTER — Other Ambulatory Visit: Payer: Self-pay | Admitting: Family Medicine

## 2018-04-08 NOTE — Telephone Encounter (Signed)
[facility] and spoke to [place], [facility] about the refill request. Advised it was sent on 03/14/18 #30/6 refills and request came for #90/3. He says they received the refill and have enough to convert it to a 90 day supply, so disregard the request.

## 2018-04-09 ENCOUNTER — Ambulatory Visit
Admission: RE | Admit: 2018-04-09 | Discharge: 2018-04-09 | Disposition: A | Discharge status: Home / Self Care | Payer: Medicare Other | Source: Ambulatory Visit | Attending: Nurse Practitioner | Admitting: Nurse Practitioner

## 2018-04-09 ENCOUNTER — Other Ambulatory Visit: Payer: Self-pay

## 2018-04-09 ENCOUNTER — Ambulatory Visit (INDEPENDENT_AMBULATORY_CARE_PROVIDER_SITE_OTHER): Payer: Medicare Other | Admitting: Nurse Practitioner

## 2018-04-09 ENCOUNTER — Encounter: Payer: Self-pay | Admitting: Nurse Practitioner

## 2018-04-09 VITALS — BP 118/62 | HR 57 | Temp 97.8°F | Ht 65.0 in | Wt 154.2 lb

## 2018-04-09 DIAGNOSIS — G4700 Insomnia, unspecified: Secondary | ICD-10-CM

## 2018-04-09 DIAGNOSIS — R059 Cough, unspecified: Secondary | ICD-10-CM

## 2018-04-09 DIAGNOSIS — R05 Cough: Secondary | ICD-10-CM

## 2018-04-09 DIAGNOSIS — J029 Acute pharyngitis, unspecified: Secondary | ICD-10-CM | POA: Diagnosis not present

## 2018-04-09 LAB — VERITOR FLU A/B WAIVED
INFLUENZA B: NEGATIVE
Influenza A: NEGATIVE

## 2018-04-09 MED ORDER — DOXYCYCLINE HYCLATE 100 MG PO TABS: 100.0000 mg | ORAL_TABLET | Freq: Two times a day (BID) | ORAL | 0 refills | Status: AC

## 2018-04-09 NOTE — Assessment & Plan Note (Signed)
Recommend cutting Remeron dose to 7.5MG  based on age and increased lethargy with 15 MG dosing.  If continued side effects with 7.5 MG discontinue medication and use Melatonin as needed for sleep.  Recommended sleep hygiene techniques.

## 2018-04-09 NOTE — Assessment & Plan Note (Signed)
Acute present x 2 weeks.  Flu testing negative.  Due to advanced age and risk visiting wife in [place] facility will obtain CXR and initiate Doxycycline 100 MG BID x 7 days.  Recommend continued use of Coricidin at home for symptoms management and saline nasal spray for congestion.  Tylenol as needed for chills.  Avoid Ibuprofen.  Ensure plenty of rest and fluid intake at home.  Return if ongoing or worsening symptoms.

## 2018-04-09 NOTE — Patient Instructions (Signed)

## 2018-04-09 NOTE — Progress Notes (Signed)
BP 118/62 (BP Location: Left Arm, Patient Position: Sitting, Cuff Size: Normal)   Pulse (!) 57   Temp 97.8 F (36.6 C) (Oral)   Ht 5\' 5"  (1.651 m)   Wt 154 lb 4 oz (70 kg)   SpO2 99%   BMI 25.67 kg/m    Subjective:    Patient ID: Ray Lamb, male    DOB: 1930/11/11, 85 y.o.   MRN: [id number]  HPI: Ray Lamb is a 85 y.o. male  Chief Complaint  Patient presents with  . Cough    Pt states he has had it for two weeks and has taken otc meds  . chest congestion   UPPER RESPIRATORY TRACT INFECTION Reports symptoms x 2 weeks with sore throat, cough, congestion.  Reports chills occasionally, but states "no fever that I know of".  States his wife is in rehab at [facility] and that his wife "has something they are doctoring".  Denies any dysphagia episodes. Worst symptom: in the mornings coughing Fever: no, had chills Cough: yes, productive with clear  Shortness of breath: yes , sometimes a little bit Wheezing: yes, occasional Chest pain: no Chest tightness: no Chest congestion: yes Nasal congestion: yes Runny nose: yes Post nasal drip: yes Sneezing: yes Sore throat: yes Swollen glands: no Sinus pressure: no Headache: yes Face pain: no Toothache: no Ear pain: none Ear pressure: none Eyes red/itching:no Eye drainage/crusting: no  Vomiting: no Rash: no Fatigue: yes Sick contacts: yes, wife is in rehab facility (rehab and long term) Strep contacts: no  Context: fluctuating Recurrent sinusitis: no Relief with OTC cold/cough medications: yes , for a brief period and then returns Treatments attempted: Coricidin   INSOMNIA Has trouble staying asleep, but not getting to sleep.  Reports the 15 MG Remeron mad ehim feel to lethargic and "funny, like a zombie".  Discussed with him trial of 7.5 MG at night.  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  States insomnia started over past two years.   Duration:  years Satisfied with sleep quality: sometimes Difficulty falling asleep: no Difficulty staying asleep: yes Waking a few hours after sleep onset: yes Early morning awakenings: yes Daytime hypersomnolence: no Wakes feeling refreshed: no Good sleep hygiene: yes Apnea: no Snoring: no Depressed/anxious mood: no Recent stress: wife is in rehab after fall Restless legs/nocturnal leg cramps: no Chronic pain/arthritis: no History of sleep study: no Treatments attempted: and Remeron and melatonin   Relevant past medical, surgical, family and social history reviewed and updated as indicated. Interim medical history since our last visit reviewed. Allergies and medications reviewed and updated.  Review of Systems  Constitutional: Positive for fatigue. Negative for activity change, diaphoresis and fever.  HENT: Positive for congestion, postnasal drip, rhinorrhea, sneezing and sore throat. Negative for ear discharge, ear pain, sinus pressure, sinus pain, tinnitus and voice change.   Respiratory: Positive for cough and shortness of breath. Negative for chest tightness and wheezing.   Cardiovascular: Negative for chest pain, palpitations and leg swelling.  Gastrointestinal: Negative for abdominal distention, abdominal pain, constipation, diarrhea, nausea and vomiting.  Endocrine: Negative for cold intolerance, heat intolerance, polydipsia, polyphagia and polyuria.  Musculoskeletal: Negative for myalgias.  Skin: Negative.   Neurological: Negative for dizziness, syncope, weakness, light-headedness, numbness and headaches.  Psychiatric/Behavioral: Negative.     Per HPI unless specifically indicated above     Objective:    BP 118/62 (BP Location: Left  Arm, Patient Position: Sitting, Cuff Size: Normal)   Pulse (!) 57   Temp 97.8 F (36.6 C) (Oral)   Ht 5\' 5"  (1.651 m)   Wt 154 lb 4 oz (70 kg)   SpO2 99%   BMI 25.67 kg/m   Wt Readings from Last 3 Encounters:  04/09/18 154 lb 4 oz (70 kg)    03/14/18 152 lb 2 oz (69 kg)  02/14/18 154 lb (69.9 kg)    Physical Exam Vitals signs and nursing note reviewed.  Constitutional:      General: He is awake.     Appearance: He is well-developed.  HENT:     Head: Normocephalic and atraumatic. No raccoon eyes.     Right Ear: Hearing, ear canal and external ear normal. No drainage. A middle ear effusion is present.     Left Ear: Hearing, ear canal and external ear normal. No drainage. A middle ear effusion is present.     Nose: Mucosal edema and rhinorrhea present. Rhinorrhea is clear.     Right Sinus: No maxillary sinus tenderness or frontal sinus tenderness.     Left Sinus: No maxillary sinus tenderness or frontal sinus tenderness.     Mouth/Throat:     Pharynx: Uvula midline. Posterior oropharyngeal erythema (mild with cobblestoning) present. No pharyngeal swelling or oropharyngeal exudate.  Eyes:     General: Lids are normal.        Right eye: No discharge.        Left eye: No discharge.     Conjunctiva/sclera: Conjunctivae normal.     Pupils: Pupils are equal, round, and reactive to light.  Neck:     Musculoskeletal: Normal range of motion and neck supple.     Thyroid: No thyromegaly.     Vascular: No carotid bruit or JVD.     Trachea: Trachea normal.  Cardiovascular:     Rate and Rhythm: Normal rate and regular rhythm.     Heart sounds: S1 normal and S2 normal. Murmur present. Systolic murmur present with a grade of 1/6. No gallop.   Pulmonary:     Effort: Pulmonary effort is normal.     Breath sounds: Normal breath sounds.     Comments: Clear throughout, including RML. Abdominal:     General: Bowel sounds are normal.     Palpations: Abdomen is soft. There is no hepatomegaly or splenomegaly.  Musculoskeletal: Normal range of motion.     Right lower leg: No edema.     Left lower leg: No edema.  Lymphadenopathy:     Head:     Right side of head: No submental, submandibular, tonsillar or preauricular adenopathy.     Left  side of head: No submental, submandibular, tonsillar or preauricular adenopathy.     Cervical: No cervical adenopathy.  Skin:    General: Skin is warm and dry.     Capillary Refill: Capillary refill takes less than 2 seconds.     Findings: No rash.  Neurological:     Mental Status: He is alert and oriented to person, place, and time.     Deep Tendon Reflexes: Reflexes are normal and symmetric.  Psychiatric:        Attention and Perception: Attention normal.        Mood and Affect: Mood normal.        Behavior: Behavior normal. Behavior is cooperative.        Thought Content: Thought content normal.        Judgment: Judgment  normal.     Results for orders placed or performed in visit on 03/14/18  Comprehensive metabolic panel  Result Value Ref Range   Glucose 60 (L) 65 - 99 mg/dL   BUN 39 (H) 8 - 27 mg/dL   Creatinine, Ser 2.39 (H) 0.76 - 1.27 mg/dL   GFR calc non Af Amer 24 (L) >59 mL/min/1.73   GFR calc Af Amer 28 (L) >59 mL/min/1.73   BUN/Creatinine Ratio 16 10 - 24   Sodium 137 134 - 144 mmol/L   Potassium 5.3 (H) 3.5 - 5.2 mmol/L   Chloride 101 96 - 106 mmol/L   CO2 21 20 - 29 mmol/L   Calcium 9.1 8.6 - 10.2 mg/dL   Total Protein 8.2 6.0 - 8.5 g/dL   Albumin 3.8 3.6 - 4.6 g/dL   Globulin, Total 4.4 1.5 - 4.5 g/dL   Albumin/Globulin Ratio 0.9 (L) 1.2 - 2.2   Bilirubin Total 0.4 0.0 - 1.2 mg/dL   Alkaline Phosphatase 82 39 - 117 IU/L   AST 30 0 - 40 IU/L   ALT 19 0 - 44 IU/L  CBC with Differential/Platelet  Result Value Ref Range   WBC 8.1 3.4 - 10.8 x10E3/uL   RBC 3.33 (L) 4.14 - 5.80 x10E6/uL   Hemoglobin 10.0 (L) 13.0 - 17.7 g/dL   Hematocrit 31.0 (L) 37.5 - 51.0 %   MCV 93 79 - 97 fL   MCH 30.0 26.6 - 33.0 pg   MCHC 32.3 31.5 - 35.7 g/dL   RDW 12.3 11.6 - 15.4 %   Platelets 259 150 - 450 x10E3/uL   Neutrophils 53 Not Estab. %   Lymphs 32 Not Estab. %   Monocytes 11 Not Estab. %   Eos 3 Not Estab. %   Basos 1 Not Estab. %   Neutrophils Absolute 4.2 1.4 -  7.0 x10E3/uL   Lymphocytes Absolute 2.6 0.7 - 3.1 x10E3/uL   Monocytes Absolute 0.9 0.1 - 0.9 x10E3/uL   EOS (ABSOLUTE) 0.3 0.0 - 0.4 x10E3/uL   Basophils Absolute 0.1 0.0 - 0.2 x10E3/uL   Immature Granulocytes 0 Not Estab. %   Immature Grans (Abs) 0.0 0.0 - 0.1 x10E3/uL  Lipid panel  Result Value Ref Range   Cholesterol, Total 111 100 - 199 mg/dL   Triglycerides 52 0 - 149 mg/dL   HDL 54 >39 mg/dL   VLDL Cholesterol Cal 10 5 - 40 mg/dL   LDL Calculated 47 0 - 99 mg/dL   Chol/HDL Ratio 2.1 0.0 - 5.0 ratio  TSH  Result Value Ref Range   TSH 3.510 0.450 - 4.500 uIU/mL  Bayer DCA Hb A1c Waived  Result Value Ref Range   HB A1C (BAYER DCA - WAIVED) 5.8 <7.0 %      Assessment & Plan:   Problem List Items Addressed This Visit      Other   Insomnia    Recommend cutting Remeron dose to 7.5MG  based on age and increased lethargy with 15 MG dosing.  If continued side effects with 7.5 MG discontinue medication and use Melatonin as needed for sleep.  Recommended sleep hygiene techniques.        Cough in adult - Primary    Acute present x 2 weeks.  Flu testing negative.  Due to advanced age and risk visiting wife in [place] facility will obtain CXR and initiate Doxycycline 100 MG BID x 7 days.  Recommend continued use of Coricidin at home for symptoms management and saline nasal spray for  congestion.  Tylenol as needed for chills.  Avoid Ibuprofen.  Ensure plenty of rest and fluid intake at home.  Return if ongoing or worsening symptoms.      Relevant Orders   Veritor Flu A/B Waived       Follow up plan: Return if symptoms worsen or fail to improve.

## 2018-05-27 ENCOUNTER — Telehealth: Payer: Self-pay | Admitting: Family Medicine

## 2018-05-27 ENCOUNTER — Encounter: Payer: Self-pay | Admitting: Family Medicine

## 2018-05-27 NOTE — Telephone Encounter (Signed)
Order written. [facility] have [facility]

## 2018-05-27 NOTE — Telephone Encounter (Signed)
Please order

## 2018-05-27 NOTE — Telephone Encounter (Signed)
Copied from [place] [id number]. Topic: Quick Communication - See Telephone Encounter >> May 27, 2018  2:09 PM Blase Mess A wrote: CRM for notification. See Telephone encounter for: 05/27/18.  Patient is calling because he is requesting a new prescribtion for Diabetic meter and testing supplying. Please advise.

## 2018-05-29 DIAGNOSIS — I10 Essential (primary) hypertension: Secondary | ICD-10-CM | POA: Diagnosis not present

## 2018-05-29 DIAGNOSIS — E1122 Type 2 diabetes mellitus with diabetic chronic kidney disease: Secondary | ICD-10-CM | POA: Diagnosis not present

## 2018-05-29 DIAGNOSIS — R600 Localized edema: Secondary | ICD-10-CM | POA: Diagnosis not present

## 2018-05-29 DIAGNOSIS — E875 Hyperkalemia: Secondary | ICD-10-CM | POA: Diagnosis not present

## 2018-05-29 DIAGNOSIS — N184 Chronic kidney disease, stage 4 (severe): Secondary | ICD-10-CM | POA: Diagnosis not present

## 2018-06-10 DIAGNOSIS — E1122 Type 2 diabetes mellitus with diabetic chronic kidney disease: Secondary | ICD-10-CM | POA: Diagnosis not present

## 2018-06-10 DIAGNOSIS — R600 Localized edema: Secondary | ICD-10-CM | POA: Diagnosis not present

## 2018-06-10 DIAGNOSIS — E875 Hyperkalemia: Secondary | ICD-10-CM | POA: Diagnosis not present

## 2018-06-10 DIAGNOSIS — N184 Chronic kidney disease, stage 4 (severe): Secondary | ICD-10-CM | POA: Diagnosis not present

## 2018-06-11 ENCOUNTER — Telehealth: Payer: Self-pay

## 2018-06-11 NOTE — Telephone Encounter (Signed)
Called patient, left a message to see if patient requested a prescription for his testing supplies through [facility] in [place], waiting for a call back.   Paperwork in Radio producer.

## 2018-06-18 DIAGNOSIS — R609 Edema, unspecified: Secondary | ICD-10-CM | POA: Diagnosis not present

## 2018-06-18 DIAGNOSIS — I2510 Atherosclerotic heart disease of native coronary artery without angina pectoris: Secondary | ICD-10-CM | POA: Diagnosis not present

## 2018-06-18 DIAGNOSIS — I10 Essential (primary) hypertension: Secondary | ICD-10-CM | POA: Diagnosis not present

## 2018-06-18 DIAGNOSIS — I340 Nonrheumatic mitral (valve) insufficiency: Secondary | ICD-10-CM | POA: Diagnosis not present

## 2018-06-18 DIAGNOSIS — K219 Gastro-esophageal reflux disease without esophagitis: Secondary | ICD-10-CM | POA: Diagnosis not present

## 2018-06-18 DIAGNOSIS — I25810 Atherosclerosis of coronary artery bypass graft(s) without angina pectoris: Secondary | ICD-10-CM | POA: Diagnosis not present

## 2018-06-18 DIAGNOSIS — I129 Hypertensive chronic kidney disease with stage 1 through stage 4 chronic kidney disease, or unspecified chronic kidney disease: Secondary | ICD-10-CM | POA: Diagnosis not present

## 2018-06-18 DIAGNOSIS — E785 Hyperlipidemia, unspecified: Secondary | ICD-10-CM | POA: Diagnosis not present

## 2018-06-18 DIAGNOSIS — R0602 Shortness of breath: Secondary | ICD-10-CM | POA: Diagnosis not present

## 2018-06-18 DIAGNOSIS — I6389 Other cerebral infarction: Secondary | ICD-10-CM | POA: Diagnosis not present

## 2018-06-20 ENCOUNTER — Ambulatory Visit: Payer: Self-pay | Admitting: *Deleted

## 2018-06-20 NOTE — Chronic Care Management (AMB) (Signed)
°  Chronic Care Management   Outreach Note  06/20/2018 Name: SHNEUR WHITTENBURG MRN: [id number] DOB: 06/06/1930  Referred by: [name], MD Reason for referral : Chronic Care Management (First CCM outreach was unsuccessful. )   An unsuccessful telephone outreach was attempted today. The patient was referred to the case management team by for assistance with chronic care management and care coordination.   Follow Up Plan: The CM team will reach out to the patient again over the next 7 days.   [place]  [email or web address]   ??[phone]

## 2018-06-20 NOTE — Telephone Encounter (Signed)
Patient called back.  He states he has worked this out with Dr Jeananne Rama and [facility].  Thank you

## 2018-06-24 DIAGNOSIS — R079 Chest pain, unspecified: Secondary | ICD-10-CM | POA: Diagnosis not present

## 2018-06-27 ENCOUNTER — Other Ambulatory Visit: Payer: Self-pay | Admitting: Cardiovascular Disease

## 2018-06-27 DIAGNOSIS — E785 Hyperlipidemia, unspecified: Secondary | ICD-10-CM | POA: Diagnosis not present

## 2018-06-27 DIAGNOSIS — I25810 Atherosclerosis of coronary artery bypass graft(s) without angina pectoris: Secondary | ICD-10-CM | POA: Diagnosis not present

## 2018-06-27 DIAGNOSIS — K219 Gastro-esophageal reflux disease without esophagitis: Secondary | ICD-10-CM | POA: Diagnosis not present

## 2018-06-27 DIAGNOSIS — R609 Edema, unspecified: Secondary | ICD-10-CM | POA: Diagnosis not present

## 2018-06-27 DIAGNOSIS — R0602 Shortness of breath: Secondary | ICD-10-CM | POA: Diagnosis not present

## 2018-06-27 DIAGNOSIS — I25709 Atherosclerosis of coronary artery bypass graft(s), unspecified, with unspecified angina pectoris: Secondary | ICD-10-CM | POA: Insufficient documentation

## 2018-06-27 DIAGNOSIS — I6389 Other cerebral infarction: Secondary | ICD-10-CM | POA: Diagnosis not present

## 2018-06-27 DIAGNOSIS — I2510 Atherosclerotic heart disease of native coronary artery without angina pectoris: Secondary | ICD-10-CM | POA: Diagnosis not present

## 2018-06-27 DIAGNOSIS — I10 Essential (primary) hypertension: Secondary | ICD-10-CM | POA: Diagnosis not present

## 2018-06-27 DIAGNOSIS — I129 Hypertensive chronic kidney disease with stage 1 through stage 4 chronic kidney disease, or unspecified chronic kidney disease: Secondary | ICD-10-CM | POA: Diagnosis not present

## 2018-06-27 DIAGNOSIS — I340 Nonrheumatic mitral (valve) insufficiency: Secondary | ICD-10-CM | POA: Diagnosis not present

## 2018-06-27 MED ORDER — SODIUM CHLORIDE 0.9% FLUSH
3.0000 mL | Freq: Two times a day (BID) | INTRAVENOUS | Status: AC
  Filled 2018-06-27: qty 3

## 2018-06-28 ENCOUNTER — Other Ambulatory Visit
Admission: RE | Admit: 2018-06-28 | Discharge: 2018-06-28 | Disposition: A | Discharge status: Home / Self Care | Payer: Medicare Other | Source: Ambulatory Visit | Attending: Cardiovascular Disease | Admitting: Cardiovascular Disease

## 2018-06-28 ENCOUNTER — Other Ambulatory Visit: Payer: Self-pay

## 2018-06-28 DIAGNOSIS — Z1159 Encounter for screening for other viral diseases: Secondary | ICD-10-CM | POA: Diagnosis not present

## 2018-06-29 LAB — NOVEL CORONAVIRUS, NAA (HOSP ORDER, SEND-OUT TO REF LAB; TAT 18-24 HRS): SARS-CoV-2, NAA: NOT DETECTED

## 2018-07-01 ENCOUNTER — Encounter
Admission: RE | Disposition: A | Discharge status: Home / Self Care | Payer: Self-pay | Source: Home / Self Care | Attending: Cardiovascular Disease

## 2018-07-01 ENCOUNTER — Other Ambulatory Visit: Payer: Self-pay

## 2018-07-01 ENCOUNTER — Ambulatory Visit
Admission: RE | Admit: 2018-07-01 | Discharge: 2018-07-01 | Disposition: A | Discharge status: Home / Self Care | Payer: Medicare Other | Attending: Cardiovascular Disease | Admitting: Cardiovascular Disease

## 2018-07-01 ENCOUNTER — Encounter: Payer: Self-pay | Admitting: Cardiovascular Disease

## 2018-07-01 DIAGNOSIS — Z87891 Personal history of nicotine dependence: Secondary | ICD-10-CM | POA: Insufficient documentation

## 2018-07-01 DIAGNOSIS — Z91041 Radiographic dye allergy status: Secondary | ICD-10-CM | POA: Diagnosis not present

## 2018-07-01 DIAGNOSIS — Z951 Presence of aortocoronary bypass graft: Secondary | ICD-10-CM | POA: Diagnosis not present

## 2018-07-01 DIAGNOSIS — E1122 Type 2 diabetes mellitus with diabetic chronic kidney disease: Secondary | ICD-10-CM | POA: Insufficient documentation

## 2018-07-01 DIAGNOSIS — I25709 Atherosclerosis of coronary artery bypass graft(s), unspecified, with unspecified angina pectoris: Secondary | ICD-10-CM | POA: Diagnosis present

## 2018-07-01 DIAGNOSIS — E785 Hyperlipidemia, unspecified: Secondary | ICD-10-CM | POA: Diagnosis not present

## 2018-07-01 DIAGNOSIS — Z833 Family history of diabetes mellitus: Secondary | ICD-10-CM | POA: Diagnosis not present

## 2018-07-01 DIAGNOSIS — Z79899 Other long term (current) drug therapy: Secondary | ICD-10-CM | POA: Diagnosis not present

## 2018-07-01 DIAGNOSIS — I2510 Atherosclerotic heart disease of native coronary artery without angina pectoris: Secondary | ICD-10-CM | POA: Insufficient documentation

## 2018-07-01 DIAGNOSIS — Z7984 Long term (current) use of oral hypoglycemic drugs: Secondary | ICD-10-CM | POA: Diagnosis not present

## 2018-07-01 DIAGNOSIS — I129 Hypertensive chronic kidney disease with stage 1 through stage 4 chronic kidney disease, or unspecified chronic kidney disease: Secondary | ICD-10-CM | POA: Insufficient documentation

## 2018-07-01 DIAGNOSIS — N184 Chronic kidney disease, stage 4 (severe): Secondary | ICD-10-CM | POA: Diagnosis not present

## 2018-07-01 DIAGNOSIS — Z8249 Family history of ischemic heart disease and other diseases of the circulatory system: Secondary | ICD-10-CM | POA: Insufficient documentation

## 2018-07-01 DIAGNOSIS — Z8673 Personal history of transient ischemic attack (TIA), and cerebral infarction without residual deficits: Secondary | ICD-10-CM | POA: Insufficient documentation

## 2018-07-01 DIAGNOSIS — R0602 Shortness of breath: Secondary | ICD-10-CM | POA: Diagnosis not present

## 2018-07-01 DIAGNOSIS — R600 Localized edema: Secondary | ICD-10-CM | POA: Insufficient documentation

## 2018-07-01 DIAGNOSIS — I340 Nonrheumatic mitral (valve) insufficiency: Secondary | ICD-10-CM | POA: Insufficient documentation

## 2018-07-01 DIAGNOSIS — I200 Unstable angina: Secondary | ICD-10-CM | POA: Diagnosis not present

## 2018-07-01 DIAGNOSIS — I25810 Atherosclerosis of coronary artery bypass graft(s) without angina pectoris: Secondary | ICD-10-CM | POA: Diagnosis not present

## 2018-07-01 DIAGNOSIS — Z7982 Long term (current) use of aspirin: Secondary | ICD-10-CM | POA: Insufficient documentation

## 2018-07-01 HISTORY — PX: LEFT HEART CATH AND CORONARY ANGIOGRAPHY: CATH118249

## 2018-07-01 LAB — GLUCOSE, CAPILLARY
Glucose-Capillary: 114 mg/dL — ABNORMAL HIGH (ref 70–99)
Glucose-Capillary: 144 mg/dL — ABNORMAL HIGH (ref 70–99)

## 2018-07-01 LAB — BASIC METABOLIC PANEL
Anion gap: 9 (ref 5–15)
BUN: 82 mg/dL — ABNORMAL HIGH (ref 8–23)
CO2: 26 mmol/L (ref 22–32)
Calcium: 8.8 mg/dL — ABNORMAL LOW (ref 8.9–10.3)
Chloride: 99 mmol/L (ref 98–111)
Creatinine, Ser: 3.15 mg/dL — ABNORMAL HIGH (ref 0.61–1.24)
GFR calc Af Amer: 20 mL/min — ABNORMAL LOW (ref >60)
GFR calc non Af Amer: 17 mL/min — ABNORMAL LOW (ref >60)
Glucose, Bld: 115 mg/dL — ABNORMAL HIGH (ref 70–99)
Potassium: 4.1 mmol/L (ref 3.5–5.1)
Sodium: 134 mmol/L — ABNORMAL LOW (ref 135–145)

## 2018-07-01 LAB — HEMOGLOBIN AND HEMATOCRIT, BLOOD
HCT: 30.1 % — ABNORMAL LOW (ref 39.0–52.0)
Hemoglobin: 9.5 g/dL — ABNORMAL LOW (ref 13.0–17.0)

## 2018-07-01 SURGERY — LEFT HEART CATH AND CORONARY ANGIOGRAPHY
Anesthesia: Moderate Sedation | Laterality: Left

## 2018-07-01 MED ORDER — FAMOTIDINE 20 MG PO TABS
ORAL_TABLET | ORAL | Status: AC
  Administered 2018-07-01: 10:00:00 40 mg
  Filled 2018-07-01: qty 2

## 2018-07-01 MED ORDER — MIDAZOLAM HCL 2 MG/2ML IJ SOLN
INTRAMUSCULAR | Status: DC | PRN
  Administered 2018-07-01: 0.5 mg via INTRAVENOUS

## 2018-07-01 MED ORDER — HYDRALAZINE HCL 20 MG/ML IJ SOLN: 10.0000 mg | INTRAMUSCULAR | Status: DC | PRN

## 2018-07-01 MED ORDER — SODIUM CHLORIDE 0.9 % IV SOLN: 250.0000 mL | INTRAVENOUS | Status: DC | PRN

## 2018-07-01 MED ORDER — FENTANYL CITRATE (PF) 100 MCG/2ML IJ SOLN
INTRAMUSCULAR | Status: AC
  Filled 2018-07-01: qty 2

## 2018-07-01 MED ORDER — HEPARIN (PORCINE) IN NACL 1000-0.9 UT/500ML-% IV SOLN
INTRAVENOUS | Status: AC
  Filled 2018-07-01: qty 1000

## 2018-07-01 MED ORDER — METHYLPREDNISOLONE SODIUM SUCC 125 MG IJ SOLR
INTRAMUSCULAR | Status: AC
  Administered 2018-07-01: 125 mg
  Filled 2018-07-01: qty 2

## 2018-07-01 MED ORDER — HEPARIN (PORCINE) IN NACL 1000-0.9 UT/500ML-% IV SOLN
INTRAVENOUS | Status: DC | PRN
  Administered 2018-07-01: 500 mL

## 2018-07-01 MED ORDER — SODIUM CHLORIDE 0.9% FLUSH: 3.0000 mL | Freq: Two times a day (BID) | INTRAVENOUS | Status: DC

## 2018-07-01 MED ORDER — SODIUM CHLORIDE 0.9 % WEIGHT BASED INFUSION: 1.0000 mL/kg/h | INTRAVENOUS | Status: DC

## 2018-07-01 MED ORDER — ONDANSETRON HCL 4 MG/2ML IJ SOLN: 4.0000 mg | Freq: Four times a day (QID) | INTRAMUSCULAR | Status: DC | PRN

## 2018-07-01 MED ORDER — SODIUM CHLORIDE 0.9 % IV SOLN
INTRAVENOUS | Status: AC | PRN
  Administered 2018-07-01: 500 mL/h via INTRAVENOUS

## 2018-07-01 MED ORDER — ACETAMINOPHEN 325 MG PO TABS: 650.0000 mg | ORAL_TABLET | ORAL | Status: DC | PRN

## 2018-07-01 MED ORDER — ASPIRIN 81 MG PO CHEW
CHEWABLE_TABLET | ORAL | Status: AC
  Administered 2018-07-01: 81 mg via ORAL
  Filled 2018-07-01: qty 1

## 2018-07-01 MED ORDER — SODIUM CHLORIDE 0.9% FLUSH: 3.0000 mL | INTRAVENOUS | Status: DC | PRN

## 2018-07-01 MED ORDER — SODIUM BICARBONATE BOLUS VIA INFUSION
INTRAVENOUS | Status: AC
  Administered 2018-07-01: 205 meq via INTRAVENOUS
  Filled 2018-07-01: qty 1

## 2018-07-01 MED ORDER — LABETALOL HCL 5 MG/ML IV SOLN: 10.0000 mg | INTRAVENOUS | Status: DC | PRN

## 2018-07-01 MED ORDER — DIPHENHYDRAMINE HCL 50 MG/ML IJ SOLN
INTRAMUSCULAR | Status: AC
  Administered 2018-07-01: 50 mg
  Filled 2018-07-01: qty 1

## 2018-07-01 MED ORDER — SODIUM BICARBONATE 8.4 % IV SOLN
INTRAVENOUS | Status: DC
  Filled 2018-07-01: qty 1000

## 2018-07-01 MED ORDER — MIDAZOLAM HCL 2 MG/2ML IJ SOLN
INTRAMUSCULAR | Status: AC
  Filled 2018-07-01: qty 2

## 2018-07-01 MED ORDER — ASPIRIN 81 MG PO CHEW
81.0000 mg | CHEWABLE_TABLET | ORAL | Status: AC
  Administered 2018-07-01: 81 mg via ORAL

## 2018-07-01 MED ORDER — SODIUM CHLORIDE 0.9 % IV SOLN
INTRAVENOUS | Status: DC
  Administered 2018-07-01: 10:00:00 via INTRAVENOUS

## 2018-07-01 MED ORDER — FENTANYL CITRATE (PF) 100 MCG/2ML IJ SOLN
INTRAMUSCULAR | Status: DC | PRN
  Administered 2018-07-01: 25 ug via INTRAVENOUS

## 2018-07-01 MED ORDER — IOHEXOL 300 MG/ML  SOLN
INTRAMUSCULAR | Status: DC | PRN
  Administered 2018-07-01: 75 mL via INTRA_ARTERIAL

## 2018-07-01 SURGICAL SUPPLY — 13 items
CATH ANGIO 5F JB2 100CM (CATHETERS) ×2 IMPLANT
CATH INFINITI 5 FR IM (CATHETERS) ×2 IMPLANT
CATH INFINITI 5 FR LCB (CATHETERS) ×2 IMPLANT
CATH INFINITI 5 FR MPA2 (CATHETERS) ×2 IMPLANT
CATH INFINITI 5FR JL4 (CATHETERS) ×2 IMPLANT
CATH INFINITI JR4 5F (CATHETERS) ×2 IMPLANT
DEVICE CLOSURE MYNXGRIP 5F (Vascular Products) ×2 IMPLANT
KIT MANI 3VAL PERCEP (MISCELLANEOUS) ×2 IMPLANT
NEEDLE PERC 18GX7CM (NEEDLE) ×2 IMPLANT
PACK CARDIAC CATH (CUSTOM PROCEDURE TRAY) ×2 IMPLANT
SHEATH AVANTI 5FR X 11CM (SHEATH) ×2 IMPLANT
WIRE EMERALD 3MM-J .035X260CM (WIRE) ×2 IMPLANT
WIRE GUIDERIGHT .035X150 (WIRE) ×2 IMPLANT

## 2018-07-01 NOTE — Discharge Instructions (Signed)
Angiogram, Care After °This sheet gives you information about how to care for yourself after your procedure. Your doctor may also give you more specific instructions. If you have problems or questions, contact your doctor. °Follow these instructions at home: °Insertion site care °· Follow instructions from your doctor about how to take care of your long, thin tube (catheter) insertion area. Make sure you: °? Wash your hands with soap and water before you change your bandage (dressing). If you cannot use soap and water, use hand sanitizer. °? Change your bandage as told by your doctor. °? Leave stitches (sutures), skin glue, or skin tape (adhesive) strips in place. They may need to stay in place for 2 weeks or longer. If tape strips get loose and curl up, you may trim the loose edges. Do not remove tape strips completely unless your doctor says it is okay. °· Do not take baths, swim, or use a hot tub until your doctor says it is okay. °· You may shower 24-48 hours after the procedure or as told by your doctor. °? Gently wash the area with plain soap and water. °? Pat the area dry with a clean towel. °? Do not rub the area. This may cause bleeding. °· Do not apply powder or lotion to the area. Keep the area clean and dry. °· Check your insertion area every day for signs of infection. Check for: °? More redness, swelling, or pain. °? Fluid or blood. °? Warmth. °? Pus or a bad smell. °Activity °· Rest as told by your doctor, usually for 1-2 days. °· Do not lift anything that is heavier than 10 lbs. (4.5 kg) or as told by your doctor. °· Do not drive for 24 hours if you were given a medicine to help you relax (sedative). °· Do not drive or use heavy machinery while taking prescription pain medicine. °General instructions ° °· Go back to your normal activities as told by your doctor, usually in about a week. Ask your doctor what activities are safe for you. °· If the insertion area starts to bleed, lie flat and put  pressure on the area. If the bleeding does not stop, get help right away. This is an emergency. °· Drink enough fluid to keep your pee (urine) clear or pale yellow. °· Take over-the-counter and prescription medicines only as told by your doctor. °· Keep all follow-up visits as told by your doctor. This is important. °Contact a doctor if: °· You have a fever. °· You have chills. °· You have more redness, swelling, or pain around your insertion area. °· You have fluid or blood coming from your insertion area. °· The insertion area feels warm to the touch. °· You have pus or a bad smell coming from your insertion area. °· You have more bruising around the insertion area. °· Blood collects in the tissue around the insertion area (hematoma) that may be painful to the touch. °Get help right away if: °· You have a lot of pain in the insertion area. °· The insertion area swells very fast. °· The insertion area is bleeding, and the bleeding does not stop after holding steady pressure on the area. °· The area near or just beyond the insertion area becomes pale, cool, tingly, or numb. °These symptoms may be an emergency. Do not wait to see if the symptoms will go away. Get medical help right away. Call your local emergency services (911 in the [place] Do not drive yourself to the hospital. °  Summary  After the procedure, it is common to have bruising and tenderness at the long, thin tube insertion area.  After the procedure, it is important to rest and drink plenty of fluids.  Do not take baths, swim, or use a hot tub until your doctor says it is okay to do so. You may shower 24-48 hours after the procedure or as told by your doctor.  If the insertion area starts to bleed, lie flat and put pressure on the area. If the bleeding does not stop, get help right away. This is an emergency. This information is not intended to replace advice given to you by your health care provider. Make sure you discuss any questions you have  with your health care provider. Document Released: 04/28/2008 Document Revised: 01/25/2016 Document Reviewed: 01/25/2016 Elsevier Interactive Patient Education  2019 [employer].     Moderate Conscious Sedation, Adult, Care After These instructions provide you with information about caring for yourself after your procedure. Your health care provider may also give you more specific instructions. Your treatment has been planned according to current medical practices, but problems sometimes occur. Call your health care provider if you have any problems or questions after your procedure. What can I expect after the procedure? After your procedure, it is common:  To feel sleepy for several hours.  To feel clumsy and have poor balance for several hours.  To have poor judgment for several hours.  To vomit if you eat too soon. Follow these instructions at home: For at least 24 hours after the procedure:   Do not: ? Participate in activities where you could fall or become injured. ? Drive. ? Use heavy machinery. ? Drink alcohol. ? Take sleeping pills or medicines that cause drowsiness. ? Make important decisions or sign legal documents. ? Take care of children on your own.  Rest. Eating and drinking  Follow the diet recommended by your health care provider.  If you vomit: ? Drink water, juice, or soup when you can drink without vomiting. ? Make sure you have little or no nausea before eating solid foods. General instructions  Have a responsible adult stay with you until you are awake and alert.  Take over-the-counter and prescription medicines only as told by your health care provider.  If you smoke, do not smoke without supervision.  Keep all follow-up visits as told by your health care provider. This is important. Contact a health care provider if:  You keep feeling nauseous or you keep vomiting.  You feel light-headed.  You develop a rash.  You have a fever. Get  help right away if:  You have trouble breathing. This information is not intended to replace advice given to you by your health care provider. Make sure you discuss any questions you have with your health care provider. Document Released: 11/20/2012 Document Revised: 07/05/2015 Document Reviewed: 05/22/2015 Elsevier Interactive Patient Education  2019 [employer].

## 2018-07-03 DIAGNOSIS — N390 Urinary tract infection, site not specified: Secondary | ICD-10-CM | POA: Diagnosis not present

## 2018-07-03 DIAGNOSIS — N184 Chronic kidney disease, stage 4 (severe): Secondary | ICD-10-CM | POA: Diagnosis not present

## 2018-07-03 DIAGNOSIS — R600 Localized edema: Secondary | ICD-10-CM | POA: Diagnosis not present

## 2018-07-03 DIAGNOSIS — E1122 Type 2 diabetes mellitus with diabetic chronic kidney disease: Secondary | ICD-10-CM | POA: Diagnosis not present

## 2018-07-03 DIAGNOSIS — E875 Hyperkalemia: Secondary | ICD-10-CM | POA: Diagnosis not present

## 2018-07-03 DIAGNOSIS — I10 Essential (primary) hypertension: Secondary | ICD-10-CM | POA: Diagnosis not present

## 2018-07-05 DIAGNOSIS — I2510 Atherosclerotic heart disease of native coronary artery without angina pectoris: Secondary | ICD-10-CM | POA: Diagnosis not present

## 2018-07-05 DIAGNOSIS — N184 Chronic kidney disease, stage 4 (severe): Secondary | ICD-10-CM | POA: Diagnosis not present

## 2018-07-05 DIAGNOSIS — E785 Hyperlipidemia, unspecified: Secondary | ICD-10-CM | POA: Diagnosis not present

## 2018-07-05 DIAGNOSIS — I10 Essential (primary) hypertension: Secondary | ICD-10-CM | POA: Diagnosis not present

## 2018-07-05 DIAGNOSIS — I340 Nonrheumatic mitral (valve) insufficiency: Secondary | ICD-10-CM | POA: Diagnosis not present

## 2018-07-09 DIAGNOSIS — R600 Localized edema: Secondary | ICD-10-CM | POA: Diagnosis not present

## 2018-07-09 DIAGNOSIS — E1129 Type 2 diabetes mellitus with other diabetic kidney complication: Secondary | ICD-10-CM | POA: Diagnosis not present

## 2018-07-09 DIAGNOSIS — N184 Chronic kidney disease, stage 4 (severe): Secondary | ICD-10-CM | POA: Diagnosis not present

## 2018-07-09 DIAGNOSIS — I129 Hypertensive chronic kidney disease with stage 1 through stage 4 chronic kidney disease, or unspecified chronic kidney disease: Secondary | ICD-10-CM | POA: Diagnosis not present

## 2018-07-15 DIAGNOSIS — I340 Nonrheumatic mitral (valve) insufficiency: Secondary | ICD-10-CM | POA: Diagnosis not present

## 2018-07-15 DIAGNOSIS — R0602 Shortness of breath: Secondary | ICD-10-CM | POA: Diagnosis not present

## 2018-07-15 DIAGNOSIS — I10 Essential (primary) hypertension: Secondary | ICD-10-CM | POA: Diagnosis not present

## 2018-07-15 DIAGNOSIS — K219 Gastro-esophageal reflux disease without esophagitis: Secondary | ICD-10-CM | POA: Diagnosis not present

## 2018-07-15 DIAGNOSIS — I25810 Atherosclerosis of coronary artery bypass graft(s) without angina pectoris: Secondary | ICD-10-CM | POA: Diagnosis not present

## 2018-07-15 DIAGNOSIS — I2510 Atherosclerotic heart disease of native coronary artery without angina pectoris: Secondary | ICD-10-CM | POA: Diagnosis not present

## 2018-07-15 DIAGNOSIS — E785 Hyperlipidemia, unspecified: Secondary | ICD-10-CM | POA: Diagnosis not present

## 2018-07-25 DIAGNOSIS — R600 Localized edema: Secondary | ICD-10-CM | POA: Diagnosis not present

## 2018-07-25 DIAGNOSIS — I10 Essential (primary) hypertension: Secondary | ICD-10-CM | POA: Diagnosis not present

## 2018-07-25 DIAGNOSIS — N184 Chronic kidney disease, stage 4 (severe): Secondary | ICD-10-CM | POA: Diagnosis not present

## 2018-07-25 DIAGNOSIS — E875 Hyperkalemia: Secondary | ICD-10-CM | POA: Diagnosis not present

## 2018-07-25 DIAGNOSIS — E1122 Type 2 diabetes mellitus with diabetic chronic kidney disease: Secondary | ICD-10-CM | POA: Diagnosis not present

## 2018-07-30 ENCOUNTER — Other Ambulatory Visit: Payer: Self-pay | Admitting: Family Medicine

## 2018-07-30 NOTE — Telephone Encounter (Signed)
Last filled by Demetrios Loll, MD please advise

## 2018-07-31 DIAGNOSIS — Z08 Encounter for follow-up examination after completed treatment for malignant neoplasm: Secondary | ICD-10-CM | POA: Diagnosis not present

## 2018-07-31 DIAGNOSIS — L570 Actinic keratosis: Secondary | ICD-10-CM | POA: Diagnosis not present

## 2018-07-31 DIAGNOSIS — D485 Neoplasm of uncertain behavior of skin: Secondary | ICD-10-CM | POA: Diagnosis not present

## 2018-07-31 DIAGNOSIS — D225 Melanocytic nevi of trunk: Secondary | ICD-10-CM | POA: Diagnosis not present

## 2018-07-31 DIAGNOSIS — X32XXXA Exposure to sunlight, initial encounter: Secondary | ICD-10-CM | POA: Diagnosis not present

## 2018-07-31 DIAGNOSIS — C44329 Squamous cell carcinoma of skin of other parts of face: Secondary | ICD-10-CM | POA: Diagnosis not present

## 2018-07-31 DIAGNOSIS — L821 Other seborrheic keratosis: Secondary | ICD-10-CM | POA: Diagnosis not present

## 2018-07-31 DIAGNOSIS — Z85828 Personal history of other malignant neoplasm of skin: Secondary | ICD-10-CM | POA: Diagnosis not present

## 2018-07-31 DIAGNOSIS — D2261 Melanocytic nevi of right upper limb, including shoulder: Secondary | ICD-10-CM | POA: Diagnosis not present

## 2018-07-31 DIAGNOSIS — D2262 Melanocytic nevi of left upper limb, including shoulder: Secondary | ICD-10-CM | POA: Diagnosis not present

## 2018-08-01 DIAGNOSIS — N184 Chronic kidney disease, stage 4 (severe): Secondary | ICD-10-CM | POA: Diagnosis not present

## 2018-08-01 DIAGNOSIS — E1129 Type 2 diabetes mellitus with other diabetic kidney complication: Secondary | ICD-10-CM | POA: Diagnosis not present

## 2018-08-01 DIAGNOSIS — I129 Hypertensive chronic kidney disease with stage 1 through stage 4 chronic kidney disease, or unspecified chronic kidney disease: Secondary | ICD-10-CM | POA: Diagnosis not present

## 2018-08-01 DIAGNOSIS — R600 Localized edema: Secondary | ICD-10-CM | POA: Diagnosis not present

## 2018-08-07 DIAGNOSIS — G301 Alzheimer's disease with late onset: Secondary | ICD-10-CM | POA: Diagnosis not present

## 2018-08-07 DIAGNOSIS — I69319 Unspecified symptoms and signs involving cognitive functions following cerebral infarction: Secondary | ICD-10-CM | POA: Diagnosis not present

## 2018-08-07 DIAGNOSIS — F0280 Dementia in other diseases classified elsewhere without behavioral disturbance: Secondary | ICD-10-CM | POA: Diagnosis not present

## 2018-08-15 ENCOUNTER — Telehealth: Payer: Self-pay | Admitting: Family Medicine

## 2018-08-15 DIAGNOSIS — R0602 Shortness of breath: Secondary | ICD-10-CM | POA: Diagnosis not present

## 2018-08-15 DIAGNOSIS — E785 Hyperlipidemia, unspecified: Secondary | ICD-10-CM | POA: Diagnosis not present

## 2018-08-15 DIAGNOSIS — I340 Nonrheumatic mitral (valve) insufficiency: Secondary | ICD-10-CM | POA: Diagnosis not present

## 2018-08-15 DIAGNOSIS — I10 Essential (primary) hypertension: Secondary | ICD-10-CM | POA: Diagnosis not present

## 2018-08-15 DIAGNOSIS — K219 Gastro-esophageal reflux disease without esophagitis: Secondary | ICD-10-CM | POA: Diagnosis not present

## 2018-08-15 DIAGNOSIS — I25810 Atherosclerosis of coronary artery bypass graft(s) without angina pectoris: Secondary | ICD-10-CM | POA: Diagnosis not present

## 2018-08-15 DIAGNOSIS — I2510 Atherosclerotic heart disease of native coronary artery without angina pectoris: Secondary | ICD-10-CM | POA: Diagnosis not present

## 2018-08-15 NOTE — Chronic Care Management (AMB) (Signed)
Chronic Care Management   Note  08/15/2018 Name: Ray Lamb MRN: [id number] DOB: 02/13/1931  Ray Lamb is a 85 y.o. year old male who is a primary care patient of Crissman, Jeannette How, MD. I reached out to [name] by phone today in response to a referral sent by [name]'s health plan.    [name] was given information about Chronic Care Management services today including:  1. CCM service includes personalized support from designated clinical staff supervised by his physician, including individualized plan of care and coordination with other care providers 2. 24/7 contact phone numbers for assistance for urgent and routine care needs. 3. Service will only be billed when office clinical staff spend 20 minutes or more in a month to coordinate care. 4. Only one practitioner may furnish and bill the service in a calendar month. 5. The patient may stop CCM services at any time (effective at the end of the month) by phone call to the office staff. 6. The patient will be responsible for cost sharing (co-pay) of up to 20% of the service fee (after annual deductible is met).  Patient agreed to services and verbal consent obtained.   Follow up plan: Telephone appointment with CCM team member scheduled for: 09/03/2018  [place]  [email or web address]   ??[phone]

## 2018-08-19 DIAGNOSIS — C44329 Squamous cell carcinoma of skin of other parts of face: Secondary | ICD-10-CM | POA: Diagnosis not present

## 2018-08-19 IMAGING — CT CT ABD-PELV W/O CM
2 of 4 series · 15 of 46 positions shown, 17 images · non-contrast
Comparison: None.

CLINICAL DATA: Sensation of food lodged in abdomen just above the
umbilicus. Sensation onset after eating Vanya chicken. Upper
abdominal pain with nausea and vomiting.

EXAM:
CT ABDOMEN AND PELVIS WITHOUT CONTRAST
TECHNIQUE: Multidetector CT imaging of the abdomen and pelvis was performed
following the standard protocol without IV contrast.

[Series 2: routine abd/pel wo · axial · 0.87mm/px · z∈[-934,-510]mm · 12 of 93 slices shown, 14 images]
[im 4/93  soft-tissue]
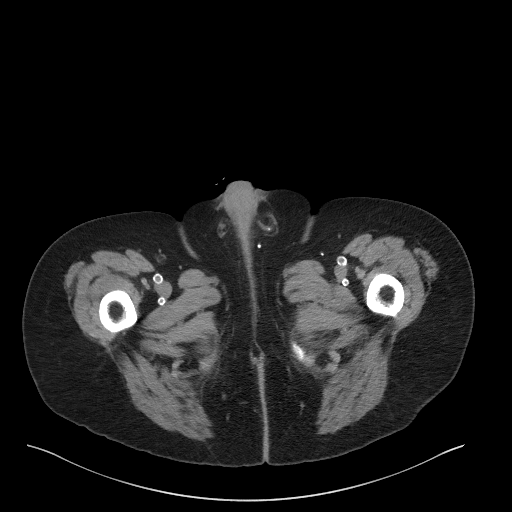
[im 4/93  bone]
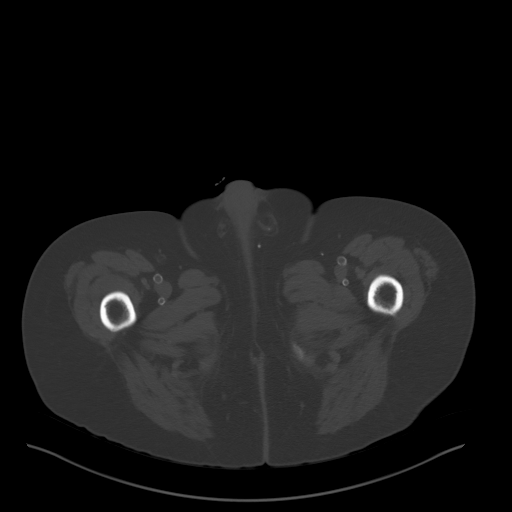
[im 12/93  soft-tissue]
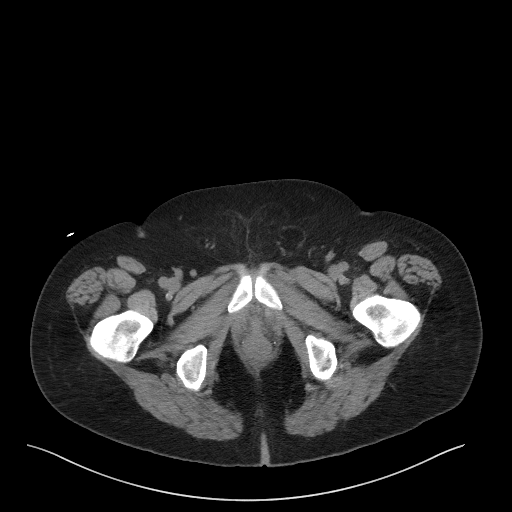
[im 20/93  soft-tissue]
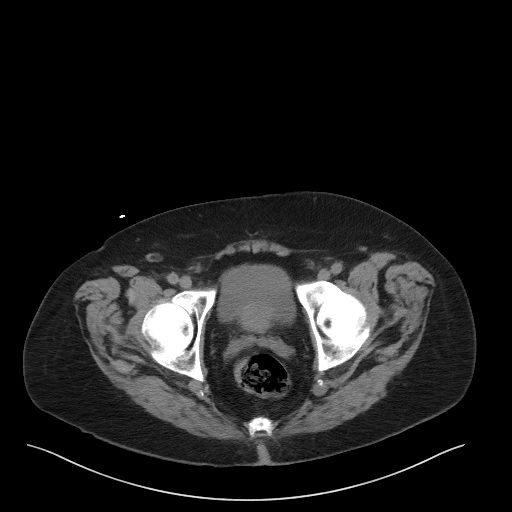
[im 27/93  soft-tissue]
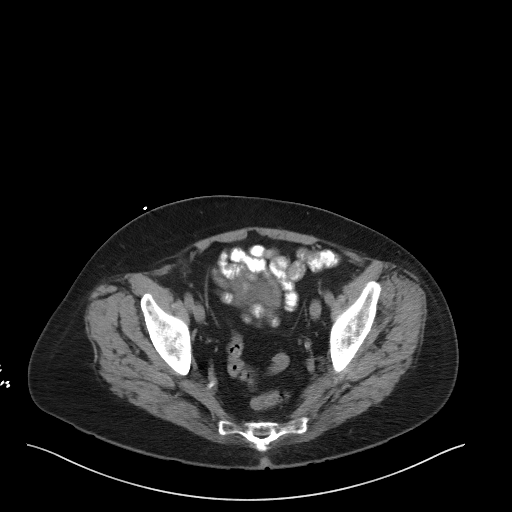
[im 35/93  soft-tissue]
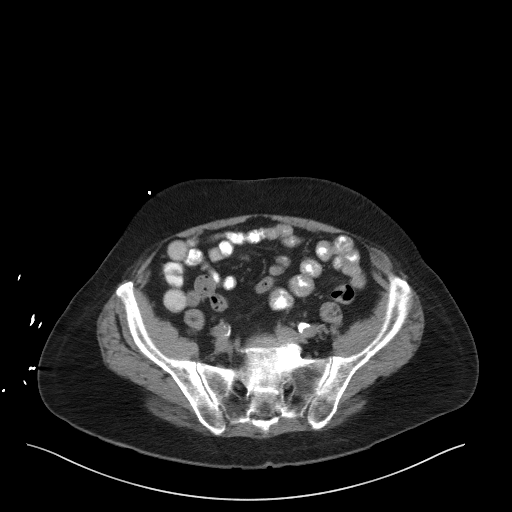
[im 43/93  soft-tissue]
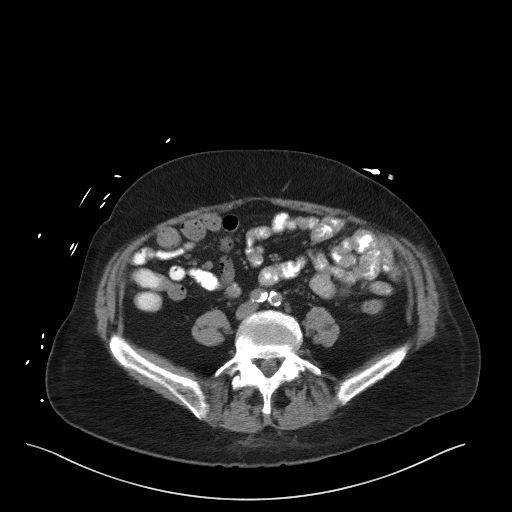
[im 50/93  soft-tissue]
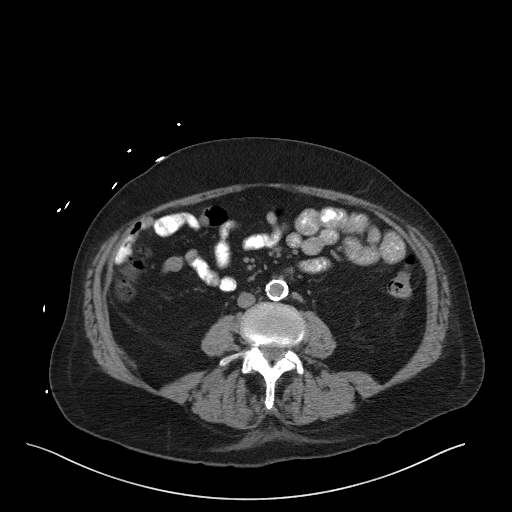
[im 58/93  soft-tissue]
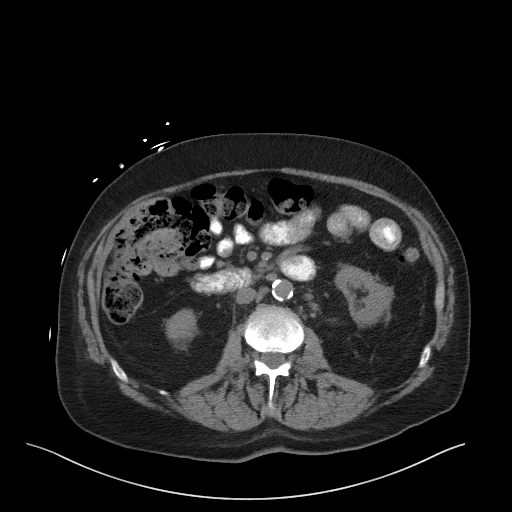
[im 66/93  soft-tissue]
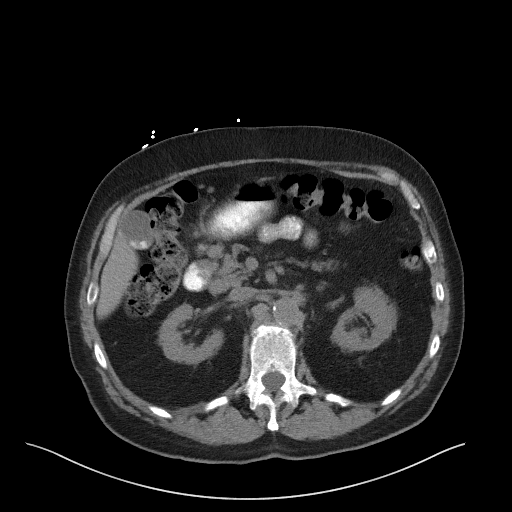
[im 66/93  bone]
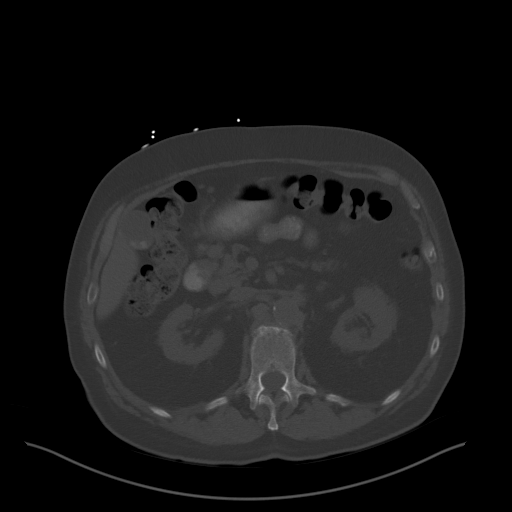
[im 73/93  soft-tissue]
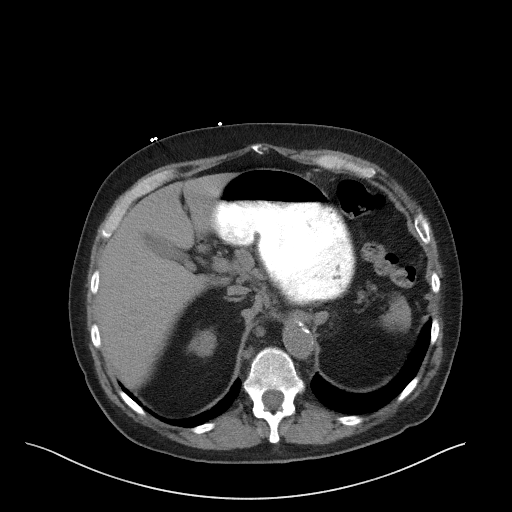
[im 81/93  soft-tissue]
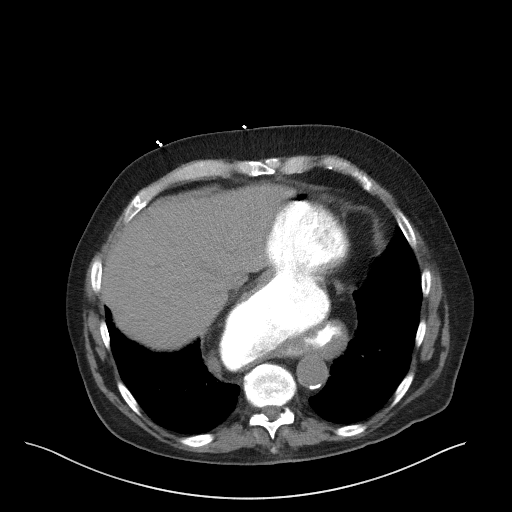
[im 89/93  soft-tissue]
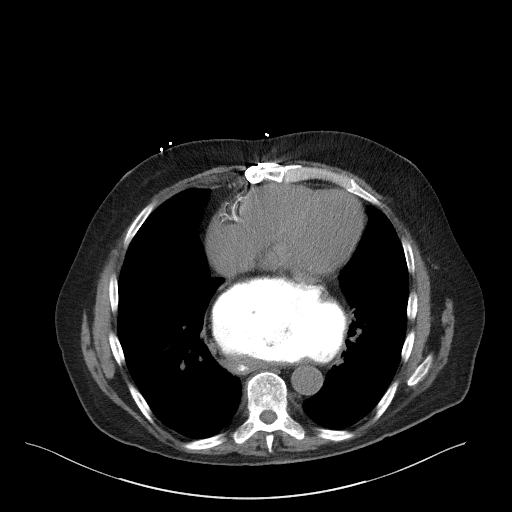

[Series 5: coronal st · coronal · 0.81mm/px · 3 of 96 slices shown]
[im 32/96  soft-tissue]
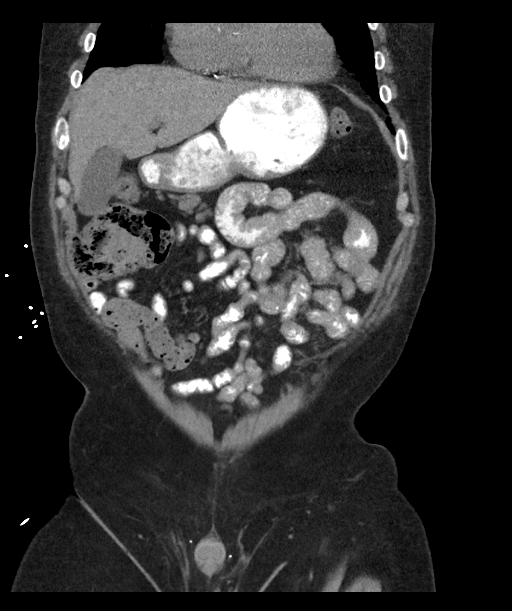
[im 43/96  soft-tissue]
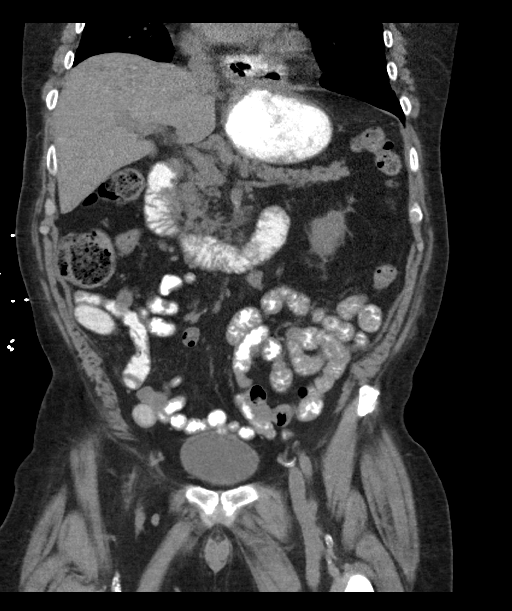
[im 53/96  soft-tissue]
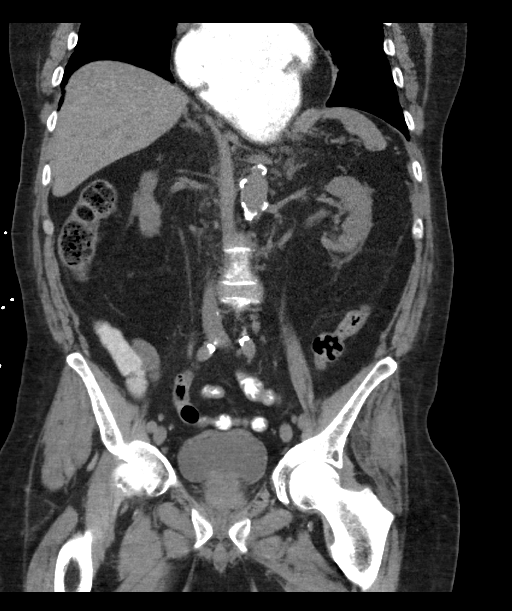

[15 of 46 positions shown; findings below may reference images not displayed]

FINDINGS: Lower chest: Large hiatal hernia. Small lymph nodes adjacent to the
herniated stomach. Coronary artery calcifications. No pleural fluid
or focal airspace disease. 5 mm right lower lobe nodule image 17
series 3.

Hepatobiliary: No evidence of focal hepatic lesion allowing for lack
contrast. Small calcified gallstones within physiologically
distended gallbladder. No pericholecystic inflammation. No biliary
dilatation.

Pancreas: No ductal dilatation or inflammation.

Spleen: Normal in size without focal abnormality.

Adrenals/Urinary Tract: No adrenal nodule. Renal parenchymal
thinning and perinephric edema, likely chronic renal disease. No
hydronephrosis. Urinary bladder is minimally distended without wall
thickening.

Stomach/Bowel: Large hiatal hernia. There is gastric wall thickening
involving the left lateral aspect of the herniated stomach. No
evidence of gastric outlet obstruction. No bowel obstruction.
Enteric contrast is seen throughout small bowel to the mid-distal
ileum. No wall thickening, inflammation or abnormal distention.
Appendix is not confidently visualized. Small to moderate stool
burden without colonic wall thickening or inflammation. No abnormal
rectal distention.

Vascular/Lymphatic: Aortic and branch atherosclerosis, no aneurysm.
Circumaortic left renal vein. There is retroperitoneal adenopathy,
with aortocaval node measuring 12 mm at the level of the left renal
artery, left periaortic node measures 13 mm slightly more
inferiorly. Upper abdominal adenopathy adjacent to the head of the
pancreas and greater curvature of the stomach, largest node
measuring 13 mm short axis. Upper abdominal noted the gastro pathic
ligament measures 15 mm. Prominent lymph nodes about the central
mesentery. No evidence of pelvic or inguinal adenopathy.

Reproductive: Prominent prostate gland spanning 5.9 cm.

Other: No ascites or free air. Tiny fat containing umbilical hernia.
Fat in the left inguinal canal.

Musculoskeletal: There are no acute or suspicious osseous
abnormalities. Age related degenerative change.
IMPRESSION: 1. Large hiatal hernia with gastric wall thickening involving the
left lateral aspect of the herniated stomach. No evidence of gastric
outlet obstruction. No small bowel obstruction. No evidence of bowel
inflammation.
2. Mild retroperitoneal, upper abdominal, and perigastric
adenopathy. Differential also include reactive, metastatic, or lump
for proliferative disorder such as lymphoma. Given the gastric wall
thickening, recommend endoscopy.
3. Incidental note of gallstones. Renal parenchymal atrophy and
perinephric edema suggesting chronic renal disease. Abdominal aortic
atherosclerosis.

## 2018-09-03 ENCOUNTER — Telehealth: Payer: Self-pay | Admitting: Family Medicine

## 2018-09-03 ENCOUNTER — Telehealth: Payer: Medicare Other

## 2018-09-03 NOTE — Telephone Encounter (Signed)
Medication Refill - Medication: sitaGLIPtin (JANUVIA) 25 MG tablet(90 day supply)   Has the patient contacted their pharmacy? yes (Agent: If no, request that the patient contact the pharmacy for the refill.) (Agent: If yes, when and what did the pharmacy advise?)Contact PCP  Preferred Pharmacy (with phone number or street name):  [facility] [place] [place] [facility] [phone] (Phone) [phone] (Fax)     Agent: Please be advised that RX refills may take up to 3 business days. We ask that you follow-up with your pharmacy.

## 2018-09-04 MED ORDER — SITAGLIPTIN PHOSPHATE 25 MG PO TABS: 25.0000 mg | ORAL_TABLET | Freq: Every day | ORAL | 1 refills | Status: DC

## 2018-09-04 MED ORDER — SITAGLIPTIN PHOSPHATE 25 MG PO TABS: 25.0000 mg | ORAL_TABLET | Freq: Every day | ORAL | 3 refills | Status: DC

## 2018-09-04 NOTE — Addendum Note (Signed)
Addended by: Golden Pop A on: 09/04/2018 02:54 PM   Modules accepted: Orders

## 2018-09-05 DIAGNOSIS — R600 Localized edema: Secondary | ICD-10-CM | POA: Diagnosis not present

## 2018-09-05 DIAGNOSIS — E1129 Type 2 diabetes mellitus with other diabetic kidney complication: Secondary | ICD-10-CM | POA: Diagnosis not present

## 2018-09-05 DIAGNOSIS — N184 Chronic kidney disease, stage 4 (severe): Secondary | ICD-10-CM | POA: Diagnosis not present

## 2018-09-05 DIAGNOSIS — I129 Hypertensive chronic kidney disease with stage 1 through stage 4 chronic kidney disease, or unspecified chronic kidney disease: Secondary | ICD-10-CM | POA: Diagnosis not present

## 2018-09-06 IMAGING — MR MR HEAD W/O CM
10 series · 48 of 48 positions shown · non-contrast
Comparison: Head CT 04/10/2009

CLINICAL DATA: Memory loss

EXAM:
MRI HEAD WITHOUT CONTRAST
TECHNIQUE: Multiplanar, multiecho pulse sequences of the brain and surrounding
structures were obtained without intravenous contrast.

[Series 2: T1 · sagittal · 5.0mm · 0.45mm/px · 2 of 27 slices shown (1 of 2)]
[im 1/27]
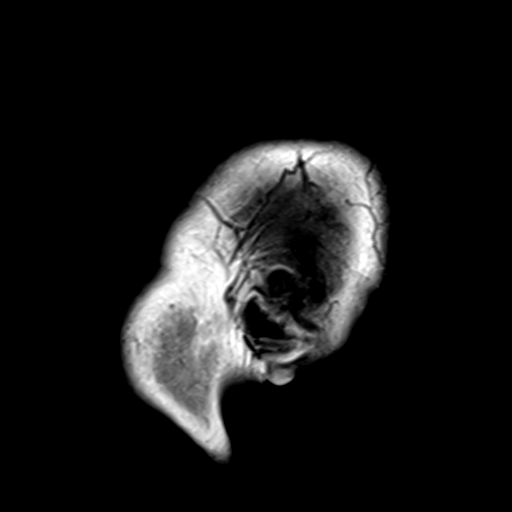
[im 27/27]
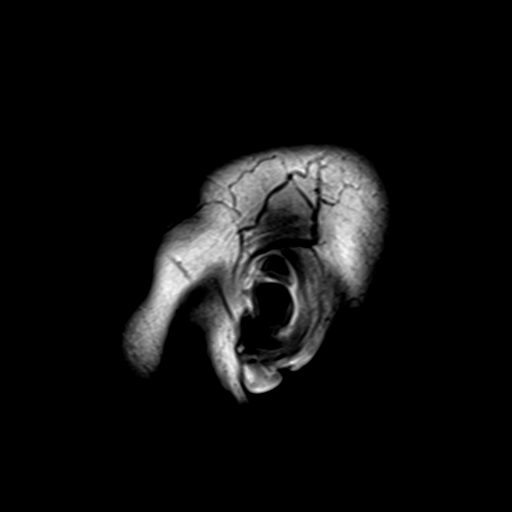

[Series 4: DWI · axial · 3.0mm · 1.80mm/px · z∈[-54,+106]mm · 7 of 55 slices shown (1 of 4)]
[im 1/55]
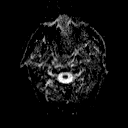
[im 10/55]
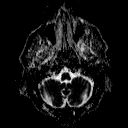
[im 19/55]
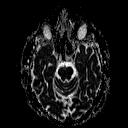
[im 28/55]
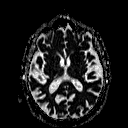
[im 37/55]
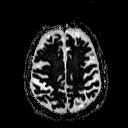
[im 46/55]
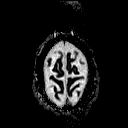
[im 55/55]
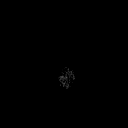

[Series 6: DWI · coronal · 3.0mm · 1.80mm/px · 6 of 47 slices shown (2 of 4)]
[im 1/47]
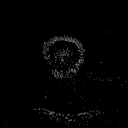
[im 10/47]
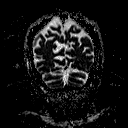
[im 19/47]
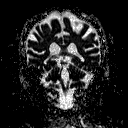
[im 28/47]
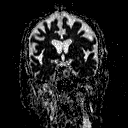
[im 37/47]
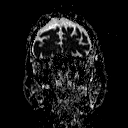
[im 47/47]
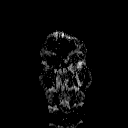

[Series 7: T2 · axial · 5.0mm · 0.60mm/px · z∈[-58,+109]mm · 3 of 27 slices shown (1 of 3)]
[im 1/27]
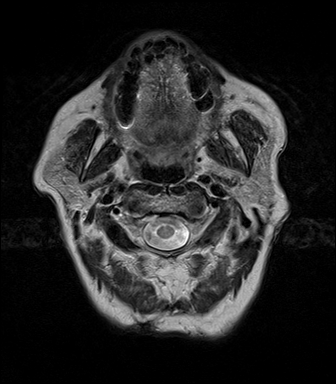
[im 14/27]
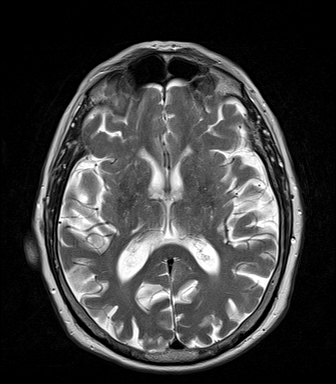
[im 27/27]
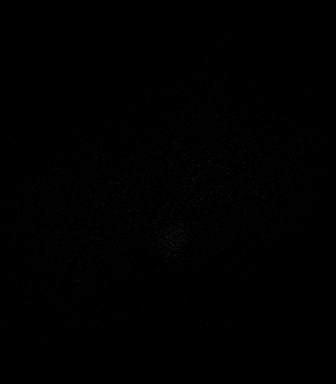

[Series 8: FLAIR · axial · 5.0mm · 0.45mm/px · z∈[-58,+109]mm · 3 of 27 slices shown]
[im 1/27]
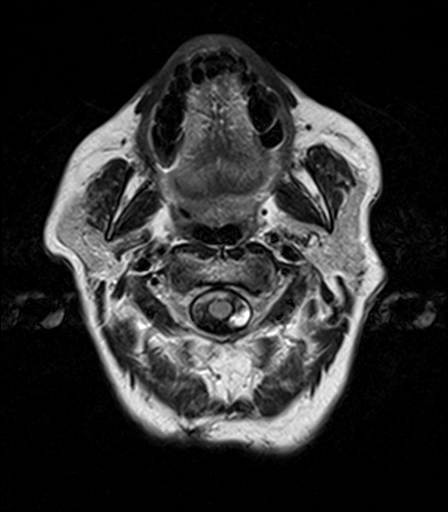
[im 14/27]
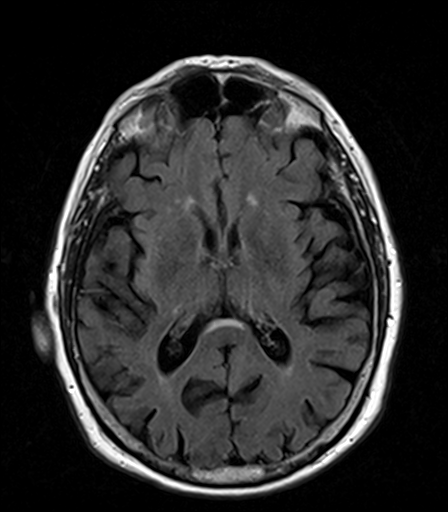
[im 27/27]
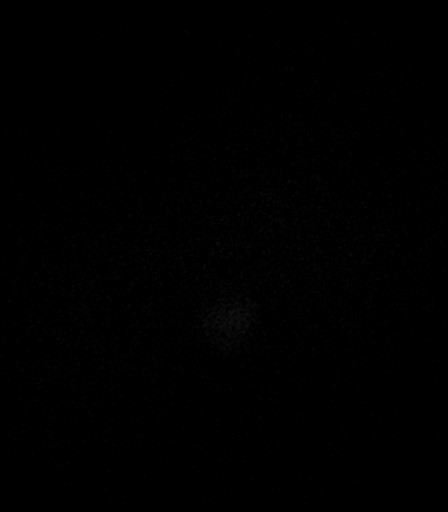

[Series 9: T2 · axial · 5.0mm · 0.45mm/px · z∈[-58,+109]mm · 3 of 27 slices shown (2 of 3)]
[im 1/27]
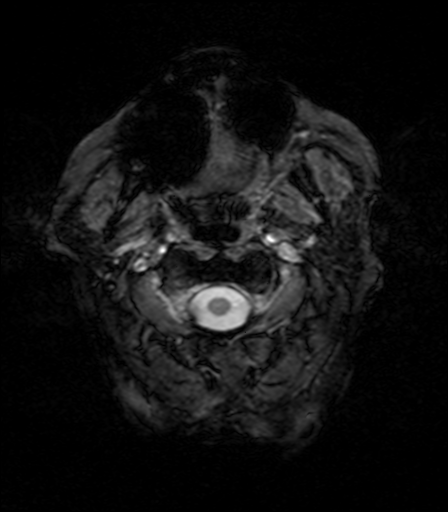
[im 14/27]
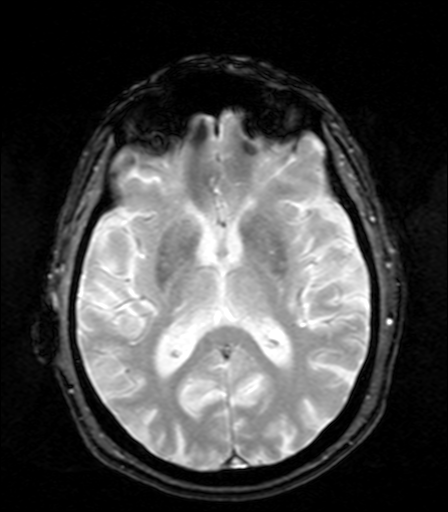
[im 27/27]
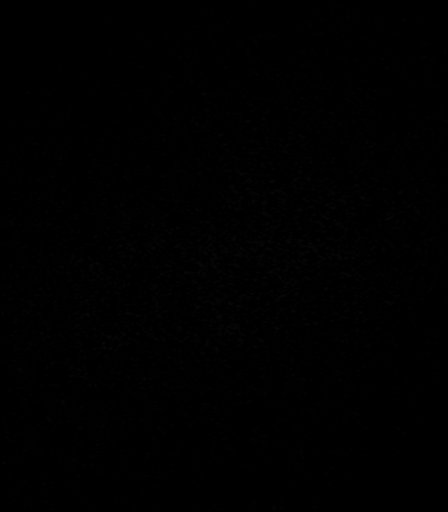

[Series 10: T1 · axial · 3.0mm · 1.00mm/px · z∈[-53,+109]mm · 7 of 56 slices shown (2 of 2)]
[im 1/56]
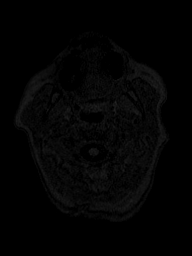
[im 10/56]
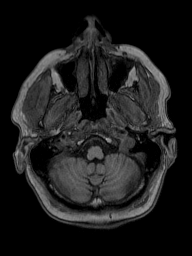
[im 19/56]
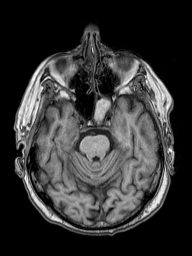
[im 28/56]
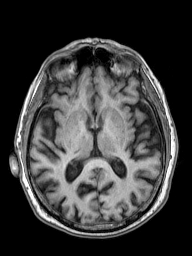
[im 37/56]
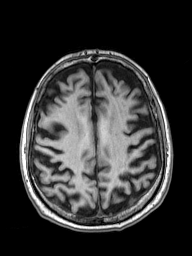
[im 46/56]
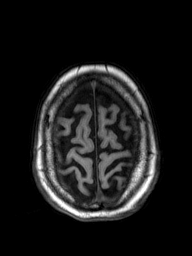
[im 56/56]
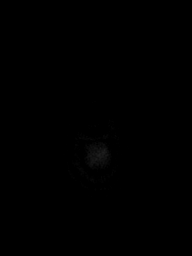

[Series 11: T2 · coronal · 5.0mm · 0.49mm/px · 4 of 29 slices shown (3 of 3)]
[im 1/29]
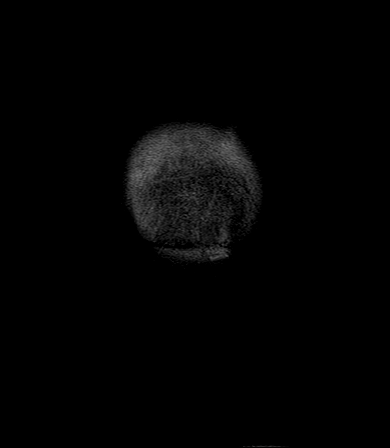
[im 10/29]
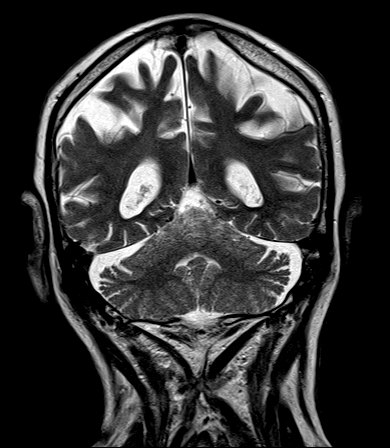
[im 19/29]
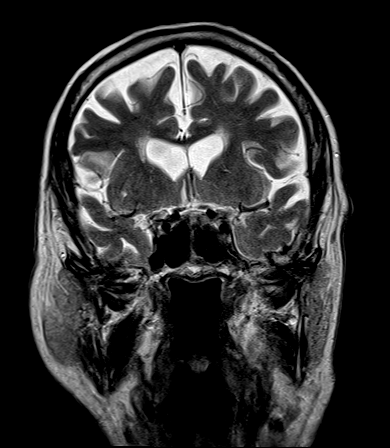
[im 29/29]
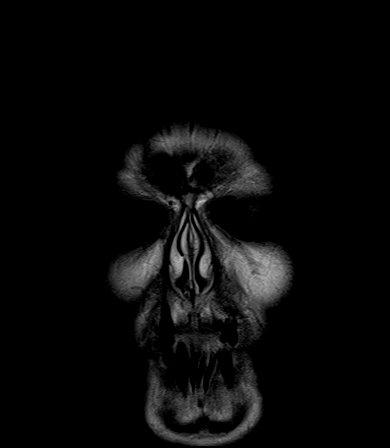

[Series 100: DWI · axial · 3.0mm · 1.80mm/px · z∈[-54,+106]mm · 7 of 54 slices shown (3 of 4)]
[im 1/54]
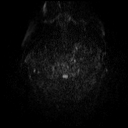
[im 9/54]
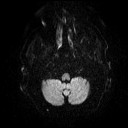
[im 18/54]
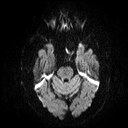
[im 27/54]
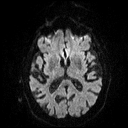
[im 36/54]
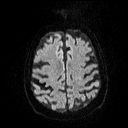
[im 45/54]
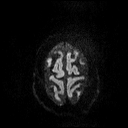
[im 54/54]
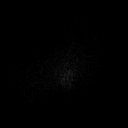

[Series 101: DWI · coronal · 3.0mm · 1.80mm/px · 6 of 47 slices shown (4 of 4)]
[im 1/47]
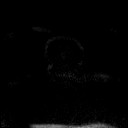
[im 10/47]
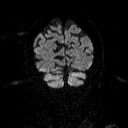
[im 19/47]
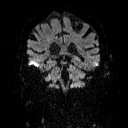
[im 28/47]
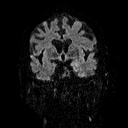
[im 37/47]
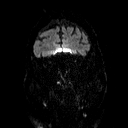
[im 47/47]
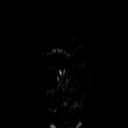

[48 of 48 positions shown; findings below may reference images not displayed]

FINDINGS: Brain: No reversible finding including extra-axial collection, mass,
or hydrocephalus. No abnormal cortical or thalamic diffusion
abnormality. Mild to moderate cortical atrophy for age without
specific pattern. Mesial temporal volume loss is mild and congruent.
Small remote infarcts in the bilateral cerebellum. Probable remote
lacunar infarct along the right posterior internal capsule. No acute
infarct or hemorrhage. No chronic blood products.

Vascular: Normal flow voids.

Skull and upper cervical spine: Normal marrow signal.

Sinuses/Orbits: Completely opacified left sphenoid sinus with
central inspissated secretions signal.
IMPRESSION: 1. No specific or reversible explanation for memory loss.
2. Mild to moderate cortical atrophy without specific pattern.
3. Chronically obstructed left sphenoid sinus.

## 2018-09-12 ENCOUNTER — Encounter: Payer: Self-pay | Admitting: Family Medicine

## 2018-09-12 ENCOUNTER — Other Ambulatory Visit: Payer: Self-pay

## 2018-09-12 ENCOUNTER — Ambulatory Visit (INDEPENDENT_AMBULATORY_CARE_PROVIDER_SITE_OTHER): Payer: Medicare Other | Admitting: Family Medicine

## 2018-09-12 DIAGNOSIS — I25709 Atherosclerosis of coronary artery bypass graft(s), unspecified, with unspecified angina pectoris: Secondary | ICD-10-CM

## 2018-09-12 DIAGNOSIS — E782 Mixed hyperlipidemia: Secondary | ICD-10-CM

## 2018-09-12 DIAGNOSIS — I129 Hypertensive chronic kidney disease with stage 1 through stage 4 chronic kidney disease, or unspecified chronic kidney disease: Secondary | ICD-10-CM | POA: Diagnosis not present

## 2018-09-12 DIAGNOSIS — I10 Essential (primary) hypertension: Secondary | ICD-10-CM | POA: Diagnosis not present

## 2018-09-12 DIAGNOSIS — E1122 Type 2 diabetes mellitus with diabetic chronic kidney disease: Secondary | ICD-10-CM

## 2018-09-12 DIAGNOSIS — N184 Chronic kidney disease, stage 4 (severe): Secondary | ICD-10-CM | POA: Diagnosis not present

## 2018-09-12 MED ORDER — GLIPIZIDE 5 MG PO TABS: 5.0000 mg | ORAL_TABLET | Freq: Every day | ORAL | 1 refills | Status: DC

## 2018-09-12 MED ORDER — SITAGLIPTIN PHOSPHATE 25 MG PO TABS: 25.0000 mg | ORAL_TABLET | Freq: Every day | ORAL | 1 refills | Status: DC

## 2018-09-12 NOTE — Assessment & Plan Note (Signed)
The current medical regimen is effective;  continue present plan and medications.  

## 2018-09-12 NOTE — Progress Notes (Addendum)
BP (!) 136/51    Subjective:    Patient ID: Ray Lamb, male    DOB: 1931-01-28, 85 y.o.   MRN: [id number]  HPI: Ray Lamb is a 85 y.o. male  Med check Discussed with patient all in all doing well good reports from nephrology doctors. Has increased Aricept to 10 mg and seems to be doing well. Unable to take Remeron due to excessive drowsiness. Diabetes blood pressure all stable.   Relevant past medical, surgical, family and social history reviewed and updated as indicated. Interim medical history since our last visit reviewed. Allergies and medications reviewed and updated.  Review of Systems  Constitutional: Negative.   Respiratory: Negative.   Cardiovascular: Negative.     Per HPI unless specifically indicated above     Objective:    BP (!) 136/51   Wt Readings from Last 3 Encounters:  07/01/18 135 lb (61.2 kg)  04/09/18 154 lb 4 oz (70 kg)  03/14/18 152 lb 2 oz (69 kg)    Physical Exam  Results for orders placed or performed during the hospital encounter of 07/01/18  Hemoglobin and hematocrit, blood  Result Value Ref Range   Hemoglobin 9.5 (L) 13.0 - 17.0 g/dL   HCT 30.1 (L) 39.0 - 52.0 %  Glucose, capillary  Result Value Ref Range   Glucose-Capillary 114 (H) 70 - 99 mg/dL  Basic metabolic panel  Result Value Ref Range   Sodium 134 (L) 135 - 145 mmol/L   Potassium 4.1 3.5 - 5.1 mmol/L   Chloride 99 98 - 111 mmol/L   CO2 26 22 - 32 mmol/L   Glucose, Bld 115 (H) 70 - 99 mg/dL   BUN 82 (H) 8 - 23 mg/dL   Creatinine, Ser 3.15 (H) 0.61 - 1.24 mg/dL   Calcium 8.8 (L) 8.9 - 10.3 mg/dL   GFR calc non Af Amer 17 (L) >60 mL/min   GFR calc Af Amer 20 (L) >60 mL/min   Anion gap 9 5 - 15  Glucose, capillary  Result Value Ref Range   Glucose-Capillary 144 (H) 70 - 99 mg/dL      Assessment & Plan:   Problem List Items Addressed This Visit      Cardiovascular and Mediastinum   Hypertension    The current medical regimen is effective;  continue present  plan and medications.       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         Endocrine   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 disease ([place])    The current medical regimen is effective;  continue present plan and medications.       Relevant Medications   glipiZIDE (GLUCOTROL) 5 MG tablet   sitaGLIPtin (JANUVIA) 25 MG tablet     Other   Mixed hyperlipidemia    The current medical regimen is effective;  continue present plan and medications.         Telemedicine using audio/video telecommunications for a synchronous communication visit. Today's visit due to COVID-19 isolation precautions I connected with and verified that I am speaking with the correct person using two identifiers.   I discussed the limitations, risks, security and privacy concerns of performing an evaluation and management service by telecommunication and the availability of in person appointments. I also discussed with the patient that there may be a patient responsible charge related  to this service. The patient expressed understanding and agreed to proceed. The patient's location is home. I am at home.   I discussed the assessment and treatment plan with the patient. The patient was provided an opportunity to ask questions and all were answered. The patient agreed with the plan and demonstrated an understanding of the instructions.   The patient was advised to call back or seek an in-person evaluation if the symptoms worsen or if the condition fails to improve as anticipated.   I provided 21+ minutes of time during this encounter.  Follow up plan: Return in about 3 months (around 12/13/2018) for Hemoglobin A1c.

## 2018-09-27 ENCOUNTER — Other Ambulatory Visit: Payer: Self-pay

## 2018-09-27 ENCOUNTER — Other Ambulatory Visit: Payer: Medicare Other

## 2018-09-27 DIAGNOSIS — E1122 Type 2 diabetes mellitus with diabetic chronic kidney disease: Secondary | ICD-10-CM

## 2018-09-27 DIAGNOSIS — N184 Chronic kidney disease, stage 4 (severe): Secondary | ICD-10-CM | POA: Diagnosis not present

## 2018-09-27 DIAGNOSIS — E782 Mixed hyperlipidemia: Secondary | ICD-10-CM

## 2018-09-27 LAB — BAYER DCA HB A1C WAIVED: HB A1C (BAYER DCA - WAIVED): 5.9 % (ref <7.0)

## 2018-09-27 LAB — LP+ALT+AST PICCOLO, WAIVED
ALT (SGPT) Piccolo, Waived: 24 U/L (ref 10–47)
AST (SGOT) Piccolo, Waived: 32 U/L (ref 11–38)
Chol/HDL Ratio Piccolo,Waive: 2.3 mg/dL
Cholesterol Piccolo, Waived: 103 mg/dL (ref <200)
HDL Chol Piccolo, Waived: 46 mg/dL — ABNORMAL LOW (ref >59)
LDL Chol Calc Piccolo Waived: 47 mg/dL (ref <100)
Triglycerides Piccolo,Waived: 51 mg/dL (ref <150)
VLDL Chol Calc Piccolo,Waive: 10 mg/dL (ref <30)

## 2018-09-28 ENCOUNTER — Encounter: Payer: Self-pay | Admitting: Family Medicine

## 2018-09-30 DIAGNOSIS — M79675 Pain in left toe(s): Secondary | ICD-10-CM | POA: Diagnosis not present

## 2018-09-30 DIAGNOSIS — M79674 Pain in right toe(s): Secondary | ICD-10-CM | POA: Diagnosis not present

## 2018-09-30 DIAGNOSIS — B351 Tinea unguium: Secondary | ICD-10-CM | POA: Diagnosis not present

## 2018-10-03 DIAGNOSIS — H40053 Ocular hypertension, bilateral: Secondary | ICD-10-CM | POA: Diagnosis not present

## 2018-10-08 ENCOUNTER — Telehealth: Payer: Self-pay

## 2018-10-15 ENCOUNTER — Other Ambulatory Visit: Payer: Self-pay

## 2018-10-15 DIAGNOSIS — E7800 Pure hypercholesterolemia, unspecified: Secondary | ICD-10-CM

## 2018-10-15 NOTE — Telephone Encounter (Signed)
Request for new Atorvastatin prescription to be sent to [facility].  Rx last sent March 14, 2018 (1 year supply) to [facility].

## 2018-10-16 MED ORDER — ATORVASTATIN CALCIUM 80 MG PO TABS: 80.0000 mg | ORAL_TABLET | Freq: Every day | ORAL | 4 refills | Status: DC

## 2018-11-13 ENCOUNTER — Telehealth: Payer: Self-pay

## 2018-11-15 DIAGNOSIS — M79662 Pain in left lower leg: Secondary | ICD-10-CM | POA: Diagnosis not present

## 2018-11-26 DIAGNOSIS — E785 Hyperlipidemia, unspecified: Secondary | ICD-10-CM | POA: Diagnosis not present

## 2018-11-26 DIAGNOSIS — I340 Nonrheumatic mitral (valve) insufficiency: Secondary | ICD-10-CM | POA: Diagnosis not present

## 2018-11-26 DIAGNOSIS — I25810 Atherosclerosis of coronary artery bypass graft(s) without angina pectoris: Secondary | ICD-10-CM | POA: Diagnosis not present

## 2018-11-26 DIAGNOSIS — K219 Gastro-esophageal reflux disease without esophagitis: Secondary | ICD-10-CM | POA: Diagnosis not present

## 2018-11-26 DIAGNOSIS — M79662 Pain in left lower leg: Secondary | ICD-10-CM | POA: Diagnosis not present

## 2018-11-26 DIAGNOSIS — I10 Essential (primary) hypertension: Secondary | ICD-10-CM | POA: Diagnosis not present

## 2018-11-26 DIAGNOSIS — I2510 Atherosclerotic heart disease of native coronary artery without angina pectoris: Secondary | ICD-10-CM | POA: Diagnosis not present

## 2018-11-26 DIAGNOSIS — R0602 Shortness of breath: Secondary | ICD-10-CM | POA: Diagnosis not present

## 2018-11-27 ENCOUNTER — Other Ambulatory Visit: Payer: Self-pay

## 2018-11-27 ENCOUNTER — Ambulatory Visit (INDEPENDENT_AMBULATORY_CARE_PROVIDER_SITE_OTHER): Payer: Medicare Other

## 2018-11-27 DIAGNOSIS — Z23 Encounter for immunization: Secondary | ICD-10-CM

## 2018-12-02 DIAGNOSIS — E1122 Type 2 diabetes mellitus with diabetic chronic kidney disease: Secondary | ICD-10-CM | POA: Diagnosis not present

## 2018-12-02 DIAGNOSIS — R8290 Unspecified abnormal findings in urine: Secondary | ICD-10-CM | POA: Diagnosis not present

## 2018-12-02 DIAGNOSIS — E875 Hyperkalemia: Secondary | ICD-10-CM | POA: Diagnosis not present

## 2018-12-02 DIAGNOSIS — N184 Chronic kidney disease, stage 4 (severe): Secondary | ICD-10-CM | POA: Diagnosis not present

## 2018-12-02 DIAGNOSIS — I10 Essential (primary) hypertension: Secondary | ICD-10-CM | POA: Diagnosis not present

## 2018-12-02 DIAGNOSIS — R600 Localized edema: Secondary | ICD-10-CM | POA: Diagnosis not present

## 2018-12-06 ENCOUNTER — Other Ambulatory Visit: Payer: Self-pay | Admitting: Family Medicine

## 2018-12-06 DIAGNOSIS — E1122 Type 2 diabetes mellitus with diabetic chronic kidney disease: Secondary | ICD-10-CM | POA: Diagnosis not present

## 2018-12-06 DIAGNOSIS — N189 Chronic kidney disease, unspecified: Secondary | ICD-10-CM | POA: Diagnosis not present

## 2018-12-06 DIAGNOSIS — I129 Hypertensive chronic kidney disease with stage 1 through stage 4 chronic kidney disease, or unspecified chronic kidney disease: Secondary | ICD-10-CM | POA: Diagnosis not present

## 2018-12-06 DIAGNOSIS — R600 Localized edema: Secondary | ICD-10-CM | POA: Diagnosis not present

## 2018-12-06 DIAGNOSIS — N184 Chronic kidney disease, stage 4 (severe): Secondary | ICD-10-CM | POA: Diagnosis not present

## 2018-12-06 NOTE — Telephone Encounter (Signed)
Forwarding medication refill request to PCP for review. 

## 2018-12-16 ENCOUNTER — Ambulatory Visit: Payer: Self-pay | Admitting: Family Medicine

## 2018-12-16 DIAGNOSIS — I129 Hypertensive chronic kidney disease with stage 1 through stage 4 chronic kidney disease, or unspecified chronic kidney disease: Secondary | ICD-10-CM | POA: Diagnosis not present

## 2018-12-16 DIAGNOSIS — N184 Chronic kidney disease, stage 4 (severe): Secondary | ICD-10-CM | POA: Diagnosis not present

## 2018-12-16 DIAGNOSIS — R600 Localized edema: Secondary | ICD-10-CM | POA: Diagnosis not present

## 2018-12-16 DIAGNOSIS — N189 Chronic kidney disease, unspecified: Secondary | ICD-10-CM | POA: Diagnosis not present

## 2018-12-16 DIAGNOSIS — E1122 Type 2 diabetes mellitus with diabetic chronic kidney disease: Secondary | ICD-10-CM | POA: Diagnosis not present

## 2018-12-18 DIAGNOSIS — K219 Gastro-esophageal reflux disease without esophagitis: Secondary | ICD-10-CM | POA: Diagnosis not present

## 2018-12-18 DIAGNOSIS — I2510 Atherosclerotic heart disease of native coronary artery without angina pectoris: Secondary | ICD-10-CM | POA: Diagnosis not present

## 2018-12-18 DIAGNOSIS — I25810 Atherosclerosis of coronary artery bypass graft(s) without angina pectoris: Secondary | ICD-10-CM | POA: Diagnosis not present

## 2018-12-18 DIAGNOSIS — I10 Essential (primary) hypertension: Secondary | ICD-10-CM | POA: Diagnosis not present

## 2018-12-18 DIAGNOSIS — R0602 Shortness of breath: Secondary | ICD-10-CM | POA: Diagnosis not present

## 2018-12-18 DIAGNOSIS — I340 Nonrheumatic mitral (valve) insufficiency: Secondary | ICD-10-CM | POA: Diagnosis not present

## 2018-12-18 DIAGNOSIS — E785 Hyperlipidemia, unspecified: Secondary | ICD-10-CM | POA: Diagnosis not present

## 2018-12-23 DIAGNOSIS — L570 Actinic keratosis: Secondary | ICD-10-CM | POA: Diagnosis not present

## 2018-12-23 DIAGNOSIS — Z08 Encounter for follow-up examination after completed treatment for malignant neoplasm: Secondary | ICD-10-CM | POA: Diagnosis not present

## 2018-12-23 DIAGNOSIS — Z85828 Personal history of other malignant neoplasm of skin: Secondary | ICD-10-CM | POA: Diagnosis not present

## 2018-12-23 DIAGNOSIS — X32XXXA Exposure to sunlight, initial encounter: Secondary | ICD-10-CM | POA: Diagnosis not present

## 2018-12-23 DIAGNOSIS — D485 Neoplasm of uncertain behavior of skin: Secondary | ICD-10-CM | POA: Diagnosis not present

## 2018-12-25 ENCOUNTER — Ambulatory Visit: Payer: Medicare Other | Admitting: *Deleted

## 2018-12-25 DIAGNOSIS — I10 Essential (primary) hypertension: Secondary | ICD-10-CM

## 2018-12-25 NOTE — Chronic Care Management (AMB) (Signed)
  Chronic Care Management   Telephone Outreach Note  12/25/2018 Name: Ray Lamb MRN: [id number] DOB: 11/30/1930  Referred by: health plan.   I reached out to Mr. [name] today by phone in response to a referral sent by Mr. Shirlee Latch [place]'s health plan. Mr. HARLOW CARRIZALES and I briefly discussed care management needs related to HTN. Mr. Godshall stated he had no needs at this time but would like to keep my number for future needs.   SDOH (Social Determinants of Health) screening performed today: None. See Care Plan for related entries.   Outpatient Encounter Medications as of 12/25/2018  Medication Sig  . atorvastatin (LIPITOR) 80 MG tablet Take 1 tablet (80 mg total) by mouth at bedtime.  . clopidogrel (PLAVIX) 75 MG tablet TAKE 1 TABLET DAILY  . donepezil (ARICEPT) 5 MG tablet Take 10 mg by mouth at bedtime.  Marland Kitchen ezetimibe (ZETIA) 10 MG tablet Take 1 tablet (10 mg total) by mouth daily.  . furosemide (LASIX) 40 MG tablet Take 40 mg by mouth daily as needed for fluid.   Marland Kitchen glipiZIDE (GLUCOTROL) 5 MG tablet Take 1 tablet (5 mg total) by mouth daily before breakfast.  . isosorbide mononitrate (IMDUR) 30 MG 24 hr tablet Take 30 mg by mouth daily.   Marland Kitchen JANUVIA 25 MG tablet TAKE 1 TABLET BY MOUTH EVERY DAY  . metoprolol tartrate (LOPRESSOR) 50 MG tablet Take 1 tablet (50 mg total) by mouth every 12 (twelve) hours.  . Multiple Vitamin (MULTIVITAMIN) tablet Take 1 tablet by mouth daily.  . timolol (TIMOPTIC) 0.5 % ophthalmic solution Place 1 drop into both eyes daily.   . TURMERIC PO Take 1,300 mg by mouth daily.    Facility-Administered Encounter Medications as of 12/25/2018  Medication  . sodium chloride flush (NS) 0.9 % injection 3 mL    Mr. Gilmer was given information about Chronic Care Management services today including:  1. CCM service includes personalized support from designated clinical staff supervised by his physician, including individualized plan of care and coordination with other  care providers 2. 24/7 contact phone numbers for assistance for urgent and routine care needs. 3. Service will only be billed when office clinical staff spend 20 minutes or more in a month to coordinate care. 4. Only one practitioner may furnish and bill the service in a calendar month. 5. The patient may stop CCM services at any time (effective at the end of the month) by phone call to the office staff. 6. The patient will be responsible for cost sharing (co-pay) of up to 20% of the service fee (after annual deductible is met).  Patient did not agree to enrollment in care management services and does not wish to consider at this time.  The patient has been provided with contact information for the care management team and has been advised to call with any health related questions or concerns.  The care management team is available to follow up with the patient after provider conversation with the patient regarding recommendation for care management engagement and subsequent re-referral to the care management team.    Park Liter, MD has been notified of this outreach and Mr. Shirlee Latch [place]'s decision and plan.   Merlene Morse Lareen Mullings RN, BSN Nurse Case Editor, commissioning [facility]  [phone]) Business Mobile

## 2019-01-02 ENCOUNTER — Encounter: Payer: Self-pay | Admitting: Family Medicine

## 2019-01-02 ENCOUNTER — Ambulatory Visit (INDEPENDENT_AMBULATORY_CARE_PROVIDER_SITE_OTHER): Payer: Medicare Other | Admitting: Family Medicine

## 2019-01-02 ENCOUNTER — Other Ambulatory Visit: Payer: Self-pay

## 2019-01-02 VITALS — BP 166/74 | HR 68 | Temp 97.5°F | Ht 65.0 in | Wt 148.0 lb

## 2019-01-02 DIAGNOSIS — I129 Hypertensive chronic kidney disease with stage 1 through stage 4 chronic kidney disease, or unspecified chronic kidney disease: Secondary | ICD-10-CM

## 2019-01-02 DIAGNOSIS — R519 Headache, unspecified: Secondary | ICD-10-CM | POA: Diagnosis not present

## 2019-01-02 DIAGNOSIS — N184 Chronic kidney disease, stage 4 (severe): Secondary | ICD-10-CM | POA: Diagnosis not present

## 2019-01-02 DIAGNOSIS — I25709 Atherosclerosis of coronary artery bypass graft(s), unspecified, with unspecified angina pectoris: Secondary | ICD-10-CM | POA: Diagnosis not present

## 2019-01-02 DIAGNOSIS — G8929 Other chronic pain: Secondary | ICD-10-CM | POA: Diagnosis not present

## 2019-01-02 DIAGNOSIS — E782 Mixed hyperlipidemia: Secondary | ICD-10-CM | POA: Diagnosis not present

## 2019-01-02 DIAGNOSIS — E1122 Type 2 diabetes mellitus with diabetic chronic kidney disease: Secondary | ICD-10-CM | POA: Diagnosis not present

## 2019-01-02 LAB — MICROALBUMIN, URINE WAIVED
Creatinine, Urine Waived: 100 mg/dL (ref 10–300)
Microalb, Ur Waived: 150 mg/L — ABNORMAL HIGH (ref 0–19)
Microalb/Creat Ratio: >300 mg/g — ABNORMAL HIGH (ref <30)

## 2019-01-02 LAB — UA/M W/RFLX CULTURE, ROUTINE
Bilirubin, UA: NEGATIVE
Glucose, UA: NEGATIVE
Ketones, UA: NEGATIVE
Leukocytes,UA: NEGATIVE
Nitrite, UA: NEGATIVE
Specific Gravity, UA: 1.025 (ref 1.005–1.030)
Urobilinogen, Ur: 0.2 mg/dL (ref 0.2–1.0)
pH, UA: 5 (ref 5.0–7.5)

## 2019-01-02 LAB — MICROSCOPIC EXAMINATION: Bacteria, UA: NONE SEEN

## 2019-01-02 LAB — BAYER DCA HB A1C WAIVED: HB A1C (BAYER DCA - WAIVED): 5.7 % (ref <7.0)

## 2019-01-02 MED ORDER — ISOSORBIDE MONONITRATE ER 30 MG PO TB24: 45.0000 mg | ORAL_TABLET | Freq: Every day | ORAL | 2 refills | Status: DC

## 2019-01-02 NOTE — Progress Notes (Signed)
BP (!) 166/74    Pulse 68    Temp (!) 97.5 F (36.4 C) (Oral)    Ht 5' 5" (1.651 m)    Wt 148 lb (67.1 kg)    SpO2 100%    BMI 24.63 kg/m    Subjective:    Patient ID: Ray Lamb, male    DOB: 20-Nov-1930, 85 y.o.   MRN: [id number]  HPI: Ray Lamb is a 85 y.o. male  Chief Complaint  Patient presents with   Diabetes   Hypertension   Hyperlipidemia   Has been having headaches over his L eye for about the past month. He notes that he has been taking tylenol and it has been helping with the headache a little bit, but it doesn't totally go away- has been taking it about 1x a day. He has never had headaches before, and notes that this feels bad. He has not been having any changes in his vision. No numbness or tingling. No weakness. He is otherwise feeling well with no other concerns or complaints at this time.   HYPERTENSION / HYPERLIPIDEMIA Satisfied with current treatment? yes Duration of hypertension: chronic BP monitoring frequency: not checking BP medication side effects: no Past BP meds: imdur, metoprolol, carvedilol Duration of hyperlipidemia: chronic Cholesterol medication side effects: no Cholesterol supplements: none Past cholesterol medications: atorvastatin Medication compliance: excellent compliance Aspirin: yes Recent stressors: no Recurrent headaches: yes Visual changes: no Palpitations: no Dyspnea: no Chest pain: no Lower extremity edema: yes Dizzy/lightheaded: no  DIABETES Hypoglycemic episodes:no Polydipsia/polyuria: no Visual disturbance: no Chest pain: no Paresthesias: no Glucose Monitoring: yes  Accucheck frequency: Not Checking Taking Insulin?: no Blood Pressure Monitoring: not checking Retinal Examination: Not up to Date Foot Exam: Not up to Date Diabetic Education: Completed Pneumovax: Up to Date Influenza: Up to Date Aspirin: no  Relevant past medical, surgical, family and social history reviewed and updated as indicated. Interim  medical history since our last visit reviewed. Allergies and medications reviewed and updated.  Review of Systems  Constitutional: Negative.   HENT: Negative.   Respiratory: Negative.   Cardiovascular: Negative.   Musculoskeletal: Negative.   Neurological: Negative.   Psychiatric/Behavioral: Negative.     Per HPI unless specifically indicated above     Objective:    BP (!) 166/74    Pulse 68    Temp (!) 97.5 F (36.4 C) (Oral)    Ht 5' 5" (1.651 m)    Wt 148 lb (67.1 kg)    SpO2 100%    BMI 24.63 kg/m   Wt Readings from Last 3 Encounters:  01/02/19 148 lb (67.1 kg)  07/01/18 135 lb (61.2 kg)  04/09/18 154 lb 4 oz (70 kg)    Physical Exam Vitals signs and nursing note reviewed.  Constitutional:      General: He is not in acute distress.    Appearance: Normal appearance. He is not ill-appearing, toxic-appearing or diaphoretic.  HENT:     Head: Normocephalic and atraumatic.     Right Ear: External ear normal.     Left Ear: External ear normal.     Nose: Nose normal.     Mouth/Throat:     Mouth: Mucous membranes are moist.     Pharynx: Oropharynx is clear.  Eyes:     General: No scleral icterus.       Right eye: No discharge.        Left eye: No discharge.     Extraocular Movements: Extraocular  movements intact.     Conjunctiva/sclera: Conjunctivae normal.     Pupils: Pupils are equal, round, and reactive to light.  Neck:     Musculoskeletal: Normal range of motion and neck supple.  Cardiovascular:     Rate and Rhythm: Normal rate and regular rhythm.     Pulses: Normal pulses.     Heart sounds: Normal heart sounds. No murmur. No friction rub. No gallop.   Pulmonary:     Effort: Pulmonary effort is normal. No respiratory distress.     Breath sounds: Normal breath sounds. No stridor. No wheezing, rhonchi or rales.  Chest:     Chest wall: No tenderness.  Musculoskeletal: Normal range of motion.  Skin:    General: Skin is warm and dry.     Capillary Refill:  Capillary refill takes less than 2 seconds.     Coloration: Skin is not jaundiced or pale.     Findings: No bruising, erythema, lesion or rash.  Neurological:     General: No focal deficit present.     Mental Status: He is alert and oriented to person, place, and time. Mental status is at baseline.  Psychiatric:        Mood and Affect: Mood normal.        Behavior: Behavior normal.        Thought Content: Thought content normal.        Judgment: Judgment normal.     Results for orders placed or performed in visit on 01/02/19  Microscopic Examination   BLD  Result Value Ref Range   WBC, UA 0-5 0 - 5 /hpf   RBC 3-10 (A) 0 - 2 /hpf   Epithelial Cells (non renal) 0-10 0 - 10 /hpf   Casts Present None seen /lpf   Cast Type Granular casts (A) N/A   Mucus, UA Present Not Estab.   Bacteria, UA None seen None seen/Few  Bayer DCA Hb A1c Waived  Result Value Ref Range   HB A1C (BAYER DCA - WAIVED) 5.7 <7.0 %  CBC with Differential OUT  Result Value Ref Range   WBC 7.9 3.4 - 10.8 x10E3/uL   RBC 2.68 (LL) 4.14 - 5.80 x10E6/uL   Hemoglobin 8.1 (L) 13.0 - 17.7 g/dL   Hematocrit 24.7 (L) 37.5 - 51.0 %   MCV 92 79 - 97 fL   MCH 30.2 26.6 - 33.0 pg   MCHC 32.8 31.5 - 35.7 g/dL   RDW 12.4 11.6 - 15.4 %   Platelets 226 150 - 450 x10E3/uL   Neutrophils 61 Not Estab. %   Lymphs 29 Not Estab. %   Monocytes 7 Not Estab. %   Eos 2 Not Estab. %   Basos 1 Not Estab. %   Neutrophils Absolute 4.8 1.4 - 7.0 x10E3/uL   Lymphocytes Absolute 2.3 0.7 - 3.1 x10E3/uL   Monocytes Absolute 0.6 0.1 - 0.9 x10E3/uL   EOS (ABSOLUTE) 0.2 0.0 - 0.4 x10E3/uL   Basophils Absolute 0.1 0.0 - 0.2 x10E3/uL   Immature Granulocytes 0 Not Estab. %   Immature Grans (Abs) 0.0 0.0 - 0.1 x10E3/uL  Comp Met (CMET)  Result Value Ref Range   Glucose 201 (H) 65 - 99 mg/dL   BUN 72 (H) 8 - 27 mg/dL   Creatinine, Ser 4.10 (H) 0.76 - 1.27 mg/dL   GFR calc non Af Amer 12 (L) >59 mL/min/1.73   GFR calc Af Amer 14 (L) >59  mL/min/1.73   BUN/Creatinine Ratio 18 10 -  24   Sodium 137 134 - 144 mmol/L   Potassium 4.8 3.5 - 5.2 mmol/L   Chloride 98 96 - 106 mmol/L   CO2 22 20 - 29 mmol/L   Calcium 8.6 8.6 - 10.2 mg/dL   Total Protein 8.3 6.0 - 8.5 g/dL   Albumin 3.7 3.6 - 4.6 g/dL   Globulin, Total 4.6 (H) 1.5 - 4.5 g/dL   Albumin/Globulin Ratio 0.8 (L) 1.2 - 2.2   Bilirubin Total 0.3 0.0 - 1.2 mg/dL   Alkaline Phosphatase 87 39 - 117 IU/L   AST 26 0 - 40 IU/L   ALT 15 0 - 44 IU/L  Lipid Panel w/o Chol/HDL Ratio OUT  Result Value Ref Range   Cholesterol, Total 100 100 - 199 mg/dL   Triglycerides 100 0 - 149 mg/dL   HDL 37 (L) >39 mg/dL   VLDL Cholesterol Cal 19 5 - 40 mg/dL   LDL Chol Calc (NIH) 44 0 - 99 mg/dL  Microalbumin, Urine Waived  Result Value Ref Range   Microalb, Ur Waived 150 (H) 0 - 19 mg/L   Creatinine, Urine Waived 100 10 - 300 mg/dL   Microalb/Creat Ratio >300 (H) <30 mg/g  TSH  Result Value Ref Range   TSH 3.380 0.450 - 4.500 uIU/mL  UA/M w/rflx Culture, Routine   Specimen: Blood   BLD  Result Value Ref Range   Specific Gravity, UA 1.025 1.005 - 1.030   pH, UA 5.0 5.0 - 7.5   Color, UA Yellow Yellow   Appearance Ur Clear Clear   Leukocytes,UA Negative Negative   Protein,UA 2+ (A) Negative/Trace   Glucose, UA Negative Negative   Ketones, UA Negative Negative   RBC, UA 2+ (A) Negative   Bilirubin, UA Negative Negative   Urobilinogen, Ur 0.2 0.2 - 1.0 mg/dL   Nitrite, UA Negative Negative   Microscopic Examination See below:       Assessment & Plan:   Problem List Items Addressed This Visit      Cardiovascular and Mediastinum   Benign hypertension with CKD (chronic kidney disease) stage IV (HCC)    BP running high. Not under good control. Checked with his cardiologist and they are OK with increasing his imdur to 14m and recheck 2 weeks. Call with any concerns.       Relevant Medications   ASPIRIN 81 PO   carvedilol (COREG) 12.5 MG tablet   isosorbide mononitrate  (IMDUR) 30 MG 24 hr tablet   Other Relevant Orders   CBC with Differential OUT (Completed)   Comp Met (CMET) (Completed)   Microalbumin, Urine Waived (Completed)   TSH (Completed)   MR Brain Wo Contrast     Endocrine   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 disease [place]    Doing great with a1c of 5.7- has been consistently low for about the past 9 months- we will stop his glipizide and recheck 3 months. Continue jardiance. Continue to monitor.       Relevant Medications   ASPIRIN 81 PO   Other Relevant Orders   Bayer DCA Hb A1c Waived (Completed)   Comp Met (CMET) (Completed)   UA/M w/rflx Culture, Routine (Completed)     Genitourinary   Chronic kidney disease    Continuing to follow monthly with nephrology. Renally adjusting medication. Continue to monitor closely. Call with any concerns.       Relevant Orders   Magnesium   PTH, intact (no Ca)     Other  Mixed hyperlipidemia    Under good control on current regimen. Continue current regimen. Continue to monitor. Call with any concerns. Refills given. Labs drawn today.       Relevant Medications   ASPIRIN 81 PO   carvedilol (COREG) 12.5 MG tablet   isosorbide mononitrate (IMDUR) 30 MG 24 hr tablet   Other Relevant Orders   Comp Met (CMET) (Completed)   Lipid Panel w/o Chol/HDL Ratio OUT (Completed)    Other Visit Diagnoses    Chronic nonintractable headache, unspecified headache type       Given severity of his headache and his co-morbidities, we will check MRI to r/o mass or stroke. No neurologic symptoms. ?migraine.   Relevant Medications   ASPIRIN 81 PO   carvedilol (COREG) 12.5 MG tablet   Other Relevant Orders   MR Brain Wo Contrast       Follow up plan: Return in about 3 months (around 04/04/2019).

## 2019-01-03 LAB — COMPREHENSIVE METABOLIC PANEL
ALT: 15 IU/L (ref 0–44)
AST: 26 IU/L (ref 0–40)
Albumin/Globulin Ratio: 0.8 — ABNORMAL LOW (ref 1.2–2.2)
Albumin: 3.7 g/dL (ref 3.6–4.6)
Alkaline Phosphatase: 87 IU/L (ref 39–117)
BUN/Creatinine Ratio: 18 (ref 10–24)
BUN: 72 mg/dL — ABNORMAL HIGH (ref 8–27)
Bilirubin Total: 0.3 mg/dL (ref 0.0–1.2)
CO2: 22 mmol/L (ref 20–29)
Calcium: 8.6 mg/dL (ref 8.6–10.2)
Chloride: 98 mmol/L (ref 96–106)
Creatinine, Ser: 4.1 mg/dL — ABNORMAL HIGH (ref 0.76–1.27)
GFR calc Af Amer: 14 mL/min/{1.73_m2} — ABNORMAL LOW (ref >59)
GFR calc non Af Amer: 12 mL/min/{1.73_m2} — ABNORMAL LOW (ref >59)
Globulin, Total: 4.6 g/dL — ABNORMAL HIGH (ref 1.5–4.5)
Glucose: 201 mg/dL — ABNORMAL HIGH (ref 65–99)
Potassium: 4.8 mmol/L (ref 3.5–5.2)
Sodium: 137 mmol/L (ref 134–144)
Total Protein: 8.3 g/dL (ref 6.0–8.5)

## 2019-01-03 LAB — CBC WITH DIFFERENTIAL/PLATELET
Basophils Absolute: 0.1 10*3/uL (ref 0.0–0.2)
Basos: 1 %
EOS (ABSOLUTE): 0.2 10*3/uL (ref 0.0–0.4)
Eos: 2 %
Hematocrit: 24.7 % — ABNORMAL LOW (ref 37.5–51.0)
Hemoglobin: 8.1 g/dL — ABNORMAL LOW (ref 13.0–17.7)
Immature Grans (Abs): 0 10*3/uL (ref 0.0–0.1)
Immature Granulocytes: 0 %
Lymphocytes Absolute: 2.3 10*3/uL (ref 0.7–3.1)
Lymphs: 29 %
MCH: 30.2 pg (ref 26.6–33.0)
MCHC: 32.8 g/dL (ref 31.5–35.7)
MCV: 92 fL (ref 79–97)
Monocytes Absolute: 0.6 10*3/uL (ref 0.1–0.9)
Monocytes: 7 %
Neutrophils Absolute: 4.8 10*3/uL (ref 1.4–7.0)
Neutrophils: 61 %
Platelets: 226 10*3/uL (ref 150–450)
RBC: 2.68 x10E6/uL — CL (ref 4.14–5.80)
RDW: 12.4 % (ref 11.6–15.4)
WBC: 7.9 10*3/uL (ref 3.4–10.8)

## 2019-01-03 LAB — LIPID PANEL W/O CHOL/HDL RATIO
Cholesterol, Total: 100 mg/dL (ref 100–199)
HDL: 37 mg/dL — ABNORMAL LOW (ref >39)
LDL Chol Calc (NIH): 44 mg/dL (ref 0–99)
Triglycerides: 100 mg/dL (ref 0–149)
VLDL Cholesterol Cal: 19 mg/dL (ref 5–40)

## 2019-01-03 LAB — TSH: TSH: 3.38 u[IU]/mL (ref 0.450–4.500)

## 2019-01-05 NOTE — Assessment & Plan Note (Signed)
BP running high. Not under good control. Checked with his cardiologist and they are OK with increasing his imdur to 45mg  and recheck 2 weeks. Call with any concerns.

## 2019-01-05 NOTE — Assessment & Plan Note (Signed)
Doing great with a1c of 5.7- has been consistently low for about the past 9 months- we will stop his glipizide and recheck 3 months. Continue jardiance. Continue to monitor.

## 2019-01-05 NOTE — Assessment & Plan Note (Signed)
Under good control on current regimen. Continue current regimen. Continue to monitor. Call with any concerns. Refills given. Labs drawn today.   

## 2019-01-05 NOTE — Assessment & Plan Note (Signed)
Continuing to follow monthly with nephrology. Renally adjusting medication. Continue to monitor closely. Call with any concerns.

## 2019-01-14 DIAGNOSIS — N179 Acute kidney failure, unspecified: Secondary | ICD-10-CM | POA: Diagnosis not present

## 2019-01-14 DIAGNOSIS — N184 Chronic kidney disease, stage 4 (severe): Secondary | ICD-10-CM | POA: Diagnosis not present

## 2019-01-14 DIAGNOSIS — I129 Hypertensive chronic kidney disease with stage 1 through stage 4 chronic kidney disease, or unspecified chronic kidney disease: Secondary | ICD-10-CM | POA: Diagnosis not present

## 2019-01-14 DIAGNOSIS — R600 Localized edema: Secondary | ICD-10-CM | POA: Diagnosis not present

## 2019-01-14 DIAGNOSIS — E1122 Type 2 diabetes mellitus with diabetic chronic kidney disease: Secondary | ICD-10-CM | POA: Diagnosis not present

## 2019-01-14 DIAGNOSIS — N189 Chronic kidney disease, unspecified: Secondary | ICD-10-CM | POA: Diagnosis not present

## 2019-01-17 ENCOUNTER — Ambulatory Visit (INDEPENDENT_AMBULATORY_CARE_PROVIDER_SITE_OTHER): Payer: Medicare Other | Admitting: Family Medicine

## 2019-01-17 ENCOUNTER — Encounter: Payer: Self-pay | Admitting: Family Medicine

## 2019-01-17 ENCOUNTER — Other Ambulatory Visit: Payer: Self-pay

## 2019-01-17 VITALS — BP 129/60 | HR 70

## 2019-01-17 DIAGNOSIS — I129 Hypertensive chronic kidney disease with stage 1 through stage 4 chronic kidney disease, or unspecified chronic kidney disease: Secondary | ICD-10-CM

## 2019-01-17 DIAGNOSIS — I25709 Atherosclerosis of coronary artery bypass graft(s), unspecified, with unspecified angina pectoris: Secondary | ICD-10-CM | POA: Diagnosis not present

## 2019-01-17 DIAGNOSIS — N184 Chronic kidney disease, stage 4 (severe): Secondary | ICD-10-CM

## 2019-01-17 DIAGNOSIS — N186 End stage renal disease: Secondary | ICD-10-CM | POA: Insufficient documentation

## 2019-01-17 MED ORDER — ISOSORBIDE MONONITRATE ER 30 MG PO TB24: 45.0000 mg | ORAL_TABLET | Freq: Every day | ORAL | 1 refills | Status: DC

## 2019-01-17 NOTE — Assessment & Plan Note (Signed)
Doing worse. Following closely with nephrology. Call with any concerns.

## 2019-01-17 NOTE — Assessment & Plan Note (Signed)
Doing much better. Headaches resolved. Continue 45mg  imdur- refills given today. Call with any concerns.

## 2019-01-17 NOTE — Progress Notes (Signed)
BP 129/60   Pulse 70    Subjective:    Patient ID: Ray Lamb, male    DOB: 10-03-1930, 85 y.o.   MRN: [id number]  HPI: Ray Lamb is a 85 y.o. male  Chief Complaint  Patient presents with  . Hypertension   HYPERTENSION- headaches are resolved.  Hypertension status: better  Satisfied with current treatment? no Duration of hypertension: chronic BP monitoring frequency:  a few times a month BP medication side effects:  no Medication compliance: excellent compliance Previous BP meds: metoprolol, imdur, lasix, carvedilol Aspirin: yes Recurrent headaches: no- have resolved Visual changes: no Palpitations: no Dyspnea: no Chest pain: no Lower extremity edema: no Dizzy/lightheaded: no  Relevant past medical, surgical, family and social history reviewed and updated as indicated. Interim medical history since our last visit reviewed. Allergies and medications reviewed and updated.  Review of Systems  Constitutional: Positive for appetite change. Negative for activity change, chills, diaphoresis, fatigue, fever and unexpected weight change.  Respiratory: Negative.   Cardiovascular: Negative.   Gastrointestinal: Negative.   Musculoskeletal: Positive for myalgias. Negative for arthralgias, back pain, gait problem, joint swelling, neck pain and neck stiffness.  Skin: Negative.   Neurological: Positive for weakness. Negative for dizziness, tremors, seizures, syncope, facial asymmetry, speech difficulty, light-headedness, numbness and headaches.  Hematological: Negative.   Psychiatric/Behavioral: Negative.     Per HPI unless specifically indicated above     Objective:    BP 129/60   Pulse 70   Wt Readings from Last 3 Encounters:  01/02/19 148 lb (67.1 kg)  07/01/18 135 lb (61.2 kg)  04/09/18 154 lb 4 oz (70 kg)    Physical Exam Vitals signs and nursing note reviewed.  Pulmonary:     Effort: Pulmonary effort is normal. No respiratory distress.     Comments: Speaking  in full sentences Neurological:     Mental Status: He is alert.  Psychiatric:        Mood and Affect: Mood normal.        Behavior: Behavior normal.        Thought Content: Thought content normal.        Judgment: Judgment normal.     Results for orders placed or performed in visit on 01/02/19  Microscopic Examination   BLD  Result Value Ref Range   WBC, UA 0-5 0 - 5 /hpf   RBC 3-10 (A) 0 - 2 /hpf   Epithelial Cells (non renal) 0-10 0 - 10 /hpf   Casts Present None seen /lpf   Cast Type Granular casts (A) N/A   Mucus, UA Present Not Estab.   Bacteria, UA None seen None seen/Few  Bayer DCA Hb A1c Waived  Result Value Ref Range   HB A1C (BAYER DCA - WAIVED) 5.7 <7.0 %  CBC with Differential OUT  Result Value Ref Range   WBC 7.9 3.4 - 10.8 x10E3/uL   RBC 2.68 (LL) 4.14 - 5.80 x10E6/uL   Hemoglobin 8.1 (L) 13.0 - 17.7 g/dL   Hematocrit 24.7 (L) 37.5 - 51.0 %   MCV 92 79 - 97 fL   MCH 30.2 26.6 - 33.0 pg   MCHC 32.8 31.5 - 35.7 g/dL   RDW 12.4 11.6 - 15.4 %   Platelets 226 150 - 450 x10E3/uL   Neutrophils 61 Not Estab. %   Lymphs 29 Not Estab. %   Monocytes 7 Not Estab. %   Eos 2 Not Estab. %   Basos 1 Not Estab. %  Neutrophils Absolute 4.8 1.4 - 7.0 x10E3/uL   Lymphocytes Absolute 2.3 0.7 - 3.1 x10E3/uL   Monocytes Absolute 0.6 0.1 - 0.9 x10E3/uL   EOS (ABSOLUTE) 0.2 0.0 - 0.4 x10E3/uL   Basophils Absolute 0.1 0.0 - 0.2 x10E3/uL   Immature Granulocytes 0 Not Estab. %   Immature Grans (Abs) 0.0 0.0 - 0.1 x10E3/uL  Comp Met (CMET)  Result Value Ref Range   Glucose 201 (H) 65 - 99 mg/dL   BUN 72 (H) 8 - 27 mg/dL   Creatinine, Ser 4.10 (H) 0.76 - 1.27 mg/dL   GFR calc non Af Amer 12 (L) >59 mL/min/1.73   GFR calc Af Amer 14 (L) >59 mL/min/1.73   BUN/Creatinine Ratio 18 10 - 24   Sodium 137 134 - 144 mmol/L   Potassium 4.8 3.5 - 5.2 mmol/L   Chloride 98 96 - 106 mmol/L   CO2 22 20 - 29 mmol/L   Calcium 8.6 8.6 - 10.2 mg/dL   Total Protein 8.3 6.0 - 8.5 g/dL    Albumin 3.7 3.6 - 4.6 g/dL   Globulin, Total 4.6 (H) 1.5 - 4.5 g/dL   Albumin/Globulin Ratio 0.8 (L) 1.2 - 2.2   Bilirubin Total 0.3 0.0 - 1.2 mg/dL   Alkaline Phosphatase 87 39 - 117 IU/L   AST 26 0 - 40 IU/L   ALT 15 0 - 44 IU/L  Lipid Panel w/o Chol/HDL Ratio OUT  Result Value Ref Range   Cholesterol, Total 100 100 - 199 mg/dL   Triglycerides 100 0 - 149 mg/dL   HDL 37 (L) >39 mg/dL   VLDL Cholesterol Cal 19 5 - 40 mg/dL   LDL Chol Calc (NIH) 44 0 - 99 mg/dL  Microalbumin, Urine Waived  Result Value Ref Range   Microalb, Ur Waived 150 (H) 0 - 19 mg/L   Creatinine, Urine Waived 100 10 - 300 mg/dL   Microalb/Creat Ratio >300 (H) <30 mg/g  TSH  Result Value Ref Range   TSH 3.380 0.450 - 4.500 uIU/mL  UA/M w/rflx Culture, Routine   Specimen: Blood   BLD  Result Value Ref Range   Specific Gravity, UA 1.025 1.005 - 1.030   pH, UA 5.0 5.0 - 7.5   Color, UA Yellow Yellow   Appearance Ur Clear Clear   Leukocytes,UA Negative Negative   Protein,UA 2+ (A) Negative/Trace   Glucose, UA Negative Negative   Ketones, UA Negative Negative   RBC, UA 2+ (A) Negative   Bilirubin, UA Negative Negative   Urobilinogen, Ur 0.2 0.2 - 1.0 mg/dL   Nitrite, UA Negative Negative   Microscopic Examination See below:       Assessment & Plan:   Problem List Items Addressed This Visit      Cardiovascular and Mediastinum   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 Headaches resolved. Continue '45mg'$  imdur- refills given today. Call with any concerns.       Relevant Medications   isosorbide mononitrate (IMDUR) 30 MG 24 hr tablet     Genitourinary   ESRD (end stage renal disease) ([place])    Doing worse. Following closely with nephrology. Call with any concerns.           Follow up plan: Return As scheduled.    . This visit was completed via telephone due to the restrictions of the COVID-19 pandemic. All issues as above were  discussed and addressed but no physical exam was  performed. If it was felt that the patient should be evaluated in the office, they were directed there. The patient verbally consented to this visit. Patient was unable to complete an audio/visual visit due to Lack of equipment. Due to the catastrophic nature of the COVID-19 pandemic, this visit was done through audio contact only. . Location of the patient: home . Location of the provider: work . Those involved with this call:  . Provider: Park Liter, DO . CMA: Tiffany Reel, CMA . Front Desk/Registration: Don Perking  . Time spent on call: 21 minutes on the phone discussing health concerns. 23 minutes total spent in review of patient's record and preparation of their chart.

## 2019-01-18 ENCOUNTER — Ambulatory Visit
Admission: RE | Admit: 2019-01-18 | Discharge: 2019-01-18 | Disposition: A | Discharge status: Home / Self Care | Payer: Medicare Other | Source: Ambulatory Visit | Attending: Family Medicine | Admitting: Family Medicine

## 2019-01-18 DIAGNOSIS — R519 Headache, unspecified: Secondary | ICD-10-CM | POA: Diagnosis not present

## 2019-01-18 DIAGNOSIS — I129 Hypertensive chronic kidney disease with stage 1 through stage 4 chronic kidney disease, or unspecified chronic kidney disease: Secondary | ICD-10-CM | POA: Diagnosis not present

## 2019-01-18 DIAGNOSIS — N184 Chronic kidney disease, stage 4 (severe): Secondary | ICD-10-CM | POA: Diagnosis not present

## 2019-01-18 DIAGNOSIS — G8929 Other chronic pain: Secondary | ICD-10-CM | POA: Insufficient documentation

## 2019-01-21 ENCOUNTER — Telehealth: Payer: Self-pay | Admitting: Family Medicine

## 2019-01-21 MED ORDER — AMOXICILLIN-POT CLAVULANATE 875-125 MG PO TABS: 1.0000 | ORAL_TABLET | Freq: Two times a day (BID) | ORAL | 0 refills | Status: DC

## 2019-01-21 NOTE — Telephone Encounter (Signed)
Called patient, no answer, unable to leave a message, will try again.   

## 2019-01-21 NOTE — Telephone Encounter (Signed)
Patient called back to go over MRI results. Please advise.

## 2019-01-21 NOTE — Telephone Encounter (Signed)
Patient notified of results.

## 2019-01-21 NOTE — Telephone Encounter (Signed)
Please let him know that his MRI was good- no sign of any strokes, but it did show some sinusitis- so I'm sending an antibiotic to his pharmacy to help with that. Thanks!

## 2019-01-22 DIAGNOSIS — D631 Anemia in chronic kidney disease: Secondary | ICD-10-CM | POA: Diagnosis not present

## 2019-01-22 DIAGNOSIS — N184 Chronic kidney disease, stage 4 (severe): Secondary | ICD-10-CM | POA: Diagnosis not present

## 2019-01-24 ENCOUNTER — Inpatient Hospital Stay: Payer: Medicare Other | Attending: Oncology | Admitting: Oncology

## 2019-01-24 ENCOUNTER — Inpatient Hospital Stay: Payer: Medicare Other

## 2019-01-24 ENCOUNTER — Other Ambulatory Visit: Payer: Self-pay | Admitting: Nephrology

## 2019-01-24 ENCOUNTER — Encounter: Payer: Self-pay | Admitting: Oncology

## 2019-01-24 ENCOUNTER — Other Ambulatory Visit: Payer: Self-pay

## 2019-01-24 ENCOUNTER — Ambulatory Visit
Admission: RE | Admit: 2019-01-24 | Discharge: 2019-01-24 | Disposition: A | Discharge status: Home / Self Care | Payer: Medicare Other | Source: Ambulatory Visit | Attending: Oncology | Admitting: Oncology

## 2019-01-24 VITALS — BP 135/62 | HR 66 | Temp 98.7°F | Ht 65.0 in | Wt 156.0 lb

## 2019-01-24 DIAGNOSIS — I129 Hypertensive chronic kidney disease with stage 1 through stage 4 chronic kidney disease, or unspecified chronic kidney disease: Secondary | ICD-10-CM | POA: Diagnosis not present

## 2019-01-24 DIAGNOSIS — Z79899 Other long term (current) drug therapy: Secondary | ICD-10-CM | POA: Insufficient documentation

## 2019-01-24 DIAGNOSIS — E1122 Type 2 diabetes mellitus with diabetic chronic kidney disease: Secondary | ICD-10-CM | POA: Insufficient documentation

## 2019-01-24 DIAGNOSIS — N189 Chronic kidney disease, unspecified: Secondary | ICD-10-CM | POA: Diagnosis not present

## 2019-01-24 DIAGNOSIS — D631 Anemia in chronic kidney disease: Secondary | ICD-10-CM

## 2019-01-24 DIAGNOSIS — D649 Anemia, unspecified: Secondary | ICD-10-CM | POA: Insufficient documentation

## 2019-01-24 DIAGNOSIS — Z87891 Personal history of nicotine dependence: Secondary | ICD-10-CM | POA: Diagnosis not present

## 2019-01-24 DIAGNOSIS — R5383 Other fatigue: Secondary | ICD-10-CM | POA: Insufficient documentation

## 2019-01-24 DIAGNOSIS — R110 Nausea: Secondary | ICD-10-CM | POA: Insufficient documentation

## 2019-01-24 DIAGNOSIS — R600 Localized edema: Secondary | ICD-10-CM | POA: Diagnosis not present

## 2019-01-24 DIAGNOSIS — N179 Acute kidney failure, unspecified: Secondary | ICD-10-CM

## 2019-01-24 DIAGNOSIS — N184 Chronic kidney disease, stage 4 (severe): Secondary | ICD-10-CM

## 2019-01-24 LAB — COMPREHENSIVE METABOLIC PANEL WITH GFR
ALT: 16 U/L (ref 0–44)
AST: 22 U/L (ref 15–41)
Albumin: 2.7 g/dL — ABNORMAL LOW (ref 3.5–5.0)
Alkaline Phosphatase: 74 U/L (ref 38–126)
Anion gap: 11 (ref 5–15)
BUN: 101 mg/dL — ABNORMAL HIGH (ref 8–23)
CO2: 21 mmol/L — ABNORMAL LOW (ref 22–32)
Calcium: 8 mg/dL — ABNORMAL LOW (ref 8.9–10.3)
Chloride: 95 mmol/L — ABNORMAL LOW (ref 98–111)
Creatinine, Ser: 5.04 mg/dL — ABNORMAL HIGH (ref 0.61–1.24)
GFR calc Af Amer: 11 mL/min — ABNORMAL LOW
GFR calc non Af Amer: 10 mL/min — ABNORMAL LOW
Glucose, Bld: 260 mg/dL — ABNORMAL HIGH (ref 70–99)
Potassium: 4.6 mmol/L (ref 3.5–5.1)
Sodium: 127 mmol/L — ABNORMAL LOW (ref 135–145)
Total Bilirubin: 0.7 mg/dL (ref 0.3–1.2)
Total Protein: 7.7 g/dL (ref 6.5–8.1)

## 2019-01-24 LAB — CBC WITH DIFFERENTIAL/PLATELET
Abs Immature Granulocytes: 0.03 10*3/uL (ref 0.00–0.07)
Basophils Absolute: 0.1 10*3/uL (ref 0.0–0.1)
Basophils Relative: 1 %
Eosinophils Absolute: 0.2 10*3/uL (ref 0.0–0.5)
Eosinophils Relative: 2 %
HCT: 19.7 % — ABNORMAL LOW (ref 39.0–52.0)
Hemoglobin: 6.2 g/dL — ABNORMAL LOW (ref 13.0–17.0)
Immature Granulocytes: 0 %
Lymphocytes Relative: 18 %
Lymphs Abs: 1.6 10*3/uL (ref 0.7–4.0)
MCH: 31.2 pg (ref 26.0–34.0)
MCHC: 31.5 g/dL (ref 30.0–36.0)
MCV: 99 fL (ref 80.0–100.0)
Monocytes Absolute: 0.6 10*3/uL (ref 0.1–1.0)
Monocytes Relative: 7 %
Neutro Abs: 6.2 10*3/uL (ref 1.7–7.7)
Neutrophils Relative %: 72 %
Platelets: 249 10*3/uL (ref 150–400)
RBC: 1.99 MIL/uL — ABNORMAL LOW (ref 4.22–5.81)
RDW: 15 % (ref 11.5–15.5)
WBC: 8.6 10*3/uL (ref 4.0–10.5)
nRBC: 0 % (ref 0.0–0.2)

## 2019-01-24 LAB — TSH: TSH: 3.076 u[IU]/mL (ref 0.350–4.500)

## 2019-01-24 LAB — IRON AND TIBC
Iron: 35 ug/dL — ABNORMAL LOW (ref 45–182)
Saturation Ratios: 14 % — ABNORMAL LOW (ref 17.9–39.5)
TIBC: 259 ug/dL (ref 250–450)
UIBC: 224 ug/dL

## 2019-01-24 LAB — PREPARE RBC (CROSSMATCH)

## 2019-01-24 LAB — RETICULOCYTES
Immature Retic Fract: 29.2 % — ABNORMAL HIGH (ref 2.3–15.9)
RBC.: 1.99 MIL/uL — ABNORMAL LOW (ref 4.22–5.81)
Retic Count, Absolute: 84.2 10*3/uL (ref 19.0–186.0)
Retic Ct Pct: 4.2 % — ABNORMAL HIGH (ref 0.4–3.1)

## 2019-01-24 LAB — FOLATE: Folate: 34 ng/mL (ref >5.9)

## 2019-01-24 LAB — VITAMIN B12: Vitamin B-12: 3362 pg/mL — ABNORMAL HIGH (ref 180–914)

## 2019-01-24 LAB — FERRITIN: Ferritin: 80 ng/mL (ref 24–336)

## 2019-01-24 MED ORDER — ACETAMINOPHEN 325 MG PO TABS
ORAL_TABLET | ORAL | Status: AC
  Administered 2019-01-24: 650 mg via ORAL
  Filled 2019-01-24: qty 2

## 2019-01-24 MED ORDER — ACETAMINOPHEN 325 MG PO TABS: 650.0000 mg | ORAL_TABLET | Freq: Once | ORAL | Status: AC

## 2019-01-24 MED ORDER — SODIUM CHLORIDE FLUSH 0.9 % IV SOLN
INTRAVENOUS | Status: AC
  Filled 2019-01-24: qty 10

## 2019-01-24 MED ORDER — HEPARIN SOD (PORK) LOCK FLUSH 100 UNIT/ML IV SOLN: 500.0000 [IU] | Freq: Every day | INTRAVENOUS | Status: DC | PRN

## 2019-01-24 MED ORDER — SODIUM CHLORIDE 0.9% IV SOLUTION
250.0000 mL | Freq: Once | INTRAVENOUS | Status: AC
  Administered 2019-01-24: 20 mL/h via INTRAVENOUS

## 2019-01-24 MED ORDER — SODIUM CHLORIDE 0.9% FLUSH: 10.0000 mL | INTRAVENOUS | Status: DC | PRN

## 2019-01-24 MED ORDER — SODIUM CHLORIDE 0.9% FLUSH: 3.0000 mL | INTRAVENOUS | Status: DC | PRN

## 2019-01-24 MED ORDER — HEPARIN SOD (PORK) LOCK FLUSH 100 UNIT/ML IV SOLN: 250.0000 [IU] | INTRAVENOUS | Status: DC | PRN

## 2019-01-24 NOTE — Progress Notes (Signed)
Patient is here today to establish care. Patient stated that he had been nauseated and vomited and that for the past two days, he had felt better and that it has resolved. Patient denied fever, chills, nausea, vomiting, diarrhea or constipation.

## 2019-01-24 NOTE — Progress Notes (Signed)
Placed orders for Hep B and Quantiferon Can be drawn when patient presents for Blood transfusion today

## 2019-01-25 ENCOUNTER — Other Ambulatory Visit: Payer: Medicare Other

## 2019-01-25 LAB — HAPTOGLOBIN: Haptoglobin: 186 mg/dL (ref 38–329)

## 2019-01-27 ENCOUNTER — Encounter: Payer: Self-pay | Admitting: Oncology

## 2019-01-27 DIAGNOSIS — D631 Anemia in chronic kidney disease: Secondary | ICD-10-CM | POA: Insufficient documentation

## 2019-01-27 LAB — TYPE AND SCREEN
ABO/RH(D): A POS
Antibody Screen: NEGATIVE
PT AG Type: POSITIVE
Unit division: 0

## 2019-01-27 LAB — KAPPA/LAMBDA LIGHT CHAINS
Kappa free light chain: 375 mg/L — ABNORMAL HIGH (ref 3.3–19.4)
Kappa, lambda light chain ratio: 1.48 (ref 0.26–1.65)
Lambda free light chains: 253.7 mg/L — ABNORMAL HIGH (ref 5.7–26.3)

## 2019-01-27 LAB — BPAM RBC
Blood Product Expiration Date: 202101112359
ISSUE DATE / TIME: 202012111333
Unit Type and Rh: 5100

## 2019-01-27 NOTE — Progress Notes (Signed)
Hematology/Oncology Consult note [facility] Telephone:([phone] [phone]  Patient Care Team: [name], MD as PCP - General (Family Medicine) [name], MD as PCP - Family Medicine (Family Medicine) Murlean Iba, MD (Internal Medicine) Dionisio David, MD as Consulting Physician (Cardiology) Sharlotte Alamo, DPM (Podiatry) Ronita Hipps Elmer Ramp., MD (Dentistry) Lucilla Lame, MD as Consulting Physician (Gastroenterology) Vladimir Crofts, MD as Consulting Physician (Neurology) Minor, Dalbert Garnet, RN as [place] Management   Name of the patient: Ray Lamb  [id number]  1930/09/20    Reason for referral-anemia of chronic kidney disease   Referring physician-Dr. Murlean Iba  Date of visit: 01/27/19   History of presenting illness- Patient is a 85 year old male with a past medical history significant for hypertension hyperlipidemia and diabetes.  He also has chronic kidney disease for which he sees Dr. Candiss Norse.  His renal functions have been getting worse and his baseline creatinine up until May to June 2020 was about 2.4-2.9.  On 01/22/2019 his creatinine had gone up to 4.5.  Around the same time his hemoglobin also decreased from his baseline between 9 to 10-6.7 on 01/22/2019.  Patient reports increasing bilateral lower extremity swelling and ongoing nausea especially for the last few days.  He reports significant fatigue and difficulty getting around the house.  He is yet to start dialysis  ECOG PS- 2  Pain scale- 0   Review of systems- Review of Systems  Constitutional: Positive for malaise/fatigue. Negative for chills, fever and weight loss.  HENT: Negative for congestion, ear discharge and nosebleeds.   Eyes: Negative for blurred vision.  Respiratory: Negative for cough, hemoptysis, sputum production, shortness of breath and wheezing.   Cardiovascular: Positive for leg swelling. Negative for chest pain, palpitations,  orthopnea and claudication.  Gastrointestinal: Positive for nausea. Negative for abdominal pain, blood in stool, constipation, diarrhea, heartburn, melena and vomiting.  Genitourinary: Negative for dysuria, flank pain, frequency, hematuria and urgency.  Musculoskeletal: Negative for back pain, joint pain and myalgias.  Skin: Negative for rash.  Neurological: Negative for dizziness, tingling, focal weakness, seizures, weakness and headaches.  Endo/Heme/Allergies: Does not bruise/bleed easily.  Psychiatric/Behavioral: Negative for depression and suicidal ideas. The patient does not have insomnia.     Allergies  Allergen Reactions  . Bee Venom Hives  . Iodinated Diagnostic Agents Rash  . Metrizamide Rash  . Other Rash and Other (See Comments)    Dye    Patient Active Problem List   Diagnosis Date Noted  . ESRD (end stage renal disease) ([place]) 01/17/2019  . 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 Insomnia 03/14/2018  . Angina pectoris ([place]) 12/05/2017  . History of non-ST elevation myocardial infarction (NSTEMI)   . Palliative care by specialist   . CAD (coronary artery disease) 09/04/2017  . Chronic kidney disease 09/04/2017  . Pulmonary HTN ([place]) 09/04/2017  . Hiatal hernia 09/04/2017  . Mixed hyperlipidemia 09/04/2017  . Benign hypertension with CKD (chronic kidney disease) stage IV ([place]) 09/04/2017  . Mitral insufficiency 06/05/2017  . Alzheimer's dementia ([place]) 12/11/2016  . Advance care planning 05/03/2016  . Gastritis without bleeding   . Duodenitis   . Senile purpura ([place]) 12/03/2014  . 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 disease ([place]) 09/03/2014     Past Medical History:  Diagnosis Date  . Anemia   . Chronic kidney disease    STAGE 4  . Coronary atherosclerosis    CABG  X 4  . Diabetes mellitus without complication ([place])   . Edema    ankle  . GERD (gastroesophageal reflux disease)   . Glaucoma   .  Hearing loss   . Heart murmur   . History of hiatal hernia   . Hyperlipidemia   . Hypertension   . Mini stroke ([place])   . Mitral insufficiency   . Myocardial infarction ([place])    mild, heart cath done no stents needed  . Stroke [facility]) 2011   TIA     Past Surgical History:  Procedure Laterality Date  . CATARACT EXTRACTION W/PHACO Left 09/05/2016   Procedure: CATARACT EXTRACTION PHACO AND INTRAOCULAR LENS PLACEMENT (IOC);  Surgeon: Birder Robson, MD;  Location: [facility] ORS;  Service: Ophthalmology;  Laterality: Left;  [place] 00:43.1 AP% 22.6 CDE 9.77 Fluid Pack lot #[id number]  . CATARACT EXTRACTION W/PHACO Right 09/26/2016   Procedure: CATARACT EXTRACTION PHACO AND INTRAOCULAR LENS PLACEMENT ([place]);  Surgeon: Birder Robson, MD;  Location: [facility] ORS;  Service: Ophthalmology;  Laterality: Right;  [place] 01:00 AP% 16.0 CDE 9.71 Fluid pack lot # [id number]  . CORONARY ARTERY BYPASS GRAFT  2003  . DIALYSIS/PERMA CATHETER INSERTION N/A 10/04/2017   Procedure: DIALYSIS/PERMA CATHETER INSERTION;  Surgeon: Algernon Huxley, MD;  Location: [place] CV LAB;  Service: Cardiovascular;  Laterality: N/A;  . DIALYSIS/PERMA CATHETER REMOVAL N/A 11/01/2017   Procedure: DIALYSIS/PERMA CATHETER REMOVAL;  Surgeon: Algernon Huxley, MD;  Location: [place] CV LAB;  Service: Cardiovascular;  Laterality: N/A;  . ESOPHAGOGASTRODUODENOSCOPY (EGD) WITH PROPOFOL N/A 04/25/2016   Procedure: ESOPHAGOGASTRODUODENOSCOPY (EGD) WITH PROPOFOL;  Surgeon: Lucilla Lame, MD;  Location: [facility] ENDOSCOPY;  Service: Endoscopy;  Laterality: N/A;  . JOINT REPLACEMENT Right 2010   knee  . LEFT HEART CATH AND CORONARY ANGIOGRAPHY N/A 10/04/2017   Procedure: LEFT HEART CATH AND CORONARY ANGIOGRAPHY;  Surgeon: Isaias Cowman, MD;  Location: [place] CV LAB;  Service: Cardiovascular;  Laterality: N/A;  . LEFT HEART CATH AND CORONARY ANGIOGRAPHY Left 07/01/2018   Procedure: LEFT HEART CATH AND CORONARY ANGIOGRAPHY;  Surgeon: Dionisio David, MD;  Location: [place] CV LAB;  Service: Cardiovascular;  Laterality: Left;  . SKIN CANCER EXCISION      Social History   Socioeconomic History  . Marital status: Married    Spouse name: Not on file  . Number of children: Not on file  . Years of education: Not on file  . Highest education level: Not on file  Occupational History  . Not on file  Tobacco Use  . Smoking status: Former Smoker    Quit date: 09/02/1964    Years since quitting: 54.4  . Smokeless tobacco: Never Used  Substance and Sexual Activity  . Alcohol use: No    Alcohol/week: 0.0 standard drinks  . Drug use: No  . Sexual activity: Not on file  Other Topics Concern  . Not on file  Social History Narrative  . Not on file   Social Determinants of Health   Financial Resource Strain: Low Risk   . Difficulty of Paying Living Expenses: Not hard at all  Food Insecurity: No Food Insecurity  . Worried About Charity fundraiser in the Last Year: Never true  . Ran Out of Food in the Last Year: Never true  Transportation Needs: No Transportation Needs  . Lack of Transportation (Medical): No  . Lack of Transportation (Non-Medical): No  Physical Activity: Insufficiently Active  . Days of Exercise per Week: 2 days  .  Minutes of Exercise per Session: 60 min  Stress: No Stress Concern Present  . Feeling of Stress : Not at all  Social Connections: Slightly Isolated  . Frequency of Communication with Friends and Family: More than three times a week  . Frequency of Social Gatherings with Friends and Family: More than three times a week  . Attends Religious Services: More than 4 times per year  . Active Member of Clubs or Organizations: No  . Attends Archivist Meetings: Never  . Marital Status: Married  Human resources officer Violence: Not At Risk  . Fear of Current or Ex-Partner: No  . Emotionally Abused: No  . Physically Abused: No  . Sexually Abused: No     Family History  Problem Relation  Age of Onset  . Stroke Mother   . Diabetes Mother   . Diabetes Sister   . Diabetes Maternal Grandmother      Current Outpatient Medications:  .  amoxicillin-clavulanate (AUGMENTIN) 875-125 MG tablet, Take 1 tablet by mouth 2 (two) times daily., Disp: 20 tablet, Rfl: 0 .  ASPIRIN 81 PO, Take by mouth daily., Disp: , Rfl:  .  atorvastatin (LIPITOR) 80 MG tablet, Take 1 tablet (80 mg total) by mouth at bedtime., Disp: 90 tablet, Rfl: 4 .  carvedilol (COREG) 12.5 MG tablet, Take by mouth 2 (two) times daily. , Disp: , Rfl:  .  clopidogrel (PLAVIX) 75 MG tablet, TAKE 1 TABLET DAILY, Disp: 90 tablet, Rfl: 3 .  ezetimibe (ZETIA) 10 MG tablet, Take 1 tablet (10 mg total) by mouth daily., Disp: 90 tablet, Rfl: 4 .  furosemide (LASIX) 40 MG tablet, Take 40 mg by mouth daily as needed for fluid. , Disp: , Rfl:  .  isosorbide mononitrate (IMDUR) 30 MG 24 hr tablet, Take 1.5 tablets (45 mg total) by mouth daily., Disp: 135 tablet, Rfl: 1 .  JANUVIA 25 MG tablet, TAKE 1 TABLET BY MOUTH EVERY DAY, Disp: 90 tablet, Rfl: 1 .  Loratadine (CLARITIN PO), Take by mouth daily., Disp: , Rfl:  .  metoprolol tartrate (LOPRESSOR) 50 MG tablet, Take 1 tablet (50 mg total) by mouth every 12 (twelve) hours., Disp: , Rfl:  .  Multiple Vitamins-Minerals (CENTRUM SILVER ULTRA MENS PO), Take by mouth daily., Disp: , Rfl:  .  sodium bicarbonate 650 MG tablet, Take by mouth., Disp: , Rfl:  .  timolol (TIMOPTIC) 0.5 % ophthalmic solution, Place 1 drop into both eyes daily. , Disp: , Rfl:  .  TURMERIC PO, Take 1,300 mg by mouth daily. , Disp: , Rfl:  .  donepezil (ARICEPT) 10 MG tablet, Take by mouth daily. , Disp: , Rfl:  No current facility-administered medications for this visit.  Facility-Administered Medications Ordered in Other Visits:  .  sodium chloride flush (NS) 0.9 % injection 3 mL, 3 mL, Intravenous, Q12H, Jake Bathe, FNP   Physical exam:  Vitals:   01/24/19 1140  BP: 135/62  Pulse: 66  Temp:  98.7 F (37.1 C)  TempSrc: Tympanic  Weight: 156 lb (70.8 kg)  Height: _0  (1.651 m)   Physical Exam Constitutional:      Comments: Elderly gentleman sitting in a wheelchair.  Appears fatigued  HENT:     Head: Normocephalic and atraumatic.  Eyes:     Pupils: Pupils are equal, round, and reactive to light.  Cardiovascular:     Rate and Rhythm: Normal rate and regular rhythm.     Heart sounds: Normal heart sounds.  Pulmonary:  Effort: Pulmonary effort is normal.     Breath sounds: Normal breath sounds.  Abdominal:     General: Bowel sounds are normal.     Palpations: Abdomen is soft.  Musculoskeletal:     Cervical back: Normal range of motion.     Right lower leg: Edema present.     Left lower leg: Edema present.  Skin:    General: Skin is warm and dry.  Neurological:     Mental Status: He is alert and oriented to person, place, and time.        CMP Latest Ref Rng & Units 01/24/2019  Glucose 70 - 99 mg/dL 260(H)  BUN 8 - 23 mg/dL 101(H)  Creatinine 0.61 - 1.24 mg/dL 5.04(H)  Sodium 135 - 145 mmol/L 127(L)  Potassium 3.5 - 5.1 mmol/L 4.6  Chloride 98 - 111 mmol/L 95(L)  CO2 22 - 32 mmol/L 21(L)  Calcium 8.9 - 10.3 mg/dL 8.0(L)  Total Protein 6.5 - 8.1 g/dL 7.7  Total Bilirubin 0.3 - 1.2 mg/dL 0.7  Alkaline Phos 38 - 126 U/L 74  AST 15 - 41 U/L 22  ALT 0 - 44 U/L 16   CBC Latest Ref Rng & Units 01/24/2019  WBC 4.0 - 10.5 K/uL 8.6  Hemoglobin 13.0 - 17.0 g/dL 6.2(L)  Hematocrit 39.0 - 52.0 % 19.7(L)  Platelets 150 - 400 K/uL 249    No images are attached to the encounter.  MR Brain Wo Contrast  Result Date: 01/19/2019 CLINICAL DATA:  Left frontal headaches over the last 2 months. Normal neurological exam. EXAM: MRI HEAD WITHOUT CONTRAST TECHNIQUE: Multiplanar, multiecho pulse sequences of the brain and surrounding structures were obtained without intravenous contrast. COMPARISON:  04/03/2016 FINDINGS: Brain: Diffusion imaging does not show any acute or  subacute infarction. Generalized age related volume loss as seen previously. No focal insult affecting the brainstem or cerebellum. Cerebral hemispheres show mild chronic small-vessel change of the deep white matter considering age. No cortical or large vessel territory infarction. No mass lesion, hemorrhage, hydrocephalus or extra-axial collection. Vascular: Major vessels at the base of the brain show flow. Skull and upper cervical spine: Negative. Calvarial defect at the vertex as seen previously. Sinuses/Orbits: Complete opacification of the left division of the sphenoid sinus which could be a cause of headache. This material shows low T2 signal, which could be consistent with inspissated mucus or chronic fungal sinusitis. The other sinuses are clear. Orbits are negative. Other: None IMPRESSION: Generalized brain atrophy. Mild chronic small-vessel ischemic change of the hemispheric white matter. Similar appearance to the previous study. Opacification of the left division of the sphenoid sinus. The sinus material shows low signal on T2 imaging, suggesting either inspissated mucus or chronic fungal sinusitis. This could certainly be a cause of headache. Electronically Signed   By: Nelson Chimes M.D.   On: 01/19/2019 13:08    Assessment and plan- Patient is a 85 y.o. male referred for anemia of chronic kidney disease  Patient has subacute worsening of his kidney disease from his baseline creatinine of 2.7 to 5.04 today.  He also has worsening bilateral lower extremity swelling, fatigue and nausea which may be early uremic signs.  I personally spoke with Dr. Candiss Norse on 01/24/2019 over the phone.  Dr. Candiss Norse and he plans to see the patient on 01/27/2019 to discuss initiation of dialysis in the future.  Normocytic anemia: I will do a complete anemia work-up including CBC with differential, CMP, ferritin and iron studies, B12 and folate, reticulocyte  count, haptoglobin, multiple myeloma panel, TSH and serum free  light chains.  He will be getting 1 unit of PRBC at 1 day surgery today.  If his ferritin is less than 200 and iron saturation less than 20% I will plan to give him 5 doses of Venofer and see if we can improve his anemia without having to initiate EPO.  If his anemia does not improve despite giving him IV iron, I will initiate EPO at that time.  If patient begins dialysis he will be receiving IV iron and EPO through dialysis with nephrology.  We will plan to give him Feraheme 500 mg 2 doses once a week IV.  Discussed risks and benefits of Feraheme including all but not limited to headache, leg swelling, possible risk of infusion reaction.  Patient understands and agrees to proceed as planned  I will see him back in 6 to 8 weeks time for a video visit with labs prior     Thank you for this kind referral and the opportunity to participate in the care of this patient   Visit Diagnosis 1. Anemia of chronic renal failure, unspecified CKD stage   2. Normocytic anemia     Dr. Randa Evens, MD, MPH [facility] at [employer] [facility] [phone] 01/27/2019  9:44 AM

## 2019-01-29 ENCOUNTER — Inpatient Hospital Stay: Payer: Medicare Other

## 2019-01-29 ENCOUNTER — Other Ambulatory Visit: Payer: Self-pay

## 2019-01-29 VITALS — BP 124/62 | HR 66 | Resp 18

## 2019-01-29 DIAGNOSIS — N189 Chronic kidney disease, unspecified: Secondary | ICD-10-CM

## 2019-01-29 DIAGNOSIS — D631 Anemia in chronic kidney disease: Secondary | ICD-10-CM

## 2019-01-29 LAB — MULTIPLE MYELOMA PANEL, SERUM
Albumin SerPl Elph-Mcnc: 2.9 g/dL (ref 2.9–4.4)
Albumin/Glob SerPl: 0.7 (ref 0.7–1.7)
Alpha 1: 0.2 g/dL (ref 0.0–0.4)
Alpha2 Glob SerPl Elph-Mcnc: 0.8 g/dL (ref 0.4–1.0)
B-Globulin SerPl Elph-Mcnc: 1 g/dL (ref 0.7–1.3)
Gamma Glob SerPl Elph-Mcnc: 2.3 g/dL — ABNORMAL HIGH (ref 0.4–1.8)
Globulin, Total: 4.4 g/dL — ABNORMAL HIGH (ref 2.2–3.9)
IgA: 544 mg/dL — ABNORMAL HIGH (ref 61–437)
IgG (Immunoglobin G), Serum: 2530 mg/dL — ABNORMAL HIGH (ref 603–1613)
IgM (Immunoglobulin M), Srm: 220 mg/dL — ABNORMAL HIGH (ref 15–143)
M Protein SerPl Elph-Mcnc: 0.5 g/dL — ABNORMAL HIGH
Total Protein ELP: 7.3 g/dL (ref 6.0–8.5)

## 2019-01-29 MED ORDER — SODIUM CHLORIDE 0.9 % IV SOLN
510.0000 mg | Freq: Once | INTRAVENOUS | Status: AC
  Administered 2019-01-29: 510 mg via INTRAVENOUS
  Filled 2019-01-29: qty 510

## 2019-01-29 MED ORDER — SODIUM CHLORIDE 0.9 % IV SOLN
Freq: Once | INTRAVENOUS | Status: AC
  Filled 2019-01-29: qty 250

## 2019-02-04 ENCOUNTER — Inpatient Hospital Stay: Payer: Medicare Other

## 2019-02-04 ENCOUNTER — Other Ambulatory Visit: Payer: Self-pay

## 2019-02-05 ENCOUNTER — Other Ambulatory Visit: Payer: Self-pay

## 2019-02-05 ENCOUNTER — Inpatient Hospital Stay: Payer: Medicare Other

## 2019-02-05 VITALS — BP 113/69 | HR 61 | Temp 96.4°F | Resp 18

## 2019-02-05 DIAGNOSIS — N189 Chronic kidney disease, unspecified: Secondary | ICD-10-CM | POA: Diagnosis not present

## 2019-02-05 DIAGNOSIS — D631 Anemia in chronic kidney disease: Secondary | ICD-10-CM

## 2019-02-05 MED ORDER — SODIUM CHLORIDE 0.9 % IV SOLN
510.0000 mg | Freq: Once | INTRAVENOUS | Status: AC
  Administered 2019-02-05: 510 mg via INTRAVENOUS
  Filled 2019-02-05: qty 510

## 2019-02-05 MED ORDER — SODIUM CHLORIDE 0.9 % IV SOLN
Freq: Once | INTRAVENOUS | Status: AC
  Filled 2019-02-05: qty 250

## 2019-02-11 ENCOUNTER — Inpatient Hospital Stay: Payer: Medicare Other

## 2019-02-17 ENCOUNTER — Ambulatory Visit: Payer: Medicare Other

## 2019-02-17 DIAGNOSIS — N185 Chronic kidney disease, stage 5: Secondary | ICD-10-CM | POA: Diagnosis not present

## 2019-02-17 DIAGNOSIS — I129 Hypertensive chronic kidney disease with stage 1 through stage 4 chronic kidney disease, or unspecified chronic kidney disease: Secondary | ICD-10-CM | POA: Diagnosis not present

## 2019-02-17 DIAGNOSIS — R600 Localized edema: Secondary | ICD-10-CM | POA: Diagnosis not present

## 2019-02-17 DIAGNOSIS — N189 Chronic kidney disease, unspecified: Secondary | ICD-10-CM | POA: Diagnosis not present

## 2019-02-17 DIAGNOSIS — E871 Hypo-osmolality and hyponatremia: Secondary | ICD-10-CM | POA: Diagnosis not present

## 2019-02-17 DIAGNOSIS — E1122 Type 2 diabetes mellitus with diabetic chronic kidney disease: Secondary | ICD-10-CM | POA: Diagnosis not present

## 2019-02-20 ENCOUNTER — Ambulatory Visit (INDEPENDENT_AMBULATORY_CARE_PROVIDER_SITE_OTHER): Payer: Medicare Other

## 2019-02-20 VITALS — BP 118/66 | Ht 62.0 in | Wt 145.0 lb

## 2019-02-20 DIAGNOSIS — Z0000 Encounter for general adult medical examination without abnormal findings: Secondary | ICD-10-CM | POA: Diagnosis not present

## 2019-02-20 NOTE — Progress Notes (Signed)
Subjective:   Ray Lamb is a 86 y.o. male who presents for Medicare Annual/Subsequent preventive examination.  This visit is being conducted via phone call  - after an attmept to do on video chat - due to the COVID-19 pandemic. This patient has given me verbal consent via phone to conduct this visit, patient states they are participating from their home address. Some vital signs may be absent or patient reported.   Patient identification: identified by name, DOB, and current address.    Review of Systems:  Cardiac Risk Factors include: advanced age (>55men, >79 women);male gender;dyslipidemia;hypertension     Objective:    Vitals: BP 118/66   Ht 5\' 2"  (1.575 m)   Wt 145 lb (65.8 kg)   BMI 26.52 kg/m   Body mass index is 26.52 kg/m.  Advanced Directives 02/20/2019 01/24/2019 07/01/2018 02/14/2018 11/19/2017 10/31/2017 10/23/2017  Does Patient Have a Medical Advance Directive? No No No No No No No  Would patient like information on creating a medical advance directive? - No - Patient declined No - Patient declined Yes (MAU/Ambulatory/Procedural Areas - Information given) No - Patient declined No - Patient declined No - Patient declined    Tobacco Social History   Tobacco Use  Smoking Status Former Smoker  . Quit date: 09/02/1964  . Years since quitting: 54.5  Smokeless Tobacco Never Used     Counseling given: Not Answered   Clinical Intake:  Pre-visit preparation completed: Yes  Pain : No/denies pain Pain Score: 0-No pain     Nutritional Status: BMI 25 -29 Overweight Nutritional Risks: None Diabetes: Yes CBG done?: No Did pt. bring in CBG monitor from home?: No  How often do you need to have someone help you when you read instructions, pamphlets, or other written materials from your doctor or pharmacy?: 1 - Never  Nutrition Risk Assessment:  Has the patient had any N/V/D within the last 2 months?  No  Does the patient have any non-healing wounds?  No  Has the  patient had any unintentional weight loss or weight gain?  No   Diabetes:  Is the patient diabetic?  Yes  If diabetic, was a CBG obtained today?  No  Did the patient bring in their glucometer from home?  No  How often do you monitor your CBG's? As needed  Financial Strains and Diabetes Management:  Are you having any financial strains with the device, your supplies or your medication? No .  Does the patient want to be seen by Chronic Care Management for management of their diabetes?  No  Would the patient like to be referred to a Nutritionist or for Diabetic Management?  No   Diabetic Exams:  Diabetic Eye Exam: Goes to Dr.Porfilio, scheduled.  Diabetic Foot Exam: due now   Interpreter Needed?: No   Information entered by :: Azaryah Oleksy,LPN  Past Medical History:  Diagnosis Date  . Anemia   . Chronic kidney disease    STAGE 4  . Coronary atherosclerosis    CABG X 4  . Diabetes mellitus without complication ([place])   . Edema    ankle  . GERD (gastroesophageal reflux disease)   . Glaucoma   . Hearing loss   . Heart murmur   . History of hiatal hernia   . Hyperlipidemia   . Hypertension   . Mini stroke ([place])   . Mitral insufficiency   . Myocardial infarction ([place])    mild, heart cath done no stents needed  .  Stroke [facility]) 2011   TIA   Past Surgical History:  Procedure Laterality Date  . CATARACT EXTRACTION W/PHACO Left 09/05/2016   Procedure: CATARACT EXTRACTION PHACO AND INTRAOCULAR LENS PLACEMENT (IOC);  Surgeon: Birder Robson, MD;  Location: [facility] ORS;  Service: Ophthalmology;  Laterality: Left;  [place] 00:43.1 AP% 22.6 CDE 9.77 Fluid Pack lot #[id number]  . CATARACT EXTRACTION W/PHACO Right 09/26/2016   Procedure: CATARACT EXTRACTION PHACO AND INTRAOCULAR LENS PLACEMENT ([place]);  Surgeon: Birder Robson, MD;  Location: [facility] ORS;  Service: Ophthalmology;  Laterality: Right;  [place] 01:00 AP% 16.0 CDE 9.71 Fluid pack lot # [id number]  . CORONARY ARTERY BYPASS GRAFT   2003  . DIALYSIS/PERMA CATHETER INSERTION N/A 10/04/2017   Procedure: DIALYSIS/PERMA CATHETER INSERTION;  Surgeon: Algernon Huxley, MD;  Location: [place] [facility];  Service: Cardiovascular;  Laterality: N/A;  . DIALYSIS/PERMA CATHETER REMOVAL N/A 11/01/2017   Procedure: DIALYSIS/PERMA CATHETER REMOVAL;  Surgeon: Algernon Huxley, MD;  Location: [place] [facility];  Service: Cardiovascular;  Laterality: N/A;  . ESOPHAGOGASTRODUODENOSCOPY (EGD) WITH PROPOFOL N/A 04/25/2016   Procedure: ESOPHAGOGASTRODUODENOSCOPY (EGD) WITH PROPOFOL;  Surgeon: Lucilla Lame, MD;  Location: [facility] ENDOSCOPY;  Service: Endoscopy;  Laterality: N/A;  . JOINT REPLACEMENT Right 2010   knee  . LEFT HEART CATH AND CORONARY ANGIOGRAPHY N/A 10/04/2017   Procedure: LEFT HEART CATH AND CORONARY ANGIOGRAPHY;  Surgeon: Isaias Cowman, MD;  Location: [place] [facility];  Service: Cardiovascular;  Laterality: N/A;  . LEFT HEART CATH AND CORONARY ANGIOGRAPHY Left 07/01/2018   Procedure: LEFT HEART CATH AND CORONARY ANGIOGRAPHY;  Surgeon: Dionisio David, MD;  Location: [place] [facility];  Service: Cardiovascular;  Laterality: Left;  . SKIN CANCER EXCISION     Family History  Problem Relation Age of Onset  . Stroke Mother   . Diabetes Mother   . Diabetes Sister   . Diabetes Maternal Grandmother    Social History   Socioeconomic History  . Marital status: Married    Spouse name: Not on file  . Number of children: Not on file  . Years of education: Not on file  . Highest education level: Not on file  Occupational History  . Not on file  Tobacco Use  . Smoking status: Former Smoker    Quit date: 09/02/1964    Years since quitting: 54.5  . Smokeless tobacco: Never Used  Substance and Sexual Activity  . Alcohol use: No    Alcohol/week: 0.0 standard drinks  . Drug use: No  . Sexual activity: Not on file  Other Topics Concern  . Not on file  Social History Narrative  . Not on file   Social Determinants of Health     Financial Resource Strain:   . Difficulty of Paying Living Expenses: Not on file  Food Insecurity:   . Worried About Charity fundraiser in the Last Year: Not on file  . Ran Out of Food in the Last Year: Not on file  Transportation Needs:   . Lack of Transportation (Medical): Not on file  . Lack of Transportation (Non-Medical): Not on file  Physical Activity:   . Days of Exercise per Week: Not on file  . Minutes of Exercise per Session: Not on file  Stress:   . Feeling of Stress : Not on file  Social Connections:   . Frequency of Communication with Friends and Family: Not on file  . Frequency of Social Gatherings with Friends and Family: Not on file  . Attends Religious  Services: Not on file  . Active Member of Clubs or Organizations: Not on file  . Attends Archivist Meetings: Not on file  . Marital Status: Not on file    Outpatient Encounter Medications as of 02/20/2019  Medication Sig  . ASPIRIN 81 PO Take by mouth daily.  Marland Kitchen atorvastatin (LIPITOR) 80 MG tablet Take 1 tablet (80 mg total) by mouth at bedtime.  . carvedilol (COREG) 12.5 MG tablet Take by mouth 2 (two) times daily.   . clopidogrel (PLAVIX) 75 MG tablet TAKE 1 TABLET DAILY  . ezetimibe (ZETIA) 10 MG tablet Take 1 tablet (10 mg total) by mouth daily.  . furosemide (LASIX) 40 MG tablet Take 40 mg by mouth daily as needed for fluid.   . isosorbide mononitrate (IMDUR) 30 MG 24 hr tablet Take 1.5 tablets (45 mg total) by mouth daily.  Marland Kitchen JANUVIA 25 MG tablet TAKE 1 TABLET BY MOUTH EVERY DAY  . metoprolol tartrate (LOPRESSOR) 50 MG tablet Take 1 tablet (50 mg total) by mouth every 12 (twelve) hours.  . mirtazapine (REMERON) 7.5 MG tablet Take 7.5 mg by mouth at bedtime.  . Multiple Vitamins-Minerals (CENTRUM SILVER ULTRA MENS PO) Take by mouth daily.  . timolol (TIMOPTIC) 0.5 % ophthalmic solution Place 1 drop into both eyes daily.   . TURMERIC PO Take 1,300 mg by mouth daily.   Marland Kitchen donepezil (ARICEPT) 10 MG  tablet Take by mouth daily.   . Loratadine (CLARITIN PO) Take by mouth daily.  . sodium bicarbonate 650 MG tablet Take by mouth.  . [DISCONTINUED] amoxicillin-clavulanate (AUGMENTIN) 875-125 MG tablet Take 1 tablet by mouth 2 (two) times daily. (Patient not taking: Reported on 02/20/2019)   Facility-Administered Encounter Medications as of 02/20/2019  Medication  . sodium chloride flush (NS) 0.9 % injection 3 mL    Activities of Daily Living In your present state of health, do you have any difficulty performing the following activities: 02/20/2019 03/14/2018  Hearing? Y N  Comment hearing aids -  Vision? N N  Comment eyeglasses, Dr.Profilio -  Difficulty concentrating or making decisions? Y Y  Comment on aricept -  Walking or climbing stairs? Y Y  Comment doesnt use them Holds onto handles  Dressing or bathing? Y N  Comment doing bird baths, having hard time getting in shower -  Doing errands, shopping? Y N  Comment wife drives -  Conservation officer, nature and eating ? N -  Using the Toilet? N -  In the past six months, have you accidently leaked urine? N -  Do you have problems with loss of bowel control? N -  Managing your Medications? N -  Managing your Finances? Y -  Comment wife does -  Runner, broadcasting/film/video? Y -  Comment wife does -  Some recent data might be hidden    Patient Care Team: [name], MD as PCP - General (Family Medicine) [name], MD as PCP - Family Medicine (Family Medicine) Murlean Iba, MD (Internal Medicine) Dionisio David, MD as Consulting Physician (Cardiology) Sharlotte Alamo, DPM (Podiatry) Ronita Hipps, Elmer Ramp., MD (Dentistry) Lucilla Lame, MD as Consulting Physician (Gastroenterology) Vladimir Crofts, MD as Consulting Physician (Neurology) Minor, Dalbert Garnet, RN as [place] Management Sindy Guadeloupe, MD as Consulting Physician (Oncology)   Assessment:   This is a routine wellness examination for  [place].  Exercise Activities and Dietary recommendations Current Exercise Habits: The patient does not participate in regular exercise  at present, Exercise limited by: None identified  Goals    . Increase water intake     Recommend drinking at least 5-6 glasses of water a day        Fall Risk Fall Risk  02/20/2019 03/14/2018 02/14/2018 11/19/2017 06/05/2017  Falls in the past year? 0 0 0 Yes No  Number falls in past yr: 0 0 - 1 -  Injury with Fall? 0 0 - Yes -  Comment - - - nose bleed -  Risk Factor Category  - - - High Fall Risk -  Risk for fall due to : - - - History of fall(s);Impaired mobility;Impaired balance/gait -  Follow up - - - Falls evaluation completed;Education provided;Falls prevention discussed -   FALL RISK PREVENTION PERTAINING TO THE HOME:  Any stairs in or around the home? Yes  If so, are there any without handrails? No   Home free of loose throw rugs in walkways, pet beds, electrical cords, etc? Yes  Adequate lighting in your home to reduce risk of falls? Yes   ASSISTIVE DEVICES UTILIZED TO PREVENT FALLS:  Life alert? yes Use of a cane, walker or w/c? Yes  cane & wheelchair  Grab bars in the bathroom? Yes  Shower chair or bench in shower? Yes  Elevated toilet seat or a handicapped toilet? Yes    TIMED UP AND GO:  Unable to perform    Depression Screen PHQ 2/9 Scores 02/20/2019 02/14/2018 12/27/2017 11/19/2017  PHQ - 2 Score 0 0 2 2  PHQ- 9 Score - - 3 10    Cognitive Function MMSE - Mini Mental State Exam 02/01/2016  Orientation to time 5  Orientation to Place 5  Registration 2  Attention/ Calculation 3  Recall 3  Language- name 2 objects 2  Language- repeat 1  Language- follow 3 step command 3  Language- read & follow direction 1  Write a sentence 1  Copy design 1  Total score 27     6CIT Screen 02/14/2018 12/11/2016  What Year? 0 points 0 points  What month? 0 points 0 points  What time? 0 points 0 points  Count back from 20 0 points 0  points  Months in reverse 0 points 0 points  Repeat phrase 4 points 2 points  Total Score 4 2    Immunization History  Administered Date(s) Administered  . Fluad Quad(high Dose 65+) 11/27/2018  . Influenza, High Dose Seasonal PF 11/26/2015, 12/11/2016, 12/05/2017  . Influenza,inj,Quad PF,6+ Mos 12/03/2014  . Influenza-Unspecified 11/12/2013  . PPD Test 10/05/2017  . Pneumococcal Conjugate-13 09/03/2013  . Pneumococcal Polysaccharide-23 10/15/1995, 11/13/2000  . Td 08/08/2004, 10/06/2015  . Zoster 02/26/2006    Qualifies for Shingles Vaccine? Yes  Zostavax completed 2008. Due for Shingrix. Education has been provided regarding the importance of this vaccine. Pt has been advised to call insurance company to determine out of pocket expense. Advised may also receive vaccine at local pharmacy or Health Dept. Verbalized acceptance and understanding.   Tdap: up to date   Flu Vaccine: up to date   Pneumococcal Vaccine: up to date   Screening Tests Health Maintenance  Topic Date Due  . FOOT EXAM  02/26/2018  . OPHTHALMOLOGY EXAM  04/25/2018  . HEMOGLOBIN A1C  07/02/2019  . URINE MICROALBUMIN  01/02/2020  . TETANUS/TDAP  10/05/2025  . INFLUENZA VACCINE  Completed  . PNA vac Low Risk Adult  Completed   Cancer Screenings:  Colorectal Screening: no longer required   Lung  Cancer Screening: (Low Dose CT Chest recommended if Age 19-80 years, 30 pack-year currently smoking OR have quit w/in 15years.) does not qualify.    Additional Screening:  Hepatitis C Screening: does not qualify  Dental Screening: Recommended annual dental exams for proper oral hygiene  Community Resource Referral:  CRR required this visit?  No        Plan:  I have personally reviewed and addressed the Medicare Annual Wellness questionnaire and have noted the following in the patient's chart:  A. Medical and social history B. Use of alcohol, tobacco or illicit drugs  C. Current medications and  supplements D. Functional ability and status E.  Nutritional status F.  Physical activity G. Advance directives H. List of other physicians I.  Hospitalizations, surgeries, and ER visits in previous 12 months J.  [place] such as hearing and vision if needed, cognitive and depression L. Referrals and appointments   In addition, I have reviewed and discussed with patient certain preventive protocols, quality metrics, and best practice recommendations. A written personalized care plan for preventive services as well as general preventive health recommendations were provided to patient.   Signed,   Bevelyn Ngo, LPN  05/19/2701 Nurse Health Advisor  Nurse Notes: Patient is asking if there are any contraindications to him taking the covid-19 vaccination?

## 2019-02-20 NOTE — Patient Instructions (Signed)
Ray Lamb , Thank you for taking time to come for your Medicare Wellness Visit. I appreciate your ongoing commitment to your health goals. Please review the following plan we discussed and let me know if I can assist you in the future.   Screening recommendations/referrals: Colonoscopy: no longer required Recommended yearly ophthalmology/optometry visit for glaucoma screening and checkup Recommended yearly dental visit for hygiene and checkup  Vaccinations: Influenza vaccine: up to date Pneumococcal vaccine: up to date Tdap vaccine: up to date Shingles vaccine: shingrix eligible     Advanced directives: Advance directive discussed with you today. Once this is complete please bring a copy in to our office so we can scan it into your chart.  Conditions/risks identified: diabetic, please schedule diabetic eye exam.   Next appointment: follow up in one year for your annual wellness visit   Preventive Care 84 Years and Older, Male Preventive care refers to lifestyle choices and visits with your health care provider that can promote health and wellness. What does preventive care include?  A yearly physical exam. This is also called an annual well check.  Dental exams once or twice a year.  Routine eye exams. Ask your health care provider how often you should have your eyes checked.  Personal lifestyle choices, including:  Daily care of your teeth and gums.  Regular physical activity.  Eating a healthy diet.  Avoiding tobacco and drug use.  Limiting alcohol use.  Practicing safe sex.  Taking low doses of aspirin every day.  Taking vitamin and mineral supplements as recommended by your health care provider. What happens during an annual well check? The services and screenings done by your health care provider during your annual well check will depend on your age, overall health, lifestyle risk factors, and family history of disease. Counseling  Your health care provider  may ask you questions about your:  Alcohol use.  Tobacco use.  Drug use.  Emotional well-being.  Home and relationship well-being.  Sexual activity.  Eating habits.  History of falls.  Memory and ability to understand (cognition).  Work and work Statistician. Screening  You may have the following tests or measurements:  Height, weight, and BMI.  Blood pressure.  Lipid and cholesterol levels. These may be checked every 5 years, or more frequently if you are over 47 years old.  Skin check.  Lung cancer screening. You may have this screening every year starting at age 72 if you have a 30-pack-year history of smoking and currently smoke or have quit within the past 15 years.  Fecal occult blood test (FOBT) of the stool. You may have this test every year starting at age 34.  Flexible sigmoidoscopy or colonoscopy. You may have a sigmoidoscopy every 5 years or a colonoscopy every 10 years starting at age 35.  Prostate cancer screening. Recommendations will vary depending on your family history and other risks.  Hepatitis C blood test.  Hepatitis B blood test.  Sexually transmitted disease (STD) testing.  Diabetes screening. This is done by checking your blood sugar (glucose) after you have not eaten for a while (fasting). You may have this done every 1-3 years.  Abdominal aortic aneurysm (AAA) screening. You may need this if you are a current or former smoker.  Osteoporosis. You may be screened starting at age 38 if you are at high risk. Talk with your health care provider about your test results, treatment options, and if necessary, the need for more tests. Vaccines  Your health care  provider may recommend certain vaccines, such as:  Influenza vaccine. This is recommended every year.  Tetanus, diphtheria, and acellular pertussis (Tdap, Td) vaccine. You may need a Td booster every 10 years.  Zoster vaccine. You may need this after age 47.  Pneumococcal 13-valent  conjugate (PCV13) vaccine. One dose is recommended after age 31.  Pneumococcal polysaccharide (PPSV23) vaccine. One dose is recommended after age 50. Talk to your health care provider about which screenings and vaccines you need and how often you need them. This information is not intended to replace advice given to you by your health care provider. Make sure you discuss any questions you have with your health care provider. Document Released: 02/26/2015 Document Revised: 10/20/2015 Document Reviewed: 12/01/2014 Elsevier Interactive Patient Education  2017 [place] Prevention in the Home Falls can cause injuries. They can happen to people of all ages. There are many things you can do to make your home safe and to help prevent falls. What can I do on the outside of my home?  Regularly fix the edges of walkways and driveways and fix any cracks.  Remove anything that might make you trip as you walk through a door, such as a raised step or threshold.  Trim any bushes or trees on the path to your home.  Use bright outdoor lighting.  Clear any walking paths of anything that might make someone trip, such as rocks or tools.  Regularly check to see if handrails are loose or broken. Make sure that both sides of any steps have handrails.  Any raised decks and porches should have guardrails on the edges.  Have any leaves, snow, or ice cleared regularly.  Use sand or salt on walking paths during winter.  Clean up any spills in your garage right away. This includes oil or grease spills. What can I do in the bathroom?  Use night lights.  Install grab bars by the toilet and in the tub and shower. Do not use towel bars as grab bars.  Use non-skid mats or decals in the tub or shower.  If you need to sit down in the shower, use a plastic, non-slip stool.  Keep the floor dry. Clean up any water that spills on the floor as soon as it happens.  Remove soap buildup in the tub or  shower regularly.  Attach bath mats securely with double-sided non-slip rug tape.  Do not have throw rugs and other things on the floor that can make you trip. What can I do in the bedroom?  Use night lights.  Make sure that you have a light by your bed that is easy to reach.  Do not use any sheets or blankets that are too big for your bed. They should not hang down onto the floor.  Have a firm chair that has side arms. You can use this for support while you get dressed.  Do not have throw rugs and other things on the floor that can make you trip. What can I do in the kitchen?  Clean up any spills right away.  Avoid walking on wet floors.  Keep items that you use a lot in easy-to-reach places.  If you need to reach something above you, use a strong step stool that has a grab bar.  Keep electrical cords out of the way.  Do not use floor polish or wax that makes floors slippery. If you must use wax, use non-skid floor wax.  Do not have throw  rugs and other things on the floor that can make you trip. What can I do with my stairs?  Do not leave any items on the stairs.  Make sure that there are handrails on both sides of the stairs and use them. Fix handrails that are broken or loose. Make sure that handrails are as long as the stairways.  Check any carpeting to make sure that it is firmly attached to the stairs. Fix any carpet that is loose or worn.  Avoid having throw rugs at the top or bottom of the stairs. If you do have throw rugs, attach them to the floor with carpet tape.  Make sure that you have a light switch at the top of the stairs and the bottom of the stairs. If you do not have them, ask someone to add them for you. What else can I do to help prevent falls?  Wear shoes that:  Do not have high heels.  Have rubber bottoms.  Are comfortable and fit you well.  Are closed at the toe. Do not wear sandals.  If you use a stepladder:  Make sure that it is fully  opened. Do not climb a closed stepladder.  Make sure that both sides of the stepladder are locked into place.  Ask someone to hold it for you, if possible.  Clearly mark and make sure that you can see:  Any grab bars or handrails.  First and last steps.  Where the edge of each step is.  Use tools that help you move around (mobility aids) if they are needed. These include:  Canes.  Walkers.  Scooters.  Crutches.  Turn on the lights when you go into a dark area. Replace any light bulbs as soon as they burn out.  Set up your furniture so you have a clear path. Avoid moving your furniture around.  If any of your floors are uneven, fix them.  If there are any pets around you, be aware of where they are.  Review your medicines with your doctor. Some medicines can make you feel dizzy. This can increase your chance of falling. Ask your doctor what other things that you can do to help prevent falls. This information is not intended to replace advice given to you by your health care provider. Make sure you discuss any questions you have with your health care provider. Document Released: 11/26/2008 Document Revised: 07/08/2015 Document Reviewed: 03/06/2014 Elsevier Interactive Patient Education  2017 [employer].

## 2019-02-25 ENCOUNTER — Ambulatory Visit (INDEPENDENT_AMBULATORY_CARE_PROVIDER_SITE_OTHER): Payer: Medicare Other | Admitting: Vascular Surgery

## 2019-02-25 ENCOUNTER — Other Ambulatory Visit: Payer: Self-pay

## 2019-02-25 ENCOUNTER — Encounter (INDEPENDENT_AMBULATORY_CARE_PROVIDER_SITE_OTHER): Payer: Self-pay | Admitting: Vascular Surgery

## 2019-02-25 VITALS — BP 120/71 | HR 82 | Resp 12 | Ht 66.0 in | Wt 150.0 lb

## 2019-02-25 DIAGNOSIS — I2510 Atherosclerotic heart disease of native coronary artery without angina pectoris: Secondary | ICD-10-CM | POA: Diagnosis not present

## 2019-02-25 DIAGNOSIS — N185 Chronic kidney disease, stage 5: Secondary | ICD-10-CM

## 2019-02-25 DIAGNOSIS — E782 Mixed hyperlipidemia: Secondary | ICD-10-CM

## 2019-02-25 DIAGNOSIS — N186 End stage renal disease: Secondary | ICD-10-CM

## 2019-02-25 DIAGNOSIS — E1122 Type 2 diabetes mellitus with diabetic chronic kidney disease: Secondary | ICD-10-CM | POA: Diagnosis not present

## 2019-02-25 NOTE — Assessment & Plan Note (Signed)
Continue cardiac and antihypertensive medications as already ordered and reviewed, no changes at this time. Continue statin as ordered and reviewed, no changes at this time Nitrates PRN for chest pain  

## 2019-02-25 NOTE — Assessment & Plan Note (Signed)
An underlying cause of his renal failure and blood glucose control important in reducing the progression of atherosclerotic disease. Also, involved in wound healing. On appropriate medications.  

## 2019-02-25 NOTE — Patient Instructions (Signed)
Peritoneal Dialysis Catheter Placement, Care After This sheet gives you information about how to care for yourself after your procedure. Your health care provider may also give you more specific instructions. If you have problems or questions, contact your health care provider. What can I expect after the procedure?  After the procedure, it is common to have some pain or discomfort in your abdomen, your incision area, or both.  You may need to wait 2 weeks after your procedure before you can start peritoneal dialysis treatment. If you need dialysis before that time, your health care provider may begin peritoneal dialysis treatment early or offer kidney dialysis treatments (hemodialysis) until you heal. Follow these instructions at home: Incision care   Follow instructions from your health care provider about how to take care of your incision(s). Make sure you: ? Change your dressing only as told by your health care provider. Your health care provider may tell you to not touch or change your dressing. ? Wash your hands with soap and water before you change your bandage (dressing). If soap and water are not available, use hand sanitizer. ? Leave stitches (sutures), skin glue, or adhesive strips in place. These skin closures may need to stay in place for two weeks or longer. If adhesive strip edges start to loosen and curl up, you may trim the loose edges. Do not remove adhesive strips completely unless your health care provider tells you to do that.  Check your incision area(s) every day for signs of infection. (If you were instructed to not touch or change your dressing, look at your dressing for signs of infection.) Check for: ? Redness, swelling, or more pain. ? Fluid or blood. ? Warmth. ? Pus or a bad smell. Medicines  Take over-the-counter and prescription medicines only as told by your health care provider.  If you were prescribed an antibiotic medicine, take it as told by your health  care provider. Do not stop using the antibiotic even if your condition improves. Driving  Do not drive or ride in a car until your health care provider approves. Your seat belt could move the catheter out of position or cause irritation by rubbing on your incision.  Do not drive or use heavy machinery while taking prescription pain medicine. Activity  Rest and limit your activity. Return to your normal activities as told by your health care provider. Ask your health care provider what activities are safe for you.  Do not lift anything that is heavier than 10 lb (4.5 kg), or the limit that you are told, until your health care provider says that it is safe. Preventing constipation  To prevent or treat constipation, your health care provider may recommend that you: ? Take over-the-counter or prescription medicines. ? Eat foods that are high in fiber, such as fresh fruits and vegetables, whole grains, and beans. ? Limit foods that are high in fat and processed sugars, such as fried and sweet foods. General instructions  Do not use any products that contain nicotine or tobacco, such as cigarettes and e-cigarettes. If you need help quitting, ask your health care provider.  Follow instructions from your health care provider about eating or drinking restrictions.  Do not take baths, swim, or use a hot tub until your health care provider approves. Ask your health care provider if you may take showers. You may only be allowed to take sponge baths.  Wear loose-fitting clothing that keeps the catheter covered so that it cannot get caught on something.  Keep your catheter clean and dry.  Keep all follow-up visits as told by your health care provider. This is important. Contact a health care provider if:  You have a fever.  You have redness, swelling, or more pain around an incision.  You have fluid or blood coming from an incision.  An incision feels warm to the touch.  You have pus or a  bad smell coming from an incision.  You cannot eat or drink without vomiting. Get help right away if:  You have problems breathing.  You are confused.  You have trouble speaking.  You have severe pain in your abdomen that does not get better with treatment.  You have bright red blood in your stool (feces), or your stool is dark black and looks like tar. These symptoms may represent a serious problem that is an emergency. Do not wait to see if the symptoms will go away. Get medical help right away. Call your local emergency services (911 in the [place] Do not drive yourself to the hospital. Summary  After the procedure, it is common to have some pain or discomfort in your abdomen, your incision, or both.  You may have to wait 2 weeks after your procedure before you can start peritoneal dialysis treatment.  Check your incision area(s) every day for signs of infection.  Get medical help right away if you have severe pain in your abdomen that does not get better with treatment. This information is not intended to replace advice given to you by your health care provider. Make sure you discuss any questions you have with your health care provider. Document Revised: 05/23/2018 Document Reviewed: 08/04/2016 Elsevier Patient Education  [place].

## 2019-02-25 NOTE — Assessment & Plan Note (Signed)
The patient has had continued decline in his renal function is now approaching dialysis dependence.  He has elected to do peritoneal dialysis for his dialysis access needs which seems reasonable given his cardiac issues and other comorbidities.  He has not had abdominal surgery.  We discussed that this would be done with general anesthesia and a same-day procedure.  He can begin training 1 to 2 weeks after the procedure.  Risks and benefits the procedure were discussed in detail with the patient and he desires to proceed.

## 2019-02-25 NOTE — Assessment & Plan Note (Signed)
lipid control important in reducing the progression of atherosclerotic disease. Continue statin therapy  

## 2019-02-25 NOTE — Progress Notes (Signed)
MRN : [id number]  Ray Lamb is a 86 y.o. ([birth year]) male who presents with chief complaint of  Chief Complaint  Patient presents with  . Follow-up    Discuss PD Cath  .  History of Present Illness: Patient returns today in follow up of his renal failure.  Patient had a PermCath placed and later removed when he had return of renal function back in 2019.  He had a vein mapping showing no adequate superficial vein last year and we held off on any access placements.  He has not resumed dialysis, but has now developed significant lower extremity swelling, lack of appetite, lack of energy, and other uremic symptoms that would prompt dialysis at this time.  His nephrologist has discussed situation with him and he would like to do peritoneal dialysis.  He has not had previous abdominal surgery.  He has no abdominal pain or symptoms at this time.  No fevers or chills or signs of infection.  Current Outpatient Medications  Medication Sig Dispense Refill  . ASPIRIN 81 PO Take by mouth daily.    Marland Kitchen atorvastatin (LIPITOR) 80 MG tablet Take 1 tablet (80 mg total) by mouth at bedtime. 90 tablet 4  . carvedilol (COREG) 12.5 MG tablet Take by mouth 2 (two) times daily.     . clopidogrel (PLAVIX) 75 MG tablet TAKE 1 TABLET DAILY 90 tablet 3  . ezetimibe (ZETIA) 10 MG tablet Take 1 tablet (10 mg total) by mouth daily. 90 tablet 4  . furosemide (LASIX) 40 MG tablet Take 40 mg by mouth daily as needed for fluid.     . isosorbide mononitrate (IMDUR) 30 MG 24 hr tablet Take 1.5 tablets (45 mg total) by mouth daily. 135 tablet 1  . JANUVIA 25 MG tablet TAKE 1 TABLET BY MOUTH EVERY DAY 90 tablet 1  . Loratadine (CLARITIN PO) Take by mouth daily.    . metoprolol tartrate (LOPRESSOR) 50 MG tablet Take 1 tablet (50 mg total) by mouth every 12 (twelve) hours.    . mirtazapine (REMERON) 7.5 MG tablet Take 7.5 mg by mouth at bedtime.    . Multiple Vitamins-Minerals (CENTRUM SILVER ULTRA MENS PO) Take by mouth daily.     . sodium bicarbonate 650 MG tablet Take by mouth.    . timolol (TIMOPTIC) 0.5 % ophthalmic solution Place 1 drop into both eyes daily.     . TURMERIC PO Take 1,300 mg by mouth daily.     Marland Kitchen donepezil (ARICEPT) 10 MG tablet Take by mouth daily.     . ondansetron (ZOFRAN) 4 MG tablet Take 4 mg by mouth every 8 (eight) hours as needed.     No current facility-administered medications for this visit.   Facility-Administered Medications Ordered in Other Visits  Medication Dose Route Frequency Provider Last Rate Last Admin  . sodium chloride flush (NS) 0.9 % injection 3 mL  3 mL Intravenous Q12H Jake Bathe, FNP        Past Medical History:  Diagnosis Date  . Anemia   . Chronic kidney disease    STAGE 4  . Coronary atherosclerosis    CABG X 4  . Diabetes mellitus without complication ([place])   . Edema    ankle  . GERD (gastroesophageal reflux disease)   . Glaucoma   . Hearing loss   . Heart murmur   . History of hiatal hernia   . Hyperlipidemia   . Hypertension   . Mini stroke ([place])   .  Mitral insufficiency   . Myocardial infarction ([place])    mild, heart cath done no stents needed  . Stroke [facility]) 2011   TIA    Past Surgical History:  Procedure Laterality Date  . CATARACT EXTRACTION W/PHACO Left 09/05/2016   Procedure: CATARACT EXTRACTION PHACO AND INTRAOCULAR LENS PLACEMENT (IOC);  Surgeon: Birder Robson, MD;  Location: [facility] ORS;  Service: Ophthalmology;  Laterality: Left;  [place] 00:43.1 AP% 22.6 CDE 9.77 Fluid Pack lot #[id number]  . CATARACT EXTRACTION W/PHACO Right 09/26/2016   Procedure: CATARACT EXTRACTION PHACO AND INTRAOCULAR LENS PLACEMENT ([place]);  Surgeon: Birder Robson, MD;  Location: [facility] ORS;  Service: Ophthalmology;  Laterality: Right;  [place] 01:00 AP% 16.0 CDE 9.71 Fluid pack lot # [id number]  . CORONARY ARTERY BYPASS GRAFT  2003  . DIALYSIS/PERMA CATHETER INSERTION N/A 10/04/2017   Procedure: DIALYSIS/PERMA CATHETER INSERTION;  Surgeon: Algernon Huxley, MD;   Location: [place] [facility] LAB;  Service: Cardiovascular;  Laterality: N/A;  . DIALYSIS/PERMA CATHETER REMOVAL N/A 11/01/2017   Procedure: DIALYSIS/PERMA CATHETER REMOVAL;  Surgeon: Algernon Huxley, MD;  Location: [place] CV LAB;  Service: Cardiovascular;  Laterality: N/A;  . ESOPHAGOGASTRODUODENOSCOPY (EGD) WITH PROPOFOL N/A 04/25/2016   Procedure: ESOPHAGOGASTRODUODENOSCOPY (EGD) WITH PROPOFOL;  Surgeon: Lucilla Lame, MD;  Location: [facility] ENDOSCOPY;  Service: Endoscopy;  Laterality: N/A;  . JOINT REPLACEMENT Right 2010   knee  . LEFT HEART CATH AND CORONARY ANGIOGRAPHY N/A 10/04/2017   Procedure: LEFT HEART CATH AND CORONARY ANGIOGRAPHY;  Surgeon: Isaias Cowman, MD;  Location: [place] [facility];  Service: Cardiovascular;  Laterality: N/A;  . LEFT HEART CATH AND CORONARY ANGIOGRAPHY Left 07/01/2018   Procedure: LEFT HEART CATH AND CORONARY ANGIOGRAPHY;  Surgeon: Dionisio David, MD;  Location: [place] [facility];  Service: Cardiovascular;  Laterality: Left;  . SKIN CANCER EXCISION       Social History   Tobacco Use  . Smoking status: Former Smoker    Quit date: 09/02/1964    Years since quitting: 54.5  . Smokeless tobacco: Never Used  Substance Use Topics  . Alcohol use: No    Alcohol/week: 0.0 standard drinks  . Drug use: No    Family History  Problem Relation Age of Onset  . Stroke Mother   . Diabetes Mother   . Diabetes Sister   . Diabetes Maternal Grandmother      Allergies  Allergen Reactions  . Bee Venom Hives  . Iodinated Diagnostic Agents Rash  . Metrizamide Rash  . Other Rash and Other (See Comments)    Dye     REVIEW OF SYSTEMS (Negative unless checked)  Constitutional: _0 Weight loss  _1 Fever  _2 Chills Cardiac: _3 Chest pain   _4 Chest pressure   _5 Palpitations   _6 Shortness of breath when laying flat   _7 Shortness of breath at rest   _8 Shortness of breath with exertion. Vascular:  _9 Pain in legs with walking   _10 Pain in legs at rest   _11 Pain in  legs when laying flat   _12 Claudication   _13 Pain in feet when walking  _14 Pain in feet at rest  _15 Pain in feet when laying flat   _16 History of DVT   _17 Phlebitis   _18 Swelling in legs   _19 Varicose veins   _20 Non-healing ulcers Pulmonary:   _21 Uses home oxygen   _22 Productive cough   _23 Hemoptysis   _24 Wheeze  _25 COPD   _26 Asthma Neurologic:  _27 Dizziness  _28 Blackouts   _29 Seizures   _30 History of stroke   _31 History of TIA  _32 Aphasia   _33 Temporary blindness   _34   Dysphagia   _0 Weakness or numbness in arms   _1 Weakness or numbness in legs Musculoskeletal:  _2 Arthritis   _3 Joint swelling   _4 Joint pain   _5 Low back pain Hematologic:  _6 Easy bruising  _7 Easy bleeding   _8 Hypercoagulable state   _9 Anemic   Gastrointestinal:  _10 Blood in stool   _11 Vomiting blood  _12 Gastroesophageal reflux/heartburn   _13 Abdominal pain Genitourinary:  _14 Chronic kidney disease   _15 Difficult urination  _16 Frequent urination  _17 Burning with urination   _18 Hematuria Skin:  _19 Rashes   _20 Ulcers   _21 Wounds Psychological:  _22 History of anxiety   _23  History of major depression.  Physical Examination  BP 120/71 (BP Location: Right Arm)   Pulse 82   Resp 12   Ht _24  (1.676 m)   Wt 150 lb (68 kg)   BMI 24.21 kg/m  Gen:  WD/WN, NAD Head: Delta/AT, No temporalis wasting. Ear/Nose/Throat: Hearing grossly intact, nares w/o erythema or drainage Eyes: Conjunctiva clear. Sclera non-icteric Neck: Supple.  Trachea midline Pulmonary:  Good air movement, no use of accessory muscles.  Cardiac: irregular Vascular:  Vessel Right Left  Radial Palpable Palpable                          PT Not Palpable Not Palpable  DP Trace Palpable Not Palpable   Gastrointestinal: soft, non-tender/non-distended. No guarding/reflex.  Musculoskeletal: M/S 5/5 throughout.  No deformity or atrophy. 2+ BLE edema. Neurologic: Sensation grossly intact in extremities.  Symmetrical.  Speech is fluent.  Psychiatric: Judgment intact, Mood & affect appropriate for pt's  clinical situation. Dermatologic: No rashes or ulcers noted.  No cellulitis or open wounds.       Labs Recent Results (from the past 2160 hour(s))  Bayer DCA Hb A1c Waived     Status: None   Collection Time: 01/02/19 11:26 AM  Result Value Ref Range   HB A1C (BAYER DCA - WAIVED) 5.7 <7.0 %    Comment:                                       Diabetic Adult            <7.0                                       Healthy Adult        4.3 - 5.7                                                           (DCCT/NGSP) American Diabetes Association's Summary of Glycemic Recommendations for Adults with Diabetes: Hemoglobin A1c <7.0%. More stringent glycemic goals (A1c <6.0%) may further reduce complications at the cost of increased risk of hypoglycemia.   Microalbumin, Urine Waived     Status: Abnormal   Collection Time: 01/02/19 11:26 AM  Result Value Ref Range   Microalb, Ur Waived 150 (H) 0 - 19 mg/L   Creatinine, Urine Waived 100 10 - 300 mg/dL   Microalb/Creat Ratio >300 (H) <30 mg/g    Comment:  Abnormal:       30 - 300                         High Abnormal:           >300   UA/M w/rflx Culture, Routine     Status: Abnormal   Collection Time: 01/02/19 11:26 AM   Specimen: Blood   BLD  Result Value Ref Range   Specific Gravity, UA 1.025 1.005 - 1.030   pH, UA 5.0 5.0 - 7.5   Color, UA Yellow Yellow   Appearance Ur Clear Clear   Leukocytes,UA Negative Negative   Protein,UA 2+ (A) Negative/Trace   Glucose, UA Negative Negative   Ketones, UA Negative Negative   RBC, UA 2+ (A) Negative   Bilirubin, UA Negative Negative   Urobilinogen, Ur 0.2 0.2 - 1.0 mg/dL   Nitrite, UA Negative Negative   Microscopic Examination See below:   Microscopic Examination     Status: Abnormal   Collection Time: 01/02/19 11:26 AM   BLD  Result Value Ref Range   WBC, UA 0-5 0 - 5 /hpf   RBC 3-10 (A) 0 - 2 /hpf   Epithelial Cells (non renal) 0-10 0 - 10 /hpf   Casts  Present None seen /lpf   Cast Type Granular casts (A) N/A   Mucus, UA Present Not Estab.   Bacteria, UA None seen None seen/Few  CBC with Differential OUT     Status: Abnormal   Collection Time: 01/02/19 11:59 AM  Result Value Ref Range   WBC 7.9 3.4 - 10.8 x10E3/uL   RBC 2.68 (LL) 4.14 - 5.80 x10E6/uL   Hemoglobin 8.1 (L) 13.0 - 17.7 g/dL   Hematocrit 24.7 (L) 37.5 - 51.0 %   MCV 92 79 - 97 fL   MCH 30.2 26.6 - 33.0 pg   MCHC 32.8 31.5 - 35.7 g/dL   RDW 12.4 11.6 - 15.4 %   Platelets 226 150 - 450 x10E3/uL   Neutrophils 61 Not Estab. %   Lymphs 29 Not Estab. %   Monocytes 7 Not Estab. %   Eos 2 Not Estab. %   Basos 1 Not Estab. %   Neutrophils Absolute 4.8 1.4 - 7.0 x10E3/uL   Lymphocytes Absolute 2.3 0.7 - 3.1 x10E3/uL   Monocytes Absolute 0.6 0.1 - 0.9 x10E3/uL   EOS (ABSOLUTE) 0.2 0.0 - 0.4 x10E3/uL   Basophils Absolute 0.1 0.0 - 0.2 x10E3/uL   Immature Granulocytes 0 Not Estab. %   Immature Grans (Abs) 0.0 0.0 - 0.1 x10E3/uL  Comp Met (CMET)     Status: Abnormal   Collection Time: 01/02/19 11:59 AM  Result Value Ref Range   Glucose 201 (H) 65 - 99 mg/dL   BUN 72 (H) 8 - 27 mg/dL   Creatinine, Ser 4.10 (H) 0.76 - 1.27 mg/dL   GFR calc non Af Amer 12 (L) >59 mL/min/1.73   GFR calc Af Amer 14 (L) >59 mL/min/1.73   BUN/Creatinine Ratio 18 10 - 24   Sodium 137 134 - 144 mmol/L   Potassium 4.8 3.5 - 5.2 mmol/L   Chloride 98 96 - 106 mmol/L   CO2 22 20 - 29 mmol/L   Calcium 8.6 8.6 - 10.2 mg/dL   Total Protein 8.3 6.0 - 8.5 g/dL   Albumin 3.7 3.6 - 4.6 g/dL   Globulin, Total 4.6 (H) 1.5 - 4.5 g/dL   Albumin/Globulin Ratio 0.8 (L) 1.2 - 2.2  Bilirubin Total 0.3 0.0 - 1.2 mg/dL   Alkaline Phosphatase 87 39 - 117 IU/L   AST 26 0 - 40 IU/L   ALT 15 0 - 44 IU/L  Lipid Panel w/o Chol/HDL Ratio OUT     Status: Abnormal   Collection Time: 01/02/19 11:59 AM  Result Value Ref Range   Cholesterol, Total 100 100 - 199 mg/dL   Triglycerides 100 0 - 149 mg/dL   HDL 37 (L) >39  mg/dL   VLDL Cholesterol Cal 19 5 - 40 mg/dL   LDL Chol Calc (NIH) 44 0 - 99 mg/dL  TSH     Status: None   Collection Time: 01/02/19 11:59 AM  Result Value Ref Range   TSH 3.380 0.450 - 4.500 uIU/mL  CBC diff arch     Status: Abnormal   Collection Time: 01/24/19 11:16 AM  Result Value Ref Range   WBC 8.6 4.0 - 10.5 K/uL   RBC 1.99 (L) 4.22 - 5.81 MIL/uL   Hemoglobin 6.2 (L) 13.0 - 17.0 g/dL   HCT 19.7 (L) 39.0 - 52.0 %   MCV 99.0 80.0 - 100.0 fL   MCH 31.2 26.0 - 34.0 pg   MCHC 31.5 30.0 - 36.0 g/dL   RDW 15.0 11.5 - 15.5 %   Platelets 249 150 - 400 K/uL   nRBC 0.0 0.0 - 0.2 %   Neutrophils Relative % 72 %   Neutro Abs 6.2 1.7 - 7.7 K/uL   Lymphocytes Relative 18 %   Lymphs Abs 1.6 0.7 - 4.0 K/uL   Monocytes Relative 7 %   Monocytes Absolute 0.6 0.1 - 1.0 K/uL   Eosinophils Relative 2 %   Eosinophils Absolute 0.2 0.0 - 0.5 K/uL   Basophils Relative 1 %   Basophils Absolute 0.1 0.0 - 0.1 K/uL   Immature Granulocytes 0 %   Abs Immature Granulocytes 0.03 0.00 - 0.07 K/uL    Comment: Performed at [facility], [address] [place], [place] [address]  CMP arch     Status: Abnormal   Collection Time: 01/24/19 11:16 AM  Result Value Ref Range   Sodium 127 (L) 135 - 145 mmol/L   Potassium 4.6 3.5 - 5.1 mmol/L   Chloride 95 (L) 98 - 111 mmol/L   CO2 21 (L) 22 - 32 mmol/L   Glucose, Bld 260 (H) 70 - 99 mg/dL   BUN 101 (H) 8 - 23 mg/dL    Comment: RESULTS CONFIRMED BY MANUAL DILUTION   Creatinine, Ser 5.04 (H) 0.61 - 1.24 mg/dL   Calcium 8.0 (L) 8.9 - 10.3 mg/dL   Total Protein 7.7 6.5 - 8.1 g/dL   Albumin 2.7 (L) 3.5 - 5.0 g/dL   AST 22 15 - 41 U/L   ALT 16 0 - 44 U/L   Alkaline Phosphatase 74 38 - 126 U/L   Total Bilirubin 0.7 0.3 - 1.2 mg/dL   GFR calc non Af Amer 10 (L) >60 mL/min   GFR calc Af Amer 11 (L) >60 mL/min   Anion gap 11 5 - 15    Comment: Performed at [facility], [address] [place], [place] [address]  Folate arch     Status: None    Collection Time: 01/24/19 11:16 AM  Result Value Ref Range   Folate 34.0 >5.9 ng/mL    Comment: Performed at [facility], [address], [place], [place] [address]  Vit B12 arch     Status: Abnormal  Collection Time: 01/24/19 11:16 AM  Result Value Ref Range   Vitamin B-12 3,362 (H) 180 - 914 pg/mL    Comment: (NOTE) This assay is not validated for testing neonatal or myeloproliferative syndrome specimens for Vitamin B12 levels. Performed at [facility], [place] [address] [place], [place] [address]   Ferritin arch     Status: None   Collection Time: 01/24/19 11:16 AM  Result Value Ref Range   Ferritin 80 24 - 336 ng/mL    Comment: Performed at [facility], [address] [place], [place] [address]  Iron TIBC arch     Status: Abnormal   Collection Time: 01/24/19 11:16 AM  Result Value Ref Range   Iron 35 (L) 45 - 182 ug/dL   TIBC 259 250 - 450 ug/dL   Saturation Ratios 14 (L) 17.9 - 39.5 %   UIBC 224 ug/dL    Comment: Performed at [facility], [address] [place], [place] [address]  Retic arch     Status: Abnormal   Collection Time: 01/24/19 11:16 AM  Result Value Ref Range   Retic Ct Pct 4.2 (H) 0.4 - 3.1 %   RBC. 1.99 (L) 4.22 - 5.81 MIL/uL   Retic Count, Absolute 84.2 19.0 - 186.0 K/uL   Immature Retic Fract 29.2 (H) 2.3 - 15.9 %    Comment: Performed at [facility], [address] [place], [place] [address]  Multi Myelo arch     Status: Abnormal   Collection Time: 01/24/19 11:16 AM  Result Value Ref Range   IgG (Immunoglobin G), Serum 2,530 (H) 603 - 1,613 mg/dL   IgA 544 (H) 61 - 437 mg/dL   IgM (Immunoglobulin M), Srm 220 (H) 15 - 143 mg/dL   Total Protein ELP 7.3 6.0 - 8.5 g/dL   Albumin SerPl Elph-Mcnc 2.9 2.9 - 4.4 g/dL   Alpha 1 0.2 0.0 - 0.4 g/dL   Alpha2 Glob SerPl Elph-Mcnc 0.8 0.4 - 1.0 g/dL   B-Globulin SerPl Elph-Mcnc 1.0 0.7 - 1.3 g/dL   Gamma Glob SerPl Elph-Mcnc 2.3 (H) 0.4 - 1.8 g/dL   M Protein  SerPl Elph-Mcnc 0.5 (H) Not Observed g/dL   Globulin, Total 4.4 (H) 2.2 - 3.9 g/dL   Albumin/Glob SerPl 0.7 0.7 - 1.7   IFE 1 Comment (A)     Comment: (NOTE) Immunofixation shows IgM monoclonal protein with lambda light chain specificity. Polyclonal increase detected in one or more immunoglobulins.    Please Note Comment     Comment: (NOTE) Protein electrophoresis scan will follow via computer, mail, or courier delivery. Performed At: [facility] [place], [place] [id number] Rush Farmer MD [id number]   Hapto arch     Status: None   Collection Time: 01/24/19 11:16 AM  Result Value Ref Range   Haptoglobin 186 38 - 329 mg/dL    Comment: (NOTE) Performed At: [facility] [place], [place] [id number] Rush Farmer MD [id number]   TSH arch     Status: None   Collection Time: 01/24/19 11:16 AM  Result Value Ref Range   TSH 3.076 0.350 - 4.500 uIU/mL    Comment: Performed by a 3rd Generation assay with a functional sensitivity of <=0.01 uIU/mL. Performed at [facility], [address] [place], [place] [address]   Type and screen     Status: None   Collection Time: 01/24/19 11:16 AM  Result Value Ref Range   ABO/RH(D) A POS  Antibody Screen NEG    Sample Expiration 01/27/2019,2359    PT AG Type      POSITIVE FOR A1 ANTIGEN Performed at [facility], [address] [place], [place] [address]    Unit Number [id number]    Blood Component Type RBC LR PHER2    Unit division 00    Status of Unit ISSUED,FINAL    Transfusion Status OK TO TRANSFUSE    Crossmatch Result COMPATIBLE   Kappa/lambda light chains     Status: Abnormal   Collection Time: 01/24/19 11:16 AM  Result Value Ref Range   Kappa free light chain 375.0 (H) 3.3 - 19.4 mg/L   Lamda free light chains 253.7 (H) 5.7 - 26.3 mg/L   Kappa, lamda light chain ratio 1.48 0.26 - 1.65    Comment: (NOTE) Performed At: [facility] [address] [place], [place] [id number] Rush Farmer MD [id number]   BPAM RBC     Status: None   Collection Time: 01/24/19 11:16 AM  Result Value Ref Range   ISSUE DATE / TIME [id number]    Blood Product Unit Number [id number]    PRODUCT CODE [id number]    Unit Type and Rh 5100    Blood Product Expiration Date [id number]   Prepare RBC     Status: None   Collection Time: 01/24/19  1:30 PM  Result Value Ref Range   Order Confirmation      ORDER PROCESSED BY BLOOD BANK Performed at [facility], [address], [place], [place] [address]     Radiology No results found.  Assessment/Plan  Mixed hyperlipidemia lipid control important in reducing the progression of atherosclerotic disease. Continue statin therapy   CAD (coronary artery disease) Continue cardiac and antihypertensive medications as already ordered and reviewed, no changes at this time. Continue statin as ordered and reviewed, no changes at this time Nitrates PRN for chest pain   ESRD (end stage renal disease) ([place]) The patient has had continued decline in his renal function is now approaching dialysis dependence.  He has elected to do peritoneal dialysis for his dialysis access needs which seems reasonable given his cardiac issues and other comorbidities.  He has not had abdominal surgery.  We discussed that this would be done with general anesthesia and a same-day procedure.  He can begin training 1 to 2 weeks after the procedure.  Risks and benefits the procedure were discussed in detail with the patient and he desires to proceed.  Diabetes ([place]) An underlying cause of his renal failure and blood glucose control important in reducing the progression of atherosclerotic disease. Also, involved in wound healing. On appropriate medications.     Leotis Pain, MD  02/25/2019 2:43 PM    This note was created with Dragon medical transcription system.  Any errors from dictation are purely unintentional

## 2019-03-03 ENCOUNTER — Telehealth: Payer: Self-pay

## 2019-03-03 DIAGNOSIS — F0280 Dementia in other diseases classified elsewhere without behavioral disturbance: Secondary | ICD-10-CM | POA: Diagnosis not present

## 2019-03-03 DIAGNOSIS — Z9181 History of falling: Secondary | ICD-10-CM | POA: Diagnosis not present

## 2019-03-03 DIAGNOSIS — R29898 Other symptoms and signs involving the musculoskeletal system: Secondary | ICD-10-CM | POA: Diagnosis not present

## 2019-03-03 DIAGNOSIS — G301 Alzheimer's disease with late onset: Secondary | ICD-10-CM | POA: Diagnosis not present

## 2019-03-06 ENCOUNTER — Telehealth (INDEPENDENT_AMBULATORY_CARE_PROVIDER_SITE_OTHER): Payer: Self-pay

## 2019-03-06 ENCOUNTER — Other Ambulatory Visit (INDEPENDENT_AMBULATORY_CARE_PROVIDER_SITE_OTHER): Payer: Self-pay | Admitting: Nurse Practitioner

## 2019-03-06 NOTE — Telephone Encounter (Signed)
Spoke with the patient and he is now scheduled with Dr. Lucky Cowboy for PD cath insertion surgery on 03/12/19. Patient will do pre-op over the phone on 03/07/19 and covid testing on 03/10/19 between 12:30-2:30 pm at the [facility]. Pre-surgical instructions will be mailed to the patient.

## 2019-03-07 ENCOUNTER — Inpatient Hospital Stay: Payer: Medicare Other | Attending: Oncology

## 2019-03-07 ENCOUNTER — Other Ambulatory Visit: Payer: Self-pay

## 2019-03-07 ENCOUNTER — Encounter
Admission: RE | Admit: 2019-03-07 | Discharge: 2019-03-07 | Disposition: A | Discharge status: Home / Self Care | Payer: Medicare Other | Source: Ambulatory Visit | Attending: Vascular Surgery | Admitting: Vascular Surgery

## 2019-03-07 ENCOUNTER — Other Ambulatory Visit: Payer: Medicare Other

## 2019-03-07 DIAGNOSIS — R9431 Abnormal electrocardiogram [ECG] [EKG]: Secondary | ICD-10-CM | POA: Diagnosis not present

## 2019-03-07 DIAGNOSIS — D631 Anemia in chronic kidney disease: Secondary | ICD-10-CM | POA: Insufficient documentation

## 2019-03-07 DIAGNOSIS — N185 Chronic kidney disease, stage 5: Secondary | ICD-10-CM | POA: Insufficient documentation

## 2019-03-07 DIAGNOSIS — N189 Chronic kidney disease, unspecified: Secondary | ICD-10-CM

## 2019-03-07 DIAGNOSIS — Z01810 Encounter for preprocedural cardiovascular examination: Secondary | ICD-10-CM | POA: Diagnosis not present

## 2019-03-07 DIAGNOSIS — N186 End stage renal disease: Secondary | ICD-10-CM | POA: Diagnosis not present

## 2019-03-07 DIAGNOSIS — Z01818 Encounter for other preprocedural examination: Secondary | ICD-10-CM | POA: Insufficient documentation

## 2019-03-07 LAB — FERRITIN: Ferritin: 501 ng/mL — ABNORMAL HIGH (ref 24–336)

## 2019-03-07 LAB — CBC WITH DIFFERENTIAL/PLATELET
Abs Immature Granulocytes: 0.02 10*3/uL (ref 0.00–0.07)
Basophils Absolute: 0.1 10*3/uL (ref 0.0–0.1)
Basophils Relative: 1 %
Eosinophils Absolute: 0.1 10*3/uL (ref 0.0–0.5)
Eosinophils Relative: 2 %
HCT: 24.7 % — ABNORMAL LOW (ref 39.0–52.0)
Hemoglobin: 7.7 g/dL — ABNORMAL LOW (ref 13.0–17.0)
Immature Granulocytes: 0 %
Lymphocytes Relative: 15 %
Lymphs Abs: 1.2 10*3/uL (ref 0.7–4.0)
MCH: 31.3 pg (ref 26.0–34.0)
MCHC: 31.2 g/dL (ref 30.0–36.0)
MCV: 100.4 fL — ABNORMAL HIGH (ref 80.0–100.0)
Monocytes Absolute: 0.6 10*3/uL (ref 0.1–1.0)
Monocytes Relative: 7 %
Neutro Abs: 6 10*3/uL (ref 1.7–7.7)
Neutrophils Relative %: 75 %
Platelets: 185 10*3/uL (ref 150–400)
RBC: 2.46 MIL/uL — ABNORMAL LOW (ref 4.22–5.81)
RDW: 14.9 % (ref 11.5–15.5)
WBC: 8 10*3/uL (ref 4.0–10.5)
nRBC: 0 % (ref 0.0–0.2)

## 2019-03-07 LAB — IRON AND TIBC
Iron: 57 ug/dL (ref 45–182)
Saturation Ratios: 22 % (ref 17.9–39.5)
TIBC: 255 ug/dL (ref 250–450)
UIBC: 198 ug/dL

## 2019-03-07 LAB — BASIC METABOLIC PANEL
Anion gap: 12 (ref 5–15)
BUN: 96 mg/dL — ABNORMAL HIGH (ref 8–23)
CO2: 26 mmol/L (ref 22–32)
Calcium: 8.1 mg/dL — ABNORMAL LOW (ref 8.9–10.3)
Chloride: 93 mmol/L — ABNORMAL LOW (ref 98–111)
Creatinine, Ser: 6.66 mg/dL — ABNORMAL HIGH (ref 0.61–1.24)
GFR calc Af Amer: 8 mL/min — ABNORMAL LOW (ref >60)
GFR calc non Af Amer: 7 mL/min — ABNORMAL LOW (ref >60)
Glucose, Bld: 205 mg/dL — ABNORMAL HIGH (ref 70–99)
Potassium: 4.6 mmol/L (ref 3.5–5.1)
Sodium: 131 mmol/L — ABNORMAL LOW (ref 135–145)

## 2019-03-07 LAB — APTT: aPTT: 34 seconds (ref 24–36)

## 2019-03-07 LAB — PROTIME-INR
INR: 1.2 (ref 0.8–1.2)
Prothrombin Time: 15.5 seconds — ABNORMAL HIGH (ref 11.4–15.2)

## 2019-03-07 NOTE — Patient Instructions (Addendum)
Your procedure is scheduled on: 03/12/19 Report to [place]. To find out your arrival time please call [phone] between 1PM - 3PM on 03/11/19.  Remember: Instructions that are not followed completely may result in serious medical risk, up to and including death, or upon the discretion of your surgeon and anesthesiologist your surgery may need to be rescheduled.     _X__ 1. Do not eat food after midnight the night before your procedure.                 No gum chewing or hard candies. You may drink clear liquids up to 2 hours                 before you are scheduled to arrive for your surgery- DO not drink clear                 liquids within 2 hours of the start of your surgery.                 Clear Liquids include:  water, apple juice without pulp, clear carbohydrate                 drink such as Clearfast or Gatorade, Black Coffee or Tea (Do not add                 anything to coffee or tea). Diabetics water only  __X__2.  On the morning of surgery brush your teeth with toothpaste and water, you                 may rinse your mouth with mouthwash if you wish.  Do not swallow any              toothpaste of mouthwash.     _X__ 3.  No Alcohol for 24 hours before or after surgery.   _X__ 4.  Do Not Smoke or use e-cigarettes For 24 Hours Prior to Your Surgery.                 Do not use any chewable tobacco products for at least 6 hours prior to                 surgery.  ____  5.  Bring all medications with you on the day of surgery if instructed.   __X__  6.  Notify your doctor if there is any change in your medical condition      (cold, fever, infections).     Do not wear jewelry, make-up, hairpins, clips or nail polish. Do not wear lotions, powders, or perfumes.  Do not shave 48 hours prior to surgery. Men may shave face and neck. Do not bring valuables to the hospital.    [facility] is not responsible for any belongings or  valuables.  Contacts, dentures/partials or body piercings may not be worn into surgery. Bring a case for your contacts, glasses or hearing aids, a denture cup will be supplied. Leave your suitcase in the car. After surgery it may be brought to your room. For patients admitted to the hospital, discharge time is determined by your treatment team.   Patients discharged the day of surgery will not be allowed to drive home.   Please read over the following fact sheets that you were given:   MRSA Information  __X__ Take these medicines the morning of surgery with A SIP OF WATER:  1. CARVEDILOL  2. ZETIA  3. IMDUR  4. METOPROLOL  5. TAKE NAMENDA IF RX HAS BEEN FILLED  6.  ____ Fleet Enema (as directed)   __X__ Use CHG Soap/SAGE wipes as directed  ____ Use inhalers on the day of surgery  ____ Stop metformin/Janumet/Farxiga 2 days prior to surgery    ____ Take 1/2 of usual insulin dose the night before surgery. No insulin the morning          of surgery.   __X__ Stop Blood Thinners Coumadin/Plavix/Xarelto/Pleta/Pradaxa/Eliquis/Effient/  on   Or contact your Surgeon, Cardiologist or Medical Doctor regarding  ability to stop your blood thinners PER DR DEW'S INSTRUCTIONS PLAVIX SHOULD BE HELD FOR 3 DAYS PRIOR TO YOUR PROCEDURE. YOU MAY CONTINUE DAILY ASPIRIN EXCEPT FOR THE DAY OF SURGERY.  __X__ Stop Anti-inflammatories 7 days before surgery such as Advil, Ibuprofen, Motrin,  BC or Goodies Powder, Naprosyn, Naproxen, Aleve   __X__ Stop all herbal supplements, fish oil or vitamin E until after surgery.  STOP TURMERIC TODAY  ____ Bring C-Pap to the hospital.

## 2019-03-07 NOTE — Pre-Procedure Instructions (Signed)
EKG REVIEWED BY Donata Duff MD   I think its fine to proceed with his current cardiac clearance and workup   SECURE CHAT NOTE ABOVE.

## 2019-03-07 NOTE — Pre-Procedure Instructions (Signed)
Eulogio Ditch NP notified patients pre op  labs available for review.

## 2019-03-09 LAB — TYPE AND SCREEN
ABO/RH(D): A POS
Antibody Screen: NEGATIVE
Extend sample reason: TRANSFUSED

## 2019-03-10 ENCOUNTER — Encounter: Payer: Self-pay | Admitting: Oncology

## 2019-03-10 ENCOUNTER — Inpatient Hospital Stay (HOSPITAL_BASED_OUTPATIENT_CLINIC_OR_DEPARTMENT_OTHER): Payer: Medicare Other | Admitting: Oncology

## 2019-03-10 ENCOUNTER — Other Ambulatory Visit
Admission: RE | Admit: 2019-03-10 | Discharge: 2019-03-10 | Disposition: A | Discharge status: Home / Self Care | Payer: Medicare Other | Source: Ambulatory Visit | Attending: Vascular Surgery | Admitting: Vascular Surgery

## 2019-03-10 ENCOUNTER — Other Ambulatory Visit: Payer: Self-pay

## 2019-03-10 DIAGNOSIS — N185 Chronic kidney disease, stage 5: Secondary | ICD-10-CM | POA: Diagnosis not present

## 2019-03-10 DIAGNOSIS — D631 Anemia in chronic kidney disease: Secondary | ICD-10-CM | POA: Diagnosis not present

## 2019-03-10 DIAGNOSIS — Z79899 Other long term (current) drug therapy: Secondary | ICD-10-CM | POA: Diagnosis not present

## 2019-03-10 DIAGNOSIS — Z01812 Encounter for preprocedural laboratory examination: Secondary | ICD-10-CM | POA: Insufficient documentation

## 2019-03-10 DIAGNOSIS — I2510 Atherosclerotic heart disease of native coronary artery without angina pectoris: Secondary | ICD-10-CM | POA: Diagnosis not present

## 2019-03-10 DIAGNOSIS — D649 Anemia, unspecified: Secondary | ICD-10-CM

## 2019-03-10 DIAGNOSIS — Z20822 Contact with and (suspected) exposure to covid-19: Secondary | ICD-10-CM | POA: Insufficient documentation

## 2019-03-10 NOTE — Progress Notes (Signed)
Patient stated that he sometimes he has SOB on exertion or tiredness and fatigue. Patient denied any blood on his urine or stools.

## 2019-03-11 ENCOUNTER — Other Ambulatory Visit: Payer: Self-pay | Admitting: *Deleted

## 2019-03-11 DIAGNOSIS — D631 Anemia in chronic kidney disease: Secondary | ICD-10-CM

## 2019-03-11 LAB — SARS CORONAVIRUS 2 (TAT 6-24 HRS): SARS Coronavirus 2: NEGATIVE

## 2019-03-11 MED ORDER — CEFAZOLIN SODIUM-DEXTROSE 1-4 GM/50ML-% IV SOLN
1.0000 g | INTRAVENOUS | Status: AC
  Administered 2019-03-12: 2 g via INTRAVENOUS

## 2019-03-11 NOTE — Progress Notes (Signed)
I connected with Ray Lamb on 03/11/19 at 11:30 AM EST by video enabled telemedicine visit and verified that I am speaking with the correct person using two identifiers.   I discussed the limitations, risks, security and privacy concerns of performing an evaluation and management service by telemedicine and the availability of in-person appointments. I also discussed with the patient that there may be a patient responsible charge related to this service. The patient expressed understanding and agreed to proceed.  There were problems during the video connection and the visit had to be switched to a telephone  Other persons participating in the visit and their role in the encounter:  none  Patient's location:  home Provider's location:  work  Chief Complaint:  Discuss results of blood work  Diagnosis: Anemia likely secondary to chronic kidney disease  History of present illness: Patient is a 86-year-old male with a past medical history significant for hypertension hyperlipidemia and diabetes.  He also has chronic kidney disease for which he sees Dr. Singh.  His renal functions have been getting worse and his baseline creatinine up until May to June 2020 was about 2.4-2.9.  On 01/22/2019 his creatinine had gone up to 4.5.  Around the same time his hemoglobin also decreased from his baseline between 9 to 10-6.7 on 01/22/2019.  During his visit in December 2020 there were concerns of early uremia and he was seen by nephrology urgently.  He is getting vascular access done and is likely to start hemodialysis in the next 3 to 4 weeks  Patient had extensive anemia work-up done on 01/24/2019 which showed WBC of 8.6, H&H of 6.2/19.7 and a platelet count of 249.  Creatinine was elevated at 5.04.  B12 and folate were unremarkable.  Ferritin was 80 and iron studies showed a low iron saturation of 14%.  Reticulocyte count was inappropriately low at 4.2% for the degree of anemia.  Multiple myeloma panel showed M  protein of 0.5 g IgM lambda.  TSH was normal in both kappa and lambda free light chains were elevated with a normal kappa lambda light chain ratio of 1.4.  Interval history patient currently complains of ongoing fatigue and bilateral lower extremity swelling as well as exertional shortness of breath.   Review of Systems  Constitutional: Positive for malaise/fatigue. Negative for chills, fever and weight loss.  HENT: Negative for congestion, ear discharge and nosebleeds.   Eyes: Negative for blurred vision.  Respiratory: Positive for shortness of breath. Negative for cough, hemoptysis, sputum production and wheezing.   Cardiovascular: Positive for leg swelling. Negative for chest pain, palpitations, orthopnea and claudication.  Gastrointestinal: Negative for abdominal pain, blood in stool, constipation, diarrhea, heartburn, melena, nausea and vomiting.  Genitourinary: Negative for dysuria, flank pain, frequency, hematuria and urgency.  Musculoskeletal: Negative for back pain, joint pain and myalgias.  Skin: Negative for rash.  Neurological: Negative for dizziness, tingling, focal weakness, seizures, weakness and headaches.  Endo/Heme/Allergies: Does not bruise/bleed easily.  Psychiatric/Behavioral: Negative for depression and suicidal ideas. The patient does not have insomnia.     Allergies  Allergen Reactions  . Bee Venom Hives  . Iodinated Diagnostic Agents Rash  . Metrizamide Rash  . Other Rash and Other (See Comments)    Dye    Past Medical History:  Diagnosis Date  . Anemia   . Chronic kidney disease    STAGE 4  . Coronary atherosclerosis    CABG X 4  . Diabetes mellitus without complication (HCC)   . Edema      ankle  . GERD (gastroesophageal reflux disease)   . Glaucoma   . Hearing loss   . Heart murmur   . History of hiatal hernia   . Hyperlipidemia   . Hypertension   . Mini stroke (HCC)   . Mitral insufficiency   . Myocardial infarction (HCC)    mild, heart cath  done no stents needed  . Stroke (HCC) 2011   TIA    Past Surgical History:  Procedure Laterality Date  . CATARACT EXTRACTION W/PHACO Left 09/05/2016   Procedure: CATARACT EXTRACTION PHACO AND INTRAOCULAR LENS PLACEMENT (IOC);  Surgeon: Porfilio, William, MD;  Location: [facility];  Service: Ophthalmology;  Laterality: Left;  US 00:43.1 AP% 22.6 CDE 9.77 Fluid Pack lot #[id number]  . CATARACT EXTRACTION W/PHACO Right 09/26/2016   Procedure: CATARACT EXTRACTION PHACO AND INTRAOCULAR LENS PLACEMENT (IOC);  Surgeon: Porfilio, William, MD;  Location: [facility];  Service: Ophthalmology;  Laterality: Right;  US 01:00 AP% 16.0 CDE 9.71 Fluid pack lot # [id number]  . CORONARY ARTERY BYPASS GRAFT  2003  . DIALYSIS/PERMA CATHETER INSERTION N/A 10/04/2017   Procedure: DIALYSIS/PERMA CATHETER INSERTION;  Surgeon: Dew, Jason S, MD;  Location: [facility];  Service: Cardiovascular;  Laterality: N/A;  . DIALYSIS/PERMA CATHETER REMOVAL N/A 11/01/2017   Procedure: DIALYSIS/PERMA CATHETER REMOVAL;  Surgeon: Dew, Jason S, MD;  Location: [facility];  Service: Cardiovascular;  Laterality: N/A;  . ESOPHAGOGASTRODUODENOSCOPY (EGD) WITH PROPOFOL N/A 04/25/2016   Procedure: ESOPHAGOGASTRODUODENOSCOPY (EGD) WITH PROPOFOL;  Surgeon: Darren Wohl, MD;  Location: [facility];  Service: Endoscopy;  Laterality: N/A;  . JOINT REPLACEMENT Right 2010   knee  . LEFT HEART CATH AND CORONARY ANGIOGRAPHY N/A 10/04/2017   Procedure: LEFT HEART CATH AND CORONARY ANGIOGRAPHY;  Surgeon: Paraschos, Alexander, MD;  Location: [facility];  Service: Cardiovascular;  Laterality: N/A;  . LEFT HEART CATH AND CORONARY ANGIOGRAPHY Left 07/01/2018   Procedure: LEFT HEART CATH AND CORONARY ANGIOGRAPHY;  Surgeon: Khan, Shaukat A, MD;  Location: [facility];  Service: Cardiovascular;  Laterality: Left;  . SKIN CANCER EXCISION      Social History   Socioeconomic History  . Marital status: Married    Spouse  name: Not on file  . Number of children: Not on file  . Years of education: Not on file  . Highest education level: Not on file  Occupational History  . Not on file  Tobacco Use  . Smoking status: Former Smoker    Quit date: 09/02/1964    Years since quitting: 54.5  . Smokeless tobacco: Never Used  Substance and Sexual Activity  . Alcohol use: No    Alcohol/week: 0.0 standard drinks  . Drug use: No  . Sexual activity: Not on file  Other Topics Concern  . Not on file  Social History Narrative  . Not on file   Social Determinants of Health   Financial Resource Strain:   . Difficulty of Paying Living Expenses: Not on file  Food Insecurity:   . Worried About Running Out of Food in the Last Year: Not on file  . Ran Out of Food in the Last Year: Not on file  Transportation Needs:   . Lack of Transportation (Medical): Not on file  . Lack of Transportation (Non-Medical): Not on file  Physical Activity:   . Days of Exercise per Week: Not on file  . Minutes of Exercise per Session: Not on file  Stress:   . Feeling of Stress : Not   on file  Social Connections:   . Frequency of Communication with Friends and Family: Not on file  . Frequency of Social Gatherings with Friends and Family: Not on file  . Attends Religious Services: Not on file  . Active Member of Clubs or Organizations: Not on file  . Attends Archivist Meetings: Not on file  . Marital Status: Not on file  Intimate Partner Violence:   . Fear of Current or Ex-Partner: Not on file  . Emotionally Abused: Not on file  . Physically Abused: Not on file  . Sexually Abused: Not on file    Family History  Problem Relation Age of Onset  . Stroke Mother   . Diabetes Mother   . Diabetes Sister   . Diabetes Maternal Grandmother      Current Outpatient Medications:  .  ASPIRIN 81 PO, Take by mouth daily., Disp: , Rfl:  .  atorvastatin (LIPITOR) 80 MG tablet, Take 1 tablet (80 mg total) by mouth at bedtime.,  Disp: 90 tablet, Rfl: 4 .  carvedilol (COREG) 12.5 MG tablet, Take by mouth 2 (two) times daily. , Disp: , Rfl:  .  clopidogrel (PLAVIX) 75 MG tablet, TAKE 1 TABLET DAILY (Patient taking differently: 75 mg daily. ), Disp: 90 tablet, Rfl: 3 .  ezetimibe (ZETIA) 10 MG tablet, Take 1 tablet (10 mg total) by mouth daily., Disp: 90 tablet, Rfl: 4 .  isosorbide mononitrate (IMDUR) 30 MG 24 hr tablet, Take 1.5 tablets (45 mg total) by mouth daily., Disp: 135 tablet, Rfl: 1 .  JANUVIA 25 MG tablet, TAKE 1 TABLET BY MOUTH EVERY DAY, Disp: 90 tablet, Rfl: 1 .  Loratadine (CLARITIN PO), Take by mouth daily., Disp: , Rfl:  .  mirtazapine (REMERON) 7.5 MG tablet, Take 7.5 mg by mouth at bedtime., Disp: , Rfl:  .  Multiple Vitamins-Minerals (CENTRUM SILVER ULTRA MENS PO), Take by mouth daily., Disp: , Rfl:  .  timolol (TIMOPTIC) 0.5 % ophthalmic solution, Place 1 drop into both eyes daily. , Disp: , Rfl:  .  TURMERIC PO, Take 1,300 mg by mouth daily. , Disp: , Rfl:  .  donepezil (ARICEPT) 10 MG tablet, Take by mouth daily. , Disp: , Rfl:  .  furosemide (LASIX) 40 MG tablet, Take 40 mg by mouth daily as needed for fluid. , Disp: , Rfl:  .  memantine (NAMENDA) 5 MG tablet, Take 1 tablet by mouth 2 (two) times daily., Disp: , Rfl:  .  ondansetron (ZOFRAN) 4 MG tablet, Take 4 mg by mouth every 8 (eight) hours as needed., Disp: , Rfl:  .  sodium bicarbonate 650 MG tablet, Take by mouth., Disp: , Rfl:  No current facility-administered medications for this visit.  Facility-Administered Medications Ordered in Other Visits:  .  sodium chloride flush (NS) 0.9 % injection 3 mL, 3 mL, Intravenous, Q12H, Jake Bathe, FNP  No results found.  No images are attached to the encounter.   CMP Latest Ref Rng & Units 03/07/2019  Glucose 70 - 99 mg/dL 205(H)  BUN 8 - 23 mg/dL 96(H)  Creatinine 0.61 - 1.24 mg/dL 6.66(H)  Sodium 135 - 145 mmol/L 131(L)  Potassium 3.5 - 5.1 mmol/L 4.6  Chloride 98 - 111 mmol/L 93(L)   CO2 22 - 32 mmol/L 26  Calcium 8.9 - 10.3 mg/dL 8.1(L)  Total Protein 6.5 - 8.1 g/dL -  Total Bilirubin 0.3 - 1.2 mg/dL -  Alkaline Phos 38 - 126 U/L -  AST 15 -  41 U/L -  ALT 0 - 44 U/L -   CBC Latest Ref Rng & Units 03/07/2019  WBC 4.0 - 10.5 K/uL 8.0  Hemoglobin 13.0 - 17.0 g/dL 7.7(L)  Hematocrit 39.0 - 52.0 % 24.7(L)  Platelets 150 - 400 K/uL 185     Assessment and plan: Patient is a 86 year old male referred for anemia likely secondary to anemia of chronic kidney disease  I discussed the results of blood work from 01/24/2019.  He has received 1 unit of PRBC since then and also received 5 doses of Venofer to bring his iron saturations to greater than 20%. Despite that his hemoglobin is currently low at 7.7 I therefore suspect this is secondary to anemia of chronic kidney disease.  Patient is in the process of getting started with dialysis which may take 4 to 6 weeks from now.  Once dialysis is initiated he can receive EPO with nephrology.  In the meanwhile given his significant anemia we would like to proceed with Retacrit 40,000 units every 3 weeks with an aim to keep hemoglobin between 10-11.  Discussed risks and benefits of Retacrit including all but not limited to headache, dizziness and possible risk of thromboembolic event such as heart attacks and strokes.  These side effects are rare when the hemoglobin is able to keep at lower level between 10-11.  Patient understands and agrees to proceed as planned.  We will schedule him for every 3 weeks Retacrit and I will see him back in 3 months with CBC ferritin and iron studies  Follow-up instructions: As above  I discussed the assessment and treatment plan with the patient. The patient was provided an opportunity to ask questions and all were answered. The patient agreed with the plan and demonstrated an understanding of the instructions.   The patient was advised to call back or seek an in-person evaluation if the symptoms worsen or  if the condition fails to improve as anticipated.    Visit Diagnosis: 1. Anemia of chronic kidney failure, stage 5 ([place])   2. Erythropoietin (EPO) stimulating agent anemia management patient     Dr. Randa Evens, MD, MPH [facility] Tel- [phone] 03/11/2019 10:57 AM

## 2019-03-12 ENCOUNTER — Inpatient Hospital Stay: Payer: Medicare Other

## 2019-03-12 ENCOUNTER — Encounter
Admission: RE | Disposition: A | Discharge status: Home / Self Care | Payer: Self-pay | Source: Home / Self Care | Attending: Vascular Surgery

## 2019-03-12 ENCOUNTER — Other Ambulatory Visit: Payer: Self-pay

## 2019-03-12 ENCOUNTER — Ambulatory Visit: Payer: Medicare Other | Admitting: Anesthesiology

## 2019-03-12 ENCOUNTER — Ambulatory Visit
Admission: RE | Admit: 2019-03-12 | Discharge: 2019-03-12 | Disposition: A | Discharge status: Home / Self Care | Payer: Medicare Other | Attending: Vascular Surgery | Admitting: Vascular Surgery

## 2019-03-12 ENCOUNTER — Encounter: Payer: Self-pay | Admitting: Vascular Surgery

## 2019-03-12 DIAGNOSIS — I2510 Atherosclerotic heart disease of native coronary artery without angina pectoris: Secondary | ICD-10-CM | POA: Insufficient documentation

## 2019-03-12 DIAGNOSIS — I132 Hypertensive heart and chronic kidney disease with heart failure and with stage 5 chronic kidney disease, or end stage renal disease: Secondary | ICD-10-CM | POA: Diagnosis not present

## 2019-03-12 DIAGNOSIS — I340 Nonrheumatic mitral (valve) insufficiency: Secondary | ICD-10-CM | POA: Diagnosis not present

## 2019-03-12 DIAGNOSIS — Z7984 Long term (current) use of oral hypoglycemic drugs: Secondary | ICD-10-CM | POA: Insufficient documentation

## 2019-03-12 DIAGNOSIS — N186 End stage renal disease: Secondary | ICD-10-CM | POA: Diagnosis not present

## 2019-03-12 DIAGNOSIS — Z8673 Personal history of transient ischemic attack (TIA), and cerebral infarction without residual deficits: Secondary | ICD-10-CM | POA: Diagnosis not present

## 2019-03-12 DIAGNOSIS — I120 Hypertensive chronic kidney disease with stage 5 chronic kidney disease or end stage renal disease: Secondary | ICD-10-CM | POA: Diagnosis not present

## 2019-03-12 DIAGNOSIS — Z87891 Personal history of nicotine dependence: Secondary | ICD-10-CM | POA: Diagnosis not present

## 2019-03-12 DIAGNOSIS — I252 Old myocardial infarction: Secondary | ICD-10-CM | POA: Diagnosis not present

## 2019-03-12 DIAGNOSIS — E782 Mixed hyperlipidemia: Secondary | ICD-10-CM | POA: Diagnosis not present

## 2019-03-12 DIAGNOSIS — K219 Gastro-esophageal reflux disease without esophagitis: Secondary | ICD-10-CM | POA: Diagnosis not present

## 2019-03-12 DIAGNOSIS — Z951 Presence of aortocoronary bypass graft: Secondary | ICD-10-CM | POA: Insufficient documentation

## 2019-03-12 DIAGNOSIS — E1122 Type 2 diabetes mellitus with diabetic chronic kidney disease: Secondary | ICD-10-CM | POA: Diagnosis not present

## 2019-03-12 DIAGNOSIS — Z79899 Other long term (current) drug therapy: Secondary | ICD-10-CM | POA: Diagnosis not present

## 2019-03-12 DIAGNOSIS — Z7902 Long term (current) use of antithrombotics/antiplatelets: Secondary | ICD-10-CM | POA: Diagnosis not present

## 2019-03-12 DIAGNOSIS — N185 Chronic kidney disease, stage 5: Secondary | ICD-10-CM

## 2019-03-12 DIAGNOSIS — Z7982 Long term (current) use of aspirin: Secondary | ICD-10-CM | POA: Insufficient documentation

## 2019-03-12 DIAGNOSIS — Z992 Dependence on renal dialysis: Secondary | ICD-10-CM | POA: Diagnosis not present

## 2019-03-12 DIAGNOSIS — I5022 Chronic systolic (congestive) heart failure: Secondary | ICD-10-CM | POA: Diagnosis not present

## 2019-03-12 DIAGNOSIS — D649 Anemia, unspecified: Secondary | ICD-10-CM | POA: Insufficient documentation

## 2019-03-12 DIAGNOSIS — R609 Edema, unspecified: Secondary | ICD-10-CM | POA: Insufficient documentation

## 2019-03-12 HISTORY — PX: CAPD INSERTION: SHX5233

## 2019-03-12 LAB — POCT I-STAT, CHEM 8
BUN: 109 mg/dL — ABNORMAL HIGH (ref 8–23)
Calcium, Ion: 1.07 mmol/L — ABNORMAL LOW (ref 1.15–1.40)
Chloride: 95 mmol/L — ABNORMAL LOW (ref 98–111)
Creatinine, Ser: 7.6 mg/dL — ABNORMAL HIGH (ref 0.61–1.24)
Glucose, Bld: 140 mg/dL — ABNORMAL HIGH (ref 70–99)
HCT: 25 % — ABNORMAL LOW (ref 39.0–52.0)
Hemoglobin: 8.5 g/dL — ABNORMAL LOW (ref 13.0–17.0)
Potassium: 4.2 mmol/L (ref 3.5–5.1)
Sodium: 132 mmol/L — ABNORMAL LOW (ref 135–145)
TCO2: 24 mmol/L (ref 22–32)

## 2019-03-12 LAB — GLUCOSE, CAPILLARY
Glucose-Capillary: 138 mg/dL — ABNORMAL HIGH (ref 70–99)
Glucose-Capillary: 149 mg/dL — ABNORMAL HIGH (ref 70–99)

## 2019-03-12 LAB — TYPE AND SCREEN
ABO/RH(D): A POS
Antibody Screen: NEGATIVE

## 2019-03-12 SURGERY — INSERTION, CATHETER, CAPD
Anesthesia: General

## 2019-03-12 MED ORDER — DEXAMETHASONE SODIUM PHOSPHATE 10 MG/ML IJ SOLN
INTRAMUSCULAR | Status: DC | PRN
  Administered 2019-03-12: 5 mg via INTRAVENOUS

## 2019-03-12 MED ORDER — PROPOFOL 10 MG/ML IV BOLUS
INTRAVENOUS | Status: DC | PRN
  Administered 2019-03-12: 40 mg via INTRAVENOUS

## 2019-03-12 MED ORDER — CHLORHEXIDINE GLUCONATE CLOTH 2 % EX PADS
6.0000 | MEDICATED_PAD | Freq: Once | CUTANEOUS | Status: AC
  Administered 2019-03-12: 15:00:00 6 via TOPICAL

## 2019-03-12 MED ORDER — DEXAMETHASONE SODIUM PHOSPHATE 10 MG/ML IJ SOLN
INTRAMUSCULAR | Status: AC
  Filled 2019-03-12: qty 1

## 2019-03-12 MED ORDER — BACITRACIN ZINC 500 UNIT/GM EX OINT
TOPICAL_OINTMENT | CUTANEOUS | Status: DC | PRN
  Administered 2019-03-12: 1 via TOPICAL

## 2019-03-12 MED ORDER — OXYCODONE HCL 5 MG PO TABS: 5.0000 mg | ORAL_TABLET | Freq: Once | ORAL | Status: DC | PRN

## 2019-03-12 MED ORDER — OXYCODONE HCL 5 MG/5ML PO SOLN: 5.0000 mg | Freq: Once | ORAL | Status: DC | PRN

## 2019-03-12 MED ORDER — ROCURONIUM BROMIDE 100 MG/10ML IV SOLN
INTRAVENOUS | Status: DC | PRN
  Administered 2019-03-12: 20 mg via INTRAVENOUS
  Administered 2019-03-12: 10 mg via INTRAVENOUS

## 2019-03-12 MED ORDER — ROCURONIUM BROMIDE 50 MG/5ML IV SOLN
INTRAVENOUS | Status: AC
  Filled 2019-03-12: qty 1

## 2019-03-12 MED ORDER — OXYCODONE HCL 5 MG/5ML PO SOLN: 5.0000 mg | Freq: Once | ORAL | Status: AC | PRN

## 2019-03-12 MED ORDER — SUCCINYLCHOLINE CHLORIDE 20 MG/ML IJ SOLN
INTRAMUSCULAR | Status: DC | PRN
  Administered 2019-03-12: 60 mg via INTRAVENOUS

## 2019-03-12 MED ORDER — TRAMADOL HCL 50 MG PO TABS: 50.0000 mg | ORAL_TABLET | Freq: Four times a day (QID) | ORAL | 0 refills | Status: DC | PRN

## 2019-03-12 MED ORDER — OXYCODONE HCL 5 MG PO TABS
ORAL_TABLET | ORAL | Status: AC
  Filled 2019-03-12: qty 1

## 2019-03-12 MED ORDER — ONDANSETRON HCL 4 MG/2ML IJ SOLN
INTRAMUSCULAR | Status: DC | PRN
  Administered 2019-03-12: 4 mg via INTRAVENOUS

## 2019-03-12 MED ORDER — FAMOTIDINE 20 MG PO TABS
ORAL_TABLET | ORAL | Status: AC
  Administered 2019-03-12: 20 mg via ORAL
  Filled 2019-03-12: qty 1

## 2019-03-12 MED ORDER — ONDANSETRON HCL 4 MG/2ML IJ SOLN
INTRAMUSCULAR | Status: AC
  Filled 2019-03-12: qty 2

## 2019-03-12 MED ORDER — FENTANYL CITRATE (PF) 100 MCG/2ML IJ SOLN
INTRAMUSCULAR | Status: AC
  Filled 2019-03-12: qty 2

## 2019-03-12 MED ORDER — EPHEDRINE SULFATE 50 MG/ML IJ SOLN
INTRAMUSCULAR | Status: DC | PRN
  Administered 2019-03-12: 10 mg via INTRAVENOUS

## 2019-03-12 MED ORDER — ONDANSETRON HCL 4 MG/2ML IJ SOLN: 4.0000 mg | Freq: Once | INTRAMUSCULAR | Status: DC | PRN

## 2019-03-12 MED ORDER — PROPOFOL 10 MG/ML IV BOLUS
INTRAVENOUS | Status: AC
  Filled 2019-03-12: qty 20

## 2019-03-12 MED ORDER — ACETAMINOPHEN 10 MG/ML IV SOLN: 1000.0000 mg | Freq: Once | INTRAVENOUS | Status: DC | PRN

## 2019-03-12 MED ORDER — FENTANYL CITRATE (PF) 100 MCG/2ML IJ SOLN
INTRAMUSCULAR | Status: DC | PRN
  Administered 2019-03-12: 50 ug via INTRAVENOUS

## 2019-03-12 MED ORDER — GLYCOPYRROLATE 0.2 MG/ML IJ SOLN
INTRAMUSCULAR | Status: DC | PRN
  Administered 2019-03-12: .2 mg via INTRAVENOUS

## 2019-03-12 MED ORDER — SODIUM CHLORIDE 0.9 % IV SOLN: INTRAVENOUS | Status: DC

## 2019-03-12 MED ORDER — SUGAMMADEX SODIUM 500 MG/5ML IV SOLN
INTRAVENOUS | Status: DC | PRN
  Administered 2019-03-12: 136 mg via INTRAVENOUS

## 2019-03-12 MED ORDER — FAMOTIDINE 20 MG PO TABS: 20.0000 mg | ORAL_TABLET | Freq: Once | ORAL | Status: AC

## 2019-03-12 MED ORDER — LIDOCAINE HCL (PF) 2 % IJ SOLN
INTRAMUSCULAR | Status: AC
  Filled 2019-03-12: qty 10

## 2019-03-12 MED ORDER — CEFAZOLIN SODIUM-DEXTROSE 1-4 GM/50ML-% IV SOLN
INTRAVENOUS | Status: AC
  Filled 2019-03-12: qty 50

## 2019-03-12 MED ORDER — FENTANYL CITRATE (PF) 100 MCG/2ML IJ SOLN: 25.0000 ug | INTRAMUSCULAR | Status: DC | PRN

## 2019-03-12 MED ORDER — LIDOCAINE HCL (CARDIAC) PF 100 MG/5ML IV SOSY
PREFILLED_SYRINGE | INTRAVENOUS | Status: DC | PRN
  Administered 2019-03-12: 60 mg via INTRAVENOUS

## 2019-03-12 MED ORDER — OXYCODONE HCL 5 MG PO TABS
5.0000 mg | ORAL_TABLET | Freq: Once | ORAL | Status: AC | PRN
  Administered 2019-03-12: 17:00:00 5 mg via ORAL

## 2019-03-12 SURGICAL SUPPLY — 34 items
ADAPTER BETA CAP QUINTON DIALY (ADAPTER) IMPLANT
ADAPTER CATH DIALYSIS 18.75 (CATHETERS) ×2 IMPLANT
BLADE CLIPPER SURG (BLADE) ×2 IMPLANT
CANISTER SUCT 1200ML W/VALVE (MISCELLANEOUS) ×2 IMPLANT
CATH DLYS SWAN NECK 62.5CM (CATHETERS) ×2 IMPLANT
CHLORAPREP W/TINT 26 (MISCELLANEOUS) ×2 IMPLANT
COVER WAND RF STERILE (DRAPES) ×2 IMPLANT
DERMABOND ADVANCED (GAUZE/BANDAGES/DRESSINGS) ×1
DERMABOND ADVANCED .7 DNX12 (GAUZE/BANDAGES/DRESSINGS) ×1 IMPLANT
ELECT CAUTERY BLADE 6.4 (BLADE) ×2 IMPLANT
ELECT REM PT RETURN 9FT ADLT (ELECTROSURGICAL) ×2
ELECTRODE REM PT RTRN 9FT ADLT (ELECTROSURGICAL) ×1 IMPLANT
GLOVE BIO SURGEON STRL SZ7 (GLOVE) ×6 IMPLANT
GLOVE INDICATOR 7.5 STRL GRN (GLOVE) ×2 IMPLANT
GOWN STRL REUS W/ TWL LRG LVL3 (GOWN DISPOSABLE) ×2 IMPLANT
GOWN STRL REUS W/ TWL XL LVL3 (GOWN DISPOSABLE) ×1 IMPLANT
GOWN STRL REUS W/TWL LRG LVL3 (GOWN DISPOSABLE) ×2
GOWN STRL REUS W/TWL XL LVL3 (GOWN DISPOSABLE) ×1
IV NS 500ML (IV SOLUTION) ×1
IV NS 500ML BAXH (IV SOLUTION) ×1 IMPLANT
KIT TURNOVER KIT A (KITS) ×2 IMPLANT
LABEL OR SOLS (LABEL) ×2 IMPLANT
MINICAP W/POVIDONE IODINE SOL (MISCELLANEOUS) ×2 IMPLANT
PACK LAP CHOLECYSTECTOMY (MISCELLANEOUS) ×2 IMPLANT
PENCIL ELECTRO HAND CTR (MISCELLANEOUS) ×2 IMPLANT
SET CYSTO W/LG BORE CLAMP LF (SET/KITS/TRAYS/PACK) ×2 IMPLANT
SET TRANSFER 6 W/TWIST CLAMP 5 (SET/KITS/TRAYS/PACK) ×2 IMPLANT
SET TUBE SMOKE EVAC HIGH FLOW (TUBING) ×2 IMPLANT
SPONGE DRAIN TRACH 4X4 STRL 2S (GAUZE/BANDAGES/DRESSINGS) ×2 IMPLANT
SUT MNCRL AB 4-0 PS2 18 (SUTURE) ×2 IMPLANT
SUT VIC AB 2-0 UR6 27 (SUTURE) ×2 IMPLANT
SUT VICRYL+ 3-0 36IN CT-1 (SUTURE) ×2 IMPLANT
TROCAR XCEL NON-BLD 11X100MML (ENDOMECHANICALS) ×2 IMPLANT
TROCAR XCEL NON-BLD 5MMX100MML (ENDOMECHANICALS) IMPLANT

## 2019-03-12 NOTE — H&P (Signed)
[facility] History & Physical Update  The patient was interviewed and re-examined.  The patient's previous History and Physical has been reviewed and is unchanged.  There is no change in the plan of care. We plan to proceed with the scheduled procedure.  Leotis Pain, MD  03/12/2019, 2:29 PM

## 2019-03-12 NOTE — Anesthesia Postprocedure Evaluation (Signed)
Anesthesia Post Note  Patient: Ray Lamb  Procedure(s) Performed: CONTINUOUS AMBULATORY PERITONEAL DIALYSIS (CAPD) CATHETER (N/A )  Patient location during evaluation: PACU Anesthesia Type: General Level of consciousness: awake and alert Pain management: pain level controlled Vital Signs Assessment: post-procedure vital signs reviewed and stable Respiratory status: spontaneous breathing, nonlabored ventilation, respiratory function stable and patient connected to nasal cannula oxygen Cardiovascular status: blood pressure returned to baseline and stable Postop Assessment: no apparent nausea or vomiting Anesthetic complications: no     Last Vitals:  Vitals:   03/12/19 1611 03/12/19 1626  BP: 134/69 136/64  Pulse: 74 70  Resp: 17 15  Temp: (!) 36.1 C   SpO2: 100% 97%    Last Pain:  Vitals:   03/12/19 1626  PainSc: 2                  Arita Miss

## 2019-03-12 NOTE — Anesthesia Procedure Notes (Signed)
Procedure Name: Intubation Date/Time: 03/12/2019 3:41 PM Performed by: Nelda Marseille, CRNA Pre-anesthesia Checklist: Patient identified, Patient being monitored, Timeout performed, Emergency Drugs available and Suction available Patient Re-evaluated:Patient Re-evaluated prior to induction Oxygen Delivery Method: Circle system utilized Preoxygenation: Pre-oxygenation with 100% oxygen Induction Type: IV induction Ventilation: Mask ventilation without difficulty Laryngoscope Size: Mac, 3 and McGraph Grade View: Grade II Tube type: Oral Tube size: 7.5 mm Number of attempts: 1 Airway Equipment and Method: Stylet Placement Confirmation: ETT inserted through vocal cords under direct vision,  positive ETCO2 and breath sounds checked- equal and bilateral Secured at: 21 cm Tube secured with: Tape Dental Injury: Teeth and Oropharynx as per pre-operative assessment  Difficulty Due To: Difficulty was anticipated

## 2019-03-12 NOTE — Op Note (Signed)
  OPERATIVE NOTE   PROCEDURE: 1. Laparoscopic peritoneal dialysis catheter placement.  PRE-OPERATIVE DIAGNOSIS: 1. Renal failure   POST-OPERATIVE DIAGNOSIS: Same  SURGEON: Leotis Pain, MD  ASSISTANT(S): Hezzie Bump, PA-C  ANESTHESIA: general  ESTIMATED BLOOD LOSS: Minimal   FINDING(S): 1. None  SPECIMEN(S): None  INDICATIONS:  Patient presents with renal failure. The patient has decided to do peritoneal dialysis for his long-term dialysis. Risks and benefits of placement were discussed and he is agreeable to proceed.  Differences between peritoneal dialysis and hemodialysis were discussed.  An assistant was present during the procedure to help facilitate the exposure and expedite the procedure.  DESCRIPTION: After obtaining full informed written consent, the patient was brought back to the operating room and placed supine upon the operating table. The patient received IV antibiotics prior to induction. After obtaining adequate anesthesia, the abdomen was prepped and draped in the standard fashion. The assistant provided retraction and mobilization to help facilitate exposure and expedite the procedure throughout the entire procedure.  This included following suture, using retractors, and optimizing lighting. A small transverse incision was created just to the left of the umbilicus and we dissected down to the fascia and placed a pursestring Vicryl suture. I then entered the peritoneum with an 37mm Optiview trocar placed in the right upper quadrant and insufflated the abdomen with carbon dioxide. I then entered the peritoneum just beside the umbilicus with a trocar and the peritoneal dialysis catheter under direct visualization. The coiled portion of the catheter was parked into the pelvis under direct laparoscopic guidance. The deep cuff was secured to the fascial pursestring suture. A small counterincision was made in the left abdomen and the catheter was brought out this site. The  appropriate distal connectors were placed, and I then placed 500 cc of saline through the catheter into the pelvis. The abdomen was desufflated. Immediately, over 400 cc of effluent returned through the catheter when the bag was placed to gravity. I took one more look with the camera to ensure that the catheter was in the pelvis and it was. The 57mm trocar was then removed. I then closed the incisions with 3-0 Vicryl and 4-0 Monocryl and placed Dermabond as dressing. Dry dressing was placed around the catheter exit site. The patient was then awakened from anesthesia and taken to the recovery room in stable condition having tolerated the procedure well.  COMPLICATIONS: None  CONDITION: None  Leotis Pain, MD 03/12/2019 3:59 PM   This note was created with Dragon Medical transcription system. Any errors in dictation are purely unintentional.

## 2019-03-12 NOTE — Transfer of Care (Signed)
Immediate Anesthesia Transfer of Care Note  Patient: Ray Lamb  Procedure(s) Performed: CONTINUOUS AMBULATORY PERITONEAL DIALYSIS (CAPD) CATHETER (N/A )  Patient Location: PACU  Anesthesia Type:General  Level of Consciousness: awake, alert  and oriented  Airway & Oxygen Therapy: Patient Spontanous Breathing and Patient connected to face mask oxygen  Post-op Assessment: Report given to RN and Post -op Vital signs reviewed and stable  Post vital signs: Reviewed and stable  Last Vitals:  Vitals Value Taken Time  BP 134/69 03/12/19 1611  Temp    Pulse 70 03/12/19 1614  Resp 15 03/12/19 1614  SpO2 100 % 03/12/19 1614  Vitals shown include unvalidated device data.  Last Pain:  Vitals:   03/12/19 1200  PainSc: 0-No pain         Complications: No apparent anesthesia complications

## 2019-03-12 NOTE — Anesthesia Preprocedure Evaluation (Addendum)
Anesthesia Evaluation  Patient identified by MRN, date of birth, ID band Patient awake    Reviewed: Allergy & Precautions, NPO status , Patient's Chart, lab work & pertinent test results, reviewed documented beta blocker date and time   History of Anesthesia Complications Negative for: history of anesthetic complications  Airway Mallampati: III       Dental   Pulmonary neg sleep apnea, neg COPD, Not current smoker, former smoker,           Cardiovascular hypertension, Pt. on medications and Pt. on home beta blockers + Past MI, + CABG, + Peripheral Vascular Disease and +CHF (LVEF 35-40%)  + Valvular Problems/Murmurs (murmur, no tx)      Neuro/Psych Dementia TIA (speech difficulties)   GI/Hepatic GERD  Medicated and Controlled,  Endo/Other  diabetes, Type 2, Oral Hypoglycemic Agents  Renal/GU ESRF and DialysisRenal disease     Musculoskeletal   Abdominal   Peds  Hematology   Anesthesia Other Findings   Reproductive/Obstetrics                            Anesthesia Physical Anesthesia Plan  ASA: IV  Anesthesia Plan: General   Post-op Pain Management:    Induction: Intravenous  PONV Risk Score and Plan: 2 and Ondansetron and Treatment may vary due to age or medical condition  Airway Management Planned: Oral ETT  Additional Equipment:   Intra-op Plan:   Post-operative Plan:   Informed Consent: I have reviewed the patients History and Physical, chart, labs and discussed the procedure including the risks, benefits and alternatives for the proposed anesthesia with the patient or authorized representative who has indicated his/her understanding and acceptance.       Plan Discussed with:   Anesthesia Plan Comments:         Anesthesia Quick Evaluation

## 2019-03-13 ENCOUNTER — Other Ambulatory Visit: Payer: Self-pay

## 2019-03-13 ENCOUNTER — Inpatient Hospital Stay: Payer: Medicare Other | Admitting: *Deleted

## 2019-03-13 ENCOUNTER — Inpatient Hospital Stay: Payer: Medicare Other

## 2019-03-13 DIAGNOSIS — N189 Chronic kidney disease, unspecified: Secondary | ICD-10-CM

## 2019-03-13 DIAGNOSIS — N185 Chronic kidney disease, stage 5: Secondary | ICD-10-CM | POA: Diagnosis not present

## 2019-03-13 DIAGNOSIS — D631 Anemia in chronic kidney disease: Secondary | ICD-10-CM | POA: Diagnosis not present

## 2019-03-13 LAB — HEMOGLOBIN AND HEMATOCRIT, BLOOD
HCT: 24.4 % — ABNORMAL LOW (ref 39.0–52.0)
Hemoglobin: 7.4 g/dL — ABNORMAL LOW (ref 13.0–17.0)

## 2019-03-13 MED ORDER — EPOETIN ALFA-EPBX 40000 UNIT/ML IJ SOLN
40000.0000 [IU] | INTRAMUSCULAR | Status: DC
  Administered 2019-03-13: 40000 [IU] via SUBCUTANEOUS
  Filled 2019-03-13: qty 1

## 2019-03-17 DIAGNOSIS — N2581 Secondary hyperparathyroidism of renal origin: Secondary | ICD-10-CM | POA: Diagnosis not present

## 2019-03-17 DIAGNOSIS — N189 Chronic kidney disease, unspecified: Secondary | ICD-10-CM | POA: Diagnosis not present

## 2019-03-17 DIAGNOSIS — R600 Localized edema: Secondary | ICD-10-CM | POA: Diagnosis not present

## 2019-03-17 DIAGNOSIS — I129 Hypertensive chronic kidney disease with stage 1 through stage 4 chronic kidney disease, or unspecified chronic kidney disease: Secondary | ICD-10-CM | POA: Diagnosis not present

## 2019-03-17 DIAGNOSIS — E1122 Type 2 diabetes mellitus with diabetic chronic kidney disease: Secondary | ICD-10-CM | POA: Diagnosis not present

## 2019-03-17 DIAGNOSIS — N185 Chronic kidney disease, stage 5: Secondary | ICD-10-CM | POA: Diagnosis not present

## 2019-03-20 DIAGNOSIS — G309 Alzheimer's disease, unspecified: Secondary | ICD-10-CM | POA: Diagnosis not present

## 2019-03-20 DIAGNOSIS — D631 Anemia in chronic kidney disease: Secondary | ICD-10-CM | POA: Diagnosis not present

## 2019-03-20 DIAGNOSIS — Z992 Dependence on renal dialysis: Secondary | ICD-10-CM | POA: Diagnosis not present

## 2019-03-20 DIAGNOSIS — F0280 Dementia in other diseases classified elsewhere without behavioral disturbance: Secondary | ICD-10-CM | POA: Diagnosis not present

## 2019-03-20 DIAGNOSIS — I5022 Chronic systolic (congestive) heart failure: Secondary | ICD-10-CM | POA: Diagnosis not present

## 2019-03-20 DIAGNOSIS — I120 Hypertensive chronic kidney disease with stage 5 chronic kidney disease or end stage renal disease: Secondary | ICD-10-CM | POA: Diagnosis not present

## 2019-03-20 DIAGNOSIS — E1122 Type 2 diabetes mellitus with diabetic chronic kidney disease: Secondary | ICD-10-CM | POA: Diagnosis not present

## 2019-03-20 DIAGNOSIS — Z8673 Personal history of transient ischemic attack (TIA), and cerebral infarction without residual deficits: Secondary | ICD-10-CM | POA: Diagnosis not present

## 2019-03-20 DIAGNOSIS — I340 Nonrheumatic mitral (valve) insufficiency: Secondary | ICD-10-CM | POA: Diagnosis not present

## 2019-03-20 DIAGNOSIS — Z951 Presence of aortocoronary bypass graft: Secondary | ICD-10-CM | POA: Diagnosis not present

## 2019-03-20 DIAGNOSIS — N186 End stage renal disease: Secondary | ICD-10-CM | POA: Diagnosis not present

## 2019-03-20 DIAGNOSIS — Z96651 Presence of right artificial knee joint: Secondary | ICD-10-CM | POA: Diagnosis not present

## 2019-03-20 DIAGNOSIS — I2510 Atherosclerotic heart disease of native coronary artery without angina pectoris: Secondary | ICD-10-CM | POA: Diagnosis not present

## 2019-03-24 DIAGNOSIS — E1122 Type 2 diabetes mellitus with diabetic chronic kidney disease: Secondary | ICD-10-CM | POA: Diagnosis not present

## 2019-03-24 DIAGNOSIS — I2510 Atherosclerotic heart disease of native coronary artery without angina pectoris: Secondary | ICD-10-CM | POA: Diagnosis not present

## 2019-03-24 DIAGNOSIS — Z992 Dependence on renal dialysis: Secondary | ICD-10-CM | POA: Diagnosis not present

## 2019-03-24 DIAGNOSIS — I120 Hypertensive chronic kidney disease with stage 5 chronic kidney disease or end stage renal disease: Secondary | ICD-10-CM | POA: Diagnosis not present

## 2019-03-24 DIAGNOSIS — N186 End stage renal disease: Secondary | ICD-10-CM | POA: Diagnosis not present

## 2019-03-24 DIAGNOSIS — D631 Anemia in chronic kidney disease: Secondary | ICD-10-CM | POA: Diagnosis not present

## 2019-03-26 DIAGNOSIS — Z992 Dependence on renal dialysis: Secondary | ICD-10-CM | POA: Diagnosis not present

## 2019-03-26 DIAGNOSIS — D631 Anemia in chronic kidney disease: Secondary | ICD-10-CM | POA: Diagnosis not present

## 2019-03-26 DIAGNOSIS — I2510 Atherosclerotic heart disease of native coronary artery without angina pectoris: Secondary | ICD-10-CM | POA: Diagnosis not present

## 2019-03-26 DIAGNOSIS — I120 Hypertensive chronic kidney disease with stage 5 chronic kidney disease or end stage renal disease: Secondary | ICD-10-CM | POA: Diagnosis not present

## 2019-03-26 DIAGNOSIS — N186 End stage renal disease: Secondary | ICD-10-CM | POA: Diagnosis not present

## 2019-03-26 DIAGNOSIS — E1122 Type 2 diabetes mellitus with diabetic chronic kidney disease: Secondary | ICD-10-CM | POA: Diagnosis not present

## 2019-03-28 DIAGNOSIS — I120 Hypertensive chronic kidney disease with stage 5 chronic kidney disease or end stage renal disease: Secondary | ICD-10-CM | POA: Diagnosis not present

## 2019-03-28 DIAGNOSIS — E1122 Type 2 diabetes mellitus with diabetic chronic kidney disease: Secondary | ICD-10-CM | POA: Diagnosis not present

## 2019-03-28 DIAGNOSIS — N186 End stage renal disease: Secondary | ICD-10-CM | POA: Diagnosis not present

## 2019-03-28 DIAGNOSIS — D631 Anemia in chronic kidney disease: Secondary | ICD-10-CM | POA: Diagnosis not present

## 2019-03-28 DIAGNOSIS — I2510 Atherosclerotic heart disease of native coronary artery without angina pectoris: Secondary | ICD-10-CM | POA: Diagnosis not present

## 2019-03-28 DIAGNOSIS — Z992 Dependence on renal dialysis: Secondary | ICD-10-CM | POA: Diagnosis not present

## 2019-04-01 DIAGNOSIS — N2581 Secondary hyperparathyroidism of renal origin: Secondary | ICD-10-CM | POA: Diagnosis not present

## 2019-04-01 DIAGNOSIS — E871 Hypo-osmolality and hyponatremia: Secondary | ICD-10-CM | POA: Diagnosis not present

## 2019-04-01 DIAGNOSIS — R600 Localized edema: Secondary | ICD-10-CM | POA: Diagnosis not present

## 2019-04-01 DIAGNOSIS — I129 Hypertensive chronic kidney disease with stage 1 through stage 4 chronic kidney disease, or unspecified chronic kidney disease: Secondary | ICD-10-CM | POA: Diagnosis not present

## 2019-04-01 DIAGNOSIS — N400 Enlarged prostate without lower urinary tract symptoms: Secondary | ICD-10-CM | POA: Diagnosis not present

## 2019-04-01 DIAGNOSIS — N189 Chronic kidney disease, unspecified: Secondary | ICD-10-CM | POA: Diagnosis not present

## 2019-04-01 DIAGNOSIS — E1122 Type 2 diabetes mellitus with diabetic chronic kidney disease: Secondary | ICD-10-CM | POA: Diagnosis not present

## 2019-04-01 DIAGNOSIS — N185 Chronic kidney disease, stage 5: Secondary | ICD-10-CM | POA: Diagnosis not present

## 2019-04-02 ENCOUNTER — Other Ambulatory Visit: Payer: Self-pay

## 2019-04-02 ENCOUNTER — Inpatient Hospital Stay: Payer: Medicare Other

## 2019-04-02 ENCOUNTER — Inpatient Hospital Stay: Payer: Medicare Other | Attending: Oncology

## 2019-04-02 VITALS — BP 133/74 | HR 67

## 2019-04-02 DIAGNOSIS — E611 Iron deficiency: Secondary | ICD-10-CM | POA: Insufficient documentation

## 2019-04-02 DIAGNOSIS — D631 Anemia in chronic kidney disease: Secondary | ICD-10-CM

## 2019-04-02 DIAGNOSIS — E1122 Type 2 diabetes mellitus with diabetic chronic kidney disease: Secondary | ICD-10-CM | POA: Insufficient documentation

## 2019-04-02 DIAGNOSIS — E119 Type 2 diabetes mellitus without complications: Secondary | ICD-10-CM | POA: Diagnosis not present

## 2019-04-02 DIAGNOSIS — I129 Hypertensive chronic kidney disease with stage 1 through stage 4 chronic kidney disease, or unspecified chronic kidney disease: Secondary | ICD-10-CM | POA: Diagnosis not present

## 2019-04-02 DIAGNOSIS — D6489 Other specified anemias: Secondary | ICD-10-CM | POA: Diagnosis not present

## 2019-04-02 DIAGNOSIS — N189 Chronic kidney disease, unspecified: Secondary | ICD-10-CM | POA: Insufficient documentation

## 2019-04-02 LAB — HEMOGLOBIN AND HEMATOCRIT, BLOOD
HCT: 27 % — ABNORMAL LOW (ref 39.0–52.0)
Hemoglobin: 8.2 g/dL — ABNORMAL LOW (ref 13.0–17.0)

## 2019-04-02 LAB — HM DIABETES EYE EXAM

## 2019-04-02 MED ORDER — EPOETIN ALFA-EPBX 40000 UNIT/ML IJ SOLN
40000.0000 [IU] | INTRAMUSCULAR | Status: DC
  Administered 2019-04-02: 40000 [IU] via SUBCUTANEOUS
  Filled 2019-04-02: qty 1

## 2019-04-04 ENCOUNTER — Other Ambulatory Visit: Payer: Self-pay

## 2019-04-04 ENCOUNTER — Encounter: Payer: Self-pay | Admitting: Family Medicine

## 2019-04-04 ENCOUNTER — Ambulatory Visit (INDEPENDENT_AMBULATORY_CARE_PROVIDER_SITE_OTHER): Payer: Medicare Other | Admitting: Family Medicine

## 2019-04-04 VITALS — BP 136/77 | HR 73 | Temp 97.5°F

## 2019-04-04 DIAGNOSIS — I2510 Atherosclerotic heart disease of native coronary artery without angina pectoris: Secondary | ICD-10-CM | POA: Diagnosis not present

## 2019-04-04 DIAGNOSIS — I120 Hypertensive chronic kidney disease with stage 5 chronic kidney disease or end stage renal disease: Secondary | ICD-10-CM | POA: Diagnosis not present

## 2019-04-04 DIAGNOSIS — N185 Chronic kidney disease, stage 5: Secondary | ICD-10-CM | POA: Diagnosis not present

## 2019-04-04 DIAGNOSIS — E1122 Type 2 diabetes mellitus with diabetic chronic kidney disease: Secondary | ICD-10-CM

## 2019-04-04 DIAGNOSIS — D631 Anemia in chronic kidney disease: Secondary | ICD-10-CM | POA: Diagnosis not present

## 2019-04-04 DIAGNOSIS — N186 End stage renal disease: Secondary | ICD-10-CM | POA: Diagnosis not present

## 2019-04-04 DIAGNOSIS — Z992 Dependence on renal dialysis: Secondary | ICD-10-CM | POA: Diagnosis not present

## 2019-04-04 LAB — BAYER DCA HB A1C WAIVED: HB A1C (BAYER DCA - WAIVED): 5.6 % (ref <7.0)

## 2019-04-04 NOTE — Assessment & Plan Note (Signed)
Doing great with A1c of 5.6 off glypizine. Will stop his [place] and recheck 3 months. Continue to monitor. Call with any concerns.

## 2019-04-04 NOTE — Progress Notes (Signed)
BP 136/77 (BP Location: Left Arm, Patient Position: Sitting, Cuff Size: Small)   Pulse 73   Temp (!) 97.5 F (36.4 C) (Oral)   SpO2 96%    Subjective:    Patient ID: Ray Lamb, male    DOB: May 24, 1930, 86 y.o.   MRN: [id number]  HPI: Ray Lamb is a 86 y.o. male  Chief Complaint  Patient presents with  . Diabetes   DIABETES- has been feeling well. Hasn't been checking his sugars Hypoglycemic episodes:no Polydipsia/polyuria: no Visual disturbance: no Chest pain: no Paresthesias: no Glucose Monitoring: no  Accucheck frequency: Not Checking Taking Insulin?: no Blood Pressure Monitoring: a few times a month Retinal Examination: Up to Date Foot Exam:Not  Up to Date Diabetic Education: Not Completed Pneumovax: Up to Date Influenza: Up to Date Aspirin: yes  Relevant past medical, surgical, family and social history reviewed and updated as indicated. Interim medical history since our last visit reviewed. Allergies and medications reviewed and updated.  Review of Systems  Constitutional: Negative.   Respiratory: Negative.   Cardiovascular: Negative.   Musculoskeletal: Negative.   Neurological: Positive for weakness. Negative for dizziness, tremors, seizures, syncope, facial asymmetry, speech difficulty, light-headedness, numbness and headaches.  Psychiatric/Behavioral: Negative.     Per HPI unless specifically indicated above     Objective:    BP 136/77 (BP Location: Left Arm, Patient Position: Sitting, Cuff Size: Small)   Pulse 73   Temp (!) 97.5 F (36.4 C) (Oral)   SpO2 96%   Wt Readings from Last 3 Encounters:  03/07/19 150 lb (68 kg)  02/25/19 150 lb (68 kg)  02/20/19 145 lb (65.8 kg)    Physical Exam Vitals and nursing note reviewed.  Constitutional:      General: He is not in acute distress.    Appearance: Normal appearance. He is not ill-appearing, toxic-appearing or diaphoretic.  HENT:     Head: Normocephalic and atraumatic.     Right Ear:  External ear normal.     Left Ear: External ear normal.     Nose: Nose normal.     Mouth/Throat:     Mouth: Mucous membranes are moist.     Pharynx: Oropharynx is clear.  Eyes:     General: No scleral icterus.       Right eye: No discharge.        Left eye: No discharge.     Extraocular Movements: Extraocular movements intact.     Conjunctiva/sclera: Conjunctivae normal.     Pupils: Pupils are equal, round, and reactive to light.  Cardiovascular:     Rate and Rhythm: Normal rate and regular rhythm.     Pulses: Normal pulses.     Heart sounds: Normal heart sounds. No murmur. No friction rub. No gallop.   Pulmonary:     Effort: Pulmonary effort is normal. No respiratory distress.     Breath sounds: Normal breath sounds. No stridor. No wheezing, rhonchi or rales.  Chest:     Chest wall: No tenderness.  Musculoskeletal:        General: Normal range of motion.     Cervical back: Normal range of motion and neck supple.  Skin:    General: Skin is warm and dry.     Capillary Refill: Capillary refill takes less than 2 seconds.     Coloration: Skin is not jaundiced or pale.     Findings: No bruising, erythema, lesion or rash.  Neurological:     General: No focal  deficit present.     Mental Status: He is alert and oriented to person, place, and time. Mental status is at baseline.  Psychiatric:        Mood and Affect: Mood normal.        Behavior: Behavior normal.        Thought Content: Thought content normal.        Judgment: Judgment normal.     Results for orders placed or performed in visit on 04/02/19  Hemoglobin and Hematocrit, Blood  Result Value Ref Range   Hemoglobin 8.2 (L) 13.0 - 17.0 g/dL   HCT 27.0 (L) 39.0 - 52.0 %      Assessment & Plan:   Problem List Items Addressed This Visit      Endocrine   Diabetes ([place]) - Primary    Doing great with A1c of 5.6 off glypizine. Will stop his [place] and recheck 3 months. Continue to monitor. Call with any concerns.         Relevant Orders   Bayer DCA Hb A1c Waived       Follow up plan: Return in about 3 months (around 07/02/2019).

## 2019-04-07 ENCOUNTER — Telehealth: Payer: Self-pay

## 2019-04-07 ENCOUNTER — Telehealth: Payer: Self-pay | Admitting: Oncology

## 2019-04-07 DIAGNOSIS — N2581 Secondary hyperparathyroidism of renal origin: Secondary | ICD-10-CM | POA: Diagnosis not present

## 2019-04-07 DIAGNOSIS — E559 Vitamin D deficiency, unspecified: Secondary | ICD-10-CM | POA: Diagnosis not present

## 2019-04-07 DIAGNOSIS — D509 Iron deficiency anemia, unspecified: Secondary | ICD-10-CM | POA: Diagnosis not present

## 2019-04-07 DIAGNOSIS — N186 End stage renal disease: Secondary | ICD-10-CM | POA: Diagnosis not present

## 2019-04-07 DIAGNOSIS — Z992 Dependence on renal dialysis: Secondary | ICD-10-CM | POA: Diagnosis not present

## 2019-04-07 DIAGNOSIS — E538 Deficiency of other specified B group vitamins: Secondary | ICD-10-CM | POA: Diagnosis not present

## 2019-04-07 NOTE — Telephone Encounter (Signed)
Patient will be getting his injections with dialysis. Injection appts have been cancelled. Patient to next see MD on 06-03-19. Patient is aware of changes and next appt date/time.

## 2019-04-07 NOTE — Telephone Encounter (Signed)
Called Dr. Jearld Shines office to ask them if patient started dialysis and I was told to call [phone]. I then called them and was told that patient started his dialysis today-04/07/19. I also asked Addyson, RN if Dr. Aleene Davidson would take over the EPO. I had to leave a message and Addyson will call me back.

## 2019-04-07 NOTE — Telephone Encounter (Signed)
-----   Message from Sindy Guadeloupe, MD sent at 04/07/2019  8:39 AM EST ----- Has he started dialysis yet? We need to know when nephrology will take over his EPO.

## 2019-04-07 NOTE — Telephone Encounter (Signed)
Cancel all epo at this time. He will keep his appointment with me in April as scheduled

## 2019-04-07 NOTE — Telephone Encounter (Signed)
Addyson-RN called me back to let us know that [facility] will be the ones taking over patient's EPO injections.

## 2019-04-08 DIAGNOSIS — N186 End stage renal disease: Secondary | ICD-10-CM | POA: Diagnosis not present

## 2019-04-08 DIAGNOSIS — E559 Vitamin D deficiency, unspecified: Secondary | ICD-10-CM | POA: Diagnosis not present

## 2019-04-08 DIAGNOSIS — E538 Deficiency of other specified B group vitamins: Secondary | ICD-10-CM | POA: Diagnosis not present

## 2019-04-08 DIAGNOSIS — N2581 Secondary hyperparathyroidism of renal origin: Secondary | ICD-10-CM | POA: Diagnosis not present

## 2019-04-08 DIAGNOSIS — E119 Type 2 diabetes mellitus without complications: Secondary | ICD-10-CM | POA: Diagnosis not present

## 2019-04-08 DIAGNOSIS — I259 Chronic ischemic heart disease, unspecified: Secondary | ICD-10-CM | POA: Diagnosis not present

## 2019-04-08 DIAGNOSIS — Z992 Dependence on renal dialysis: Secondary | ICD-10-CM | POA: Diagnosis not present

## 2019-04-08 DIAGNOSIS — D509 Iron deficiency anemia, unspecified: Secondary | ICD-10-CM | POA: Diagnosis not present

## 2019-04-09 DIAGNOSIS — E538 Deficiency of other specified B group vitamins: Secondary | ICD-10-CM | POA: Diagnosis not present

## 2019-04-09 DIAGNOSIS — Z992 Dependence on renal dialysis: Secondary | ICD-10-CM | POA: Diagnosis not present

## 2019-04-09 DIAGNOSIS — E559 Vitamin D deficiency, unspecified: Secondary | ICD-10-CM | POA: Diagnosis not present

## 2019-04-09 DIAGNOSIS — N186 End stage renal disease: Secondary | ICD-10-CM | POA: Diagnosis not present

## 2019-04-09 DIAGNOSIS — N2581 Secondary hyperparathyroidism of renal origin: Secondary | ICD-10-CM | POA: Diagnosis not present

## 2019-04-09 DIAGNOSIS — D509 Iron deficiency anemia, unspecified: Secondary | ICD-10-CM | POA: Diagnosis not present

## 2019-04-10 DIAGNOSIS — N186 End stage renal disease: Secondary | ICD-10-CM | POA: Diagnosis not present

## 2019-04-10 DIAGNOSIS — Z992 Dependence on renal dialysis: Secondary | ICD-10-CM | POA: Diagnosis not present

## 2019-04-11 DIAGNOSIS — Z992 Dependence on renal dialysis: Secondary | ICD-10-CM | POA: Diagnosis not present

## 2019-04-11 DIAGNOSIS — N186 End stage renal disease: Secondary | ICD-10-CM | POA: Diagnosis not present

## 2019-04-13 DIAGNOSIS — Z992 Dependence on renal dialysis: Secondary | ICD-10-CM | POA: Diagnosis not present

## 2019-04-13 DIAGNOSIS — N186 End stage renal disease: Secondary | ICD-10-CM | POA: Diagnosis not present

## 2019-04-14 DIAGNOSIS — D631 Anemia in chronic kidney disease: Secondary | ICD-10-CM | POA: Diagnosis not present

## 2019-04-14 DIAGNOSIS — N186 End stage renal disease: Secondary | ICD-10-CM | POA: Diagnosis not present

## 2019-04-14 DIAGNOSIS — Z992 Dependence on renal dialysis: Secondary | ICD-10-CM | POA: Diagnosis not present

## 2019-04-14 DIAGNOSIS — Z298 Encounter for other specified prophylactic measures: Secondary | ICD-10-CM | POA: Diagnosis not present

## 2019-04-14 DIAGNOSIS — D509 Iron deficiency anemia, unspecified: Secondary | ICD-10-CM | POA: Diagnosis not present

## 2019-04-16 ENCOUNTER — Ambulatory Visit: Payer: Self-pay

## 2019-04-16 DIAGNOSIS — D509 Iron deficiency anemia, unspecified: Secondary | ICD-10-CM | POA: Diagnosis not present

## 2019-04-16 DIAGNOSIS — N186 End stage renal disease: Secondary | ICD-10-CM | POA: Diagnosis not present

## 2019-04-16 DIAGNOSIS — Z298 Encounter for other specified prophylactic measures: Secondary | ICD-10-CM | POA: Diagnosis not present

## 2019-04-16 DIAGNOSIS — Z992 Dependence on renal dialysis: Secondary | ICD-10-CM | POA: Diagnosis not present

## 2019-04-16 DIAGNOSIS — D631 Anemia in chronic kidney disease: Secondary | ICD-10-CM | POA: Diagnosis not present

## 2019-04-16 NOTE — Chronic Care Management (AMB) (Signed)
  Care Management   Follow Up Note   04/16/2019 Name: ENRICO EADDY MRN: [id number] DOB: 1930-09-28  Referred by: [name], MD Reason for referral : [place] is a 86 y.o. year old male who is a primary care patient of Crissman, Jeannette How, MD. The care management team was consulted for assistance with care management and care coordination needs.    Review of patient status, including review of consultants reports, relevant laboratory and other test results, and collaboration with appropriate care team members and the patient's provider was performed as part of comprehensive patient evaluation and provision of chronic care management services.    LCSW completed CCM outreach attempt today but was unable to reach patient successfully. A HIPPA compliant voice message was left encouraging patient to return call once available. LCSW rescheduled CCM SW appointment as well.  A HIPPA compliant phone message was left for the patient providing contact information and requesting a return call.   Eula Fried, BSW, MSW, [facility] [email or web address] Phone: [phone]

## 2019-04-17 DIAGNOSIS — Z992 Dependence on renal dialysis: Secondary | ICD-10-CM | POA: Diagnosis not present

## 2019-04-17 DIAGNOSIS — N186 End stage renal disease: Secondary | ICD-10-CM | POA: Diagnosis not present

## 2019-04-17 DIAGNOSIS — Z298 Encounter for other specified prophylactic measures: Secondary | ICD-10-CM | POA: Diagnosis not present

## 2019-04-17 DIAGNOSIS — D509 Iron deficiency anemia, unspecified: Secondary | ICD-10-CM | POA: Diagnosis not present

## 2019-04-17 DIAGNOSIS — D631 Anemia in chronic kidney disease: Secondary | ICD-10-CM | POA: Diagnosis not present

## 2019-04-18 DIAGNOSIS — Z298 Encounter for other specified prophylactic measures: Secondary | ICD-10-CM | POA: Diagnosis not present

## 2019-04-18 DIAGNOSIS — N186 End stage renal disease: Secondary | ICD-10-CM | POA: Diagnosis not present

## 2019-04-18 DIAGNOSIS — D509 Iron deficiency anemia, unspecified: Secondary | ICD-10-CM | POA: Diagnosis not present

## 2019-04-18 DIAGNOSIS — Z992 Dependence on renal dialysis: Secondary | ICD-10-CM | POA: Diagnosis not present

## 2019-04-18 DIAGNOSIS — D631 Anemia in chronic kidney disease: Secondary | ICD-10-CM | POA: Diagnosis not present

## 2019-04-21 DIAGNOSIS — Z992 Dependence on renal dialysis: Secondary | ICD-10-CM | POA: Diagnosis not present

## 2019-04-21 DIAGNOSIS — Z298 Encounter for other specified prophylactic measures: Secondary | ICD-10-CM | POA: Diagnosis not present

## 2019-04-21 DIAGNOSIS — D631 Anemia in chronic kidney disease: Secondary | ICD-10-CM | POA: Diagnosis not present

## 2019-04-21 DIAGNOSIS — D509 Iron deficiency anemia, unspecified: Secondary | ICD-10-CM | POA: Diagnosis not present

## 2019-04-21 DIAGNOSIS — N186 End stage renal disease: Secondary | ICD-10-CM | POA: Diagnosis not present

## 2019-04-22 DIAGNOSIS — N186 End stage renal disease: Secondary | ICD-10-CM | POA: Diagnosis not present

## 2019-04-22 DIAGNOSIS — Z298 Encounter for other specified prophylactic measures: Secondary | ICD-10-CM | POA: Diagnosis not present

## 2019-04-22 DIAGNOSIS — Z992 Dependence on renal dialysis: Secondary | ICD-10-CM | POA: Diagnosis not present

## 2019-04-22 DIAGNOSIS — D509 Iron deficiency anemia, unspecified: Secondary | ICD-10-CM | POA: Diagnosis not present

## 2019-04-22 DIAGNOSIS — D631 Anemia in chronic kidney disease: Secondary | ICD-10-CM | POA: Diagnosis not present

## 2019-04-23 ENCOUNTER — Inpatient Hospital Stay: Payer: Medicare Other

## 2019-04-23 DIAGNOSIS — Z298 Encounter for other specified prophylactic measures: Secondary | ICD-10-CM | POA: Diagnosis not present

## 2019-04-23 DIAGNOSIS — D509 Iron deficiency anemia, unspecified: Secondary | ICD-10-CM | POA: Diagnosis not present

## 2019-04-23 DIAGNOSIS — Z992 Dependence on renal dialysis: Secondary | ICD-10-CM | POA: Diagnosis not present

## 2019-04-23 DIAGNOSIS — D631 Anemia in chronic kidney disease: Secondary | ICD-10-CM | POA: Diagnosis not present

## 2019-04-23 DIAGNOSIS — N186 End stage renal disease: Secondary | ICD-10-CM | POA: Diagnosis not present

## 2019-04-25 DIAGNOSIS — Z992 Dependence on renal dialysis: Secondary | ICD-10-CM | POA: Diagnosis not present

## 2019-04-25 DIAGNOSIS — D631 Anemia in chronic kidney disease: Secondary | ICD-10-CM | POA: Diagnosis not present

## 2019-04-25 DIAGNOSIS — N186 End stage renal disease: Secondary | ICD-10-CM | POA: Diagnosis not present

## 2019-04-25 DIAGNOSIS — Z298 Encounter for other specified prophylactic measures: Secondary | ICD-10-CM | POA: Diagnosis not present

## 2019-04-25 DIAGNOSIS — D509 Iron deficiency anemia, unspecified: Secondary | ICD-10-CM | POA: Diagnosis not present

## 2019-04-28 ENCOUNTER — Other Ambulatory Visit: Payer: Self-pay

## 2019-04-28 DIAGNOSIS — E7800 Pure hypercholesterolemia, unspecified: Secondary | ICD-10-CM

## 2019-04-28 DIAGNOSIS — D631 Anemia in chronic kidney disease: Secondary | ICD-10-CM | POA: Diagnosis not present

## 2019-04-28 DIAGNOSIS — Z298 Encounter for other specified prophylactic measures: Secondary | ICD-10-CM | POA: Diagnosis not present

## 2019-04-28 DIAGNOSIS — Z992 Dependence on renal dialysis: Secondary | ICD-10-CM | POA: Diagnosis not present

## 2019-04-28 DIAGNOSIS — D509 Iron deficiency anemia, unspecified: Secondary | ICD-10-CM | POA: Diagnosis not present

## 2019-04-28 DIAGNOSIS — N186 End stage renal disease: Secondary | ICD-10-CM | POA: Diagnosis not present

## 2019-04-28 MED ORDER — EZETIMIBE 10 MG PO TABS: 10.0000 mg | ORAL_TABLET | Freq: Every day | ORAL | 1 refills | Status: DC

## 2019-04-28 NOTE — Telephone Encounter (Signed)
LOV: 04/04/2019, NOV: 07/03/2019 both with Park Liter, DO.

## 2019-04-29 DIAGNOSIS — Z992 Dependence on renal dialysis: Secondary | ICD-10-CM | POA: Diagnosis not present

## 2019-04-29 DIAGNOSIS — Z298 Encounter for other specified prophylactic measures: Secondary | ICD-10-CM | POA: Diagnosis not present

## 2019-04-29 DIAGNOSIS — D509 Iron deficiency anemia, unspecified: Secondary | ICD-10-CM | POA: Diagnosis not present

## 2019-04-29 DIAGNOSIS — D631 Anemia in chronic kidney disease: Secondary | ICD-10-CM | POA: Diagnosis not present

## 2019-04-29 DIAGNOSIS — N186 End stage renal disease: Secondary | ICD-10-CM | POA: Diagnosis not present

## 2019-04-30 DIAGNOSIS — D509 Iron deficiency anemia, unspecified: Secondary | ICD-10-CM | POA: Diagnosis not present

## 2019-04-30 DIAGNOSIS — Z298 Encounter for other specified prophylactic measures: Secondary | ICD-10-CM | POA: Diagnosis not present

## 2019-04-30 DIAGNOSIS — Z992 Dependence on renal dialysis: Secondary | ICD-10-CM | POA: Diagnosis not present

## 2019-04-30 DIAGNOSIS — N186 End stage renal disease: Secondary | ICD-10-CM | POA: Diagnosis not present

## 2019-04-30 DIAGNOSIS — D631 Anemia in chronic kidney disease: Secondary | ICD-10-CM | POA: Diagnosis not present

## 2019-05-01 DIAGNOSIS — N186 End stage renal disease: Secondary | ICD-10-CM | POA: Diagnosis not present

## 2019-05-01 DIAGNOSIS — Z992 Dependence on renal dialysis: Secondary | ICD-10-CM | POA: Diagnosis not present

## 2019-05-02 DIAGNOSIS — Z992 Dependence on renal dialysis: Secondary | ICD-10-CM | POA: Diagnosis not present

## 2019-05-02 DIAGNOSIS — N186 End stage renal disease: Secondary | ICD-10-CM | POA: Diagnosis not present

## 2019-05-03 DIAGNOSIS — Z992 Dependence on renal dialysis: Secondary | ICD-10-CM | POA: Diagnosis not present

## 2019-05-03 DIAGNOSIS — N186 End stage renal disease: Secondary | ICD-10-CM | POA: Diagnosis not present

## 2019-05-04 DIAGNOSIS — N186 End stage renal disease: Secondary | ICD-10-CM | POA: Diagnosis not present

## 2019-05-04 DIAGNOSIS — Z992 Dependence on renal dialysis: Secondary | ICD-10-CM | POA: Diagnosis not present

## 2019-05-05 DIAGNOSIS — Z992 Dependence on renal dialysis: Secondary | ICD-10-CM | POA: Diagnosis not present

## 2019-05-05 DIAGNOSIS — N186 End stage renal disease: Secondary | ICD-10-CM | POA: Diagnosis not present

## 2019-05-06 DIAGNOSIS — Z992 Dependence on renal dialysis: Secondary | ICD-10-CM | POA: Diagnosis not present

## 2019-05-06 DIAGNOSIS — N186 End stage renal disease: Secondary | ICD-10-CM | POA: Diagnosis not present

## 2019-05-07 DIAGNOSIS — Z992 Dependence on renal dialysis: Secondary | ICD-10-CM | POA: Diagnosis not present

## 2019-05-07 DIAGNOSIS — N186 End stage renal disease: Secondary | ICD-10-CM | POA: Diagnosis not present

## 2019-05-08 DIAGNOSIS — N186 End stage renal disease: Secondary | ICD-10-CM | POA: Diagnosis not present

## 2019-05-08 DIAGNOSIS — Z992 Dependence on renal dialysis: Secondary | ICD-10-CM | POA: Diagnosis not present

## 2019-05-09 DIAGNOSIS — I10 Essential (primary) hypertension: Secondary | ICD-10-CM | POA: Diagnosis not present

## 2019-05-09 DIAGNOSIS — E785 Hyperlipidemia, unspecified: Secondary | ICD-10-CM | POA: Diagnosis not present

## 2019-05-09 DIAGNOSIS — I2510 Atherosclerotic heart disease of native coronary artery without angina pectoris: Secondary | ICD-10-CM | POA: Diagnosis not present

## 2019-05-09 DIAGNOSIS — Z992 Dependence on renal dialysis: Secondary | ICD-10-CM | POA: Diagnosis not present

## 2019-05-09 DIAGNOSIS — K219 Gastro-esophageal reflux disease without esophagitis: Secondary | ICD-10-CM | POA: Diagnosis not present

## 2019-05-09 DIAGNOSIS — I25810 Atherosclerosis of coronary artery bypass graft(s) without angina pectoris: Secondary | ICD-10-CM | POA: Diagnosis not present

## 2019-05-09 DIAGNOSIS — I340 Nonrheumatic mitral (valve) insufficiency: Secondary | ICD-10-CM | POA: Diagnosis not present

## 2019-05-09 DIAGNOSIS — N186 End stage renal disease: Secondary | ICD-10-CM | POA: Diagnosis not present

## 2019-05-09 DIAGNOSIS — R609 Edema, unspecified: Secondary | ICD-10-CM | POA: Diagnosis not present

## 2019-05-10 DIAGNOSIS — Z992 Dependence on renal dialysis: Secondary | ICD-10-CM | POA: Diagnosis not present

## 2019-05-10 DIAGNOSIS — N186 End stage renal disease: Secondary | ICD-10-CM | POA: Diagnosis not present

## 2019-05-11 DIAGNOSIS — Z992 Dependence on renal dialysis: Secondary | ICD-10-CM | POA: Diagnosis not present

## 2019-05-11 DIAGNOSIS — N186 End stage renal disease: Secondary | ICD-10-CM | POA: Diagnosis not present

## 2019-05-12 DIAGNOSIS — Z992 Dependence on renal dialysis: Secondary | ICD-10-CM | POA: Diagnosis not present

## 2019-05-12 DIAGNOSIS — N186 End stage renal disease: Secondary | ICD-10-CM | POA: Diagnosis not present

## 2019-05-13 DIAGNOSIS — Z992 Dependence on renal dialysis: Secondary | ICD-10-CM | POA: Diagnosis not present

## 2019-05-13 DIAGNOSIS — N186 End stage renal disease: Secondary | ICD-10-CM | POA: Diagnosis not present

## 2019-05-14 ENCOUNTER — Inpatient Hospital Stay: Payer: Medicare Other

## 2019-05-14 DIAGNOSIS — N186 End stage renal disease: Secondary | ICD-10-CM | POA: Diagnosis not present

## 2019-05-14 DIAGNOSIS — Z992 Dependence on renal dialysis: Secondary | ICD-10-CM | POA: Diagnosis not present

## 2019-05-15 DIAGNOSIS — N2581 Secondary hyperparathyroidism of renal origin: Secondary | ICD-10-CM | POA: Diagnosis not present

## 2019-05-15 DIAGNOSIS — Z298 Encounter for other specified prophylactic measures: Secondary | ICD-10-CM | POA: Diagnosis not present

## 2019-05-15 DIAGNOSIS — D509 Iron deficiency anemia, unspecified: Secondary | ICD-10-CM | POA: Diagnosis not present

## 2019-05-15 DIAGNOSIS — D631 Anemia in chronic kidney disease: Secondary | ICD-10-CM | POA: Diagnosis not present

## 2019-05-15 DIAGNOSIS — N186 End stage renal disease: Secondary | ICD-10-CM | POA: Diagnosis not present

## 2019-05-15 DIAGNOSIS — Z992 Dependence on renal dialysis: Secondary | ICD-10-CM | POA: Diagnosis not present

## 2019-05-16 DIAGNOSIS — Z298 Encounter for other specified prophylactic measures: Secondary | ICD-10-CM | POA: Diagnosis not present

## 2019-05-16 DIAGNOSIS — Z992 Dependence on renal dialysis: Secondary | ICD-10-CM | POA: Diagnosis not present

## 2019-05-16 DIAGNOSIS — N186 End stage renal disease: Secondary | ICD-10-CM | POA: Diagnosis not present

## 2019-05-16 DIAGNOSIS — D631 Anemia in chronic kidney disease: Secondary | ICD-10-CM | POA: Diagnosis not present

## 2019-05-16 DIAGNOSIS — D509 Iron deficiency anemia, unspecified: Secondary | ICD-10-CM | POA: Diagnosis not present

## 2019-05-16 DIAGNOSIS — N2581 Secondary hyperparathyroidism of renal origin: Secondary | ICD-10-CM | POA: Diagnosis not present

## 2019-05-17 DIAGNOSIS — N186 End stage renal disease: Secondary | ICD-10-CM | POA: Diagnosis not present

## 2019-05-17 DIAGNOSIS — D509 Iron deficiency anemia, unspecified: Secondary | ICD-10-CM | POA: Diagnosis not present

## 2019-05-17 DIAGNOSIS — Z298 Encounter for other specified prophylactic measures: Secondary | ICD-10-CM | POA: Diagnosis not present

## 2019-05-17 DIAGNOSIS — D631 Anemia in chronic kidney disease: Secondary | ICD-10-CM | POA: Diagnosis not present

## 2019-05-17 DIAGNOSIS — Z992 Dependence on renal dialysis: Secondary | ICD-10-CM | POA: Diagnosis not present

## 2019-05-17 DIAGNOSIS — N2581 Secondary hyperparathyroidism of renal origin: Secondary | ICD-10-CM | POA: Diagnosis not present

## 2019-05-18 DIAGNOSIS — D631 Anemia in chronic kidney disease: Secondary | ICD-10-CM | POA: Diagnosis not present

## 2019-05-18 DIAGNOSIS — N186 End stage renal disease: Secondary | ICD-10-CM | POA: Diagnosis not present

## 2019-05-18 DIAGNOSIS — N2581 Secondary hyperparathyroidism of renal origin: Secondary | ICD-10-CM | POA: Diagnosis not present

## 2019-05-18 DIAGNOSIS — Z992 Dependence on renal dialysis: Secondary | ICD-10-CM | POA: Diagnosis not present

## 2019-05-18 DIAGNOSIS — D509 Iron deficiency anemia, unspecified: Secondary | ICD-10-CM | POA: Diagnosis not present

## 2019-05-18 DIAGNOSIS — Z298 Encounter for other specified prophylactic measures: Secondary | ICD-10-CM | POA: Diagnosis not present

## 2019-05-19 DIAGNOSIS — N186 End stage renal disease: Secondary | ICD-10-CM | POA: Diagnosis not present

## 2019-05-19 DIAGNOSIS — D631 Anemia in chronic kidney disease: Secondary | ICD-10-CM | POA: Diagnosis not present

## 2019-05-19 DIAGNOSIS — Z992 Dependence on renal dialysis: Secondary | ICD-10-CM | POA: Diagnosis not present

## 2019-05-19 DIAGNOSIS — Z298 Encounter for other specified prophylactic measures: Secondary | ICD-10-CM | POA: Diagnosis not present

## 2019-05-19 DIAGNOSIS — D509 Iron deficiency anemia, unspecified: Secondary | ICD-10-CM | POA: Diagnosis not present

## 2019-05-19 DIAGNOSIS — N2581 Secondary hyperparathyroidism of renal origin: Secondary | ICD-10-CM | POA: Diagnosis not present

## 2019-05-20 DIAGNOSIS — Z298 Encounter for other specified prophylactic measures: Secondary | ICD-10-CM | POA: Diagnosis not present

## 2019-05-20 DIAGNOSIS — D631 Anemia in chronic kidney disease: Secondary | ICD-10-CM | POA: Diagnosis not present

## 2019-05-20 DIAGNOSIS — Z992 Dependence on renal dialysis: Secondary | ICD-10-CM | POA: Diagnosis not present

## 2019-05-20 DIAGNOSIS — D509 Iron deficiency anemia, unspecified: Secondary | ICD-10-CM | POA: Diagnosis not present

## 2019-05-20 DIAGNOSIS — N186 End stage renal disease: Secondary | ICD-10-CM | POA: Diagnosis not present

## 2019-05-20 DIAGNOSIS — N2581 Secondary hyperparathyroidism of renal origin: Secondary | ICD-10-CM | POA: Diagnosis not present

## 2019-05-21 DIAGNOSIS — D509 Iron deficiency anemia, unspecified: Secondary | ICD-10-CM | POA: Diagnosis not present

## 2019-05-21 DIAGNOSIS — N2581 Secondary hyperparathyroidism of renal origin: Secondary | ICD-10-CM | POA: Diagnosis not present

## 2019-05-21 DIAGNOSIS — Z992 Dependence on renal dialysis: Secondary | ICD-10-CM | POA: Diagnosis not present

## 2019-05-21 DIAGNOSIS — D631 Anemia in chronic kidney disease: Secondary | ICD-10-CM | POA: Diagnosis not present

## 2019-05-21 DIAGNOSIS — N186 End stage renal disease: Secondary | ICD-10-CM | POA: Diagnosis not present

## 2019-05-21 DIAGNOSIS — Z298 Encounter for other specified prophylactic measures: Secondary | ICD-10-CM | POA: Diagnosis not present

## 2019-05-22 DIAGNOSIS — Z992 Dependence on renal dialysis: Secondary | ICD-10-CM | POA: Diagnosis not present

## 2019-05-22 DIAGNOSIS — D509 Iron deficiency anemia, unspecified: Secondary | ICD-10-CM | POA: Diagnosis not present

## 2019-05-22 DIAGNOSIS — Z298 Encounter for other specified prophylactic measures: Secondary | ICD-10-CM | POA: Diagnosis not present

## 2019-05-22 DIAGNOSIS — N186 End stage renal disease: Secondary | ICD-10-CM | POA: Diagnosis not present

## 2019-05-22 DIAGNOSIS — N2581 Secondary hyperparathyroidism of renal origin: Secondary | ICD-10-CM | POA: Diagnosis not present

## 2019-05-22 DIAGNOSIS — D631 Anemia in chronic kidney disease: Secondary | ICD-10-CM | POA: Diagnosis not present

## 2019-05-24 DIAGNOSIS — N2581 Secondary hyperparathyroidism of renal origin: Secondary | ICD-10-CM | POA: Diagnosis not present

## 2019-05-24 DIAGNOSIS — N186 End stage renal disease: Secondary | ICD-10-CM | POA: Diagnosis not present

## 2019-05-24 DIAGNOSIS — D631 Anemia in chronic kidney disease: Secondary | ICD-10-CM | POA: Diagnosis not present

## 2019-05-24 DIAGNOSIS — Z298 Encounter for other specified prophylactic measures: Secondary | ICD-10-CM | POA: Diagnosis not present

## 2019-05-24 DIAGNOSIS — Z992 Dependence on renal dialysis: Secondary | ICD-10-CM | POA: Diagnosis not present

## 2019-05-24 DIAGNOSIS — D509 Iron deficiency anemia, unspecified: Secondary | ICD-10-CM | POA: Diagnosis not present

## 2019-05-25 DIAGNOSIS — D631 Anemia in chronic kidney disease: Secondary | ICD-10-CM | POA: Diagnosis not present

## 2019-05-25 DIAGNOSIS — Z992 Dependence on renal dialysis: Secondary | ICD-10-CM | POA: Diagnosis not present

## 2019-05-25 DIAGNOSIS — D509 Iron deficiency anemia, unspecified: Secondary | ICD-10-CM | POA: Diagnosis not present

## 2019-05-25 DIAGNOSIS — Z298 Encounter for other specified prophylactic measures: Secondary | ICD-10-CM | POA: Diagnosis not present

## 2019-05-25 DIAGNOSIS — N2581 Secondary hyperparathyroidism of renal origin: Secondary | ICD-10-CM | POA: Diagnosis not present

## 2019-05-25 DIAGNOSIS — N186 End stage renal disease: Secondary | ICD-10-CM | POA: Diagnosis not present

## 2019-05-26 DIAGNOSIS — Z992 Dependence on renal dialysis: Secondary | ICD-10-CM | POA: Diagnosis not present

## 2019-05-26 DIAGNOSIS — N2581 Secondary hyperparathyroidism of renal origin: Secondary | ICD-10-CM | POA: Diagnosis not present

## 2019-05-26 DIAGNOSIS — D631 Anemia in chronic kidney disease: Secondary | ICD-10-CM | POA: Diagnosis not present

## 2019-05-26 DIAGNOSIS — N186 End stage renal disease: Secondary | ICD-10-CM | POA: Diagnosis not present

## 2019-05-26 DIAGNOSIS — Z298 Encounter for other specified prophylactic measures: Secondary | ICD-10-CM | POA: Diagnosis not present

## 2019-05-26 DIAGNOSIS — D509 Iron deficiency anemia, unspecified: Secondary | ICD-10-CM | POA: Diagnosis not present

## 2019-05-27 DIAGNOSIS — Z992 Dependence on renal dialysis: Secondary | ICD-10-CM | POA: Diagnosis not present

## 2019-05-27 DIAGNOSIS — Z298 Encounter for other specified prophylactic measures: Secondary | ICD-10-CM | POA: Diagnosis not present

## 2019-05-27 DIAGNOSIS — N186 End stage renal disease: Secondary | ICD-10-CM | POA: Diagnosis not present

## 2019-05-27 DIAGNOSIS — D509 Iron deficiency anemia, unspecified: Secondary | ICD-10-CM | POA: Diagnosis not present

## 2019-05-27 DIAGNOSIS — D631 Anemia in chronic kidney disease: Secondary | ICD-10-CM | POA: Diagnosis not present

## 2019-05-27 DIAGNOSIS — N2581 Secondary hyperparathyroidism of renal origin: Secondary | ICD-10-CM | POA: Diagnosis not present

## 2019-05-28 DIAGNOSIS — N2581 Secondary hyperparathyroidism of renal origin: Secondary | ICD-10-CM | POA: Diagnosis not present

## 2019-05-28 DIAGNOSIS — Z298 Encounter for other specified prophylactic measures: Secondary | ICD-10-CM | POA: Diagnosis not present

## 2019-05-28 DIAGNOSIS — Z992 Dependence on renal dialysis: Secondary | ICD-10-CM | POA: Diagnosis not present

## 2019-05-28 DIAGNOSIS — D509 Iron deficiency anemia, unspecified: Secondary | ICD-10-CM | POA: Diagnosis not present

## 2019-05-28 DIAGNOSIS — N186 End stage renal disease: Secondary | ICD-10-CM | POA: Diagnosis not present

## 2019-05-28 DIAGNOSIS — D631 Anemia in chronic kidney disease: Secondary | ICD-10-CM | POA: Diagnosis not present

## 2019-05-29 DIAGNOSIS — D509 Iron deficiency anemia, unspecified: Secondary | ICD-10-CM | POA: Diagnosis not present

## 2019-05-29 DIAGNOSIS — N2581 Secondary hyperparathyroidism of renal origin: Secondary | ICD-10-CM | POA: Diagnosis not present

## 2019-05-29 DIAGNOSIS — N186 End stage renal disease: Secondary | ICD-10-CM | POA: Diagnosis not present

## 2019-05-29 DIAGNOSIS — D631 Anemia in chronic kidney disease: Secondary | ICD-10-CM | POA: Diagnosis not present

## 2019-05-29 DIAGNOSIS — Z298 Encounter for other specified prophylactic measures: Secondary | ICD-10-CM | POA: Diagnosis not present

## 2019-05-29 DIAGNOSIS — Z992 Dependence on renal dialysis: Secondary | ICD-10-CM | POA: Diagnosis not present

## 2019-05-30 DIAGNOSIS — Z298 Encounter for other specified prophylactic measures: Secondary | ICD-10-CM | POA: Diagnosis not present

## 2019-05-30 DIAGNOSIS — Z992 Dependence on renal dialysis: Secondary | ICD-10-CM | POA: Diagnosis not present

## 2019-05-30 DIAGNOSIS — D631 Anemia in chronic kidney disease: Secondary | ICD-10-CM | POA: Diagnosis not present

## 2019-05-30 DIAGNOSIS — D509 Iron deficiency anemia, unspecified: Secondary | ICD-10-CM | POA: Diagnosis not present

## 2019-05-30 DIAGNOSIS — N186 End stage renal disease: Secondary | ICD-10-CM | POA: Diagnosis not present

## 2019-05-30 DIAGNOSIS — N2581 Secondary hyperparathyroidism of renal origin: Secondary | ICD-10-CM | POA: Diagnosis not present

## 2019-05-31 DIAGNOSIS — N2581 Secondary hyperparathyroidism of renal origin: Secondary | ICD-10-CM | POA: Diagnosis not present

## 2019-05-31 DIAGNOSIS — Z992 Dependence on renal dialysis: Secondary | ICD-10-CM | POA: Diagnosis not present

## 2019-05-31 DIAGNOSIS — Z298 Encounter for other specified prophylactic measures: Secondary | ICD-10-CM | POA: Diagnosis not present

## 2019-05-31 DIAGNOSIS — D631 Anemia in chronic kidney disease: Secondary | ICD-10-CM | POA: Diagnosis not present

## 2019-05-31 DIAGNOSIS — N186 End stage renal disease: Secondary | ICD-10-CM | POA: Diagnosis not present

## 2019-05-31 DIAGNOSIS — D509 Iron deficiency anemia, unspecified: Secondary | ICD-10-CM | POA: Diagnosis not present

## 2019-06-01 DIAGNOSIS — N2581 Secondary hyperparathyroidism of renal origin: Secondary | ICD-10-CM | POA: Diagnosis not present

## 2019-06-01 DIAGNOSIS — Z298 Encounter for other specified prophylactic measures: Secondary | ICD-10-CM | POA: Diagnosis not present

## 2019-06-01 DIAGNOSIS — D631 Anemia in chronic kidney disease: Secondary | ICD-10-CM | POA: Diagnosis not present

## 2019-06-01 DIAGNOSIS — Z992 Dependence on renal dialysis: Secondary | ICD-10-CM | POA: Diagnosis not present

## 2019-06-01 DIAGNOSIS — D509 Iron deficiency anemia, unspecified: Secondary | ICD-10-CM | POA: Diagnosis not present

## 2019-06-01 DIAGNOSIS — N186 End stage renal disease: Secondary | ICD-10-CM | POA: Diagnosis not present

## 2019-06-02 DIAGNOSIS — N2581 Secondary hyperparathyroidism of renal origin: Secondary | ICD-10-CM | POA: Diagnosis not present

## 2019-06-02 DIAGNOSIS — Z992 Dependence on renal dialysis: Secondary | ICD-10-CM | POA: Diagnosis not present

## 2019-06-02 DIAGNOSIS — Z298 Encounter for other specified prophylactic measures: Secondary | ICD-10-CM | POA: Diagnosis not present

## 2019-06-02 DIAGNOSIS — D509 Iron deficiency anemia, unspecified: Secondary | ICD-10-CM | POA: Diagnosis not present

## 2019-06-02 DIAGNOSIS — D631 Anemia in chronic kidney disease: Secondary | ICD-10-CM | POA: Diagnosis not present

## 2019-06-02 DIAGNOSIS — N186 End stage renal disease: Secondary | ICD-10-CM | POA: Diagnosis not present

## 2019-06-03 ENCOUNTER — Telehealth: Payer: Self-pay | Admitting: Family Medicine

## 2019-06-03 ENCOUNTER — Inpatient Hospital Stay: Payer: Medicare Other | Attending: Oncology

## 2019-06-03 ENCOUNTER — Other Ambulatory Visit: Payer: Self-pay

## 2019-06-03 ENCOUNTER — Inpatient Hospital Stay (HOSPITAL_BASED_OUTPATIENT_CLINIC_OR_DEPARTMENT_OTHER): Payer: Medicare Other | Admitting: Oncology

## 2019-06-03 ENCOUNTER — Other Ambulatory Visit: Payer: Self-pay | Admitting: Family Medicine

## 2019-06-03 ENCOUNTER — Encounter: Payer: Self-pay | Admitting: Oncology

## 2019-06-03 VITALS — BP 140/65 | HR 66 | Temp 96.6°F | Resp 16 | Wt 130.0 lb

## 2019-06-03 DIAGNOSIS — N2581 Secondary hyperparathyroidism of renal origin: Secondary | ICD-10-CM | POA: Diagnosis not present

## 2019-06-03 DIAGNOSIS — M7989 Other specified soft tissue disorders: Secondary | ICD-10-CM | POA: Insufficient documentation

## 2019-06-03 DIAGNOSIS — Z87891 Personal history of nicotine dependence: Secondary | ICD-10-CM | POA: Diagnosis not present

## 2019-06-03 DIAGNOSIS — I120 Hypertensive chronic kidney disease with stage 5 chronic kidney disease or end stage renal disease: Secondary | ICD-10-CM | POA: Insufficient documentation

## 2019-06-03 DIAGNOSIS — N185 Chronic kidney disease, stage 5: Secondary | ICD-10-CM

## 2019-06-03 DIAGNOSIS — Z992 Dependence on renal dialysis: Secondary | ICD-10-CM | POA: Insufficient documentation

## 2019-06-03 DIAGNOSIS — D631 Anemia in chronic kidney disease: Secondary | ICD-10-CM | POA: Insufficient documentation

## 2019-06-03 DIAGNOSIS — R634 Abnormal weight loss: Secondary | ICD-10-CM | POA: Insufficient documentation

## 2019-06-03 DIAGNOSIS — I2510 Atherosclerotic heart disease of native coronary artery without angina pectoris: Secondary | ICD-10-CM

## 2019-06-03 DIAGNOSIS — D472 Monoclonal gammopathy: Secondary | ICD-10-CM | POA: Diagnosis not present

## 2019-06-03 DIAGNOSIS — Z298 Encounter for other specified prophylactic measures: Secondary | ICD-10-CM | POA: Diagnosis not present

## 2019-06-03 DIAGNOSIS — E1122 Type 2 diabetes mellitus with diabetic chronic kidney disease: Secondary | ICD-10-CM | POA: Diagnosis not present

## 2019-06-03 DIAGNOSIS — D509 Iron deficiency anemia, unspecified: Secondary | ICD-10-CM | POA: Diagnosis not present

## 2019-06-03 DIAGNOSIS — N186 End stage renal disease: Secondary | ICD-10-CM | POA: Diagnosis not present

## 2019-06-03 DIAGNOSIS — D649 Anemia, unspecified: Secondary | ICD-10-CM

## 2019-06-03 LAB — CBC WITH DIFFERENTIAL/PLATELET
Abs Immature Granulocytes: 0.02 10*3/uL (ref 0.00–0.07)
Basophils Absolute: 0.1 10*3/uL (ref 0.0–0.1)
Basophils Relative: 1 %
Eosinophils Absolute: 0.4 10*3/uL (ref 0.0–0.5)
Eosinophils Relative: 6 %
HCT: 42.2 % (ref 39.0–52.0)
Hemoglobin: 13.3 g/dL (ref 13.0–17.0)
Immature Granulocytes: 0 %
Lymphocytes Relative: 40 %
Lymphs Abs: 3.1 10*3/uL (ref 0.7–4.0)
MCH: 30.2 pg (ref 26.0–34.0)
MCHC: 31.5 g/dL (ref 30.0–36.0)
MCV: 95.7 fL (ref 80.0–100.0)
Monocytes Absolute: 0.7 10*3/uL (ref 0.1–1.0)
Monocytes Relative: 9 %
Neutro Abs: 3.5 10*3/uL (ref 1.7–7.7)
Neutrophils Relative %: 44 %
Platelets: 166 10*3/uL (ref 150–400)
RBC: 4.41 MIL/uL (ref 4.22–5.81)
RDW: 14.4 % (ref 11.5–15.5)
WBC: 7.9 10*3/uL (ref 4.0–10.5)
nRBC: 0 % (ref 0.0–0.2)

## 2019-06-03 LAB — IRON AND TIBC
Iron: 85 ug/dL (ref 45–182)
Saturation Ratios: 42 % — ABNORMAL HIGH (ref 17.9–39.5)
TIBC: 203 ug/dL — ABNORMAL LOW (ref 250–450)
UIBC: 118 ug/dL

## 2019-06-03 LAB — FERRITIN: Ferritin: 404 ng/mL — ABNORMAL HIGH (ref 24–336)

## 2019-06-03 NOTE — Telephone Encounter (Signed)
Not on his medlist. Is he still taking that?

## 2019-06-03 NOTE — Telephone Encounter (Signed)
Faxed from Pharmacy Refill request for Januvia 25 MG Tablet 90 day supply previously written 12/09/2018 by Dr. Jeananne Rama.    LOV: 04/04/2019 with Dr. Wynetta Emery NOV:07/03/2019 with Dr. Wynetta Emery

## 2019-06-03 NOTE — Progress Notes (Signed)
Pt on home dialysis and doing great, no c/o

## 2019-06-03 NOTE — Telephone Encounter (Signed)
Called and spoke to patient. He states he is not currently taking this medication.

## 2019-06-04 DIAGNOSIS — N186 End stage renal disease: Secondary | ICD-10-CM | POA: Diagnosis not present

## 2019-06-04 DIAGNOSIS — Z992 Dependence on renal dialysis: Secondary | ICD-10-CM | POA: Diagnosis not present

## 2019-06-04 DIAGNOSIS — D631 Anemia in chronic kidney disease: Secondary | ICD-10-CM | POA: Diagnosis not present

## 2019-06-04 DIAGNOSIS — D509 Iron deficiency anemia, unspecified: Secondary | ICD-10-CM | POA: Diagnosis not present

## 2019-06-04 DIAGNOSIS — Z298 Encounter for other specified prophylactic measures: Secondary | ICD-10-CM | POA: Diagnosis not present

## 2019-06-04 DIAGNOSIS — N2581 Secondary hyperparathyroidism of renal origin: Secondary | ICD-10-CM | POA: Diagnosis not present

## 2019-06-05 DIAGNOSIS — N2581 Secondary hyperparathyroidism of renal origin: Secondary | ICD-10-CM | POA: Diagnosis not present

## 2019-06-05 DIAGNOSIS — Z298 Encounter for other specified prophylactic measures: Secondary | ICD-10-CM | POA: Diagnosis not present

## 2019-06-05 DIAGNOSIS — D631 Anemia in chronic kidney disease: Secondary | ICD-10-CM | POA: Diagnosis not present

## 2019-06-05 DIAGNOSIS — Z992 Dependence on renal dialysis: Secondary | ICD-10-CM | POA: Diagnosis not present

## 2019-06-05 DIAGNOSIS — N186 End stage renal disease: Secondary | ICD-10-CM | POA: Diagnosis not present

## 2019-06-05 DIAGNOSIS — D509 Iron deficiency anemia, unspecified: Secondary | ICD-10-CM | POA: Diagnosis not present

## 2019-06-06 ENCOUNTER — Ambulatory Visit: Payer: Self-pay

## 2019-06-06 ENCOUNTER — Encounter: Payer: Self-pay | Admitting: Oncology

## 2019-06-06 DIAGNOSIS — D631 Anemia in chronic kidney disease: Secondary | ICD-10-CM | POA: Diagnosis not present

## 2019-06-06 DIAGNOSIS — Z992 Dependence on renal dialysis: Secondary | ICD-10-CM | POA: Diagnosis not present

## 2019-06-06 DIAGNOSIS — Z298 Encounter for other specified prophylactic measures: Secondary | ICD-10-CM | POA: Diagnosis not present

## 2019-06-06 DIAGNOSIS — N186 End stage renal disease: Secondary | ICD-10-CM | POA: Diagnosis not present

## 2019-06-06 DIAGNOSIS — D509 Iron deficiency anemia, unspecified: Secondary | ICD-10-CM | POA: Diagnosis not present

## 2019-06-06 DIAGNOSIS — N2581 Secondary hyperparathyroidism of renal origin: Secondary | ICD-10-CM | POA: Diagnosis not present

## 2019-06-06 NOTE — Progress Notes (Signed)
Hematology/Oncology Consult note [facility]  Telephone:([phone] [phone]  Patient Care Team: [name], MD as PCP - General (Family Medicine) [name], MD as PCP - Family Medicine (Family Medicine) Murlean Iba, MD (Internal Medicine) Dionisio David, MD as Consulting Physician (Cardiology) Sharlotte Alamo, DPM (Podiatry) Ronita Hipps, Elmer Ramp., MD (Dentistry) Lucilla Lame, MD as Consulting Physician (Gastroenterology) Vladimir Crofts, MD as Consulting Physician (Neurology) Minor, Dalbert Garnet, RN (Inactive) as [place] Management Sindy Guadeloupe, MD as Consulting Physician (Oncology)   Name of the patient: Ray Lamb  [id number]  [birth year]   Date of visit: 06/06/19  Diagnosis-anemia likely secondary to chronic kidney disease  Chief complaint/ Reason for visit-routine follow-up of anemia  Heme/Onc history: Patient is a 86 year old male with a past medical history significant for hypertension hyperlipidemia and diabetes. He also has chronic kidney disease for which he sees Dr. Candiss Norse. His renal functions have been getting worse and his baseline creatinine up until May to June 2020 was about 2.4-2.9. On 01/22/2019 his creatinine had gone up to 4.5. Around the same time his hemoglobin also decreased from his baseline between 9 to 10-6.7 on 01/22/2019.  During his visit in December 2020 there were concerns of early uremia and he was seen by nephrology urgently.  He is getting vascular access done and is likely to start hemodialysis in the next 3 to 4 weeks  Patient had extensive anemia work-up done on 01/24/2019 which showed WBC of 8.6, H&H of 6.2/19.7 and a platelet count of 249.  Creatinine was elevated at 5.04.  B12 and folate were unremarkable.  Ferritin was 80 and iron studies showed a low iron saturation of 14%.  Reticulocyte count was inappropriately low at 4.2% for the degree of anemia.  Multiple myeloma panel showed M  protein of 0.5 g IgM lambda.  TSH was normal in both kappa and lambda free light chains were elevated with a normal kappa lambda light chain ratio of 1.4.  Interval history-patient states he is doing much better after he started dialysis.  His appetite has improved although he has lost 26 pounds over the last 4 months since starting dialysis.  He still has some bilateral leg swelling which is overall improved.  Nausea has resolved  ECOG PS- 2 Pain scale- 0   Review of systems- Review of Systems  Constitutional: Positive for weight loss. Negative for chills, fever and malaise/fatigue.  HENT: Negative for congestion, ear discharge and nosebleeds.   Eyes: Negative for blurred vision.  Respiratory: Negative for cough, hemoptysis, sputum production, shortness of breath and wheezing.   Cardiovascular: Positive for leg swelling. Negative for chest pain, palpitations, orthopnea and claudication.  Gastrointestinal: Negative for abdominal pain, blood in stool, constipation, diarrhea, heartburn, melena, nausea and vomiting.  Genitourinary: Negative for dysuria, flank pain, frequency, hematuria and urgency.  Musculoskeletal: Negative for back pain, joint pain and myalgias.  Skin: Negative for rash.  Neurological: Negative for dizziness, tingling, focal weakness, seizures, weakness and headaches.  Endo/Heme/Allergies: Does not bruise/bleed easily.  Psychiatric/Behavioral: Negative for depression and suicidal ideas. The patient does not have insomnia.       Allergies  Allergen Reactions  . Bee Venom Hives  . Iodinated Diagnostic Agents Rash  . Metrizamide Rash  . Other Rash and Other (See Comments)    Dye     Past Medical History:  Diagnosis Date  . Anemia   . Chronic kidney disease    STAGE  4  . Coronary atherosclerosis    CABG X 4  . Diabetes mellitus without complication ([place])   . Edema    ankle  . GERD (gastroesophageal reflux disease)   . Glaucoma   . Hearing loss   . Heart  murmur   . History of hiatal hernia   . Hyperlipidemia   . Hypertension   . Mini stroke ([place])   . Mitral insufficiency   . Myocardial infarction ([place])    mild, heart cath done no stents needed  . Stroke [facility]) 2011   TIA     Past Surgical History:  Procedure Laterality Date  . CAPD INSERTION N/A 03/12/2019   Procedure: CONTINUOUS AMBULATORY PERITONEAL DIALYSIS (CAPD) CATHETER;  Surgeon: Algernon Huxley, MD;  Location: [facility] ORS;  Service: Vascular;  Laterality: N/A;  . CATARACT EXTRACTION W/PHACO Left 09/05/2016   Procedure: CATARACT EXTRACTION PHACO AND INTRAOCULAR LENS PLACEMENT ([place]);  Surgeon: Birder Robson, MD;  Location: [facility] ORS;  Service: Ophthalmology;  Laterality: Left;  [place] 00:43.1 AP% 22.6 CDE 9.77 Fluid Pack lot #[id number]  . CATARACT EXTRACTION W/PHACO Right 09/26/2016   Procedure: CATARACT EXTRACTION PHACO AND INTRAOCULAR LENS PLACEMENT ([place]);  Surgeon: Birder Robson, MD;  Location: [facility] ORS;  Service: Ophthalmology;  Laterality: Right;  [place] 01:00 AP% 16.0 CDE 9.71 Fluid pack lot # [id number]  . CORONARY ARTERY BYPASS GRAFT  2003  . DIALYSIS/PERMA CATHETER INSERTION N/A 10/04/2017   Procedure: DIALYSIS/PERMA CATHETER INSERTION;  Surgeon: Algernon Huxley, MD;  Location: [place] [facility] LAB;  Service: Cardiovascular;  Laterality: N/A;  . DIALYSIS/PERMA CATHETER REMOVAL N/A 11/01/2017   Procedure: DIALYSIS/PERMA CATHETER REMOVAL;  Surgeon: Algernon Huxley, MD;  Location: [place] [facility];  Service: Cardiovascular;  Laterality: N/A;  . ESOPHAGOGASTRODUODENOSCOPY (EGD) WITH PROPOFOL N/A 04/25/2016   Procedure: ESOPHAGOGASTRODUODENOSCOPY (EGD) WITH PROPOFOL;  Surgeon: Lucilla Lame, MD;  Location: [facility] ENDOSCOPY;  Service: Endoscopy;  Laterality: N/A;  . JOINT REPLACEMENT Right 2010   knee  . LEFT HEART CATH AND CORONARY ANGIOGRAPHY N/A 10/04/2017   Procedure: LEFT HEART CATH AND CORONARY ANGIOGRAPHY;  Surgeon: Isaias Cowman, MD;  Location: [place] [facility];  Service:  Cardiovascular;  Laterality: N/A;  . LEFT HEART CATH AND CORONARY ANGIOGRAPHY Left 07/01/2018   Procedure: LEFT HEART CATH AND CORONARY ANGIOGRAPHY;  Surgeon: Dionisio David, MD;  Location: [place] [facility];  Service: Cardiovascular;  Laterality: Left;  . SKIN CANCER EXCISION      Social History   Socioeconomic History  . Marital status: Married    Spouse name: Not on file  . Number of children: Not on file  . Years of education: Not on file  . Highest education level: Not on file  Occupational History  . Not on file  Tobacco Use  . Smoking status: Former Smoker    Quit date: 09/02/1964    Years since quitting: 54.7  . Smokeless tobacco: Never Used  Substance and Sexual Activity  . Alcohol use: No    Alcohol/week: 0.0 standard drinks  . Drug use: No  . Sexual activity: Not Currently  Other Topics Concern  . Not on file  Social History Narrative  . Not on file   Social Determinants of Health   Financial Resource Strain:   . Difficulty of Paying Living Expenses:   Food Insecurity:   . Worried About Charity fundraiser in the Last Year:   . Arboriculturist in the Last Year:   Transportation Needs:   .  Lack of Transportation (Medical):   Marland Kitchen Lack of Transportation (Non-Medical):   Physical Activity:   . Days of Exercise per Week:   . Minutes of Exercise per Session:   Stress:   . Feeling of Stress :   Social Connections:   . Frequency of Communication with Friends and Family:   . Frequency of Social Gatherings with Friends and Family:   . Attends Religious Services:   . Active Member of Clubs or Organizations:   . Attends Archivist Meetings:   Marland Kitchen Marital Status:   Intimate Partner Violence:   . Fear of Current or Ex-Partner:   . Emotionally Abused:   Marland Kitchen Physically Abused:   . Sexually Abused:     Family History  Problem Relation Age of Onset  . Stroke Mother   . Diabetes Mother   . Diabetes Sister   . Diabetes Maternal Grandmother       Current Outpatient Medications:  .  ASPIRIN 81 PO, Take by mouth daily., Disp: , Rfl:  .  atorvastatin (LIPITOR) 80 MG tablet, Take 1 tablet (80 mg total) by mouth at bedtime., Disp: 90 tablet, Rfl: 4 .  carvedilol (COREG) 12.5 MG tablet, Take by mouth 2 (two) times daily. , Disp: , Rfl:  .  clopidogrel (PLAVIX) 75 MG tablet, TAKE 1 TABLET DAILY (Patient taking differently: 75 mg daily. ), Disp: 90 tablet, Rfl: 3 .  ezetimibe (ZETIA) 10 MG tablet, Take 1 tablet (10 mg total) by mouth daily., Disp: 90 tablet, Rfl: 1 .  furosemide (LASIX) 40 MG tablet, Take 40 mg by mouth daily as needed for fluid. , Disp: , Rfl:  .  isosorbide mononitrate (IMDUR) 30 MG 24 hr tablet, Take 1.5 tablets (45 mg total) by mouth daily., Disp: 135 tablet, Rfl: 1 .  Loratadine (CLARITIN PO), Take 1 tablet by mouth daily as needed. , Disp: , Rfl:  .  memantine (NAMENDA) 5 MG tablet, Take 1 tablet by mouth 2 (two) times daily., Disp: , Rfl:  .  Multiple Vitamins-Minerals (CENTRUM SILVER ULTRA MENS PO), Take by mouth daily., Disp: , Rfl:  .  ondansetron (ZOFRAN) 4 MG tablet, Take 4 mg by mouth every 8 (eight) hours as needed., Disp: , Rfl:  .  timolol (TIMOPTIC) 0.5 % ophthalmic solution, Place 1 drop into both eyes daily. , Disp: , Rfl:  .  TURMERIC PO, Take 1,300 mg by mouth daily. , Disp: , Rfl:  .  mirtazapine (REMERON) 7.5 MG tablet, Take 7.5 mg by mouth as needed. , Disp: , Rfl:  .  traMADol (ULTRAM) 50 MG tablet, Take 1 tablet (50 mg total) by mouth every 6 (six) hours as needed. (Patient not taking: Reported on 04/04/2019), Disp: 20 tablet, Rfl: 0 No current facility-administered medications for this visit.  Facility-Administered Medications Ordered in Other Visits:  .  sodium chloride flush (NS) 0.9 % injection 3 mL, 3 mL, Intravenous, Q12H, Jake Bathe, FNP  Physical exam:  Vitals:   06/03/19 1129  BP: 140/65  Pulse: 66  Resp: 16  Temp: (!) 96.6 F (35.9 C)  TempSrc: Tympanic  Weight: 130 lb  (59 kg)   Physical Exam Constitutional:      Comments: Thin elderly gentleman who ambulates with a cane.  Appears in no acute distress  HENT:     Head: Normocephalic and atraumatic.  Eyes:     Pupils: Pupils are equal, round, and reactive to light.  Cardiovascular:     Rate and Rhythm: Normal rate  and regular rhythm.     Heart sounds: Normal heart sounds.  Pulmonary:     Effort: Pulmonary effort is normal.     Breath sounds: Normal breath sounds.  Abdominal:     General: Bowel sounds are normal.     Palpations: Abdomen is soft.  Musculoskeletal:     Cervical back: Normal range of motion.     Comments: Bilateral +1 edema  Skin:    General: Skin is warm and dry.  Neurological:     Mental Status: He is alert and oriented to person, place, and time.      CMP Latest Ref Rng & Units 03/12/2019  Glucose 70 - 99 mg/dL 140(H)  BUN 8 - 23 mg/dL 109(H)  Creatinine 0.61 - 1.24 mg/dL 7.60(H)  Sodium 135 - 145 mmol/L 132(L)  Potassium 3.5 - 5.1 mmol/L 4.2  Chloride 98 - 111 mmol/L 95(L)  CO2 22 - 32 mmol/L -  Calcium 8.9 - 10.3 mg/dL -  Total Protein 6.5 - 8.1 g/dL -  Total Bilirubin 0.3 - 1.2 mg/dL -  Alkaline Phos 38 - 126 U/L -  AST 15 - 41 U/L -  ALT 0 - 44 U/L -   CBC Latest Ref Rng & Units 06/03/2019  WBC 4.0 - 10.5 K/uL 7.9  Hemoglobin 13.0 - 17.0 g/dL 13.3  Hematocrit 39.0 - 52.0 % 42.2  Platelets 150 - 400 K/uL 166   Assessment and plan- Patient is a 86 y.o. male with anemia of chronic kidney disease here for routine follow-up  Since starting dialysis patient feels symptomatically better.  He has been receiving EPO and IV iron through dialysis.  Today his H&H is 13.3 and 42.2.  It is unclear if it is approved by you since patient has been significantly anemic in the past with a hemoglobin that was close to 7-8 in January 2021.  Patient will be getting a repeat CBC with nephrology office tomorrow and we have asked them to fax the report to [place] to confirm that today's blood  work was indeed accurate.  Ferritin levels are 404 and iron saturation is 42% which is adequate and he can continue to receive iron and EPO through dialysis.  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 Anemia of chronic kidney failure, stage 5 (HCC)      Dr. Randa Evens, MD, MPH [facility] [phone] 06/06/2019 8:13 AM

## 2019-06-06 NOTE — Chronic Care Management (AMB) (Signed)
  Care Management   Follow Up Note   06/06/2019 Name: Ray Lamb MRN: [id number] DOB: [birth year]  Referred by: [name], MD Reason for referral : [name] is a 86 y.o. year old male who is a primary care patient of Crissman, Jeannette How, MD. The care management team was consulted for assistance with care management and care coordination needs.    Review of patient status, including review of consultants reports, relevant laboratory and other test results, and collaboration with appropriate care team members and the patient's provider was performed as part of comprehensive patient evaluation and provision of chronic care management services.    LCSW completed CCM outreach attempt today but was unable to reach patient successfully. A HIPPA compliant voice message was left encouraging patient to return call once available. LCSW rescheduled CCM SW appointment as well.   A HIPPA compliant phone message was left for the patient providing contact information and requesting a return call.   Eula Fried, BSW, MSW, [facility] Care [facility] [email or web address] Phone: [phone]

## 2019-06-07 DIAGNOSIS — D631 Anemia in chronic kidney disease: Secondary | ICD-10-CM | POA: Diagnosis not present

## 2019-06-07 DIAGNOSIS — D509 Iron deficiency anemia, unspecified: Secondary | ICD-10-CM | POA: Diagnosis not present

## 2019-06-07 DIAGNOSIS — Z298 Encounter for other specified prophylactic measures: Secondary | ICD-10-CM | POA: Diagnosis not present

## 2019-06-07 DIAGNOSIS — N2581 Secondary hyperparathyroidism of renal origin: Secondary | ICD-10-CM | POA: Diagnosis not present

## 2019-06-07 DIAGNOSIS — N186 End stage renal disease: Secondary | ICD-10-CM | POA: Diagnosis not present

## 2019-06-07 DIAGNOSIS — Z992 Dependence on renal dialysis: Secondary | ICD-10-CM | POA: Diagnosis not present

## 2019-06-08 DIAGNOSIS — N186 End stage renal disease: Secondary | ICD-10-CM | POA: Diagnosis not present

## 2019-06-08 DIAGNOSIS — D509 Iron deficiency anemia, unspecified: Secondary | ICD-10-CM | POA: Diagnosis not present

## 2019-06-08 DIAGNOSIS — N2581 Secondary hyperparathyroidism of renal origin: Secondary | ICD-10-CM | POA: Diagnosis not present

## 2019-06-08 DIAGNOSIS — Z992 Dependence on renal dialysis: Secondary | ICD-10-CM | POA: Diagnosis not present

## 2019-06-08 DIAGNOSIS — D631 Anemia in chronic kidney disease: Secondary | ICD-10-CM | POA: Diagnosis not present

## 2019-06-08 DIAGNOSIS — Z298 Encounter for other specified prophylactic measures: Secondary | ICD-10-CM | POA: Diagnosis not present

## 2019-06-09 ENCOUNTER — Other Ambulatory Visit: Payer: Medicare Other

## 2019-06-09 ENCOUNTER — Ambulatory Visit: Payer: Medicare Other | Admitting: Oncology

## 2019-06-09 DIAGNOSIS — N2581 Secondary hyperparathyroidism of renal origin: Secondary | ICD-10-CM | POA: Diagnosis not present

## 2019-06-09 DIAGNOSIS — Z992 Dependence on renal dialysis: Secondary | ICD-10-CM | POA: Diagnosis not present

## 2019-06-09 DIAGNOSIS — Z298 Encounter for other specified prophylactic measures: Secondary | ICD-10-CM | POA: Diagnosis not present

## 2019-06-09 DIAGNOSIS — N186 End stage renal disease: Secondary | ICD-10-CM | POA: Diagnosis not present

## 2019-06-09 DIAGNOSIS — D631 Anemia in chronic kidney disease: Secondary | ICD-10-CM | POA: Diagnosis not present

## 2019-06-09 DIAGNOSIS — D509 Iron deficiency anemia, unspecified: Secondary | ICD-10-CM | POA: Diagnosis not present

## 2019-06-10 DIAGNOSIS — Z992 Dependence on renal dialysis: Secondary | ICD-10-CM | POA: Diagnosis not present

## 2019-06-10 DIAGNOSIS — N2581 Secondary hyperparathyroidism of renal origin: Secondary | ICD-10-CM | POA: Diagnosis not present

## 2019-06-10 DIAGNOSIS — Z298 Encounter for other specified prophylactic measures: Secondary | ICD-10-CM | POA: Diagnosis not present

## 2019-06-10 DIAGNOSIS — D631 Anemia in chronic kidney disease: Secondary | ICD-10-CM | POA: Diagnosis not present

## 2019-06-10 DIAGNOSIS — N186 End stage renal disease: Secondary | ICD-10-CM | POA: Diagnosis not present

## 2019-06-10 DIAGNOSIS — D509 Iron deficiency anemia, unspecified: Secondary | ICD-10-CM | POA: Diagnosis not present

## 2019-06-11 DIAGNOSIS — Z298 Encounter for other specified prophylactic measures: Secondary | ICD-10-CM | POA: Diagnosis not present

## 2019-06-11 DIAGNOSIS — Z992 Dependence on renal dialysis: Secondary | ICD-10-CM | POA: Diagnosis not present

## 2019-06-11 DIAGNOSIS — D631 Anemia in chronic kidney disease: Secondary | ICD-10-CM | POA: Diagnosis not present

## 2019-06-11 DIAGNOSIS — D509 Iron deficiency anemia, unspecified: Secondary | ICD-10-CM | POA: Diagnosis not present

## 2019-06-11 DIAGNOSIS — N186 End stage renal disease: Secondary | ICD-10-CM | POA: Diagnosis not present

## 2019-06-11 DIAGNOSIS — N2581 Secondary hyperparathyroidism of renal origin: Secondary | ICD-10-CM | POA: Diagnosis not present

## 2019-06-12 DIAGNOSIS — N186 End stage renal disease: Secondary | ICD-10-CM | POA: Diagnosis not present

## 2019-06-12 DIAGNOSIS — D509 Iron deficiency anemia, unspecified: Secondary | ICD-10-CM | POA: Diagnosis not present

## 2019-06-12 DIAGNOSIS — N2581 Secondary hyperparathyroidism of renal origin: Secondary | ICD-10-CM | POA: Diagnosis not present

## 2019-06-12 DIAGNOSIS — Z298 Encounter for other specified prophylactic measures: Secondary | ICD-10-CM | POA: Diagnosis not present

## 2019-06-12 DIAGNOSIS — Z992 Dependence on renal dialysis: Secondary | ICD-10-CM | POA: Diagnosis not present

## 2019-06-12 DIAGNOSIS — D631 Anemia in chronic kidney disease: Secondary | ICD-10-CM | POA: Diagnosis not present

## 2019-06-13 DIAGNOSIS — D631 Anemia in chronic kidney disease: Secondary | ICD-10-CM | POA: Diagnosis not present

## 2019-06-13 DIAGNOSIS — Z992 Dependence on renal dialysis: Secondary | ICD-10-CM | POA: Diagnosis not present

## 2019-06-13 DIAGNOSIS — Z298 Encounter for other specified prophylactic measures: Secondary | ICD-10-CM | POA: Diagnosis not present

## 2019-06-13 DIAGNOSIS — N2581 Secondary hyperparathyroidism of renal origin: Secondary | ICD-10-CM | POA: Diagnosis not present

## 2019-06-13 DIAGNOSIS — D509 Iron deficiency anemia, unspecified: Secondary | ICD-10-CM | POA: Diagnosis not present

## 2019-06-13 DIAGNOSIS — N186 End stage renal disease: Secondary | ICD-10-CM | POA: Diagnosis not present

## 2019-06-14 DIAGNOSIS — Z992 Dependence on renal dialysis: Secondary | ICD-10-CM | POA: Diagnosis not present

## 2019-06-14 DIAGNOSIS — D631 Anemia in chronic kidney disease: Secondary | ICD-10-CM | POA: Diagnosis not present

## 2019-06-14 DIAGNOSIS — N186 End stage renal disease: Secondary | ICD-10-CM | POA: Diagnosis not present

## 2019-06-14 DIAGNOSIS — D509 Iron deficiency anemia, unspecified: Secondary | ICD-10-CM | POA: Diagnosis not present

## 2019-06-15 DIAGNOSIS — Z992 Dependence on renal dialysis: Secondary | ICD-10-CM | POA: Diagnosis not present

## 2019-06-15 DIAGNOSIS — D631 Anemia in chronic kidney disease: Secondary | ICD-10-CM | POA: Diagnosis not present

## 2019-06-15 DIAGNOSIS — N186 End stage renal disease: Secondary | ICD-10-CM | POA: Diagnosis not present

## 2019-06-15 DIAGNOSIS — D509 Iron deficiency anemia, unspecified: Secondary | ICD-10-CM | POA: Diagnosis not present

## 2019-06-16 DIAGNOSIS — Z992 Dependence on renal dialysis: Secondary | ICD-10-CM | POA: Diagnosis not present

## 2019-06-16 DIAGNOSIS — N186 End stage renal disease: Secondary | ICD-10-CM | POA: Diagnosis not present

## 2019-06-16 DIAGNOSIS — D509 Iron deficiency anemia, unspecified: Secondary | ICD-10-CM | POA: Diagnosis not present

## 2019-06-16 DIAGNOSIS — D631 Anemia in chronic kidney disease: Secondary | ICD-10-CM | POA: Diagnosis not present

## 2019-06-16 DIAGNOSIS — B351 Tinea unguium: Secondary | ICD-10-CM | POA: Diagnosis not present

## 2019-06-16 DIAGNOSIS — L03032 Cellulitis of left toe: Secondary | ICD-10-CM | POA: Diagnosis not present

## 2019-06-16 DIAGNOSIS — E119 Type 2 diabetes mellitus without complications: Secondary | ICD-10-CM | POA: Diagnosis not present

## 2019-06-16 DIAGNOSIS — L600 Ingrowing nail: Secondary | ICD-10-CM | POA: Diagnosis not present

## 2019-06-17 DIAGNOSIS — Z992 Dependence on renal dialysis: Secondary | ICD-10-CM | POA: Diagnosis not present

## 2019-06-17 DIAGNOSIS — N186 End stage renal disease: Secondary | ICD-10-CM | POA: Diagnosis not present

## 2019-06-17 DIAGNOSIS — D631 Anemia in chronic kidney disease: Secondary | ICD-10-CM | POA: Diagnosis not present

## 2019-06-17 DIAGNOSIS — D509 Iron deficiency anemia, unspecified: Secondary | ICD-10-CM | POA: Diagnosis not present

## 2019-06-18 DIAGNOSIS — N186 End stage renal disease: Secondary | ICD-10-CM | POA: Diagnosis not present

## 2019-06-18 DIAGNOSIS — D631 Anemia in chronic kidney disease: Secondary | ICD-10-CM | POA: Diagnosis not present

## 2019-06-18 DIAGNOSIS — Z992 Dependence on renal dialysis: Secondary | ICD-10-CM | POA: Diagnosis not present

## 2019-06-18 DIAGNOSIS — D509 Iron deficiency anemia, unspecified: Secondary | ICD-10-CM | POA: Diagnosis not present

## 2019-06-19 DIAGNOSIS — D631 Anemia in chronic kidney disease: Secondary | ICD-10-CM | POA: Diagnosis not present

## 2019-06-19 DIAGNOSIS — N186 End stage renal disease: Secondary | ICD-10-CM | POA: Diagnosis not present

## 2019-06-19 DIAGNOSIS — R2681 Unsteadiness on feet: Secondary | ICD-10-CM | POA: Diagnosis not present

## 2019-06-19 DIAGNOSIS — M6281 Muscle weakness (generalized): Secondary | ICD-10-CM | POA: Diagnosis not present

## 2019-06-19 DIAGNOSIS — Z992 Dependence on renal dialysis: Secondary | ICD-10-CM | POA: Diagnosis not present

## 2019-06-19 DIAGNOSIS — D509 Iron deficiency anemia, unspecified: Secondary | ICD-10-CM | POA: Diagnosis not present

## 2019-06-20 DIAGNOSIS — D631 Anemia in chronic kidney disease: Secondary | ICD-10-CM | POA: Diagnosis not present

## 2019-06-20 DIAGNOSIS — D509 Iron deficiency anemia, unspecified: Secondary | ICD-10-CM | POA: Diagnosis not present

## 2019-06-20 DIAGNOSIS — N186 End stage renal disease: Secondary | ICD-10-CM | POA: Diagnosis not present

## 2019-06-20 DIAGNOSIS — Z992 Dependence on renal dialysis: Secondary | ICD-10-CM | POA: Diagnosis not present

## 2019-06-21 DIAGNOSIS — Z992 Dependence on renal dialysis: Secondary | ICD-10-CM | POA: Diagnosis not present

## 2019-06-21 DIAGNOSIS — D509 Iron deficiency anemia, unspecified: Secondary | ICD-10-CM | POA: Diagnosis not present

## 2019-06-21 DIAGNOSIS — N186 End stage renal disease: Secondary | ICD-10-CM | POA: Diagnosis not present

## 2019-06-21 DIAGNOSIS — D631 Anemia in chronic kidney disease: Secondary | ICD-10-CM | POA: Diagnosis not present

## 2019-06-22 DIAGNOSIS — N186 End stage renal disease: Secondary | ICD-10-CM | POA: Diagnosis not present

## 2019-06-22 DIAGNOSIS — Z992 Dependence on renal dialysis: Secondary | ICD-10-CM | POA: Diagnosis not present

## 2019-06-22 DIAGNOSIS — D509 Iron deficiency anemia, unspecified: Secondary | ICD-10-CM | POA: Diagnosis not present

## 2019-06-22 DIAGNOSIS — D631 Anemia in chronic kidney disease: Secondary | ICD-10-CM | POA: Diagnosis not present

## 2019-06-23 DIAGNOSIS — N186 End stage renal disease: Secondary | ICD-10-CM | POA: Diagnosis not present

## 2019-06-23 DIAGNOSIS — Z992 Dependence on renal dialysis: Secondary | ICD-10-CM | POA: Diagnosis not present

## 2019-06-23 DIAGNOSIS — M6281 Muscle weakness (generalized): Secondary | ICD-10-CM | POA: Diagnosis not present

## 2019-06-23 DIAGNOSIS — R2681 Unsteadiness on feet: Secondary | ICD-10-CM | POA: Diagnosis not present

## 2019-06-23 DIAGNOSIS — D631 Anemia in chronic kidney disease: Secondary | ICD-10-CM | POA: Diagnosis not present

## 2019-06-23 DIAGNOSIS — D509 Iron deficiency anemia, unspecified: Secondary | ICD-10-CM | POA: Diagnosis not present

## 2019-06-24 DIAGNOSIS — Z992 Dependence on renal dialysis: Secondary | ICD-10-CM | POA: Diagnosis not present

## 2019-06-24 DIAGNOSIS — D509 Iron deficiency anemia, unspecified: Secondary | ICD-10-CM | POA: Diagnosis not present

## 2019-06-24 DIAGNOSIS — N186 End stage renal disease: Secondary | ICD-10-CM | POA: Diagnosis not present

## 2019-06-24 DIAGNOSIS — D631 Anemia in chronic kidney disease: Secondary | ICD-10-CM | POA: Diagnosis not present

## 2019-06-25 DIAGNOSIS — D631 Anemia in chronic kidney disease: Secondary | ICD-10-CM | POA: Diagnosis not present

## 2019-06-25 DIAGNOSIS — Z992 Dependence on renal dialysis: Secondary | ICD-10-CM | POA: Diagnosis not present

## 2019-06-25 DIAGNOSIS — M6281 Muscle weakness (generalized): Secondary | ICD-10-CM | POA: Diagnosis not present

## 2019-06-25 DIAGNOSIS — N186 End stage renal disease: Secondary | ICD-10-CM | POA: Diagnosis not present

## 2019-06-25 DIAGNOSIS — D509 Iron deficiency anemia, unspecified: Secondary | ICD-10-CM | POA: Diagnosis not present

## 2019-06-25 DIAGNOSIS — R2681 Unsteadiness on feet: Secondary | ICD-10-CM | POA: Diagnosis not present

## 2019-06-26 DIAGNOSIS — D631 Anemia in chronic kidney disease: Secondary | ICD-10-CM | POA: Diagnosis not present

## 2019-06-26 DIAGNOSIS — N186 End stage renal disease: Secondary | ICD-10-CM | POA: Diagnosis not present

## 2019-06-26 DIAGNOSIS — Z992 Dependence on renal dialysis: Secondary | ICD-10-CM | POA: Diagnosis not present

## 2019-06-26 DIAGNOSIS — D509 Iron deficiency anemia, unspecified: Secondary | ICD-10-CM | POA: Diagnosis not present

## 2019-06-27 DIAGNOSIS — D631 Anemia in chronic kidney disease: Secondary | ICD-10-CM | POA: Diagnosis not present

## 2019-06-27 DIAGNOSIS — D509 Iron deficiency anemia, unspecified: Secondary | ICD-10-CM | POA: Diagnosis not present

## 2019-06-27 DIAGNOSIS — N186 End stage renal disease: Secondary | ICD-10-CM | POA: Diagnosis not present

## 2019-06-27 DIAGNOSIS — Z992 Dependence on renal dialysis: Secondary | ICD-10-CM | POA: Diagnosis not present

## 2019-06-28 DIAGNOSIS — D631 Anemia in chronic kidney disease: Secondary | ICD-10-CM | POA: Diagnosis not present

## 2019-06-28 DIAGNOSIS — N186 End stage renal disease: Secondary | ICD-10-CM | POA: Diagnosis not present

## 2019-06-28 DIAGNOSIS — D509 Iron deficiency anemia, unspecified: Secondary | ICD-10-CM | POA: Diagnosis not present

## 2019-06-28 DIAGNOSIS — Z992 Dependence on renal dialysis: Secondary | ICD-10-CM | POA: Diagnosis not present

## 2019-06-29 DIAGNOSIS — D631 Anemia in chronic kidney disease: Secondary | ICD-10-CM | POA: Diagnosis not present

## 2019-06-29 DIAGNOSIS — Z992 Dependence on renal dialysis: Secondary | ICD-10-CM | POA: Diagnosis not present

## 2019-06-29 DIAGNOSIS — N186 End stage renal disease: Secondary | ICD-10-CM | POA: Diagnosis not present

## 2019-06-29 DIAGNOSIS — D509 Iron deficiency anemia, unspecified: Secondary | ICD-10-CM | POA: Diagnosis not present

## 2019-06-30 DIAGNOSIS — R2681 Unsteadiness on feet: Secondary | ICD-10-CM | POA: Diagnosis not present

## 2019-06-30 DIAGNOSIS — N186 End stage renal disease: Secondary | ICD-10-CM | POA: Diagnosis not present

## 2019-06-30 DIAGNOSIS — M6281 Muscle weakness (generalized): Secondary | ICD-10-CM | POA: Diagnosis not present

## 2019-06-30 DIAGNOSIS — D631 Anemia in chronic kidney disease: Secondary | ICD-10-CM | POA: Diagnosis not present

## 2019-06-30 DIAGNOSIS — Z992 Dependence on renal dialysis: Secondary | ICD-10-CM | POA: Diagnosis not present

## 2019-06-30 DIAGNOSIS — D509 Iron deficiency anemia, unspecified: Secondary | ICD-10-CM | POA: Diagnosis not present

## 2019-07-01 DIAGNOSIS — D509 Iron deficiency anemia, unspecified: Secondary | ICD-10-CM | POA: Diagnosis not present

## 2019-07-01 DIAGNOSIS — Z992 Dependence on renal dialysis: Secondary | ICD-10-CM | POA: Diagnosis not present

## 2019-07-01 DIAGNOSIS — D631 Anemia in chronic kidney disease: Secondary | ICD-10-CM | POA: Diagnosis not present

## 2019-07-01 DIAGNOSIS — N186 End stage renal disease: Secondary | ICD-10-CM | POA: Diagnosis not present

## 2019-07-02 DIAGNOSIS — D631 Anemia in chronic kidney disease: Secondary | ICD-10-CM | POA: Diagnosis not present

## 2019-07-02 DIAGNOSIS — Z992 Dependence on renal dialysis: Secondary | ICD-10-CM | POA: Diagnosis not present

## 2019-07-02 DIAGNOSIS — N186 End stage renal disease: Secondary | ICD-10-CM | POA: Diagnosis not present

## 2019-07-02 DIAGNOSIS — D509 Iron deficiency anemia, unspecified: Secondary | ICD-10-CM | POA: Diagnosis not present

## 2019-07-02 DIAGNOSIS — R2681 Unsteadiness on feet: Secondary | ICD-10-CM | POA: Diagnosis not present

## 2019-07-02 DIAGNOSIS — M6281 Muscle weakness (generalized): Secondary | ICD-10-CM | POA: Diagnosis not present

## 2019-07-03 ENCOUNTER — Ambulatory Visit (INDEPENDENT_AMBULATORY_CARE_PROVIDER_SITE_OTHER): Payer: Medicare Other | Admitting: Family Medicine

## 2019-07-03 ENCOUNTER — Encounter: Payer: Self-pay | Admitting: Family Medicine

## 2019-07-03 ENCOUNTER — Other Ambulatory Visit: Payer: Self-pay

## 2019-07-03 VITALS — BP 151/69 | HR 73 | Temp 97.8°F | Ht 64.17 in | Wt 137.2 lb

## 2019-07-03 DIAGNOSIS — I25709 Atherosclerosis of coronary artery bypass graft(s), unspecified, with unspecified angina pectoris: Secondary | ICD-10-CM

## 2019-07-03 DIAGNOSIS — N185 Chronic kidney disease, stage 5: Secondary | ICD-10-CM

## 2019-07-03 DIAGNOSIS — E782 Mixed hyperlipidemia: Secondary | ICD-10-CM

## 2019-07-03 DIAGNOSIS — N184 Chronic kidney disease, stage 4 (severe): Secondary | ICD-10-CM | POA: Diagnosis not present

## 2019-07-03 DIAGNOSIS — I129 Hypertensive chronic kidney disease with stage 1 through stage 4 chronic kidney disease, or unspecified chronic kidney disease: Secondary | ICD-10-CM | POA: Diagnosis not present

## 2019-07-03 DIAGNOSIS — D692 Other nonthrombocytopenic purpura: Secondary | ICD-10-CM | POA: Diagnosis not present

## 2019-07-03 DIAGNOSIS — E1122 Type 2 diabetes mellitus with diabetic chronic kidney disease: Secondary | ICD-10-CM

## 2019-07-03 DIAGNOSIS — B372 Candidiasis of skin and nail: Secondary | ICD-10-CM | POA: Diagnosis not present

## 2019-07-03 DIAGNOSIS — I5022 Chronic systolic (congestive) heart failure: Secondary | ICD-10-CM | POA: Diagnosis not present

## 2019-07-03 DIAGNOSIS — N186 End stage renal disease: Secondary | ICD-10-CM

## 2019-07-03 DIAGNOSIS — Z992 Dependence on renal dialysis: Secondary | ICD-10-CM | POA: Diagnosis not present

## 2019-07-03 DIAGNOSIS — D509 Iron deficiency anemia, unspecified: Secondary | ICD-10-CM | POA: Diagnosis not present

## 2019-07-03 DIAGNOSIS — D631 Anemia in chronic kidney disease: Secondary | ICD-10-CM | POA: Diagnosis not present

## 2019-07-03 LAB — MICROALBUMIN, URINE WAIVED
Creatinine, Urine Waived: 50 mg/dL (ref 10–300)
Microalb, Ur Waived: 150 mg/L — ABNORMAL HIGH (ref 0–19)
Microalb/Creat Ratio: >300 mg/g — ABNORMAL HIGH (ref <30)

## 2019-07-03 LAB — BAYER DCA HB A1C WAIVED: HB A1C (BAYER DCA - WAIVED): 9.1 % — ABNORMAL HIGH (ref <7.0)

## 2019-07-03 MED ORDER — CLOPIDOGREL BISULFATE 75 MG PO TABS: 75.0000 mg | ORAL_TABLET | Freq: Every day | ORAL | 3 refills | Status: DC

## 2019-07-03 MED ORDER — ISOSORBIDE MONONITRATE ER 30 MG PO TB24: 45.0000 mg | ORAL_TABLET | Freq: Every day | ORAL | 1 refills | Status: DC

## 2019-07-03 MED ORDER — SITAGLIPTIN PHOSPHATE 25 MG PO TABS: 25.0000 mg | ORAL_TABLET | Freq: Every day | ORAL | 1 refills | Status: DC

## 2019-07-03 MED ORDER — NYSTATIN 100000 UNIT/GM EX POWD: 1.0000 "application " | Freq: Three times a day (TID) | CUTANEOUS | 0 refills | Status: DC

## 2019-07-03 NOTE — Progress Notes (Signed)
BP (!) 151/69 (BP Location: Left Arm, Patient Position: Sitting, Cuff Size: Normal)   Pulse 73   Temp 97.8 F (36.6 C) (Oral)   Ht 5' 4.17" (1.63 m)   Wt 137 lb 3.2 oz (62.2 kg)   SpO2 98%   BMI 23.42 kg/m    Subjective:    Patient ID: Ray Lamb, male    DOB: 1931/01/24, 86 y.o.   MRN: [id number]  HPI: Ray Lamb is a 86 y.o. male  Chief Complaint  Patient presents with  . Hyperlipidemia  . Hypertension  . Diabetes   On dialysis, doing well on it. Feeling pretty well  Has a rash on his groin for the past couple of weeks. Otherwise has been feeling well.  HYPERTENSION / HYPERLIPIDEMIA Satisfied with current treatment? yes Duration of hypertension: chronic BP monitoring frequency: occasionally BP medication side effects: no Past BP meds: imdur, carvedilol Duration of hyperlipidemia: chronic Cholesterol medication side effects: no Cholesterol supplements: none Past cholesterol medications: atorvastatin Medication compliance: excellent compliance Aspirin: yes Recent stressors: no Recurrent headaches: no Visual changes: no Palpitations: no Dyspnea: no Chest pain: no Lower extremity edema: no Dizzy/lightheaded: no  DIABETES- had been just taking [place] Hypoglycemic episodes:no Polydipsia/polyuria: no Visual disturbance: no Chest pain: no Paresthesias: no Glucose Monitoring: yes  Accucheck frequency: Daily  Fasting glucose: 250 Taking Insulin?: no Blood Pressure Monitoring: a few times a week Retinal Examination: Up to Date Foot Exam: Not Up to Date Diabetic Education: Completed Pneumovax: Up to Date Influenza: Up to Date Aspirin: yes   Relevant past medical, surgical, family and social history reviewed and updated as indicated. Interim medical history since our last visit reviewed. Allergies and medications reviewed and updated.  Review of Systems  Constitutional: Negative.   HENT: Negative.   Respiratory: Negative.   Cardiovascular:  Negative.   Gastrointestinal: Negative.   Musculoskeletal: Negative.   Psychiatric/Behavioral: Negative.     Per HPI unless specifically indicated above     Objective:    BP (!) 151/69 (BP Location: Left Arm, Patient Position: Sitting, Cuff Size: Normal)   Pulse 73   Temp 97.8 F (36.6 C) (Oral)   Ht 5' 4.17" (1.63 m)   Wt 137 lb 3.2 oz (62.2 kg)   SpO2 98%   BMI 23.42 kg/m   Wt Readings from Last 3 Encounters:  07/03/19 137 lb 3.2 oz (62.2 kg)  06/03/19 130 lb (59 kg)  03/07/19 150 lb (68 kg)    Physical Exam Vitals and nursing note reviewed.  Constitutional:      General: He is not in acute distress.    Appearance: Normal appearance. He is not ill-appearing, toxic-appearing or diaphoretic.  HENT:     Head: Normocephalic and atraumatic.     Right Ear: External ear normal.     Left Ear: External ear normal.     Nose: Nose normal.     Mouth/Throat:     Mouth: Mucous membranes are moist.     Pharynx: Oropharynx is clear.  Eyes:     General: No scleral icterus.       Right eye: No discharge.        Left eye: No discharge.     Extraocular Movements: Extraocular movements intact.     Conjunctiva/sclera: Conjunctivae normal.     Pupils: Pupils are equal, round, and reactive to light.  Cardiovascular:     Rate and Rhythm: Normal rate and regular rhythm.     Pulses: Normal pulses.  Heart sounds: Normal heart sounds. No murmur. No friction rub. No gallop.   Pulmonary:     Effort: Pulmonary effort is normal. No respiratory distress.     Breath sounds: Normal breath sounds. No stridor. No wheezing, rhonchi or rales.  Chest:     Chest wall: No tenderness.  Musculoskeletal:        General: Normal range of motion.     Cervical back: Normal range of motion and neck supple.  Skin:    General: Skin is warm and dry.     Capillary Refill: Capillary refill takes less than 2 seconds.     Coloration: Skin is not jaundiced or pale.     Findings: Bruising present. No erythema,  lesion or rash.  Neurological:     General: No focal deficit present.     Mental Status: He is alert and oriented to person, place, and time. Mental status is at baseline.  Psychiatric:        Mood and Affect: Mood normal.        Behavior: Behavior normal.        Thought Content: Thought content normal.        Judgment: Judgment normal.     Results for orders placed or performed in visit on 07/03/19  Bayer DCA Hb A1c Waived  Result Value Ref Range   HB A1C (BAYER DCA - WAIVED) 9.1 (H) <7.0 %  Comprehensive metabolic panel  Result Value Ref Range   Glucose 193 (H) 65 - 99 mg/dL   BUN 46 (H) 8 - 27 mg/dL   Creatinine, Ser 4.36 (H) 0.76 - 1.27 mg/dL   GFR calc non Af Amer 11 (L) >59 mL/min/1.73   GFR calc Af Amer 13 (L) >59 mL/min/1.73   BUN/Creatinine Ratio 11 10 - 24   Sodium 134 134 - 144 mmol/L   Potassium 3.7 3.5 - 5.2 mmol/L   Chloride 91 (L) 96 - 106 mmol/L   CO2 29 20 - 29 mmol/L   Calcium 8.3 (L) 8.6 - 10.2 mg/dL   Total Protein 7.6 6.0 - 8.5 g/dL   Albumin 3.2 (L) 3.6 - 4.6 g/dL   Globulin, Total 4.4 1.5 - 4.5 g/dL   Albumin/Globulin Ratio 0.7 (L) 1.2 - 2.2   Bilirubin Total 0.3 0.0 - 1.2 mg/dL   Alkaline Phosphatase 94 48 - 121 IU/L   AST 30 0 - 40 IU/L   ALT 22 0 - 44 IU/L  CBC with Differential/Platelet  Result Value Ref Range   WBC 7.0 3.4 - 10.8 x10E3/uL   RBC 3.83 (L) 4.14 - 5.80 x10E6/uL   Hemoglobin 11.3 (L) 13.0 - 17.7 g/dL   Hematocrit 34.8 (L) 37.5 - 51.0 %   MCV 91 79 - 97 fL   MCH 29.5 26.6 - 33.0 pg   MCHC 32.5 31.5 - 35.7 g/dL   RDW 12.8 11.6 - 15.4 %   Platelets 149 (L) 150 - 450 x10E3/uL   Neutrophils 46 Not Estab. %   Lymphs 36 Not Estab. %   Monocytes 12 Not Estab. %   Eos 4 Not Estab. %   Basos 1 Not Estab. %   Neutrophils Absolute 3.2 1.4 - 7.0 x10E3/uL   Lymphocytes Absolute 2.5 0.7 - 3.1 x10E3/uL   Monocytes Absolute 0.9 0.1 - 0.9 x10E3/uL   EOS (ABSOLUTE) 0.3 0.0 - 0.4 x10E3/uL   Basophils Absolute 0.1 0.0 - 0.2 x10E3/uL    Immature Granulocytes 1 Not Estab. %   Immature Grans (Abs)  0.0 0.0 - 0.1 x10E3/uL  Lipid Panel w/o Chol/HDL Ratio  Result Value Ref Range   Cholesterol, Total 97 (L) 100 - 199 mg/dL   Triglycerides 74 0 - 149 mg/dL   HDL 36 (L) >39 mg/dL   VLDL Cholesterol Cal 16 5 - 40 mg/dL   LDL Chol Calc (NIH) 45 0 - 99 mg/dL  Microalbumin, Urine Waived  Result Value Ref Range   Microalb, Ur Waived 150 (H) 0 - 19 mg/L   Creatinine, Urine Waived 50 10 - 300 mg/dL   Microalb/Creat Ratio >300 (H) <30 mg/g      Assessment & Plan:   Problem List Items Addressed This Visit      Cardiovascular and Mediastinum   Senile purpura ([place])    Reassured patient. Continue to monitor. Call with any concerns.       Relevant Medications   isosorbide mononitrate (IMDUR) 30 MG 24 hr tablet   Benign hypertension with CKD (chronic kidney disease) stage IV (HCC) - Primary    Running a little high today. Will recheck 1 month. Continue to monitor. Call with any concerns.       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 Continue to monitor. Call with any concerns.       Relevant Medications   isosorbide mononitrate (IMDUR) 30 MG 24 hr tablet   Chronic systolic (congestive) heart failure (HCC)    Euvolemic today. Looking good. Continue to monitor. Continue to follow with cardiology.      Relevant Medications   isosorbide mononitrate (IMDUR) 30 MG 24 hr tablet   Other Relevant Orders   Comprehensive metabolic panel (Completed)   CBC with Differential/Platelet (Completed)     Endocrine   Diabetes ([place])    Not under good control. Will restart his jardiance and recheck 1 month. Call with  any concerns.       Relevant Medications   sitaGLIPtin (JANUVIA) 25 MG tablet   Other Relevant Orders   Bayer DCA Hb A1c Waived (Completed)   Comprehensive metabolic panel (Completed)   CBC with Differential/Platelet (Completed)   Microalbumin, Urine Waived (Completed)     Genitourinary   Chronic kidney disease    On dialysis. Continue to follow with nephrology. Continue dialysis. Call with any concerns.       Relevant Orders   Comprehensive metabolic panel (Completed)   CBC with Differential/Platelet (Completed)   Microalbumin, Urine Waived (Completed)   ESRD (end stage renal disease) ([place])    On dialysis. Continue to follow with nephrology. Continue dialysis. Call with any concerns.         Other   Mixed hyperlipidemia    Under good control on current regimen. Continue current regimen. Continue to monitor. Call with any concerns. Refills given. Labs drawn today.       Relevant Medications   isosorbide mononitrate (IMDUR) 30 MG 24 hr tablet   Other Relevant Orders   Comprehensive metabolic panel (Completed)   CBC with Differential/Platelet (Completed)   Lipid Panel w/o Chol/HDL Ratio (Completed)    Other Visit Diagnoses    Candidal intertrigo       Will treat with nystatin powder. Call if not getting better or getting  worse.    Relevant Medications   nystatin (MYCOSTATIN/NYSTOP) powder       Follow up plan: Return in about 2 weeks (around 07/17/2019) for by phone/virtual.

## 2019-07-04 DIAGNOSIS — D509 Iron deficiency anemia, unspecified: Secondary | ICD-10-CM | POA: Diagnosis not present

## 2019-07-04 DIAGNOSIS — D631 Anemia in chronic kidney disease: Secondary | ICD-10-CM | POA: Diagnosis not present

## 2019-07-04 DIAGNOSIS — N186 End stage renal disease: Secondary | ICD-10-CM | POA: Diagnosis not present

## 2019-07-04 DIAGNOSIS — Z992 Dependence on renal dialysis: Secondary | ICD-10-CM | POA: Diagnosis not present

## 2019-07-04 LAB — CBC WITH DIFFERENTIAL/PLATELET
Basophils Absolute: 0.1 10*3/uL (ref 0.0–0.2)
Basos: 1 %
EOS (ABSOLUTE): 0.3 10*3/uL (ref 0.0–0.4)
Eos: 4 %
Hematocrit: 34.8 % — ABNORMAL LOW (ref 37.5–51.0)
Hemoglobin: 11.3 g/dL — ABNORMAL LOW (ref 13.0–17.7)
Immature Grans (Abs): 0 10*3/uL (ref 0.0–0.1)
Immature Granulocytes: 1 %
Lymphocytes Absolute: 2.5 10*3/uL (ref 0.7–3.1)
Lymphs: 36 %
MCH: 29.5 pg (ref 26.6–33.0)
MCHC: 32.5 g/dL (ref 31.5–35.7)
MCV: 91 fL (ref 79–97)
Monocytes Absolute: 0.9 10*3/uL (ref 0.1–0.9)
Monocytes: 12 %
Neutrophils Absolute: 3.2 10*3/uL (ref 1.4–7.0)
Neutrophils: 46 %
Platelets: 149 10*3/uL — ABNORMAL LOW (ref 150–450)
RBC: 3.83 x10E6/uL — ABNORMAL LOW (ref 4.14–5.80)
RDW: 12.8 % (ref 11.6–15.4)
WBC: 7 10*3/uL (ref 3.4–10.8)

## 2019-07-04 LAB — COMPREHENSIVE METABOLIC PANEL
ALT: 22 IU/L (ref 0–44)
AST: 30 IU/L (ref 0–40)
Albumin/Globulin Ratio: 0.7 — ABNORMAL LOW (ref 1.2–2.2)
Albumin: 3.2 g/dL — ABNORMAL LOW (ref 3.6–4.6)
Alkaline Phosphatase: 94 IU/L (ref 48–121)
BUN/Creatinine Ratio: 11 (ref 10–24)
BUN: 46 mg/dL — ABNORMAL HIGH (ref 8–27)
Bilirubin Total: 0.3 mg/dL (ref 0.0–1.2)
CO2: 29 mmol/L (ref 20–29)
Calcium: 8.3 mg/dL — ABNORMAL LOW (ref 8.6–10.2)
Chloride: 91 mmol/L — ABNORMAL LOW (ref 96–106)
Creatinine, Ser: 4.36 mg/dL — ABNORMAL HIGH (ref 0.76–1.27)
GFR calc Af Amer: 13 mL/min/{1.73_m2} — ABNORMAL LOW (ref >59)
GFR calc non Af Amer: 11 mL/min/{1.73_m2} — ABNORMAL LOW (ref >59)
Globulin, Total: 4.4 g/dL (ref 1.5–4.5)
Glucose: 193 mg/dL — ABNORMAL HIGH (ref 65–99)
Potassium: 3.7 mmol/L (ref 3.5–5.2)
Sodium: 134 mmol/L (ref 134–144)
Total Protein: 7.6 g/dL (ref 6.0–8.5)

## 2019-07-04 LAB — LIPID PANEL W/O CHOL/HDL RATIO
Cholesterol, Total: 97 mg/dL — ABNORMAL LOW (ref 100–199)
HDL: 36 mg/dL — ABNORMAL LOW (ref >39)
LDL Chol Calc (NIH): 45 mg/dL (ref 0–99)
Triglycerides: 74 mg/dL (ref 0–149)
VLDL Cholesterol Cal: 16 mg/dL (ref 5–40)

## 2019-07-05 DIAGNOSIS — Z992 Dependence on renal dialysis: Secondary | ICD-10-CM | POA: Diagnosis not present

## 2019-07-05 DIAGNOSIS — D631 Anemia in chronic kidney disease: Secondary | ICD-10-CM | POA: Diagnosis not present

## 2019-07-05 DIAGNOSIS — D509 Iron deficiency anemia, unspecified: Secondary | ICD-10-CM | POA: Diagnosis not present

## 2019-07-05 DIAGNOSIS — N186 End stage renal disease: Secondary | ICD-10-CM | POA: Diagnosis not present

## 2019-07-06 DIAGNOSIS — D509 Iron deficiency anemia, unspecified: Secondary | ICD-10-CM | POA: Diagnosis not present

## 2019-07-06 DIAGNOSIS — N186 End stage renal disease: Secondary | ICD-10-CM | POA: Diagnosis not present

## 2019-07-06 DIAGNOSIS — D631 Anemia in chronic kidney disease: Secondary | ICD-10-CM | POA: Diagnosis not present

## 2019-07-06 DIAGNOSIS — Z992 Dependence on renal dialysis: Secondary | ICD-10-CM | POA: Diagnosis not present

## 2019-07-06 NOTE — Assessment & Plan Note (Signed)
Will keep BP and cholesterol and sugars under good control. Continue to monitor. Call with any concerns.  

## 2019-07-06 NOTE — Assessment & Plan Note (Signed)
Running a little high today. Will recheck 1 month. Continue to monitor. Call with any concerns.

## 2019-07-06 NOTE — Assessment & Plan Note (Signed)
Not under good control. Will restart his jardiance and recheck 1 month. Call with any concerns.

## 2019-07-06 NOTE — Assessment & Plan Note (Signed)
Under good control on current regimen. Continue current regimen. Continue to monitor. Call with any concerns. Refills given. Labs drawn today.   

## 2019-07-06 NOTE — Assessment & Plan Note (Addendum)
On dialysis. Continue to follow with nephrology. Continue dialysis. Call with any concerns.

## 2019-07-06 NOTE — Assessment & Plan Note (Signed)
Euvolemic today. Looking good. Continue to monitor. Continue to follow with cardiology.

## 2019-07-06 NOTE — Assessment & Plan Note (Signed)
On dialysis. Continue to follow with nephrology. Continue dialysis. Call with any concerns.

## 2019-07-06 NOTE — Assessment & Plan Note (Signed)
Reassured patient. Continue to monitor. Call with any concerns.  

## 2019-07-07 DIAGNOSIS — R2681 Unsteadiness on feet: Secondary | ICD-10-CM | POA: Diagnosis not present

## 2019-07-07 DIAGNOSIS — D631 Anemia in chronic kidney disease: Secondary | ICD-10-CM | POA: Diagnosis not present

## 2019-07-07 DIAGNOSIS — D509 Iron deficiency anemia, unspecified: Secondary | ICD-10-CM | POA: Diagnosis not present

## 2019-07-07 DIAGNOSIS — N186 End stage renal disease: Secondary | ICD-10-CM | POA: Diagnosis not present

## 2019-07-07 DIAGNOSIS — M6281 Muscle weakness (generalized): Secondary | ICD-10-CM | POA: Diagnosis not present

## 2019-07-07 DIAGNOSIS — Z992 Dependence on renal dialysis: Secondary | ICD-10-CM | POA: Diagnosis not present

## 2019-07-08 DIAGNOSIS — D631 Anemia in chronic kidney disease: Secondary | ICD-10-CM | POA: Diagnosis not present

## 2019-07-08 DIAGNOSIS — Z992 Dependence on renal dialysis: Secondary | ICD-10-CM | POA: Diagnosis not present

## 2019-07-08 DIAGNOSIS — N186 End stage renal disease: Secondary | ICD-10-CM | POA: Diagnosis not present

## 2019-07-08 DIAGNOSIS — D509 Iron deficiency anemia, unspecified: Secondary | ICD-10-CM | POA: Diagnosis not present

## 2019-07-09 DIAGNOSIS — D631 Anemia in chronic kidney disease: Secondary | ICD-10-CM | POA: Diagnosis not present

## 2019-07-09 DIAGNOSIS — M6281 Muscle weakness (generalized): Secondary | ICD-10-CM | POA: Diagnosis not present

## 2019-07-09 DIAGNOSIS — R2681 Unsteadiness on feet: Secondary | ICD-10-CM | POA: Diagnosis not present

## 2019-07-09 DIAGNOSIS — Z992 Dependence on renal dialysis: Secondary | ICD-10-CM | POA: Diagnosis not present

## 2019-07-09 DIAGNOSIS — D509 Iron deficiency anemia, unspecified: Secondary | ICD-10-CM | POA: Diagnosis not present

## 2019-07-09 DIAGNOSIS — N186 End stage renal disease: Secondary | ICD-10-CM | POA: Diagnosis not present

## 2019-07-10 DIAGNOSIS — D509 Iron deficiency anemia, unspecified: Secondary | ICD-10-CM | POA: Diagnosis not present

## 2019-07-10 DIAGNOSIS — N186 End stage renal disease: Secondary | ICD-10-CM | POA: Diagnosis not present

## 2019-07-10 DIAGNOSIS — D631 Anemia in chronic kidney disease: Secondary | ICD-10-CM | POA: Diagnosis not present

## 2019-07-10 DIAGNOSIS — Z992 Dependence on renal dialysis: Secondary | ICD-10-CM | POA: Diagnosis not present

## 2019-07-11 DIAGNOSIS — Z992 Dependence on renal dialysis: Secondary | ICD-10-CM | POA: Diagnosis not present

## 2019-07-11 DIAGNOSIS — N186 End stage renal disease: Secondary | ICD-10-CM | POA: Diagnosis not present

## 2019-07-11 DIAGNOSIS — D631 Anemia in chronic kidney disease: Secondary | ICD-10-CM | POA: Diagnosis not present

## 2019-07-11 DIAGNOSIS — D509 Iron deficiency anemia, unspecified: Secondary | ICD-10-CM | POA: Diagnosis not present

## 2019-07-12 DIAGNOSIS — D509 Iron deficiency anemia, unspecified: Secondary | ICD-10-CM | POA: Diagnosis not present

## 2019-07-12 DIAGNOSIS — Z992 Dependence on renal dialysis: Secondary | ICD-10-CM | POA: Diagnosis not present

## 2019-07-12 DIAGNOSIS — D631 Anemia in chronic kidney disease: Secondary | ICD-10-CM | POA: Diagnosis not present

## 2019-07-12 DIAGNOSIS — N186 End stage renal disease: Secondary | ICD-10-CM | POA: Diagnosis not present

## 2019-07-13 DIAGNOSIS — Z992 Dependence on renal dialysis: Secondary | ICD-10-CM | POA: Diagnosis not present

## 2019-07-13 DIAGNOSIS — D631 Anemia in chronic kidney disease: Secondary | ICD-10-CM | POA: Diagnosis not present

## 2019-07-13 DIAGNOSIS — D509 Iron deficiency anemia, unspecified: Secondary | ICD-10-CM | POA: Diagnosis not present

## 2019-07-13 DIAGNOSIS — N186 End stage renal disease: Secondary | ICD-10-CM | POA: Diagnosis not present

## 2019-07-14 DIAGNOSIS — D509 Iron deficiency anemia, unspecified: Secondary | ICD-10-CM | POA: Diagnosis not present

## 2019-07-14 DIAGNOSIS — N186 End stage renal disease: Secondary | ICD-10-CM | POA: Diagnosis not present

## 2019-07-14 DIAGNOSIS — Z992 Dependence on renal dialysis: Secondary | ICD-10-CM | POA: Diagnosis not present

## 2019-07-14 DIAGNOSIS — D631 Anemia in chronic kidney disease: Secondary | ICD-10-CM | POA: Diagnosis not present

## 2019-07-16 ENCOUNTER — Telehealth (INDEPENDENT_AMBULATORY_CARE_PROVIDER_SITE_OTHER): Payer: Medicare Other | Admitting: Family Medicine

## 2019-07-16 ENCOUNTER — Encounter: Payer: Self-pay | Admitting: Family Medicine

## 2019-07-16 VITALS — BP 137/72 | HR 60

## 2019-07-16 DIAGNOSIS — N185 Chronic kidney disease, stage 5: Secondary | ICD-10-CM

## 2019-07-16 DIAGNOSIS — E1122 Type 2 diabetes mellitus with diabetic chronic kidney disease: Secondary | ICD-10-CM

## 2019-07-16 DIAGNOSIS — I25709 Atherosclerosis of coronary artery bypass graft(s), unspecified, with unspecified angina pectoris: Secondary | ICD-10-CM

## 2019-07-16 NOTE — Progress Notes (Signed)
BP 137/72   Pulse 60    Subjective:    Patient ID: Ray Lamb, male    DOB: 10/09/1930, 86 y.o.   MRN: [id number]  HPI: Ray Lamb is a 86 y.o. male  Chief Complaint  Patient presents with  . Diabetes    Sugar check  (fasting this morning 177)    DIABETES Hypoglycemic episodes:no Polydipsia/polyuria: no Visual disturbance: no Chest pain: no Paresthesias: yes Glucose Monitoring: yes  Accucheck frequency: Not Checking  Fasting glucose: 177 this AM, 240s-260s Taking Insulin?: no Blood Pressure Monitoring: not checking Retinal Examination: Up to Date Foot Exam: Up to Date Diabetic Education: Completed Pneumovax: Up to Date Influenza: Up to Date Aspirin: yes  Relevant past medical, surgical, family and social history reviewed and updated as indicated. Interim medical history since our last visit reviewed. Allergies and medications reviewed and updated.  Review of Systems  Constitutional: Negative.   Respiratory: Negative.   Cardiovascular: Negative.   Gastrointestinal: Negative.   Musculoskeletal: Negative.   Psychiatric/Behavioral: Negative.     Per HPI unless specifically indicated above     Objective:    BP 137/72   Pulse 60   Wt Readings from Last 3 Encounters:  07/03/19 137 lb 3.2 oz (62.2 kg)  06/03/19 130 lb (59 kg)  03/07/19 150 lb (68 kg)    Physical Exam Vitals and nursing note reviewed.  Constitutional:      General: He is not in acute distress.    Appearance: Normal appearance. He is not ill-appearing, toxic-appearing or diaphoretic.  HENT:     Head: Normocephalic and atraumatic.     Right Ear: External ear normal.     Left Ear: External ear normal.     Nose: Nose normal.     Mouth/Throat:     Mouth: Mucous membranes are moist.     Pharynx: Oropharynx is clear.  Eyes:     General: No scleral icterus.       Right eye: No discharge.        Left eye: No discharge.     Conjunctiva/sclera: Conjunctivae normal.     Pupils: Pupils are  equal, round, and reactive to light.  Pulmonary:     Effort: Pulmonary effort is normal. No respiratory distress.     Comments: Speaking in full sentences Musculoskeletal:        General: Normal range of motion.     Cervical back: Normal range of motion.  Skin:    Coloration: Skin is not jaundiced or pale.     Findings: No bruising, erythema, lesion or rash.  Neurological:     Mental Status: He is alert and oriented to person, place, and time. Mental status is at baseline.  Psychiatric:        Mood and Affect: Mood normal.        Behavior: Behavior normal.        Thought Content: Thought content normal.        Judgment: Judgment normal.     Results for orders placed or performed in visit on 07/03/19  Bayer DCA Hb A1c Waived  Result Value Ref Range   HB A1C (BAYER DCA - WAIVED) 9.1 (H) <7.0 %  Comprehensive metabolic panel  Result Value Ref Range   Glucose 193 (H) 65 - 99 mg/dL   BUN 46 (H) 8 - 27 mg/dL   Creatinine, Ser 4.36 (H) 0.76 - 1.27 mg/dL   GFR calc non Af Amer 11 (L) >59 mL/min/1.73  GFR calc Af Amer 13 (L) >59 mL/min/1.73   BUN/Creatinine Ratio 11 10 - 24   Sodium 134 134 - 144 mmol/L   Potassium 3.7 3.5 - 5.2 mmol/L   Chloride 91 (L) 96 - 106 mmol/L   CO2 29 20 - 29 mmol/L   Calcium 8.3 (L) 8.6 - 10.2 mg/dL   Total Protein 7.6 6.0 - 8.5 g/dL   Albumin 3.2 (L) 3.6 - 4.6 g/dL   Globulin, Total 4.4 1.5 - 4.5 g/dL   Albumin/Globulin Ratio 0.7 (L) 1.2 - 2.2   Bilirubin Total 0.3 0.0 - 1.2 mg/dL   Alkaline Phosphatase 94 48 - 121 IU/L   AST 30 0 - 40 IU/L   ALT 22 0 - 44 IU/L  CBC with Differential/Platelet  Result Value Ref Range   WBC 7.0 3.4 - 10.8 x10E3/uL   RBC 3.83 (L) 4.14 - 5.80 x10E6/uL   Hemoglobin 11.3 (L) 13.0 - 17.7 g/dL   Hematocrit 34.8 (L) 37.5 - 51.0 %   MCV 91 79 - 97 fL   MCH 29.5 26.6 - 33.0 pg   MCHC 32.5 31.5 - 35.7 g/dL   RDW 12.8 11.6 - 15.4 %   Platelets 149 (L) 150 - 450 x10E3/uL   Neutrophils 46 Not Estab. %   Lymphs 36 Not  Estab. %   Monocytes 12 Not Estab. %   Eos 4 Not Estab. %   Basos 1 Not Estab. %   Neutrophils Absolute 3.2 1.4 - 7.0 x10E3/uL   Lymphocytes Absolute 2.5 0.7 - 3.1 x10E3/uL   Monocytes Absolute 0.9 0.1 - 0.9 x10E3/uL   EOS (ABSOLUTE) 0.3 0.0 - 0.4 x10E3/uL   Basophils Absolute 0.1 0.0 - 0.2 x10E3/uL   Immature Granulocytes 1 Not Estab. %   Immature Grans (Abs) 0.0 0.0 - 0.1 x10E3/uL  Lipid Panel w/o Chol/HDL Ratio  Result Value Ref Range   Cholesterol, Total 97 (L) 100 - 199 mg/dL   Triglycerides 74 0 - 149 mg/dL   HDL 36 (L) >39 mg/dL   VLDL Cholesterol Cal 16 5 - 40 mg/dL   LDL Chol Calc (NIH) 45 0 - 99 mg/dL  Microalbumin, Urine Waived  Result Value Ref Range   Microalb, Ur Waived 150 (H) 0 - 19 mg/L   Creatinine, Urine Waived 50 10 - 300 mg/dL   Microalb/Creat Ratio >300 (H) <30 mg/g      Assessment & Plan:   Problem List Items Addressed This Visit      Endocrine   Diabetes ([place]) - Primary    Sugars still running high. Will add tradjenta and recheck 3 weeks. Call with any concerns.       Relevant Medications   linagliptin (TRADJENTA) 5 MG TABS tablet       Follow up plan: Return in about 3 weeks (around 08/06/2019).   . This visit was completed via MyChart due to the restrictions of the COVID-19 pandemic. All issues as above were discussed and addressed. Physical exam was done as above through visual confirmation on MyChart. If it was felt that the patient should be evaluated in the office, they were directed there. The patient verbally consented to this visit. . Location of the patient: home . Location of the provider: work . Those involved with this call:  . Provider: Park Liter, DO . CMA: Lauretta Grill, RMA . Front Desk/Registration: Don Perking  . Time spent on call: 15 minutes with patient face to face via video conference. More than 50% of this  time was spent in counseling and coordination of care. 23 minutes total spent in review of patient's  record and preparation of their chart.

## 2019-07-17 DIAGNOSIS — Z992 Dependence on renal dialysis: Secondary | ICD-10-CM | POA: Diagnosis not present

## 2019-07-18 MED ORDER — LINAGLIPTIN 5 MG PO TABS: 5.0000 mg | ORAL_TABLET | Freq: Every day | ORAL | 3 refills | Status: DC

## 2019-07-18 NOTE — Assessment & Plan Note (Addendum)
Sugars still running high. Will add tradjenta and recheck 3 weeks. Call with any concerns.

## 2019-07-22 ENCOUNTER — Telehealth: Payer: Self-pay

## 2019-07-22 NOTE — Telephone Encounter (Signed)
Prior Authorization initiated via CoverMyMeds for Tradjenta [place]: [employer]

## 2019-07-22 NOTE — Telephone Encounter (Signed)
PA Approved

## 2019-07-23 ENCOUNTER — Ambulatory Visit: Payer: Self-pay

## 2019-07-23 NOTE — Chronic Care Management (AMB) (Signed)
  Care Management   Follow Up Note   07/23/2019 Name: MARWAN LIPE MRN: [id number] DOB: 1930-07-17  Referred by: [name], MD Reason for referral : Nicholas is a 86 y.o. year old male who is a primary care patient of Crissman, Jeannette How, MD. The care management team was consulted for assistance with care management and care coordination needs.    Review of patient status, including review of consultants reports, relevant laboratory and other test results, and collaboration with appropriate care team members and the patient's provider was performed as part of comprehensive patient evaluation and provision of chronic care management services.    LCSW completed CCM outreach attempt today but was unable to reach patient successfully. A HIPPA compliant voice message was left encouraging patient to return call once available. LCSW rescheduled CCM SW appointment as well.  A HIPPA compliant phone message was left for the patient providing contact information and requesting a return call.   Eula Fried, BSW, MSW, [facility] Care Management [email or web address] Phone: [phone]

## 2019-07-29 ENCOUNTER — Telehealth: Payer: Self-pay | Admitting: Family Medicine

## 2019-07-29 NOTE — Telephone Encounter (Signed)
Pt is on dialysis and the pt is on both tradjenta and [place] and per pt the pharm is telling him both medication is from the same family and to reach out to his provider to see if he can take both medication. [facility] [address] in [place]

## 2019-07-30 DIAGNOSIS — L89159 Pressure ulcer of sacral region, unspecified stage: Secondary | ICD-10-CM | POA: Diagnosis not present

## 2019-07-30 DIAGNOSIS — Z85828 Personal history of other malignant neoplasm of skin: Secondary | ICD-10-CM | POA: Diagnosis not present

## 2019-07-30 DIAGNOSIS — Z08 Encounter for follow-up examination after completed treatment for malignant neoplasm: Secondary | ICD-10-CM | POA: Diagnosis not present

## 2019-07-30 DIAGNOSIS — L309 Dermatitis, unspecified: Secondary | ICD-10-CM | POA: Diagnosis not present

## 2019-07-30 DIAGNOSIS — X32XXXA Exposure to sunlight, initial encounter: Secondary | ICD-10-CM | POA: Diagnosis not present

## 2019-07-30 DIAGNOSIS — D692 Other nonthrombocytopenic purpura: Secondary | ICD-10-CM | POA: Diagnosis not present

## 2019-07-30 DIAGNOSIS — L570 Actinic keratosis: Secondary | ICD-10-CM | POA: Diagnosis not present

## 2019-07-30 NOTE — Telephone Encounter (Signed)
Patient notified and verbalized understanding. Patient stated that he has not started taking the tradjenta yet. Pt stated he just got this med and will keep holding it as advised by Apolonio Schneiders. FYI

## 2019-07-30 NOTE — Telephone Encounter (Signed)
I'd recommend holding tradjenta for now and will route to PCP for further guidance. Tradjenta looks to have been added just a few weeks ago.

## 2019-07-31 ENCOUNTER — Telehealth: Payer: Self-pay | Admitting: Family Medicine

## 2019-07-31 ENCOUNTER — Ambulatory Visit: Payer: Medicare Other | Admitting: Family Medicine

## 2019-07-31 NOTE — Telephone Encounter (Signed)
Copied from [place] [id number]. Topic: General - Other >> Jul 31, 2019 10:56 AM Rainey Pines A wrote: Patient stated that he wanted a status update on his diabetic supplies that was to be sent in for him. Patient stated that he was to get a one touch ultra 2 meter. Did not see in patients chart. Please advise

## 2019-07-31 NOTE — Telephone Encounter (Signed)
Order faxed to [facility].

## 2019-08-01 ENCOUNTER — Other Ambulatory Visit: Payer: Self-pay | Admitting: Family Medicine

## 2019-08-05 ENCOUNTER — Other Ambulatory Visit: Payer: Self-pay | Admitting: Family Medicine

## 2019-08-05 DIAGNOSIS — L120 Bullous pemphigoid: Secondary | ICD-10-CM | POA: Diagnosis not present

## 2019-08-05 DIAGNOSIS — L308 Other specified dermatitis: Secondary | ICD-10-CM | POA: Diagnosis not present

## 2019-08-14 DIAGNOSIS — Z992 Dependence on renal dialysis: Secondary | ICD-10-CM | POA: Diagnosis not present

## 2019-08-14 DIAGNOSIS — N2581 Secondary hyperparathyroidism of renal origin: Secondary | ICD-10-CM | POA: Diagnosis not present

## 2019-08-14 DIAGNOSIS — N186 End stage renal disease: Secondary | ICD-10-CM | POA: Diagnosis not present

## 2019-08-14 DIAGNOSIS — D509 Iron deficiency anemia, unspecified: Secondary | ICD-10-CM | POA: Diagnosis not present

## 2019-08-14 DIAGNOSIS — E559 Vitamin D deficiency, unspecified: Secondary | ICD-10-CM | POA: Diagnosis not present

## 2019-08-15 DIAGNOSIS — N186 End stage renal disease: Secondary | ICD-10-CM | POA: Diagnosis not present

## 2019-08-15 DIAGNOSIS — E559 Vitamin D deficiency, unspecified: Secondary | ICD-10-CM | POA: Diagnosis not present

## 2019-08-15 DIAGNOSIS — Z992 Dependence on renal dialysis: Secondary | ICD-10-CM | POA: Diagnosis not present

## 2019-08-15 DIAGNOSIS — N2581 Secondary hyperparathyroidism of renal origin: Secondary | ICD-10-CM | POA: Diagnosis not present

## 2019-08-15 DIAGNOSIS — D509 Iron deficiency anemia, unspecified: Secondary | ICD-10-CM | POA: Diagnosis not present

## 2019-08-16 DIAGNOSIS — N2581 Secondary hyperparathyroidism of renal origin: Secondary | ICD-10-CM | POA: Diagnosis not present

## 2019-08-16 DIAGNOSIS — E559 Vitamin D deficiency, unspecified: Secondary | ICD-10-CM | POA: Diagnosis not present

## 2019-08-16 DIAGNOSIS — N186 End stage renal disease: Secondary | ICD-10-CM | POA: Diagnosis not present

## 2019-08-16 DIAGNOSIS — D509 Iron deficiency anemia, unspecified: Secondary | ICD-10-CM | POA: Diagnosis not present

## 2019-08-16 DIAGNOSIS — Z992 Dependence on renal dialysis: Secondary | ICD-10-CM | POA: Diagnosis not present

## 2019-08-17 DIAGNOSIS — Z992 Dependence on renal dialysis: Secondary | ICD-10-CM | POA: Diagnosis not present

## 2019-08-17 DIAGNOSIS — E559 Vitamin D deficiency, unspecified: Secondary | ICD-10-CM | POA: Diagnosis not present

## 2019-08-17 DIAGNOSIS — N186 End stage renal disease: Secondary | ICD-10-CM | POA: Diagnosis not present

## 2019-08-17 DIAGNOSIS — D509 Iron deficiency anemia, unspecified: Secondary | ICD-10-CM | POA: Diagnosis not present

## 2019-08-17 DIAGNOSIS — N2581 Secondary hyperparathyroidism of renal origin: Secondary | ICD-10-CM | POA: Diagnosis not present

## 2019-08-18 DIAGNOSIS — E559 Vitamin D deficiency, unspecified: Secondary | ICD-10-CM | POA: Diagnosis not present

## 2019-08-18 DIAGNOSIS — Z992 Dependence on renal dialysis: Secondary | ICD-10-CM | POA: Diagnosis not present

## 2019-08-18 DIAGNOSIS — N2581 Secondary hyperparathyroidism of renal origin: Secondary | ICD-10-CM | POA: Diagnosis not present

## 2019-08-18 DIAGNOSIS — N186 End stage renal disease: Secondary | ICD-10-CM | POA: Diagnosis not present

## 2019-08-18 DIAGNOSIS — D509 Iron deficiency anemia, unspecified: Secondary | ICD-10-CM | POA: Diagnosis not present

## 2019-08-19 ENCOUNTER — Telehealth: Payer: Self-pay | Admitting: Family Medicine

## 2019-08-19 DIAGNOSIS — N186 End stage renal disease: Secondary | ICD-10-CM | POA: Diagnosis not present

## 2019-08-19 DIAGNOSIS — Z992 Dependence on renal dialysis: Secondary | ICD-10-CM | POA: Diagnosis not present

## 2019-08-19 DIAGNOSIS — D509 Iron deficiency anemia, unspecified: Secondary | ICD-10-CM | POA: Diagnosis not present

## 2019-08-19 DIAGNOSIS — E559 Vitamin D deficiency, unspecified: Secondary | ICD-10-CM | POA: Diagnosis not present

## 2019-08-19 DIAGNOSIS — N2581 Secondary hyperparathyroidism of renal origin: Secondary | ICD-10-CM | POA: Diagnosis not present

## 2019-08-19 NOTE — Telephone Encounter (Signed)
Pt request refill  One touch ultra 2 test strips  Pt states the pharmacy told him they need a dx code  Pt states it has been a long time since he had this filled for test strips    [id number] [address], [place] Phone:  [phone]  Fax:  [phone]

## 2019-08-19 NOTE — Telephone Encounter (Signed)
Rx faxed to pharmacy. Pt notified.

## 2019-08-19 NOTE — Telephone Encounter (Signed)
Patient requesting new Rx for test strips: One Touch Ultra 2 with diagnosis code for pharmacy

## 2019-08-19 NOTE — Telephone Encounter (Signed)
Rx filled out and waiting for provider signature.

## 2019-08-20 DIAGNOSIS — D509 Iron deficiency anemia, unspecified: Secondary | ICD-10-CM | POA: Diagnosis not present

## 2019-08-20 DIAGNOSIS — N2581 Secondary hyperparathyroidism of renal origin: Secondary | ICD-10-CM | POA: Diagnosis not present

## 2019-08-20 DIAGNOSIS — Z992 Dependence on renal dialysis: Secondary | ICD-10-CM | POA: Diagnosis not present

## 2019-08-20 DIAGNOSIS — E559 Vitamin D deficiency, unspecified: Secondary | ICD-10-CM | POA: Diagnosis not present

## 2019-08-20 DIAGNOSIS — N186 End stage renal disease: Secondary | ICD-10-CM | POA: Diagnosis not present

## 2019-08-21 DIAGNOSIS — N2581 Secondary hyperparathyroidism of renal origin: Secondary | ICD-10-CM | POA: Diagnosis not present

## 2019-08-21 DIAGNOSIS — Z992 Dependence on renal dialysis: Secondary | ICD-10-CM | POA: Diagnosis not present

## 2019-08-21 DIAGNOSIS — E559 Vitamin D deficiency, unspecified: Secondary | ICD-10-CM | POA: Diagnosis not present

## 2019-08-21 DIAGNOSIS — E119 Type 2 diabetes mellitus without complications: Secondary | ICD-10-CM | POA: Diagnosis not present

## 2019-08-21 DIAGNOSIS — D509 Iron deficiency anemia, unspecified: Secondary | ICD-10-CM | POA: Diagnosis not present

## 2019-08-21 DIAGNOSIS — N186 End stage renal disease: Secondary | ICD-10-CM | POA: Diagnosis not present

## 2019-08-22 DIAGNOSIS — E559 Vitamin D deficiency, unspecified: Secondary | ICD-10-CM | POA: Diagnosis not present

## 2019-08-22 DIAGNOSIS — R2681 Unsteadiness on feet: Secondary | ICD-10-CM | POA: Diagnosis not present

## 2019-08-22 DIAGNOSIS — Z992 Dependence on renal dialysis: Secondary | ICD-10-CM | POA: Diagnosis not present

## 2019-08-22 DIAGNOSIS — N186 End stage renal disease: Secondary | ICD-10-CM | POA: Diagnosis not present

## 2019-08-22 DIAGNOSIS — N2581 Secondary hyperparathyroidism of renal origin: Secondary | ICD-10-CM | POA: Diagnosis not present

## 2019-08-22 DIAGNOSIS — M6281 Muscle weakness (generalized): Secondary | ICD-10-CM | POA: Diagnosis not present

## 2019-08-22 DIAGNOSIS — D509 Iron deficiency anemia, unspecified: Secondary | ICD-10-CM | POA: Diagnosis not present

## 2019-08-23 DIAGNOSIS — N186 End stage renal disease: Secondary | ICD-10-CM | POA: Diagnosis not present

## 2019-08-23 DIAGNOSIS — D509 Iron deficiency anemia, unspecified: Secondary | ICD-10-CM | POA: Diagnosis not present

## 2019-08-23 DIAGNOSIS — E559 Vitamin D deficiency, unspecified: Secondary | ICD-10-CM | POA: Diagnosis not present

## 2019-08-23 DIAGNOSIS — Z992 Dependence on renal dialysis: Secondary | ICD-10-CM | POA: Diagnosis not present

## 2019-08-23 DIAGNOSIS — N2581 Secondary hyperparathyroidism of renal origin: Secondary | ICD-10-CM | POA: Diagnosis not present

## 2019-08-24 DIAGNOSIS — N186 End stage renal disease: Secondary | ICD-10-CM | POA: Diagnosis not present

## 2019-08-24 DIAGNOSIS — Z992 Dependence on renal dialysis: Secondary | ICD-10-CM | POA: Diagnosis not present

## 2019-08-24 DIAGNOSIS — D509 Iron deficiency anemia, unspecified: Secondary | ICD-10-CM | POA: Diagnosis not present

## 2019-08-24 DIAGNOSIS — N2581 Secondary hyperparathyroidism of renal origin: Secondary | ICD-10-CM | POA: Diagnosis not present

## 2019-08-24 DIAGNOSIS — E559 Vitamin D deficiency, unspecified: Secondary | ICD-10-CM | POA: Diagnosis not present

## 2019-08-25 DIAGNOSIS — N186 End stage renal disease: Secondary | ICD-10-CM | POA: Diagnosis not present

## 2019-08-25 DIAGNOSIS — E559 Vitamin D deficiency, unspecified: Secondary | ICD-10-CM | POA: Diagnosis not present

## 2019-08-25 DIAGNOSIS — Z992 Dependence on renal dialysis: Secondary | ICD-10-CM | POA: Diagnosis not present

## 2019-08-25 DIAGNOSIS — N2581 Secondary hyperparathyroidism of renal origin: Secondary | ICD-10-CM | POA: Diagnosis not present

## 2019-08-25 DIAGNOSIS — D509 Iron deficiency anemia, unspecified: Secondary | ICD-10-CM | POA: Diagnosis not present

## 2019-08-26 DIAGNOSIS — Z992 Dependence on renal dialysis: Secondary | ICD-10-CM | POA: Diagnosis not present

## 2019-08-26 DIAGNOSIS — D509 Iron deficiency anemia, unspecified: Secondary | ICD-10-CM | POA: Diagnosis not present

## 2019-08-26 DIAGNOSIS — E559 Vitamin D deficiency, unspecified: Secondary | ICD-10-CM | POA: Diagnosis not present

## 2019-08-26 DIAGNOSIS — N186 End stage renal disease: Secondary | ICD-10-CM | POA: Diagnosis not present

## 2019-08-26 DIAGNOSIS — N2581 Secondary hyperparathyroidism of renal origin: Secondary | ICD-10-CM | POA: Diagnosis not present

## 2019-08-27 DIAGNOSIS — Z992 Dependence on renal dialysis: Secondary | ICD-10-CM | POA: Diagnosis not present

## 2019-08-27 DIAGNOSIS — D509 Iron deficiency anemia, unspecified: Secondary | ICD-10-CM | POA: Diagnosis not present

## 2019-08-27 DIAGNOSIS — N186 End stage renal disease: Secondary | ICD-10-CM | POA: Diagnosis not present

## 2019-08-27 DIAGNOSIS — E559 Vitamin D deficiency, unspecified: Secondary | ICD-10-CM | POA: Diagnosis not present

## 2019-08-27 DIAGNOSIS — N2581 Secondary hyperparathyroidism of renal origin: Secondary | ICD-10-CM | POA: Diagnosis not present

## 2019-08-28 DIAGNOSIS — Z992 Dependence on renal dialysis: Secondary | ICD-10-CM | POA: Diagnosis not present

## 2019-08-28 DIAGNOSIS — D509 Iron deficiency anemia, unspecified: Secondary | ICD-10-CM | POA: Diagnosis not present

## 2019-08-28 DIAGNOSIS — N186 End stage renal disease: Secondary | ICD-10-CM | POA: Diagnosis not present

## 2019-08-28 DIAGNOSIS — R2681 Unsteadiness on feet: Secondary | ICD-10-CM | POA: Diagnosis not present

## 2019-08-28 DIAGNOSIS — N2581 Secondary hyperparathyroidism of renal origin: Secondary | ICD-10-CM | POA: Diagnosis not present

## 2019-08-28 DIAGNOSIS — E559 Vitamin D deficiency, unspecified: Secondary | ICD-10-CM | POA: Diagnosis not present

## 2019-08-28 DIAGNOSIS — M6281 Muscle weakness (generalized): Secondary | ICD-10-CM | POA: Diagnosis not present

## 2019-08-29 DIAGNOSIS — E559 Vitamin D deficiency, unspecified: Secondary | ICD-10-CM | POA: Diagnosis not present

## 2019-08-29 DIAGNOSIS — D509 Iron deficiency anemia, unspecified: Secondary | ICD-10-CM | POA: Diagnosis not present

## 2019-08-29 DIAGNOSIS — N2581 Secondary hyperparathyroidism of renal origin: Secondary | ICD-10-CM | POA: Diagnosis not present

## 2019-08-29 DIAGNOSIS — N186 End stage renal disease: Secondary | ICD-10-CM | POA: Diagnosis not present

## 2019-08-29 DIAGNOSIS — Z992 Dependence on renal dialysis: Secondary | ICD-10-CM | POA: Diagnosis not present

## 2019-08-30 DIAGNOSIS — N186 End stage renal disease: Secondary | ICD-10-CM | POA: Diagnosis not present

## 2019-08-30 DIAGNOSIS — Z992 Dependence on renal dialysis: Secondary | ICD-10-CM | POA: Diagnosis not present

## 2019-08-30 DIAGNOSIS — D509 Iron deficiency anemia, unspecified: Secondary | ICD-10-CM | POA: Diagnosis not present

## 2019-08-30 DIAGNOSIS — N2581 Secondary hyperparathyroidism of renal origin: Secondary | ICD-10-CM | POA: Diagnosis not present

## 2019-08-30 DIAGNOSIS — E559 Vitamin D deficiency, unspecified: Secondary | ICD-10-CM | POA: Diagnosis not present

## 2019-08-31 DIAGNOSIS — N2581 Secondary hyperparathyroidism of renal origin: Secondary | ICD-10-CM | POA: Diagnosis not present

## 2019-08-31 DIAGNOSIS — D509 Iron deficiency anemia, unspecified: Secondary | ICD-10-CM | POA: Diagnosis not present

## 2019-08-31 DIAGNOSIS — N186 End stage renal disease: Secondary | ICD-10-CM | POA: Diagnosis not present

## 2019-08-31 DIAGNOSIS — Z992 Dependence on renal dialysis: Secondary | ICD-10-CM | POA: Diagnosis not present

## 2019-08-31 DIAGNOSIS — E559 Vitamin D deficiency, unspecified: Secondary | ICD-10-CM | POA: Diagnosis not present

## 2019-09-01 DIAGNOSIS — F0280 Dementia in other diseases classified elsewhere without behavioral disturbance: Secondary | ICD-10-CM | POA: Diagnosis not present

## 2019-09-01 DIAGNOSIS — G301 Alzheimer's disease with late onset: Secondary | ICD-10-CM | POA: Diagnosis not present

## 2019-09-01 DIAGNOSIS — Z992 Dependence on renal dialysis: Secondary | ICD-10-CM | POA: Diagnosis not present

## 2019-09-01 DIAGNOSIS — N184 Chronic kidney disease, stage 4 (severe): Secondary | ICD-10-CM | POA: Diagnosis not present

## 2019-09-01 DIAGNOSIS — N186 End stage renal disease: Secondary | ICD-10-CM | POA: Diagnosis not present

## 2019-09-01 DIAGNOSIS — E559 Vitamin D deficiency, unspecified: Secondary | ICD-10-CM | POA: Diagnosis not present

## 2019-09-01 DIAGNOSIS — Z8673 Personal history of transient ischemic attack (TIA), and cerebral infarction without residual deficits: Secondary | ICD-10-CM | POA: Diagnosis not present

## 2019-09-01 DIAGNOSIS — N2581 Secondary hyperparathyroidism of renal origin: Secondary | ICD-10-CM | POA: Diagnosis not present

## 2019-09-01 DIAGNOSIS — D509 Iron deficiency anemia, unspecified: Secondary | ICD-10-CM | POA: Diagnosis not present

## 2019-09-02 ENCOUNTER — Inpatient Hospital Stay: Payer: 59 | Admitting: Oncology

## 2019-09-02 ENCOUNTER — Inpatient Hospital Stay: Payer: 59

## 2019-09-02 DIAGNOSIS — N2581 Secondary hyperparathyroidism of renal origin: Secondary | ICD-10-CM | POA: Diagnosis not present

## 2019-09-02 DIAGNOSIS — R0602 Shortness of breath: Secondary | ICD-10-CM | POA: Diagnosis not present

## 2019-09-02 DIAGNOSIS — Z992 Dependence on renal dialysis: Secondary | ICD-10-CM | POA: Diagnosis not present

## 2019-09-02 DIAGNOSIS — I2510 Atherosclerotic heart disease of native coronary artery without angina pectoris: Secondary | ICD-10-CM | POA: Diagnosis not present

## 2019-09-02 DIAGNOSIS — E559 Vitamin D deficiency, unspecified: Secondary | ICD-10-CM | POA: Diagnosis not present

## 2019-09-02 DIAGNOSIS — R931 Abnormal findings on diagnostic imaging of heart and coronary circulation: Secondary | ICD-10-CM | POA: Diagnosis not present

## 2019-09-02 DIAGNOSIS — N186 End stage renal disease: Secondary | ICD-10-CM | POA: Diagnosis not present

## 2019-09-02 DIAGNOSIS — D509 Iron deficiency anemia, unspecified: Secondary | ICD-10-CM | POA: Diagnosis not present

## 2019-09-03 DIAGNOSIS — E559 Vitamin D deficiency, unspecified: Secondary | ICD-10-CM | POA: Diagnosis not present

## 2019-09-03 DIAGNOSIS — R2681 Unsteadiness on feet: Secondary | ICD-10-CM | POA: Diagnosis not present

## 2019-09-03 DIAGNOSIS — Z992 Dependence on renal dialysis: Secondary | ICD-10-CM | POA: Diagnosis not present

## 2019-09-03 DIAGNOSIS — N2581 Secondary hyperparathyroidism of renal origin: Secondary | ICD-10-CM | POA: Diagnosis not present

## 2019-09-03 DIAGNOSIS — D509 Iron deficiency anemia, unspecified: Secondary | ICD-10-CM | POA: Diagnosis not present

## 2019-09-03 DIAGNOSIS — N186 End stage renal disease: Secondary | ICD-10-CM | POA: Diagnosis not present

## 2019-09-03 DIAGNOSIS — M6281 Muscle weakness (generalized): Secondary | ICD-10-CM | POA: Diagnosis not present

## 2019-09-04 DIAGNOSIS — N186 End stage renal disease: Secondary | ICD-10-CM | POA: Diagnosis not present

## 2019-09-04 DIAGNOSIS — D509 Iron deficiency anemia, unspecified: Secondary | ICD-10-CM | POA: Diagnosis not present

## 2019-09-04 DIAGNOSIS — E559 Vitamin D deficiency, unspecified: Secondary | ICD-10-CM | POA: Diagnosis not present

## 2019-09-04 DIAGNOSIS — Z992 Dependence on renal dialysis: Secondary | ICD-10-CM | POA: Diagnosis not present

## 2019-09-04 DIAGNOSIS — N2581 Secondary hyperparathyroidism of renal origin: Secondary | ICD-10-CM | POA: Diagnosis not present

## 2019-09-05 DIAGNOSIS — N2581 Secondary hyperparathyroidism of renal origin: Secondary | ICD-10-CM | POA: Diagnosis not present

## 2019-09-05 DIAGNOSIS — R2681 Unsteadiness on feet: Secondary | ICD-10-CM | POA: Diagnosis not present

## 2019-09-05 DIAGNOSIS — D509 Iron deficiency anemia, unspecified: Secondary | ICD-10-CM | POA: Diagnosis not present

## 2019-09-05 DIAGNOSIS — N186 End stage renal disease: Secondary | ICD-10-CM | POA: Diagnosis not present

## 2019-09-05 DIAGNOSIS — M6281 Muscle weakness (generalized): Secondary | ICD-10-CM | POA: Diagnosis not present

## 2019-09-05 DIAGNOSIS — E559 Vitamin D deficiency, unspecified: Secondary | ICD-10-CM | POA: Diagnosis not present

## 2019-09-05 DIAGNOSIS — Z992 Dependence on renal dialysis: Secondary | ICD-10-CM | POA: Diagnosis not present

## 2019-09-06 DIAGNOSIS — E559 Vitamin D deficiency, unspecified: Secondary | ICD-10-CM | POA: Diagnosis not present

## 2019-09-06 DIAGNOSIS — D509 Iron deficiency anemia, unspecified: Secondary | ICD-10-CM | POA: Diagnosis not present

## 2019-09-06 DIAGNOSIS — Z992 Dependence on renal dialysis: Secondary | ICD-10-CM | POA: Diagnosis not present

## 2019-09-06 DIAGNOSIS — N2581 Secondary hyperparathyroidism of renal origin: Secondary | ICD-10-CM | POA: Diagnosis not present

## 2019-09-06 DIAGNOSIS — N186 End stage renal disease: Secondary | ICD-10-CM | POA: Diagnosis not present

## 2019-09-07 DIAGNOSIS — N186 End stage renal disease: Secondary | ICD-10-CM | POA: Diagnosis not present

## 2019-09-07 DIAGNOSIS — Z992 Dependence on renal dialysis: Secondary | ICD-10-CM | POA: Diagnosis not present

## 2019-09-07 DIAGNOSIS — E559 Vitamin D deficiency, unspecified: Secondary | ICD-10-CM | POA: Diagnosis not present

## 2019-09-07 DIAGNOSIS — D509 Iron deficiency anemia, unspecified: Secondary | ICD-10-CM | POA: Diagnosis not present

## 2019-09-07 DIAGNOSIS — N2581 Secondary hyperparathyroidism of renal origin: Secondary | ICD-10-CM | POA: Diagnosis not present

## 2019-09-08 DIAGNOSIS — N186 End stage renal disease: Secondary | ICD-10-CM | POA: Diagnosis not present

## 2019-09-08 DIAGNOSIS — M6281 Muscle weakness (generalized): Secondary | ICD-10-CM | POA: Diagnosis not present

## 2019-09-08 DIAGNOSIS — R2681 Unsteadiness on feet: Secondary | ICD-10-CM | POA: Diagnosis not present

## 2019-09-08 DIAGNOSIS — N2581 Secondary hyperparathyroidism of renal origin: Secondary | ICD-10-CM | POA: Diagnosis not present

## 2019-09-08 DIAGNOSIS — D509 Iron deficiency anemia, unspecified: Secondary | ICD-10-CM | POA: Diagnosis not present

## 2019-09-08 DIAGNOSIS — Z992 Dependence on renal dialysis: Secondary | ICD-10-CM | POA: Diagnosis not present

## 2019-09-08 DIAGNOSIS — E559 Vitamin D deficiency, unspecified: Secondary | ICD-10-CM | POA: Diagnosis not present

## 2019-09-09 DIAGNOSIS — Z992 Dependence on renal dialysis: Secondary | ICD-10-CM | POA: Diagnosis not present

## 2019-09-09 DIAGNOSIS — E559 Vitamin D deficiency, unspecified: Secondary | ICD-10-CM | POA: Diagnosis not present

## 2019-09-09 DIAGNOSIS — N186 End stage renal disease: Secondary | ICD-10-CM | POA: Diagnosis not present

## 2019-09-09 DIAGNOSIS — D509 Iron deficiency anemia, unspecified: Secondary | ICD-10-CM | POA: Diagnosis not present

## 2019-09-09 DIAGNOSIS — N2581 Secondary hyperparathyroidism of renal origin: Secondary | ICD-10-CM | POA: Diagnosis not present

## 2019-09-10 DIAGNOSIS — E559 Vitamin D deficiency, unspecified: Secondary | ICD-10-CM | POA: Diagnosis not present

## 2019-09-10 DIAGNOSIS — N2581 Secondary hyperparathyroidism of renal origin: Secondary | ICD-10-CM | POA: Diagnosis not present

## 2019-09-10 DIAGNOSIS — M6281 Muscle weakness (generalized): Secondary | ICD-10-CM | POA: Diagnosis not present

## 2019-09-10 DIAGNOSIS — R2681 Unsteadiness on feet: Secondary | ICD-10-CM | POA: Diagnosis not present

## 2019-09-10 DIAGNOSIS — D509 Iron deficiency anemia, unspecified: Secondary | ICD-10-CM | POA: Diagnosis not present

## 2019-09-10 DIAGNOSIS — N186 End stage renal disease: Secondary | ICD-10-CM | POA: Diagnosis not present

## 2019-09-10 DIAGNOSIS — Z992 Dependence on renal dialysis: Secondary | ICD-10-CM | POA: Diagnosis not present

## 2019-09-11 DIAGNOSIS — I10 Essential (primary) hypertension: Secondary | ICD-10-CM | POA: Diagnosis not present

## 2019-09-11 DIAGNOSIS — I25810 Atherosclerosis of coronary artery bypass graft(s) without angina pectoris: Secondary | ICD-10-CM | POA: Diagnosis not present

## 2019-09-11 DIAGNOSIS — I2510 Atherosclerotic heart disease of native coronary artery without angina pectoris: Secondary | ICD-10-CM | POA: Diagnosis not present

## 2019-09-11 DIAGNOSIS — D509 Iron deficiency anemia, unspecified: Secondary | ICD-10-CM | POA: Diagnosis not present

## 2019-09-11 DIAGNOSIS — N186 End stage renal disease: Secondary | ICD-10-CM | POA: Diagnosis not present

## 2019-09-11 DIAGNOSIS — E559 Vitamin D deficiency, unspecified: Secondary | ICD-10-CM | POA: Diagnosis not present

## 2019-09-11 DIAGNOSIS — N2581 Secondary hyperparathyroidism of renal origin: Secondary | ICD-10-CM | POA: Diagnosis not present

## 2019-09-11 DIAGNOSIS — I340 Nonrheumatic mitral (valve) insufficiency: Secondary | ICD-10-CM | POA: Diagnosis not present

## 2019-09-11 DIAGNOSIS — R609 Edema, unspecified: Secondary | ICD-10-CM | POA: Diagnosis not present

## 2019-09-11 DIAGNOSIS — E785 Hyperlipidemia, unspecified: Secondary | ICD-10-CM | POA: Diagnosis not present

## 2019-09-11 DIAGNOSIS — Z992 Dependence on renal dialysis: Secondary | ICD-10-CM | POA: Diagnosis not present

## 2019-09-12 DIAGNOSIS — E559 Vitamin D deficiency, unspecified: Secondary | ICD-10-CM | POA: Diagnosis not present

## 2019-09-12 DIAGNOSIS — D509 Iron deficiency anemia, unspecified: Secondary | ICD-10-CM | POA: Diagnosis not present

## 2019-09-12 DIAGNOSIS — N186 End stage renal disease: Secondary | ICD-10-CM | POA: Diagnosis not present

## 2019-09-12 DIAGNOSIS — Z992 Dependence on renal dialysis: Secondary | ICD-10-CM | POA: Diagnosis not present

## 2019-09-12 DIAGNOSIS — N2581 Secondary hyperparathyroidism of renal origin: Secondary | ICD-10-CM | POA: Diagnosis not present

## 2019-09-13 DIAGNOSIS — D509 Iron deficiency anemia, unspecified: Secondary | ICD-10-CM | POA: Diagnosis not present

## 2019-09-13 DIAGNOSIS — N2581 Secondary hyperparathyroidism of renal origin: Secondary | ICD-10-CM | POA: Diagnosis not present

## 2019-09-13 DIAGNOSIS — Z992 Dependence on renal dialysis: Secondary | ICD-10-CM | POA: Diagnosis not present

## 2019-09-13 DIAGNOSIS — N186 End stage renal disease: Secondary | ICD-10-CM | POA: Diagnosis not present

## 2019-09-13 DIAGNOSIS — E559 Vitamin D deficiency, unspecified: Secondary | ICD-10-CM | POA: Diagnosis not present

## 2019-09-14 DIAGNOSIS — D509 Iron deficiency anemia, unspecified: Secondary | ICD-10-CM | POA: Diagnosis not present

## 2019-09-14 DIAGNOSIS — D631 Anemia in chronic kidney disease: Secondary | ICD-10-CM | POA: Diagnosis not present

## 2019-09-14 DIAGNOSIS — N186 End stage renal disease: Secondary | ICD-10-CM | POA: Diagnosis not present

## 2019-09-14 DIAGNOSIS — Z992 Dependence on renal dialysis: Secondary | ICD-10-CM | POA: Diagnosis not present

## 2019-09-15 DIAGNOSIS — D509 Iron deficiency anemia, unspecified: Secondary | ICD-10-CM | POA: Diagnosis not present

## 2019-09-15 DIAGNOSIS — N186 End stage renal disease: Secondary | ICD-10-CM | POA: Diagnosis not present

## 2019-09-15 DIAGNOSIS — Z992 Dependence on renal dialysis: Secondary | ICD-10-CM | POA: Diagnosis not present

## 2019-09-15 DIAGNOSIS — D631 Anemia in chronic kidney disease: Secondary | ICD-10-CM | POA: Diagnosis not present

## 2019-09-16 DIAGNOSIS — Z992 Dependence on renal dialysis: Secondary | ICD-10-CM | POA: Diagnosis not present

## 2019-09-16 DIAGNOSIS — N186 End stage renal disease: Secondary | ICD-10-CM | POA: Diagnosis not present

## 2019-09-16 DIAGNOSIS — R2681 Unsteadiness on feet: Secondary | ICD-10-CM | POA: Diagnosis not present

## 2019-09-16 DIAGNOSIS — D509 Iron deficiency anemia, unspecified: Secondary | ICD-10-CM | POA: Diagnosis not present

## 2019-09-16 DIAGNOSIS — M6281 Muscle weakness (generalized): Secondary | ICD-10-CM | POA: Diagnosis not present

## 2019-09-16 DIAGNOSIS — D631 Anemia in chronic kidney disease: Secondary | ICD-10-CM | POA: Diagnosis not present

## 2019-09-17 DIAGNOSIS — D631 Anemia in chronic kidney disease: Secondary | ICD-10-CM | POA: Diagnosis not present

## 2019-09-17 DIAGNOSIS — Z992 Dependence on renal dialysis: Secondary | ICD-10-CM | POA: Diagnosis not present

## 2019-09-17 DIAGNOSIS — D509 Iron deficiency anemia, unspecified: Secondary | ICD-10-CM | POA: Diagnosis not present

## 2019-09-17 DIAGNOSIS — N186 End stage renal disease: Secondary | ICD-10-CM | POA: Diagnosis not present

## 2019-09-18 DIAGNOSIS — N186 End stage renal disease: Secondary | ICD-10-CM | POA: Diagnosis not present

## 2019-09-18 DIAGNOSIS — Z992 Dependence on renal dialysis: Secondary | ICD-10-CM | POA: Diagnosis not present

## 2019-09-18 DIAGNOSIS — D509 Iron deficiency anemia, unspecified: Secondary | ICD-10-CM | POA: Diagnosis not present

## 2019-09-18 DIAGNOSIS — D631 Anemia in chronic kidney disease: Secondary | ICD-10-CM | POA: Diagnosis not present

## 2019-09-19 DIAGNOSIS — D509 Iron deficiency anemia, unspecified: Secondary | ICD-10-CM | POA: Diagnosis not present

## 2019-09-19 DIAGNOSIS — M6281 Muscle weakness (generalized): Secondary | ICD-10-CM | POA: Diagnosis not present

## 2019-09-19 DIAGNOSIS — Z992 Dependence on renal dialysis: Secondary | ICD-10-CM | POA: Diagnosis not present

## 2019-09-19 DIAGNOSIS — R2681 Unsteadiness on feet: Secondary | ICD-10-CM | POA: Diagnosis not present

## 2019-09-19 DIAGNOSIS — D631 Anemia in chronic kidney disease: Secondary | ICD-10-CM | POA: Diagnosis not present

## 2019-09-19 DIAGNOSIS — N186 End stage renal disease: Secondary | ICD-10-CM | POA: Diagnosis not present

## 2019-09-20 DIAGNOSIS — N186 End stage renal disease: Secondary | ICD-10-CM | POA: Diagnosis not present

## 2019-09-20 DIAGNOSIS — D631 Anemia in chronic kidney disease: Secondary | ICD-10-CM | POA: Diagnosis not present

## 2019-09-20 DIAGNOSIS — D509 Iron deficiency anemia, unspecified: Secondary | ICD-10-CM | POA: Diagnosis not present

## 2019-09-20 DIAGNOSIS — Z992 Dependence on renal dialysis: Secondary | ICD-10-CM | POA: Diagnosis not present

## 2019-09-21 DIAGNOSIS — N186 End stage renal disease: Secondary | ICD-10-CM | POA: Diagnosis not present

## 2019-09-21 DIAGNOSIS — D509 Iron deficiency anemia, unspecified: Secondary | ICD-10-CM | POA: Diagnosis not present

## 2019-09-21 DIAGNOSIS — Z992 Dependence on renal dialysis: Secondary | ICD-10-CM | POA: Diagnosis not present

## 2019-09-21 DIAGNOSIS — D631 Anemia in chronic kidney disease: Secondary | ICD-10-CM | POA: Diagnosis not present

## 2019-09-22 DIAGNOSIS — Z992 Dependence on renal dialysis: Secondary | ICD-10-CM | POA: Diagnosis not present

## 2019-09-22 DIAGNOSIS — N186 End stage renal disease: Secondary | ICD-10-CM | POA: Diagnosis not present

## 2019-09-22 DIAGNOSIS — D631 Anemia in chronic kidney disease: Secondary | ICD-10-CM | POA: Diagnosis not present

## 2019-09-22 DIAGNOSIS — D509 Iron deficiency anemia, unspecified: Secondary | ICD-10-CM | POA: Diagnosis not present

## 2019-09-23 DIAGNOSIS — N186 End stage renal disease: Secondary | ICD-10-CM | POA: Diagnosis not present

## 2019-09-23 DIAGNOSIS — Z992 Dependence on renal dialysis: Secondary | ICD-10-CM | POA: Diagnosis not present

## 2019-09-23 DIAGNOSIS — D509 Iron deficiency anemia, unspecified: Secondary | ICD-10-CM | POA: Diagnosis not present

## 2019-09-23 DIAGNOSIS — D631 Anemia in chronic kidney disease: Secondary | ICD-10-CM | POA: Diagnosis not present

## 2019-09-24 DIAGNOSIS — D509 Iron deficiency anemia, unspecified: Secondary | ICD-10-CM | POA: Diagnosis not present

## 2019-09-24 DIAGNOSIS — D631 Anemia in chronic kidney disease: Secondary | ICD-10-CM | POA: Diagnosis not present

## 2019-09-24 DIAGNOSIS — N186 End stage renal disease: Secondary | ICD-10-CM | POA: Diagnosis not present

## 2019-09-24 DIAGNOSIS — Z992 Dependence on renal dialysis: Secondary | ICD-10-CM | POA: Diagnosis not present

## 2019-09-25 DIAGNOSIS — Z992 Dependence on renal dialysis: Secondary | ICD-10-CM | POA: Diagnosis not present

## 2019-09-25 DIAGNOSIS — D631 Anemia in chronic kidney disease: Secondary | ICD-10-CM | POA: Diagnosis not present

## 2019-09-25 DIAGNOSIS — D509 Iron deficiency anemia, unspecified: Secondary | ICD-10-CM | POA: Diagnosis not present

## 2019-09-25 DIAGNOSIS — N186 End stage renal disease: Secondary | ICD-10-CM | POA: Diagnosis not present

## 2019-09-26 DIAGNOSIS — D631 Anemia in chronic kidney disease: Secondary | ICD-10-CM | POA: Diagnosis not present

## 2019-09-26 DIAGNOSIS — D509 Iron deficiency anemia, unspecified: Secondary | ICD-10-CM | POA: Diagnosis not present

## 2019-09-26 DIAGNOSIS — N186 End stage renal disease: Secondary | ICD-10-CM | POA: Diagnosis not present

## 2019-09-26 DIAGNOSIS — Z992 Dependence on renal dialysis: Secondary | ICD-10-CM | POA: Diagnosis not present

## 2019-09-27 DIAGNOSIS — N186 End stage renal disease: Secondary | ICD-10-CM | POA: Diagnosis not present

## 2019-09-27 DIAGNOSIS — D631 Anemia in chronic kidney disease: Secondary | ICD-10-CM | POA: Diagnosis not present

## 2019-09-27 DIAGNOSIS — D509 Iron deficiency anemia, unspecified: Secondary | ICD-10-CM | POA: Diagnosis not present

## 2019-09-27 DIAGNOSIS — Z992 Dependence on renal dialysis: Secondary | ICD-10-CM | POA: Diagnosis not present

## 2019-09-28 DIAGNOSIS — Z992 Dependence on renal dialysis: Secondary | ICD-10-CM | POA: Diagnosis not present

## 2019-09-28 DIAGNOSIS — D509 Iron deficiency anemia, unspecified: Secondary | ICD-10-CM | POA: Diagnosis not present

## 2019-09-28 DIAGNOSIS — N186 End stage renal disease: Secondary | ICD-10-CM | POA: Diagnosis not present

## 2019-09-28 DIAGNOSIS — D631 Anemia in chronic kidney disease: Secondary | ICD-10-CM | POA: Diagnosis not present

## 2019-09-29 DIAGNOSIS — N186 End stage renal disease: Secondary | ICD-10-CM | POA: Diagnosis not present

## 2019-09-29 DIAGNOSIS — D631 Anemia in chronic kidney disease: Secondary | ICD-10-CM | POA: Diagnosis not present

## 2019-09-29 DIAGNOSIS — Z992 Dependence on renal dialysis: Secondary | ICD-10-CM | POA: Diagnosis not present

## 2019-09-29 DIAGNOSIS — D509 Iron deficiency anemia, unspecified: Secondary | ICD-10-CM | POA: Diagnosis not present

## 2019-09-30 DIAGNOSIS — Z992 Dependence on renal dialysis: Secondary | ICD-10-CM | POA: Diagnosis not present

## 2019-09-30 DIAGNOSIS — N186 End stage renal disease: Secondary | ICD-10-CM | POA: Diagnosis not present

## 2019-09-30 DIAGNOSIS — D509 Iron deficiency anemia, unspecified: Secondary | ICD-10-CM | POA: Diagnosis not present

## 2019-09-30 DIAGNOSIS — D631 Anemia in chronic kidney disease: Secondary | ICD-10-CM | POA: Diagnosis not present

## 2019-10-01 DIAGNOSIS — Z992 Dependence on renal dialysis: Secondary | ICD-10-CM | POA: Diagnosis not present

## 2019-10-01 DIAGNOSIS — D509 Iron deficiency anemia, unspecified: Secondary | ICD-10-CM | POA: Diagnosis not present

## 2019-10-01 DIAGNOSIS — D631 Anemia in chronic kidney disease: Secondary | ICD-10-CM | POA: Diagnosis not present

## 2019-10-01 DIAGNOSIS — N186 End stage renal disease: Secondary | ICD-10-CM | POA: Diagnosis not present

## 2019-10-02 DIAGNOSIS — D631 Anemia in chronic kidney disease: Secondary | ICD-10-CM | POA: Diagnosis not present

## 2019-10-02 DIAGNOSIS — Z992 Dependence on renal dialysis: Secondary | ICD-10-CM | POA: Diagnosis not present

## 2019-10-02 DIAGNOSIS — N186 End stage renal disease: Secondary | ICD-10-CM | POA: Diagnosis not present

## 2019-10-02 DIAGNOSIS — D509 Iron deficiency anemia, unspecified: Secondary | ICD-10-CM | POA: Diagnosis not present

## 2019-10-03 DIAGNOSIS — D631 Anemia in chronic kidney disease: Secondary | ICD-10-CM | POA: Diagnosis not present

## 2019-10-03 DIAGNOSIS — N186 End stage renal disease: Secondary | ICD-10-CM | POA: Diagnosis not present

## 2019-10-03 DIAGNOSIS — Z992 Dependence on renal dialysis: Secondary | ICD-10-CM | POA: Diagnosis not present

## 2019-10-03 DIAGNOSIS — D509 Iron deficiency anemia, unspecified: Secondary | ICD-10-CM | POA: Diagnosis not present

## 2019-10-04 DIAGNOSIS — D509 Iron deficiency anemia, unspecified: Secondary | ICD-10-CM | POA: Diagnosis not present

## 2019-10-04 DIAGNOSIS — Z992 Dependence on renal dialysis: Secondary | ICD-10-CM | POA: Diagnosis not present

## 2019-10-04 DIAGNOSIS — D631 Anemia in chronic kidney disease: Secondary | ICD-10-CM | POA: Diagnosis not present

## 2019-10-04 DIAGNOSIS — N186 End stage renal disease: Secondary | ICD-10-CM | POA: Diagnosis not present

## 2019-10-05 DIAGNOSIS — Z992 Dependence on renal dialysis: Secondary | ICD-10-CM | POA: Diagnosis not present

## 2019-10-05 DIAGNOSIS — N186 End stage renal disease: Secondary | ICD-10-CM | POA: Diagnosis not present

## 2019-10-05 DIAGNOSIS — D509 Iron deficiency anemia, unspecified: Secondary | ICD-10-CM | POA: Diagnosis not present

## 2019-10-05 DIAGNOSIS — D631 Anemia in chronic kidney disease: Secondary | ICD-10-CM | POA: Diagnosis not present

## 2019-10-06 DIAGNOSIS — D509 Iron deficiency anemia, unspecified: Secondary | ICD-10-CM | POA: Diagnosis not present

## 2019-10-06 DIAGNOSIS — Z992 Dependence on renal dialysis: Secondary | ICD-10-CM | POA: Diagnosis not present

## 2019-10-06 DIAGNOSIS — N186 End stage renal disease: Secondary | ICD-10-CM | POA: Diagnosis not present

## 2019-10-06 DIAGNOSIS — D631 Anemia in chronic kidney disease: Secondary | ICD-10-CM | POA: Diagnosis not present

## 2019-10-07 DIAGNOSIS — D509 Iron deficiency anemia, unspecified: Secondary | ICD-10-CM | POA: Diagnosis not present

## 2019-10-07 DIAGNOSIS — N186 End stage renal disease: Secondary | ICD-10-CM | POA: Diagnosis not present

## 2019-10-07 DIAGNOSIS — D631 Anemia in chronic kidney disease: Secondary | ICD-10-CM | POA: Diagnosis not present

## 2019-10-07 DIAGNOSIS — Z992 Dependence on renal dialysis: Secondary | ICD-10-CM | POA: Diagnosis not present

## 2019-10-08 ENCOUNTER — Telehealth: Payer: Self-pay | Admitting: Family Medicine

## 2019-10-08 DIAGNOSIS — D509 Iron deficiency anemia, unspecified: Secondary | ICD-10-CM | POA: Diagnosis not present

## 2019-10-08 DIAGNOSIS — N186 End stage renal disease: Secondary | ICD-10-CM | POA: Diagnosis not present

## 2019-10-08 DIAGNOSIS — Z992 Dependence on renal dialysis: Secondary | ICD-10-CM | POA: Diagnosis not present

## 2019-10-08 DIAGNOSIS — D631 Anemia in chronic kidney disease: Secondary | ICD-10-CM | POA: Diagnosis not present

## 2019-10-08 DIAGNOSIS — E1122 Type 2 diabetes mellitus with diabetic chronic kidney disease: Secondary | ICD-10-CM

## 2019-10-08 NOTE — Telephone Encounter (Signed)
Pharmacy wants $230 for a 30 day supply of Trajenta/ Pt states that a high cost for a 30 day supply and wanted to see about options with lower to no cost/ please advise asap

## 2019-10-09 DIAGNOSIS — D631 Anemia in chronic kidney disease: Secondary | ICD-10-CM | POA: Diagnosis not present

## 2019-10-09 DIAGNOSIS — N186 End stage renal disease: Secondary | ICD-10-CM | POA: Diagnosis not present

## 2019-10-09 DIAGNOSIS — Z992 Dependence on renal dialysis: Secondary | ICD-10-CM | POA: Diagnosis not present

## 2019-10-09 DIAGNOSIS — D509 Iron deficiency anemia, unspecified: Secondary | ICD-10-CM | POA: Diagnosis not present

## 2019-10-10 DIAGNOSIS — N186 End stage renal disease: Secondary | ICD-10-CM | POA: Diagnosis not present

## 2019-10-10 DIAGNOSIS — D509 Iron deficiency anemia, unspecified: Secondary | ICD-10-CM | POA: Diagnosis not present

## 2019-10-10 DIAGNOSIS — D631 Anemia in chronic kidney disease: Secondary | ICD-10-CM | POA: Diagnosis not present

## 2019-10-10 DIAGNOSIS — Z992 Dependence on renal dialysis: Secondary | ICD-10-CM | POA: Diagnosis not present

## 2019-10-11 DIAGNOSIS — Z992 Dependence on renal dialysis: Secondary | ICD-10-CM | POA: Diagnosis not present

## 2019-10-11 DIAGNOSIS — D509 Iron deficiency anemia, unspecified: Secondary | ICD-10-CM | POA: Diagnosis not present

## 2019-10-11 DIAGNOSIS — N186 End stage renal disease: Secondary | ICD-10-CM | POA: Diagnosis not present

## 2019-10-11 DIAGNOSIS — D631 Anemia in chronic kidney disease: Secondary | ICD-10-CM | POA: Diagnosis not present

## 2019-10-12 DIAGNOSIS — N186 End stage renal disease: Secondary | ICD-10-CM | POA: Diagnosis not present

## 2019-10-12 DIAGNOSIS — D509 Iron deficiency anemia, unspecified: Secondary | ICD-10-CM | POA: Diagnosis not present

## 2019-10-12 DIAGNOSIS — Z992 Dependence on renal dialysis: Secondary | ICD-10-CM | POA: Diagnosis not present

## 2019-10-12 DIAGNOSIS — D631 Anemia in chronic kidney disease: Secondary | ICD-10-CM | POA: Diagnosis not present

## 2019-10-13 DIAGNOSIS — Z992 Dependence on renal dialysis: Secondary | ICD-10-CM | POA: Diagnosis not present

## 2019-10-13 DIAGNOSIS — D509 Iron deficiency anemia, unspecified: Secondary | ICD-10-CM | POA: Diagnosis not present

## 2019-10-13 DIAGNOSIS — D631 Anemia in chronic kidney disease: Secondary | ICD-10-CM | POA: Diagnosis not present

## 2019-10-13 DIAGNOSIS — N186 End stage renal disease: Secondary | ICD-10-CM | POA: Diagnosis not present

## 2019-10-14 ENCOUNTER — Telehealth: Payer: Self-pay

## 2019-10-14 DIAGNOSIS — Z992 Dependence on renal dialysis: Secondary | ICD-10-CM | POA: Diagnosis not present

## 2019-10-14 DIAGNOSIS — D631 Anemia in chronic kidney disease: Secondary | ICD-10-CM | POA: Diagnosis not present

## 2019-10-14 DIAGNOSIS — D509 Iron deficiency anemia, unspecified: Secondary | ICD-10-CM | POA: Diagnosis not present

## 2019-10-14 DIAGNOSIS — N186 End stage renal disease: Secondary | ICD-10-CM | POA: Diagnosis not present

## 2019-10-14 NOTE — Telephone Encounter (Signed)
Patient notified

## 2019-10-14 NOTE — Chronic Care Management (AMB) (Signed)
  Chronic Care Management   Outreach Note  10/14/2019 Name: Ray Lamb MRN: [id number] DOB: 02-06-1931  Ray Lamb is a 86 y.o. year old male who is a primary care patient of Valerie Roys, DO. I reached out to [employer] by phone today in response to a referral sent by Ray Lamb's PCP, Dr. Wynetta Emery      An unsuccessful telephone outreach was attempted today. The patient was referred to the case management team for assistance with care management and care coordination.   Follow Up Plan: A HIPPA compliant phone message was left for the patient providing contact information and requesting a return call.  The care management team will reach out to the patient again over the next 7 days.  If patient returns call to provider office, please advise to call [facility] [place], [place], [place], [place] [address] Direct Dial: [phone] [email or web address]

## 2019-10-14 NOTE — Telephone Encounter (Signed)
Will refer to CCM pharmacy.

## 2019-10-15 ENCOUNTER — Ambulatory Visit: Payer: Medicare Other | Admitting: Licensed Clinical Social Worker

## 2019-10-15 DIAGNOSIS — N184 Chronic kidney disease, stage 4 (severe): Secondary | ICD-10-CM

## 2019-10-15 DIAGNOSIS — E1122 Type 2 diabetes mellitus with diabetic chronic kidney disease: Secondary | ICD-10-CM

## 2019-10-15 DIAGNOSIS — N185 Chronic kidney disease, stage 5: Secondary | ICD-10-CM

## 2019-10-15 DIAGNOSIS — I10 Essential (primary) hypertension: Secondary | ICD-10-CM

## 2019-10-15 DIAGNOSIS — I129 Hypertensive chronic kidney disease with stage 1 through stage 4 chronic kidney disease, or unspecified chronic kidney disease: Secondary | ICD-10-CM

## 2019-10-15 DIAGNOSIS — Z992 Dependence on renal dialysis: Secondary | ICD-10-CM | POA: Diagnosis not present

## 2019-10-15 DIAGNOSIS — D509 Iron deficiency anemia, unspecified: Secondary | ICD-10-CM | POA: Diagnosis not present

## 2019-10-15 DIAGNOSIS — D631 Anemia in chronic kidney disease: Secondary | ICD-10-CM | POA: Diagnosis not present

## 2019-10-15 DIAGNOSIS — N186 End stage renal disease: Secondary | ICD-10-CM | POA: Diagnosis not present

## 2019-10-15 NOTE — Chronic Care Management (AMB) (Signed)
Chronic Care Management    Clinical Social Work Follow Up Note  10/15/2019 Name: Ray Lamb MRN: [id number] DOB: February 09, 1931  Ray Lamb is a 86 y.o. year old male who is a primary care patient of Valerie Roys, DO. The CCM team was consulted for assistance with [employer] .   Review of patient status, including review of consultants reports, other relevant assessments, and collaboration with appropriate care team members and the patient's provider was performed as part of comprehensive patient evaluation and provision of chronic care management services.    SDOH (Social Determinants of Health) assessments performed: Yes    Outpatient Encounter Medications as of 10/15/2019  Medication Sig Note  . ASPIRIN 81 PO Take by mouth daily.   Marland Kitchen atorvastatin (LIPITOR) 80 MG tablet Take 1 tablet (80 mg total) by mouth at bedtime.   . carvedilol (COREG) 12.5 MG tablet Take by mouth 2 (two) times daily.    . clopidogrel (PLAVIX) 75 MG tablet Take 1 tablet (75 mg total) by mouth daily.   Marland Kitchen ezetimibe (ZETIA) 10 MG tablet Take 1 tablet (10 mg total) by mouth daily.   . furosemide (LASIX) 40 MG tablet Take 80 mg by mouth 2 (two) times daily.  03/07/2019: TAKES PRN  . isosorbide mononitrate (IMDUR) 30 MG 24 hr tablet Take 1.5 tablets (45 mg total) by mouth daily.   . Lancets (ONETOUCH ULTRASOFT) lancets USE TO CHECK BLOOD SUGAR TWICE A DAY   . Loratadine (CLARITIN PO) Take 1 tablet by mouth daily as needed.    . memantine (NAMENDA) 5 MG tablet Take 1 tablet by mouth 2 (two) times daily.   . mirtazapine (REMERON) 7.5 MG tablet Take 7.5 mg by mouth as needed.    . Multiple Vitamins-Minerals (CENTRUM SILVER ULTRA MENS PO) Take by mouth daily.   Marland Kitchen nystatin (MYCOSTATIN/NYSTOP) powder Apply 1 application topically 3 (three) times daily.   . ondansetron (ZOFRAN) 4 MG tablet Take 4 mg by mouth every 8 (eight) hours as needed.   . sitaGLIPtin (JANUVIA) 25 MG tablet Take 1 tablet (25 mg total) by mouth  daily.   . timolol (TIMOPTIC) 0.5 % ophthalmic solution Place 1 drop into both eyes daily.    . traMADol (ULTRAM) 50 MG tablet Take 1 tablet (50 mg total) by mouth every 6 (six) hours as needed. (Patient not taking: Reported on 07/16/2019)   . TURMERIC PO Take 1,300 mg by mouth daily.     Facility-Administered Encounter Medications as of 10/15/2019  Medication  . sodium chloride flush (NS) 0.9 % injection 3 mL     Goals Addressed    .  SW- "I will benefit from follow up calls, education and support." (pt-stated)        CARE PLAN ENTRY (see longitudinal plan of care for additional care plan information)  Current Barriers:  . Level of care concerns . ADL IADL limitations . Social Isolation . Inability to perform ADL's independently . Inability to perform IADL's independently  Clinical Social Work Clinical Goal(s):   Marland Kitchen Over the next 120 days, patient/caregiver will work with SW to address concerns related to care coordination needs and lack of education/support/resource connection. LCSW will assist patient in gaining community resource education and additional support and resource connection as well in order to maintain health and mental health appropriately  . Over the next 120 days, patient will demonstrate improved adherence to self care as evidenced by implementing healthy self-care into his daily routine such as: attending  all medical appointments, deep breathing exercises, taking time for self-reflection, taking medications as prescribed, drinking water and daily exercise to improve mobility and mood.  . Over the next 120 days, patient will demonstrate improved health management independence as evidenced by implementing healthy self-care skills and positive support/resources into his daily routine to help cope with stressors and improve overall health and well-being  . Over the next 120 days, patient or caregiver will verbalize basic understanding of depression/stress process and self  health management plan as evidenced by her participation in development of long term plan of care and institution of self health management strategies  Interventions: . Inter-disciplinary care team collaboration (see longitudinal plan of care) . Patient interviewed and appropriate assessments performed . [place], son and daughter in law.  . Patient reports that his spouse is his primary caregiver. Patient reports at home dialysis treatment. Patient was recently set up with a nutritionist through his dialysis physician. He reports that this has helped to improve his eating habits, mobility and strength.  . Provided mental health counseling with regard to acceptance. Patient is having a difficulty time adjusting to his new way of life and that he is unable to do what he once could due to health reasons. Extensive emotional support provided to patient. Patient confirms having a very strong support network that he can lean on whenever he is struggling with this specific concern.  . Provided patient with information about available in home support resources, day programs and senior resources/senior centers. Patient denies needing these resources at this time. Patient denies needing HH referral at this time. Patient and spouse will hire in home aide to assist with house work and light cleaning if needed. Patient's son and grandson live locally and are able to provide assistance when needed.  . Discussed plans with patient for ongoing care management follow up and provided patient with direct contact information for care management team . Advised patient to contact CCM program or CFP front desk for any case management needs that arise.  . Assisted patient/caregiver with obtaining information about health plan benefits . Provided education and assistance to client regarding Advanced Directives. . Provided education to patient/caregiver regarding level of care  options. . Provided education to patient/caregiver about Hospice and/or Palliative Care services  Patient Self Care Activities:  . Patient verbalizes understanding of plan to contact CFP or CCM team with any case management needs or concerns  . Attends all scheduled provider appointments . Calls provider office for new concerns or questions . Lacks social connections . Unable to perform ADLs independently . Unable to perform IADLs independently  Initial goal documentation      Follow Up Plan: SW will follow up with patient by phone over the next quarter  Eula Fried, BSW, MSW, [email or web address] Phone: [phone]

## 2019-10-16 DIAGNOSIS — Z992 Dependence on renal dialysis: Secondary | ICD-10-CM | POA: Diagnosis not present

## 2019-10-16 DIAGNOSIS — D631 Anemia in chronic kidney disease: Secondary | ICD-10-CM | POA: Diagnosis not present

## 2019-10-16 DIAGNOSIS — N186 End stage renal disease: Secondary | ICD-10-CM | POA: Diagnosis not present

## 2019-10-16 DIAGNOSIS — D509 Iron deficiency anemia, unspecified: Secondary | ICD-10-CM | POA: Diagnosis not present

## 2019-10-17 DIAGNOSIS — D509 Iron deficiency anemia, unspecified: Secondary | ICD-10-CM | POA: Diagnosis not present

## 2019-10-17 DIAGNOSIS — Z992 Dependence on renal dialysis: Secondary | ICD-10-CM | POA: Diagnosis not present

## 2019-10-17 DIAGNOSIS — D631 Anemia in chronic kidney disease: Secondary | ICD-10-CM | POA: Diagnosis not present

## 2019-10-17 DIAGNOSIS — N186 End stage renal disease: Secondary | ICD-10-CM | POA: Diagnosis not present

## 2019-10-18 DIAGNOSIS — N186 End stage renal disease: Secondary | ICD-10-CM | POA: Diagnosis not present

## 2019-10-18 DIAGNOSIS — Z992 Dependence on renal dialysis: Secondary | ICD-10-CM | POA: Diagnosis not present

## 2019-10-18 DIAGNOSIS — D509 Iron deficiency anemia, unspecified: Secondary | ICD-10-CM | POA: Diagnosis not present

## 2019-10-18 DIAGNOSIS — D631 Anemia in chronic kidney disease: Secondary | ICD-10-CM | POA: Diagnosis not present

## 2019-10-19 DIAGNOSIS — N186 End stage renal disease: Secondary | ICD-10-CM | POA: Diagnosis not present

## 2019-10-19 DIAGNOSIS — D631 Anemia in chronic kidney disease: Secondary | ICD-10-CM | POA: Diagnosis not present

## 2019-10-19 DIAGNOSIS — D509 Iron deficiency anemia, unspecified: Secondary | ICD-10-CM | POA: Diagnosis not present

## 2019-10-19 DIAGNOSIS — Z992 Dependence on renal dialysis: Secondary | ICD-10-CM | POA: Diagnosis not present

## 2019-10-20 DIAGNOSIS — Z992 Dependence on renal dialysis: Secondary | ICD-10-CM | POA: Diagnosis not present

## 2019-10-20 DIAGNOSIS — D631 Anemia in chronic kidney disease: Secondary | ICD-10-CM | POA: Diagnosis not present

## 2019-10-20 DIAGNOSIS — N186 End stage renal disease: Secondary | ICD-10-CM | POA: Diagnosis not present

## 2019-10-20 DIAGNOSIS — D509 Iron deficiency anemia, unspecified: Secondary | ICD-10-CM | POA: Diagnosis not present

## 2019-10-21 DIAGNOSIS — Z992 Dependence on renal dialysis: Secondary | ICD-10-CM | POA: Diagnosis not present

## 2019-10-21 DIAGNOSIS — D509 Iron deficiency anemia, unspecified: Secondary | ICD-10-CM | POA: Diagnosis not present

## 2019-10-21 DIAGNOSIS — N186 End stage renal disease: Secondary | ICD-10-CM | POA: Diagnosis not present

## 2019-10-21 DIAGNOSIS — D631 Anemia in chronic kidney disease: Secondary | ICD-10-CM | POA: Diagnosis not present

## 2019-10-21 NOTE — Chronic Care Management (AMB) (Signed)
  Chronic Care Management   Note  10/21/2019 Name: Ray Lamb MRN: [id number] DOB: 02-10-1931  Ray Lamb is a 86 y.o. year old male who is a primary care patient of Valerie Roys, DO. Ray Lamb is currently enrolled in care management services. An additional referral for Pharmacy  was placed.   Follow up plan: Telephone appointment with care management team member scheduled for:11/19/2019  Noreene Larsson, [place], [place],  [address] Direct Dial: [phone] [email or web address]: [email or web address]

## 2019-10-22 DIAGNOSIS — N186 End stage renal disease: Secondary | ICD-10-CM | POA: Diagnosis not present

## 2019-10-22 DIAGNOSIS — D509 Iron deficiency anemia, unspecified: Secondary | ICD-10-CM | POA: Diagnosis not present

## 2019-10-22 DIAGNOSIS — Z992 Dependence on renal dialysis: Secondary | ICD-10-CM | POA: Diagnosis not present

## 2019-10-22 DIAGNOSIS — D631 Anemia in chronic kidney disease: Secondary | ICD-10-CM | POA: Diagnosis not present

## 2019-10-23 DIAGNOSIS — D631 Anemia in chronic kidney disease: Secondary | ICD-10-CM | POA: Diagnosis not present

## 2019-10-23 DIAGNOSIS — D509 Iron deficiency anemia, unspecified: Secondary | ICD-10-CM | POA: Diagnosis not present

## 2019-10-23 DIAGNOSIS — Z992 Dependence on renal dialysis: Secondary | ICD-10-CM | POA: Diagnosis not present

## 2019-10-23 DIAGNOSIS — N186 End stage renal disease: Secondary | ICD-10-CM | POA: Diagnosis not present

## 2019-10-24 DIAGNOSIS — N186 End stage renal disease: Secondary | ICD-10-CM | POA: Diagnosis not present

## 2019-10-24 DIAGNOSIS — D509 Iron deficiency anemia, unspecified: Secondary | ICD-10-CM | POA: Diagnosis not present

## 2019-10-24 DIAGNOSIS — D631 Anemia in chronic kidney disease: Secondary | ICD-10-CM | POA: Diagnosis not present

## 2019-10-24 DIAGNOSIS — Z992 Dependence on renal dialysis: Secondary | ICD-10-CM | POA: Diagnosis not present

## 2019-10-25 DIAGNOSIS — D631 Anemia in chronic kidney disease: Secondary | ICD-10-CM | POA: Diagnosis not present

## 2019-10-25 DIAGNOSIS — D509 Iron deficiency anemia, unspecified: Secondary | ICD-10-CM | POA: Diagnosis not present

## 2019-10-25 DIAGNOSIS — N186 End stage renal disease: Secondary | ICD-10-CM | POA: Diagnosis not present

## 2019-10-25 DIAGNOSIS — Z992 Dependence on renal dialysis: Secondary | ICD-10-CM | POA: Diagnosis not present

## 2019-10-26 DIAGNOSIS — D509 Iron deficiency anemia, unspecified: Secondary | ICD-10-CM | POA: Diagnosis not present

## 2019-10-26 DIAGNOSIS — N186 End stage renal disease: Secondary | ICD-10-CM | POA: Diagnosis not present

## 2019-10-26 DIAGNOSIS — Z992 Dependence on renal dialysis: Secondary | ICD-10-CM | POA: Diagnosis not present

## 2019-10-26 DIAGNOSIS — D631 Anemia in chronic kidney disease: Secondary | ICD-10-CM | POA: Diagnosis not present

## 2019-10-27 DIAGNOSIS — N186 End stage renal disease: Secondary | ICD-10-CM | POA: Diagnosis not present

## 2019-10-27 DIAGNOSIS — Z992 Dependence on renal dialysis: Secondary | ICD-10-CM | POA: Diagnosis not present

## 2019-10-27 DIAGNOSIS — D631 Anemia in chronic kidney disease: Secondary | ICD-10-CM | POA: Diagnosis not present

## 2019-10-27 DIAGNOSIS — D509 Iron deficiency anemia, unspecified: Secondary | ICD-10-CM | POA: Diagnosis not present

## 2019-10-28 DIAGNOSIS — Z992 Dependence on renal dialysis: Secondary | ICD-10-CM | POA: Diagnosis not present

## 2019-10-28 DIAGNOSIS — D631 Anemia in chronic kidney disease: Secondary | ICD-10-CM | POA: Diagnosis not present

## 2019-10-28 DIAGNOSIS — N186 End stage renal disease: Secondary | ICD-10-CM | POA: Diagnosis not present

## 2019-10-28 DIAGNOSIS — D509 Iron deficiency anemia, unspecified: Secondary | ICD-10-CM | POA: Diagnosis not present

## 2019-10-29 DIAGNOSIS — D631 Anemia in chronic kidney disease: Secondary | ICD-10-CM | POA: Diagnosis not present

## 2019-10-29 DIAGNOSIS — D509 Iron deficiency anemia, unspecified: Secondary | ICD-10-CM | POA: Diagnosis not present

## 2019-10-29 DIAGNOSIS — Z992 Dependence on renal dialysis: Secondary | ICD-10-CM | POA: Diagnosis not present

## 2019-10-29 DIAGNOSIS — N186 End stage renal disease: Secondary | ICD-10-CM | POA: Diagnosis not present

## 2019-10-30 DIAGNOSIS — D631 Anemia in chronic kidney disease: Secondary | ICD-10-CM | POA: Diagnosis not present

## 2019-10-30 DIAGNOSIS — D509 Iron deficiency anemia, unspecified: Secondary | ICD-10-CM | POA: Diagnosis not present

## 2019-10-30 DIAGNOSIS — Z992 Dependence on renal dialysis: Secondary | ICD-10-CM | POA: Diagnosis not present

## 2019-10-30 DIAGNOSIS — N186 End stage renal disease: Secondary | ICD-10-CM | POA: Diagnosis not present

## 2019-10-31 ENCOUNTER — Other Ambulatory Visit: Payer: Self-pay | Admitting: Family Medicine

## 2019-10-31 DIAGNOSIS — Z992 Dependence on renal dialysis: Secondary | ICD-10-CM | POA: Diagnosis not present

## 2019-10-31 DIAGNOSIS — D509 Iron deficiency anemia, unspecified: Secondary | ICD-10-CM | POA: Diagnosis not present

## 2019-10-31 DIAGNOSIS — E7800 Pure hypercholesterolemia, unspecified: Secondary | ICD-10-CM

## 2019-10-31 DIAGNOSIS — D631 Anemia in chronic kidney disease: Secondary | ICD-10-CM | POA: Diagnosis not present

## 2019-10-31 DIAGNOSIS — N186 End stage renal disease: Secondary | ICD-10-CM | POA: Diagnosis not present

## 2019-11-01 DIAGNOSIS — Z992 Dependence on renal dialysis: Secondary | ICD-10-CM | POA: Diagnosis not present

## 2019-11-01 DIAGNOSIS — D509 Iron deficiency anemia, unspecified: Secondary | ICD-10-CM | POA: Diagnosis not present

## 2019-11-01 DIAGNOSIS — D631 Anemia in chronic kidney disease: Secondary | ICD-10-CM | POA: Diagnosis not present

## 2019-11-01 DIAGNOSIS — N186 End stage renal disease: Secondary | ICD-10-CM | POA: Diagnosis not present

## 2019-11-02 DIAGNOSIS — Z992 Dependence on renal dialysis: Secondary | ICD-10-CM | POA: Diagnosis not present

## 2019-11-02 DIAGNOSIS — D509 Iron deficiency anemia, unspecified: Secondary | ICD-10-CM | POA: Diagnosis not present

## 2019-11-02 DIAGNOSIS — N186 End stage renal disease: Secondary | ICD-10-CM | POA: Diagnosis not present

## 2019-11-02 DIAGNOSIS — D631 Anemia in chronic kidney disease: Secondary | ICD-10-CM | POA: Diagnosis not present

## 2019-11-03 DIAGNOSIS — D509 Iron deficiency anemia, unspecified: Secondary | ICD-10-CM | POA: Diagnosis not present

## 2019-11-03 DIAGNOSIS — Z992 Dependence on renal dialysis: Secondary | ICD-10-CM | POA: Diagnosis not present

## 2019-11-03 DIAGNOSIS — N186 End stage renal disease: Secondary | ICD-10-CM | POA: Diagnosis not present

## 2019-11-03 DIAGNOSIS — D631 Anemia in chronic kidney disease: Secondary | ICD-10-CM | POA: Diagnosis not present

## 2019-11-04 DIAGNOSIS — Z992 Dependence on renal dialysis: Secondary | ICD-10-CM | POA: Diagnosis not present

## 2019-11-04 DIAGNOSIS — D509 Iron deficiency anemia, unspecified: Secondary | ICD-10-CM | POA: Diagnosis not present

## 2019-11-04 DIAGNOSIS — N186 End stage renal disease: Secondary | ICD-10-CM | POA: Diagnosis not present

## 2019-11-04 DIAGNOSIS — D631 Anemia in chronic kidney disease: Secondary | ICD-10-CM | POA: Diagnosis not present

## 2019-11-05 DIAGNOSIS — N186 End stage renal disease: Secondary | ICD-10-CM | POA: Diagnosis not present

## 2019-11-05 DIAGNOSIS — Z992 Dependence on renal dialysis: Secondary | ICD-10-CM | POA: Diagnosis not present

## 2019-11-05 DIAGNOSIS — D631 Anemia in chronic kidney disease: Secondary | ICD-10-CM | POA: Diagnosis not present

## 2019-11-05 DIAGNOSIS — D509 Iron deficiency anemia, unspecified: Secondary | ICD-10-CM | POA: Diagnosis not present

## 2019-11-06 DIAGNOSIS — Z992 Dependence on renal dialysis: Secondary | ICD-10-CM | POA: Diagnosis not present

## 2019-11-06 DIAGNOSIS — D631 Anemia in chronic kidney disease: Secondary | ICD-10-CM | POA: Diagnosis not present

## 2019-11-06 DIAGNOSIS — N186 End stage renal disease: Secondary | ICD-10-CM | POA: Diagnosis not present

## 2019-11-06 DIAGNOSIS — D509 Iron deficiency anemia, unspecified: Secondary | ICD-10-CM | POA: Diagnosis not present

## 2019-11-07 DIAGNOSIS — N186 End stage renal disease: Secondary | ICD-10-CM | POA: Diagnosis not present

## 2019-11-07 DIAGNOSIS — D631 Anemia in chronic kidney disease: Secondary | ICD-10-CM | POA: Diagnosis not present

## 2019-11-07 DIAGNOSIS — Z992 Dependence on renal dialysis: Secondary | ICD-10-CM | POA: Diagnosis not present

## 2019-11-07 DIAGNOSIS — D509 Iron deficiency anemia, unspecified: Secondary | ICD-10-CM | POA: Diagnosis not present

## 2019-11-08 DIAGNOSIS — N186 End stage renal disease: Secondary | ICD-10-CM | POA: Diagnosis not present

## 2019-11-08 DIAGNOSIS — D631 Anemia in chronic kidney disease: Secondary | ICD-10-CM | POA: Diagnosis not present

## 2019-11-08 DIAGNOSIS — D509 Iron deficiency anemia, unspecified: Secondary | ICD-10-CM | POA: Diagnosis not present

## 2019-11-08 DIAGNOSIS — Z992 Dependence on renal dialysis: Secondary | ICD-10-CM | POA: Diagnosis not present

## 2019-11-09 DIAGNOSIS — D631 Anemia in chronic kidney disease: Secondary | ICD-10-CM | POA: Diagnosis not present

## 2019-11-09 DIAGNOSIS — N186 End stage renal disease: Secondary | ICD-10-CM | POA: Diagnosis not present

## 2019-11-09 DIAGNOSIS — Z992 Dependence on renal dialysis: Secondary | ICD-10-CM | POA: Diagnosis not present

## 2019-11-09 DIAGNOSIS — D509 Iron deficiency anemia, unspecified: Secondary | ICD-10-CM | POA: Diagnosis not present

## 2019-11-10 ENCOUNTER — Other Ambulatory Visit: Payer: Self-pay | Admitting: Family Medicine

## 2019-11-10 DIAGNOSIS — D631 Anemia in chronic kidney disease: Secondary | ICD-10-CM | POA: Diagnosis not present

## 2019-11-10 DIAGNOSIS — D509 Iron deficiency anemia, unspecified: Secondary | ICD-10-CM | POA: Diagnosis not present

## 2019-11-10 DIAGNOSIS — N186 End stage renal disease: Secondary | ICD-10-CM | POA: Diagnosis not present

## 2019-11-10 DIAGNOSIS — Z992 Dependence on renal dialysis: Secondary | ICD-10-CM | POA: Diagnosis not present

## 2019-11-10 NOTE — Telephone Encounter (Signed)
Requested Prescriptions  Pending Prescriptions Disp Refills   JANUVIA 25 MG tablet [Pharmacy Med Name: JANUVIA 25 MG TABLET] 90 tablet 0    Sig: TAKE 1 TABLET BY [place]     Endocrinology:  Diabetes - DPP-4 Inhibitors Failed - 11/10/2019  3:49 PM      Failed - HBA1C is between 0 and 7.9 and within 180 days    Hemoglobin A1C  Date Value Ref Range Status  10/06/2015 7.2  Final   HB A1C (BAYER DCA - WAIVED)  Date Value Ref Range Status  07/03/2019 9.1 (H) <7.0 % Final    Comment:                                          Diabetic Adult            <7.0                                       Healthy Adult        4.3 - 5.7                                                           (DCCT/NGSP) American Diabetes Association's Summary of Glycemic Recommendations for Adults with Diabetes: Hemoglobin A1c <7.0%. More stringent glycemic goals (A1c <6.0%) may further reduce complications at the cost of increased risk of hypoglycemia.          Failed - Cr in normal range and within 360 days    Creatinine, Ser  Date Value Ref Range Status  07/03/2019 4.36 (H) 0.76 - 1.27 mg/dL Final         Passed - Valid encounter within last 6 months    Recent Outpatient Visits          3 months ago Type 2 diabetes mellitus with stage 5 chronic kidney disease not on chronic dialysis, unspecified whether long term insulin use ([place])   [facility] [place], Megan P, DO   4 months ago Benign hypertension with CKD (chronic kidney disease) stage IV ([place])   [facility] [place], Megan P, DO   7 months ago Type 2 diabetes mellitus with stage 5 chronic kidney disease not on chronic dialysis, unspecified whether long term insulin use ([place])   [facility] [place], Megan P, DO   9 months ago Benign hypertension with CKD (chronic kidney disease) stage IV ([place])   [facility] [place], Megan P, DO   10 months ago 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 disease  ([place])   [facility] [place], Megan P, DO              nystatin (MYCOSTATIN/NYSTOP) powder [Pharmacy Med Name: NYSTATIN 100,000 UNIT/GM POWD] 15 g 0    Sig: APPLY 1 APPLICATION TOPICALLY 3 (THREE) TIMES DAILY.     Off-Protocol Failed - 11/10/2019  3:49 PM      Failed - Medication not assigned to a protocol, review manually.      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, unspecified whether long term insulin use ([place])   [facility] [place], Megan P, DO   4 months ago Benign hypertension with CKD (chronic kidney disease) stage IV ([place])   [facility] [place], Megan P, DO   7 months ago Type 2 diabetes mellitus with stage 5 chronic kidney disease not on chronic dialysis, unspecified whether long term insulin use ([place])   [place], Megan P, DO   9 months ago Benign hypertension with CKD (chronic kidney disease) stage IV ([place])   [facility] [place], Megan P, DO   10 months ago 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 disease ([place])   [place], Megan P, DO

## 2019-11-10 NOTE — Telephone Encounter (Signed)
Requested medications are due for refill today?  Yes  Requested medications are on active medication list? Yes  Last Refill:  07/03/2019  # 15 g with no refills  Future visit scheduled? No   Notes to Clinic:  This medication is not assigned to a protocol.

## 2019-11-11 DIAGNOSIS — Z992 Dependence on renal dialysis: Secondary | ICD-10-CM | POA: Diagnosis not present

## 2019-11-11 DIAGNOSIS — D509 Iron deficiency anemia, unspecified: Secondary | ICD-10-CM | POA: Diagnosis not present

## 2019-11-11 DIAGNOSIS — N186 End stage renal disease: Secondary | ICD-10-CM | POA: Diagnosis not present

## 2019-11-11 DIAGNOSIS — D631 Anemia in chronic kidney disease: Secondary | ICD-10-CM | POA: Diagnosis not present

## 2019-11-12 DIAGNOSIS — I2510 Atherosclerotic heart disease of native coronary artery without angina pectoris: Secondary | ICD-10-CM | POA: Diagnosis not present

## 2019-11-12 DIAGNOSIS — Z992 Dependence on renal dialysis: Secondary | ICD-10-CM | POA: Diagnosis not present

## 2019-11-12 DIAGNOSIS — Z23 Encounter for immunization: Secondary | ICD-10-CM | POA: Diagnosis not present

## 2019-11-12 DIAGNOSIS — E1122 Type 2 diabetes mellitus with diabetic chronic kidney disease: Secondary | ICD-10-CM | POA: Diagnosis not present

## 2019-11-12 DIAGNOSIS — N186 End stage renal disease: Secondary | ICD-10-CM | POA: Diagnosis not present

## 2019-11-12 DIAGNOSIS — D509 Iron deficiency anemia, unspecified: Secondary | ICD-10-CM | POA: Diagnosis not present

## 2019-11-12 DIAGNOSIS — R609 Edema, unspecified: Secondary | ICD-10-CM | POA: Diagnosis not present

## 2019-11-12 DIAGNOSIS — I340 Nonrheumatic mitral (valve) insufficiency: Secondary | ICD-10-CM | POA: Diagnosis not present

## 2019-11-12 DIAGNOSIS — Z951 Presence of aortocoronary bypass graft: Secondary | ICD-10-CM | POA: Diagnosis not present

## 2019-11-12 DIAGNOSIS — R011 Cardiac murmur, unspecified: Secondary | ICD-10-CM | POA: Diagnosis not present

## 2019-11-12 DIAGNOSIS — D631 Anemia in chronic kidney disease: Secondary | ICD-10-CM | POA: Diagnosis not present

## 2019-11-12 DIAGNOSIS — I10 Essential (primary) hypertension: Secondary | ICD-10-CM | POA: Diagnosis not present

## 2019-11-12 DIAGNOSIS — E785 Hyperlipidemia, unspecified: Secondary | ICD-10-CM | POA: Diagnosis not present

## 2019-11-13 DIAGNOSIS — Z992 Dependence on renal dialysis: Secondary | ICD-10-CM | POA: Diagnosis not present

## 2019-11-13 DIAGNOSIS — N186 End stage renal disease: Secondary | ICD-10-CM | POA: Diagnosis not present

## 2019-11-13 DIAGNOSIS — D509 Iron deficiency anemia, unspecified: Secondary | ICD-10-CM | POA: Diagnosis not present

## 2019-11-13 DIAGNOSIS — D631 Anemia in chronic kidney disease: Secondary | ICD-10-CM | POA: Diagnosis not present

## 2019-11-14 DIAGNOSIS — N186 End stage renal disease: Secondary | ICD-10-CM | POA: Diagnosis not present

## 2019-11-14 DIAGNOSIS — N2581 Secondary hyperparathyroidism of renal origin: Secondary | ICD-10-CM | POA: Diagnosis not present

## 2019-11-14 DIAGNOSIS — Z992 Dependence on renal dialysis: Secondary | ICD-10-CM | POA: Diagnosis not present

## 2019-11-14 DIAGNOSIS — D631 Anemia in chronic kidney disease: Secondary | ICD-10-CM | POA: Diagnosis not present

## 2019-11-14 DIAGNOSIS — D509 Iron deficiency anemia, unspecified: Secondary | ICD-10-CM | POA: Diagnosis not present

## 2019-11-15 DIAGNOSIS — D509 Iron deficiency anemia, unspecified: Secondary | ICD-10-CM | POA: Diagnosis not present

## 2019-11-15 DIAGNOSIS — N186 End stage renal disease: Secondary | ICD-10-CM | POA: Diagnosis not present

## 2019-11-15 DIAGNOSIS — D631 Anemia in chronic kidney disease: Secondary | ICD-10-CM | POA: Diagnosis not present

## 2019-11-15 DIAGNOSIS — Z992 Dependence on renal dialysis: Secondary | ICD-10-CM | POA: Diagnosis not present

## 2019-11-15 DIAGNOSIS — N2581 Secondary hyperparathyroidism of renal origin: Secondary | ICD-10-CM | POA: Diagnosis not present

## 2019-11-16 DIAGNOSIS — N186 End stage renal disease: Secondary | ICD-10-CM | POA: Diagnosis not present

## 2019-11-16 DIAGNOSIS — N2581 Secondary hyperparathyroidism of renal origin: Secondary | ICD-10-CM | POA: Diagnosis not present

## 2019-11-16 DIAGNOSIS — Z992 Dependence on renal dialysis: Secondary | ICD-10-CM | POA: Diagnosis not present

## 2019-11-16 DIAGNOSIS — D631 Anemia in chronic kidney disease: Secondary | ICD-10-CM | POA: Diagnosis not present

## 2019-11-16 DIAGNOSIS — D509 Iron deficiency anemia, unspecified: Secondary | ICD-10-CM | POA: Diagnosis not present

## 2019-11-17 DIAGNOSIS — N2581 Secondary hyperparathyroidism of renal origin: Secondary | ICD-10-CM | POA: Diagnosis not present

## 2019-11-17 DIAGNOSIS — D509 Iron deficiency anemia, unspecified: Secondary | ICD-10-CM | POA: Diagnosis not present

## 2019-11-17 DIAGNOSIS — N186 End stage renal disease: Secondary | ICD-10-CM | POA: Diagnosis not present

## 2019-11-17 DIAGNOSIS — Z992 Dependence on renal dialysis: Secondary | ICD-10-CM | POA: Diagnosis not present

## 2019-11-17 DIAGNOSIS — D631 Anemia in chronic kidney disease: Secondary | ICD-10-CM | POA: Diagnosis not present

## 2019-11-18 DIAGNOSIS — N2581 Secondary hyperparathyroidism of renal origin: Secondary | ICD-10-CM | POA: Diagnosis not present

## 2019-11-18 DIAGNOSIS — Z992 Dependence on renal dialysis: Secondary | ICD-10-CM | POA: Diagnosis not present

## 2019-11-18 DIAGNOSIS — D631 Anemia in chronic kidney disease: Secondary | ICD-10-CM | POA: Diagnosis not present

## 2019-11-18 DIAGNOSIS — D509 Iron deficiency anemia, unspecified: Secondary | ICD-10-CM | POA: Diagnosis not present

## 2019-11-18 DIAGNOSIS — N186 End stage renal disease: Secondary | ICD-10-CM | POA: Diagnosis not present

## 2019-11-19 ENCOUNTER — Ambulatory Visit: Payer: Medicare Other | Admitting: Pharmacist

## 2019-11-19 DIAGNOSIS — D631 Anemia in chronic kidney disease: Secondary | ICD-10-CM | POA: Diagnosis not present

## 2019-11-19 DIAGNOSIS — E1122 Type 2 diabetes mellitus with diabetic chronic kidney disease: Secondary | ICD-10-CM

## 2019-11-19 DIAGNOSIS — N186 End stage renal disease: Secondary | ICD-10-CM | POA: Diagnosis not present

## 2019-11-19 DIAGNOSIS — N2581 Secondary hyperparathyroidism of renal origin: Secondary | ICD-10-CM | POA: Diagnosis not present

## 2019-11-19 DIAGNOSIS — Z992 Dependence on renal dialysis: Secondary | ICD-10-CM | POA: Diagnosis not present

## 2019-11-19 DIAGNOSIS — D509 Iron deficiency anemia, unspecified: Secondary | ICD-10-CM | POA: Diagnosis not present

## 2019-11-20 DIAGNOSIS — E119 Type 2 diabetes mellitus without complications: Secondary | ICD-10-CM | POA: Diagnosis not present

## 2019-11-20 DIAGNOSIS — D509 Iron deficiency anemia, unspecified: Secondary | ICD-10-CM | POA: Diagnosis not present

## 2019-11-20 DIAGNOSIS — D631 Anemia in chronic kidney disease: Secondary | ICD-10-CM | POA: Diagnosis not present

## 2019-11-20 DIAGNOSIS — N186 End stage renal disease: Secondary | ICD-10-CM | POA: Diagnosis not present

## 2019-11-20 DIAGNOSIS — N2581 Secondary hyperparathyroidism of renal origin: Secondary | ICD-10-CM | POA: Diagnosis not present

## 2019-11-20 DIAGNOSIS — Z992 Dependence on renal dialysis: Secondary | ICD-10-CM | POA: Diagnosis not present

## 2019-11-20 LAB — BASIC METABOLIC PANEL
Glucose: 106
Potassium: 3.8 (ref 3.4–5.3)

## 2019-11-20 LAB — COMPREHENSIVE METABOLIC PANEL
Albumin: 3.3 — AB (ref 3.5–5.0)
Calcium: 9.5 (ref 8.7–10.7)

## 2019-11-20 LAB — HEMOGLOBIN A1C: Hemoglobin A1C: 6.1

## 2019-11-20 LAB — LIPID PANEL: Cholesterol: 104 (ref 0–200)

## 2019-11-20 LAB — VITAMIN D 25 HYDROXY (VIT D DEFICIENCY, FRACTURES): Vit D, 25-Hydroxy: 35

## 2019-11-20 LAB — CBC AND DIFFERENTIAL: Hemoglobin: 11.6 — AB (ref 13.5–17.5)

## 2019-11-21 DIAGNOSIS — N2581 Secondary hyperparathyroidism of renal origin: Secondary | ICD-10-CM | POA: Diagnosis not present

## 2019-11-21 DIAGNOSIS — D631 Anemia in chronic kidney disease: Secondary | ICD-10-CM | POA: Diagnosis not present

## 2019-11-21 DIAGNOSIS — D509 Iron deficiency anemia, unspecified: Secondary | ICD-10-CM | POA: Diagnosis not present

## 2019-11-21 DIAGNOSIS — Z992 Dependence on renal dialysis: Secondary | ICD-10-CM | POA: Diagnosis not present

## 2019-11-21 DIAGNOSIS — N186 End stage renal disease: Secondary | ICD-10-CM | POA: Diagnosis not present

## 2019-11-22 DIAGNOSIS — Z992 Dependence on renal dialysis: Secondary | ICD-10-CM | POA: Diagnosis not present

## 2019-11-22 DIAGNOSIS — N186 End stage renal disease: Secondary | ICD-10-CM | POA: Diagnosis not present

## 2019-11-22 DIAGNOSIS — N2581 Secondary hyperparathyroidism of renal origin: Secondary | ICD-10-CM | POA: Diagnosis not present

## 2019-11-22 DIAGNOSIS — D509 Iron deficiency anemia, unspecified: Secondary | ICD-10-CM | POA: Diagnosis not present

## 2019-11-22 DIAGNOSIS — D631 Anemia in chronic kidney disease: Secondary | ICD-10-CM | POA: Diagnosis not present

## 2019-11-23 DIAGNOSIS — Z992 Dependence on renal dialysis: Secondary | ICD-10-CM | POA: Diagnosis not present

## 2019-11-23 DIAGNOSIS — D631 Anemia in chronic kidney disease: Secondary | ICD-10-CM | POA: Diagnosis not present

## 2019-11-23 DIAGNOSIS — D509 Iron deficiency anemia, unspecified: Secondary | ICD-10-CM | POA: Diagnosis not present

## 2019-11-23 DIAGNOSIS — N2581 Secondary hyperparathyroidism of renal origin: Secondary | ICD-10-CM | POA: Diagnosis not present

## 2019-11-23 DIAGNOSIS — N186 End stage renal disease: Secondary | ICD-10-CM | POA: Diagnosis not present

## 2019-11-23 NOTE — Patient Instructions (Addendum)
Visit Information  It was a pleasure speaking with you today! Thank you for letting me be a part of your care team. Please call with any questions or concerns.  Goals Addressed            This Visit's Progress   . Pharmacy Care Plan       CARE PLAN ENTRY (see longitudinal plan of care for additional care plan information)  Current Barriers:  . Chronic Disease Management support, education, and care coordination needs related to Hypertension, Hyperlipidemia, Diabetes, Heart Failure, Coronary Artery Disease, Chronic Kidney Disease, and Alzheimers dementia   Hypertension/ESRD/CHF BP Readings from Last 3 Encounters:  07/16/19 137/72  07/03/19 (!) 151/69  06/03/19 140/65   . Pharmacist Clinical Goal(s): o Over the next 60  days, patient will work with PharmD and providers to maintain BP goal <140/90 . Current regimen:  . Carvedilol 12.5mg  bid . Furosemide 80 mg bid . Isosorbide mononitrate 45 mg daily . Interventions: . Comprehensive medication review performed, medication list updated in electronic medical record . Inter-disciplinary care team collaboration (see longitudinal plan of care)  . Patient self care activities - Over the next 30 days, patient will: o Check BP daily, document, and provide at future appointments o Ensure daily salt intake < 2300 mg/day  Hyperlipidemia/ CAD/ Previous CVA Lab Results  Component Value Date/Time   LDLCALC 45 07/03/2019 11:45 AM   . Pharmacist Clinical Goal(s): o Over the next 60 days, patient will work with PharmD and providers to maintain LDL goal < 70 . Current regimen:  . Atorvastatin 80 mg daily . Aspirin 81 mg qod . ezetemibe 10 mg qd . Clopidogrel 75 mg qd . Interventions: . Comprehensive medication review performed, medication list updated in electronic medical record . Inter-disciplinary care team collaboration (see longitudinal plan of care)  . Patient self care activities - Over the next 60 days, patient will: o Take  medications as prescribed  Diabetes Lab Results  Component Value Date/Time   HGBA1C 9.1 (H) 07/03/2019 11:42 AM   HGBA1C 5.6 04/04/2019 10:40 AM   HGBA1C 7.2 10/06/2015 12:00 AM   . Pharmacist Clinical Goal(s): o Over the next 60 days, patient will work with PharmD and providers to achieve A1c goal <8% . Current regimen:  o Januvia 25 mg daily . Interventions: o Counseled patient to discontinue Tradjenta and continue Januvia per chart documentation.  o Will ask front office staff to call patient to schedule follow up appointment with PCP. Marland Kitchen Patient self care activities - Over the next 60 days, patient will: o Check blood sugar once daily, document, and provide at future appointments o Contact provider with any episodes of hypoglycemia    Medication management . Pharmacist Clinical Goal(s): o Over the next 60 days, patient will work with PharmD and providers to achieve optimal medication adherence . Current pharmacy: [facility] . Interventions o Comprehensive medication review performed. o Continue current medication management strategy. . Patient self care activities - Over the next 60 days, patient will: o Focus on medication adherence by using pill box. o Take medications as prescribed o Report any questions or concerns to PharmD and/or provider(s)  Initial goal documentation        Ray Lamb was given information about Chronic Care Management services today including:  1. CCM service includes personalized support from designated clinical staff supervised by his physician, including individualized plan of care and coordination with other care providers 2. 24/7 contact phone numbers for assistance for urgent and routine  care needs. 3. Standard insurance, coinsurance, copays and deductibles apply for chronic care management only during months in which we provide at least 20 minutes of these services. Most insurances cover these services at 100%, however patients may be responsible  for any copay, coinsurance and/or deductible if applicable. This service may help you avoid the need for more expensive face-to-face services. 4. Only one practitioner may furnish and bill the service in a calendar month. 5. The patient may stop CCM services at any time (effective at the end of the month) by phone call to the office staff.  Patient agreed to services and verbal consent obtained.   The patient verbalized understanding of instructions provided today and agreed to receive a mailed copy of patient instruction and/or educational materials. Telephone follow up appointment with pharmacy team member scheduled for: 2 months  Junita Push. Kenton Kingfisher PharmD, BCPS Clinical Pharmacist [phone]  Hypertension, Adult Hypertension is another name for high blood pressure. High blood pressure forces your heart to work harder to pump blood. This can cause problems over time. There are two numbers in a blood pressure reading. There is a top number (systolic) over a bottom number (diastolic). It is best to have a blood pressure that is below 120/80. Healthy choices can help lower your blood pressure, or you may need medicine to help lower it. What are the causes? The cause of this condition is not known. Some conditions may be related to high blood pressure. What increases the risk?  Smoking.  Having type 2 diabetes mellitus, high cholesterol, or both.  Not getting enough exercise or physical activity.  Being overweight.  Having too much fat, sugar, calories, or salt (sodium) in your diet.  Drinking too much alcohol.  Having long-term (chronic) kidney disease.  Having a family history of high blood pressure.  Age. Risk increases with age.  Race. You may be at higher risk if you are African American.  Gender. Men are at higher risk than women before age 15. After age 98, women are at higher risk than men.  Having obstructive sleep apnea.  Stress. What are the signs or  symptoms?  High blood pressure may not cause symptoms. Very high blood pressure (hypertensive crisis) may cause: ? Headache. ? Feelings of worry or nervousness (anxiety). ? Shortness of breath. ? Nosebleed. ? A feeling of being sick to your stomach (nausea). ? Throwing up (vomiting). ? Changes in how you see. ? Very bad chest pain. ? Seizures. How is this treated?  This condition is treated by making healthy lifestyle changes, such as: ? Eating healthy foods. ? Exercising more. ? Drinking less alcohol.  Your health care provider may prescribe medicine if lifestyle changes are not enough to get your blood pressure under control, and if: ? Your top number is above 130. ? Your bottom number is above 80.  Your personal target blood pressure may vary. Follow these instructions at home: Eating and drinking   If told, follow the DASH eating plan. To follow this plan: ? Fill one half of your plate at each meal with fruits and vegetables. ? Fill one fourth of your plate at each meal with whole grains. Whole grains include whole-wheat pasta, brown rice, and whole-grain bread. ? Eat or drink low-fat dairy products, such as skim milk or low-fat yogurt. ? Fill one fourth of your plate at each meal with low-fat (lean) proteins. Low-fat proteins include fish, chicken without skin, eggs, beans, and tofu. ? Avoid fatty meat, cured and  processed meat, or chicken with skin. ? Avoid pre-made or processed food.  Eat less than 1,500 mg of salt each day.  Do not drink alcohol if: ? Your doctor tells you not to drink. ? You are pregnant, may be pregnant, or are planning to become pregnant.  If you drink alcohol: ? Limit how much you use to:  0-1 drink a day for women.  0-2 drinks a day for men. ? Be aware of how much alcohol is in your drink. In the [place] one drink equals one 12 oz bottle of beer (355 mL), one 5 oz glass of wine (148 mL), or one 1 oz glass of hard liquor (44  mL). Lifestyle   Work with your doctor to stay at a healthy weight or to lose weight. Ask your doctor what the best weight is for you.  Get at least 30 minutes of exercise most days of the week. This may include walking, swimming, or biking.  Get at least 30 minutes of exercise that strengthens your muscles (resistance exercise) at least 3 days a week. This may include lifting weights or doing Pilates.  Do not use any products that contain nicotine or tobacco, such as cigarettes, e-cigarettes, and chewing tobacco. If you need help quitting, ask your doctor.  Check your blood pressure at home as told by your doctor.  Keep all follow-up visits as told by your doctor. This is important. Medicines  Take over-the-counter and prescription medicines only as told by your doctor. Follow directions carefully.  Do not skip doses of blood pressure medicine. The medicine does not work as well if you skip doses. Skipping doses also puts you at risk for problems.  Ask your doctor about side effects or reactions to medicines that you should watch for. Contact a doctor if you:  Think you are having a reaction to the medicine you are taking.  Have headaches that keep coming back (recurring).  Feel dizzy.  Have swelling in your ankles.  Have trouble with your vision. Get help right away if you:  Get a very bad headache.  Start to feel mixed up (confused).  Feel weak or numb.  Feel faint.  Have very bad pain in your: ? Chest. ? Belly (abdomen).  Throw up more than once.  Have trouble breathing. Summary  Hypertension is another name for high blood pressure.  High blood pressure forces your heart to work harder to pump blood.  For most people, a normal blood pressure is less than 120/80.  Making healthy choices can help lower blood pressure. If your blood pressure does not get lower with healthy choices, you may need to take medicine. This information is not intended to replace  advice given to you by your health care provider. Make sure you discuss any questions you have with your health care provider. Document Revised: 10/10/2017 Document Reviewed: 10/10/2017 Elsevier Patient Education  2020 [employer].

## 2019-11-23 NOTE — Chronic Care Management (AMB) (Signed)
Chronic Care Management Pharmacy  Name: Ray Lamb  MRN: [id number] DOB: Dec 08, 1930   Chief Complaint/ HPI  Ray Lamb,  86 y.o. , male presents for their Initial CCM visit with the clinical pharmacist In office.  PCP : Valerie Roys, DO Patient Care Team: Valerie Roys, DO as PCP - General (Family Medicine) Murlean Iba, MD (Internal Medicine) Dionisio David, MD as Consulting Physician (Cardiology) Sharlotte Alamo, [place] (Podiatry) Ronita Hipps, Elmer Ramp., MD (Dentistry) Lucilla Lame, MD as Consulting Physician (Gastroenterology) Vladimir Crofts, MD as Consulting Physician (Neurology) Minor, Dalbert Garnet, RN (Inactive) as [place] Management Sindy Guadeloupe, MD as Consulting Physician (Oncology) Vladimir Faster, [facility] as Pharmacist (Pharmacist)  Their chronic conditions include: Hypertension, Hyperlipidemia, Diabetes, Heart Failure, Coronary Artery Disease, Chronic Kidney Disease and Alzheimers dementia   Office Visits: 07/16/19 - Dr. Wynetta Emery - Tradjenta 5 mg qd pt to follow up in 3 weeks. 07/03/19- Dr. Wynetta Emery- bloodwork, A1c 9.1, BP 151/69  Consult Visit: 09/01/19- Tobias Alexander, PA- Neurology - dementia vascular and alxheimers , donepezila and Namenda stopped, melatonin for sleep 02/25/19- Dr. Lucky Cowboy, Vascular-   Allergies  Allergen Reactions  . Bee Venom Hives  . Iodinated Diagnostic Agents Rash  . Metrizamide Rash  . Other Rash and Other (See Comments)    Dye    Medications: Outpatient Encounter Medications as of 11/19/2019  Medication Sig Note  . ASPIRIN 81 PO Take by mouth every other day.    Marland Kitchen atorvastatin (LIPITOR) 80 MG tablet Take 1 tablet (80 mg total) by mouth at bedtime.   . carvedilol (COREG) 12.5 MG tablet Take by mouth 2 (two) times daily.    . clopidogrel (PLAVIX) 75 MG tablet Take 1 tablet (75 mg total) by mouth daily.   Marland Kitchen ezetimibe (ZETIA) 10 MG tablet TAKE 1 TABLET BY MOUTH EVERY DAY   . furosemide (LASIX) 40 MG tablet Take 80 mg by mouth 2  (two) times daily.  11/19/2019: Takes scheduled   . isosorbide mononitrate (IMDUR) 30 MG 24 hr tablet Take 1.5 tablets (45 mg total) by mouth daily.   Marland Kitchen JANUVIA 25 MG tablet TAKE 1 TABLET BY MOUTH EVERY DAY   . Lancets (ONETOUCH ULTRASOFT) lancets USE TO CHECK BLOOD SUGAR TWICE A DAY   . Melatonin 10 MG TABS Take 10 mg by mouth at bedtime.   . mirtazapine (REMERON) 7.5 MG tablet Take 7.5 mg by mouth as needed.    . Multiple Vitamins-Minerals (CENTRUM SILVER ULTRA MENS PO) Take by mouth daily.   Marland Kitchen nystatin (MYCOSTATIN/NYSTOP) powder APPLY 1 APPLICATION TOPICALLY 3 (THREE) TIMES DAILY.   Marland Kitchen ondansetron (ZOFRAN) 4 MG tablet Take 4 mg by mouth every 8 (eight) hours as needed.   . timolol (TIMOPTIC) 0.5 % ophthalmic solution Place 1 drop into both eyes daily.    . TURMERIC PO Take 1,300 mg by mouth daily.    . Loratadine (CLARITIN PO) Take 1 tablet by mouth daily as needed.  (Patient not taking: Reported on 11/19/2019)   . memantine (NAMENDA) 5 MG tablet Take 1 tablet by mouth 2 (two) times daily. (Patient not taking: Reported on 11/19/2019)   . traMADol (ULTRAM) 50 MG tablet Take 1 tablet (50 mg total) by mouth every 6 (six) hours as needed. (Patient not taking: Reported on 07/16/2019)    Facility-Administered Encounter Medications as of 11/19/2019  Medication  . sodium chloride flush (NS) 0.9 % injection 3 mL    Wt Readings from Last 3  Encounters:  07/03/19 137 lb 3.2 oz (62.2 kg)  06/03/19 130 lb (59 kg)  03/07/19 150 lb (68 kg)    Current Diagnosis/Assessment:    Goals Addressed   None     Diabetes   A1c goal <8%  Recent Relevant Labs: Lab Results  Component Value Date/Time   HGBA1C 9.1 (H) 07/03/2019 11:42 AM   HGBA1C 5.6 04/04/2019 10:40 AM   HGBA1C 7.2 10/06/2015 12:00 AM   MICROALBUR 150 (H) 07/03/2019 11:42 AM   MICROALBUR 150 (H) 01/02/2019 11:26 AM    Last diabetic Eye exam:  Lab Results  Component Value Date/Time   HMDIABEYEEXA No Retinopathy 04/02/2019 12:00 AM      Last diabetic Foot exam: No results found for: HMDIABFOOTEX   Checking BG: Daily  Recent FBG Readings: 150-170  Patient has failed these meds in past: glipizide Patient is currently uncontrolled based on May A1C on the following medications: . Januvia 25 mg daily   We discussed: Patient has been taking Tradjenta 5 mg and Januvia 25 mg daily. Unclear how long this has been ongoing. Tradjenta prescribed in June. Advised to discontinue Tradjenta and continue Januvia as these medications work the same way. Will ask front office staff to help get scheduled for follow-up with Dr. Wynetta Emery. Patient did not receive MyChart message in June to follow up. Patient tells me his most recent A1c at dialysis was 7.7 but I can not verify in chart. Will discuss with PCP.  Plan  Recommend patient discontinue Tradjenta and continue Januvia. Follow up with PCP for A1C if not done at dialysis.  Hypertension/ ESRD/ CHF  On home peritoneal dialysis nightly BP goal is:  <140/90  Office blood pressures are  BP Readings from Last 3 Encounters:  07/16/19 137/72  07/03/19 (!) 151/69  06/03/19 140/65   Patient checks BP at home daily Patient home BP readings are ranging: 119-130/50s  Patient has failed these meds in the past: NA Patient is currently controlled on the following medications:  . Carvedilol 12.67m bid . Furosemide 80 mg bid . Isosorbide mononitrate 45 mg daily   We discussed patient is on nightly peritoneal dialysis at home. Follows up at dialysis center weekly. Documents daily BP readings. Patient is managing his own medications, Glucometer and BP checks.  Plan  Continue current medications.      Hyperlipidemia/ CAD/h/o CVA   LDL goal < 70  Lipid Panel     Component Value Date/Time   CHOL 97 (L) 07/03/2019 1145   CHOL 103 09/27/2018 0825   TRIG 74 07/03/2019 1145   TRIG 51 09/27/2018 0825   HDL 36 (L) 07/03/2019 1145   LDLCALC 45 07/03/2019 1145    Hepatic Function Latest  Ref Rng & Units 07/03/2019 01/24/2019 01/02/2019  Total Protein 6.0 - 8.5 g/dL 7.6 7.7 8.3  Albumin 3.6 - 4.6 g/dL 3.2(L) 2.7(L) 3.7  AST 0 - 40 IU/L 30 22 26   ALT 0 - 44 IU/L 22 16 15   Alk Phosphatase 48 - 121 IU/L 94 74 87  Total Bilirubin 0.0 - 1.2 mg/dL 0.3 0.7 0.3     The ASCVD Risk score ([place]ock Hill, et al., 2013) failed to calculate for the following reasons:   The 2013 ASCVD risk score is only valid for ages 462to 770  The patient has a prior MI or stroke diagnosis   Patient has failed these meds in past: NA Patient is currently controlled on the following medications:  . Atorvastatin 80 mg  daily . Aspirin 81 mg qod . ezetemibe 10 mg qd . Clopidogrel 75 mg qd  We discussed:  diet and exercise extensively  Plan  Continue current medications  Health Maintenance   Patient is currently taking on the following medications:  . Timolol 0.5 % ophthalmic drops . Melatonin 10 mg qhs . Centrum silver qd  We discussed:  Patient is satisfied with this regimen.  Plan  Continue current medications  Vaccines   Reviewed and discussed patient's vaccination history.    Immunization History  Administered Date(s) Administered  . Fluad Quad(high Dose 65+) 11/27/2018  . Influenza, High Dose Seasonal PF 11/26/2015, 12/11/2016, 12/05/2017  . Influenza,inj,Quad PF,6+ Mos 12/03/2014  . Influenza-Unspecified 11/12/2013, 10/15/2018  . Moderna SARS-COVID-2 Vaccination 04/01/2019, 04/29/2019  . PPD Test 10/05/2017  . Pneumococcal Conjugate-13 09/03/2013  . Pneumococcal Polysaccharide-23 10/15/1995, 11/13/2000  . Pneumococcal-Unspecified 10/15/2018  . Td 08/08/2004, 10/06/2015  . Zoster 02/26/2006    Plan  Recommended patient receive flu vaccine.    Medication Management   Pt uses [facility] for all medications Uses pill box? Yes Pt endorses  compliance   Plan  Continue current medication management strategy.    Follow up: 2  month phone visit  Junita Push. Kenton Kingfisher  PharmD, [facility] [phone]

## 2019-11-24 DIAGNOSIS — N186 End stage renal disease: Secondary | ICD-10-CM | POA: Diagnosis not present

## 2019-11-24 DIAGNOSIS — D631 Anemia in chronic kidney disease: Secondary | ICD-10-CM | POA: Diagnosis not present

## 2019-11-24 DIAGNOSIS — D509 Iron deficiency anemia, unspecified: Secondary | ICD-10-CM | POA: Diagnosis not present

## 2019-11-24 DIAGNOSIS — N2581 Secondary hyperparathyroidism of renal origin: Secondary | ICD-10-CM | POA: Diagnosis not present

## 2019-11-24 DIAGNOSIS — Z992 Dependence on renal dialysis: Secondary | ICD-10-CM | POA: Diagnosis not present

## 2019-11-25 DIAGNOSIS — N186 End stage renal disease: Secondary | ICD-10-CM | POA: Diagnosis not present

## 2019-11-25 DIAGNOSIS — D631 Anemia in chronic kidney disease: Secondary | ICD-10-CM | POA: Diagnosis not present

## 2019-11-25 DIAGNOSIS — N2581 Secondary hyperparathyroidism of renal origin: Secondary | ICD-10-CM | POA: Diagnosis not present

## 2019-11-25 DIAGNOSIS — Z992 Dependence on renal dialysis: Secondary | ICD-10-CM | POA: Diagnosis not present

## 2019-11-25 DIAGNOSIS — D509 Iron deficiency anemia, unspecified: Secondary | ICD-10-CM | POA: Diagnosis not present

## 2019-11-26 DIAGNOSIS — N186 End stage renal disease: Secondary | ICD-10-CM | POA: Diagnosis not present

## 2019-11-26 DIAGNOSIS — D631 Anemia in chronic kidney disease: Secondary | ICD-10-CM | POA: Diagnosis not present

## 2019-11-26 DIAGNOSIS — Z992 Dependence on renal dialysis: Secondary | ICD-10-CM | POA: Diagnosis not present

## 2019-11-26 DIAGNOSIS — D509 Iron deficiency anemia, unspecified: Secondary | ICD-10-CM | POA: Diagnosis not present

## 2019-11-26 DIAGNOSIS — N2581 Secondary hyperparathyroidism of renal origin: Secondary | ICD-10-CM | POA: Diagnosis not present

## 2019-11-27 DIAGNOSIS — N186 End stage renal disease: Secondary | ICD-10-CM | POA: Diagnosis not present

## 2019-11-27 DIAGNOSIS — D631 Anemia in chronic kidney disease: Secondary | ICD-10-CM | POA: Diagnosis not present

## 2019-11-27 DIAGNOSIS — D509 Iron deficiency anemia, unspecified: Secondary | ICD-10-CM | POA: Diagnosis not present

## 2019-11-27 DIAGNOSIS — N2581 Secondary hyperparathyroidism of renal origin: Secondary | ICD-10-CM | POA: Diagnosis not present

## 2019-11-27 DIAGNOSIS — Z992 Dependence on renal dialysis: Secondary | ICD-10-CM | POA: Diagnosis not present

## 2019-11-28 DIAGNOSIS — D631 Anemia in chronic kidney disease: Secondary | ICD-10-CM | POA: Diagnosis not present

## 2019-11-28 DIAGNOSIS — N2581 Secondary hyperparathyroidism of renal origin: Secondary | ICD-10-CM | POA: Diagnosis not present

## 2019-11-28 DIAGNOSIS — N186 End stage renal disease: Secondary | ICD-10-CM | POA: Diagnosis not present

## 2019-11-28 DIAGNOSIS — Z992 Dependence on renal dialysis: Secondary | ICD-10-CM | POA: Diagnosis not present

## 2019-11-28 DIAGNOSIS — D509 Iron deficiency anemia, unspecified: Secondary | ICD-10-CM | POA: Diagnosis not present

## 2019-11-29 DIAGNOSIS — N2581 Secondary hyperparathyroidism of renal origin: Secondary | ICD-10-CM | POA: Diagnosis not present

## 2019-11-29 DIAGNOSIS — D509 Iron deficiency anemia, unspecified: Secondary | ICD-10-CM | POA: Diagnosis not present

## 2019-11-29 DIAGNOSIS — Z992 Dependence on renal dialysis: Secondary | ICD-10-CM | POA: Diagnosis not present

## 2019-11-29 DIAGNOSIS — N186 End stage renal disease: Secondary | ICD-10-CM | POA: Diagnosis not present

## 2019-11-29 DIAGNOSIS — D631 Anemia in chronic kidney disease: Secondary | ICD-10-CM | POA: Diagnosis not present

## 2019-11-30 DIAGNOSIS — N2581 Secondary hyperparathyroidism of renal origin: Secondary | ICD-10-CM | POA: Diagnosis not present

## 2019-11-30 DIAGNOSIS — N186 End stage renal disease: Secondary | ICD-10-CM | POA: Diagnosis not present

## 2019-11-30 DIAGNOSIS — Z992 Dependence on renal dialysis: Secondary | ICD-10-CM | POA: Diagnosis not present

## 2019-11-30 DIAGNOSIS — D631 Anemia in chronic kidney disease: Secondary | ICD-10-CM | POA: Diagnosis not present

## 2019-11-30 DIAGNOSIS — D509 Iron deficiency anemia, unspecified: Secondary | ICD-10-CM | POA: Diagnosis not present

## 2019-12-01 DIAGNOSIS — L298 Other pruritus: Secondary | ICD-10-CM | POA: Diagnosis not present

## 2019-12-01 DIAGNOSIS — D509 Iron deficiency anemia, unspecified: Secondary | ICD-10-CM | POA: Diagnosis not present

## 2019-12-01 DIAGNOSIS — D631 Anemia in chronic kidney disease: Secondary | ICD-10-CM | POA: Diagnosis not present

## 2019-12-01 DIAGNOSIS — N2581 Secondary hyperparathyroidism of renal origin: Secondary | ICD-10-CM | POA: Diagnosis not present

## 2019-12-01 DIAGNOSIS — N186 End stage renal disease: Secondary | ICD-10-CM | POA: Diagnosis not present

## 2019-12-01 DIAGNOSIS — Z992 Dependence on renal dialysis: Secondary | ICD-10-CM | POA: Diagnosis not present

## 2019-12-02 DIAGNOSIS — D509 Iron deficiency anemia, unspecified: Secondary | ICD-10-CM | POA: Diagnosis not present

## 2019-12-02 DIAGNOSIS — D631 Anemia in chronic kidney disease: Secondary | ICD-10-CM | POA: Diagnosis not present

## 2019-12-02 DIAGNOSIS — Z992 Dependence on renal dialysis: Secondary | ICD-10-CM | POA: Diagnosis not present

## 2019-12-02 DIAGNOSIS — N2581 Secondary hyperparathyroidism of renal origin: Secondary | ICD-10-CM | POA: Diagnosis not present

## 2019-12-02 DIAGNOSIS — N186 End stage renal disease: Secondary | ICD-10-CM | POA: Diagnosis not present

## 2019-12-03 DIAGNOSIS — N186 End stage renal disease: Secondary | ICD-10-CM | POA: Diagnosis not present

## 2019-12-03 DIAGNOSIS — D631 Anemia in chronic kidney disease: Secondary | ICD-10-CM | POA: Diagnosis not present

## 2019-12-03 DIAGNOSIS — Z992 Dependence on renal dialysis: Secondary | ICD-10-CM | POA: Diagnosis not present

## 2019-12-03 DIAGNOSIS — N2581 Secondary hyperparathyroidism of renal origin: Secondary | ICD-10-CM | POA: Diagnosis not present

## 2019-12-03 DIAGNOSIS — D509 Iron deficiency anemia, unspecified: Secondary | ICD-10-CM | POA: Diagnosis not present

## 2019-12-04 DIAGNOSIS — N186 End stage renal disease: Secondary | ICD-10-CM | POA: Diagnosis not present

## 2019-12-04 DIAGNOSIS — D509 Iron deficiency anemia, unspecified: Secondary | ICD-10-CM | POA: Diagnosis not present

## 2019-12-04 DIAGNOSIS — N2581 Secondary hyperparathyroidism of renal origin: Secondary | ICD-10-CM | POA: Diagnosis not present

## 2019-12-04 DIAGNOSIS — D631 Anemia in chronic kidney disease: Secondary | ICD-10-CM | POA: Diagnosis not present

## 2019-12-04 DIAGNOSIS — Z992 Dependence on renal dialysis: Secondary | ICD-10-CM | POA: Diagnosis not present

## 2019-12-05 DIAGNOSIS — Z992 Dependence on renal dialysis: Secondary | ICD-10-CM | POA: Diagnosis not present

## 2019-12-05 DIAGNOSIS — N186 End stage renal disease: Secondary | ICD-10-CM | POA: Diagnosis not present

## 2019-12-05 DIAGNOSIS — D631 Anemia in chronic kidney disease: Secondary | ICD-10-CM | POA: Diagnosis not present

## 2019-12-05 DIAGNOSIS — D509 Iron deficiency anemia, unspecified: Secondary | ICD-10-CM | POA: Diagnosis not present

## 2019-12-05 DIAGNOSIS — N2581 Secondary hyperparathyroidism of renal origin: Secondary | ICD-10-CM | POA: Diagnosis not present

## 2019-12-06 DIAGNOSIS — N186 End stage renal disease: Secondary | ICD-10-CM | POA: Diagnosis not present

## 2019-12-06 DIAGNOSIS — Z992 Dependence on renal dialysis: Secondary | ICD-10-CM | POA: Diagnosis not present

## 2019-12-06 DIAGNOSIS — D631 Anemia in chronic kidney disease: Secondary | ICD-10-CM | POA: Diagnosis not present

## 2019-12-06 DIAGNOSIS — N2581 Secondary hyperparathyroidism of renal origin: Secondary | ICD-10-CM | POA: Diagnosis not present

## 2019-12-06 DIAGNOSIS — D509 Iron deficiency anemia, unspecified: Secondary | ICD-10-CM | POA: Diagnosis not present

## 2019-12-07 DIAGNOSIS — D509 Iron deficiency anemia, unspecified: Secondary | ICD-10-CM | POA: Diagnosis not present

## 2019-12-07 DIAGNOSIS — D631 Anemia in chronic kidney disease: Secondary | ICD-10-CM | POA: Diagnosis not present

## 2019-12-07 DIAGNOSIS — N2581 Secondary hyperparathyroidism of renal origin: Secondary | ICD-10-CM | POA: Diagnosis not present

## 2019-12-07 DIAGNOSIS — N186 End stage renal disease: Secondary | ICD-10-CM | POA: Diagnosis not present

## 2019-12-07 DIAGNOSIS — Z992 Dependence on renal dialysis: Secondary | ICD-10-CM | POA: Diagnosis not present

## 2019-12-08 DIAGNOSIS — D631 Anemia in chronic kidney disease: Secondary | ICD-10-CM | POA: Diagnosis not present

## 2019-12-08 DIAGNOSIS — Z992 Dependence on renal dialysis: Secondary | ICD-10-CM | POA: Diagnosis not present

## 2019-12-08 DIAGNOSIS — N186 End stage renal disease: Secondary | ICD-10-CM | POA: Diagnosis not present

## 2019-12-08 DIAGNOSIS — N2581 Secondary hyperparathyroidism of renal origin: Secondary | ICD-10-CM | POA: Diagnosis not present

## 2019-12-08 DIAGNOSIS — D509 Iron deficiency anemia, unspecified: Secondary | ICD-10-CM | POA: Diagnosis not present

## 2019-12-09 DIAGNOSIS — N2581 Secondary hyperparathyroidism of renal origin: Secondary | ICD-10-CM | POA: Diagnosis not present

## 2019-12-09 DIAGNOSIS — D509 Iron deficiency anemia, unspecified: Secondary | ICD-10-CM | POA: Diagnosis not present

## 2019-12-09 DIAGNOSIS — Z992 Dependence on renal dialysis: Secondary | ICD-10-CM | POA: Diagnosis not present

## 2019-12-09 DIAGNOSIS — D631 Anemia in chronic kidney disease: Secondary | ICD-10-CM | POA: Diagnosis not present

## 2019-12-09 DIAGNOSIS — N186 End stage renal disease: Secondary | ICD-10-CM | POA: Diagnosis not present

## 2019-12-10 DIAGNOSIS — D509 Iron deficiency anemia, unspecified: Secondary | ICD-10-CM | POA: Diagnosis not present

## 2019-12-10 DIAGNOSIS — N186 End stage renal disease: Secondary | ICD-10-CM | POA: Diagnosis not present

## 2019-12-10 DIAGNOSIS — N2581 Secondary hyperparathyroidism of renal origin: Secondary | ICD-10-CM | POA: Diagnosis not present

## 2019-12-10 DIAGNOSIS — D631 Anemia in chronic kidney disease: Secondary | ICD-10-CM | POA: Diagnosis not present

## 2019-12-10 DIAGNOSIS — Z992 Dependence on renal dialysis: Secondary | ICD-10-CM | POA: Diagnosis not present

## 2019-12-11 DIAGNOSIS — D509 Iron deficiency anemia, unspecified: Secondary | ICD-10-CM | POA: Diagnosis not present

## 2019-12-11 DIAGNOSIS — D631 Anemia in chronic kidney disease: Secondary | ICD-10-CM | POA: Diagnosis not present

## 2019-12-11 DIAGNOSIS — N186 End stage renal disease: Secondary | ICD-10-CM | POA: Diagnosis not present

## 2019-12-11 DIAGNOSIS — N2581 Secondary hyperparathyroidism of renal origin: Secondary | ICD-10-CM | POA: Diagnosis not present

## 2019-12-11 DIAGNOSIS — Z992 Dependence on renal dialysis: Secondary | ICD-10-CM | POA: Diagnosis not present

## 2019-12-12 ENCOUNTER — Ambulatory Visit (INDEPENDENT_AMBULATORY_CARE_PROVIDER_SITE_OTHER): Payer: Medicare Other | Admitting: Family Medicine

## 2019-12-12 ENCOUNTER — Ambulatory Visit: Payer: Medicare Other | Admitting: Family Medicine

## 2019-12-12 ENCOUNTER — Encounter: Payer: Self-pay | Admitting: Family Medicine

## 2019-12-12 ENCOUNTER — Other Ambulatory Visit: Payer: Self-pay

## 2019-12-12 VITALS — BP 120/72 | HR 76 | Temp 97.8°F | Wt 125.6 lb

## 2019-12-12 DIAGNOSIS — N185 Chronic kidney disease, stage 5: Secondary | ICD-10-CM

## 2019-12-12 DIAGNOSIS — E782 Mixed hyperlipidemia: Secondary | ICD-10-CM | POA: Diagnosis not present

## 2019-12-12 DIAGNOSIS — N2581 Secondary hyperparathyroidism of renal origin: Secondary | ICD-10-CM | POA: Diagnosis not present

## 2019-12-12 DIAGNOSIS — N186 End stage renal disease: Secondary | ICD-10-CM

## 2019-12-12 DIAGNOSIS — I129 Hypertensive chronic kidney disease with stage 1 through stage 4 chronic kidney disease, or unspecified chronic kidney disease: Secondary | ICD-10-CM

## 2019-12-12 DIAGNOSIS — I25709 Atherosclerosis of coronary artery bypass graft(s), unspecified, with unspecified angina pectoris: Secondary | ICD-10-CM

## 2019-12-12 DIAGNOSIS — D509 Iron deficiency anemia, unspecified: Secondary | ICD-10-CM | POA: Diagnosis not present

## 2019-12-12 DIAGNOSIS — N184 Chronic kidney disease, stage 4 (severe): Secondary | ICD-10-CM

## 2019-12-12 DIAGNOSIS — E1122 Type 2 diabetes mellitus with diabetic chronic kidney disease: Secondary | ICD-10-CM

## 2019-12-12 DIAGNOSIS — E7800 Pure hypercholesterolemia, unspecified: Secondary | ICD-10-CM | POA: Diagnosis not present

## 2019-12-12 DIAGNOSIS — D631 Anemia in chronic kidney disease: Secondary | ICD-10-CM | POA: Diagnosis not present

## 2019-12-12 DIAGNOSIS — Z992 Dependence on renal dialysis: Secondary | ICD-10-CM | POA: Diagnosis not present

## 2019-12-12 MED ORDER — SITAGLIPTIN PHOSPHATE 25 MG PO TABS
25.0000 mg | ORAL_TABLET | Freq: Every day | ORAL | 1 refills | Status: DC
Start: 2019-12-12 — End: 2020-06-25

## 2019-12-12 MED ORDER — ISOSORBIDE MONONITRATE ER 30 MG PO TB24
45.0000 mg | ORAL_TABLET | Freq: Every day | ORAL | 1 refills | Status: DC
Start: 2019-12-12 — End: 2020-06-25

## 2019-12-12 MED ORDER — NYSTATIN 100000 UNIT/GM EX POWD
1.0000 | Freq: Three times a day (TID) | CUTANEOUS | 0 refills | Status: DC
Start: 2019-12-12 — End: 2020-11-19

## 2019-12-12 MED ORDER — ATORVASTATIN CALCIUM 80 MG PO TABS: 80.0000 mg | ORAL_TABLET | Freq: Every day | ORAL | 1 refills | Status: DC

## 2019-12-12 MED ORDER — EZETIMIBE 10 MG PO TABS: 10.0000 mg | ORAL_TABLET | Freq: Every day | ORAL | 1 refills | Status: DC

## 2019-12-12 NOTE — Assessment & Plan Note (Signed)
Under good control on current regimen. Continue current regimen. Continue to monitor. Call with any concerns. Refills given. Labs drawn today.   

## 2019-12-12 NOTE — Progress Notes (Signed)
BP 120/72   Pulse 76   Temp 97.8 F (36.6 C) (Oral)   Wt 125 lb 9.6 oz (57 kg)   SpO2 99%   BMI 21.44 kg/m    Subjective:    Patient ID: Ray Lamb, male    DOB: 02/20/1930, 86 y.o.   MRN: [id number]  HPI: Ray Lamb is a 86 y.o. male  Chief Complaint  Patient presents with  . Diabetes  . Hyperlipidemia  . Hypertension  . Chronic Kidney Disease   HYPERTENSION / [place] Satisfied with current treatment? yes Duration of hypertension: chronic BP monitoring frequency: not checking BP medication side effects: no Past BP meds: imdur, lasix, carvedilol Duration of hyperlipidemia: chronic Cholesterol medication side effects: no Cholesterol supplements: none Past cholesterol medications: atorvastain (lipitor) and ezetimide (zetia) Medication compliance: excellent compliance Aspirin: no Recent stressors: no Recurrent headaches: no Visual changes: no Palpitations: no Dyspnea: no Chest pain: no Lower extremity edema: no Dizzy/lightheaded: no  DIABETES Hypoglycemic episodes:no Polydipsia/polyuria: no Visual disturbance: no Chest pain: no Paresthesias: no Glucose Monitoring: yes  Accucheck frequency: Daily  Fasting glucose: 149 Taking Insulin?: no Blood Pressure Monitoring: not checking Retinal Examination: Up to Date Foot Exam: Up to Date Diabetic Education: Not Completed Pneumovax: Up to Date Influenza: Up to Date Aspirin: yes   Relevant past medical, surgical, family and social history reviewed and updated as indicated. Interim medical history since our last visit reviewed. Allergies and medications reviewed and updated.  Review of Systems  Constitutional: Negative.   HENT: Negative.   Respiratory: Negative.   Cardiovascular: Negative.   Gastrointestinal: Negative.   Musculoskeletal: Negative.   Neurological: Negative.   Psychiatric/Behavioral: Negative.     Per HPI unless specifically indicated above     Objective:    BP 120/72    Pulse 76   Temp 97.8 F (36.6 C) (Oral)   Wt 125 lb 9.6 oz (57 kg)   SpO2 99%   BMI 21.44 kg/m   Wt Readings from Last 3 Encounters:  12/12/19 125 lb 9.6 oz (57 kg)  07/03/19 137 lb 3.2 oz (62.2 kg)  06/03/19 130 lb (59 kg)    Physical Exam Vitals and nursing note reviewed.  Constitutional:      General: He is not in acute distress.    Appearance: Normal appearance. He is not ill-appearing, toxic-appearing or diaphoretic.  HENT:     Head: Normocephalic and atraumatic.     Right Ear: External ear normal.     Left Ear: External ear normal.     Nose: Nose normal.     Mouth/Throat:     Mouth: Mucous membranes are moist.     Pharynx: Oropharynx is clear.  Eyes:     General: No scleral icterus.       Right eye: No discharge.        Left eye: No discharge.     Extraocular Movements: Extraocular movements intact.     Conjunctiva/sclera: Conjunctivae normal.     Pupils: Pupils are equal, round, and reactive to light.  Cardiovascular:     Rate and Rhythm: Normal rate and regular rhythm.     Pulses: Normal pulses.     Heart sounds: Normal heart sounds. No murmur heard.  No friction rub. No gallop.   Pulmonary:     Effort: Pulmonary effort is normal. No respiratory distress.     Breath sounds: Normal breath sounds. No stridor. No wheezing, rhonchi or rales.  Chest:     Chest wall:  No tenderness.  Musculoskeletal:        General: Normal range of motion.     Cervical back: Normal range of motion and neck supple.  Skin:    General: Skin is warm and dry.     Capillary Refill: Capillary refill takes less than 2 seconds.     Coloration: Skin is not jaundiced or pale.     Findings: No bruising, erythema, lesion or rash.  Neurological:     General: No focal deficit present.     Mental Status: He is alert and oriented to person, place, and time. Mental status is at baseline.  Psychiatric:        Mood and Affect: Mood normal.        Behavior: Behavior normal.        Thought Content:  Thought content normal.        Judgment: Judgment normal.     Results for orders placed or performed in visit on 12/12/19  CBC and differential  Result Value Ref Range   Hemoglobin 11.6 (A) 13.5 - 17.5  VITAMIN D 25 Hydroxy (Vit-D Deficiency, Fractures)  Result Value Ref Range   Vit D, 25-Hydroxy 35   Basic metabolic panel  Result Value Ref Range   Glucose 106    Potassium 3.8 3.4 - 5.3  Comprehensive metabolic panel  Result Value Ref Range   Calcium 9.5 8.7 - 10.7   Albumin 3.3 (A) 3.5 - 5.0  Lipid panel  Result Value Ref Range   Cholesterol 104 0 - 200  Hemoglobin A1c  Result Value Ref Range   Hemoglobin A1C 6.1       Assessment & Plan:   Problem List Items Addressed This Visit      Cardiovascular and Mediastinum   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 Continue current regimen. Continue to monitor. Call with any concerns. Refills given. Labs drawn today.       Relevant Medications   torsemide (DEMADEX) 20 MG tablet   atorvastatin (LIPITOR) 80 MG tablet   isosorbide mononitrate (IMDUR) 30 MG 24 hr tablet   ezetimibe (ZETIA) 10 MG tablet   Other Relevant Orders   CBC with Differential/Platelet   Comprehensive metabolic panel     Endocrine   Diabetes ([place]) - Primary    Doing well with A1c of 6.1. Continue current regimen. Continue to monitor. Call with any concerns.       Relevant Medications   atorvastatin (LIPITOR) 80 MG tablet   sitaGLIPtin (JANUVIA) 25 MG tablet   Other Relevant Orders   Comprehensive metabolic panel   Urinalysis, Routine w reflex microscopic     Genitourinary   ESRD (end stage renal disease) (HCC)    Stable. Continue to follow with nephrology. Call with any concerns. Continue to monitor.       Relevant Orders   Comprehensive metabolic panel   Urinalysis, Routine w reflex microscopic     Other   Mixed hyperlipidemia    Under good control on current  regimen. Continue current regimen. Continue to monitor. Call with any concerns. Refills given. Labs drawn today.       Relevant Medications   torsemide (DEMADEX) 20 MG tablet   atorvastatin (LIPITOR) 80 MG tablet   isosorbide mononitrate (IMDUR) 30 MG 24 hr tablet   ezetimibe (ZETIA) 10 MG tablet   Other Relevant Orders   Comprehensive metabolic panel   Lipid Panel w/o Chol/HDL  Ratio    Other Visit Diagnoses    Pure hypercholesterolemia       Relevant Medications   torsemide (DEMADEX) 20 MG tablet   atorvastatin (LIPITOR) 80 MG tablet   isosorbide mononitrate (IMDUR) 30 MG 24 hr tablet   ezetimibe (ZETIA) 10 MG tablet       Follow up plan: Return in about 3 months (around 03/13/2020).

## 2019-12-12 NOTE — Assessment & Plan Note (Signed)
Doing well with A1c of 6.1. Continue current regimen. Continue to monitor. Call with any concerns.  

## 2019-12-12 NOTE — Patient Instructions (Signed)
Shingrix

## 2019-12-12 NOTE — Assessment & Plan Note (Signed)
Stable. Continue to follow with nephrology. Call with any concerns. Continue to monitor.

## 2019-12-13 DIAGNOSIS — Z992 Dependence on renal dialysis: Secondary | ICD-10-CM | POA: Diagnosis not present

## 2019-12-13 DIAGNOSIS — N2581 Secondary hyperparathyroidism of renal origin: Secondary | ICD-10-CM | POA: Diagnosis not present

## 2019-12-13 DIAGNOSIS — N186 End stage renal disease: Secondary | ICD-10-CM | POA: Diagnosis not present

## 2019-12-13 DIAGNOSIS — D509 Iron deficiency anemia, unspecified: Secondary | ICD-10-CM | POA: Diagnosis not present

## 2019-12-13 DIAGNOSIS — D631 Anemia in chronic kidney disease: Secondary | ICD-10-CM | POA: Diagnosis not present

## 2019-12-13 LAB — LIPID PANEL W/O CHOL/HDL RATIO
Cholesterol, Total: 116 mg/dL (ref 100–199)
HDL: 35 mg/dL — ABNORMAL LOW (ref >39)
LDL Chol Calc (NIH): 61 mg/dL (ref 0–99)
Triglycerides: 110 mg/dL (ref 0–149)
VLDL Cholesterol Cal: 20 mg/dL (ref 5–40)

## 2019-12-13 LAB — CBC WITH DIFFERENTIAL/PLATELET
Basophils Absolute: 0.1 10*3/uL (ref 0.0–0.2)
Basos: 1 %
EOS (ABSOLUTE): 0.2 10*3/uL (ref 0.0–0.4)
Eos: 2 %
Hematocrit: 38.1 % (ref 37.5–51.0)
Hemoglobin: 12.5 g/dL — ABNORMAL LOW (ref 13.0–17.7)
Immature Grans (Abs): 0.1 10*3/uL (ref 0.0–0.1)
Immature Granulocytes: 1 %
Lymphocytes Absolute: 2.7 10*3/uL (ref 0.7–3.1)
Lymphs: 32 %
MCH: 32.2 pg (ref 26.6–33.0)
MCHC: 32.8 g/dL (ref 31.5–35.7)
MCV: 98 fL — ABNORMAL HIGH (ref 79–97)
Monocytes Absolute: 0.9 10*3/uL (ref 0.1–0.9)
Monocytes: 10 %
Neutrophils Absolute: 4.7 10*3/uL (ref 1.4–7.0)
Neutrophils: 54 %
Platelets: 175 10*3/uL (ref 150–450)
RBC: 3.88 x10E6/uL — ABNORMAL LOW (ref 4.14–5.80)
RDW: 12.8 % (ref 11.6–15.4)
WBC: 8.6 10*3/uL (ref 3.4–10.8)

## 2019-12-13 LAB — COMPREHENSIVE METABOLIC PANEL
ALT: 11 IU/L (ref 0–44)
AST: 18 IU/L (ref 0–40)
Albumin/Globulin Ratio: 0.6 — ABNORMAL LOW (ref 1.2–2.2)
Albumin: 3.1 g/dL — ABNORMAL LOW (ref 3.6–4.6)
Alkaline Phosphatase: 91 IU/L (ref 44–121)
BUN/Creatinine Ratio: 11 (ref 10–24)
BUN: 69 mg/dL — ABNORMAL HIGH (ref 8–27)
Bilirubin Total: 0.2 mg/dL (ref 0.0–1.2)
CO2: 25 mmol/L (ref 20–29)
Calcium: 8.1 mg/dL — ABNORMAL LOW (ref 8.6–10.2)
Chloride: 92 mmol/L — ABNORMAL LOW (ref 96–106)
Creatinine, Ser: 6.34 mg/dL — ABNORMAL HIGH (ref 0.76–1.27)
GFR calc Af Amer: 8 mL/min/{1.73_m2} — ABNORMAL LOW (ref >59)
GFR calc non Af Amer: 7 mL/min/{1.73_m2} — ABNORMAL LOW (ref >59)
Globulin, Total: 5 g/dL — ABNORMAL HIGH (ref 1.5–4.5)
Glucose: 129 mg/dL — ABNORMAL HIGH (ref 65–99)
Potassium: 3.8 mmol/L (ref 3.5–5.2)
Sodium: 137 mmol/L (ref 134–144)
Total Protein: 8.1 g/dL (ref 6.0–8.5)

## 2019-12-14 DIAGNOSIS — N2581 Secondary hyperparathyroidism of renal origin: Secondary | ICD-10-CM | POA: Diagnosis not present

## 2019-12-14 DIAGNOSIS — D631 Anemia in chronic kidney disease: Secondary | ICD-10-CM | POA: Diagnosis not present

## 2019-12-14 DIAGNOSIS — D509 Iron deficiency anemia, unspecified: Secondary | ICD-10-CM | POA: Diagnosis not present

## 2019-12-14 DIAGNOSIS — Z992 Dependence on renal dialysis: Secondary | ICD-10-CM | POA: Diagnosis not present

## 2019-12-14 DIAGNOSIS — N186 End stage renal disease: Secondary | ICD-10-CM | POA: Diagnosis not present

## 2019-12-15 DIAGNOSIS — D509 Iron deficiency anemia, unspecified: Secondary | ICD-10-CM | POA: Diagnosis not present

## 2019-12-15 DIAGNOSIS — Z992 Dependence on renal dialysis: Secondary | ICD-10-CM | POA: Diagnosis not present

## 2019-12-15 DIAGNOSIS — D631 Anemia in chronic kidney disease: Secondary | ICD-10-CM | POA: Diagnosis not present

## 2019-12-15 DIAGNOSIS — N186 End stage renal disease: Secondary | ICD-10-CM | POA: Diagnosis not present

## 2019-12-15 LAB — URINALYSIS, ROUTINE W REFLEX MICROSCOPIC
Bilirubin, UA: NEGATIVE
Glucose, UA: NEGATIVE
Ketones, UA: NEGATIVE
Nitrite, UA: NEGATIVE
Specific Gravity, UA: 1.015 (ref 1.005–1.030)
Urobilinogen, Ur: 0.2 mg/dL (ref 0.2–1.0)
pH, UA: 5 (ref 5.0–7.5)

## 2019-12-15 LAB — MICROSCOPIC EXAMINATION: Bacteria, UA: NONE SEEN

## 2019-12-16 DIAGNOSIS — D631 Anemia in chronic kidney disease: Secondary | ICD-10-CM | POA: Diagnosis not present

## 2019-12-16 DIAGNOSIS — N186 End stage renal disease: Secondary | ICD-10-CM | POA: Diagnosis not present

## 2019-12-16 DIAGNOSIS — D509 Iron deficiency anemia, unspecified: Secondary | ICD-10-CM | POA: Diagnosis not present

## 2019-12-16 DIAGNOSIS — Z992 Dependence on renal dialysis: Secondary | ICD-10-CM | POA: Diagnosis not present

## 2019-12-17 ENCOUNTER — Ambulatory Visit: Payer: Medicare Other | Admitting: Licensed Clinical Social Worker

## 2019-12-17 DIAGNOSIS — N186 End stage renal disease: Secondary | ICD-10-CM

## 2019-12-17 DIAGNOSIS — E7800 Pure hypercholesterolemia, unspecified: Secondary | ICD-10-CM

## 2019-12-17 DIAGNOSIS — I129 Hypertensive chronic kidney disease with stage 1 through stage 4 chronic kidney disease, or unspecified chronic kidney disease: Secondary | ICD-10-CM

## 2019-12-17 DIAGNOSIS — N184 Chronic kidney disease, stage 4 (severe): Secondary | ICD-10-CM

## 2019-12-17 DIAGNOSIS — E1122 Type 2 diabetes mellitus with diabetic chronic kidney disease: Secondary | ICD-10-CM

## 2019-12-17 DIAGNOSIS — Z992 Dependence on renal dialysis: Secondary | ICD-10-CM | POA: Diagnosis not present

## 2019-12-17 DIAGNOSIS — D509 Iron deficiency anemia, unspecified: Secondary | ICD-10-CM | POA: Diagnosis not present

## 2019-12-17 DIAGNOSIS — D631 Anemia in chronic kidney disease: Secondary | ICD-10-CM | POA: Diagnosis not present

## 2019-12-17 NOTE — Chronic Care Management (AMB) (Signed)
Chronic Care Management    Clinical Social Work Follow Up Note  12/17/2019 Name: Ray Lamb MRN: [id number] DOB: 12-31-1930  Ray Lamb is a 86 y.o. year old male who is a primary care patient of Valerie Roys, DO. The CCM team was consulted for assistance with Level of Care Concerns.   Review of patient status, including review of consultants reports, other relevant assessments, and collaboration with appropriate care team members and the patient's provider was performed as part of comprehensive patient evaluation and provision of chronic care management services.    SDOH (Social Determinants of Health) assessments performed: Yes    Outpatient Encounter Medications as of 12/17/2019  Medication Sig Note  . ASPIRIN 81 PO Take by mouth every other day.    Marland Kitchen atorvastatin (LIPITOR) 80 MG tablet Take 1 tablet (80 mg total) by mouth at bedtime.   . carvedilol (COREG) 12.5 MG tablet Take by mouth 2 (two) times daily.    . clopidogrel (PLAVIX) 75 MG tablet Take 1 tablet (75 mg total) by mouth daily.   Marland Kitchen ezetimibe (ZETIA) 10 MG tablet Take 1 tablet (10 mg total) by mouth daily.   . isosorbide mononitrate (IMDUR) 30 MG 24 hr tablet Take 1.5 tablets (45 mg total) by mouth daily.   . Lancets (ONETOUCH ULTRASOFT) lancets USE TO CHECK BLOOD SUGAR TWICE A DAY   . Melatonin 10 MG TABS Take 10 mg by mouth at bedtime.   . mirtazapine (REMERON) 7.5 MG tablet Take 7.5 mg by mouth as needed.  11/23/2019: Takes rarely for appetite stimualtion  . Multiple Vitamins-Minerals (CENTRUM SILVER ULTRA MENS PO) Take by mouth daily.   Marland Kitchen nystatin (MYCOSTATIN/NYSTOP) powder Apply 1 application topically 3 (three) times daily.   . ondansetron (ZOFRAN) 4 MG tablet Take 4 mg by mouth every 8 (eight) hours as needed.   . sitaGLIPtin (JANUVIA) 25 MG tablet Take 1 tablet (25 mg total) by mouth daily.   . timolol (TIMOPTIC) 0.5 % ophthalmic solution Place 1 drop into both eyes daily.    Marland Kitchen torsemide (DEMADEX) 20 MG tablet  Take 40 mg by mouth daily.   . TURMERIC PO Take 1,300 mg by mouth daily.     Facility-Administered Encounter Medications as of 12/17/2019  Medication  . sodium chloride flush (NS) 0.9 % injection 3 mL     Goals Addressed    .  SW- "I will benefit from follow up calls, education and support." (pt-stated)        CARE PLAN ENTRY (see longitudinal plan of care for additional care plan information)  Current Barriers:  . Level of care concerns . ADL IADL limitations . Social Isolation . Inability to perform ADL's independently . Inability to perform IADL's independently  Clinical Social Work Clinical Goal(s):   Marland Kitchen Over the next 120 days, patient/caregiver will work with SW to address concerns related to care coordination needs and lack of education/support/resource connection. LCSW will assist patient in gaining community resource education and additional support and resource connection as well in order to maintain health and mental health appropriately  . Over the next 120 days, patient will demonstrate improved adherence to self care as evidenced by implementing healthy self-care into his daily routine such as: attending all medical appointments, deep breathing exercises, taking time for self-reflection, taking medications as prescribed, drinking water and daily exercise to improve mobility and mood.  . Over the next 120 days, patient will demonstrate improved health management independence as evidenced by implementing healthy  self-care skills and positive support/resources into his daily routine to help cope with stressors and improve overall health and well-being  . Over the next 120 days, patient or caregiver will verbalize basic understanding of depression/stress process and self health management plan as evidenced by her participation in development of long term plan of care and institution of self health management strategies  Interventions: . Inter-disciplinary care team collaboration  (see longitudinal plan of care) . Patient interviewed and appropriate assessments performed . Patient was able to travel to the beach with his spouse, son and daughter in law in the summer which provided him activity and socialization.  . Patient lives with spouse in their home and they both assist one another with various needs.Patient reports that he is receiving at home dialysis treatment. Patient was recently set up with a nutritionist through his dialysis physician. He reports that this has helped to improve his eating habits, mobility and strength.  . Provided mental health counseling with regard to acceptance. Patient is having a difficulty time adjusting to his new way of life and that he is unable to do what he once could, due to health reasons. Extensive emotional support provided to patient. Patient confirms having a very strong support network that he can lean on whenever he is struggling with this specific concern.  . Provided patient with information about available in home support resources, day programs and senior resources/senior centers. Patient denies needing these resources at this time. Patient denies needing HH referral at this time. Patient and spouse will hire in home aide to assist with house work and light cleaning if needed. Patient's son and grandson live locally and are able to provide assistance when needed. Patient's grandson is the primary caretaker but their son is his secondary back-up as he lives further away in [place].  . Patient and spouse deny any recent falls  . LCSW provided patient with [employer]'s number as his spouse has an upcoming appointment at [facility] and they will need a stable transportation to and from this.  . Discussed plans with patient for ongoing care management follow up and provided patient with direct contact information for care management team . Advised patient to contact CCM program or CFP front desk for any case management needs that arise.   . Assisted patient/caregiver with obtaining information about health plan benefits . Provided education and assistance to client regarding Advanced Directives. . Provided education to patient/caregiver regarding level of care options. . Provided education to patient/caregiver about Hospice and/or Palliative Care services  Patient Self Care Activities:  . Patient verbalizes understanding of plan to contact CFP or CCM team with any case management needs or concerns  . Attends all scheduled provider appointments . Calls provider office for new concerns or questions . Lacks social connections . Unable to perform ADLs independently . Unable to perform IADLs independently  Please see past updates related to this goal by clicking on the "Past Updates" button in the selected goal       Follow Up Plan: SW will follow up with patient by phone over the next quarter  Eula Fried, [place], MSW, [email or web address] Phone: [phone]

## 2019-12-18 DIAGNOSIS — D631 Anemia in chronic kidney disease: Secondary | ICD-10-CM | POA: Diagnosis not present

## 2019-12-18 DIAGNOSIS — D509 Iron deficiency anemia, unspecified: Secondary | ICD-10-CM | POA: Diagnosis not present

## 2019-12-18 DIAGNOSIS — Z992 Dependence on renal dialysis: Secondary | ICD-10-CM | POA: Diagnosis not present

## 2019-12-18 DIAGNOSIS — N186 End stage renal disease: Secondary | ICD-10-CM | POA: Diagnosis not present

## 2019-12-19 DIAGNOSIS — N186 End stage renal disease: Secondary | ICD-10-CM | POA: Diagnosis not present

## 2019-12-19 DIAGNOSIS — Z992 Dependence on renal dialysis: Secondary | ICD-10-CM | POA: Diagnosis not present

## 2019-12-19 DIAGNOSIS — D509 Iron deficiency anemia, unspecified: Secondary | ICD-10-CM | POA: Diagnosis not present

## 2019-12-19 DIAGNOSIS — D631 Anemia in chronic kidney disease: Secondary | ICD-10-CM | POA: Diagnosis not present

## 2019-12-20 DIAGNOSIS — Z992 Dependence on renal dialysis: Secondary | ICD-10-CM | POA: Diagnosis not present

## 2019-12-20 DIAGNOSIS — D509 Iron deficiency anemia, unspecified: Secondary | ICD-10-CM | POA: Diagnosis not present

## 2019-12-20 DIAGNOSIS — N186 End stage renal disease: Secondary | ICD-10-CM | POA: Diagnosis not present

## 2019-12-20 DIAGNOSIS — D631 Anemia in chronic kidney disease: Secondary | ICD-10-CM | POA: Diagnosis not present

## 2019-12-21 DIAGNOSIS — N186 End stage renal disease: Secondary | ICD-10-CM | POA: Diagnosis not present

## 2019-12-21 DIAGNOSIS — Z992 Dependence on renal dialysis: Secondary | ICD-10-CM | POA: Diagnosis not present

## 2019-12-21 DIAGNOSIS — D631 Anemia in chronic kidney disease: Secondary | ICD-10-CM | POA: Diagnosis not present

## 2019-12-21 DIAGNOSIS — D509 Iron deficiency anemia, unspecified: Secondary | ICD-10-CM | POA: Diagnosis not present

## 2019-12-22 DIAGNOSIS — D631 Anemia in chronic kidney disease: Secondary | ICD-10-CM | POA: Diagnosis not present

## 2019-12-22 DIAGNOSIS — D509 Iron deficiency anemia, unspecified: Secondary | ICD-10-CM | POA: Diagnosis not present

## 2019-12-22 DIAGNOSIS — N186 End stage renal disease: Secondary | ICD-10-CM | POA: Diagnosis not present

## 2019-12-22 DIAGNOSIS — Z992 Dependence on renal dialysis: Secondary | ICD-10-CM | POA: Diagnosis not present

## 2019-12-23 DIAGNOSIS — Z992 Dependence on renal dialysis: Secondary | ICD-10-CM | POA: Diagnosis not present

## 2019-12-23 DIAGNOSIS — D631 Anemia in chronic kidney disease: Secondary | ICD-10-CM | POA: Diagnosis not present

## 2019-12-23 DIAGNOSIS — N186 End stage renal disease: Secondary | ICD-10-CM | POA: Diagnosis not present

## 2019-12-23 DIAGNOSIS — D509 Iron deficiency anemia, unspecified: Secondary | ICD-10-CM | POA: Diagnosis not present

## 2019-12-24 DIAGNOSIS — Z992 Dependence on renal dialysis: Secondary | ICD-10-CM | POA: Diagnosis not present

## 2019-12-24 DIAGNOSIS — D631 Anemia in chronic kidney disease: Secondary | ICD-10-CM | POA: Diagnosis not present

## 2019-12-24 DIAGNOSIS — D509 Iron deficiency anemia, unspecified: Secondary | ICD-10-CM | POA: Diagnosis not present

## 2019-12-24 DIAGNOSIS — N186 End stage renal disease: Secondary | ICD-10-CM | POA: Diagnosis not present

## 2019-12-25 DIAGNOSIS — D631 Anemia in chronic kidney disease: Secondary | ICD-10-CM | POA: Diagnosis not present

## 2019-12-25 DIAGNOSIS — Z992 Dependence on renal dialysis: Secondary | ICD-10-CM | POA: Diagnosis not present

## 2019-12-25 DIAGNOSIS — D509 Iron deficiency anemia, unspecified: Secondary | ICD-10-CM | POA: Diagnosis not present

## 2019-12-25 DIAGNOSIS — N186 End stage renal disease: Secondary | ICD-10-CM | POA: Diagnosis not present

## 2019-12-26 DIAGNOSIS — Z992 Dependence on renal dialysis: Secondary | ICD-10-CM | POA: Diagnosis not present

## 2019-12-26 DIAGNOSIS — D631 Anemia in chronic kidney disease: Secondary | ICD-10-CM | POA: Diagnosis not present

## 2019-12-26 DIAGNOSIS — D509 Iron deficiency anemia, unspecified: Secondary | ICD-10-CM | POA: Diagnosis not present

## 2019-12-26 DIAGNOSIS — N186 End stage renal disease: Secondary | ICD-10-CM | POA: Diagnosis not present

## 2019-12-27 DIAGNOSIS — N186 End stage renal disease: Secondary | ICD-10-CM | POA: Diagnosis not present

## 2019-12-27 DIAGNOSIS — Z992 Dependence on renal dialysis: Secondary | ICD-10-CM | POA: Diagnosis not present

## 2019-12-27 DIAGNOSIS — D509 Iron deficiency anemia, unspecified: Secondary | ICD-10-CM | POA: Diagnosis not present

## 2019-12-27 DIAGNOSIS — D631 Anemia in chronic kidney disease: Secondary | ICD-10-CM | POA: Diagnosis not present

## 2019-12-28 DIAGNOSIS — Z992 Dependence on renal dialysis: Secondary | ICD-10-CM | POA: Diagnosis not present

## 2019-12-28 DIAGNOSIS — D631 Anemia in chronic kidney disease: Secondary | ICD-10-CM | POA: Diagnosis not present

## 2019-12-28 DIAGNOSIS — D509 Iron deficiency anemia, unspecified: Secondary | ICD-10-CM | POA: Diagnosis not present

## 2019-12-28 DIAGNOSIS — N186 End stage renal disease: Secondary | ICD-10-CM | POA: Diagnosis not present

## 2019-12-29 DIAGNOSIS — D631 Anemia in chronic kidney disease: Secondary | ICD-10-CM | POA: Diagnosis not present

## 2019-12-29 DIAGNOSIS — N186 End stage renal disease: Secondary | ICD-10-CM | POA: Diagnosis not present

## 2019-12-29 DIAGNOSIS — D509 Iron deficiency anemia, unspecified: Secondary | ICD-10-CM | POA: Diagnosis not present

## 2019-12-29 DIAGNOSIS — Z992 Dependence on renal dialysis: Secondary | ICD-10-CM | POA: Diagnosis not present

## 2019-12-30 DIAGNOSIS — D509 Iron deficiency anemia, unspecified: Secondary | ICD-10-CM | POA: Diagnosis not present

## 2019-12-30 DIAGNOSIS — N186 End stage renal disease: Secondary | ICD-10-CM | POA: Diagnosis not present

## 2019-12-30 DIAGNOSIS — Z992 Dependence on renal dialysis: Secondary | ICD-10-CM | POA: Diagnosis not present

## 2019-12-30 DIAGNOSIS — D631 Anemia in chronic kidney disease: Secondary | ICD-10-CM | POA: Diagnosis not present

## 2019-12-31 DIAGNOSIS — D631 Anemia in chronic kidney disease: Secondary | ICD-10-CM | POA: Diagnosis not present

## 2019-12-31 DIAGNOSIS — D509 Iron deficiency anemia, unspecified: Secondary | ICD-10-CM | POA: Diagnosis not present

## 2019-12-31 DIAGNOSIS — N186 End stage renal disease: Secondary | ICD-10-CM | POA: Diagnosis not present

## 2019-12-31 DIAGNOSIS — Z992 Dependence on renal dialysis: Secondary | ICD-10-CM | POA: Diagnosis not present

## 2020-01-01 DIAGNOSIS — Z992 Dependence on renal dialysis: Secondary | ICD-10-CM | POA: Diagnosis not present

## 2020-01-01 DIAGNOSIS — D509 Iron deficiency anemia, unspecified: Secondary | ICD-10-CM | POA: Diagnosis not present

## 2020-01-01 DIAGNOSIS — D631 Anemia in chronic kidney disease: Secondary | ICD-10-CM | POA: Diagnosis not present

## 2020-01-01 DIAGNOSIS — N186 End stage renal disease: Secondary | ICD-10-CM | POA: Diagnosis not present

## 2020-01-02 DIAGNOSIS — N186 End stage renal disease: Secondary | ICD-10-CM | POA: Diagnosis not present

## 2020-01-02 DIAGNOSIS — D509 Iron deficiency anemia, unspecified: Secondary | ICD-10-CM | POA: Diagnosis not present

## 2020-01-02 DIAGNOSIS — D631 Anemia in chronic kidney disease: Secondary | ICD-10-CM | POA: Diagnosis not present

## 2020-01-02 DIAGNOSIS — Z992 Dependence on renal dialysis: Secondary | ICD-10-CM | POA: Diagnosis not present

## 2020-01-03 DIAGNOSIS — D509 Iron deficiency anemia, unspecified: Secondary | ICD-10-CM | POA: Diagnosis not present

## 2020-01-03 DIAGNOSIS — Z992 Dependence on renal dialysis: Secondary | ICD-10-CM | POA: Diagnosis not present

## 2020-01-03 DIAGNOSIS — D631 Anemia in chronic kidney disease: Secondary | ICD-10-CM | POA: Diagnosis not present

## 2020-01-03 DIAGNOSIS — N186 End stage renal disease: Secondary | ICD-10-CM | POA: Diagnosis not present

## 2020-01-04 DIAGNOSIS — D631 Anemia in chronic kidney disease: Secondary | ICD-10-CM | POA: Diagnosis not present

## 2020-01-04 DIAGNOSIS — D509 Iron deficiency anemia, unspecified: Secondary | ICD-10-CM | POA: Diagnosis not present

## 2020-01-04 DIAGNOSIS — N186 End stage renal disease: Secondary | ICD-10-CM | POA: Diagnosis not present

## 2020-01-04 DIAGNOSIS — Z992 Dependence on renal dialysis: Secondary | ICD-10-CM | POA: Diagnosis not present

## 2020-01-05 DIAGNOSIS — N186 End stage renal disease: Secondary | ICD-10-CM | POA: Diagnosis not present

## 2020-01-05 DIAGNOSIS — D631 Anemia in chronic kidney disease: Secondary | ICD-10-CM | POA: Diagnosis not present

## 2020-01-05 DIAGNOSIS — D509 Iron deficiency anemia, unspecified: Secondary | ICD-10-CM | POA: Diagnosis not present

## 2020-01-05 DIAGNOSIS — Z992 Dependence on renal dialysis: Secondary | ICD-10-CM | POA: Diagnosis not present

## 2020-01-06 DIAGNOSIS — Z992 Dependence on renal dialysis: Secondary | ICD-10-CM | POA: Diagnosis not present

## 2020-01-06 DIAGNOSIS — D509 Iron deficiency anemia, unspecified: Secondary | ICD-10-CM | POA: Diagnosis not present

## 2020-01-06 DIAGNOSIS — D631 Anemia in chronic kidney disease: Secondary | ICD-10-CM | POA: Diagnosis not present

## 2020-01-06 DIAGNOSIS — N186 End stage renal disease: Secondary | ICD-10-CM | POA: Diagnosis not present

## 2020-01-07 DIAGNOSIS — D631 Anemia in chronic kidney disease: Secondary | ICD-10-CM | POA: Diagnosis not present

## 2020-01-07 DIAGNOSIS — N186 End stage renal disease: Secondary | ICD-10-CM | POA: Diagnosis not present

## 2020-01-07 DIAGNOSIS — Z992 Dependence on renal dialysis: Secondary | ICD-10-CM | POA: Diagnosis not present

## 2020-01-07 DIAGNOSIS — D509 Iron deficiency anemia, unspecified: Secondary | ICD-10-CM | POA: Diagnosis not present

## 2020-01-08 DIAGNOSIS — N186 End stage renal disease: Secondary | ICD-10-CM | POA: Diagnosis not present

## 2020-01-08 DIAGNOSIS — D631 Anemia in chronic kidney disease: Secondary | ICD-10-CM | POA: Diagnosis not present

## 2020-01-08 DIAGNOSIS — Z992 Dependence on renal dialysis: Secondary | ICD-10-CM | POA: Diagnosis not present

## 2020-01-08 DIAGNOSIS — D509 Iron deficiency anemia, unspecified: Secondary | ICD-10-CM | POA: Diagnosis not present

## 2020-01-09 DIAGNOSIS — N186 End stage renal disease: Secondary | ICD-10-CM | POA: Diagnosis not present

## 2020-01-09 DIAGNOSIS — Z992 Dependence on renal dialysis: Secondary | ICD-10-CM | POA: Diagnosis not present

## 2020-01-09 DIAGNOSIS — D631 Anemia in chronic kidney disease: Secondary | ICD-10-CM | POA: Diagnosis not present

## 2020-01-09 DIAGNOSIS — D509 Iron deficiency anemia, unspecified: Secondary | ICD-10-CM | POA: Diagnosis not present

## 2020-01-10 DIAGNOSIS — D509 Iron deficiency anemia, unspecified: Secondary | ICD-10-CM | POA: Diagnosis not present

## 2020-01-10 DIAGNOSIS — Z992 Dependence on renal dialysis: Secondary | ICD-10-CM | POA: Diagnosis not present

## 2020-01-10 DIAGNOSIS — N186 End stage renal disease: Secondary | ICD-10-CM | POA: Diagnosis not present

## 2020-01-10 DIAGNOSIS — D631 Anemia in chronic kidney disease: Secondary | ICD-10-CM | POA: Diagnosis not present

## 2020-01-11 DIAGNOSIS — D509 Iron deficiency anemia, unspecified: Secondary | ICD-10-CM | POA: Diagnosis not present

## 2020-01-11 DIAGNOSIS — D631 Anemia in chronic kidney disease: Secondary | ICD-10-CM | POA: Diagnosis not present

## 2020-01-11 DIAGNOSIS — Z992 Dependence on renal dialysis: Secondary | ICD-10-CM | POA: Diagnosis not present

## 2020-01-11 DIAGNOSIS — N186 End stage renal disease: Secondary | ICD-10-CM | POA: Diagnosis not present

## 2020-01-12 ENCOUNTER — Telehealth: Payer: Self-pay

## 2020-01-12 DIAGNOSIS — Z992 Dependence on renal dialysis: Secondary | ICD-10-CM | POA: Diagnosis not present

## 2020-01-12 DIAGNOSIS — N186 End stage renal disease: Secondary | ICD-10-CM | POA: Diagnosis not present

## 2020-01-12 DIAGNOSIS — D631 Anemia in chronic kidney disease: Secondary | ICD-10-CM | POA: Diagnosis not present

## 2020-01-12 DIAGNOSIS — D509 Iron deficiency anemia, unspecified: Secondary | ICD-10-CM | POA: Diagnosis not present

## 2020-01-12 DIAGNOSIS — E7800 Pure hypercholesterolemia, unspecified: Secondary | ICD-10-CM

## 2020-01-12 NOTE — Telephone Encounter (Signed)
Patient would like this to go to [place]

## 2020-01-13 DIAGNOSIS — N186 End stage renal disease: Secondary | ICD-10-CM | POA: Diagnosis not present

## 2020-01-13 DIAGNOSIS — D631 Anemia in chronic kidney disease: Secondary | ICD-10-CM | POA: Diagnosis not present

## 2020-01-13 DIAGNOSIS — D509 Iron deficiency anemia, unspecified: Secondary | ICD-10-CM | POA: Diagnosis not present

## 2020-01-13 DIAGNOSIS — Z992 Dependence on renal dialysis: Secondary | ICD-10-CM | POA: Diagnosis not present

## 2020-01-13 MED ORDER — ATORVASTATIN CALCIUM 80 MG PO TABS: 80.0000 mg | ORAL_TABLET | Freq: Every day | ORAL | 1 refills | Status: DC

## 2020-01-14 DIAGNOSIS — N186 End stage renal disease: Secondary | ICD-10-CM | POA: Diagnosis not present

## 2020-01-14 DIAGNOSIS — D509 Iron deficiency anemia, unspecified: Secondary | ICD-10-CM | POA: Diagnosis not present

## 2020-01-14 DIAGNOSIS — Z992 Dependence on renal dialysis: Secondary | ICD-10-CM | POA: Diagnosis not present

## 2020-01-15 DIAGNOSIS — N186 End stage renal disease: Secondary | ICD-10-CM | POA: Diagnosis not present

## 2020-01-15 DIAGNOSIS — Z992 Dependence on renal dialysis: Secondary | ICD-10-CM | POA: Diagnosis not present

## 2020-01-15 DIAGNOSIS — D509 Iron deficiency anemia, unspecified: Secondary | ICD-10-CM | POA: Diagnosis not present

## 2020-01-16 DIAGNOSIS — N186 End stage renal disease: Secondary | ICD-10-CM | POA: Diagnosis not present

## 2020-01-16 DIAGNOSIS — Z992 Dependence on renal dialysis: Secondary | ICD-10-CM | POA: Diagnosis not present

## 2020-01-16 DIAGNOSIS — D509 Iron deficiency anemia, unspecified: Secondary | ICD-10-CM | POA: Diagnosis not present

## 2020-01-17 DIAGNOSIS — D509 Iron deficiency anemia, unspecified: Secondary | ICD-10-CM | POA: Diagnosis not present

## 2020-01-17 DIAGNOSIS — Z992 Dependence on renal dialysis: Secondary | ICD-10-CM | POA: Diagnosis not present

## 2020-01-17 DIAGNOSIS — N186 End stage renal disease: Secondary | ICD-10-CM | POA: Diagnosis not present

## 2020-01-18 DIAGNOSIS — D509 Iron deficiency anemia, unspecified: Secondary | ICD-10-CM | POA: Diagnosis not present

## 2020-01-18 DIAGNOSIS — N186 End stage renal disease: Secondary | ICD-10-CM | POA: Diagnosis not present

## 2020-01-18 DIAGNOSIS — Z992 Dependence on renal dialysis: Secondary | ICD-10-CM | POA: Diagnosis not present

## 2020-01-18 NOTE — Chronic Care Management (AMB) (Signed)
Chronic Care Management Pharmacy  Name: CHUE BERKOVICH  MRN: [id number] DOB: 31-Dec-1930   Chief Complaint/ HPI  Ray Lamb,  86 y.o. , male presents for their Initial CCM visit with the clinical pharmacist In office.  PCP : Valerie Roys, DO Patient Care Team: Valerie Roys, DO as PCP - General (Family Medicine) Murlean Iba, MD (Internal Medicine) Dionisio David, MD as Consulting Physician (Cardiology) Sharlotte Alamo, [place] (Podiatry) Ronita Hipps, Elmer Ramp., MD (Dentistry) Lucilla Lame, MD as Consulting Physician (Gastroenterology) Vladimir Crofts, MD as Consulting Physician (Neurology) Minor, Dalbert Garnet, RN (Inactive) as [place] Management Sindy Guadeloupe, MD as Consulting Physician (Oncology) Vladimir Faster, [facility] as Pharmacist (Pharmacist)  Their chronic conditions include: Hypertension, Hyperlipidemia, Diabetes, Heart Failure, Coronary Artery Disease, Chronic Kidney Disease and Alzheimers dementia   Office Visits: 12/12/19-Dr. Wynetta Emery- blood work  Consult Visit: 09/01/19- Tobias Alexander, PA- Neurology - dementia vascular and alzheimers , donepezil and Namenda stopped, melatonin for sleep   Allergies  Allergen Reactions  . Bee Venom Hives  . Iodinated Diagnostic Agents Rash  . Metrizamide Rash  . Other Rash and Other (See Comments)    Dye    Medications: Outpatient Encounter Medications as of 01/19/2020  Medication Sig Note  . loratadine (CLARITIN) 10 MG tablet Take 10 mg by mouth daily.   . ASPIRIN 81 PO Take by mouth every other day.    Marland Kitchen atorvastatin (LIPITOR) 80 MG tablet Take 1 tablet (80 mg total) by mouth at bedtime.   . carvedilol (COREG) 12.5 MG tablet Take by mouth 2 (two) times daily.    . clopidogrel (PLAVIX) 75 MG tablet Take 1 tablet (75 mg total) by mouth daily.   Marland Kitchen ezetimibe (ZETIA) 10 MG tablet Take 1 tablet (10 mg total) by mouth daily.   . isosorbide mononitrate (IMDUR) 30 MG 24 hr tablet Take 1.5 tablets (45 mg total) by mouth  daily.   . Lancets (ONETOUCH ULTRASOFT) lancets USE TO CHECK BLOOD SUGAR TWICE A DAY   . Melatonin 10 MG TABS Take 10 mg by mouth at bedtime.   . mirtazapine (REMERON) 7.5 MG tablet Take 7.5 mg by mouth as needed.  11/23/2019: Takes rarely for appetite stimualtion  . Multiple Vitamins-Minerals (CENTRUM SILVER ULTRA MENS PO) Take by mouth daily.   Marland Kitchen nystatin (MYCOSTATIN/NYSTOP) powder Apply 1 application topically 3 (three) times daily.   . ondansetron (ZOFRAN) 4 MG tablet Take 4 mg by mouth every 8 (eight) hours as needed.   . sitaGLIPtin (JANUVIA) 25 MG tablet Take 1 tablet (25 mg total) by mouth daily.   . timolol (TIMOPTIC) 0.5 % ophthalmic solution Place 1 drop into both eyes daily.    Marland Kitchen torsemide (DEMADEX) 20 MG tablet Take 40 mg by mouth daily.   . TURMERIC PO Take 1,300 mg by mouth daily.     Facility-Administered Encounter Medications as of 01/19/2020  Medication  . sodium chloride flush (NS) 0.9 % injection 3 mL    Wt Readings from Last 3 Encounters:  12/12/19 125 lb 9.6 oz (57 kg)  07/03/19 137 lb 3.2 oz (62.2 kg)  06/03/19 130 lb (59 kg)    Current Diagnosis/Assessment:    Goals Addressed            This Visit's Progress   . Pharmacy Care Plan       CARE PLAN ENTRY (see longitudinal plan of care for additional care plan information)  Current Barriers:  . Chronic Disease  Management support, education, and care coordination needs related to Hypertension, Hyperlipidemia, Diabetes, Heart Failure, Coronary Artery Disease, Chronic Kidney Disease, and Alzheimers dementia   Hypertension/ESRD/CHF BP Readings from Last 3 Encounters:  12/12/19 120/72  07/16/19 137/72  07/03/19 (!) 151/69   . Pharmacist Clinical Goal(s): o Over the next 60  days, patient will work with PharmD and providers to maintain BP goal <140/90 . Current regimen:  . Carvedilol 12.23m bid . Torsemide 20 mg  . Isosorbide mononitrate ER 45 mg daily . Interventions: . Comprehensive medication  review performed, medication list updated in electronic medical record . Inter-disciplinary care team collaboration (see longitudinal plan of care)  . Patient self care activities - Over the next 30 days, patient will: o Check BP daily, document, and provide at future appointments o Ensure daily salt intake < 2300 mg/day  Hyperlipidemia/ CAD/ Previous CVA Lab Results  Component Value Date/Time   LDLCALC 61 12/12/2019 03:11 PM   . Pharmacist Clinical Goal(s): o Over the next 60 days, patient will work with PharmD and providers to maintain LDL goal < 70 . Current regimen:  . Atorvastatin 80 mg daily . Aspirin 81 mg qod . ezetemibe 10 mg qd . Clopidogrel 75 mg qd . Interventions: . Comprehensive medication review performed, medication list updated in electronic medical record . Inter-disciplinary care team collaboration (see longitudinal plan of care)  . Patient self care activities - Over the next 60 days, patient will: o Take medications as prescribed  Diabetes Lab Results  Component Value Date/Time   HGBA1C 6.1 11/20/2019 12:00 AM   HGBA1C 9.1 (H) 07/03/2019 11:42 AM   HGBA1C 5.6 04/04/2019 10:40 AM   HGBA1C 7.2 10/06/2015 12:00 AM   . Pharmacist Clinical Goal(s): o Over the next 60 days, patient will work with PharmD and providers to achieve A1c goal <8% . Current regimen:  o Januvia 25 mg daily . Interventions: o Reviewed goal glucose readings for an A1c of <7%, we want to see fasting sugars <130 and 2 hour after meal sugars <180.  o Reviewed signs/symptoms of hypoglycemia . Patient self care activities - Over the next 60 days, patient will: o Check blood sugar once daily and 3-4 times weekly, document, and provide at future appointments o Contact provider with any episodes of hypoglycemia    Medication management . Pharmacist Clinical Goal(s): o Over the next 60 days, patient will work with PharmD and providers to achieve optimal medication adherence . [facility] . Interventions o Comprehensive medication review performed. o Continue current medication management strategy. . Patient self care activities - Over the next 60 days, patient will: o Focus on medication adherence by using pill box. o Take medications as prescribed o Report any questions or concerns to PharmD and/or provider(s)  Please see past updates related to this goal by clicking on the "Past Updates" button in the selected goal         Diabetes   A1c goal <8%  Recent Relevant Labs: Lab Results  Component Value Date/Time   HGBA1C 6.1 11/20/2019 12:00 AM   HGBA1C 9.1 (H) 07/03/2019 11:42 AM   HGBA1C 5.6 04/04/2019 10:40 AM   HGBA1C 7.2 10/06/2015 12:00 AM   MICROALBUR 150 (H) 07/03/2019 11:42 AM   MICROALBUR 150 (H) 01/02/2019 11:26 AM    Last diabetic Eye exam:  Lab Results  Component Value Date/Time   HMDIABEYEEXA No Retinopathy 04/02/2019 12:00 AM    Last diabetic Foot exam: No results found for: HMDIABFOOTEX  Checking BG: "periodically"  Recent FBG Readings: 120-130 usually, with occassional  Reading 148, 150  Patient has failed these meds in past: glipizide Patient is currently controlled on the following medications: . Januvia 25 mg daily   We discussed: Reviewed goal glucose readings for an A1c of <7%, we want to see fasting sugars <130 and 2 hour after meal sugars <180. Mr. Llera states he has not been checking every day but does check several times per week. He states he feels well and denies any episodes of hypoglycemia.     Plan  Continue current medication  Hypertension/ ESRD/ CHF  On home peritoneal dialysis nightly BP goal is:  <140/90  Office blood pressures are  BP Readings from Last 3 Encounters:  12/12/19 120/72  07/16/19 137/72  07/03/19 (!) 151/69   Patient checks BP at home daily Patient home BP readings are ranging: 119-130/50s  Patient has failed these meds in the past: NA Patient is currently controlled on the  following medications:  . Carvedilol 12.65m bid . Torsemide 40 mg qd . Isosorbide mononitrate ER 45 mg daily   We discussed patient is on nightly peritoneal dialysis at home. Follows up at dialysis center weekly. Documents daily BP readings. Patient is managing his own medications, Glucometer and BP checks. Furosemide was changed to torsemide by nephrology.  Plan  Continue current medications.      Hyperlipidemia/ CAD/h/o CVA   LDL goal < 70  Lipid Panel     Component Value Date/Time   CHOL 116 12/12/2019 1511   CHOL 103 09/27/2018 0825   TRIG 110 12/12/2019 1511   TRIG 51 09/27/2018 0825   HDL 35 (L) 12/12/2019 1511   LDLCALC 61 12/12/2019 1511    Hepatic Function Latest Ref Rng & Units 12/12/2019 11/20/2019 07/03/2019  Total Protein 6.0 - 8.5 g/dL 8.1 - 7.6  Albumin 3.6 - 4.6 g/dL 3.1(L) 3.3(A) 3.2(L)  AST 0 - 40 IU/L 18 - 30  ALT 0 - 44 IU/L 11 - 22  Alk Phosphatase 44 - 121 IU/L 91 - 94  Total Bilirubin 0.0 - 1.2 mg/dL 0.2 - 0.3     The ASCVD Risk score ([place], et al., 2013) failed to calculate for the following reasons:   The 2013 ASCVD risk score is only valid for ages 414to 761  The patient has a prior MI or stroke diagnosis   Patient has failed these meds in past: NA Patient is currently controlled on the following medications:  . Atorvastatin 80 mg daily . Aspirin 81 mg qod . ezetemibe 10 mg qd . Clopidogrel 75 mg qd  We discussed:  Diet and exercise. Reviewed signs and symptoms of bleeding. Patient denies  Plan  Continue current medications  Health Maintenance   Patient is currently taking on the following medications:  . Timolol 0.5 % ophthalmic drops . Melatonin 10 mg qhs . Centrum silver qd  We discussed:  Patient states he is still having trouble sleeping through the night. He goes to bed around 10 pm usually awakens around 1-2 am and will take melatonin which allows him to rest throughout the remainder of the night. He is interested in a  supplement "Relaxium" and questions whether this would be okay for him to take. In reviewing online it contains, magnesium, valerian,chamomile, ashwaganda, GABA and melatonin.  He does not regularly nap and keeps consistent bedtimes. He has not tried herbal tea before bed and will try this.  Plan  Continue current  medications  Vaccines   Reviewed and discussed patient's vaccination history.    Immunization History  Administered Date(s) Administered  . Fluad Quad(high Dose 65+) 11/27/2018  . Influenza, High Dose Seasonal PF 11/26/2015, 12/11/2016, 12/05/2017, 11/12/2019  . Influenza,inj,Quad PF,6+ Mos 12/03/2014  . Influenza-Unspecified 11/12/2013, 10/15/2018  . Moderna SARS-COVID-2 Vaccination 04/01/2019, 04/29/2019  . PPD Test 10/05/2017  . Pneumococcal Conjugate-13 09/03/2013  . Pneumococcal Polysaccharide-23 10/15/1995, 11/13/2000  . Pneumococcal-Unspecified 10/15/2018  . Td 08/08/2004, 10/06/2015  . Zoster 02/26/2006    Plan  Recommended patient receive Shingrix and COVID booster.   Medication Management   [facility] Uses pill box? Yes Pt endorses  compliance   Plan  Continue current medication management strategy.    Follow up: 2  month phone visit  Junita Push. Kenton Kingfisher PharmD, [facility] [phone]

## 2020-01-19 ENCOUNTER — Ambulatory Visit: Payer: Medicare Other | Admitting: Pharmacist

## 2020-01-19 DIAGNOSIS — I10 Essential (primary) hypertension: Secondary | ICD-10-CM

## 2020-01-19 DIAGNOSIS — Z992 Dependence on renal dialysis: Secondary | ICD-10-CM | POA: Diagnosis not present

## 2020-01-19 DIAGNOSIS — D509 Iron deficiency anemia, unspecified: Secondary | ICD-10-CM | POA: Diagnosis not present

## 2020-01-19 DIAGNOSIS — N186 End stage renal disease: Secondary | ICD-10-CM | POA: Diagnosis not present

## 2020-01-19 DIAGNOSIS — E1122 Type 2 diabetes mellitus with diabetic chronic kidney disease: Secondary | ICD-10-CM

## 2020-01-19 NOTE — Patient Instructions (Addendum)
Visit Information  It was a pleasure speaking with you today. Thank you for letting me be part of your clinical team. Please call with any questions or concerns.   Goals Addressed            This Visit's Progress   . Pharmacy Care Plan       CARE PLAN ENTRY (see longitudinal plan of care for additional care plan information)  Current Barriers:  . Chronic Disease Management support, education, and care coordination needs related to Hypertension, Hyperlipidemia, Diabetes, Heart Failure, Coronary Artery Disease, Chronic Kidney Disease, and Alzheimers dementia   Hypertension/ESRD/CHF BP Readings from Last 3 Encounters:  12/12/19 120/72  07/16/19 137/72  07/03/19 (!) 151/69   . Pharmacist Clinical Goal(s): o Over the next 60  days, patient will work with PharmD and providers to maintain BP goal <140/90 . Current regimen:  . Carvedilol 12.5mg  bid . Torsemide 20 mg  . Isosorbide mononitrate ER 45 mg daily . Interventions: . Comprehensive medication review performed, medication list updated in electronic medical record . Inter-disciplinary care team collaboration (see longitudinal plan of care)  . Patient self care activities - Over the next 30 days, patient will: o Check BP daily, document, and provide at future appointments o Ensure daily salt intake < 2300 mg/day  Hyperlipidemia/ CAD/ Previous CVA Lab Results  Component Value Date/Time   LDLCALC 61 12/12/2019 03:11 PM   . Pharmacist Clinical Goal(s): o Over the next 60 days, patient will work with PharmD and providers to maintain LDL goal < 70 . Current regimen:  . Atorvastatin 80 mg daily . Aspirin 81 mg qod . ezetemibe 10 mg qd . Clopidogrel 75 mg qd . Interventions: . Comprehensive medication review performed, medication list updated in electronic medical record . Inter-disciplinary care team collaboration (see longitudinal plan of care)  . Patient self care activities - Over the next 60 days, patient will: o Take  medications as prescribed  Diabetes Lab Results  Component Value Date/Time   HGBA1C 6.1 11/20/2019 12:00 AM   HGBA1C 9.1 (H) 07/03/2019 11:42 AM   HGBA1C 5.6 04/04/2019 10:40 AM   HGBA1C 7.2 10/06/2015 12:00 AM   . Pharmacist Clinical Goal(s): o Over the next 60 days, patient will work with PharmD and providers to achieve A1c goal <8% . Current regimen:  o Januvia 25 mg daily . Interventions: o Reviewed goal glucose readings for an A1c of <7%, we want to see fasting sugars <130 and 2 hour after meal sugars <180.  o Reviewed signs/symptoms of hypoglycemia . Patient self care activities - Over the next 60 days, patient will: o Check blood sugar once daily and 3-4 times weekly, document, and provide at future appointments o Contact provider with any episodes of hypoglycemia    Medication management . Pharmacist Clinical Goal(s): o Over the next 60 days, patient will work with PharmD and providers to achieve optimal medication adherence . [facility] . Interventions o Comprehensive medication review performed. o Continue current medication management strategy. . Patient self care activities - Over the next 60 days, patient will: o Focus on medication adherence by using pill box. o Take medications as prescribed o Report any questions or concerns to PharmD and/or provider(s)  Please see past updates related to this goal by clicking on the "Past Updates" button in the selected goal         The patient verbalized understanding of instructions, educational materials, and care plan provided today and agreed to receive a mailed  copy of patient instructions, educational materials, and care plan.   Telephone follow up appointment with pharmacy team member scheduled for: 2 months  Junita Push. Kenton Kingfisher PharmD, BCPS Clinical Pharmacist [phone]  Insomnia Insomnia is a sleep disorder that makes it difficult to fall asleep or stay asleep. Insomnia can cause fatigue, low  energy, difficulty concentrating, mood swings, and poor performance at work or school. There are three different ways to classify insomnia:  Difficulty falling asleep.  Difficulty staying asleep.  Waking up too early in the morning. Any type of insomnia can be long-term (chronic) or short-term (acute). Both are common. Short-term insomnia usually lasts for three months or less. Chronic insomnia occurs at least three times a week for longer than three months. What are the causes? Insomnia may be caused by another condition, situation, or substance, such as:  Anxiety.  Certain medicines.  Gastroesophageal reflux disease (GERD) or other gastrointestinal conditions.  Asthma or other breathing conditions.  Restless legs syndrome, sleep apnea, or other sleep disorders.  Chronic pain.  Menopause.  Stroke.  Abuse of alcohol, tobacco, or illegal drugs.  Mental health conditions, such as depression.  Caffeine.  Neurological disorders, such as Alzheimer's disease.  An overactive thyroid (hyperthyroidism). Sometimes, the cause of insomnia may not be known. What increases the risk? Risk factors for insomnia include:  Gender. Women are affected more often than men.  Age. Insomnia is more common as you get older.  Stress.  Lack of exercise.  Irregular work schedule or working night shifts.  Traveling between different time zones.  Certain medical and mental health conditions. What are the signs or symptoms? If you have insomnia, the main symptom is having trouble falling asleep or having trouble staying asleep. This may lead to other symptoms, such as:  Feeling fatigued or having low energy.  Feeling nervous about going to sleep.  Not feeling rested in the morning.  Having trouble concentrating.  Feeling irritable, anxious, or depressed. How is this diagnosed? This condition may be diagnosed based on:  Your symptoms and medical history. Your health care provider  may ask about: ? Your sleep habits. ? Any medical conditions you have. ? Your mental health.  A physical exam. How is this treated? Treatment for insomnia depends on the cause. Treatment may focus on treating an underlying condition that is causing insomnia. Treatment may also include:  Medicines to help you sleep.  Counseling or therapy.  Lifestyle adjustments to help you sleep better. Follow these instructions at home: Eating and drinking   Limit or avoid alcohol, caffeinated beverages, and cigarettes, especially close to bedtime. These can disrupt your sleep.  Do not eat a large meal or eat spicy foods right before bedtime. This can lead to digestive discomfort that can make it hard for you to sleep. Sleep habits   Keep a sleep diary to help you and your health care provider figure out what could be causing your insomnia. Write down: ? When you sleep. ? When you wake up during the night. ? How well you sleep. ? How rested you feel the next day. ? Any side effects of medicines you are taking. ? What you eat and drink.  Make your bedroom a dark, comfortable place where it is easy to fall asleep. ? Put up shades or blackout curtains to block light from outside. ? Use a white noise machine to block noise. ? Keep the temperature cool.  Limit screen use before bedtime. This includes: ? Watching TV. ? Using  your smartphone, tablet, or computer.  Stick to a routine that includes going to bed and waking up at the same times every day and night. This can help you fall asleep faster. Consider making a quiet activity, such as reading, part of your nighttime routine.  Try to avoid taking naps during the day so that you sleep better at night.  Get out of bed if you are still awake after 15 minutes of trying to sleep. Keep the lights down, but try reading or doing a quiet activity. When you feel sleepy, go back to bed. General instructions  Take over-the-counter and prescription  medicines only as told by your health care provider.  Exercise regularly, as told by your health care provider. Avoid exercise starting several hours before bedtime.  Use relaxation techniques to manage stress. Ask your health care provider to suggest some techniques that may work well for you. These may include: ? Breathing exercises. ? Routines to release muscle tension. ? Visualizing peaceful scenes.  Make sure that you drive carefully. Avoid driving if you feel very sleepy.  Keep all follow-up visits as told by your health care provider. This is important. Contact a health care provider if:  You are tired throughout the day.  You have trouble in your daily routine due to sleepiness.  You continue to have sleep problems, or your sleep problems get worse. Get help right away if:  You have serious thoughts about hurting yourself or someone else. If you ever feel like you may hurt yourself or others, or have thoughts about taking your own life, get help right away. You can go to your nearest emergency department or call:  Your local emergency services (911 in the [place]  A suicide crisis helpline, such as the [place] at 478-537-7540. This is open 24 hours a day. Summary  Insomnia is a sleep disorder that makes it difficult to fall asleep or stay asleep.  Insomnia can be long-term (chronic) or short-term (acute).  Treatment for insomnia depends on the cause. Treatment may focus on treating an underlying condition that is causing insomnia.  Keep a sleep diary to help you and your health care provider figure out what could be causing your insomnia. This information is not intended to replace advice given to you by your health care provider. Make sure you discuss any questions you have with your health care provider. Document Revised: 01/12/2017 Document Reviewed: 11/09/2016 Elsevier Patient Education  2020 [employer].

## 2020-01-20 DIAGNOSIS — Z992 Dependence on renal dialysis: Secondary | ICD-10-CM | POA: Diagnosis not present

## 2020-01-20 DIAGNOSIS — D509 Iron deficiency anemia, unspecified: Secondary | ICD-10-CM | POA: Diagnosis not present

## 2020-01-20 DIAGNOSIS — N186 End stage renal disease: Secondary | ICD-10-CM | POA: Diagnosis not present

## 2020-01-21 DIAGNOSIS — N186 End stage renal disease: Secondary | ICD-10-CM | POA: Diagnosis not present

## 2020-01-21 DIAGNOSIS — D509 Iron deficiency anemia, unspecified: Secondary | ICD-10-CM | POA: Diagnosis not present

## 2020-01-21 DIAGNOSIS — Z992 Dependence on renal dialysis: Secondary | ICD-10-CM | POA: Diagnosis not present

## 2020-01-22 DIAGNOSIS — D509 Iron deficiency anemia, unspecified: Secondary | ICD-10-CM | POA: Diagnosis not present

## 2020-01-22 DIAGNOSIS — N186 End stage renal disease: Secondary | ICD-10-CM | POA: Diagnosis not present

## 2020-01-22 DIAGNOSIS — Z992 Dependence on renal dialysis: Secondary | ICD-10-CM | POA: Diagnosis not present

## 2020-01-23 DIAGNOSIS — N186 End stage renal disease: Secondary | ICD-10-CM | POA: Diagnosis not present

## 2020-01-23 DIAGNOSIS — Z992 Dependence on renal dialysis: Secondary | ICD-10-CM | POA: Diagnosis not present

## 2020-01-23 DIAGNOSIS — D509 Iron deficiency anemia, unspecified: Secondary | ICD-10-CM | POA: Diagnosis not present

## 2020-01-23 NOTE — Telephone Encounter (Signed)
Medication called in 

## 2020-01-23 NOTE — Telephone Encounter (Signed)
Can this be sent to [facility]?

## 2020-01-23 NOTE — Telephone Encounter (Signed)
Pharmacy calling and is requesting to have this sent to them. Pt is requesting to have it sent through [facility]. Please advise.     [facility] [place] [place] [address] [place] [address]  Phone: [phone] Fax: [phone]  Hours: Not open 24 hours

## 2020-01-23 NOTE — Telephone Encounter (Signed)
OK to transfer 

## 2020-01-24 DIAGNOSIS — N186 End stage renal disease: Secondary | ICD-10-CM | POA: Diagnosis not present

## 2020-01-24 DIAGNOSIS — D509 Iron deficiency anemia, unspecified: Secondary | ICD-10-CM | POA: Diagnosis not present

## 2020-01-24 DIAGNOSIS — Z992 Dependence on renal dialysis: Secondary | ICD-10-CM | POA: Diagnosis not present

## 2020-01-25 DIAGNOSIS — Z992 Dependence on renal dialysis: Secondary | ICD-10-CM | POA: Diagnosis not present

## 2020-01-25 DIAGNOSIS — D509 Iron deficiency anemia, unspecified: Secondary | ICD-10-CM | POA: Diagnosis not present

## 2020-01-25 DIAGNOSIS — N186 End stage renal disease: Secondary | ICD-10-CM | POA: Diagnosis not present

## 2020-01-26 DIAGNOSIS — D509 Iron deficiency anemia, unspecified: Secondary | ICD-10-CM | POA: Diagnosis not present

## 2020-01-26 DIAGNOSIS — N186 End stage renal disease: Secondary | ICD-10-CM | POA: Diagnosis not present

## 2020-01-26 DIAGNOSIS — Z992 Dependence on renal dialysis: Secondary | ICD-10-CM | POA: Diagnosis not present

## 2020-01-27 DIAGNOSIS — N186 End stage renal disease: Secondary | ICD-10-CM | POA: Diagnosis not present

## 2020-01-27 DIAGNOSIS — Z992 Dependence on renal dialysis: Secondary | ICD-10-CM | POA: Diagnosis not present

## 2020-01-27 DIAGNOSIS — D509 Iron deficiency anemia, unspecified: Secondary | ICD-10-CM | POA: Diagnosis not present

## 2020-01-28 DIAGNOSIS — Z992 Dependence on renal dialysis: Secondary | ICD-10-CM | POA: Diagnosis not present

## 2020-01-28 DIAGNOSIS — D509 Iron deficiency anemia, unspecified: Secondary | ICD-10-CM | POA: Diagnosis not present

## 2020-01-28 DIAGNOSIS — N186 End stage renal disease: Secondary | ICD-10-CM | POA: Diagnosis not present

## 2020-01-29 DIAGNOSIS — N186 End stage renal disease: Secondary | ICD-10-CM | POA: Diagnosis not present

## 2020-01-29 DIAGNOSIS — D509 Iron deficiency anemia, unspecified: Secondary | ICD-10-CM | POA: Diagnosis not present

## 2020-01-29 DIAGNOSIS — Z992 Dependence on renal dialysis: Secondary | ICD-10-CM | POA: Diagnosis not present

## 2020-01-30 ENCOUNTER — Other Ambulatory Visit: Payer: Self-pay | Admitting: Nephrology

## 2020-01-30 DIAGNOSIS — D509 Iron deficiency anemia, unspecified: Secondary | ICD-10-CM | POA: Diagnosis not present

## 2020-01-30 DIAGNOSIS — R1310 Dysphagia, unspecified: Secondary | ICD-10-CM

## 2020-01-30 DIAGNOSIS — N186 End stage renal disease: Secondary | ICD-10-CM | POA: Diagnosis not present

## 2020-01-30 DIAGNOSIS — Z992 Dependence on renal dialysis: Secondary | ICD-10-CM | POA: Diagnosis not present

## 2020-01-30 NOTE — Progress Notes (Signed)
Barium swallow for difficulty swallowing

## 2020-01-31 DIAGNOSIS — D509 Iron deficiency anemia, unspecified: Secondary | ICD-10-CM | POA: Diagnosis not present

## 2020-01-31 DIAGNOSIS — Z992 Dependence on renal dialysis: Secondary | ICD-10-CM | POA: Diagnosis not present

## 2020-01-31 DIAGNOSIS — N186 End stage renal disease: Secondary | ICD-10-CM | POA: Diagnosis not present

## 2020-02-01 DIAGNOSIS — D509 Iron deficiency anemia, unspecified: Secondary | ICD-10-CM | POA: Diagnosis not present

## 2020-02-01 DIAGNOSIS — Z992 Dependence on renal dialysis: Secondary | ICD-10-CM | POA: Diagnosis not present

## 2020-02-01 DIAGNOSIS — N186 End stage renal disease: Secondary | ICD-10-CM | POA: Diagnosis not present

## 2020-02-02 DIAGNOSIS — N186 End stage renal disease: Secondary | ICD-10-CM | POA: Diagnosis not present

## 2020-02-02 DIAGNOSIS — Z992 Dependence on renal dialysis: Secondary | ICD-10-CM | POA: Diagnosis not present

## 2020-02-02 DIAGNOSIS — D509 Iron deficiency anemia, unspecified: Secondary | ICD-10-CM | POA: Diagnosis not present

## 2020-02-03 DIAGNOSIS — Z992 Dependence on renal dialysis: Secondary | ICD-10-CM | POA: Diagnosis not present

## 2020-02-03 DIAGNOSIS — N186 End stage renal disease: Secondary | ICD-10-CM | POA: Diagnosis not present

## 2020-02-03 DIAGNOSIS — D509 Iron deficiency anemia, unspecified: Secondary | ICD-10-CM | POA: Diagnosis not present

## 2020-02-04 DIAGNOSIS — Z992 Dependence on renal dialysis: Secondary | ICD-10-CM | POA: Diagnosis not present

## 2020-02-04 DIAGNOSIS — N186 End stage renal disease: Secondary | ICD-10-CM | POA: Diagnosis not present

## 2020-02-04 DIAGNOSIS — D509 Iron deficiency anemia, unspecified: Secondary | ICD-10-CM | POA: Diagnosis not present

## 2020-02-05 DIAGNOSIS — Z992 Dependence on renal dialysis: Secondary | ICD-10-CM | POA: Diagnosis not present

## 2020-02-05 DIAGNOSIS — N186 End stage renal disease: Secondary | ICD-10-CM | POA: Diagnosis not present

## 2020-02-05 DIAGNOSIS — D509 Iron deficiency anemia, unspecified: Secondary | ICD-10-CM | POA: Diagnosis not present

## 2020-02-06 DIAGNOSIS — D509 Iron deficiency anemia, unspecified: Secondary | ICD-10-CM | POA: Diagnosis not present

## 2020-02-06 DIAGNOSIS — N186 End stage renal disease: Secondary | ICD-10-CM | POA: Diagnosis not present

## 2020-02-06 DIAGNOSIS — Z992 Dependence on renal dialysis: Secondary | ICD-10-CM | POA: Diagnosis not present

## 2020-02-07 DIAGNOSIS — D509 Iron deficiency anemia, unspecified: Secondary | ICD-10-CM | POA: Diagnosis not present

## 2020-02-07 DIAGNOSIS — Z992 Dependence on renal dialysis: Secondary | ICD-10-CM | POA: Diagnosis not present

## 2020-02-07 DIAGNOSIS — N186 End stage renal disease: Secondary | ICD-10-CM | POA: Diagnosis not present

## 2020-02-08 DIAGNOSIS — Z992 Dependence on renal dialysis: Secondary | ICD-10-CM | POA: Diagnosis not present

## 2020-02-08 DIAGNOSIS — N186 End stage renal disease: Secondary | ICD-10-CM | POA: Diagnosis not present

## 2020-02-08 DIAGNOSIS — D509 Iron deficiency anemia, unspecified: Secondary | ICD-10-CM | POA: Diagnosis not present

## 2020-02-09 DIAGNOSIS — N186 End stage renal disease: Secondary | ICD-10-CM | POA: Diagnosis not present

## 2020-02-09 DIAGNOSIS — Z992 Dependence on renal dialysis: Secondary | ICD-10-CM | POA: Diagnosis not present

## 2020-02-09 DIAGNOSIS — D509 Iron deficiency anemia, unspecified: Secondary | ICD-10-CM | POA: Diagnosis not present

## 2020-02-10 DIAGNOSIS — N186 End stage renal disease: Secondary | ICD-10-CM | POA: Diagnosis not present

## 2020-02-10 DIAGNOSIS — D509 Iron deficiency anemia, unspecified: Secondary | ICD-10-CM | POA: Diagnosis not present

## 2020-02-10 DIAGNOSIS — Z992 Dependence on renal dialysis: Secondary | ICD-10-CM | POA: Diagnosis not present

## 2020-02-11 DIAGNOSIS — N186 End stage renal disease: Secondary | ICD-10-CM | POA: Diagnosis not present

## 2020-02-11 DIAGNOSIS — I2510 Atherosclerotic heart disease of native coronary artery without angina pectoris: Secondary | ICD-10-CM | POA: Diagnosis not present

## 2020-02-11 DIAGNOSIS — E785 Hyperlipidemia, unspecified: Secondary | ICD-10-CM | POA: Diagnosis not present

## 2020-02-11 DIAGNOSIS — D509 Iron deficiency anemia, unspecified: Secondary | ICD-10-CM | POA: Diagnosis not present

## 2020-02-11 DIAGNOSIS — Z992 Dependence on renal dialysis: Secondary | ICD-10-CM | POA: Diagnosis not present

## 2020-02-11 DIAGNOSIS — I10 Essential (primary) hypertension: Secondary | ICD-10-CM | POA: Diagnosis not present

## 2020-02-11 DIAGNOSIS — I340 Nonrheumatic mitral (valve) insufficiency: Secondary | ICD-10-CM | POA: Diagnosis not present

## 2020-02-11 DIAGNOSIS — Z951 Presence of aortocoronary bypass graft: Secondary | ICD-10-CM | POA: Diagnosis not present

## 2020-02-11 DIAGNOSIS — R609 Edema, unspecified: Secondary | ICD-10-CM | POA: Diagnosis not present

## 2020-02-12 DIAGNOSIS — N186 End stage renal disease: Secondary | ICD-10-CM | POA: Diagnosis not present

## 2020-02-12 DIAGNOSIS — D509 Iron deficiency anemia, unspecified: Secondary | ICD-10-CM | POA: Diagnosis not present

## 2020-02-12 DIAGNOSIS — Z992 Dependence on renal dialysis: Secondary | ICD-10-CM | POA: Diagnosis not present

## 2020-02-13 DIAGNOSIS — Z992 Dependence on renal dialysis: Secondary | ICD-10-CM | POA: Diagnosis not present

## 2020-02-13 DIAGNOSIS — D509 Iron deficiency anemia, unspecified: Secondary | ICD-10-CM | POA: Diagnosis not present

## 2020-02-13 DIAGNOSIS — N186 End stage renal disease: Secondary | ICD-10-CM | POA: Diagnosis not present

## 2020-02-14 DIAGNOSIS — N186 End stage renal disease: Secondary | ICD-10-CM | POA: Diagnosis not present

## 2020-02-14 DIAGNOSIS — D509 Iron deficiency anemia, unspecified: Secondary | ICD-10-CM | POA: Diagnosis not present

## 2020-02-14 DIAGNOSIS — D631 Anemia in chronic kidney disease: Secondary | ICD-10-CM | POA: Diagnosis not present

## 2020-02-14 DIAGNOSIS — E559 Vitamin D deficiency, unspecified: Secondary | ICD-10-CM | POA: Diagnosis not present

## 2020-02-14 DIAGNOSIS — E538 Deficiency of other specified B group vitamins: Secondary | ICD-10-CM | POA: Diagnosis not present

## 2020-02-14 DIAGNOSIS — Z992 Dependence on renal dialysis: Secondary | ICD-10-CM | POA: Diagnosis not present

## 2020-02-14 DIAGNOSIS — N2581 Secondary hyperparathyroidism of renal origin: Secondary | ICD-10-CM | POA: Diagnosis not present

## 2020-02-15 DIAGNOSIS — D509 Iron deficiency anemia, unspecified: Secondary | ICD-10-CM | POA: Diagnosis not present

## 2020-02-15 DIAGNOSIS — E559 Vitamin D deficiency, unspecified: Secondary | ICD-10-CM | POA: Diagnosis not present

## 2020-02-15 DIAGNOSIS — Z992 Dependence on renal dialysis: Secondary | ICD-10-CM | POA: Diagnosis not present

## 2020-02-15 DIAGNOSIS — D631 Anemia in chronic kidney disease: Secondary | ICD-10-CM | POA: Diagnosis not present

## 2020-02-15 DIAGNOSIS — N186 End stage renal disease: Secondary | ICD-10-CM | POA: Diagnosis not present

## 2020-02-15 DIAGNOSIS — E538 Deficiency of other specified B group vitamins: Secondary | ICD-10-CM | POA: Diagnosis not present

## 2020-02-16 ENCOUNTER — Telehealth: Payer: Self-pay

## 2020-02-16 DIAGNOSIS — D509 Iron deficiency anemia, unspecified: Secondary | ICD-10-CM | POA: Diagnosis not present

## 2020-02-16 DIAGNOSIS — E538 Deficiency of other specified B group vitamins: Secondary | ICD-10-CM | POA: Diagnosis not present

## 2020-02-16 DIAGNOSIS — D631 Anemia in chronic kidney disease: Secondary | ICD-10-CM | POA: Diagnosis not present

## 2020-02-16 DIAGNOSIS — N186 End stage renal disease: Secondary | ICD-10-CM | POA: Diagnosis not present

## 2020-02-16 DIAGNOSIS — E559 Vitamin D deficiency, unspecified: Secondary | ICD-10-CM | POA: Diagnosis not present

## 2020-02-16 DIAGNOSIS — Z992 Dependence on renal dialysis: Secondary | ICD-10-CM | POA: Diagnosis not present

## 2020-02-17 DIAGNOSIS — D509 Iron deficiency anemia, unspecified: Secondary | ICD-10-CM | POA: Diagnosis not present

## 2020-02-17 DIAGNOSIS — E559 Vitamin D deficiency, unspecified: Secondary | ICD-10-CM | POA: Diagnosis not present

## 2020-02-17 DIAGNOSIS — D631 Anemia in chronic kidney disease: Secondary | ICD-10-CM | POA: Diagnosis not present

## 2020-02-17 DIAGNOSIS — E538 Deficiency of other specified B group vitamins: Secondary | ICD-10-CM | POA: Diagnosis not present

## 2020-02-17 DIAGNOSIS — N186 End stage renal disease: Secondary | ICD-10-CM | POA: Diagnosis not present

## 2020-02-17 DIAGNOSIS — Z992 Dependence on renal dialysis: Secondary | ICD-10-CM | POA: Diagnosis not present

## 2020-02-18 ENCOUNTER — Telehealth: Payer: Self-pay | Admitting: Family Medicine

## 2020-02-18 DIAGNOSIS — D509 Iron deficiency anemia, unspecified: Secondary | ICD-10-CM | POA: Diagnosis not present

## 2020-02-18 DIAGNOSIS — E538 Deficiency of other specified B group vitamins: Secondary | ICD-10-CM | POA: Diagnosis not present

## 2020-02-18 DIAGNOSIS — E559 Vitamin D deficiency, unspecified: Secondary | ICD-10-CM | POA: Diagnosis not present

## 2020-02-18 DIAGNOSIS — Z992 Dependence on renal dialysis: Secondary | ICD-10-CM | POA: Diagnosis not present

## 2020-02-18 DIAGNOSIS — N186 End stage renal disease: Secondary | ICD-10-CM | POA: Diagnosis not present

## 2020-02-18 DIAGNOSIS — D631 Anemia in chronic kidney disease: Secondary | ICD-10-CM | POA: Diagnosis not present

## 2020-02-18 NOTE — Telephone Encounter (Signed)
Copied from [place] [id number]. Topic: Medicare AWV >> Feb 18, 2020  1:00 PM Cher Nakai R wrote: Reason for CRM:   Left message for patient to call back and schedule the Medicare Annual Wellness Visit (AWV) virtually.  Last AWV 02/20/2019  Please schedule at anytime with [facility]-Nurse Health Advisor.  45 minute appointment  Any questions, please call me at [phone]

## 2020-02-19 DIAGNOSIS — E559 Vitamin D deficiency, unspecified: Secondary | ICD-10-CM | POA: Diagnosis not present

## 2020-02-19 DIAGNOSIS — N186 End stage renal disease: Secondary | ICD-10-CM | POA: Diagnosis not present

## 2020-02-19 DIAGNOSIS — D631 Anemia in chronic kidney disease: Secondary | ICD-10-CM | POA: Diagnosis not present

## 2020-02-19 DIAGNOSIS — D509 Iron deficiency anemia, unspecified: Secondary | ICD-10-CM | POA: Diagnosis not present

## 2020-02-19 DIAGNOSIS — Z992 Dependence on renal dialysis: Secondary | ICD-10-CM | POA: Diagnosis not present

## 2020-02-19 DIAGNOSIS — E538 Deficiency of other specified B group vitamins: Secondary | ICD-10-CM | POA: Diagnosis not present

## 2020-02-20 DIAGNOSIS — N186 End stage renal disease: Secondary | ICD-10-CM | POA: Diagnosis not present

## 2020-02-20 DIAGNOSIS — E559 Vitamin D deficiency, unspecified: Secondary | ICD-10-CM | POA: Diagnosis not present

## 2020-02-20 DIAGNOSIS — Z992 Dependence on renal dialysis: Secondary | ICD-10-CM | POA: Diagnosis not present

## 2020-02-20 DIAGNOSIS — D509 Iron deficiency anemia, unspecified: Secondary | ICD-10-CM | POA: Diagnosis not present

## 2020-02-20 DIAGNOSIS — D631 Anemia in chronic kidney disease: Secondary | ICD-10-CM | POA: Diagnosis not present

## 2020-02-20 DIAGNOSIS — E538 Deficiency of other specified B group vitamins: Secondary | ICD-10-CM | POA: Diagnosis not present

## 2020-02-21 DIAGNOSIS — Z992 Dependence on renal dialysis: Secondary | ICD-10-CM | POA: Diagnosis not present

## 2020-02-21 DIAGNOSIS — D631 Anemia in chronic kidney disease: Secondary | ICD-10-CM | POA: Diagnosis not present

## 2020-02-21 DIAGNOSIS — E538 Deficiency of other specified B group vitamins: Secondary | ICD-10-CM | POA: Diagnosis not present

## 2020-02-21 DIAGNOSIS — E559 Vitamin D deficiency, unspecified: Secondary | ICD-10-CM | POA: Diagnosis not present

## 2020-02-21 DIAGNOSIS — N186 End stage renal disease: Secondary | ICD-10-CM | POA: Diagnosis not present

## 2020-02-21 DIAGNOSIS — D509 Iron deficiency anemia, unspecified: Secondary | ICD-10-CM | POA: Diagnosis not present

## 2020-02-22 DIAGNOSIS — D631 Anemia in chronic kidney disease: Secondary | ICD-10-CM | POA: Diagnosis not present

## 2020-02-22 DIAGNOSIS — E538 Deficiency of other specified B group vitamins: Secondary | ICD-10-CM | POA: Diagnosis not present

## 2020-02-22 DIAGNOSIS — Z992 Dependence on renal dialysis: Secondary | ICD-10-CM | POA: Diagnosis not present

## 2020-02-22 DIAGNOSIS — E559 Vitamin D deficiency, unspecified: Secondary | ICD-10-CM | POA: Diagnosis not present

## 2020-02-22 DIAGNOSIS — D509 Iron deficiency anemia, unspecified: Secondary | ICD-10-CM | POA: Diagnosis not present

## 2020-02-22 DIAGNOSIS — N186 End stage renal disease: Secondary | ICD-10-CM | POA: Diagnosis not present

## 2020-02-23 DIAGNOSIS — D631 Anemia in chronic kidney disease: Secondary | ICD-10-CM | POA: Diagnosis not present

## 2020-02-23 DIAGNOSIS — E538 Deficiency of other specified B group vitamins: Secondary | ICD-10-CM | POA: Diagnosis not present

## 2020-02-23 DIAGNOSIS — E559 Vitamin D deficiency, unspecified: Secondary | ICD-10-CM | POA: Diagnosis not present

## 2020-02-23 DIAGNOSIS — Z992 Dependence on renal dialysis: Secondary | ICD-10-CM | POA: Diagnosis not present

## 2020-02-23 DIAGNOSIS — E119 Type 2 diabetes mellitus without complications: Secondary | ICD-10-CM | POA: Diagnosis not present

## 2020-02-23 DIAGNOSIS — N186 End stage renal disease: Secondary | ICD-10-CM | POA: Diagnosis not present

## 2020-02-23 DIAGNOSIS — D509 Iron deficiency anemia, unspecified: Secondary | ICD-10-CM | POA: Diagnosis not present

## 2020-02-23 LAB — HEMOGLOBIN A1C: Hemoglobin A1C: 7.8

## 2020-02-24 DIAGNOSIS — D631 Anemia in chronic kidney disease: Secondary | ICD-10-CM | POA: Diagnosis not present

## 2020-02-24 DIAGNOSIS — Z992 Dependence on renal dialysis: Secondary | ICD-10-CM | POA: Diagnosis not present

## 2020-02-24 DIAGNOSIS — E538 Deficiency of other specified B group vitamins: Secondary | ICD-10-CM | POA: Diagnosis not present

## 2020-02-24 DIAGNOSIS — D509 Iron deficiency anemia, unspecified: Secondary | ICD-10-CM | POA: Diagnosis not present

## 2020-02-24 DIAGNOSIS — N186 End stage renal disease: Secondary | ICD-10-CM | POA: Diagnosis not present

## 2020-02-24 DIAGNOSIS — E559 Vitamin D deficiency, unspecified: Secondary | ICD-10-CM | POA: Diagnosis not present

## 2020-02-25 DIAGNOSIS — D631 Anemia in chronic kidney disease: Secondary | ICD-10-CM | POA: Diagnosis not present

## 2020-02-25 DIAGNOSIS — N186 End stage renal disease: Secondary | ICD-10-CM | POA: Diagnosis not present

## 2020-02-25 DIAGNOSIS — D509 Iron deficiency anemia, unspecified: Secondary | ICD-10-CM | POA: Diagnosis not present

## 2020-02-25 DIAGNOSIS — E538 Deficiency of other specified B group vitamins: Secondary | ICD-10-CM | POA: Diagnosis not present

## 2020-02-25 DIAGNOSIS — E559 Vitamin D deficiency, unspecified: Secondary | ICD-10-CM | POA: Diagnosis not present

## 2020-02-25 DIAGNOSIS — Z992 Dependence on renal dialysis: Secondary | ICD-10-CM | POA: Diagnosis not present

## 2020-02-26 DIAGNOSIS — D509 Iron deficiency anemia, unspecified: Secondary | ICD-10-CM | POA: Diagnosis not present

## 2020-02-26 DIAGNOSIS — Z992 Dependence on renal dialysis: Secondary | ICD-10-CM | POA: Diagnosis not present

## 2020-02-26 DIAGNOSIS — D631 Anemia in chronic kidney disease: Secondary | ICD-10-CM | POA: Diagnosis not present

## 2020-02-26 DIAGNOSIS — E559 Vitamin D deficiency, unspecified: Secondary | ICD-10-CM | POA: Diagnosis not present

## 2020-02-26 DIAGNOSIS — N186 End stage renal disease: Secondary | ICD-10-CM | POA: Diagnosis not present

## 2020-02-26 DIAGNOSIS — E538 Deficiency of other specified B group vitamins: Secondary | ICD-10-CM | POA: Diagnosis not present

## 2020-02-27 DIAGNOSIS — N186 End stage renal disease: Secondary | ICD-10-CM | POA: Diagnosis not present

## 2020-02-27 DIAGNOSIS — D631 Anemia in chronic kidney disease: Secondary | ICD-10-CM | POA: Diagnosis not present

## 2020-02-27 DIAGNOSIS — E559 Vitamin D deficiency, unspecified: Secondary | ICD-10-CM | POA: Diagnosis not present

## 2020-02-27 DIAGNOSIS — Z992 Dependence on renal dialysis: Secondary | ICD-10-CM | POA: Diagnosis not present

## 2020-02-27 DIAGNOSIS — D509 Iron deficiency anemia, unspecified: Secondary | ICD-10-CM | POA: Diagnosis not present

## 2020-02-27 DIAGNOSIS — E538 Deficiency of other specified B group vitamins: Secondary | ICD-10-CM | POA: Diagnosis not present

## 2020-02-28 DIAGNOSIS — E538 Deficiency of other specified B group vitamins: Secondary | ICD-10-CM | POA: Diagnosis not present

## 2020-02-28 DIAGNOSIS — D631 Anemia in chronic kidney disease: Secondary | ICD-10-CM | POA: Diagnosis not present

## 2020-02-28 DIAGNOSIS — D509 Iron deficiency anemia, unspecified: Secondary | ICD-10-CM | POA: Diagnosis not present

## 2020-02-28 DIAGNOSIS — E559 Vitamin D deficiency, unspecified: Secondary | ICD-10-CM | POA: Diagnosis not present

## 2020-02-28 DIAGNOSIS — Z992 Dependence on renal dialysis: Secondary | ICD-10-CM | POA: Diagnosis not present

## 2020-02-28 DIAGNOSIS — N186 End stage renal disease: Secondary | ICD-10-CM | POA: Diagnosis not present

## 2020-02-29 DIAGNOSIS — E538 Deficiency of other specified B group vitamins: Secondary | ICD-10-CM | POA: Diagnosis not present

## 2020-02-29 DIAGNOSIS — D631 Anemia in chronic kidney disease: Secondary | ICD-10-CM | POA: Diagnosis not present

## 2020-02-29 DIAGNOSIS — Z992 Dependence on renal dialysis: Secondary | ICD-10-CM | POA: Diagnosis not present

## 2020-02-29 DIAGNOSIS — E559 Vitamin D deficiency, unspecified: Secondary | ICD-10-CM | POA: Diagnosis not present

## 2020-02-29 DIAGNOSIS — N186 End stage renal disease: Secondary | ICD-10-CM | POA: Diagnosis not present

## 2020-02-29 DIAGNOSIS — D509 Iron deficiency anemia, unspecified: Secondary | ICD-10-CM | POA: Diagnosis not present

## 2020-03-01 DIAGNOSIS — N186 End stage renal disease: Secondary | ICD-10-CM | POA: Diagnosis not present

## 2020-03-01 DIAGNOSIS — Z992 Dependence on renal dialysis: Secondary | ICD-10-CM | POA: Diagnosis not present

## 2020-03-01 DIAGNOSIS — D509 Iron deficiency anemia, unspecified: Secondary | ICD-10-CM | POA: Diagnosis not present

## 2020-03-01 DIAGNOSIS — D631 Anemia in chronic kidney disease: Secondary | ICD-10-CM | POA: Diagnosis not present

## 2020-03-01 DIAGNOSIS — E538 Deficiency of other specified B group vitamins: Secondary | ICD-10-CM | POA: Diagnosis not present

## 2020-03-01 DIAGNOSIS — E559 Vitamin D deficiency, unspecified: Secondary | ICD-10-CM | POA: Diagnosis not present

## 2020-03-02 DIAGNOSIS — E538 Deficiency of other specified B group vitamins: Secondary | ICD-10-CM | POA: Diagnosis not present

## 2020-03-02 DIAGNOSIS — D631 Anemia in chronic kidney disease: Secondary | ICD-10-CM | POA: Diagnosis not present

## 2020-03-02 DIAGNOSIS — D509 Iron deficiency anemia, unspecified: Secondary | ICD-10-CM | POA: Diagnosis not present

## 2020-03-02 DIAGNOSIS — Z992 Dependence on renal dialysis: Secondary | ICD-10-CM | POA: Diagnosis not present

## 2020-03-02 DIAGNOSIS — E559 Vitamin D deficiency, unspecified: Secondary | ICD-10-CM | POA: Diagnosis not present

## 2020-03-02 DIAGNOSIS — N186 End stage renal disease: Secondary | ICD-10-CM | POA: Diagnosis not present

## 2020-03-03 DIAGNOSIS — D509 Iron deficiency anemia, unspecified: Secondary | ICD-10-CM | POA: Diagnosis not present

## 2020-03-03 DIAGNOSIS — E559 Vitamin D deficiency, unspecified: Secondary | ICD-10-CM | POA: Diagnosis not present

## 2020-03-03 DIAGNOSIS — Z992 Dependence on renal dialysis: Secondary | ICD-10-CM | POA: Diagnosis not present

## 2020-03-03 DIAGNOSIS — E538 Deficiency of other specified B group vitamins: Secondary | ICD-10-CM | POA: Diagnosis not present

## 2020-03-03 DIAGNOSIS — D631 Anemia in chronic kidney disease: Secondary | ICD-10-CM | POA: Diagnosis not present

## 2020-03-03 DIAGNOSIS — N186 End stage renal disease: Secondary | ICD-10-CM | POA: Diagnosis not present

## 2020-03-04 DIAGNOSIS — D509 Iron deficiency anemia, unspecified: Secondary | ICD-10-CM | POA: Diagnosis not present

## 2020-03-04 DIAGNOSIS — D631 Anemia in chronic kidney disease: Secondary | ICD-10-CM | POA: Diagnosis not present

## 2020-03-04 DIAGNOSIS — E559 Vitamin D deficiency, unspecified: Secondary | ICD-10-CM | POA: Diagnosis not present

## 2020-03-04 DIAGNOSIS — Z992 Dependence on renal dialysis: Secondary | ICD-10-CM | POA: Diagnosis not present

## 2020-03-04 DIAGNOSIS — N186 End stage renal disease: Secondary | ICD-10-CM | POA: Diagnosis not present

## 2020-03-04 DIAGNOSIS — E538 Deficiency of other specified B group vitamins: Secondary | ICD-10-CM | POA: Diagnosis not present

## 2020-03-05 DIAGNOSIS — E559 Vitamin D deficiency, unspecified: Secondary | ICD-10-CM | POA: Diagnosis not present

## 2020-03-05 DIAGNOSIS — D631 Anemia in chronic kidney disease: Secondary | ICD-10-CM | POA: Diagnosis not present

## 2020-03-05 DIAGNOSIS — N186 End stage renal disease: Secondary | ICD-10-CM | POA: Diagnosis not present

## 2020-03-05 DIAGNOSIS — D509 Iron deficiency anemia, unspecified: Secondary | ICD-10-CM | POA: Diagnosis not present

## 2020-03-05 DIAGNOSIS — Z992 Dependence on renal dialysis: Secondary | ICD-10-CM | POA: Diagnosis not present

## 2020-03-05 DIAGNOSIS — E538 Deficiency of other specified B group vitamins: Secondary | ICD-10-CM | POA: Diagnosis not present

## 2020-03-06 DIAGNOSIS — Z992 Dependence on renal dialysis: Secondary | ICD-10-CM | POA: Diagnosis not present

## 2020-03-06 DIAGNOSIS — E538 Deficiency of other specified B group vitamins: Secondary | ICD-10-CM | POA: Diagnosis not present

## 2020-03-06 DIAGNOSIS — N186 End stage renal disease: Secondary | ICD-10-CM | POA: Diagnosis not present

## 2020-03-06 DIAGNOSIS — D631 Anemia in chronic kidney disease: Secondary | ICD-10-CM | POA: Diagnosis not present

## 2020-03-06 DIAGNOSIS — D509 Iron deficiency anemia, unspecified: Secondary | ICD-10-CM | POA: Diagnosis not present

## 2020-03-06 DIAGNOSIS — E559 Vitamin D deficiency, unspecified: Secondary | ICD-10-CM | POA: Diagnosis not present

## 2020-03-07 DIAGNOSIS — D509 Iron deficiency anemia, unspecified: Secondary | ICD-10-CM | POA: Diagnosis not present

## 2020-03-07 DIAGNOSIS — Z992 Dependence on renal dialysis: Secondary | ICD-10-CM | POA: Diagnosis not present

## 2020-03-07 DIAGNOSIS — D631 Anemia in chronic kidney disease: Secondary | ICD-10-CM | POA: Diagnosis not present

## 2020-03-07 DIAGNOSIS — E538 Deficiency of other specified B group vitamins: Secondary | ICD-10-CM | POA: Diagnosis not present

## 2020-03-07 DIAGNOSIS — E559 Vitamin D deficiency, unspecified: Secondary | ICD-10-CM | POA: Diagnosis not present

## 2020-03-07 DIAGNOSIS — N186 End stage renal disease: Secondary | ICD-10-CM | POA: Diagnosis not present

## 2020-03-08 DIAGNOSIS — D631 Anemia in chronic kidney disease: Secondary | ICD-10-CM | POA: Diagnosis not present

## 2020-03-08 DIAGNOSIS — N186 End stage renal disease: Secondary | ICD-10-CM | POA: Diagnosis not present

## 2020-03-08 DIAGNOSIS — E559 Vitamin D deficiency, unspecified: Secondary | ICD-10-CM | POA: Diagnosis not present

## 2020-03-08 DIAGNOSIS — E538 Deficiency of other specified B group vitamins: Secondary | ICD-10-CM | POA: Diagnosis not present

## 2020-03-08 DIAGNOSIS — Z992 Dependence on renal dialysis: Secondary | ICD-10-CM | POA: Diagnosis not present

## 2020-03-08 DIAGNOSIS — D509 Iron deficiency anemia, unspecified: Secondary | ICD-10-CM | POA: Diagnosis not present

## 2020-03-09 DIAGNOSIS — X32XXXA Exposure to sunlight, initial encounter: Secondary | ICD-10-CM | POA: Diagnosis not present

## 2020-03-09 DIAGNOSIS — N186 End stage renal disease: Secondary | ICD-10-CM | POA: Diagnosis not present

## 2020-03-09 DIAGNOSIS — Z992 Dependence on renal dialysis: Secondary | ICD-10-CM | POA: Diagnosis not present

## 2020-03-09 DIAGNOSIS — E559 Vitamin D deficiency, unspecified: Secondary | ICD-10-CM | POA: Diagnosis not present

## 2020-03-09 DIAGNOSIS — D509 Iron deficiency anemia, unspecified: Secondary | ICD-10-CM | POA: Diagnosis not present

## 2020-03-09 DIAGNOSIS — L821 Other seborrheic keratosis: Secondary | ICD-10-CM | POA: Diagnosis not present

## 2020-03-09 DIAGNOSIS — D2272 Melanocytic nevi of left lower limb, including hip: Secondary | ICD-10-CM | POA: Diagnosis not present

## 2020-03-09 DIAGNOSIS — D2261 Melanocytic nevi of right upper limb, including shoulder: Secondary | ICD-10-CM | POA: Diagnosis not present

## 2020-03-09 DIAGNOSIS — S50811A Abrasion of right forearm, initial encounter: Secondary | ICD-10-CM | POA: Diagnosis not present

## 2020-03-09 DIAGNOSIS — D2271 Melanocytic nevi of right lower limb, including hip: Secondary | ICD-10-CM | POA: Diagnosis not present

## 2020-03-09 DIAGNOSIS — D225 Melanocytic nevi of trunk: Secondary | ICD-10-CM | POA: Diagnosis not present

## 2020-03-09 DIAGNOSIS — L570 Actinic keratosis: Secondary | ICD-10-CM | POA: Diagnosis not present

## 2020-03-09 DIAGNOSIS — D631 Anemia in chronic kidney disease: Secondary | ICD-10-CM | POA: Diagnosis not present

## 2020-03-09 DIAGNOSIS — E538 Deficiency of other specified B group vitamins: Secondary | ICD-10-CM | POA: Diagnosis not present

## 2020-03-09 DIAGNOSIS — D2262 Melanocytic nevi of left upper limb, including shoulder: Secondary | ICD-10-CM | POA: Diagnosis not present

## 2020-03-10 DIAGNOSIS — N186 End stage renal disease: Secondary | ICD-10-CM | POA: Diagnosis not present

## 2020-03-10 DIAGNOSIS — D631 Anemia in chronic kidney disease: Secondary | ICD-10-CM | POA: Diagnosis not present

## 2020-03-10 DIAGNOSIS — D509 Iron deficiency anemia, unspecified: Secondary | ICD-10-CM | POA: Diagnosis not present

## 2020-03-10 DIAGNOSIS — Z992 Dependence on renal dialysis: Secondary | ICD-10-CM | POA: Diagnosis not present

## 2020-03-10 DIAGNOSIS — E559 Vitamin D deficiency, unspecified: Secondary | ICD-10-CM | POA: Diagnosis not present

## 2020-03-10 DIAGNOSIS — E538 Deficiency of other specified B group vitamins: Secondary | ICD-10-CM | POA: Diagnosis not present

## 2020-03-11 DIAGNOSIS — D509 Iron deficiency anemia, unspecified: Secondary | ICD-10-CM | POA: Diagnosis not present

## 2020-03-11 DIAGNOSIS — D631 Anemia in chronic kidney disease: Secondary | ICD-10-CM | POA: Diagnosis not present

## 2020-03-11 DIAGNOSIS — N186 End stage renal disease: Secondary | ICD-10-CM | POA: Diagnosis not present

## 2020-03-11 DIAGNOSIS — Z992 Dependence on renal dialysis: Secondary | ICD-10-CM | POA: Diagnosis not present

## 2020-03-11 DIAGNOSIS — E538 Deficiency of other specified B group vitamins: Secondary | ICD-10-CM | POA: Diagnosis not present

## 2020-03-11 DIAGNOSIS — E559 Vitamin D deficiency, unspecified: Secondary | ICD-10-CM | POA: Diagnosis not present

## 2020-03-12 DIAGNOSIS — D509 Iron deficiency anemia, unspecified: Secondary | ICD-10-CM | POA: Diagnosis not present

## 2020-03-12 DIAGNOSIS — N186 End stage renal disease: Secondary | ICD-10-CM | POA: Diagnosis not present

## 2020-03-12 DIAGNOSIS — E559 Vitamin D deficiency, unspecified: Secondary | ICD-10-CM | POA: Diagnosis not present

## 2020-03-12 DIAGNOSIS — D631 Anemia in chronic kidney disease: Secondary | ICD-10-CM | POA: Diagnosis not present

## 2020-03-12 DIAGNOSIS — Z992 Dependence on renal dialysis: Secondary | ICD-10-CM | POA: Diagnosis not present

## 2020-03-12 DIAGNOSIS — E538 Deficiency of other specified B group vitamins: Secondary | ICD-10-CM | POA: Diagnosis not present

## 2020-03-13 DIAGNOSIS — E559 Vitamin D deficiency, unspecified: Secondary | ICD-10-CM | POA: Diagnosis not present

## 2020-03-13 DIAGNOSIS — N186 End stage renal disease: Secondary | ICD-10-CM | POA: Diagnosis not present

## 2020-03-13 DIAGNOSIS — Z992 Dependence on renal dialysis: Secondary | ICD-10-CM | POA: Diagnosis not present

## 2020-03-13 DIAGNOSIS — D509 Iron deficiency anemia, unspecified: Secondary | ICD-10-CM | POA: Diagnosis not present

## 2020-03-13 DIAGNOSIS — D631 Anemia in chronic kidney disease: Secondary | ICD-10-CM | POA: Diagnosis not present

## 2020-03-13 DIAGNOSIS — E538 Deficiency of other specified B group vitamins: Secondary | ICD-10-CM | POA: Diagnosis not present

## 2020-03-14 DIAGNOSIS — E559 Vitamin D deficiency, unspecified: Secondary | ICD-10-CM | POA: Diagnosis not present

## 2020-03-14 DIAGNOSIS — E538 Deficiency of other specified B group vitamins: Secondary | ICD-10-CM | POA: Diagnosis not present

## 2020-03-14 DIAGNOSIS — Z992 Dependence on renal dialysis: Secondary | ICD-10-CM | POA: Diagnosis not present

## 2020-03-14 DIAGNOSIS — D509 Iron deficiency anemia, unspecified: Secondary | ICD-10-CM | POA: Diagnosis not present

## 2020-03-14 DIAGNOSIS — D631 Anemia in chronic kidney disease: Secondary | ICD-10-CM | POA: Diagnosis not present

## 2020-03-14 DIAGNOSIS — N186 End stage renal disease: Secondary | ICD-10-CM | POA: Diagnosis not present

## 2020-03-15 ENCOUNTER — Ambulatory Visit (INDEPENDENT_AMBULATORY_CARE_PROVIDER_SITE_OTHER): Payer: Medicare Other | Admitting: Family Medicine

## 2020-03-15 ENCOUNTER — Encounter: Payer: Self-pay | Admitting: Family Medicine

## 2020-03-15 ENCOUNTER — Other Ambulatory Visit: Payer: Self-pay

## 2020-03-15 VITALS — BP 133/75 | HR 101 | Temp 97.8°F | Wt 140.0 lb

## 2020-03-15 DIAGNOSIS — N186 End stage renal disease: Secondary | ICD-10-CM | POA: Diagnosis not present

## 2020-03-15 DIAGNOSIS — E538 Deficiency of other specified B group vitamins: Secondary | ICD-10-CM | POA: Diagnosis not present

## 2020-03-15 DIAGNOSIS — N185 Chronic kidney disease, stage 5: Secondary | ICD-10-CM | POA: Diagnosis not present

## 2020-03-15 DIAGNOSIS — E559 Vitamin D deficiency, unspecified: Secondary | ICD-10-CM | POA: Diagnosis not present

## 2020-03-15 DIAGNOSIS — D631 Anemia in chronic kidney disease: Secondary | ICD-10-CM | POA: Diagnosis not present

## 2020-03-15 DIAGNOSIS — E1122 Type 2 diabetes mellitus with diabetic chronic kidney disease: Secondary | ICD-10-CM | POA: Diagnosis not present

## 2020-03-15 DIAGNOSIS — Z992 Dependence on renal dialysis: Secondary | ICD-10-CM | POA: Diagnosis not present

## 2020-03-15 DIAGNOSIS — D509 Iron deficiency anemia, unspecified: Secondary | ICD-10-CM | POA: Diagnosis not present

## 2020-03-15 NOTE — Progress Notes (Signed)
BP 133/75   Pulse (!) 101   Temp 97.8 F (36.6 C)   Wt 140 lb (63.5 kg)   SpO2 92%   BMI 23.90 kg/m    Subjective:    Patient ID: Ray Lamb, male    DOB: 1930/12/30, 87 y.o.   MRN: [id number]  HPI: Ray Lamb is a 87 y.o. male  Chief Complaint  Patient presents with  . Diabetes   DIABETES- has been cheating. Eating chocolate and little muffins Hypoglycemic episodes:no Polydipsia/polyuria: no Visual disturbance: no Chest pain: no Paresthesias: no Glucose Monitoring: yes  Accucheck frequency: Daily Taking Insulin?: no Blood Pressure Monitoring: not checking Retinal Examination: Up to Date Foot Exam: Up to Date Diabetic Education: Completed Pneumovax: Up to Date Influenza: Up to Date Aspirin: yes  Relevant past medical, surgical, family and social history reviewed and updated as indicated. Interim medical history since our last visit reviewed. Allergies and medications reviewed and updated.  Review of Systems  Constitutional: Negative.   Respiratory: Negative.   Cardiovascular: Positive for leg swelling. Negative for chest pain and palpitations.  Gastrointestinal: Negative.   Musculoskeletal: Negative.   Psychiatric/Behavioral: Negative.     Per HPI unless specifically indicated above     Objective:    BP 133/75   Pulse (!) 101   Temp 97.8 F (36.6 C)   Wt 140 lb (63.5 kg)   SpO2 92%   BMI 23.90 kg/m   Wt Readings from Last 3 Encounters:  03/15/20 140 lb (63.5 kg)  12/12/19 125 lb 9.6 oz (57 kg)  07/03/19 137 lb 3.2 oz (62.2 kg)    Physical Exam Vitals and nursing note reviewed.  Constitutional:      General: He is not in acute distress.    Appearance: Normal appearance. He is not ill-appearing, toxic-appearing or diaphoretic.  HENT:     Head: Normocephalic and atraumatic.     Right Ear: External ear normal.     Left Ear: External ear normal.     Nose: Nose normal.     Mouth/Throat:     Mouth: Mucous membranes are moist.     Pharynx:  Oropharynx is clear.  Eyes:     General: No scleral icterus.       Right eye: No discharge.        Left eye: No discharge.     Extraocular Movements: Extraocular movements intact.     Conjunctiva/sclera: Conjunctivae normal.     Pupils: Pupils are equal, round, and reactive to light.  Cardiovascular:     Rate and Rhythm: Normal rate and regular rhythm.     Pulses: Normal pulses.     Heart sounds: Normal heart sounds. No murmur heard. No friction rub. No gallop.   Pulmonary:     Effort: Pulmonary effort is normal. No respiratory distress.     Breath sounds: Normal breath sounds. No stridor. No wheezing, rhonchi or rales.  Chest:     Chest wall: No tenderness.  Musculoskeletal:        General: Normal range of motion.     Cervical back: Normal range of motion and neck supple.     Right lower leg: Edema (3+) present.     Left lower leg: Edema (3+) present.  Skin:    General: Skin is warm and dry.     Capillary Refill: Capillary refill takes less than 2 seconds.     Coloration: Skin is not jaundiced or pale.     Findings: No bruising,  erythema, lesion or rash.  Neurological:     General: No focal deficit present.     Mental Status: He is alert and oriented to person, place, and time. Mental status is at baseline.  Psychiatric:        Mood and Affect: Mood normal.        Behavior: Behavior normal.        Thought Content: Thought content normal.        Judgment: Judgment normal.     Results for orders placed or performed in visit on 03/15/20  Hemoglobin A1c  Result Value Ref Range   Hemoglobin A1C 7.8       Assessment & Plan:   Problem List Items Addressed This Visit      Endocrine   Diabetes ([place]) - Primary    Gone significantly in the wrong direction with A1c 7.8 up from 6.1. He would like to hold on medication changes for now. Will watch sweet consumption and recheck 3 months. Call with any concerns.           Follow up plan: Return in about 3 months (around  06/12/2020).

## 2020-03-15 NOTE — Assessment & Plan Note (Addendum)
Gone significantly in the wrong direction with A1c 7.8 up from 6.1. He would like to hold on medication changes for now. Will watch sweet consumption and recheck 3 months. Call with any concerns.

## 2020-03-16 DIAGNOSIS — N186 End stage renal disease: Secondary | ICD-10-CM | POA: Diagnosis not present

## 2020-03-16 DIAGNOSIS — D509 Iron deficiency anemia, unspecified: Secondary | ICD-10-CM | POA: Diagnosis not present

## 2020-03-16 DIAGNOSIS — Z992 Dependence on renal dialysis: Secondary | ICD-10-CM | POA: Diagnosis not present

## 2020-03-16 DIAGNOSIS — D631 Anemia in chronic kidney disease: Secondary | ICD-10-CM | POA: Diagnosis not present

## 2020-03-17 ENCOUNTER — Ambulatory Visit (INDEPENDENT_AMBULATORY_CARE_PROVIDER_SITE_OTHER): Payer: Medicare Other | Admitting: Pharmacist

## 2020-03-17 DIAGNOSIS — N186 End stage renal disease: Secondary | ICD-10-CM

## 2020-03-17 DIAGNOSIS — D509 Iron deficiency anemia, unspecified: Secondary | ICD-10-CM | POA: Diagnosis not present

## 2020-03-17 DIAGNOSIS — Z992 Dependence on renal dialysis: Secondary | ICD-10-CM | POA: Diagnosis not present

## 2020-03-17 DIAGNOSIS — N185 Chronic kidney disease, stage 5: Secondary | ICD-10-CM

## 2020-03-17 DIAGNOSIS — D631 Anemia in chronic kidney disease: Secondary | ICD-10-CM | POA: Diagnosis not present

## 2020-03-17 DIAGNOSIS — I10 Essential (primary) hypertension: Secondary | ICD-10-CM

## 2020-03-17 DIAGNOSIS — E119 Type 2 diabetes mellitus without complications: Secondary | ICD-10-CM | POA: Diagnosis not present

## 2020-03-17 LAB — HM DIABETES EYE EXAM

## 2020-03-17 NOTE — Patient Instructions (Addendum)
Visit Information  It was a pleasure speaking with you today. Thank you for letting me be part of your clinical team. Please call with any questions or concerns.   Goals Addressed            This Visit's Progress   . Pharmacy Care Plan       CARE PLAN ENTRY (see longitudinal plan of care for additional care plan information)  Current Barriers:  . Chronic Disease Management support, education, and care coordination needs related to Hypertension, Hyperlipidemia, Diabetes, Heart Failure, Coronary Artery Disease, Chronic Kidney Disease, and Alzheimers dementia   Hypertension/ESRD/CHF BP Readings from Last 3 Encounters:  12/12/19 120/72  07/16/19 137/72  07/03/19 (!) 151/69   . Pharmacist Clinical Goal(s): o Over the next 60  days, patient will work with PharmD and providers to maintain BP goal <140/90 . Current regimen:  . Carvedilol 12.5mg  qd . Torsemide 40 mg  . Isosorbide mononitrate ER 45 mg daily . Interventions: . Comprehensive medication review performed, medication list updated in electronic medical record . Reviewed current rx does not reflect once daily dosing of carvedilol. . Patient self care activities - Over the next 30 days, patient will: o Check BP daily, document, and provide at future appointments o Ensure daily salt intake < 2000 mg/day  Hyperlipidemia/ CAD/ Previous CVA Lab Results  Component Value Date/Time   LDLCALC 61 12/12/2019 03:11 PM   . Pharmacist Clinical Goal(s): o Over the next 60 days, patient will work with PharmD and providers to maintain LDL goal < 70 . Current regimen:  . Atorvastatin 80 mg daily . Aspirin 81 mg qod . ezetemibe 10 mg qd . Clopidogrel 75 mg qd . Interventions: . Comprehensive medication review performed, medication list updated in electronic medical record . Provided diet and exercise counseling. . Reviewed signs of bleeding . Reviewed fill history . Patient self care activities - Over the next 60 days, patient  will: o Take medications as prescribed  Diabetes Lab Results  Component Value Date/Time   HGBA1C 6.1 11/20/2019 12:00 AM   HGBA1C 9.1 (H) 07/03/2019 11:42 AM   HGBA1C 5.6 04/04/2019 10:40 AM   HGBA1C 7.2 10/06/2015 12:00 AM   . Pharmacist Clinical Goal(s): o Over the next 60 days, patient will work with PharmD and providers to achieve A1c goal <8% . Current regimen:  o Januvia 25 mg daily . Interventions: o Reviewed goal glucose readings for an A1c of <7%, we want to see fasting sugars <130 and 2 hour after meal sugars <180.  o Reviewed signs/symptoms of hypoglycemia o Reviewed fill history o Provided diet and exercise counseling. . Patient self care activities - Over the next 60 days, patient will: o Check blood sugar once daily and 3-4 times weekly, document, and provide at future appointments o Contact provider with any episodes of hypoglycemia o Reduce dietary carbohydrate, "sweets" intake    Medication management . Pharmacist Clinical Goal(s): o Over the next 60 days, patient will work with PharmD and providers to achieve optimal medication adherence . Current pharmacy: [facility] . Interventions o Comprehensive medication review performed. o Continue current medication management strategy. . Patient self care activities - Over the next 60 days, patient will: o Focus on medication adherence by using pill box and fill dates. o Take medications as prescribed o Report any questions or concerns to PharmD and/or provider(s)  Please see past updates related to this goal by clicking on the "Past Updates" button in the selected goal  The patient verbalized understanding of instructions, educational materials, and care plan provided today and agreed to receive a mailed copy of patient instructions, educational materials, and care plan.   Telephone follow up appointment with pharmacy team member scheduled for: 2 months  Junita Push. Kenton Kingfisher PharmD, BCPS Clinical  Pharmacist [phone]  Hypotension As your heart beats, it forces blood through your body. This force is called blood pressure. If you have hypotension, you have low blood pressure. When your blood pressure is too low, you may not get enough blood to your brain or other parts of your body. This may cause you to feel weak, light-headed, have a fast heartbeat, or even pass out (faint). Low blood pressure may be harmless, or it may cause serious problems. What are the causes?  Blood loss.  Not enough water in the body (dehydration).  Heart problems.  Hormone problems.  Pregnancy.  A very bad infection.  Not having enough of certain nutrients.  Very bad allergic reactions.  Certain medicines. What increases the risk?  Age. The risk increases as you get older.  Conditions that affect the heart or the brain and spinal cord (central nervous system).  Taking certain medicines.  Being pregnant. What are the signs or symptoms?  Feeling: ? Weak. ? Light-headed. ? Dizzy. ? Tired (fatigued).  Blurred vision.  Fast heartbeat.  Passing out, in very bad cases. How is this treated?  Changing your diet. This may involve eating more salt (sodium) or drinking more water.  Taking medicines to raise your blood pressure.  Changing how much you take (the dosage) of some of your medicines.  Wearing compression stockings. These stockings help to prevent blood clots and reduce swelling in your legs. In some cases, you may need to go to the hospital for:  Fluid replacement. This means you will receive fluids through an IV tube.  Blood replacement. This means you will receive donated blood through an IV tube (transfusion).  Treating an infection or heart problems, if this applies.  Monitoring. You may need to be monitored while medicines that you are taking wear off. Follow these instructions at home: Eating and drinking  Drink enough fluids to keep your pee (urine) pale  yellow.  Eat a healthy diet. Follow instructions from your doctor about what you can eat or drink. A healthy diet includes: ? Fresh fruits and vegetables. ? Whole grains. ? Low-fat (lean) meats. ? Low-fat dairy products.  Eat extra salt only as told. Do not add extra salt to your diet unless your doctor tells you to.  Eat small meals often.  Avoid standing up quickly after you eat.   Medicines  Take over-the-counter and prescription medicines only as told by your doctor. ? Follow instructions from your doctor about changing how much you take of your medicines, if this applies. ? Do not stop or change any of your medicines on your own. General instructions  Wear compression stockings as told by your doctor.  Get up slowly from lying down or sitting.  Avoid hot showers and a lot of heat as told by your doctor.  Return to your normal activities as told by your doctor. Ask what activities are safe for you.  Do not use any products that contain nicotine or tobacco, such as cigarettes, e-cigarettes, and chewing tobacco. If you need help quitting, ask your doctor.  Keep all follow-up visits as told by your doctor. This is important.   Contact a doctor if:  You throw up (vomit).  You have watery poop (diarrhea).  You have a fever for more than 2-3 days.  You feel more thirsty than normal.  You feel weak and tired. Get help right away if:  You have chest pain.  You have a fast or uneven heartbeat.  You lose feeling (have numbness) in any part of your body.  You cannot move your arms or your legs.  You have trouble talking.  You get sweaty or feel light-headed.  You pass out.  You have trouble breathing.  You have trouble staying awake.  You feel mixed up (confused). Summary  Hypotension is also called low blood pressure. It is when the force of blood pumping through your arteries is too weak.  Hypotension may be harmless, or it may cause serious  problems.  Treatment may include changing your diet and medicines, and wearing compression stockings.  In very bad cases, you may need to go to the hospital. This information is not intended to replace advice given to you by your health care provider. Make sure you discuss any questions you have with your health care provider. Document Revised: 07/26/2017 Document Reviewed: 07/26/2017 Elsevier Patient Education  [employer].

## 2020-03-17 NOTE — Chronic Care Management (AMB) (Signed)
Chronic Care Management Pharmacy  Name: Ray Lamb  MRN: [id number] DOB: [birth year]   Chief Complaint/ HPI  Ray Lamb,  87 y.o. , male presents for their Initial CCM visit with the clinical pharmacist In office.  PCP : Valerie Roys, DO Patient Care Team: Valerie Roys, DO as PCP - General (Family Medicine) Murlean Iba, MD (Internal Medicine) Dionisio David, MD as Consulting Physician (Cardiology) Sharlotte Alamo, [place] (Podiatry) Ronita Hipps, Elmer Ramp., MD (Dentistry) Lucilla Lame, MD as Consulting Physician (Gastroenterology) Vladimir Crofts, MD as Consulting Physician (Neurology) Minor, Dalbert Garnet, RN (Inactive) as [place] [facility] Sindy Guadeloupe, MD as Consulting Physician (Oncology) Vladimir Faster, [facility] as Pharmacist (Pharmacist)  Their chronic conditions include: Hypertension, Hyperlipidemia, Diabetes, Heart Failure, Coronary Artery Disease, Chronic Kidney Disease and Alzheimers dementia   Office Visits: 03/15/20- Dr.Johnson- A1c 12/12/19-Dr. Wynetta Emery- blood work  Consult Visit: 01/30/11- Dr. Emeline Gins swallow ordered 09/01/19- Tobias Alexander, PA- Neurology - dementia vascular and alzheimers , donepezil and Namenda stopped, melatonin for sleep   Allergies  Allergen Reactions  . Bee Venom Hives  . Iodinated Diagnostic Agents Rash  . Metrizamide Rash  . Other Rash and Other (See Comments)    Dye    Medications: Outpatient Encounter Medications as of 03/17/2020  Medication Sig Note  . ASPIRIN 81 PO Take by mouth every other day.    Marland Kitchen atorvastatin (LIPITOR) 80 MG tablet Take 1 tablet (80 mg total) by mouth at bedtime.   . carvedilol (COREG) 12.5 MG tablet Take by mouth 2 (two) times daily.    . clopidogrel (PLAVIX) 75 MG tablet Take 1 tablet (75 mg total) by mouth daily.   Marland Kitchen ezetimibe (ZETIA) 10 MG tablet Take 1 tablet (10 mg total) by mouth daily.   . isosorbide mononitrate (IMDUR) 30 MG 24 hr tablet Take 1.5 tablets (45 mg total) by mouth  daily.   . Lancets (ONETOUCH ULTRASOFT) lancets USE TO CHECK BLOOD SUGAR TWICE A DAY   . loratadine (CLARITIN) 10 MG tablet Take 10 mg by mouth daily.   . Melatonin 10 MG TABS Take 10 mg by mouth at bedtime.   . mirtazapine (REMERON) 7.5 MG tablet Take 7.5 mg by mouth as needed.  11/23/2019: Takes rarely for appetite stimualtion  . Multiple Vitamins-Minerals (CENTRUM SILVER ULTRA MENS PO) Take by mouth daily.   Marland Kitchen nystatin (MYCOSTATIN/NYSTOP) powder Apply 1 application topically 3 (three) times daily.   . ondansetron (ZOFRAN) 4 MG tablet Take 4 mg by mouth every 8 (eight) hours as needed.   . sitaGLIPtin (JANUVIA) 25 MG tablet Take 1 tablet (25 mg total) by mouth daily.   . timolol (TIMOPTIC) 0.5 % ophthalmic solution Place 1 drop into both eyes daily.    Marland Kitchen torsemide (DEMADEX) 20 MG tablet Take 40 mg by mouth daily.   . TURMERIC PO Take 1,300 mg by mouth daily.     Facility-Administered Encounter Medications as of 03/17/2020  Medication  . sodium chloride flush (NS) 0.9 % injection 3 mL    Wt Readings from Last 3 Encounters:  03/15/20 140 lb (63.5 kg)  12/12/19 125 lb 9.6 oz (57 kg)  07/03/19 137 lb 3.2 oz (62.2 kg)    Current Diagnosis/Assessment:    Goals Addressed            This Visit's Progress   . Pharmacy Care Plan       CARE PLAN ENTRY (see longitudinal plan of care for additional care plan  information)  Current Barriers:  . Chronic Disease Management support, education, and care coordination needs related to Hypertension, Hyperlipidemia, Diabetes, Heart Failure, Coronary Artery Disease, Chronic Kidney Disease, and Alzheimers dementia   Hypertension/ESRD/CHF BP Readings from Last 3 Encounters:  12/12/19 120/72  07/16/19 137/72  07/03/19 (!) 151/69   . Pharmacist Clinical Goal(s): o Over the next 60  days, patient will work with PharmD and providers to maintain BP goal <140/90 . Current regimen:  . Carvedilol 12.46m qd . Torsemide 40 mg  . Isosorbide  mononitrate ER 45 mg daily . Interventions: . Comprehensive medication review performed, medication list updated in electronic medical record . Reviewed current rx does not reflect once daily dosing of carvedilol. . Patient self care activities - Over the next 30 days, patient will: o Check BP daily, document, and provide at future appointments o Ensure daily salt intake < 2000 mg/day  Hyperlipidemia/ CAD/ Previous CVA Lab Results  Component Value Date/Time   LDLCALC 61 12/12/2019 03:11 PM   . Pharmacist Clinical Goal(s): o Over the next 60 days, patient will work with PharmD and providers to maintain LDL goal < 70 . Current regimen:  . Atorvastatin 80 mg daily . Aspirin 81 mg qod . ezetemibe 10 mg qd . Clopidogrel 75 mg qd . Interventions: . Comprehensive medication review performed, medication list updated in electronic medical record . Provided diet and exercise counseling. . Reviewed signs of bleeding . Reviewed fill history . Patient self care activities - Over the next 60 days, patient will: o Take medications as prescribed  Diabetes Lab Results  Component Value Date/Time   HGBA1C 6.1 11/20/2019 12:00 AM   HGBA1C 9.1 (H) 07/03/2019 11:42 AM   HGBA1C 5.6 04/04/2019 10:40 AM   HGBA1C 7.2 10/06/2015 12:00 AM   . Pharmacist Clinical Goal(s): o Over the next 60 days, patient will work with PharmD and providers to achieve A1c goal <8% . Current regimen:  o Januvia 25 mg daily . Interventions: o Reviewed goal glucose readings for an A1c of <7%, we want to see fasting sugars <130 and 2 hour after meal sugars <180.  o Reviewed signs/symptoms of hypoglycemia o Reviewed fill history o Provided diet and exercise counseling. . Patient self care activities - Over the next 60 days, patient will: o Check blood sugar once daily and 3-4 times weekly, document, and provide at future appointments o Contact provider with any episodes of hypoglycemia o Reduce dietary carbohydrate,  "sweets" intake    Medication management . Pharmacist Clinical Goal(s): o Over the next 60 days, patient will work with PharmD and providers to achieve optimal medication adherence . Current [facility] . Interventions o Comprehensive medication review performed. o Continue current medication management strategy. . Patient self care activities - Over the next 60 days, patient will: o Focus on medication adherence by using pill box and fill dates. o Take medications as prescribed o Report any questions or concerns to PharmD and/or provider(s)  Please see past updates related to this goal by clicking on the "Past Updates" button in the selected goal         Diabetes   A1c goal <8%  Recent Relevant Labs: Lab Results  Component Value Date/Time   HGBA1C 7.8 02/23/2020 12:00 AM   HGBA1C 6.1 11/20/2019 12:00 AM   MICROALBUR 150 (H) 07/03/2019 11:42 AM   MICROALBUR 150 (H) 01/02/2019 11:26 AM    Last diabetic Eye exam:  Lab Results  Component Value Date/Time   HMDIABEYEEXA No Retinopathy  04/02/2019 12:00 AM    Last diabetic Foot exam: No results found for: HMDIABFOOTEX   Checking BG: "periodically"  Recent FBG Readings: 152 yesterday, 142-150s  Patient has failed these meds in past: glipizide Patient is currently controlled on the following medications: . Januvia 25 mg daily   We discussed: Reviewed goal glucose readings for an A1c of <7%, we want to see fasting sugars <130 and 2 hour after meal sugars <180. Ray Lamb states he has not been checking every day but does check several times per week. He reports he will cut back on his sweets and recheck A1c in 3 months. He has increased his protein intake with whey supplements and Boost high protein. (~ 15 gm sugar, 28 gm carbs) per nephrology recommendations. Discuss lower carb option Glucerna high protein (260 mg sodium) with nephrology. He has an eye doctor appointment this afternoon.   Plan  Continue current  medication and manage with diet and exercise.  Hypertension/ ESRD/ CHF  On home peritoneal dialysis nightly BP goal is:  <140/90  Office blood pressures are  BP Readings from Last 3 Encounters:  03/15/20 133/75  12/12/19 120/72  07/16/19 137/72   BMP Latest Ref Rng & Units 12/12/2019 11/20/2019 07/03/2019  Glucose 65 - 99 mg/dL 129(H) - 193(H)  BUN 8 - 27 mg/dL 69(H) - 46(H)  Creatinine 0.76 - 1.27 mg/dL 6.34(H) - 4.36(H)  BUN/Creat Ratio 10 - 24 11 - 11  Sodium 134 - 144 mmol/L 137 - 134  Potassium 3.5 - 5.2 mmol/L 3.8 3.8 3.7  Chloride 96 - 106 mmol/L 92(L) - 91(L)  CO2 20 - 29 mmol/L 25 - 29  Calcium 8.6 - 10.2 mg/dL 8.1(L) 9.5 8.3(L)    Patient checks BP at home daily Patient home BP readings are ranging: last 2 weeks it has 112,115,120/50-60s HR: 68-72  Patient has failed these meds in the past: NA Patient is currently controlled on the following medications:  . Carvedilol 12.109m qd . Torsemide 40 mg qd . Isosorbide mononitrate ER 45 mg daily   We discussed patient is on nightly peritoneal dialysis at home. Follows up at dialysis center weekly. Documents daily BP readings every night before dialysis. Patient is managing his own medications, Glucometer and BP checks. Furosemide was changed to torsemide by nephrology. He is tolerating well but reports he continues to have swelling. He endorses a no-sodium diet. We discussed maintaining hydration. He reports good water intake. Per his report Dr. SCandiss Norsedecreased his carvedilol once daily on June 15.2021. He will discuss getting a new RX to reflect this change at upcoming visit.  Plan  Continue current medications.      Hyperlipidemia/ CAD/h/o CVA   LDL goal < 70  Lipid Panel     Component Value Date/Time   CHOL 116 12/12/2019 1511   CHOL 103 09/27/2018 0825   TRIG 110 12/12/2019 1511   TRIG 51 09/27/2018 0825   HDL 35 (L) 12/12/2019 1511   LDLCALC 61 12/12/2019 1511    Hepatic Function Latest Ref Rng & Units  12/12/2019 11/20/2019 07/03/2019  Total Protein 6.0 - 8.5 g/dL 8.1 - 7.6  Albumin 3.6 - 4.6 g/dL 3.1(L) 3.3(A) 3.2(L)  AST 0 - 40 IU/L 18 - 30  ALT 0 - 44 IU/L 11 - 22  Alk Phosphatase 44 - 121 IU/L 91 - 94  Total Bilirubin 0.0 - 1.2 mg/dL 0.2 - 0.3     The ASCVD Risk score (Mikey BussingDC Jr., et al., 2013) failed to  calculate for the following reasons:   The 2013 ASCVD risk score is only valid for ages 21 to 10   The patient has a prior MI or stroke diagnosis   Patient has failed these meds in past: NA Patient is currently controlled on the following medications:  . Atorvastatin 80 mg daily . Aspirin 81 mg qod . ezetemibe 10 mg qd . Clopidogrel 75 mg qd  We discussed:  Diet and exercise. Reviewed signs and symptoms of bleeding. Patient denies.  Plan  Continue current medications  Health Maintenance   Patient is currently taking on the following medications:  . Timolol 0.5 % ophthalmic drops . Melatonin 10 mg qhs . Centrum silver qd  We discussed:  Patient states he is still having trouble sleeping through the night. He goes to bed around 10 pm usually awakens around 1-2 am and will take melatonin which allows him to rest throughout the remainder of the night. He is interested in a supplement "Relaxium" and questions whether this would be okay for him to take. In reviewing online it contains, magnesium, valerian,chamomile, ashwaganda, GABA and melatonin.  He does not regularly nap and keeps consistent bedtimes. He has not tried herbal tea before bed and will try this. He reports he is sleeping pretty well and did not order the relaxium or start tea.  Plan  Continue current medications  Vaccines   Reviewed and discussed patient's vaccination history.    Immunization History  Administered Date(s) Administered  . Fluad Quad(high Dose 65+) 11/27/2018  . Influenza, High Dose Seasonal PF 11/26/2015, 12/11/2016, 12/05/2017, 11/12/2019  . Influenza,inj,Quad PF,6+ Mos 12/03/2014  .  Influenza-Unspecified 11/12/2013, 10/15/2018  . Moderna Sars-Covid-2 Vaccination 04/01/2019, 04/29/2019  . PPD Test 10/05/2017  . Pneumococcal Conjugate-13 09/03/2013  . Pneumococcal Polysaccharide-23 10/15/1995, 11/13/2000  . Pneumococcal-Unspecified 10/15/2018  . Td 08/08/2004, 10/06/2015  . Zoster 02/26/2006    Plan  Recommended patient receive Shingrix and COVID booster.   Medication Management   [facility] Uses pill box? Yes Pt endorses  compliance   Plan  Continue current medication management strategy.    Follow up: 2  month phone visit  Junita Push. Kenton Kingfisher PharmD, [name] [phone]

## 2020-03-18 DIAGNOSIS — Z992 Dependence on renal dialysis: Secondary | ICD-10-CM | POA: Diagnosis not present

## 2020-03-18 DIAGNOSIS — D631 Anemia in chronic kidney disease: Secondary | ICD-10-CM | POA: Diagnosis not present

## 2020-03-18 DIAGNOSIS — N186 End stage renal disease: Secondary | ICD-10-CM | POA: Diagnosis not present

## 2020-03-18 DIAGNOSIS — D509 Iron deficiency anemia, unspecified: Secondary | ICD-10-CM | POA: Diagnosis not present

## 2020-03-19 DIAGNOSIS — D631 Anemia in chronic kidney disease: Secondary | ICD-10-CM | POA: Diagnosis not present

## 2020-03-19 DIAGNOSIS — Z992 Dependence on renal dialysis: Secondary | ICD-10-CM | POA: Diagnosis not present

## 2020-03-19 DIAGNOSIS — D509 Iron deficiency anemia, unspecified: Secondary | ICD-10-CM | POA: Diagnosis not present

## 2020-03-19 DIAGNOSIS — N186 End stage renal disease: Secondary | ICD-10-CM | POA: Diagnosis not present

## 2020-03-20 DIAGNOSIS — D631 Anemia in chronic kidney disease: Secondary | ICD-10-CM | POA: Diagnosis not present

## 2020-03-20 DIAGNOSIS — N186 End stage renal disease: Secondary | ICD-10-CM | POA: Diagnosis not present

## 2020-03-20 DIAGNOSIS — D509 Iron deficiency anemia, unspecified: Secondary | ICD-10-CM | POA: Diagnosis not present

## 2020-03-20 DIAGNOSIS — Z992 Dependence on renal dialysis: Secondary | ICD-10-CM | POA: Diagnosis not present

## 2020-03-21 DIAGNOSIS — Z992 Dependence on renal dialysis: Secondary | ICD-10-CM | POA: Diagnosis not present

## 2020-03-21 DIAGNOSIS — D509 Iron deficiency anemia, unspecified: Secondary | ICD-10-CM | POA: Diagnosis not present

## 2020-03-21 DIAGNOSIS — D631 Anemia in chronic kidney disease: Secondary | ICD-10-CM | POA: Diagnosis not present

## 2020-03-21 DIAGNOSIS — N186 End stage renal disease: Secondary | ICD-10-CM | POA: Diagnosis not present

## 2020-03-22 DIAGNOSIS — Z992 Dependence on renal dialysis: Secondary | ICD-10-CM | POA: Diagnosis not present

## 2020-03-22 DIAGNOSIS — N186 End stage renal disease: Secondary | ICD-10-CM | POA: Diagnosis not present

## 2020-03-22 DIAGNOSIS — D631 Anemia in chronic kidney disease: Secondary | ICD-10-CM | POA: Diagnosis not present

## 2020-03-22 DIAGNOSIS — D509 Iron deficiency anemia, unspecified: Secondary | ICD-10-CM | POA: Diagnosis not present

## 2020-03-23 DIAGNOSIS — Z992 Dependence on renal dialysis: Secondary | ICD-10-CM | POA: Diagnosis not present

## 2020-03-23 DIAGNOSIS — N186 End stage renal disease: Secondary | ICD-10-CM | POA: Diagnosis not present

## 2020-03-23 DIAGNOSIS — D631 Anemia in chronic kidney disease: Secondary | ICD-10-CM | POA: Diagnosis not present

## 2020-03-23 DIAGNOSIS — D509 Iron deficiency anemia, unspecified: Secondary | ICD-10-CM | POA: Diagnosis not present

## 2020-03-24 DIAGNOSIS — D509 Iron deficiency anemia, unspecified: Secondary | ICD-10-CM | POA: Diagnosis not present

## 2020-03-24 DIAGNOSIS — D631 Anemia in chronic kidney disease: Secondary | ICD-10-CM | POA: Diagnosis not present

## 2020-03-24 DIAGNOSIS — N186 End stage renal disease: Secondary | ICD-10-CM | POA: Diagnosis not present

## 2020-03-24 DIAGNOSIS — Z992 Dependence on renal dialysis: Secondary | ICD-10-CM | POA: Diagnosis not present

## 2020-03-25 DIAGNOSIS — Z992 Dependence on renal dialysis: Secondary | ICD-10-CM | POA: Diagnosis not present

## 2020-03-25 DIAGNOSIS — N186 End stage renal disease: Secondary | ICD-10-CM | POA: Diagnosis not present

## 2020-03-25 DIAGNOSIS — D509 Iron deficiency anemia, unspecified: Secondary | ICD-10-CM | POA: Diagnosis not present

## 2020-03-25 DIAGNOSIS — D631 Anemia in chronic kidney disease: Secondary | ICD-10-CM | POA: Diagnosis not present

## 2020-03-26 DIAGNOSIS — N186 End stage renal disease: Secondary | ICD-10-CM | POA: Diagnosis not present

## 2020-03-26 DIAGNOSIS — D509 Iron deficiency anemia, unspecified: Secondary | ICD-10-CM | POA: Diagnosis not present

## 2020-03-26 DIAGNOSIS — Z992 Dependence on renal dialysis: Secondary | ICD-10-CM | POA: Diagnosis not present

## 2020-03-26 DIAGNOSIS — D631 Anemia in chronic kidney disease: Secondary | ICD-10-CM | POA: Diagnosis not present

## 2020-03-27 DIAGNOSIS — D631 Anemia in chronic kidney disease: Secondary | ICD-10-CM | POA: Diagnosis not present

## 2020-03-27 DIAGNOSIS — D509 Iron deficiency anemia, unspecified: Secondary | ICD-10-CM | POA: Diagnosis not present

## 2020-03-27 DIAGNOSIS — N186 End stage renal disease: Secondary | ICD-10-CM | POA: Diagnosis not present

## 2020-03-27 DIAGNOSIS — Z992 Dependence on renal dialysis: Secondary | ICD-10-CM | POA: Diagnosis not present

## 2020-03-28 DIAGNOSIS — D631 Anemia in chronic kidney disease: Secondary | ICD-10-CM | POA: Diagnosis not present

## 2020-03-28 DIAGNOSIS — Z992 Dependence on renal dialysis: Secondary | ICD-10-CM | POA: Diagnosis not present

## 2020-03-28 DIAGNOSIS — D509 Iron deficiency anemia, unspecified: Secondary | ICD-10-CM | POA: Diagnosis not present

## 2020-03-28 DIAGNOSIS — N186 End stage renal disease: Secondary | ICD-10-CM | POA: Diagnosis not present

## 2020-03-29 ENCOUNTER — Ambulatory Visit: Payer: Medicare Other | Admitting: Licensed Clinical Social Worker

## 2020-03-29 DIAGNOSIS — D509 Iron deficiency anemia, unspecified: Secondary | ICD-10-CM | POA: Diagnosis not present

## 2020-03-29 DIAGNOSIS — I10 Essential (primary) hypertension: Secondary | ICD-10-CM

## 2020-03-29 DIAGNOSIS — N184 Chronic kidney disease, stage 4 (severe): Secondary | ICD-10-CM

## 2020-03-29 DIAGNOSIS — E1122 Type 2 diabetes mellitus with diabetic chronic kidney disease: Secondary | ICD-10-CM | POA: Diagnosis not present

## 2020-03-29 DIAGNOSIS — N186 End stage renal disease: Secondary | ICD-10-CM

## 2020-03-29 DIAGNOSIS — D631 Anemia in chronic kidney disease: Secondary | ICD-10-CM | POA: Diagnosis not present

## 2020-03-29 DIAGNOSIS — N185 Chronic kidney disease, stage 5: Secondary | ICD-10-CM

## 2020-03-29 DIAGNOSIS — Z992 Dependence on renal dialysis: Secondary | ICD-10-CM | POA: Diagnosis not present

## 2020-03-29 NOTE — Chronic Care Management (AMB) (Signed)
Chronic Care Management    Clinical Social Work Note  03/29/2020 Name: Ray Lamb MRN: [id number] DOB: 1930/03/15  Ray Lamb is a 87 y.o. year old male who is a primary care patient of Valerie Roys, DO. The CCM team was consulted to assist the patient with chronic disease management and/or care coordination needs related to: Level of Care Concerns.   Engaged with patient by telephone for follow up visit in response to provider referral for social work chronic care management and care coordination services.   Consent to Services:  The patient was given the following information about Chronic Care Management services today, agreed to services, and gave verbal consent: 1. CCM service includes personalized support from designated clinical staff supervised by the primary care provider, including individualized plan of care and coordination with other care providers 2. 24/7 contact phone numbers for assistance for urgent and routine care needs. 3. Service will only be billed when office clinical staff spend 20 minutes or more in a month to coordinate care. 4. Only one practitioner may furnish and bill the service in a calendar month. 5.The patient may stop CCM services at any time (effective at the end of the month) by phone call to the office staff. 6. The patient will be responsible for cost sharing (co-pay) of up to 20% of the service fee (after annual deductible is met). Patient agreed to services and consent obtained.  Patient agreed to services and consent obtained.   Assessment: Review of patient past medical history, allergies, medications, and health status, including review of relevant consultants reports was performed today as part of a comprehensive evaluation and provision of chronic care management and care coordination services.     SDOH (Social Determinants of Health) assessments and interventions performed:    Advanced Directives Status: See Care Plan for related entries.  CCM  Care Plan  Allergies  Allergen Reactions  . Bee Venom Hives  . Iodinated Diagnostic Agents Rash  . Metrizamide Rash  . Other Rash and Other (See Comments)    Dye    Outpatient Encounter Medications as of 03/29/2020  Medication Sig Note  . ASPIRIN 81 PO Take by mouth every other day.    Marland Kitchen atorvastatin (LIPITOR) 80 MG tablet Take 1 tablet (80 mg total) by mouth at bedtime.   . carvedilol (COREG) 12.5 MG tablet Take by mouth 2 (two) times daily.    . clopidogrel (PLAVIX) 75 MG tablet Take 1 tablet (75 mg total) by mouth daily.   Marland Kitchen ezetimibe (ZETIA) 10 MG tablet Take 1 tablet (10 mg total) by mouth daily.   . isosorbide mononitrate (IMDUR) 30 MG 24 hr tablet Take 1.5 tablets (45 mg total) by mouth daily.   . Lancets (ONETOUCH ULTRASOFT) lancets USE TO CHECK BLOOD SUGAR TWICE A DAY   . loratadine (CLARITIN) 10 MG tablet Take 10 mg by mouth daily.   . Melatonin 10 MG TABS Take 10 mg by mouth at bedtime.   . mirtazapine (REMERON) 7.5 MG tablet Take 7.5 mg by mouth as needed.  11/23/2019: Takes rarely for appetite stimualtion  . Multiple Vitamins-Minerals (CENTRUM SILVER ULTRA MENS PO) Take by mouth daily.   Marland Kitchen nystatin (MYCOSTATIN/NYSTOP) powder Apply 1 application topically 3 (three) times daily.   . ondansetron (ZOFRAN) 4 MG tablet Take 4 mg by mouth every 8 (eight) hours as needed.   . sitaGLIPtin (JANUVIA) 25 MG tablet Take 1 tablet (25 mg total) by mouth daily.   . timolol (TIMOPTIC)  0.5 % ophthalmic solution Place 1 drop into both eyes daily.    Marland Kitchen torsemide (DEMADEX) 20 MG tablet Take 40 mg by mouth daily.   . TURMERIC PO Take 1,300 mg by mouth daily.     Facility-Administered Encounter Medications as of 03/29/2020  Medication  . sodium chloride flush (NS) 0.9 % injection 3 mL    Patient Active Problem List   Diagnosis Date Noted  . Anemia of chronic renal failure 01/27/2019  . ESRD (end stage renal disease) ([place]) 01/17/2019  . 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 Insomnia 03/14/2018  . Angina pectoris ([place]) 12/05/2017  . Chronic systolic (congestive) heart failure ([place]) 10/16/2017  . History of non-ST elevation myocardial infarction (NSTEMI)   . Palliative care by specialist   . CAD (coronary artery disease) 09/04/2017  . Chronic kidney disease 09/04/2017  . Pulmonary HTN ([place]) 09/04/2017  . Hiatal hernia 09/04/2017  . Mixed hyperlipidemia 09/04/2017  . Benign hypertension with CKD (chronic kidney disease) stage IV ([place]) 09/04/2017  . Mitral insufficiency 06/05/2017  . Alzheimer's dementia ([place]) 12/11/2016  . Advance care planning 05/03/2016  . Gastritis without bleeding   . Duodenitis   . Senile purpura ([place]) 12/03/2014  . Diabetes ([place]) 09/03/2014   Care Plan : General Social Work (Adult)  Updates made by Greg Cutter, LCSW since 03/29/2020 12:00 AM    Problem: Quality of Life (General Plan of Care)     Long-Range Goal: Quality of Life Maintained   Start Date: 03/29/2020  Priority: Medium  Note:   Evidence-based guidance:   Assess patient's thoughts about quality of life, goals and expectations, and dissatisfaction or desire to improve.   Identify issues of primary importance such as mental health, illness, exercise tolerance, pain, sexual function and intimacy, cognitive change, social isolation, finances and relationships.   Assess and monitor for signs/symptoms of psychosocial concerns, especially depression or ideations regarding harm to others or self; provide or refer for mental health services as needed.   Identify sensory issues that impact quality of life such as hearing loss, vision deficit; strategize ways to maintain or improve hearing, vision.   Promote access to services in the community to support independence such as support groups, home visiting programs, financial assistance, handicapped parking tags, durable medical equipment and emergency responder.   Promote activities to  decrease social isolation such as group support or social, leisure and recreational activities, employment, use of social media; consider safety concerns about being out of home for activities.   Provide patient an opportunity to share by storytelling or a "life review" to give positive meaning to life and to assist with coping and negative experiences.   Encourage patient to tap into hope to improve sense of self.   Counsel based on prognosis and as early as possible about end-of-life and palliative care; consider referral to palliative care provider.   Advocate for the development of palliative care plan that may include avoidance of unnecessary testing and intervention, symptom control, discontinuation of medications, hospice and organ donation.   Counsel as early as possible those with life-limiting chronic disease about palliative care; consider referral to palliative care provider.   Advocate for the development of palliative care plan.   Notes:   Timeframe:  Long-Range Goal Priority:  Medium  Start Date:  03/29/20                         Expected  End Date:  06/26/20                    Follow Up Date- 05/24/20   CARE PLAN ENTRY (see longitudinal plan of care for additional care plan information)  Current Barriers:  . Level of care concerns . ADL IADL limitations . Social Isolation . Inability to perform ADL's independently . Inability to perform IADL's independently  Clinical Social Work Clinical Goal(s):   Marland Kitchen Over the next 120 days, patient/caregiver will work with SW to address concerns related to care coordination needs and lack of education/support/resource connection. LCSW will assist patient in gaining community resource education and additional support and resource connection as well in order to maintain health and mental health appropriately  . Over the next 120 days, patient will demonstrate improved adherence to self care as evidenced by implementing healthy self-care  into his daily routine such as: attending all medical appointments, deep breathing exercises, taking time for self-reflection, taking medications as prescribed, drinking water and daily exercise to improve mobility and mood.  . Over the next 120 days, patient will demonstrate improved health management independence as evidenced by implementing healthy self-care skills and positive support/resources into his daily routine to help cope with stressors and improve overall health and well-being  . Over the next 120 days, patient or caregiver will verbalize basic understanding of depression/stress process and self health management plan as evidenced by her participation in development of long term plan of care and institution of self health management strategies  Interventions: . Inter-disciplinary care team collaboration (see longitudinal plan of care) . Patient interviewed and appropriate assessments performed . Patient reports that he tries to get socialization where he can. Patient was able to travel to the beach with his spouse, son and daughter in law in the summer of 2021 which provided him activity and socialization.  . Patient lives with spouse in their home and they both assist one another with various needs.Patient reports that he is receiving at home dialysis treatment. Patient was recently set up with a nutritionist through his dialysis physician. He reports that this has helped to improve his eating habits, mobility and strength. He reports that he is on a low salt diet and sees his nutritionist regularly.  . Provided mental health counseling with regard to acceptance. Patient is having a difficulty time adjusting to his new way of life and that he is unable to do what he once could, due to health reasons. Extensive emotional support provided to patient. Patient confirms having a very strong support network that he can lean on whenever he is struggling with this specific concern. He declines wanting  mental health support implementation at this time. . Provided patient with information about available in home support resources, day programs and senior resources/senior centers. Patient denies needing these resources at this time. Patient denies needing HH referral at this time. Patient and spouse will hire in home aide to assist with house work and light cleaning if needed. Patient's son and grandson live locally and are able to provide assistance when needed. Patient's grandson is the primary caretaker but their son is his secondary back-up as he lives further away in [facility].  . Patient and spouse deny any recent falls  . LCSW provided patient with [employer]'s number as his spouse has an upcoming appointment at [facility] and they will need a stable transportation to and from this.  . Discussed plans with patient for ongoing care management follow up and  provided patient with direct contact information for care management team . Advised patient to contact CCM program or CFP front desk for any case management needs that arise.  . Assisted patient/caregiver with obtaining information about health plan benefits . Provided education and assistance to client regarding Advanced Directives. . Provided education to patient/caregiver regarding level of care options. . Provided education to patient/caregiver about Hospice and/or Palliative Care services . Patient shares that he is stable at this time and had a slight fall within the past two weeks but was able to catch himself to prevent any injuries.   Patient Self Care Activities:  . Patient verbalizes understanding of plan to contact CFP or CCM team with any case management needs or concerns  . Attends all scheduled provider appointments . Calls provider office for new concerns or questions . Lacks social connections . Unable to perform ADLs independently . Unable to perform IADLs independently  Please see past updates related to this goal by clicking on  the "Past Updates" button in the selected goal    Task: Support and Maintain Acceptable Degree of Health, Comfort and Happiness   Note:   Care Management Activities:    - affirmation provided - community involvement promoted - expression of thoughts about present/future encouraged - independence in all possible areas promoted - life review by storytelling encouraged - patient strengths promoted - psychosocial concerns monitored - self-expression encouraged - sleep diary encouraged - sleep hygiene techniques encouraged - social relationships promoted - strategies to maintain hearing and/or vision promoted - strategies to maintain intimacy promoted - wellness behaviors promoted    Notes:       Follow Up Plan: SW will follow up with patient by phone over the next quarter      Eula Fried, BSW, [email or web address], [name] Phone: [phone]

## 2020-03-30 DIAGNOSIS — Z992 Dependence on renal dialysis: Secondary | ICD-10-CM | POA: Diagnosis not present

## 2020-03-30 DIAGNOSIS — D631 Anemia in chronic kidney disease: Secondary | ICD-10-CM | POA: Diagnosis not present

## 2020-03-30 DIAGNOSIS — N186 End stage renal disease: Secondary | ICD-10-CM | POA: Diagnosis not present

## 2020-03-30 DIAGNOSIS — D509 Iron deficiency anemia, unspecified: Secondary | ICD-10-CM | POA: Diagnosis not present

## 2020-03-31 DIAGNOSIS — D631 Anemia in chronic kidney disease: Secondary | ICD-10-CM | POA: Diagnosis not present

## 2020-03-31 DIAGNOSIS — N186 End stage renal disease: Secondary | ICD-10-CM | POA: Diagnosis not present

## 2020-03-31 DIAGNOSIS — Z992 Dependence on renal dialysis: Secondary | ICD-10-CM | POA: Diagnosis not present

## 2020-03-31 DIAGNOSIS — D509 Iron deficiency anemia, unspecified: Secondary | ICD-10-CM | POA: Diagnosis not present

## 2020-04-01 DIAGNOSIS — Z992 Dependence on renal dialysis: Secondary | ICD-10-CM | POA: Diagnosis not present

## 2020-04-01 DIAGNOSIS — D509 Iron deficiency anemia, unspecified: Secondary | ICD-10-CM | POA: Diagnosis not present

## 2020-04-01 DIAGNOSIS — D631 Anemia in chronic kidney disease: Secondary | ICD-10-CM | POA: Diagnosis not present

## 2020-04-01 DIAGNOSIS — N186 End stage renal disease: Secondary | ICD-10-CM | POA: Diagnosis not present

## 2020-04-02 DIAGNOSIS — D509 Iron deficiency anemia, unspecified: Secondary | ICD-10-CM | POA: Diagnosis not present

## 2020-04-02 DIAGNOSIS — D631 Anemia in chronic kidney disease: Secondary | ICD-10-CM | POA: Diagnosis not present

## 2020-04-02 DIAGNOSIS — Z992 Dependence on renal dialysis: Secondary | ICD-10-CM | POA: Diagnosis not present

## 2020-04-02 DIAGNOSIS — N186 End stage renal disease: Secondary | ICD-10-CM | POA: Diagnosis not present

## 2020-04-03 DIAGNOSIS — Z992 Dependence on renal dialysis: Secondary | ICD-10-CM | POA: Diagnosis not present

## 2020-04-03 DIAGNOSIS — D509 Iron deficiency anemia, unspecified: Secondary | ICD-10-CM | POA: Diagnosis not present

## 2020-04-03 DIAGNOSIS — N186 End stage renal disease: Secondary | ICD-10-CM | POA: Diagnosis not present

## 2020-04-03 DIAGNOSIS — D631 Anemia in chronic kidney disease: Secondary | ICD-10-CM | POA: Diagnosis not present

## 2020-04-04 DIAGNOSIS — N186 End stage renal disease: Secondary | ICD-10-CM | POA: Diagnosis not present

## 2020-04-04 DIAGNOSIS — D631 Anemia in chronic kidney disease: Secondary | ICD-10-CM | POA: Diagnosis not present

## 2020-04-04 DIAGNOSIS — Z992 Dependence on renal dialysis: Secondary | ICD-10-CM | POA: Diagnosis not present

## 2020-04-04 DIAGNOSIS — D509 Iron deficiency anemia, unspecified: Secondary | ICD-10-CM | POA: Diagnosis not present

## 2020-04-05 DIAGNOSIS — Z992 Dependence on renal dialysis: Secondary | ICD-10-CM | POA: Diagnosis not present

## 2020-04-05 DIAGNOSIS — N186 End stage renal disease: Secondary | ICD-10-CM | POA: Diagnosis not present

## 2020-04-05 DIAGNOSIS — D631 Anemia in chronic kidney disease: Secondary | ICD-10-CM | POA: Diagnosis not present

## 2020-04-05 DIAGNOSIS — D509 Iron deficiency anemia, unspecified: Secondary | ICD-10-CM | POA: Diagnosis not present

## 2020-04-06 DIAGNOSIS — N186 End stage renal disease: Secondary | ICD-10-CM | POA: Diagnosis not present

## 2020-04-06 DIAGNOSIS — D509 Iron deficiency anemia, unspecified: Secondary | ICD-10-CM | POA: Diagnosis not present

## 2020-04-06 DIAGNOSIS — Z992 Dependence on renal dialysis: Secondary | ICD-10-CM | POA: Diagnosis not present

## 2020-04-06 DIAGNOSIS — D631 Anemia in chronic kidney disease: Secondary | ICD-10-CM | POA: Diagnosis not present

## 2020-04-07 DIAGNOSIS — D509 Iron deficiency anemia, unspecified: Secondary | ICD-10-CM | POA: Diagnosis not present

## 2020-04-07 DIAGNOSIS — N186 End stage renal disease: Secondary | ICD-10-CM | POA: Diagnosis not present

## 2020-04-07 DIAGNOSIS — Z992 Dependence on renal dialysis: Secondary | ICD-10-CM | POA: Diagnosis not present

## 2020-04-07 DIAGNOSIS — D631 Anemia in chronic kidney disease: Secondary | ICD-10-CM | POA: Diagnosis not present

## 2020-04-08 DIAGNOSIS — N186 End stage renal disease: Secondary | ICD-10-CM | POA: Diagnosis not present

## 2020-04-08 DIAGNOSIS — D631 Anemia in chronic kidney disease: Secondary | ICD-10-CM | POA: Diagnosis not present

## 2020-04-08 DIAGNOSIS — D509 Iron deficiency anemia, unspecified: Secondary | ICD-10-CM | POA: Diagnosis not present

## 2020-04-08 DIAGNOSIS — Z992 Dependence on renal dialysis: Secondary | ICD-10-CM | POA: Diagnosis not present

## 2020-04-09 DIAGNOSIS — D631 Anemia in chronic kidney disease: Secondary | ICD-10-CM | POA: Diagnosis not present

## 2020-04-09 DIAGNOSIS — D509 Iron deficiency anemia, unspecified: Secondary | ICD-10-CM | POA: Diagnosis not present

## 2020-04-09 DIAGNOSIS — N186 End stage renal disease: Secondary | ICD-10-CM | POA: Diagnosis not present

## 2020-04-09 DIAGNOSIS — Z992 Dependence on renal dialysis: Secondary | ICD-10-CM | POA: Diagnosis not present

## 2020-04-10 DIAGNOSIS — Z992 Dependence on renal dialysis: Secondary | ICD-10-CM | POA: Diagnosis not present

## 2020-04-10 DIAGNOSIS — N186 End stage renal disease: Secondary | ICD-10-CM | POA: Diagnosis not present

## 2020-04-10 DIAGNOSIS — D509 Iron deficiency anemia, unspecified: Secondary | ICD-10-CM | POA: Diagnosis not present

## 2020-04-10 DIAGNOSIS — D631 Anemia in chronic kidney disease: Secondary | ICD-10-CM | POA: Diagnosis not present

## 2020-04-11 DIAGNOSIS — D509 Iron deficiency anemia, unspecified: Secondary | ICD-10-CM | POA: Diagnosis not present

## 2020-04-11 DIAGNOSIS — N186 End stage renal disease: Secondary | ICD-10-CM | POA: Diagnosis not present

## 2020-04-11 DIAGNOSIS — Z992 Dependence on renal dialysis: Secondary | ICD-10-CM | POA: Diagnosis not present

## 2020-04-11 DIAGNOSIS — D631 Anemia in chronic kidney disease: Secondary | ICD-10-CM | POA: Diagnosis not present

## 2020-04-12 DIAGNOSIS — N186 End stage renal disease: Secondary | ICD-10-CM | POA: Diagnosis not present

## 2020-04-12 DIAGNOSIS — D631 Anemia in chronic kidney disease: Secondary | ICD-10-CM | POA: Diagnosis not present

## 2020-04-12 DIAGNOSIS — D509 Iron deficiency anemia, unspecified: Secondary | ICD-10-CM | POA: Diagnosis not present

## 2020-04-12 DIAGNOSIS — Z992 Dependence on renal dialysis: Secondary | ICD-10-CM | POA: Diagnosis not present

## 2020-04-13 DIAGNOSIS — Z992 Dependence on renal dialysis: Secondary | ICD-10-CM | POA: Diagnosis not present

## 2020-04-13 DIAGNOSIS — Z298 Encounter for other specified prophylactic measures: Secondary | ICD-10-CM | POA: Diagnosis not present

## 2020-04-13 DIAGNOSIS — D509 Iron deficiency anemia, unspecified: Secondary | ICD-10-CM | POA: Diagnosis not present

## 2020-04-13 DIAGNOSIS — N186 End stage renal disease: Secondary | ICD-10-CM | POA: Diagnosis not present

## 2020-04-13 DIAGNOSIS — D631 Anemia in chronic kidney disease: Secondary | ICD-10-CM | POA: Diagnosis not present

## 2020-04-14 DIAGNOSIS — Z298 Encounter for other specified prophylactic measures: Secondary | ICD-10-CM | POA: Diagnosis not present

## 2020-04-14 DIAGNOSIS — Z992 Dependence on renal dialysis: Secondary | ICD-10-CM | POA: Diagnosis not present

## 2020-04-14 DIAGNOSIS — D509 Iron deficiency anemia, unspecified: Secondary | ICD-10-CM | POA: Diagnosis not present

## 2020-04-14 DIAGNOSIS — N186 End stage renal disease: Secondary | ICD-10-CM | POA: Diagnosis not present

## 2020-04-14 DIAGNOSIS — D631 Anemia in chronic kidney disease: Secondary | ICD-10-CM | POA: Diagnosis not present

## 2020-04-15 DIAGNOSIS — Z298 Encounter for other specified prophylactic measures: Secondary | ICD-10-CM | POA: Diagnosis not present

## 2020-04-15 DIAGNOSIS — D509 Iron deficiency anemia, unspecified: Secondary | ICD-10-CM | POA: Diagnosis not present

## 2020-04-15 DIAGNOSIS — Z992 Dependence on renal dialysis: Secondary | ICD-10-CM | POA: Diagnosis not present

## 2020-04-15 DIAGNOSIS — N186 End stage renal disease: Secondary | ICD-10-CM | POA: Diagnosis not present

## 2020-04-15 DIAGNOSIS — D631 Anemia in chronic kidney disease: Secondary | ICD-10-CM | POA: Diagnosis not present

## 2020-04-16 DIAGNOSIS — Z298 Encounter for other specified prophylactic measures: Secondary | ICD-10-CM | POA: Diagnosis not present

## 2020-04-16 DIAGNOSIS — D631 Anemia in chronic kidney disease: Secondary | ICD-10-CM | POA: Diagnosis not present

## 2020-04-16 DIAGNOSIS — N186 End stage renal disease: Secondary | ICD-10-CM | POA: Diagnosis not present

## 2020-04-16 DIAGNOSIS — Z992 Dependence on renal dialysis: Secondary | ICD-10-CM | POA: Diagnosis not present

## 2020-04-16 DIAGNOSIS — D509 Iron deficiency anemia, unspecified: Secondary | ICD-10-CM | POA: Diagnosis not present

## 2020-04-17 DIAGNOSIS — D631 Anemia in chronic kidney disease: Secondary | ICD-10-CM | POA: Diagnosis not present

## 2020-04-17 DIAGNOSIS — N186 End stage renal disease: Secondary | ICD-10-CM | POA: Diagnosis not present

## 2020-04-17 DIAGNOSIS — Z992 Dependence on renal dialysis: Secondary | ICD-10-CM | POA: Diagnosis not present

## 2020-04-17 DIAGNOSIS — Z298 Encounter for other specified prophylactic measures: Secondary | ICD-10-CM | POA: Diagnosis not present

## 2020-04-17 DIAGNOSIS — D509 Iron deficiency anemia, unspecified: Secondary | ICD-10-CM | POA: Diagnosis not present

## 2020-04-18 DIAGNOSIS — Z992 Dependence on renal dialysis: Secondary | ICD-10-CM | POA: Diagnosis not present

## 2020-04-18 DIAGNOSIS — D509 Iron deficiency anemia, unspecified: Secondary | ICD-10-CM | POA: Diagnosis not present

## 2020-04-18 DIAGNOSIS — Z298 Encounter for other specified prophylactic measures: Secondary | ICD-10-CM | POA: Diagnosis not present

## 2020-04-18 DIAGNOSIS — N186 End stage renal disease: Secondary | ICD-10-CM | POA: Diagnosis not present

## 2020-04-18 DIAGNOSIS — D631 Anemia in chronic kidney disease: Secondary | ICD-10-CM | POA: Diagnosis not present

## 2020-04-19 DIAGNOSIS — Z298 Encounter for other specified prophylactic measures: Secondary | ICD-10-CM | POA: Diagnosis not present

## 2020-04-19 DIAGNOSIS — N186 End stage renal disease: Secondary | ICD-10-CM | POA: Diagnosis not present

## 2020-04-19 DIAGNOSIS — D509 Iron deficiency anemia, unspecified: Secondary | ICD-10-CM | POA: Diagnosis not present

## 2020-04-19 DIAGNOSIS — D631 Anemia in chronic kidney disease: Secondary | ICD-10-CM | POA: Diagnosis not present

## 2020-04-19 DIAGNOSIS — Z992 Dependence on renal dialysis: Secondary | ICD-10-CM | POA: Diagnosis not present

## 2020-04-20 DIAGNOSIS — N186 End stage renal disease: Secondary | ICD-10-CM | POA: Diagnosis not present

## 2020-04-20 DIAGNOSIS — D509 Iron deficiency anemia, unspecified: Secondary | ICD-10-CM | POA: Diagnosis not present

## 2020-04-20 DIAGNOSIS — D631 Anemia in chronic kidney disease: Secondary | ICD-10-CM | POA: Diagnosis not present

## 2020-04-20 DIAGNOSIS — Z298 Encounter for other specified prophylactic measures: Secondary | ICD-10-CM | POA: Diagnosis not present

## 2020-04-20 DIAGNOSIS — Z992 Dependence on renal dialysis: Secondary | ICD-10-CM | POA: Diagnosis not present

## 2020-04-21 DIAGNOSIS — D509 Iron deficiency anemia, unspecified: Secondary | ICD-10-CM | POA: Diagnosis not present

## 2020-04-21 DIAGNOSIS — Z992 Dependence on renal dialysis: Secondary | ICD-10-CM | POA: Diagnosis not present

## 2020-04-21 DIAGNOSIS — Z298 Encounter for other specified prophylactic measures: Secondary | ICD-10-CM | POA: Diagnosis not present

## 2020-04-21 DIAGNOSIS — D631 Anemia in chronic kidney disease: Secondary | ICD-10-CM | POA: Diagnosis not present

## 2020-04-21 DIAGNOSIS — N186 End stage renal disease: Secondary | ICD-10-CM | POA: Diagnosis not present

## 2020-04-22 DIAGNOSIS — Z992 Dependence on renal dialysis: Secondary | ICD-10-CM | POA: Diagnosis not present

## 2020-04-22 DIAGNOSIS — D509 Iron deficiency anemia, unspecified: Secondary | ICD-10-CM | POA: Diagnosis not present

## 2020-04-22 DIAGNOSIS — N186 End stage renal disease: Secondary | ICD-10-CM | POA: Diagnosis not present

## 2020-04-22 DIAGNOSIS — D631 Anemia in chronic kidney disease: Secondary | ICD-10-CM | POA: Diagnosis not present

## 2020-04-22 DIAGNOSIS — Z298 Encounter for other specified prophylactic measures: Secondary | ICD-10-CM | POA: Diagnosis not present

## 2020-04-23 DIAGNOSIS — D631 Anemia in chronic kidney disease: Secondary | ICD-10-CM | POA: Diagnosis not present

## 2020-04-23 DIAGNOSIS — N186 End stage renal disease: Secondary | ICD-10-CM | POA: Diagnosis not present

## 2020-04-23 DIAGNOSIS — Z992 Dependence on renal dialysis: Secondary | ICD-10-CM | POA: Diagnosis not present

## 2020-04-23 DIAGNOSIS — D509 Iron deficiency anemia, unspecified: Secondary | ICD-10-CM | POA: Diagnosis not present

## 2020-04-23 DIAGNOSIS — Z298 Encounter for other specified prophylactic measures: Secondary | ICD-10-CM | POA: Diagnosis not present

## 2020-04-24 DIAGNOSIS — D631 Anemia in chronic kidney disease: Secondary | ICD-10-CM | POA: Diagnosis not present

## 2020-04-24 DIAGNOSIS — Z992 Dependence on renal dialysis: Secondary | ICD-10-CM | POA: Diagnosis not present

## 2020-04-24 DIAGNOSIS — N186 End stage renal disease: Secondary | ICD-10-CM | POA: Diagnosis not present

## 2020-04-24 DIAGNOSIS — Z298 Encounter for other specified prophylactic measures: Secondary | ICD-10-CM | POA: Diagnosis not present

## 2020-04-24 DIAGNOSIS — D509 Iron deficiency anemia, unspecified: Secondary | ICD-10-CM | POA: Diagnosis not present

## 2020-04-25 DIAGNOSIS — D631 Anemia in chronic kidney disease: Secondary | ICD-10-CM | POA: Diagnosis not present

## 2020-04-25 DIAGNOSIS — Z298 Encounter for other specified prophylactic measures: Secondary | ICD-10-CM | POA: Diagnosis not present

## 2020-04-25 DIAGNOSIS — N186 End stage renal disease: Secondary | ICD-10-CM | POA: Diagnosis not present

## 2020-04-25 DIAGNOSIS — D509 Iron deficiency anemia, unspecified: Secondary | ICD-10-CM | POA: Diagnosis not present

## 2020-04-25 DIAGNOSIS — Z992 Dependence on renal dialysis: Secondary | ICD-10-CM | POA: Diagnosis not present

## 2020-04-26 DIAGNOSIS — D631 Anemia in chronic kidney disease: Secondary | ICD-10-CM | POA: Diagnosis not present

## 2020-04-26 DIAGNOSIS — N186 End stage renal disease: Secondary | ICD-10-CM | POA: Diagnosis not present

## 2020-04-26 DIAGNOSIS — Z992 Dependence on renal dialysis: Secondary | ICD-10-CM | POA: Diagnosis not present

## 2020-04-26 DIAGNOSIS — D509 Iron deficiency anemia, unspecified: Secondary | ICD-10-CM | POA: Diagnosis not present

## 2020-04-26 DIAGNOSIS — Z298 Encounter for other specified prophylactic measures: Secondary | ICD-10-CM | POA: Diagnosis not present

## 2020-04-27 DIAGNOSIS — Z992 Dependence on renal dialysis: Secondary | ICD-10-CM | POA: Diagnosis not present

## 2020-04-27 DIAGNOSIS — F0280 Dementia in other diseases classified elsewhere without behavioral disturbance: Secondary | ICD-10-CM | POA: Diagnosis not present

## 2020-04-27 DIAGNOSIS — G301 Alzheimer's disease with late onset: Secondary | ICD-10-CM | POA: Diagnosis not present

## 2020-04-27 DIAGNOSIS — Z298 Encounter for other specified prophylactic measures: Secondary | ICD-10-CM | POA: Diagnosis not present

## 2020-04-27 DIAGNOSIS — D631 Anemia in chronic kidney disease: Secondary | ICD-10-CM | POA: Diagnosis not present

## 2020-04-27 DIAGNOSIS — D509 Iron deficiency anemia, unspecified: Secondary | ICD-10-CM | POA: Diagnosis not present

## 2020-04-27 DIAGNOSIS — N186 End stage renal disease: Secondary | ICD-10-CM | POA: Diagnosis not present

## 2020-04-28 DIAGNOSIS — Z992 Dependence on renal dialysis: Secondary | ICD-10-CM | POA: Diagnosis not present

## 2020-04-28 DIAGNOSIS — N186 End stage renal disease: Secondary | ICD-10-CM | POA: Diagnosis not present

## 2020-04-28 DIAGNOSIS — Z298 Encounter for other specified prophylactic measures: Secondary | ICD-10-CM | POA: Diagnosis not present

## 2020-04-28 DIAGNOSIS — D509 Iron deficiency anemia, unspecified: Secondary | ICD-10-CM | POA: Diagnosis not present

## 2020-04-28 DIAGNOSIS — D631 Anemia in chronic kidney disease: Secondary | ICD-10-CM | POA: Diagnosis not present

## 2020-04-29 DIAGNOSIS — N186 End stage renal disease: Secondary | ICD-10-CM | POA: Diagnosis not present

## 2020-04-29 DIAGNOSIS — D631 Anemia in chronic kidney disease: Secondary | ICD-10-CM | POA: Diagnosis not present

## 2020-04-29 DIAGNOSIS — D509 Iron deficiency anemia, unspecified: Secondary | ICD-10-CM | POA: Diagnosis not present

## 2020-04-29 DIAGNOSIS — Z992 Dependence on renal dialysis: Secondary | ICD-10-CM | POA: Diagnosis not present

## 2020-04-29 DIAGNOSIS — Z298 Encounter for other specified prophylactic measures: Secondary | ICD-10-CM | POA: Diagnosis not present

## 2020-04-30 DIAGNOSIS — G301 Alzheimer's disease with late onset: Secondary | ICD-10-CM | POA: Diagnosis not present

## 2020-04-30 DIAGNOSIS — Z9181 History of falling: Secondary | ICD-10-CM | POA: Diagnosis not present

## 2020-04-30 DIAGNOSIS — E785 Hyperlipidemia, unspecified: Secondary | ICD-10-CM | POA: Diagnosis not present

## 2020-04-30 DIAGNOSIS — I272 Pulmonary hypertension, unspecified: Secondary | ICD-10-CM | POA: Diagnosis not present

## 2020-04-30 DIAGNOSIS — Z298 Encounter for other specified prophylactic measures: Secondary | ICD-10-CM | POA: Diagnosis not present

## 2020-04-30 DIAGNOSIS — D631 Anemia in chronic kidney disease: Secondary | ICD-10-CM | POA: Diagnosis not present

## 2020-04-30 DIAGNOSIS — Z951 Presence of aortocoronary bypass graft: Secondary | ICD-10-CM | POA: Diagnosis not present

## 2020-04-30 DIAGNOSIS — Z87891 Personal history of nicotine dependence: Secondary | ICD-10-CM | POA: Diagnosis not present

## 2020-04-30 DIAGNOSIS — Z96651 Presence of right artificial knee joint: Secondary | ICD-10-CM | POA: Diagnosis not present

## 2020-04-30 DIAGNOSIS — I2510 Atherosclerotic heart disease of native coronary artery without angina pectoris: Secondary | ICD-10-CM | POA: Diagnosis not present

## 2020-04-30 DIAGNOSIS — I361 Nonrheumatic tricuspid (valve) insufficiency: Secondary | ICD-10-CM | POA: Diagnosis not present

## 2020-04-30 DIAGNOSIS — D509 Iron deficiency anemia, unspecified: Secondary | ICD-10-CM | POA: Diagnosis not present

## 2020-04-30 DIAGNOSIS — N189 Chronic kidney disease, unspecified: Secondary | ICD-10-CM | POA: Diagnosis not present

## 2020-04-30 DIAGNOSIS — Z7982 Long term (current) use of aspirin: Secondary | ICD-10-CM | POA: Diagnosis not present

## 2020-04-30 DIAGNOSIS — Z7984 Long term (current) use of oral hypoglycemic drugs: Secondary | ICD-10-CM | POA: Diagnosis not present

## 2020-04-30 DIAGNOSIS — I340 Nonrheumatic mitral (valve) insufficiency: Secondary | ICD-10-CM | POA: Diagnosis not present

## 2020-04-30 DIAGNOSIS — H409 Unspecified glaucoma: Secondary | ICD-10-CM | POA: Diagnosis not present

## 2020-04-30 DIAGNOSIS — E1122 Type 2 diabetes mellitus with diabetic chronic kidney disease: Secondary | ICD-10-CM | POA: Diagnosis not present

## 2020-04-30 DIAGNOSIS — F0280 Dementia in other diseases classified elsewhere without behavioral disturbance: Secondary | ICD-10-CM | POA: Diagnosis not present

## 2020-04-30 DIAGNOSIS — N186 End stage renal disease: Secondary | ICD-10-CM | POA: Diagnosis not present

## 2020-04-30 DIAGNOSIS — I129 Hypertensive chronic kidney disease with stage 1 through stage 4 chronic kidney disease, or unspecified chronic kidney disease: Secondary | ICD-10-CM | POA: Diagnosis not present

## 2020-04-30 DIAGNOSIS — Z7902 Long term (current) use of antithrombotics/antiplatelets: Secondary | ICD-10-CM | POA: Diagnosis not present

## 2020-04-30 DIAGNOSIS — Z992 Dependence on renal dialysis: Secondary | ICD-10-CM | POA: Diagnosis not present

## 2020-05-01 DIAGNOSIS — Z992 Dependence on renal dialysis: Secondary | ICD-10-CM | POA: Diagnosis not present

## 2020-05-01 DIAGNOSIS — D631 Anemia in chronic kidney disease: Secondary | ICD-10-CM | POA: Diagnosis not present

## 2020-05-01 DIAGNOSIS — N186 End stage renal disease: Secondary | ICD-10-CM | POA: Diagnosis not present

## 2020-05-01 DIAGNOSIS — Z298 Encounter for other specified prophylactic measures: Secondary | ICD-10-CM | POA: Diagnosis not present

## 2020-05-01 DIAGNOSIS — D509 Iron deficiency anemia, unspecified: Secondary | ICD-10-CM | POA: Diagnosis not present

## 2020-05-02 DIAGNOSIS — D509 Iron deficiency anemia, unspecified: Secondary | ICD-10-CM | POA: Diagnosis not present

## 2020-05-02 DIAGNOSIS — Z992 Dependence on renal dialysis: Secondary | ICD-10-CM | POA: Diagnosis not present

## 2020-05-02 DIAGNOSIS — Z298 Encounter for other specified prophylactic measures: Secondary | ICD-10-CM | POA: Diagnosis not present

## 2020-05-02 DIAGNOSIS — N186 End stage renal disease: Secondary | ICD-10-CM | POA: Diagnosis not present

## 2020-05-02 DIAGNOSIS — D631 Anemia in chronic kidney disease: Secondary | ICD-10-CM | POA: Diagnosis not present

## 2020-05-03 ENCOUNTER — Telehealth: Payer: Self-pay | Admitting: Pharmacist

## 2020-05-03 DIAGNOSIS — N186 End stage renal disease: Secondary | ICD-10-CM | POA: Diagnosis not present

## 2020-05-03 DIAGNOSIS — Z298 Encounter for other specified prophylactic measures: Secondary | ICD-10-CM | POA: Diagnosis not present

## 2020-05-03 DIAGNOSIS — D509 Iron deficiency anemia, unspecified: Secondary | ICD-10-CM | POA: Diagnosis not present

## 2020-05-03 DIAGNOSIS — Z992 Dependence on renal dialysis: Secondary | ICD-10-CM | POA: Diagnosis not present

## 2020-05-03 DIAGNOSIS — D631 Anemia in chronic kidney disease: Secondary | ICD-10-CM | POA: Diagnosis not present

## 2020-05-03 NOTE — Chronic Care Management (AMB) (Signed)
Chronic Care Management Pharmacy Assistant   Name: Ray Lamb  MRN: [id number] DOB: 01/11/1931  Reason for Encounter: Disease State/Diabetes Adherence Call  Recent office visits:  n/a  Recent consult visits:  [facility] visits:  None in previous 6 months  Medications: Outpatient Encounter Medications as of 05/03/2020  Medication Sig Note  . ASPIRIN 81 PO Take by mouth every other day.    Marland Kitchen atorvastatin (LIPITOR) 80 MG tablet Take 1 tablet (80 mg total) by mouth at bedtime.   . carvedilol (COREG) 12.5 MG tablet Take by mouth 2 (two) times daily.    . clopidogrel (PLAVIX) 75 MG tablet Take 1 tablet (75 mg total) by mouth daily.   Marland Kitchen ezetimibe (ZETIA) 10 MG tablet Take 1 tablet (10 mg total) by mouth daily.   . isosorbide mononitrate (IMDUR) 30 MG 24 hr tablet Take 1.5 tablets (45 mg total) by mouth daily.   . Lancets (ONETOUCH ULTRASOFT) lancets USE TO CHECK BLOOD SUGAR TWICE A DAY   . loratadine (CLARITIN) 10 MG tablet Take 10 mg by mouth daily.   . Melatonin 10 MG TABS Take 10 mg by mouth at bedtime.   . mirtazapine (REMERON) 7.5 MG tablet Take 7.5 mg by mouth as needed.  11/23/2019: Takes rarely for appetite stimualtion  . Multiple Vitamins-Minerals (CENTRUM SILVER ULTRA MENS PO) Take by mouth daily.   Marland Kitchen nystatin (MYCOSTATIN/NYSTOP) powder Apply 1 application topically 3 (three) times daily.   . ondansetron (ZOFRAN) 4 MG tablet Take 4 mg by mouth every 8 (eight) hours as needed.   . sitaGLIPtin (JANUVIA) 25 MG tablet Take 1 tablet (25 mg total) by mouth daily.   . timolol (TIMOPTIC) 0.5 % ophthalmic solution Place 1 drop into both eyes daily.    Marland Kitchen torsemide (DEMADEX) 20 MG tablet Take 40 mg by mouth daily.   . TURMERIC PO Take 1,300 mg by mouth daily.     Facility-Administered Encounter Medications as of 05/03/2020  Medication  . sodium chloride flush (NS) 0.9 % injection 3 mL   Recent Relevant Labs: Lab Results  Component Value Date/Time   HGBA1C 7.8 02/23/2020  12:00 AM   HGBA1C 6.1 11/20/2019 12:00 AM   MICROALBUR 150 (H) 07/03/2019 11:42 AM   MICROALBUR 150 (H) 01/02/2019 11:26 AM    Kidney Function Lab Results  Component Value Date/Time   CREATININE 6.34 (H) 12/12/2019 03:11 PM   CREATININE 4.36 (H) 07/03/2019 11:45 AM   GFRNONAA 7 (L) 12/12/2019 03:11 PM   GFRAA 8 (L) 12/12/2019 03:11 PM    . Current antihyperglycemic regimen:  o Januvia 25 mg daily  . What recent interventions/DTPs have been made to improve glycemic control:  o None noted, patient states he is taking his medications as instructed.  . Have there been any recent hospitalizations or ED visits since last visit with CPP? No   . Patient denies hypoglycemic symptoms, including Sweaty, Shaky, Hungry, Nervous/irritable and Vision changes   . Patient denies hyperglycemic symptoms, including blurry vision, excessive thirst, fatigue, polyuria and weakness   . How often are you checking your blood sugar? once daily   . What are your blood sugars ranging?  o Fasting: 174 o Before meals: n/a o After meals: n/a o Bedtime: n/a  . During the week, how often does your blood glucose drop below 70? Never   . Are you checking your feet daily/regularly?  Yes, patient states he checks his feet regularly. Patient states right now he has some  swelling in his legs and feet.  Adherence Review: Is the patient currently on a STATIN medication? Yes Is the patient currently on ACE/ARB medication? No Does the patient have >5 day gap between last estimated fill dates? No  Patient states he tries to drink a boost daily. Patient states he had one today to try to get his protein up. Patient states he is currently on dialysis and his "kidney doctor" likes for him to have one regularly.  Patient states his nephrologist decreased his Carvedilol "in half to only one a day".  Future Appointments  Date Time Provider [place]  05/21/2020  9:00 AM [facility] CCM PHARMACY [facility]  05/24/2020   1:00 PM [facility] CCM SOCIAL WORK [facility]  06/15/2020 11:20 AM Valerie Roys, DO [facility]    Patient was reminded of his follow up appointment with Birdena Crandall, CPP on 05/21/2020 at 9:00 am.  Star Rating Drugs: Atorvastatin 80 mg last filled 02/22/2020 90 DS Januvia 25 mg last filled 03/28/2020 90 DS Carvedilol 6.25 mg last filled 03/31/2020 90 DS  April D Calhoun, [place] Pharmacist Assistant [phone]

## 2020-05-04 DIAGNOSIS — D631 Anemia in chronic kidney disease: Secondary | ICD-10-CM | POA: Diagnosis not present

## 2020-05-04 DIAGNOSIS — Z992 Dependence on renal dialysis: Secondary | ICD-10-CM | POA: Diagnosis not present

## 2020-05-04 DIAGNOSIS — Z298 Encounter for other specified prophylactic measures: Secondary | ICD-10-CM | POA: Diagnosis not present

## 2020-05-04 DIAGNOSIS — N186 End stage renal disease: Secondary | ICD-10-CM | POA: Diagnosis not present

## 2020-05-04 DIAGNOSIS — D509 Iron deficiency anemia, unspecified: Secondary | ICD-10-CM | POA: Diagnosis not present

## 2020-05-05 DIAGNOSIS — Z298 Encounter for other specified prophylactic measures: Secondary | ICD-10-CM | POA: Diagnosis not present

## 2020-05-05 DIAGNOSIS — D631 Anemia in chronic kidney disease: Secondary | ICD-10-CM | POA: Diagnosis not present

## 2020-05-05 DIAGNOSIS — D509 Iron deficiency anemia, unspecified: Secondary | ICD-10-CM | POA: Diagnosis not present

## 2020-05-05 DIAGNOSIS — N186 End stage renal disease: Secondary | ICD-10-CM | POA: Diagnosis not present

## 2020-05-05 DIAGNOSIS — Z992 Dependence on renal dialysis: Secondary | ICD-10-CM | POA: Diagnosis not present

## 2020-05-06 DIAGNOSIS — G301 Alzheimer's disease with late onset: Secondary | ICD-10-CM | POA: Diagnosis not present

## 2020-05-06 DIAGNOSIS — F0280 Dementia in other diseases classified elsewhere without behavioral disturbance: Secondary | ICD-10-CM | POA: Diagnosis not present

## 2020-05-06 DIAGNOSIS — N186 End stage renal disease: Secondary | ICD-10-CM | POA: Diagnosis not present

## 2020-05-06 DIAGNOSIS — D509 Iron deficiency anemia, unspecified: Secondary | ICD-10-CM | POA: Diagnosis not present

## 2020-05-06 DIAGNOSIS — Z992 Dependence on renal dialysis: Secondary | ICD-10-CM | POA: Diagnosis not present

## 2020-05-06 DIAGNOSIS — Z298 Encounter for other specified prophylactic measures: Secondary | ICD-10-CM | POA: Diagnosis not present

## 2020-05-06 DIAGNOSIS — D631 Anemia in chronic kidney disease: Secondary | ICD-10-CM | POA: Diagnosis not present

## 2020-05-06 DIAGNOSIS — N189 Chronic kidney disease, unspecified: Secondary | ICD-10-CM | POA: Diagnosis not present

## 2020-05-06 DIAGNOSIS — I2510 Atherosclerotic heart disease of native coronary artery without angina pectoris: Secondary | ICD-10-CM | POA: Diagnosis not present

## 2020-05-06 DIAGNOSIS — I129 Hypertensive chronic kidney disease with stage 1 through stage 4 chronic kidney disease, or unspecified chronic kidney disease: Secondary | ICD-10-CM | POA: Diagnosis not present

## 2020-05-06 DIAGNOSIS — E1122 Type 2 diabetes mellitus with diabetic chronic kidney disease: Secondary | ICD-10-CM | POA: Diagnosis not present

## 2020-05-07 DIAGNOSIS — F0280 Dementia in other diseases classified elsewhere without behavioral disturbance: Secondary | ICD-10-CM | POA: Diagnosis not present

## 2020-05-07 DIAGNOSIS — G301 Alzheimer's disease with late onset: Secondary | ICD-10-CM | POA: Diagnosis not present

## 2020-05-07 DIAGNOSIS — I2510 Atherosclerotic heart disease of native coronary artery without angina pectoris: Secondary | ICD-10-CM | POA: Diagnosis not present

## 2020-05-07 DIAGNOSIS — Z992 Dependence on renal dialysis: Secondary | ICD-10-CM | POA: Diagnosis not present

## 2020-05-07 DIAGNOSIS — D631 Anemia in chronic kidney disease: Secondary | ICD-10-CM | POA: Diagnosis not present

## 2020-05-07 DIAGNOSIS — I129 Hypertensive chronic kidney disease with stage 1 through stage 4 chronic kidney disease, or unspecified chronic kidney disease: Secondary | ICD-10-CM | POA: Diagnosis not present

## 2020-05-07 DIAGNOSIS — E1122 Type 2 diabetes mellitus with diabetic chronic kidney disease: Secondary | ICD-10-CM | POA: Diagnosis not present

## 2020-05-07 DIAGNOSIS — N189 Chronic kidney disease, unspecified: Secondary | ICD-10-CM | POA: Diagnosis not present

## 2020-05-07 DIAGNOSIS — D509 Iron deficiency anemia, unspecified: Secondary | ICD-10-CM | POA: Diagnosis not present

## 2020-05-07 DIAGNOSIS — N186 End stage renal disease: Secondary | ICD-10-CM | POA: Diagnosis not present

## 2020-05-07 DIAGNOSIS — Z298 Encounter for other specified prophylactic measures: Secondary | ICD-10-CM | POA: Diagnosis not present

## 2020-05-08 DIAGNOSIS — Z992 Dependence on renal dialysis: Secondary | ICD-10-CM | POA: Diagnosis not present

## 2020-05-08 DIAGNOSIS — N186 End stage renal disease: Secondary | ICD-10-CM | POA: Diagnosis not present

## 2020-05-08 DIAGNOSIS — D631 Anemia in chronic kidney disease: Secondary | ICD-10-CM | POA: Diagnosis not present

## 2020-05-08 DIAGNOSIS — D509 Iron deficiency anemia, unspecified: Secondary | ICD-10-CM | POA: Diagnosis not present

## 2020-05-08 DIAGNOSIS — Z298 Encounter for other specified prophylactic measures: Secondary | ICD-10-CM | POA: Diagnosis not present

## 2020-05-09 DIAGNOSIS — Z298 Encounter for other specified prophylactic measures: Secondary | ICD-10-CM | POA: Diagnosis not present

## 2020-05-09 DIAGNOSIS — D509 Iron deficiency anemia, unspecified: Secondary | ICD-10-CM | POA: Diagnosis not present

## 2020-05-09 DIAGNOSIS — N186 End stage renal disease: Secondary | ICD-10-CM | POA: Diagnosis not present

## 2020-05-09 DIAGNOSIS — Z992 Dependence on renal dialysis: Secondary | ICD-10-CM | POA: Diagnosis not present

## 2020-05-09 DIAGNOSIS — D631 Anemia in chronic kidney disease: Secondary | ICD-10-CM | POA: Diagnosis not present

## 2020-05-10 DIAGNOSIS — N186 End stage renal disease: Secondary | ICD-10-CM | POA: Diagnosis not present

## 2020-05-10 DIAGNOSIS — Z298 Encounter for other specified prophylactic measures: Secondary | ICD-10-CM | POA: Diagnosis not present

## 2020-05-10 DIAGNOSIS — D631 Anemia in chronic kidney disease: Secondary | ICD-10-CM | POA: Diagnosis not present

## 2020-05-10 DIAGNOSIS — D509 Iron deficiency anemia, unspecified: Secondary | ICD-10-CM | POA: Diagnosis not present

## 2020-05-10 DIAGNOSIS — Z992 Dependence on renal dialysis: Secondary | ICD-10-CM | POA: Diagnosis not present

## 2020-05-11 DIAGNOSIS — N186 End stage renal disease: Secondary | ICD-10-CM | POA: Diagnosis not present

## 2020-05-11 DIAGNOSIS — G301 Alzheimer's disease with late onset: Secondary | ICD-10-CM | POA: Diagnosis not present

## 2020-05-11 DIAGNOSIS — Z298 Encounter for other specified prophylactic measures: Secondary | ICD-10-CM | POA: Diagnosis not present

## 2020-05-11 DIAGNOSIS — E1122 Type 2 diabetes mellitus with diabetic chronic kidney disease: Secondary | ICD-10-CM | POA: Diagnosis not present

## 2020-05-11 DIAGNOSIS — D631 Anemia in chronic kidney disease: Secondary | ICD-10-CM | POA: Diagnosis not present

## 2020-05-11 DIAGNOSIS — I2510 Atherosclerotic heart disease of native coronary artery without angina pectoris: Secondary | ICD-10-CM | POA: Diagnosis not present

## 2020-05-11 DIAGNOSIS — F0280 Dementia in other diseases classified elsewhere without behavioral disturbance: Secondary | ICD-10-CM | POA: Diagnosis not present

## 2020-05-11 DIAGNOSIS — N189 Chronic kidney disease, unspecified: Secondary | ICD-10-CM | POA: Diagnosis not present

## 2020-05-11 DIAGNOSIS — I129 Hypertensive chronic kidney disease with stage 1 through stage 4 chronic kidney disease, or unspecified chronic kidney disease: Secondary | ICD-10-CM | POA: Diagnosis not present

## 2020-05-11 DIAGNOSIS — Z992 Dependence on renal dialysis: Secondary | ICD-10-CM | POA: Diagnosis not present

## 2020-05-11 DIAGNOSIS — D509 Iron deficiency anemia, unspecified: Secondary | ICD-10-CM | POA: Diagnosis not present

## 2020-05-12 DIAGNOSIS — N186 End stage renal disease: Secondary | ICD-10-CM | POA: Diagnosis not present

## 2020-05-12 DIAGNOSIS — D631 Anemia in chronic kidney disease: Secondary | ICD-10-CM | POA: Diagnosis not present

## 2020-05-12 DIAGNOSIS — D509 Iron deficiency anemia, unspecified: Secondary | ICD-10-CM | POA: Diagnosis not present

## 2020-05-12 DIAGNOSIS — Z992 Dependence on renal dialysis: Secondary | ICD-10-CM | POA: Diagnosis not present

## 2020-05-12 DIAGNOSIS — Z298 Encounter for other specified prophylactic measures: Secondary | ICD-10-CM | POA: Diagnosis not present

## 2020-05-13 DIAGNOSIS — D631 Anemia in chronic kidney disease: Secondary | ICD-10-CM | POA: Diagnosis not present

## 2020-05-13 DIAGNOSIS — N186 End stage renal disease: Secondary | ICD-10-CM | POA: Diagnosis not present

## 2020-05-13 DIAGNOSIS — Z298 Encounter for other specified prophylactic measures: Secondary | ICD-10-CM | POA: Diagnosis not present

## 2020-05-13 DIAGNOSIS — Z992 Dependence on renal dialysis: Secondary | ICD-10-CM | POA: Diagnosis not present

## 2020-05-13 DIAGNOSIS — D509 Iron deficiency anemia, unspecified: Secondary | ICD-10-CM | POA: Diagnosis not present

## 2020-05-14 DIAGNOSIS — G301 Alzheimer's disease with late onset: Secondary | ICD-10-CM | POA: Diagnosis not present

## 2020-05-14 DIAGNOSIS — D509 Iron deficiency anemia, unspecified: Secondary | ICD-10-CM | POA: Diagnosis not present

## 2020-05-14 DIAGNOSIS — E1122 Type 2 diabetes mellitus with diabetic chronic kidney disease: Secondary | ICD-10-CM | POA: Diagnosis not present

## 2020-05-14 DIAGNOSIS — Z992 Dependence on renal dialysis: Secondary | ICD-10-CM | POA: Diagnosis not present

## 2020-05-14 DIAGNOSIS — N189 Chronic kidney disease, unspecified: Secondary | ICD-10-CM | POA: Diagnosis not present

## 2020-05-14 DIAGNOSIS — Z298 Encounter for other specified prophylactic measures: Secondary | ICD-10-CM | POA: Diagnosis not present

## 2020-05-14 DIAGNOSIS — I2510 Atherosclerotic heart disease of native coronary artery without angina pectoris: Secondary | ICD-10-CM | POA: Diagnosis not present

## 2020-05-14 DIAGNOSIS — D631 Anemia in chronic kidney disease: Secondary | ICD-10-CM | POA: Diagnosis not present

## 2020-05-14 DIAGNOSIS — I129 Hypertensive chronic kidney disease with stage 1 through stage 4 chronic kidney disease, or unspecified chronic kidney disease: Secondary | ICD-10-CM | POA: Diagnosis not present

## 2020-05-14 DIAGNOSIS — N186 End stage renal disease: Secondary | ICD-10-CM | POA: Diagnosis not present

## 2020-05-14 DIAGNOSIS — N2581 Secondary hyperparathyroidism of renal origin: Secondary | ICD-10-CM | POA: Diagnosis not present

## 2020-05-14 DIAGNOSIS — F0280 Dementia in other diseases classified elsewhere without behavioral disturbance: Secondary | ICD-10-CM | POA: Diagnosis not present

## 2020-05-15 DIAGNOSIS — N2581 Secondary hyperparathyroidism of renal origin: Secondary | ICD-10-CM | POA: Diagnosis not present

## 2020-05-15 DIAGNOSIS — D631 Anemia in chronic kidney disease: Secondary | ICD-10-CM | POA: Diagnosis not present

## 2020-05-15 DIAGNOSIS — Z298 Encounter for other specified prophylactic measures: Secondary | ICD-10-CM | POA: Diagnosis not present

## 2020-05-15 DIAGNOSIS — D509 Iron deficiency anemia, unspecified: Secondary | ICD-10-CM | POA: Diagnosis not present

## 2020-05-15 DIAGNOSIS — N186 End stage renal disease: Secondary | ICD-10-CM | POA: Diagnosis not present

## 2020-05-15 DIAGNOSIS — Z992 Dependence on renal dialysis: Secondary | ICD-10-CM | POA: Diagnosis not present

## 2020-05-16 DIAGNOSIS — N186 End stage renal disease: Secondary | ICD-10-CM | POA: Diagnosis not present

## 2020-05-16 DIAGNOSIS — D631 Anemia in chronic kidney disease: Secondary | ICD-10-CM | POA: Diagnosis not present

## 2020-05-16 DIAGNOSIS — N2581 Secondary hyperparathyroidism of renal origin: Secondary | ICD-10-CM | POA: Diagnosis not present

## 2020-05-16 DIAGNOSIS — D509 Iron deficiency anemia, unspecified: Secondary | ICD-10-CM | POA: Diagnosis not present

## 2020-05-16 DIAGNOSIS — Z992 Dependence on renal dialysis: Secondary | ICD-10-CM | POA: Diagnosis not present

## 2020-05-16 DIAGNOSIS — Z298 Encounter for other specified prophylactic measures: Secondary | ICD-10-CM | POA: Diagnosis not present

## 2020-05-17 DIAGNOSIS — N186 End stage renal disease: Secondary | ICD-10-CM | POA: Diagnosis not present

## 2020-05-17 DIAGNOSIS — Z992 Dependence on renal dialysis: Secondary | ICD-10-CM | POA: Diagnosis not present

## 2020-05-17 DIAGNOSIS — D631 Anemia in chronic kidney disease: Secondary | ICD-10-CM | POA: Diagnosis not present

## 2020-05-17 DIAGNOSIS — Z298 Encounter for other specified prophylactic measures: Secondary | ICD-10-CM | POA: Diagnosis not present

## 2020-05-17 DIAGNOSIS — N2581 Secondary hyperparathyroidism of renal origin: Secondary | ICD-10-CM | POA: Diagnosis not present

## 2020-05-17 DIAGNOSIS — D509 Iron deficiency anemia, unspecified: Secondary | ICD-10-CM | POA: Diagnosis not present

## 2020-05-18 DIAGNOSIS — Z298 Encounter for other specified prophylactic measures: Secondary | ICD-10-CM | POA: Diagnosis not present

## 2020-05-18 DIAGNOSIS — D631 Anemia in chronic kidney disease: Secondary | ICD-10-CM | POA: Diagnosis not present

## 2020-05-18 DIAGNOSIS — N186 End stage renal disease: Secondary | ICD-10-CM | POA: Diagnosis not present

## 2020-05-18 DIAGNOSIS — N2581 Secondary hyperparathyroidism of renal origin: Secondary | ICD-10-CM | POA: Diagnosis not present

## 2020-05-18 DIAGNOSIS — D509 Iron deficiency anemia, unspecified: Secondary | ICD-10-CM | POA: Diagnosis not present

## 2020-05-18 DIAGNOSIS — Z992 Dependence on renal dialysis: Secondary | ICD-10-CM | POA: Diagnosis not present

## 2020-05-19 DIAGNOSIS — Z992 Dependence on renal dialysis: Secondary | ICD-10-CM | POA: Diagnosis not present

## 2020-05-19 DIAGNOSIS — D509 Iron deficiency anemia, unspecified: Secondary | ICD-10-CM | POA: Diagnosis not present

## 2020-05-19 DIAGNOSIS — D631 Anemia in chronic kidney disease: Secondary | ICD-10-CM | POA: Diagnosis not present

## 2020-05-19 DIAGNOSIS — N2581 Secondary hyperparathyroidism of renal origin: Secondary | ICD-10-CM | POA: Diagnosis not present

## 2020-05-19 DIAGNOSIS — Z298 Encounter for other specified prophylactic measures: Secondary | ICD-10-CM | POA: Diagnosis not present

## 2020-05-19 DIAGNOSIS — N186 End stage renal disease: Secondary | ICD-10-CM | POA: Diagnosis not present

## 2020-05-20 DIAGNOSIS — N2581 Secondary hyperparathyroidism of renal origin: Secondary | ICD-10-CM | POA: Diagnosis not present

## 2020-05-20 DIAGNOSIS — Z298 Encounter for other specified prophylactic measures: Secondary | ICD-10-CM | POA: Diagnosis not present

## 2020-05-20 DIAGNOSIS — D509 Iron deficiency anemia, unspecified: Secondary | ICD-10-CM | POA: Diagnosis not present

## 2020-05-20 DIAGNOSIS — N186 End stage renal disease: Secondary | ICD-10-CM | POA: Diagnosis not present

## 2020-05-20 DIAGNOSIS — D631 Anemia in chronic kidney disease: Secondary | ICD-10-CM | POA: Diagnosis not present

## 2020-05-20 DIAGNOSIS — Z992 Dependence on renal dialysis: Secondary | ICD-10-CM | POA: Diagnosis not present

## 2020-05-21 ENCOUNTER — Telehealth: Payer: Medicare Other | Admitting: Pharmacist

## 2020-05-21 DIAGNOSIS — E119 Type 2 diabetes mellitus without complications: Secondary | ICD-10-CM | POA: Diagnosis not present

## 2020-05-21 DIAGNOSIS — Z992 Dependence on renal dialysis: Secondary | ICD-10-CM | POA: Diagnosis not present

## 2020-05-21 DIAGNOSIS — N2581 Secondary hyperparathyroidism of renal origin: Secondary | ICD-10-CM | POA: Diagnosis not present

## 2020-05-21 DIAGNOSIS — N186 End stage renal disease: Secondary | ICD-10-CM | POA: Diagnosis not present

## 2020-05-21 DIAGNOSIS — D509 Iron deficiency anemia, unspecified: Secondary | ICD-10-CM | POA: Diagnosis not present

## 2020-05-21 DIAGNOSIS — Z298 Encounter for other specified prophylactic measures: Secondary | ICD-10-CM | POA: Diagnosis not present

## 2020-05-21 DIAGNOSIS — D631 Anemia in chronic kidney disease: Secondary | ICD-10-CM | POA: Diagnosis not present

## 2020-05-21 NOTE — Progress Notes (Unsigned)
Chronic Care Management Pharmacy Note  05/21/2020 Name:  Ray Lamb MRN:  [id number] DOB:  April 27, 1930  Subjective: Ray Lamb is an 87 y.o. year old male who is a primary patient of Valerie Roys, DO.  The CCM team was consulted for assistance with disease management and care coordination needs.    Engaged with patient by telephone for follow up visit in response to provider referral for pharmacy case management and/or care coordination services.   Consent to Services:  The patient was given information about Chronic Care Management services, agreed to services, and gave verbal consent prior to initiation of services.  Please see initial visit note for detailed documentation.   Patient Care Team: Valerie Roys, DO as PCP - General (Family Medicine) Murlean Iba, MD (Internal Medicine) Dionisio David, MD as Consulting Physician (Cardiology) Sharlotte Alamo, DPM (Podiatry) Ronita Hipps, Elmer Ramp., MD (Dentistry) Lucilla Lame, MD as Consulting Physician (Gastroenterology) Vladimir Crofts, MD as Consulting Physician (Neurology) Minor, Dalbert Garnet, RN (Inactive) as [place] Management Sindy Guadeloupe, MD as Consulting Physician (Oncology) Vladimir Faster, [facility] as Pharmacist (Pharmacist)  Recent office visits: ***  Recent consult visits: 3/15/22Sunday Corn, PA (neurology)- Bp 117/52, hr 88, hold mirtazipine, start sertralin 25 mg x 1 week then 50 mg qd, HH PT, ASA 81 mg qod continue  Hospital visits: {Hospital DC Yes/No:25215}  Objective:  Lab Results  Component Value Date   CREATININE 6.34 (H) 12/12/2019   BUN 69 (H) 12/12/2019   GFRNONAA 7 (L) 12/12/2019   GFRAA 8 (L) 12/12/2019   NA 137 12/12/2019   K 3.8 12/12/2019   CALCIUM 8.1 (L) 12/12/2019   CO2 25 12/12/2019   GLUCOSE 129 (H) 12/12/2019    Lab Results  Component Value Date/Time   HGBA1C 7.8 02/23/2020 12:00 AM   HGBA1C 6.1 11/20/2019 12:00 AM   MICROALBUR 150 (H) 07/03/2019 11:42 AM   MICROALBUR  150 (H) 01/02/2019 11:26 AM    Last diabetic Eye exam:  Lab Results  Component Value Date/Time   HMDIABEYEEXA No Retinopathy 03/17/2020 12:00 AM    Last diabetic Foot exam: No results found for: HMDIABFOOTEX   Lab Results  Component Value Date   CHOL 116 12/12/2019   HDL 35 (L) 12/12/2019   LDLCALC 61 12/12/2019   TRIG 110 12/12/2019   CHOLHDL 2.1 03/14/2018    Hepatic Function Latest Ref Rng & Units 12/12/2019 11/20/2019 07/03/2019  Total Protein 6.0 - 8.5 g/dL 8.1 - 7.6  Albumin 3.6 - 4.6 g/dL 3.1(L) 3.3(A) 3.2(L)  AST 0 - 40 IU/L 18 - 30  ALT 0 - 44 IU/L 11 - 22  Alk Phosphatase 44 - 121 IU/L 91 - 94  Total Bilirubin 0.0 - 1.2 mg/dL 0.2 - 0.3    Lab Results  Component Value Date/Time   TSH 3.076 01/24/2019 11:16 AM   TSH 3.380 01/02/2019 11:59 AM   TSH 3.510 03/14/2018 11:30 AM    CBC Latest Ref Rng & Units 12/12/2019 11/20/2019 07/03/2019  WBC 3.4 - 10.8 x10E3/uL 8.6 - 7.0  Hemoglobin 13.0 - 17.7 g/dL 12.5(L) 11.6(A) 11.3(L)  Hematocrit 37.5 - 51.0 % 38.1 - 34.8(L)  Platelets 150 - 450 x10E3/uL 175 - 149(L)    Lab Results  Component Value Date/Time   VD25OH 35 11/20/2019 12:00 AM    Clinical ASCVD: {YES/NO:21197} The ASCVD Risk score Mikey Bussing DC Jr., et al., 2013) failed to calculate for the following reasons:   The 2013 ASCVD risk  score is only valid for ages 89 to 71   The patient has a prior MI or stroke diagnosis    Depression screen [facility] 2/9 03/15/2020 02/20/2019 02/14/2018  Decreased Interest 0 0 0  Down, Depressed, Hopeless 0 0 0  PHQ - 2 Score 0 0 0  Altered sleeping - - -  Tired, decreased energy - - -  Change in appetite - - -  Feeling bad or failure about yourself  - - -  Trouble concentrating - - -  Moving slowly or fidgety/restless - - -  Suicidal thoughts - - -  PHQ-9 Score - - -  Difficult doing work/chores - - -     ***Other: (CHADS2VASc if Afib, MMRC or CAT for COPD, ACT, DEXA)  Social History   Tobacco Use  Smoking Status Former Smoker   . Quit date: 09/02/1964  . Years since quitting: 55.7  Smokeless Tobacco Never Used   BP Readings from Last 3 Encounters:  03/15/20 133/75  12/12/19 120/72  07/16/19 137/72   Pulse Readings from Last 3 Encounters:  03/15/20 (!) 101  12/12/19 76  07/16/19 60   Wt Readings from Last 3 Encounters:  03/15/20 140 lb (63.5 kg)  12/12/19 125 lb 9.6 oz (57 kg)  07/03/19 137 lb 3.2 oz (62.2 kg)   BMI Readings from Last 3 Encounters:  03/15/20 23.90 kg/m  12/12/19 21.44 kg/m  07/03/19 23.42 kg/m    Assessment/Interventions: Review of patient past medical history, allergies, medications, health status, including review of consultants reports, laboratory and other test data, was performed as part of comprehensive evaluation and provision of chronic care management services.   SDOH:  (Social Determinants of Health) assessments and interventions performed: {yes/no:20286}  SDOH Screenings   Alcohol Screen: Not on file  Depression (PHQ2-9): Low Risk   . PHQ-2 Score: 0  Financial Resource Strain: Not on file  Food Insecurity: Not on file  Housing: Not on file  Physical Activity: Not on file  Social Connections: Not on file  Stress: Not on file  Tobacco Use: Medium Risk  . Smoking Tobacco Use: Former Smoker  . Smokeless Tobacco Use: Never Used  Transportation Needs: Not on file    CCM Care Plan  Allergies  Allergen Reactions  . Bee Venom Hives  . Iodinated Diagnostic Agents Rash  . Metrizamide Rash  . Other Rash and Other (See Comments)    Dye    Medications Reviewed Today    Reviewed by Valerie Roys, DO (Physician) on 03/15/20 at 1445  Med List Status: <None>  Medication Order Taking? Sig Documenting Provider Last Dose Status Informant  ASPIRIN 81 PO [id number] No Take by mouth every other day.  [provider] Taking Active Self  atorvastatin (LIPITOR) 80 MG tablet [id number]  Take 1 tablet (80 mg total) by mouth at bedtime. Johnson, Megan P, DO  Active    carvedilol (COREG) 12.5 MG tablet [id number] No Take by mouth 2 (two) times daily.  [provider] Taking Active   clopidogrel (PLAVIX) 75 MG tablet [id number] No Take 1 tablet (75 mg total) by mouth daily. Park Liter P, DO Taking Active   ezetimibe (ZETIA) 10 MG tablet [id number]  Take 1 tablet (10 mg total) by mouth daily. Johnson, Megan P, DO  Active   isosorbide mononitrate (IMDUR) 30 MG 24 hr tablet [id number]  Take 1.5 tablets (45 mg total) by mouth daily. Johnson, Megan P, DO  Active   Lancets [facility] ULTRASOFT) lancets [id number] No USE  TO CHECK BLOOD SUGAR TWICE A DAY Johnson, Megan P, DO Taking Active   loratadine (CLARITIN) 10 MG tablet [id number]  Take 10 mg by mouth daily. [provider]  Active   Melatonin 10 MG TABS [id number] No Take 10 mg by mouth at bedtime. [provider] Taking Active   mirtazapine (REMERON) 7.5 MG tablet [id number] No Take 7.5 mg by mouth as needed.  [provider] Taking Active            Med Note Kenton Kingfisher, Gailen Shelter Nov 23, 2019 10:33 PM) Takes rarely for appetite stimualtion  Multiple Vitamins-Minerals (CENTRUM SILVER ULTRA MENS PO) [id number] No Take by mouth daily. [provider] Taking Active Self  nystatin (MYCOSTATIN/NYSTOP) powder [id number]  Apply 1 application topically 3 (three) times daily. Johnson, Megan P, DO  Active   ondansetron (ZOFRAN) 4 MG tablet [id number] No Take 4 mg by mouth every 8 (eight) hours as needed. [provider] Taking Active   sitaGLIPtin (JANUVIA) 25 MG tablet [id number]  Take 1 tablet (25 mg total) by mouth daily. Wynetta Emery, Megan P, DO  Active   sodium chloride flush (NS) 0.9 % injection 3 mL [id number]   Jake Bathe, FNP  Active   timolol (TIMOPTIC) 0.5 % ophthalmic solution [id number] No Place 1 drop into both eyes daily.  [provider] Taking Active Nursing Home Medication Administration Guide (MAG)  torsemide (DEMADEX) 20 MG tablet [id number] No Take  40 mg by mouth daily. [provider] Taking Active   TURMERIC PO [id number] No Take 1,300 mg by mouth daily.  [provider] Taking Active Nursing Home Medication Administration Guide ([place])  Med List Note Jaymes Graff 10/29/17 7209): [facility] [place], [place]          Patient Active Problem List   Diagnosis Date Noted  . Anemia of chronic renal failure 01/27/2019  . ESRD (end stage renal disease) ([place]) 01/17/2019  . 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 Insomnia 03/14/2018  . Angina pectoris ([place]) 12/05/2017  . Chronic systolic (congestive) heart failure ([place]) 10/16/2017  . History of non-ST elevation myocardial infarction (NSTEMI)   . Palliative care by specialist   . CAD (coronary artery disease) 09/04/2017  . Chronic kidney disease 09/04/2017  . Pulmonary HTN ([place]) 09/04/2017  . Hiatal hernia 09/04/2017  . Mixed hyperlipidemia 09/04/2017  . Benign hypertension with CKD (chronic kidney disease) stage IV ([place]) 09/04/2017  . Mitral insufficiency 06/05/2017  . Alzheimer's dementia ([place]) 12/11/2016  . Advance care planning 05/03/2016  . Gastritis without bleeding   . Duodenitis   . Senile purpura ([place]) 12/03/2014  . Diabetes ([place]) 09/03/2014    Immunization History  Administered Date(s) Administered  . Fluad Quad(high Dose 65+) 11/27/2018  . Influenza, High Dose Seasonal PF 11/26/2015, 12/11/2016, 12/05/2017, 11/12/2019  . Influenza,inj,Quad PF,6+ Mos 12/03/2014  . Influenza-Unspecified 11/12/2013, 10/15/2018  . Moderna Sars-Covid-2 Vaccination 04/01/2019, 04/29/2019  . PPD Test 10/05/2017  . Pneumococcal Conjugate-13 09/03/2013  . Pneumococcal Polysaccharide-23 10/15/1995, 11/13/2000  . Pneumococcal-Unspecified 10/15/2018  . Td 08/08/2004, 10/06/2015  . Zoster 02/26/2006    Conditions to be addressed/monitored:  {USCCMDZASSESSMENTOPTIONS:23563}  There are no care  plans that you recently modified to display for this patient.    Medication Assistance: {MEDASSISTANCEINFO:25044}  Patient's [facility] pharmacy [facility]  [facility] [place]Phone: [phone]: [phone] [facility]  [place] [place] [address] [place] [address] Phone: [phone] Fax: [phone]  Uses pill box? {Yes or If no, why not?:20788} Pt endorses ***% compliance  We discussed: {Pharmacy options:24294} Patient decided to: {US Pharmacy Plan:23885}  Care Plan and Follow Up Patient Decision:  {FOLLOWUP:24991}  Plan: {CM FOLLOW UP PLAN:25073}  ***

## 2020-05-22 DIAGNOSIS — Z298 Encounter for other specified prophylactic measures: Secondary | ICD-10-CM | POA: Diagnosis not present

## 2020-05-22 DIAGNOSIS — N186 End stage renal disease: Secondary | ICD-10-CM | POA: Diagnosis not present

## 2020-05-22 DIAGNOSIS — D509 Iron deficiency anemia, unspecified: Secondary | ICD-10-CM | POA: Diagnosis not present

## 2020-05-22 DIAGNOSIS — D631 Anemia in chronic kidney disease: Secondary | ICD-10-CM | POA: Diagnosis not present

## 2020-05-22 DIAGNOSIS — Z992 Dependence on renal dialysis: Secondary | ICD-10-CM | POA: Diagnosis not present

## 2020-05-22 DIAGNOSIS — N2581 Secondary hyperparathyroidism of renal origin: Secondary | ICD-10-CM | POA: Diagnosis not present

## 2020-05-23 DIAGNOSIS — N186 End stage renal disease: Secondary | ICD-10-CM | POA: Diagnosis not present

## 2020-05-23 DIAGNOSIS — Z992 Dependence on renal dialysis: Secondary | ICD-10-CM | POA: Diagnosis not present

## 2020-05-23 DIAGNOSIS — Z298 Encounter for other specified prophylactic measures: Secondary | ICD-10-CM | POA: Diagnosis not present

## 2020-05-23 DIAGNOSIS — D631 Anemia in chronic kidney disease: Secondary | ICD-10-CM | POA: Diagnosis not present

## 2020-05-23 DIAGNOSIS — D509 Iron deficiency anemia, unspecified: Secondary | ICD-10-CM | POA: Diagnosis not present

## 2020-05-23 DIAGNOSIS — N2581 Secondary hyperparathyroidism of renal origin: Secondary | ICD-10-CM | POA: Diagnosis not present

## 2020-05-24 ENCOUNTER — Ambulatory Visit (INDEPENDENT_AMBULATORY_CARE_PROVIDER_SITE_OTHER): Payer: Medicare Other | Admitting: Licensed Clinical Social Worker

## 2020-05-24 DIAGNOSIS — N184 Chronic kidney disease, stage 4 (severe): Secondary | ICD-10-CM

## 2020-05-24 DIAGNOSIS — N185 Chronic kidney disease, stage 5: Secondary | ICD-10-CM

## 2020-05-24 DIAGNOSIS — Z298 Encounter for other specified prophylactic measures: Secondary | ICD-10-CM | POA: Diagnosis not present

## 2020-05-24 DIAGNOSIS — N2581 Secondary hyperparathyroidism of renal origin: Secondary | ICD-10-CM | POA: Diagnosis not present

## 2020-05-24 DIAGNOSIS — N186 End stage renal disease: Secondary | ICD-10-CM | POA: Diagnosis not present

## 2020-05-24 DIAGNOSIS — I129 Hypertensive chronic kidney disease with stage 1 through stage 4 chronic kidney disease, or unspecified chronic kidney disease: Secondary | ICD-10-CM | POA: Diagnosis not present

## 2020-05-24 DIAGNOSIS — I5022 Chronic systolic (congestive) heart failure: Secondary | ICD-10-CM

## 2020-05-24 DIAGNOSIS — D631 Anemia in chronic kidney disease: Secondary | ICD-10-CM | POA: Diagnosis not present

## 2020-05-24 DIAGNOSIS — Z992 Dependence on renal dialysis: Secondary | ICD-10-CM | POA: Diagnosis not present

## 2020-05-24 DIAGNOSIS — D509 Iron deficiency anemia, unspecified: Secondary | ICD-10-CM | POA: Diagnosis not present

## 2020-05-24 DIAGNOSIS — E1122 Type 2 diabetes mellitus with diabetic chronic kidney disease: Secondary | ICD-10-CM

## 2020-05-24 DIAGNOSIS — I10 Essential (primary) hypertension: Secondary | ICD-10-CM

## 2020-05-24 NOTE — Patient Instructions (Signed)
Licensed Clinical Social Worker Visit Information  Goals we discussed today:  Goals Addressed              This Visit's Progress   .  SW- "I will benefit from follow up calls, education and support." (pt-stated)         Timeframe:  Long-Range Goal Priority:  Medium  Start Date:  03/29/20                         Expected End Date:  05/24/20                    Follow Up Date- 05/24/20   CARE PLAN ENTRY (see longitudinal plan of care for additional care plan information)  Current Barriers:  . Level of care concerns . ADL IADL limitations . Social Isolation . Inability to perform ADL's independently . Inability to perform IADL's independently  Clinical Social Work Clinical Goal(s):   Marland Kitchen Over the next 120 days, patient/caregiver will work with SW to address concerns related to care coordination needs and lack of education/support/resource connection. LCSW will assist patient in gaining community resource education and additional support and resource connection as well in order to maintain health and mental health appropriately  . Over the next 120 days, patient will demonstrate improved adherence to self care as evidenced by implementing healthy self-care into his daily routine such as: attending all medical appointments, deep breathing exercises, taking time for self-reflection, taking medications as prescribed, drinking water and daily exercise to improve mobility and mood.  . Over the next 120 days, patient will demonstrate improved health management independence as evidenced by implementing healthy self-care skills and positive support/resources into his daily routine to help cope with stressors and improve overall health and well-being  . Over the next 120 days, patient or caregiver will verbalize basic understanding of depression/stress process and self health management plan as evidenced by her participation in development of long term plan of care and institution of self health  management strategies  Interventions: . Inter-disciplinary care team collaboration (see longitudinal plan of care) . Patient interviewed and appropriate assessments performed . Patient reports that he tries to get socialization where he can. Patient was able to travel to the beach with his spouse, son and daughter in law in the summer of 2021 which provided him activity and socialization.  . Patient lives with spouse in their home and they both assist one another with various needs.Patient reports that he is receiving at home dialysis treatment. Patient was recently set up with a nutritionist through his dialysis physician. He reports that this has helped to improve his eating habits, mobility and strength. He reports that he is on a low salt diet and sees his nutritionist regularly. Update 05/24/20- Patient's kidney physician prescribed an appetite medication for patient to help improve his eating habits but spouse and patient report that his appetite remains low. Patient reports that he is able to eat breakfast usually as this is his favorite meal. He reports that he ate grits, eggs, peanut butter and jelly toast this morning. He shares that her drinks boost protein drinks regularly as well. CCM LCSW will update PCP.  Marland Kitchen Provided mental health counseling with regard to acceptance. Patient is having a difficulty time adjusting to his new way of life and that he is unable to do what he once could, due to health reasons. Extensive emotional support provided to patient. Patient confirms having a  very strong support network that he can lean on whenever he is struggling with this specific concern. He declines wanting mental health support implementation at this time. . Provided patient with information about available in home support resources, day programs and senior resources/senior centers. Patient denies needing these resources at this time. Patient denies needing HH referral at this time. Patient and spouse  will hire in home aide to assist with house work and light cleaning if needed. Patient's son and grandson live locally and are able to provide assistance when needed. Patient's grandson is the primary caretaker but their son is his secondary back-up as he lives further away in [facility].  . Patient and spouse deny any recent falls  . LCSW provided patient with [employer]'s number as his spouse has an upcoming appointment at [facility] and they will need a stable transportation to and from this.  . Discussed plans with patient for ongoing care management follow up and provided patient with direct contact information for care management team . Advised patient to contact CCM program or CFP front desk for any case management needs that arise.  . Assisted patient/caregiver with obtaining information about health plan benefits . Provided education and assistance to client regarding Advanced Directives. . Provided education to patient/caregiver regarding level of care options. . Provided education to patient/caregiver about Hospice and/or Palliative Care services . Patient reports that he continues to take his Remeron medication regularly at night. He reports that his sleeping habits have improved and there are some nights that he can go to sleep without taking his melatonin.  . Patient attended his neurologist on 04/27/20. He reports that this visit went well. His neurologist made a referral for [facility] PT and these services have started.  . Patient is agreeable to social work discharge at this time due to not having any current social work related concerns. However, patient will contact practice if any future social work needs arise to restart social work involvement.   Patient Self Care Activities:  . Patient verbalizes understanding of plan to contact CFP or CCM team with any case management needs or concerns  . Attends all scheduled provider appointments . Calls provider office for new concerns or questions . Lacks  social connections . Unable to perform ADLs independently . Unable to perform IADLs independently  Please see past updates related to this goal by clicking on the "Past Updates" button in the selected goal         Eula Fried, [place], MSW, [email or web address] Phone: [phone]

## 2020-05-24 NOTE — Chronic Care Management (AMB) (Signed)
Chronic Care Management    Clinical Social Work Note  05/24/2020 Name: Ray Lamb MRN: [id number] DOB: 08-Feb-1931  EYOEL THROGMORTON is a 87 y.o. year old male who is a primary care patient of Valerie Roys, DO. The CCM team was consulted to assist the patient with chronic disease management and/or care coordination needs related to: Level of Care Concerns and Mental Health Counseling and Resources.   Engaged with patient by telephone for follow up visit in response to provider referral for social work chronic care management and care coordination services.   Consent to Services:  The patient was given the following information about Chronic Care Management services today, agreed to services, and gave verbal consent: 1. CCM service includes personalized support from designated clinical staff supervised by the primary care provider, including individualized plan of care and coordination with other care providers 2. 24/7 contact phone numbers for assistance for urgent and routine care needs. 3. Service will only be billed when office clinical staff spend 20 minutes or more in a month to coordinate care. 4. Only one practitioner may furnish and bill the service in a calendar month. 5.The patient may stop CCM services at any time (effective at the end of the month) by phone call to the office staff. 6. The patient will be responsible for cost sharing (co-pay) of up to 20% of the service fee (after annual deductible is met). Patient agreed to services and consent obtained.  Patient agreed to services and consent obtained.   Assessment: Review of patient past medical history, allergies, medications, and health status, including review of relevant consultants reports was performed today as part of a comprehensive evaluation and provision of chronic care management and care coordination services.     SDOH (Social Determinants of Health) assessments and interventions performed:    Advanced Directives  Status: See Care Plan for related entries.  CCM Care Plan  Allergies  Allergen Reactions  . Bee Venom Hives  . Iodinated Diagnostic Agents Rash  . Metrizamide Rash  . Other Rash and Other (See Comments)    Dye    Outpatient Encounter Medications as of 05/24/2020  Medication Sig Note  . ASPIRIN 81 PO Take by mouth every other day.    Marland Kitchen atorvastatin (LIPITOR) 80 MG tablet Take 1 tablet (80 mg total) by mouth at bedtime.   . carvedilol (COREG) 12.5 MG tablet Take by mouth 2 (two) times daily.    . clopidogrel (PLAVIX) 75 MG tablet Take 1 tablet (75 mg total) by mouth daily.   Marland Kitchen ezetimibe (ZETIA) 10 MG tablet Take 1 tablet (10 mg total) by mouth daily.   . isosorbide mononitrate (IMDUR) 30 MG 24 hr tablet Take 1.5 tablets (45 mg total) by mouth daily.   . Lancets (ONETOUCH ULTRASOFT) lancets USE TO CHECK BLOOD SUGAR TWICE A DAY   . loratadine (CLARITIN) 10 MG tablet Take 10 mg by mouth daily.   . Melatonin 10 MG TABS Take 10 mg by mouth at bedtime.   . mirtazapine (REMERON) 7.5 MG tablet Take 7.5 mg by mouth as needed.  11/23/2019: Takes rarely for appetite stimualtion  . Multiple Vitamins-Minerals (CENTRUM SILVER ULTRA MENS PO) Take by mouth daily.   Marland Kitchen nystatin (MYCOSTATIN/NYSTOP) powder Apply 1 application topically 3 (three) times daily.   . ondansetron (ZOFRAN) 4 MG tablet Take 4 mg by mouth every 8 (eight) hours as needed.   . sitaGLIPtin (JANUVIA) 25 MG tablet Take 1 tablet (25 mg total) by mouth  daily.   . timolol (TIMOPTIC) 0.5 % ophthalmic solution Place 1 drop into both eyes daily.    Marland Kitchen torsemide (DEMADEX) 20 MG tablet Take 40 mg by mouth daily.   . TURMERIC PO Take 1,300 mg by mouth daily.     Facility-Administered Encounter Medications as of 05/24/2020  Medication  . sodium chloride flush (NS) 0.9 % injection 3 mL    Patient Active Problem List   Diagnosis Date Noted  . Anemia of chronic renal failure 01/27/2019  . ESRD (end stage renal disease) ([place]) 01/17/2019  .  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 Insomnia 03/14/2018  . Angina pectoris ([place]) 12/05/2017  . Chronic systolic (congestive) heart failure ([place]) 10/16/2017  . History of non-ST elevation myocardial infarction (NSTEMI)   . Palliative care by specialist   . CAD (coronary artery disease) 09/04/2017  . Chronic kidney disease 09/04/2017  . Pulmonary HTN ([place]) 09/04/2017  . Hiatal hernia 09/04/2017  . Mixed hyperlipidemia 09/04/2017  . Benign hypertension with CKD (chronic kidney disease) stage IV ([place]) 09/04/2017  . Mitral insufficiency 06/05/2017  . Alzheimer's dementia ([place]) 12/11/2016  . Advance care planning 05/03/2016  . Gastritis without bleeding   . Duodenitis   . Senile purpura ([place]) 12/03/2014  . Diabetes ([place]) 09/03/2014    Conditions to be addressed/monitored: Anxiety; Mental Health Concerns   Care Plan : General Social Work (Adult)  Updates made by Greg Cutter, LCSW since 05/24/2020 12:00 AM    Problem: Quality of Life (General Plan of Care)     Long-Range Goal: Quality of Life Maintained Completed 05/24/2020  Start Date: 03/29/2020  Priority: Medium  Note:    Timeframe:  Long-Range Goal Priority:  Medium  Start Date:  03/29/20                         Expected End Date:  05/24/20                    Follow Up Date- 05/24/20   CARE PLAN ENTRY (see longitudinal plan of care for additional care plan information)  Current Barriers:  . Level of care concerns . ADL IADL limitations . Social Isolation . Inability to perform ADL's independently . Inability to perform IADL's independently  Clinical Social Work Clinical Goal(s):   Marland Kitchen Over the next 120 days, patient/caregiver will work with SW to address concerns related to care coordination needs and lack of education/support/resource connection. LCSW will assist patient in gaining community resource education and additional support and resource connection as well in  order to maintain health and mental health appropriately  . Over the next 120 days, patient will demonstrate improved adherence to self care as evidenced by implementing healthy self-care into his daily routine such as: attending all medical appointments, deep breathing exercises, taking time for self-reflection, taking medications as prescribed, drinking water and daily exercise to improve mobility and mood.  . Over the next 120 days, patient will demonstrate improved health management independence as evidenced by implementing healthy self-care skills and positive support/resources into his daily routine to help cope with stressors and improve overall health and well-being  . Over the next 120 days, patient or caregiver will verbalize basic understanding of depression/stress process and self health management plan as evidenced by her participation in development of long term plan of care and institution of self health management strategies  Interventions: . Inter-disciplinary care team collaboration (see longitudinal  plan of care) . Patient interviewed and appropriate assessments performed . Patient reports that he tries to get socialization where he can. Patient was able to travel to the beach with his spouse, son and daughter in law in the summer of 2021 which provided him activity and socialization.  . Patient lives with spouse in their home and they both assist one another with various needs.Patient reports that he is receiving at home dialysis treatment. Patient was recently set up with a nutritionist through his dialysis physician. He reports that this has helped to improve his eating habits, mobility and strength. He reports that he is on a low salt diet and sees his nutritionist regularly. Update 05/24/20- Patient's kidney physician prescribed an appetite medication for patient to help improve his eating habits but spouse and patient report that his appetite remains low. Patient reports that he is  able to eat breakfast usually as this is his favorite meal. He reports that he ate grits, eggs, peanut butter and jelly toast this morning. He shares that her drinks boost protein drinks regularly as well. CCM LCSW will update PCP.  Marland Kitchen Provided mental health counseling with regard to acceptance. Patient is having a difficulty time adjusting to his new way of life and that he is unable to do what he once could, due to health reasons. Extensive emotional support provided to patient. Patient confirms having a very strong support network that he can lean on whenever he is struggling with this specific concern. He declines wanting mental health support implementation at this time. . Provided patient with information about available in home support resources, day programs and senior resources/senior centers. Patient denies needing these resources at this time. Patient denies needing HH referral at this time. Patient and spouse will hire in home aide to assist with house work and light cleaning if needed. Patient's son and grandson live locally and are able to provide assistance when needed. Patient's grandson is the primary caretaker but their son is his secondary back-up as he lives further away in [place].  . Patient is currently working with a Education officer, museum at his endocrinologist. . Patient and spouse deny any recent falls  . LCSW provided patient with ATCA's number as his spouse has an upcoming appointment at [facility] and they will need a stable transportation to and from this.  . Discussed plans with patient for ongoing care management follow up and provided patient with direct contact information for care management team . Advised patient to contact CCM program or CFP front desk for any case management needs that arise.  . Assisted patient/caregiver with obtaining information about health plan benefits . Provided education and assistance to client regarding Advanced Directives. . Provided education to  patient/caregiver regarding level of care options. . Provided education to patient/caregiver about Hospice and/or Palliative Care services . Patient reports that he continues to take his Remeron medication regularly at night. He reports that his sleeping habits have improved and there are some nights that he can go to sleep without taking his melatonin.  . Patient attended his neurologist on 04/27/20. He reports that this visit went well. His neurologist made a referral for [facility] PT and these services have started.  . Patient is agreeable to social work discharge at this time due to not having any current social work related concerns. However, patient will contact practice if any future social work needs arise to restart social work involvement.   Patient Self Care Activities:  . Patient verbalizes understanding  of plan to contact CFP or CCM team with any case management needs or concerns  . Attends all scheduled provider appointments . Calls provider office for new concerns or questions . Lacks social connections . Unable to perform ADLs independently . Unable to perform IADLs independently  Please see past updates related to this goal by clicking on the "Past Updates" button in the selected goal    Task: Support and Maintain Acceptable Degree of Health, Comfort and Happiness Completed 05/24/2020  Note:   Care Management Activities:    - affirmation provided - community involvement promoted - expression of thoughts about present/future encouraged - independence in all possible areas promoted - life review by storytelling encouraged - patient strengths promoted - psychosocial concerns monitored - self-expression encouraged - sleep diary encouraged - sleep hygiene techniques encouraged - social relationships promoted - strategies to maintain hearing and/or vision promoted - strategies to maintain intimacy promoted - wellness behaviors promoted    Notes:       Follow Up Plan: No  follow up at this time. CCM LCSW completed case closure on 05/24/20 for social work.       Eula Fried, BSW, MSW, [facility] [email or web address] Phone: [phone]

## 2020-05-25 DIAGNOSIS — D509 Iron deficiency anemia, unspecified: Secondary | ICD-10-CM | POA: Diagnosis not present

## 2020-05-25 DIAGNOSIS — N2581 Secondary hyperparathyroidism of renal origin: Secondary | ICD-10-CM | POA: Diagnosis not present

## 2020-05-25 DIAGNOSIS — Z992 Dependence on renal dialysis: Secondary | ICD-10-CM | POA: Diagnosis not present

## 2020-05-25 DIAGNOSIS — Z298 Encounter for other specified prophylactic measures: Secondary | ICD-10-CM | POA: Diagnosis not present

## 2020-05-25 DIAGNOSIS — N186 End stage renal disease: Secondary | ICD-10-CM | POA: Diagnosis not present

## 2020-05-25 DIAGNOSIS — D631 Anemia in chronic kidney disease: Secondary | ICD-10-CM | POA: Diagnosis not present

## 2020-05-26 DIAGNOSIS — D631 Anemia in chronic kidney disease: Secondary | ICD-10-CM | POA: Diagnosis not present

## 2020-05-26 DIAGNOSIS — N2581 Secondary hyperparathyroidism of renal origin: Secondary | ICD-10-CM | POA: Diagnosis not present

## 2020-05-26 DIAGNOSIS — Z298 Encounter for other specified prophylactic measures: Secondary | ICD-10-CM | POA: Diagnosis not present

## 2020-05-26 DIAGNOSIS — Z992 Dependence on renal dialysis: Secondary | ICD-10-CM | POA: Diagnosis not present

## 2020-05-26 DIAGNOSIS — N186 End stage renal disease: Secondary | ICD-10-CM | POA: Diagnosis not present

## 2020-05-26 DIAGNOSIS — D509 Iron deficiency anemia, unspecified: Secondary | ICD-10-CM | POA: Diagnosis not present

## 2020-05-27 DIAGNOSIS — Z298 Encounter for other specified prophylactic measures: Secondary | ICD-10-CM | POA: Diagnosis not present

## 2020-05-27 DIAGNOSIS — N186 End stage renal disease: Secondary | ICD-10-CM | POA: Diagnosis not present

## 2020-05-27 DIAGNOSIS — D509 Iron deficiency anemia, unspecified: Secondary | ICD-10-CM | POA: Diagnosis not present

## 2020-05-27 DIAGNOSIS — D631 Anemia in chronic kidney disease: Secondary | ICD-10-CM | POA: Diagnosis not present

## 2020-05-27 DIAGNOSIS — Z992 Dependence on renal dialysis: Secondary | ICD-10-CM | POA: Diagnosis not present

## 2020-05-27 DIAGNOSIS — N2581 Secondary hyperparathyroidism of renal origin: Secondary | ICD-10-CM | POA: Diagnosis not present

## 2020-05-28 DIAGNOSIS — F0280 Dementia in other diseases classified elsewhere without behavioral disturbance: Secondary | ICD-10-CM | POA: Diagnosis not present

## 2020-05-28 DIAGNOSIS — D509 Iron deficiency anemia, unspecified: Secondary | ICD-10-CM | POA: Diagnosis not present

## 2020-05-28 DIAGNOSIS — I2510 Atherosclerotic heart disease of native coronary artery without angina pectoris: Secondary | ICD-10-CM | POA: Diagnosis not present

## 2020-05-28 DIAGNOSIS — Z992 Dependence on renal dialysis: Secondary | ICD-10-CM | POA: Diagnosis not present

## 2020-05-28 DIAGNOSIS — I129 Hypertensive chronic kidney disease with stage 1 through stage 4 chronic kidney disease, or unspecified chronic kidney disease: Secondary | ICD-10-CM | POA: Diagnosis not present

## 2020-05-28 DIAGNOSIS — D631 Anemia in chronic kidney disease: Secondary | ICD-10-CM | POA: Diagnosis not present

## 2020-05-28 DIAGNOSIS — N189 Chronic kidney disease, unspecified: Secondary | ICD-10-CM | POA: Diagnosis not present

## 2020-05-28 DIAGNOSIS — E1122 Type 2 diabetes mellitus with diabetic chronic kidney disease: Secondary | ICD-10-CM | POA: Diagnosis not present

## 2020-05-28 DIAGNOSIS — Z298 Encounter for other specified prophylactic measures: Secondary | ICD-10-CM | POA: Diagnosis not present

## 2020-05-28 DIAGNOSIS — N2581 Secondary hyperparathyroidism of renal origin: Secondary | ICD-10-CM | POA: Diagnosis not present

## 2020-05-28 DIAGNOSIS — N186 End stage renal disease: Secondary | ICD-10-CM | POA: Diagnosis not present

## 2020-05-28 DIAGNOSIS — G301 Alzheimer's disease with late onset: Secondary | ICD-10-CM | POA: Diagnosis not present

## 2020-05-29 DIAGNOSIS — D509 Iron deficiency anemia, unspecified: Secondary | ICD-10-CM | POA: Diagnosis not present

## 2020-05-29 DIAGNOSIS — N2581 Secondary hyperparathyroidism of renal origin: Secondary | ICD-10-CM | POA: Diagnosis not present

## 2020-05-29 DIAGNOSIS — N186 End stage renal disease: Secondary | ICD-10-CM | POA: Diagnosis not present

## 2020-05-29 DIAGNOSIS — D631 Anemia in chronic kidney disease: Secondary | ICD-10-CM | POA: Diagnosis not present

## 2020-05-29 DIAGNOSIS — Z298 Encounter for other specified prophylactic measures: Secondary | ICD-10-CM | POA: Diagnosis not present

## 2020-05-29 DIAGNOSIS — Z992 Dependence on renal dialysis: Secondary | ICD-10-CM | POA: Diagnosis not present

## 2020-05-30 DIAGNOSIS — Z951 Presence of aortocoronary bypass graft: Secondary | ICD-10-CM | POA: Diagnosis not present

## 2020-05-30 DIAGNOSIS — D631 Anemia in chronic kidney disease: Secondary | ICD-10-CM | POA: Diagnosis not present

## 2020-05-30 DIAGNOSIS — Z992 Dependence on renal dialysis: Secondary | ICD-10-CM | POA: Diagnosis not present

## 2020-05-30 DIAGNOSIS — E1122 Type 2 diabetes mellitus with diabetic chronic kidney disease: Secondary | ICD-10-CM | POA: Diagnosis not present

## 2020-05-30 DIAGNOSIS — Z7984 Long term (current) use of oral hypoglycemic drugs: Secondary | ICD-10-CM | POA: Diagnosis not present

## 2020-05-30 DIAGNOSIS — N2581 Secondary hyperparathyroidism of renal origin: Secondary | ICD-10-CM | POA: Diagnosis not present

## 2020-05-30 DIAGNOSIS — I340 Nonrheumatic mitral (valve) insufficiency: Secondary | ICD-10-CM | POA: Diagnosis not present

## 2020-05-30 DIAGNOSIS — Z298 Encounter for other specified prophylactic measures: Secondary | ICD-10-CM | POA: Diagnosis not present

## 2020-05-30 DIAGNOSIS — Z87891 Personal history of nicotine dependence: Secondary | ICD-10-CM | POA: Diagnosis not present

## 2020-05-30 DIAGNOSIS — Z7982 Long term (current) use of aspirin: Secondary | ICD-10-CM | POA: Diagnosis not present

## 2020-05-30 DIAGNOSIS — N189 Chronic kidney disease, unspecified: Secondary | ICD-10-CM | POA: Diagnosis not present

## 2020-05-30 DIAGNOSIS — I129 Hypertensive chronic kidney disease with stage 1 through stage 4 chronic kidney disease, or unspecified chronic kidney disease: Secondary | ICD-10-CM | POA: Diagnosis not present

## 2020-05-30 DIAGNOSIS — F0280 Dementia in other diseases classified elsewhere without behavioral disturbance: Secondary | ICD-10-CM | POA: Diagnosis not present

## 2020-05-30 DIAGNOSIS — Z96651 Presence of right artificial knee joint: Secondary | ICD-10-CM | POA: Diagnosis not present

## 2020-05-30 DIAGNOSIS — N186 End stage renal disease: Secondary | ICD-10-CM | POA: Diagnosis not present

## 2020-05-30 DIAGNOSIS — G301 Alzheimer's disease with late onset: Secondary | ICD-10-CM | POA: Diagnosis not present

## 2020-05-30 DIAGNOSIS — I2510 Atherosclerotic heart disease of native coronary artery without angina pectoris: Secondary | ICD-10-CM | POA: Diagnosis not present

## 2020-05-30 DIAGNOSIS — H409 Unspecified glaucoma: Secondary | ICD-10-CM | POA: Diagnosis not present

## 2020-05-30 DIAGNOSIS — Z7902 Long term (current) use of antithrombotics/antiplatelets: Secondary | ICD-10-CM | POA: Diagnosis not present

## 2020-05-30 DIAGNOSIS — D509 Iron deficiency anemia, unspecified: Secondary | ICD-10-CM | POA: Diagnosis not present

## 2020-05-30 DIAGNOSIS — I272 Pulmonary hypertension, unspecified: Secondary | ICD-10-CM | POA: Diagnosis not present

## 2020-05-30 DIAGNOSIS — Z9181 History of falling: Secondary | ICD-10-CM | POA: Diagnosis not present

## 2020-05-30 DIAGNOSIS — I361 Nonrheumatic tricuspid (valve) insufficiency: Secondary | ICD-10-CM | POA: Diagnosis not present

## 2020-05-30 DIAGNOSIS — E785 Hyperlipidemia, unspecified: Secondary | ICD-10-CM | POA: Diagnosis not present

## 2020-05-31 DIAGNOSIS — Z992 Dependence on renal dialysis: Secondary | ICD-10-CM | POA: Diagnosis not present

## 2020-05-31 DIAGNOSIS — N186 End stage renal disease: Secondary | ICD-10-CM | POA: Diagnosis not present

## 2020-05-31 DIAGNOSIS — N2581 Secondary hyperparathyroidism of renal origin: Secondary | ICD-10-CM | POA: Diagnosis not present

## 2020-05-31 DIAGNOSIS — D509 Iron deficiency anemia, unspecified: Secondary | ICD-10-CM | POA: Diagnosis not present

## 2020-05-31 DIAGNOSIS — Z298 Encounter for other specified prophylactic measures: Secondary | ICD-10-CM | POA: Diagnosis not present

## 2020-05-31 DIAGNOSIS — D631 Anemia in chronic kidney disease: Secondary | ICD-10-CM | POA: Diagnosis not present

## 2020-06-01 DIAGNOSIS — Z992 Dependence on renal dialysis: Secondary | ICD-10-CM | POA: Diagnosis not present

## 2020-06-01 DIAGNOSIS — Z298 Encounter for other specified prophylactic measures: Secondary | ICD-10-CM | POA: Diagnosis not present

## 2020-06-01 DIAGNOSIS — N186 End stage renal disease: Secondary | ICD-10-CM | POA: Diagnosis not present

## 2020-06-01 DIAGNOSIS — D631 Anemia in chronic kidney disease: Secondary | ICD-10-CM | POA: Diagnosis not present

## 2020-06-01 DIAGNOSIS — D509 Iron deficiency anemia, unspecified: Secondary | ICD-10-CM | POA: Diagnosis not present

## 2020-06-01 DIAGNOSIS — N2581 Secondary hyperparathyroidism of renal origin: Secondary | ICD-10-CM | POA: Diagnosis not present

## 2020-06-02 DIAGNOSIS — D509 Iron deficiency anemia, unspecified: Secondary | ICD-10-CM | POA: Diagnosis not present

## 2020-06-02 DIAGNOSIS — N2581 Secondary hyperparathyroidism of renal origin: Secondary | ICD-10-CM | POA: Diagnosis not present

## 2020-06-02 DIAGNOSIS — D631 Anemia in chronic kidney disease: Secondary | ICD-10-CM | POA: Diagnosis not present

## 2020-06-02 DIAGNOSIS — Z298 Encounter for other specified prophylactic measures: Secondary | ICD-10-CM | POA: Diagnosis not present

## 2020-06-02 DIAGNOSIS — N186 End stage renal disease: Secondary | ICD-10-CM | POA: Diagnosis not present

## 2020-06-02 DIAGNOSIS — Z992 Dependence on renal dialysis: Secondary | ICD-10-CM | POA: Diagnosis not present

## 2020-06-03 DIAGNOSIS — G301 Alzheimer's disease with late onset: Secondary | ICD-10-CM | POA: Diagnosis not present

## 2020-06-03 DIAGNOSIS — N2581 Secondary hyperparathyroidism of renal origin: Secondary | ICD-10-CM | POA: Diagnosis not present

## 2020-06-03 DIAGNOSIS — E1122 Type 2 diabetes mellitus with diabetic chronic kidney disease: Secondary | ICD-10-CM | POA: Diagnosis not present

## 2020-06-03 DIAGNOSIS — I129 Hypertensive chronic kidney disease with stage 1 through stage 4 chronic kidney disease, or unspecified chronic kidney disease: Secondary | ICD-10-CM | POA: Diagnosis not present

## 2020-06-03 DIAGNOSIS — D509 Iron deficiency anemia, unspecified: Secondary | ICD-10-CM | POA: Diagnosis not present

## 2020-06-03 DIAGNOSIS — N186 End stage renal disease: Secondary | ICD-10-CM | POA: Diagnosis not present

## 2020-06-03 DIAGNOSIS — Z992 Dependence on renal dialysis: Secondary | ICD-10-CM | POA: Diagnosis not present

## 2020-06-03 DIAGNOSIS — I2510 Atherosclerotic heart disease of native coronary artery without angina pectoris: Secondary | ICD-10-CM | POA: Diagnosis not present

## 2020-06-03 DIAGNOSIS — N189 Chronic kidney disease, unspecified: Secondary | ICD-10-CM | POA: Diagnosis not present

## 2020-06-03 DIAGNOSIS — F0280 Dementia in other diseases classified elsewhere without behavioral disturbance: Secondary | ICD-10-CM | POA: Diagnosis not present

## 2020-06-03 DIAGNOSIS — Z298 Encounter for other specified prophylactic measures: Secondary | ICD-10-CM | POA: Diagnosis not present

## 2020-06-03 DIAGNOSIS — D631 Anemia in chronic kidney disease: Secondary | ICD-10-CM | POA: Diagnosis not present

## 2020-06-04 DIAGNOSIS — N2581 Secondary hyperparathyroidism of renal origin: Secondary | ICD-10-CM | POA: Diagnosis not present

## 2020-06-04 DIAGNOSIS — Z992 Dependence on renal dialysis: Secondary | ICD-10-CM | POA: Diagnosis not present

## 2020-06-04 DIAGNOSIS — D509 Iron deficiency anemia, unspecified: Secondary | ICD-10-CM | POA: Diagnosis not present

## 2020-06-04 DIAGNOSIS — N186 End stage renal disease: Secondary | ICD-10-CM | POA: Diagnosis not present

## 2020-06-04 DIAGNOSIS — D631 Anemia in chronic kidney disease: Secondary | ICD-10-CM | POA: Diagnosis not present

## 2020-06-04 DIAGNOSIS — Z298 Encounter for other specified prophylactic measures: Secondary | ICD-10-CM | POA: Diagnosis not present

## 2020-06-05 DIAGNOSIS — Z992 Dependence on renal dialysis: Secondary | ICD-10-CM | POA: Diagnosis not present

## 2020-06-05 DIAGNOSIS — Z298 Encounter for other specified prophylactic measures: Secondary | ICD-10-CM | POA: Diagnosis not present

## 2020-06-05 DIAGNOSIS — D509 Iron deficiency anemia, unspecified: Secondary | ICD-10-CM | POA: Diagnosis not present

## 2020-06-05 DIAGNOSIS — N2581 Secondary hyperparathyroidism of renal origin: Secondary | ICD-10-CM | POA: Diagnosis not present

## 2020-06-05 DIAGNOSIS — N186 End stage renal disease: Secondary | ICD-10-CM | POA: Diagnosis not present

## 2020-06-05 DIAGNOSIS — D631 Anemia in chronic kidney disease: Secondary | ICD-10-CM | POA: Diagnosis not present

## 2020-06-06 DIAGNOSIS — Z298 Encounter for other specified prophylactic measures: Secondary | ICD-10-CM | POA: Diagnosis not present

## 2020-06-06 DIAGNOSIS — Z992 Dependence on renal dialysis: Secondary | ICD-10-CM | POA: Diagnosis not present

## 2020-06-06 DIAGNOSIS — N186 End stage renal disease: Secondary | ICD-10-CM | POA: Diagnosis not present

## 2020-06-06 DIAGNOSIS — D631 Anemia in chronic kidney disease: Secondary | ICD-10-CM | POA: Diagnosis not present

## 2020-06-06 DIAGNOSIS — D509 Iron deficiency anemia, unspecified: Secondary | ICD-10-CM | POA: Diagnosis not present

## 2020-06-06 DIAGNOSIS — N2581 Secondary hyperparathyroidism of renal origin: Secondary | ICD-10-CM | POA: Diagnosis not present

## 2020-06-07 DIAGNOSIS — Z298 Encounter for other specified prophylactic measures: Secondary | ICD-10-CM | POA: Diagnosis not present

## 2020-06-07 DIAGNOSIS — Z992 Dependence on renal dialysis: Secondary | ICD-10-CM | POA: Diagnosis not present

## 2020-06-07 DIAGNOSIS — D509 Iron deficiency anemia, unspecified: Secondary | ICD-10-CM | POA: Diagnosis not present

## 2020-06-07 DIAGNOSIS — N186 End stage renal disease: Secondary | ICD-10-CM | POA: Diagnosis not present

## 2020-06-07 DIAGNOSIS — D631 Anemia in chronic kidney disease: Secondary | ICD-10-CM | POA: Diagnosis not present

## 2020-06-07 DIAGNOSIS — N2581 Secondary hyperparathyroidism of renal origin: Secondary | ICD-10-CM | POA: Diagnosis not present

## 2020-06-08 DIAGNOSIS — N186 End stage renal disease: Secondary | ICD-10-CM | POA: Diagnosis not present

## 2020-06-08 DIAGNOSIS — N2581 Secondary hyperparathyroidism of renal origin: Secondary | ICD-10-CM | POA: Diagnosis not present

## 2020-06-08 DIAGNOSIS — Z992 Dependence on renal dialysis: Secondary | ICD-10-CM | POA: Diagnosis not present

## 2020-06-08 DIAGNOSIS — D509 Iron deficiency anemia, unspecified: Secondary | ICD-10-CM | POA: Diagnosis not present

## 2020-06-08 DIAGNOSIS — Z298 Encounter for other specified prophylactic measures: Secondary | ICD-10-CM | POA: Diagnosis not present

## 2020-06-08 DIAGNOSIS — D631 Anemia in chronic kidney disease: Secondary | ICD-10-CM | POA: Diagnosis not present

## 2020-06-09 DIAGNOSIS — N186 End stage renal disease: Secondary | ICD-10-CM | POA: Diagnosis not present

## 2020-06-09 DIAGNOSIS — Z298 Encounter for other specified prophylactic measures: Secondary | ICD-10-CM | POA: Diagnosis not present

## 2020-06-09 DIAGNOSIS — Z992 Dependence on renal dialysis: Secondary | ICD-10-CM | POA: Diagnosis not present

## 2020-06-09 DIAGNOSIS — D631 Anemia in chronic kidney disease: Secondary | ICD-10-CM | POA: Diagnosis not present

## 2020-06-09 DIAGNOSIS — D509 Iron deficiency anemia, unspecified: Secondary | ICD-10-CM | POA: Diagnosis not present

## 2020-06-09 DIAGNOSIS — N2581 Secondary hyperparathyroidism of renal origin: Secondary | ICD-10-CM | POA: Diagnosis not present

## 2020-06-10 DIAGNOSIS — D509 Iron deficiency anemia, unspecified: Secondary | ICD-10-CM | POA: Diagnosis not present

## 2020-06-10 DIAGNOSIS — Z298 Encounter for other specified prophylactic measures: Secondary | ICD-10-CM | POA: Diagnosis not present

## 2020-06-10 DIAGNOSIS — D631 Anemia in chronic kidney disease: Secondary | ICD-10-CM | POA: Diagnosis not present

## 2020-06-10 DIAGNOSIS — N2581 Secondary hyperparathyroidism of renal origin: Secondary | ICD-10-CM | POA: Diagnosis not present

## 2020-06-10 DIAGNOSIS — N186 End stage renal disease: Secondary | ICD-10-CM | POA: Diagnosis not present

## 2020-06-10 DIAGNOSIS — Z992 Dependence on renal dialysis: Secondary | ICD-10-CM | POA: Diagnosis not present

## 2020-06-11 DIAGNOSIS — D509 Iron deficiency anemia, unspecified: Secondary | ICD-10-CM | POA: Diagnosis not present

## 2020-06-11 DIAGNOSIS — Z992 Dependence on renal dialysis: Secondary | ICD-10-CM | POA: Diagnosis not present

## 2020-06-11 DIAGNOSIS — D631 Anemia in chronic kidney disease: Secondary | ICD-10-CM | POA: Diagnosis not present

## 2020-06-11 DIAGNOSIS — Z298 Encounter for other specified prophylactic measures: Secondary | ICD-10-CM | POA: Diagnosis not present

## 2020-06-11 DIAGNOSIS — N2581 Secondary hyperparathyroidism of renal origin: Secondary | ICD-10-CM | POA: Diagnosis not present

## 2020-06-11 DIAGNOSIS — N186 End stage renal disease: Secondary | ICD-10-CM | POA: Diagnosis not present

## 2020-06-12 DIAGNOSIS — Z298 Encounter for other specified prophylactic measures: Secondary | ICD-10-CM | POA: Diagnosis not present

## 2020-06-12 DIAGNOSIS — N2581 Secondary hyperparathyroidism of renal origin: Secondary | ICD-10-CM | POA: Diagnosis not present

## 2020-06-12 DIAGNOSIS — D509 Iron deficiency anemia, unspecified: Secondary | ICD-10-CM | POA: Diagnosis not present

## 2020-06-12 DIAGNOSIS — D631 Anemia in chronic kidney disease: Secondary | ICD-10-CM | POA: Diagnosis not present

## 2020-06-12 DIAGNOSIS — Z992 Dependence on renal dialysis: Secondary | ICD-10-CM | POA: Diagnosis not present

## 2020-06-12 DIAGNOSIS — N186 End stage renal disease: Secondary | ICD-10-CM | POA: Diagnosis not present

## 2020-06-13 DIAGNOSIS — D631 Anemia in chronic kidney disease: Secondary | ICD-10-CM | POA: Diagnosis not present

## 2020-06-13 DIAGNOSIS — Z992 Dependence on renal dialysis: Secondary | ICD-10-CM | POA: Diagnosis not present

## 2020-06-13 DIAGNOSIS — Z298 Encounter for other specified prophylactic measures: Secondary | ICD-10-CM | POA: Diagnosis not present

## 2020-06-13 DIAGNOSIS — N186 End stage renal disease: Secondary | ICD-10-CM | POA: Diagnosis not present

## 2020-06-13 DIAGNOSIS — D509 Iron deficiency anemia, unspecified: Secondary | ICD-10-CM | POA: Diagnosis not present

## 2020-06-14 DIAGNOSIS — I10 Essential (primary) hypertension: Secondary | ICD-10-CM | POA: Diagnosis not present

## 2020-06-14 DIAGNOSIS — D509 Iron deficiency anemia, unspecified: Secondary | ICD-10-CM | POA: Diagnosis not present

## 2020-06-14 DIAGNOSIS — Z992 Dependence on renal dialysis: Secondary | ICD-10-CM | POA: Diagnosis not present

## 2020-06-14 DIAGNOSIS — R0602 Shortness of breath: Secondary | ICD-10-CM | POA: Diagnosis not present

## 2020-06-14 DIAGNOSIS — Z298 Encounter for other specified prophylactic measures: Secondary | ICD-10-CM | POA: Diagnosis not present

## 2020-06-14 DIAGNOSIS — D631 Anemia in chronic kidney disease: Secondary | ICD-10-CM | POA: Diagnosis not present

## 2020-06-14 DIAGNOSIS — I25810 Atherosclerosis of coronary artery bypass graft(s) without angina pectoris: Secondary | ICD-10-CM | POA: Diagnosis not present

## 2020-06-14 DIAGNOSIS — I340 Nonrheumatic mitral (valve) insufficiency: Secondary | ICD-10-CM | POA: Diagnosis not present

## 2020-06-14 DIAGNOSIS — G44009 Cluster headache syndrome, unspecified, not intractable: Secondary | ICD-10-CM | POA: Diagnosis not present

## 2020-06-14 DIAGNOSIS — I2510 Atherosclerotic heart disease of native coronary artery without angina pectoris: Secondary | ICD-10-CM | POA: Diagnosis not present

## 2020-06-14 DIAGNOSIS — N186 End stage renal disease: Secondary | ICD-10-CM | POA: Diagnosis not present

## 2020-06-14 DIAGNOSIS — K219 Gastro-esophageal reflux disease without esophagitis: Secondary | ICD-10-CM | POA: Diagnosis not present

## 2020-06-15 ENCOUNTER — Ambulatory Visit: Payer: Medicare Other | Admitting: Family Medicine

## 2020-06-15 DIAGNOSIS — Z992 Dependence on renal dialysis: Secondary | ICD-10-CM | POA: Diagnosis not present

## 2020-06-15 DIAGNOSIS — N186 End stage renal disease: Secondary | ICD-10-CM | POA: Diagnosis not present

## 2020-06-15 DIAGNOSIS — Z298 Encounter for other specified prophylactic measures: Secondary | ICD-10-CM | POA: Diagnosis not present

## 2020-06-15 DIAGNOSIS — D509 Iron deficiency anemia, unspecified: Secondary | ICD-10-CM | POA: Diagnosis not present

## 2020-06-15 DIAGNOSIS — D631 Anemia in chronic kidney disease: Secondary | ICD-10-CM | POA: Diagnosis not present

## 2020-06-16 DIAGNOSIS — D631 Anemia in chronic kidney disease: Secondary | ICD-10-CM | POA: Diagnosis not present

## 2020-06-16 DIAGNOSIS — D509 Iron deficiency anemia, unspecified: Secondary | ICD-10-CM | POA: Diagnosis not present

## 2020-06-16 DIAGNOSIS — Z992 Dependence on renal dialysis: Secondary | ICD-10-CM | POA: Diagnosis not present

## 2020-06-16 DIAGNOSIS — N186 End stage renal disease: Secondary | ICD-10-CM | POA: Diagnosis not present

## 2020-06-16 DIAGNOSIS — Z298 Encounter for other specified prophylactic measures: Secondary | ICD-10-CM | POA: Diagnosis not present

## 2020-06-17 DIAGNOSIS — N186 End stage renal disease: Secondary | ICD-10-CM | POA: Diagnosis not present

## 2020-06-17 DIAGNOSIS — Z298 Encounter for other specified prophylactic measures: Secondary | ICD-10-CM | POA: Diagnosis not present

## 2020-06-17 DIAGNOSIS — D631 Anemia in chronic kidney disease: Secondary | ICD-10-CM | POA: Diagnosis not present

## 2020-06-17 DIAGNOSIS — Z992 Dependence on renal dialysis: Secondary | ICD-10-CM | POA: Diagnosis not present

## 2020-06-17 DIAGNOSIS — D509 Iron deficiency anemia, unspecified: Secondary | ICD-10-CM | POA: Diagnosis not present

## 2020-06-18 DIAGNOSIS — Z298 Encounter for other specified prophylactic measures: Secondary | ICD-10-CM | POA: Diagnosis not present

## 2020-06-18 DIAGNOSIS — N186 End stage renal disease: Secondary | ICD-10-CM | POA: Diagnosis not present

## 2020-06-18 DIAGNOSIS — Z992 Dependence on renal dialysis: Secondary | ICD-10-CM | POA: Diagnosis not present

## 2020-06-18 DIAGNOSIS — D509 Iron deficiency anemia, unspecified: Secondary | ICD-10-CM | POA: Diagnosis not present

## 2020-06-18 DIAGNOSIS — D631 Anemia in chronic kidney disease: Secondary | ICD-10-CM | POA: Diagnosis not present

## 2020-06-19 DIAGNOSIS — D509 Iron deficiency anemia, unspecified: Secondary | ICD-10-CM | POA: Diagnosis not present

## 2020-06-19 DIAGNOSIS — N186 End stage renal disease: Secondary | ICD-10-CM | POA: Diagnosis not present

## 2020-06-19 DIAGNOSIS — Z298 Encounter for other specified prophylactic measures: Secondary | ICD-10-CM | POA: Diagnosis not present

## 2020-06-19 DIAGNOSIS — D631 Anemia in chronic kidney disease: Secondary | ICD-10-CM | POA: Diagnosis not present

## 2020-06-19 DIAGNOSIS — Z992 Dependence on renal dialysis: Secondary | ICD-10-CM | POA: Diagnosis not present

## 2020-06-20 DIAGNOSIS — Z992 Dependence on renal dialysis: Secondary | ICD-10-CM | POA: Diagnosis not present

## 2020-06-20 DIAGNOSIS — Z298 Encounter for other specified prophylactic measures: Secondary | ICD-10-CM | POA: Diagnosis not present

## 2020-06-20 DIAGNOSIS — D509 Iron deficiency anemia, unspecified: Secondary | ICD-10-CM | POA: Diagnosis not present

## 2020-06-20 DIAGNOSIS — D631 Anemia in chronic kidney disease: Secondary | ICD-10-CM | POA: Diagnosis not present

## 2020-06-20 DIAGNOSIS — N186 End stage renal disease: Secondary | ICD-10-CM | POA: Diagnosis not present

## 2020-06-21 ENCOUNTER — Telehealth: Payer: Self-pay | Admitting: Pharmacist

## 2020-06-21 DIAGNOSIS — F0280 Dementia in other diseases classified elsewhere without behavioral disturbance: Secondary | ICD-10-CM | POA: Diagnosis not present

## 2020-06-21 DIAGNOSIS — N189 Chronic kidney disease, unspecified: Secondary | ICD-10-CM | POA: Diagnosis not present

## 2020-06-21 DIAGNOSIS — G301 Alzheimer's disease with late onset: Secondary | ICD-10-CM | POA: Diagnosis not present

## 2020-06-21 DIAGNOSIS — D509 Iron deficiency anemia, unspecified: Secondary | ICD-10-CM | POA: Diagnosis not present

## 2020-06-21 DIAGNOSIS — D631 Anemia in chronic kidney disease: Secondary | ICD-10-CM | POA: Diagnosis not present

## 2020-06-21 DIAGNOSIS — N186 End stage renal disease: Secondary | ICD-10-CM | POA: Diagnosis not present

## 2020-06-21 DIAGNOSIS — E1122 Type 2 diabetes mellitus with diabetic chronic kidney disease: Secondary | ICD-10-CM | POA: Diagnosis not present

## 2020-06-21 DIAGNOSIS — I2510 Atherosclerotic heart disease of native coronary artery without angina pectoris: Secondary | ICD-10-CM | POA: Diagnosis not present

## 2020-06-21 DIAGNOSIS — Z298 Encounter for other specified prophylactic measures: Secondary | ICD-10-CM | POA: Diagnosis not present

## 2020-06-21 DIAGNOSIS — I129 Hypertensive chronic kidney disease with stage 1 through stage 4 chronic kidney disease, or unspecified chronic kidney disease: Secondary | ICD-10-CM | POA: Diagnosis not present

## 2020-06-21 DIAGNOSIS — Z992 Dependence on renal dialysis: Secondary | ICD-10-CM | POA: Diagnosis not present

## 2020-06-21 NOTE — Chronic Care Management (AMB) (Signed)
Chronic Care Management Pharmacy Assistant   Name: Ray Lamb  MRN: [id number] DOB: 1930-09-30   Reason for Encounter: Disease State Diabetes Mellitus     Recent office visits:  05/24/2020 [facility] Oncologist)  Recent consult visits:  None noted  Hospital visits:  None in previous 6 months  Medications: Outpatient Encounter Medications as of 06/21/2020  Medication Sig Note  . ASPIRIN 81 PO Take by mouth every other day.    Marland Kitchen atorvastatin (LIPITOR) 80 MG tablet Take 1 tablet (80 mg total) by mouth at bedtime.   . carvedilol (COREG) 12.5 MG tablet Take by mouth 2 (two) times daily.    . clopidogrel (PLAVIX) 75 MG tablet Take 1 tablet (75 mg total) by mouth daily.   Marland Kitchen ezetimibe (ZETIA) 10 MG tablet Take 1 tablet (10 mg total) by mouth daily.   . isosorbide mononitrate (IMDUR) 30 MG 24 hr tablet Take 1.5 tablets (45 mg total) by mouth daily.   . Lancets (ONETOUCH ULTRASOFT) lancets USE TO CHECK BLOOD SUGAR TWICE A DAY   . loratadine (CLARITIN) 10 MG tablet Take 10 mg by mouth daily.   . Melatonin 10 MG TABS Take 10 mg by mouth at bedtime.   . mirtazapine (REMERON) 7.5 MG tablet Take 7.5 mg by mouth as needed.  11/23/2019: Takes rarely for appetite stimualtion  . Multiple Vitamins-Minerals (CENTRUM SILVER ULTRA MENS PO) Take by mouth daily.   Marland Kitchen nystatin (MYCOSTATIN/NYSTOP) powder Apply 1 application topically 3 (three) times daily.   . ondansetron (ZOFRAN) 4 MG tablet Take 4 mg by mouth every 8 (eight) hours as needed.   . sitaGLIPtin (JANUVIA) 25 MG tablet Take 1 tablet (25 mg total) by mouth daily.   . timolol (TIMOPTIC) 0.5 % ophthalmic solution Place 1 drop into both eyes daily.    Marland Kitchen torsemide (DEMADEX) 20 MG tablet Take 40 mg by mouth daily.   . TURMERIC PO Take 1,300 mg by mouth daily.     Facility-Administered Encounter Medications as of 06/21/2020  Medication  . sodium chloride flush (NS) 0.9 % injection 3 mL   Recent Relevant Labs: Lab Results  Component  Value Date/Time   HGBA1C 7.8 02/23/2020 12:00 AM   HGBA1C 6.1 11/20/2019 12:00 AM   MICROALBUR 150 (H) 07/03/2019 11:42 AM   MICROALBUR 150 (H) 01/02/2019 11:26 AM    Kidney Function Lab Results  Component Value Date/Time   CREATININE 6.34 (H) 12/12/2019 03:11 PM   CREATININE 4.36 (H) 07/03/2019 11:45 AM   GFRNONAA 7 (L) 12/12/2019 03:11 PM   GFRAA 8 (L) 12/12/2019 03:11 PM    . Current antihyperglycemic regimen:  o Januvia 25 mg daily  . What recent interventions/DTPs have been made to improve glycemic control:  o None noted  . Have there been any recent hospitalizations or ED visits since last visit with CPP? No   . Patient denies hypoglycemic symptoms, including Pale, Sweaty, Shaky, Hungry, Nervous/irritable and Vision changes   . Patient denies hyperglycemic symptoms, including blurry vision, excessive thirst, fatigue, polyuria and weakness   . How often are you checking your blood sugar? Infrequently  . What are your blood sugars ranging?  o Fasting: 06/21/20 144  06/11/20 151  05/28/20 151 o Before meals:  o After meals:  o Bedtime:   . During the week, how often does your blood glucose drop below 70? Never   . Are you checking your feet daily/regularly?  o Patient states he checks his feet everyday.  He reports swelling in leg and feet but it is being managed by Dr.Singh  Patient states he had his A1C checked at [facility] on 05/27/20 it was 6.3 per patient.  Adherence Review: Is the patient currently on a STATIN medication? Yes Is the patient currently on ACE/ARB medication? No Does the patient have >5 day gap between last estimated fill dates? Yes      Star Rating Drugs: Atorvastatin 80 mg Last filled: 05/24/2020 90DS Januvia 25 mg Last filled: 03/28/2020 30 DS  [facility] Clinical Pharmacist Assistant [phone]

## 2020-06-22 DIAGNOSIS — N186 End stage renal disease: Secondary | ICD-10-CM | POA: Diagnosis not present

## 2020-06-22 DIAGNOSIS — D509 Iron deficiency anemia, unspecified: Secondary | ICD-10-CM | POA: Diagnosis not present

## 2020-06-22 DIAGNOSIS — D631 Anemia in chronic kidney disease: Secondary | ICD-10-CM | POA: Diagnosis not present

## 2020-06-22 DIAGNOSIS — Z992 Dependence on renal dialysis: Secondary | ICD-10-CM | POA: Diagnosis not present

## 2020-06-22 DIAGNOSIS — Z298 Encounter for other specified prophylactic measures: Secondary | ICD-10-CM | POA: Diagnosis not present

## 2020-06-23 DIAGNOSIS — D631 Anemia in chronic kidney disease: Secondary | ICD-10-CM | POA: Diagnosis not present

## 2020-06-23 DIAGNOSIS — Z992 Dependence on renal dialysis: Secondary | ICD-10-CM | POA: Diagnosis not present

## 2020-06-23 DIAGNOSIS — Z298 Encounter for other specified prophylactic measures: Secondary | ICD-10-CM | POA: Diagnosis not present

## 2020-06-23 DIAGNOSIS — N186 End stage renal disease: Secondary | ICD-10-CM | POA: Diagnosis not present

## 2020-06-23 DIAGNOSIS — D509 Iron deficiency anemia, unspecified: Secondary | ICD-10-CM | POA: Diagnosis not present

## 2020-06-24 DIAGNOSIS — D631 Anemia in chronic kidney disease: Secondary | ICD-10-CM | POA: Diagnosis not present

## 2020-06-24 DIAGNOSIS — D509 Iron deficiency anemia, unspecified: Secondary | ICD-10-CM | POA: Diagnosis not present

## 2020-06-24 DIAGNOSIS — Z298 Encounter for other specified prophylactic measures: Secondary | ICD-10-CM | POA: Diagnosis not present

## 2020-06-24 DIAGNOSIS — Z992 Dependence on renal dialysis: Secondary | ICD-10-CM | POA: Diagnosis not present

## 2020-06-24 DIAGNOSIS — N186 End stage renal disease: Secondary | ICD-10-CM | POA: Diagnosis not present

## 2020-06-25 ENCOUNTER — Encounter: Payer: Self-pay | Admitting: Family Medicine

## 2020-06-25 ENCOUNTER — Other Ambulatory Visit: Payer: Self-pay

## 2020-06-25 ENCOUNTER — Ambulatory Visit (INDEPENDENT_AMBULATORY_CARE_PROVIDER_SITE_OTHER): Payer: Medicare Other | Admitting: Family Medicine

## 2020-06-25 VITALS — BP 128/75 | HR 64 | Temp 97.9°F | Wt 133.4 lb

## 2020-06-25 DIAGNOSIS — I25709 Atherosclerosis of coronary artery bypass graft(s), unspecified, with unspecified angina pectoris: Secondary | ICD-10-CM | POA: Diagnosis not present

## 2020-06-25 DIAGNOSIS — Z298 Encounter for other specified prophylactic measures: Secondary | ICD-10-CM | POA: Diagnosis not present

## 2020-06-25 DIAGNOSIS — D692 Other nonthrombocytopenic purpura: Secondary | ICD-10-CM | POA: Diagnosis not present

## 2020-06-25 DIAGNOSIS — L299 Pruritus, unspecified: Secondary | ICD-10-CM | POA: Diagnosis not present

## 2020-06-25 DIAGNOSIS — N186 End stage renal disease: Secondary | ICD-10-CM | POA: Diagnosis not present

## 2020-06-25 DIAGNOSIS — N184 Chronic kidney disease, stage 4 (severe): Secondary | ICD-10-CM

## 2020-06-25 DIAGNOSIS — E7800 Pure hypercholesterolemia, unspecified: Secondary | ICD-10-CM | POA: Diagnosis not present

## 2020-06-25 DIAGNOSIS — I129 Hypertensive chronic kidney disease with stage 1 through stage 4 chronic kidney disease, or unspecified chronic kidney disease: Secondary | ICD-10-CM | POA: Diagnosis not present

## 2020-06-25 DIAGNOSIS — D631 Anemia in chronic kidney disease: Secondary | ICD-10-CM | POA: Diagnosis not present

## 2020-06-25 DIAGNOSIS — Z992 Dependence on renal dialysis: Secondary | ICD-10-CM | POA: Diagnosis not present

## 2020-06-25 DIAGNOSIS — E782 Mixed hyperlipidemia: Secondary | ICD-10-CM

## 2020-06-25 DIAGNOSIS — I10 Essential (primary) hypertension: Secondary | ICD-10-CM

## 2020-06-25 DIAGNOSIS — E1122 Type 2 diabetes mellitus with diabetic chronic kidney disease: Secondary | ICD-10-CM

## 2020-06-25 DIAGNOSIS — N185 Chronic kidney disease, stage 5: Secondary | ICD-10-CM | POA: Diagnosis not present

## 2020-06-25 DIAGNOSIS — D509 Iron deficiency anemia, unspecified: Secondary | ICD-10-CM | POA: Diagnosis not present

## 2020-06-25 LAB — BAYER DCA HB A1C WAIVED: HB A1C (BAYER DCA - WAIVED): 6.8 % (ref <7.0)

## 2020-06-25 MED ORDER — CLOPIDOGREL BISULFATE 75 MG PO TABS: 75.0000 mg | ORAL_TABLET | Freq: Every day | ORAL | 3 refills | Status: AC

## 2020-06-25 MED ORDER — EZETIMIBE 10 MG PO TABS: 10.0000 mg | ORAL_TABLET | Freq: Every day | ORAL | 1 refills | Status: DC

## 2020-06-25 MED ORDER — ISOSORBIDE MONONITRATE ER 30 MG PO TB24: 45.0000 mg | ORAL_TABLET | Freq: Every day | ORAL | 1 refills | Status: DC

## 2020-06-25 MED ORDER — SITAGLIPTIN PHOSPHATE 25 MG PO TABS: 25.0000 mg | ORAL_TABLET | Freq: Every day | ORAL | 1 refills | Status: DC

## 2020-06-25 MED ORDER — GABAPENTIN 100 MG PO CAPS: 100.0000 mg | ORAL_CAPSULE | Freq: Every day | ORAL | 1 refills | Status: DC

## 2020-06-25 MED ORDER — ATORVASTATIN CALCIUM 80 MG PO TABS: 80.0000 mg | ORAL_TABLET | Freq: Every day | ORAL | 1 refills | Status: DC

## 2020-06-25 MED ORDER — LEVOCETIRIZINE DIHYDROCHLORIDE 5 MG PO TABS: 5.0000 mg | ORAL_TABLET | Freq: Every evening | ORAL | 3 refills | Status: AC

## 2020-06-25 NOTE — Assessment & Plan Note (Signed)
Will keep BP, sugars and cholesterol under good control. Continue to monitor.

## 2020-06-25 NOTE — Assessment & Plan Note (Signed)
Under good control on current regimen. Continue current regimen. Continue to monitor. Call with any concerns. Refills given. Labs drawn today.   

## 2020-06-25 NOTE — Progress Notes (Signed)
BP 128/75   Pulse 64   Temp 97.9 F (36.6 C)   Wt 133 lb 6.4 oz (60.5 kg)   SpO2 100%   BMI 22.77 kg/m    Subjective:    Patient ID: Ray Lamb, male    DOB: 1930-08-20, 87 y.o.   MRN: [id number]  HPI: Ray Lamb is a 87 y.o. male  Chief Complaint  Patient presents with  . Diabetes  . Hypertension  . Hyperlipidemia   Has been having some congestion and a cough at night.  Has been having some itching on his skin. Has seen dermatology and they think that it's more nerve endings. They have not done anything oral. He has tried topical meds but they have not helped.   DIABETES Hypoglycemic episodes:no Polydipsia/polyuria: no Visual disturbance: no Chest pain: no Paresthesias: no Glucose Monitoring: yes  Accucheck frequency: Daily Taking Insulin?: no Blood Pressure Monitoring: not checking Retinal Examination: Up to Date Foot Exam: Up to Date Diabetic Education: Completed Pneumovax: Up to Date Influenza: Up to Date Aspirin: yes  HYPERTENSION / HYPERLIPIDEMIA Satisfied with current treatment? yes Duration of hypertension: chronic BP monitoring frequency: not checking BP medication side effects: no Duration of hyperlipidemia: chronic Cholesterol medication side effects: no Cholesterol supplements: none Past cholesterol medications: atorvastatin Medication compliance: excellent compliance Aspirin: yes Recent stressors: no Recurrent headaches: no Visual changes: no Palpitations: no Dyspnea: no Chest pain: no Lower extremity edema: no Dizzy/lightheaded: no  Relevant past medical, surgical, family and social history reviewed and updated as indicated. Interim medical history since our last visit reviewed. Allergies and medications reviewed and updated.  Review of Systems  Constitutional: Negative.   HENT: Negative.   Respiratory: Negative.   Cardiovascular: Negative.   Gastrointestinal: Negative.   Musculoskeletal: Negative.   Psychiatric/Behavioral:  Negative.     Per HPI unless specifically indicated above     Objective:    BP 128/75   Pulse 64   Temp 97.9 F (36.6 C)   Wt 133 lb 6.4 oz (60.5 kg)   SpO2 100%   BMI 22.77 kg/m   Wt Readings from Last 3 Encounters:  06/25/20 133 lb 6.4 oz (60.5 kg)  03/15/20 140 lb (63.5 kg)  12/12/19 125 lb 9.6 oz (57 kg)    Physical Exam Vitals and nursing note reviewed.  Constitutional:      General: He is not in acute distress.    Appearance: Normal appearance. He is not ill-appearing, toxic-appearing or diaphoretic.  HENT:     Head: Normocephalic and atraumatic.     Right Ear: External ear normal.     Left Ear: External ear normal.     Nose: Nose normal.     Mouth/Throat:     Mouth: Mucous membranes are moist.     Pharynx: Oropharynx is clear.  Eyes:     General: No scleral icterus.       Right eye: No discharge.        Left eye: No discharge.     Extraocular Movements: Extraocular movements intact.     Conjunctiva/sclera: Conjunctivae normal.     Pupils: Pupils are equal, round, and reactive to light.  Cardiovascular:     Rate and Rhythm: Normal rate and regular rhythm.     Pulses: Normal pulses.     Heart sounds: Normal heart sounds. No murmur heard. No friction rub. No gallop.   Pulmonary:     Effort: Pulmonary effort is normal. No respiratory distress.  Breath sounds: Normal breath sounds. No stridor. No wheezing, rhonchi or rales.  Chest:     Chest wall: No tenderness.  Musculoskeletal:        General: Normal range of motion.     Cervical back: Normal range of motion and neck supple.  Skin:    General: Skin is warm and dry.     Capillary Refill: Capillary refill takes less than 2 seconds.     Coloration: Skin is not jaundiced or pale.     Findings: No bruising, erythema, lesion or rash.  Neurological:     General: No focal deficit present.     Mental Status: He is alert and oriented to person, place, and time. Mental status is at baseline.  Psychiatric:         Mood and Affect: Mood normal.        Behavior: Behavior normal.        Thought Content: Thought content normal.        Judgment: Judgment normal.     Results for orders placed or performed in visit on 03/18/20  HM DIABETES EYE EXAM  Result Value Ref Range   HM Diabetic Eye Exam No Retinopathy No Retinopathy      Assessment & Plan:   Problem List Items Addressed This Visit      Cardiovascular and Mediastinum   Senile purpura ([place])    Stable. Continue to monitor. Call with any concerns.       Relevant Medications   isosorbide mononitrate (IMDUR) 30 MG 24 hr tablet   ezetimibe (ZETIA) 10 MG tablet   atorvastatin (LIPITOR) 80 MG tablet   Other Relevant Orders   CBC with Differential/Platelet   Comprehensive metabolic panel   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 Continue current regimen. Continue to monitor. Call with any concerns. Refills given. Labs drawn today.        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 Continue to monitor.       Relevant Medications   isosorbide mononitrate (IMDUR) 30 MG 24 hr tablet   ezetimibe (ZETIA) 10 MG tablet   atorvastatin (LIPITOR) 80 MG tablet     Endocrine   Diabetes (HCC)   Relevant Medications   sitaGLIPtin (JANUVIA) 25 MG tablet   atorvastatin (LIPITOR) 80 MG tablet   Other Relevant Orders   Bayer DCA Hb A1c Waived   Comprehensive metabolic panel     Genitourinary   Chronic kidney disease    Rechecking labs today. Await results. Treat as needed.       Relevant Orders   Comprehensive metabolic panel   ESRD (end stage renal disease) ([place])     Rechecking labs today. Await results. Treat as needed.         Other   Mixed hyperlipidemia    Under good control on current regimen. Continue current regimen. Continue to monitor. Call with any concerns. Refills given. Labs drawn today.       Relevant Medications   isosorbide mononitrate (IMDUR) 30 MG 24 hr tablet   ezetimibe (ZETIA) 10 MG tablet   atorvastatin (LIPITOR) 80 MG tablet  Other Relevant Orders   Comprehensive metabolic panel   Lipid Panel w/o Chol/HDL Ratio    Other Visit Diagnoses    Essential hypertension    -  Primary   Relevant Medications   isosorbide mononitrate (IMDUR) 30 MG 24 hr tablet   ezetimibe (ZETIA) 10 MG tablet   atorvastatin (LIPITOR) 80 MG tablet   Itching       Will start him on gabapentin to help with the itching. Recheck 1 month. Call with any concerns.    Pure hypercholesterolemia       Relevant Medications   isosorbide mononitrate (IMDUR) 30 MG 24 hr tablet   ezetimibe (ZETIA) 10 MG tablet   atorvastatin (LIPITOR) 80 MG tablet       Follow up plan: Return in about 4 weeks (around 07/23/2020) for follow up itching.

## 2020-06-25 NOTE — Assessment & Plan Note (Signed)
Stable. Continue to monitor. Call with any concerns.  ?

## 2020-06-25 NOTE — Assessment & Plan Note (Signed)
Rechecking labs today. Await results. Treat as needed.  °

## 2020-06-26 DIAGNOSIS — D509 Iron deficiency anemia, unspecified: Secondary | ICD-10-CM | POA: Diagnosis not present

## 2020-06-26 DIAGNOSIS — Z992 Dependence on renal dialysis: Secondary | ICD-10-CM | POA: Diagnosis not present

## 2020-06-26 DIAGNOSIS — D631 Anemia in chronic kidney disease: Secondary | ICD-10-CM | POA: Diagnosis not present

## 2020-06-26 DIAGNOSIS — Z298 Encounter for other specified prophylactic measures: Secondary | ICD-10-CM | POA: Diagnosis not present

## 2020-06-26 DIAGNOSIS — N186 End stage renal disease: Secondary | ICD-10-CM | POA: Diagnosis not present

## 2020-06-26 LAB — CBC WITH DIFFERENTIAL/PLATELET
Basophils Absolute: 0.1 10*3/uL (ref 0.0–0.2)
Basos: 1 %
EOS (ABSOLUTE): 0.4 10*3/uL (ref 0.0–0.4)
Eos: 4 %
Hematocrit: 38.5 % (ref 37.5–51.0)
Hemoglobin: 12.5 g/dL — ABNORMAL LOW (ref 13.0–17.7)
Immature Grans (Abs): 0.1 10*3/uL (ref 0.0–0.1)
Immature Granulocytes: 1 %
Lymphocytes Absolute: 3 10*3/uL (ref 0.7–3.1)
Lymphs: 32 %
MCH: 31.6 pg (ref 26.6–33.0)
MCHC: 32.5 g/dL (ref 31.5–35.7)
MCV: 98 fL — ABNORMAL HIGH (ref 79–97)
Monocytes Absolute: 1.1 10*3/uL — ABNORMAL HIGH (ref 0.1–0.9)
Monocytes: 11 %
Neutrophils Absolute: 4.9 10*3/uL (ref 1.4–7.0)
Neutrophils: 51 %
Platelets: 231 10*3/uL (ref 150–450)
RBC: 3.95 x10E6/uL — ABNORMAL LOW (ref 4.14–5.80)
RDW: 11.7 % (ref 11.6–15.4)
WBC: 9.4 10*3/uL (ref 3.4–10.8)

## 2020-06-26 LAB — LIPID PANEL W/O CHOL/HDL RATIO
Cholesterol, Total: 120 mg/dL (ref 100–199)
HDL: 31 mg/dL — ABNORMAL LOW (ref >39)
LDL Chol Calc (NIH): 68 mg/dL (ref 0–99)
Triglycerides: 116 mg/dL (ref 0–149)
VLDL Cholesterol Cal: 21 mg/dL (ref 5–40)

## 2020-06-26 LAB — COMPREHENSIVE METABOLIC PANEL
ALT: 13 IU/L (ref 0–44)
AST: 17 IU/L (ref 0–40)
Albumin/Globulin Ratio: 0.8 — ABNORMAL LOW (ref 1.2–2.2)
Albumin: 3.5 g/dL — ABNORMAL LOW (ref 3.6–4.6)
Alkaline Phosphatase: 101 IU/L (ref 44–121)
BUN/Creatinine Ratio: 6 — ABNORMAL LOW (ref 10–24)
BUN: 55 mg/dL — ABNORMAL HIGH (ref 8–27)
Bilirubin Total: 0.4 mg/dL (ref 0.0–1.2)
CO2: 25 mmol/L (ref 20–29)
Calcium: 9 mg/dL (ref 8.6–10.2)
Chloride: 92 mmol/L — ABNORMAL LOW (ref 96–106)
Creatinine, Ser: 8.68 mg/dL — ABNORMAL HIGH (ref 0.76–1.27)
Globulin, Total: 4.6 g/dL — ABNORMAL HIGH (ref 1.5–4.5)
Glucose: 110 mg/dL — ABNORMAL HIGH (ref 65–99)
Potassium: 4.6 mmol/L (ref 3.5–5.2)
Sodium: 136 mmol/L (ref 134–144)
Total Protein: 8.1 g/dL (ref 6.0–8.5)
eGFR: 5 mL/min/{1.73_m2} — ABNORMAL LOW (ref >59)

## 2020-06-27 DIAGNOSIS — D631 Anemia in chronic kidney disease: Secondary | ICD-10-CM | POA: Diagnosis not present

## 2020-06-27 DIAGNOSIS — D509 Iron deficiency anemia, unspecified: Secondary | ICD-10-CM | POA: Diagnosis not present

## 2020-06-27 DIAGNOSIS — Z298 Encounter for other specified prophylactic measures: Secondary | ICD-10-CM | POA: Diagnosis not present

## 2020-06-27 DIAGNOSIS — Z992 Dependence on renal dialysis: Secondary | ICD-10-CM | POA: Diagnosis not present

## 2020-06-27 DIAGNOSIS — N186 End stage renal disease: Secondary | ICD-10-CM | POA: Diagnosis not present

## 2020-06-28 DIAGNOSIS — N186 End stage renal disease: Secondary | ICD-10-CM | POA: Diagnosis not present

## 2020-06-28 DIAGNOSIS — Z298 Encounter for other specified prophylactic measures: Secondary | ICD-10-CM | POA: Diagnosis not present

## 2020-06-28 DIAGNOSIS — D631 Anemia in chronic kidney disease: Secondary | ICD-10-CM | POA: Diagnosis not present

## 2020-06-28 DIAGNOSIS — D509 Iron deficiency anemia, unspecified: Secondary | ICD-10-CM | POA: Diagnosis not present

## 2020-06-28 DIAGNOSIS — Z992 Dependence on renal dialysis: Secondary | ICD-10-CM | POA: Diagnosis not present

## 2020-06-29 DIAGNOSIS — Z298 Encounter for other specified prophylactic measures: Secondary | ICD-10-CM | POA: Diagnosis not present

## 2020-06-29 DIAGNOSIS — Z992 Dependence on renal dialysis: Secondary | ICD-10-CM | POA: Diagnosis not present

## 2020-06-29 DIAGNOSIS — D509 Iron deficiency anemia, unspecified: Secondary | ICD-10-CM | POA: Diagnosis not present

## 2020-06-29 DIAGNOSIS — D631 Anemia in chronic kidney disease: Secondary | ICD-10-CM | POA: Diagnosis not present

## 2020-06-29 DIAGNOSIS — N186 End stage renal disease: Secondary | ICD-10-CM | POA: Diagnosis not present

## 2020-06-30 DIAGNOSIS — Z298 Encounter for other specified prophylactic measures: Secondary | ICD-10-CM | POA: Diagnosis not present

## 2020-06-30 DIAGNOSIS — D509 Iron deficiency anemia, unspecified: Secondary | ICD-10-CM | POA: Diagnosis not present

## 2020-06-30 DIAGNOSIS — D631 Anemia in chronic kidney disease: Secondary | ICD-10-CM | POA: Diagnosis not present

## 2020-06-30 DIAGNOSIS — N186 End stage renal disease: Secondary | ICD-10-CM | POA: Diagnosis not present

## 2020-06-30 DIAGNOSIS — Z992 Dependence on renal dialysis: Secondary | ICD-10-CM | POA: Diagnosis not present

## 2020-07-01 DIAGNOSIS — Z992 Dependence on renal dialysis: Secondary | ICD-10-CM | POA: Diagnosis not present

## 2020-07-01 DIAGNOSIS — N186 End stage renal disease: Secondary | ICD-10-CM | POA: Diagnosis not present

## 2020-07-01 DIAGNOSIS — Z298 Encounter for other specified prophylactic measures: Secondary | ICD-10-CM | POA: Diagnosis not present

## 2020-07-01 DIAGNOSIS — D509 Iron deficiency anemia, unspecified: Secondary | ICD-10-CM | POA: Diagnosis not present

## 2020-07-01 DIAGNOSIS — D631 Anemia in chronic kidney disease: Secondary | ICD-10-CM | POA: Diagnosis not present

## 2020-07-02 DIAGNOSIS — N186 End stage renal disease: Secondary | ICD-10-CM | POA: Diagnosis not present

## 2020-07-02 DIAGNOSIS — Z992 Dependence on renal dialysis: Secondary | ICD-10-CM | POA: Diagnosis not present

## 2020-07-02 DIAGNOSIS — D509 Iron deficiency anemia, unspecified: Secondary | ICD-10-CM | POA: Diagnosis not present

## 2020-07-02 DIAGNOSIS — Z298 Encounter for other specified prophylactic measures: Secondary | ICD-10-CM | POA: Diagnosis not present

## 2020-07-02 DIAGNOSIS — D631 Anemia in chronic kidney disease: Secondary | ICD-10-CM | POA: Diagnosis not present

## 2020-07-03 DIAGNOSIS — N186 End stage renal disease: Secondary | ICD-10-CM | POA: Diagnosis not present

## 2020-07-03 DIAGNOSIS — D509 Iron deficiency anemia, unspecified: Secondary | ICD-10-CM | POA: Diagnosis not present

## 2020-07-03 DIAGNOSIS — D631 Anemia in chronic kidney disease: Secondary | ICD-10-CM | POA: Diagnosis not present

## 2020-07-03 DIAGNOSIS — Z298 Encounter for other specified prophylactic measures: Secondary | ICD-10-CM | POA: Diagnosis not present

## 2020-07-03 DIAGNOSIS — Z992 Dependence on renal dialysis: Secondary | ICD-10-CM | POA: Diagnosis not present

## 2020-07-04 DIAGNOSIS — Z992 Dependence on renal dialysis: Secondary | ICD-10-CM | POA: Diagnosis not present

## 2020-07-04 DIAGNOSIS — Z298 Encounter for other specified prophylactic measures: Secondary | ICD-10-CM | POA: Diagnosis not present

## 2020-07-04 DIAGNOSIS — D631 Anemia in chronic kidney disease: Secondary | ICD-10-CM | POA: Diagnosis not present

## 2020-07-04 DIAGNOSIS — D509 Iron deficiency anemia, unspecified: Secondary | ICD-10-CM | POA: Diagnosis not present

## 2020-07-04 DIAGNOSIS — N186 End stage renal disease: Secondary | ICD-10-CM | POA: Diagnosis not present

## 2020-07-05 DIAGNOSIS — N186 End stage renal disease: Secondary | ICD-10-CM | POA: Diagnosis not present

## 2020-07-05 DIAGNOSIS — Z298 Encounter for other specified prophylactic measures: Secondary | ICD-10-CM | POA: Diagnosis not present

## 2020-07-05 DIAGNOSIS — Z992 Dependence on renal dialysis: Secondary | ICD-10-CM | POA: Diagnosis not present

## 2020-07-05 DIAGNOSIS — D631 Anemia in chronic kidney disease: Secondary | ICD-10-CM | POA: Diagnosis not present

## 2020-07-05 DIAGNOSIS — D509 Iron deficiency anemia, unspecified: Secondary | ICD-10-CM | POA: Diagnosis not present

## 2020-07-06 DIAGNOSIS — D509 Iron deficiency anemia, unspecified: Secondary | ICD-10-CM | POA: Diagnosis not present

## 2020-07-06 DIAGNOSIS — Z992 Dependence on renal dialysis: Secondary | ICD-10-CM | POA: Diagnosis not present

## 2020-07-06 DIAGNOSIS — D631 Anemia in chronic kidney disease: Secondary | ICD-10-CM | POA: Diagnosis not present

## 2020-07-06 DIAGNOSIS — Z298 Encounter for other specified prophylactic measures: Secondary | ICD-10-CM | POA: Diagnosis not present

## 2020-07-06 DIAGNOSIS — N186 End stage renal disease: Secondary | ICD-10-CM | POA: Diagnosis not present

## 2020-07-07 DIAGNOSIS — N186 End stage renal disease: Secondary | ICD-10-CM | POA: Diagnosis not present

## 2020-07-07 DIAGNOSIS — D509 Iron deficiency anemia, unspecified: Secondary | ICD-10-CM | POA: Diagnosis not present

## 2020-07-07 DIAGNOSIS — Z992 Dependence on renal dialysis: Secondary | ICD-10-CM | POA: Diagnosis not present

## 2020-07-07 DIAGNOSIS — D631 Anemia in chronic kidney disease: Secondary | ICD-10-CM | POA: Diagnosis not present

## 2020-07-07 DIAGNOSIS — Z298 Encounter for other specified prophylactic measures: Secondary | ICD-10-CM | POA: Diagnosis not present

## 2020-07-08 DIAGNOSIS — Z992 Dependence on renal dialysis: Secondary | ICD-10-CM | POA: Diagnosis not present

## 2020-07-08 DIAGNOSIS — D631 Anemia in chronic kidney disease: Secondary | ICD-10-CM | POA: Diagnosis not present

## 2020-07-08 DIAGNOSIS — N186 End stage renal disease: Secondary | ICD-10-CM | POA: Diagnosis not present

## 2020-07-08 DIAGNOSIS — Z298 Encounter for other specified prophylactic measures: Secondary | ICD-10-CM | POA: Diagnosis not present

## 2020-07-08 DIAGNOSIS — D509 Iron deficiency anemia, unspecified: Secondary | ICD-10-CM | POA: Diagnosis not present

## 2020-07-09 DIAGNOSIS — Z992 Dependence on renal dialysis: Secondary | ICD-10-CM | POA: Diagnosis not present

## 2020-07-09 DIAGNOSIS — N186 End stage renal disease: Secondary | ICD-10-CM | POA: Diagnosis not present

## 2020-07-09 DIAGNOSIS — D509 Iron deficiency anemia, unspecified: Secondary | ICD-10-CM | POA: Diagnosis not present

## 2020-07-09 DIAGNOSIS — D631 Anemia in chronic kidney disease: Secondary | ICD-10-CM | POA: Diagnosis not present

## 2020-07-09 DIAGNOSIS — Z298 Encounter for other specified prophylactic measures: Secondary | ICD-10-CM | POA: Diagnosis not present

## 2020-07-10 DIAGNOSIS — D509 Iron deficiency anemia, unspecified: Secondary | ICD-10-CM | POA: Diagnosis not present

## 2020-07-10 DIAGNOSIS — D631 Anemia in chronic kidney disease: Secondary | ICD-10-CM | POA: Diagnosis not present

## 2020-07-10 DIAGNOSIS — Z298 Encounter for other specified prophylactic measures: Secondary | ICD-10-CM | POA: Diagnosis not present

## 2020-07-10 DIAGNOSIS — Z992 Dependence on renal dialysis: Secondary | ICD-10-CM | POA: Diagnosis not present

## 2020-07-10 DIAGNOSIS — N186 End stage renal disease: Secondary | ICD-10-CM | POA: Diagnosis not present

## 2020-07-11 DIAGNOSIS — N186 End stage renal disease: Secondary | ICD-10-CM | POA: Diagnosis not present

## 2020-07-11 DIAGNOSIS — D631 Anemia in chronic kidney disease: Secondary | ICD-10-CM | POA: Diagnosis not present

## 2020-07-11 DIAGNOSIS — D509 Iron deficiency anemia, unspecified: Secondary | ICD-10-CM | POA: Diagnosis not present

## 2020-07-11 DIAGNOSIS — Z298 Encounter for other specified prophylactic measures: Secondary | ICD-10-CM | POA: Diagnosis not present

## 2020-07-11 DIAGNOSIS — Z992 Dependence on renal dialysis: Secondary | ICD-10-CM | POA: Diagnosis not present

## 2020-07-12 DIAGNOSIS — Z298 Encounter for other specified prophylactic measures: Secondary | ICD-10-CM | POA: Diagnosis not present

## 2020-07-12 DIAGNOSIS — N186 End stage renal disease: Secondary | ICD-10-CM | POA: Diagnosis not present

## 2020-07-12 DIAGNOSIS — Z992 Dependence on renal dialysis: Secondary | ICD-10-CM | POA: Diagnosis not present

## 2020-07-12 DIAGNOSIS — D631 Anemia in chronic kidney disease: Secondary | ICD-10-CM | POA: Diagnosis not present

## 2020-07-12 DIAGNOSIS — D509 Iron deficiency anemia, unspecified: Secondary | ICD-10-CM | POA: Diagnosis not present

## 2020-07-13 DIAGNOSIS — D631 Anemia in chronic kidney disease: Secondary | ICD-10-CM | POA: Diagnosis not present

## 2020-07-13 DIAGNOSIS — Z992 Dependence on renal dialysis: Secondary | ICD-10-CM | POA: Diagnosis not present

## 2020-07-13 DIAGNOSIS — N186 End stage renal disease: Secondary | ICD-10-CM | POA: Diagnosis not present

## 2020-07-13 DIAGNOSIS — Z298 Encounter for other specified prophylactic measures: Secondary | ICD-10-CM | POA: Diagnosis not present

## 2020-07-13 DIAGNOSIS — D509 Iron deficiency anemia, unspecified: Secondary | ICD-10-CM | POA: Diagnosis not present

## 2020-07-14 ENCOUNTER — Telehealth: Payer: Self-pay

## 2020-07-14 DIAGNOSIS — D509 Iron deficiency anemia, unspecified: Secondary | ICD-10-CM | POA: Diagnosis not present

## 2020-07-14 DIAGNOSIS — D631 Anemia in chronic kidney disease: Secondary | ICD-10-CM | POA: Diagnosis not present

## 2020-07-14 DIAGNOSIS — Z992 Dependence on renal dialysis: Secondary | ICD-10-CM | POA: Diagnosis not present

## 2020-07-14 DIAGNOSIS — N186 End stage renal disease: Secondary | ICD-10-CM | POA: Diagnosis not present

## 2020-07-14 NOTE — Telephone Encounter (Signed)
Copied from [place] [id number]. Topic: General - Inquiry >> Jul 14, 2020  1:16 PM Holley Dexter N wrote: Reason for CRM: Patient called in stating both medications, levocetirizine (XYZAL) 5 MG tablet and gabapentin (NEURONTIN) 100 MG capsule, made him sleepy, and has stopped taking them, pt mentioned possible acupuncture therapy

## 2020-07-14 NOTE — Telephone Encounter (Signed)
Please see message below

## 2020-07-15 DIAGNOSIS — N186 End stage renal disease: Secondary | ICD-10-CM | POA: Diagnosis not present

## 2020-07-15 DIAGNOSIS — D509 Iron deficiency anemia, unspecified: Secondary | ICD-10-CM | POA: Diagnosis not present

## 2020-07-15 DIAGNOSIS — Z992 Dependence on renal dialysis: Secondary | ICD-10-CM | POA: Diagnosis not present

## 2020-07-15 DIAGNOSIS — D631 Anemia in chronic kidney disease: Secondary | ICD-10-CM | POA: Diagnosis not present

## 2020-07-16 DIAGNOSIS — N186 End stage renal disease: Secondary | ICD-10-CM | POA: Diagnosis not present

## 2020-07-16 DIAGNOSIS — D509 Iron deficiency anemia, unspecified: Secondary | ICD-10-CM | POA: Diagnosis not present

## 2020-07-16 DIAGNOSIS — D631 Anemia in chronic kidney disease: Secondary | ICD-10-CM | POA: Diagnosis not present

## 2020-07-16 DIAGNOSIS — Z992 Dependence on renal dialysis: Secondary | ICD-10-CM | POA: Diagnosis not present

## 2020-07-16 NOTE — Telephone Encounter (Signed)
Noted  

## 2020-07-17 DIAGNOSIS — D509 Iron deficiency anemia, unspecified: Secondary | ICD-10-CM | POA: Diagnosis not present

## 2020-07-17 DIAGNOSIS — D631 Anemia in chronic kidney disease: Secondary | ICD-10-CM | POA: Diagnosis not present

## 2020-07-17 DIAGNOSIS — Z992 Dependence on renal dialysis: Secondary | ICD-10-CM | POA: Diagnosis not present

## 2020-07-17 DIAGNOSIS — N186 End stage renal disease: Secondary | ICD-10-CM | POA: Diagnosis not present

## 2020-07-18 DIAGNOSIS — D631 Anemia in chronic kidney disease: Secondary | ICD-10-CM | POA: Diagnosis not present

## 2020-07-18 DIAGNOSIS — D509 Iron deficiency anemia, unspecified: Secondary | ICD-10-CM | POA: Diagnosis not present

## 2020-07-18 DIAGNOSIS — Z992 Dependence on renal dialysis: Secondary | ICD-10-CM | POA: Diagnosis not present

## 2020-07-18 DIAGNOSIS — N186 End stage renal disease: Secondary | ICD-10-CM | POA: Diagnosis not present

## 2020-07-19 DIAGNOSIS — D509 Iron deficiency anemia, unspecified: Secondary | ICD-10-CM | POA: Diagnosis not present

## 2020-07-19 DIAGNOSIS — D631 Anemia in chronic kidney disease: Secondary | ICD-10-CM | POA: Diagnosis not present

## 2020-07-19 DIAGNOSIS — N186 End stage renal disease: Secondary | ICD-10-CM | POA: Diagnosis not present

## 2020-07-19 DIAGNOSIS — Z992 Dependence on renal dialysis: Secondary | ICD-10-CM | POA: Diagnosis not present

## 2020-07-20 DIAGNOSIS — Z992 Dependence on renal dialysis: Secondary | ICD-10-CM | POA: Diagnosis not present

## 2020-07-20 DIAGNOSIS — D509 Iron deficiency anemia, unspecified: Secondary | ICD-10-CM | POA: Diagnosis not present

## 2020-07-20 DIAGNOSIS — D631 Anemia in chronic kidney disease: Secondary | ICD-10-CM | POA: Diagnosis not present

## 2020-07-20 DIAGNOSIS — N186 End stage renal disease: Secondary | ICD-10-CM | POA: Diagnosis not present

## 2020-07-21 DIAGNOSIS — D631 Anemia in chronic kidney disease: Secondary | ICD-10-CM | POA: Diagnosis not present

## 2020-07-21 DIAGNOSIS — D509 Iron deficiency anemia, unspecified: Secondary | ICD-10-CM | POA: Diagnosis not present

## 2020-07-21 DIAGNOSIS — N186 End stage renal disease: Secondary | ICD-10-CM | POA: Diagnosis not present

## 2020-07-21 DIAGNOSIS — Z992 Dependence on renal dialysis: Secondary | ICD-10-CM | POA: Diagnosis not present

## 2020-07-22 DIAGNOSIS — N186 End stage renal disease: Secondary | ICD-10-CM | POA: Diagnosis not present

## 2020-07-22 DIAGNOSIS — D509 Iron deficiency anemia, unspecified: Secondary | ICD-10-CM | POA: Diagnosis not present

## 2020-07-22 DIAGNOSIS — D631 Anemia in chronic kidney disease: Secondary | ICD-10-CM | POA: Diagnosis not present

## 2020-07-22 DIAGNOSIS — Z992 Dependence on renal dialysis: Secondary | ICD-10-CM | POA: Diagnosis not present

## 2020-07-23 DIAGNOSIS — D509 Iron deficiency anemia, unspecified: Secondary | ICD-10-CM | POA: Diagnosis not present

## 2020-07-23 DIAGNOSIS — D631 Anemia in chronic kidney disease: Secondary | ICD-10-CM | POA: Diagnosis not present

## 2020-07-23 DIAGNOSIS — Z992 Dependence on renal dialysis: Secondary | ICD-10-CM | POA: Diagnosis not present

## 2020-07-23 DIAGNOSIS — N186 End stage renal disease: Secondary | ICD-10-CM | POA: Diagnosis not present

## 2020-07-24 DIAGNOSIS — D631 Anemia in chronic kidney disease: Secondary | ICD-10-CM | POA: Diagnosis not present

## 2020-07-24 DIAGNOSIS — Z992 Dependence on renal dialysis: Secondary | ICD-10-CM | POA: Diagnosis not present

## 2020-07-24 DIAGNOSIS — N186 End stage renal disease: Secondary | ICD-10-CM | POA: Diagnosis not present

## 2020-07-24 DIAGNOSIS — D509 Iron deficiency anemia, unspecified: Secondary | ICD-10-CM | POA: Diagnosis not present

## 2020-07-25 DIAGNOSIS — D631 Anemia in chronic kidney disease: Secondary | ICD-10-CM | POA: Diagnosis not present

## 2020-07-25 DIAGNOSIS — Z992 Dependence on renal dialysis: Secondary | ICD-10-CM | POA: Diagnosis not present

## 2020-07-25 DIAGNOSIS — D509 Iron deficiency anemia, unspecified: Secondary | ICD-10-CM | POA: Diagnosis not present

## 2020-07-25 DIAGNOSIS — N186 End stage renal disease: Secondary | ICD-10-CM | POA: Diagnosis not present

## 2020-07-26 DIAGNOSIS — Z992 Dependence on renal dialysis: Secondary | ICD-10-CM | POA: Diagnosis not present

## 2020-07-26 DIAGNOSIS — D509 Iron deficiency anemia, unspecified: Secondary | ICD-10-CM | POA: Diagnosis not present

## 2020-07-26 DIAGNOSIS — D631 Anemia in chronic kidney disease: Secondary | ICD-10-CM | POA: Diagnosis not present

## 2020-07-26 DIAGNOSIS — N186 End stage renal disease: Secondary | ICD-10-CM | POA: Diagnosis not present

## 2020-07-27 DIAGNOSIS — Z992 Dependence on renal dialysis: Secondary | ICD-10-CM | POA: Diagnosis not present

## 2020-07-27 DIAGNOSIS — D509 Iron deficiency anemia, unspecified: Secondary | ICD-10-CM | POA: Diagnosis not present

## 2020-07-27 DIAGNOSIS — N186 End stage renal disease: Secondary | ICD-10-CM | POA: Diagnosis not present

## 2020-07-27 DIAGNOSIS — D631 Anemia in chronic kidney disease: Secondary | ICD-10-CM | POA: Diagnosis not present

## 2020-07-28 DIAGNOSIS — D509 Iron deficiency anemia, unspecified: Secondary | ICD-10-CM | POA: Diagnosis not present

## 2020-07-28 DIAGNOSIS — Z992 Dependence on renal dialysis: Secondary | ICD-10-CM | POA: Diagnosis not present

## 2020-07-28 DIAGNOSIS — N186 End stage renal disease: Secondary | ICD-10-CM | POA: Diagnosis not present

## 2020-07-28 DIAGNOSIS — D631 Anemia in chronic kidney disease: Secondary | ICD-10-CM | POA: Diagnosis not present

## 2020-07-29 ENCOUNTER — Ambulatory Visit: Payer: Medicare Other | Admitting: Family Medicine

## 2020-07-29 DIAGNOSIS — D631 Anemia in chronic kidney disease: Secondary | ICD-10-CM | POA: Diagnosis not present

## 2020-07-29 DIAGNOSIS — Z992 Dependence on renal dialysis: Secondary | ICD-10-CM | POA: Diagnosis not present

## 2020-07-29 DIAGNOSIS — N186 End stage renal disease: Secondary | ICD-10-CM | POA: Diagnosis not present

## 2020-07-29 DIAGNOSIS — D509 Iron deficiency anemia, unspecified: Secondary | ICD-10-CM | POA: Diagnosis not present

## 2020-07-30 DIAGNOSIS — Z992 Dependence on renal dialysis: Secondary | ICD-10-CM | POA: Diagnosis not present

## 2020-07-30 DIAGNOSIS — D509 Iron deficiency anemia, unspecified: Secondary | ICD-10-CM | POA: Diagnosis not present

## 2020-07-30 DIAGNOSIS — D631 Anemia in chronic kidney disease: Secondary | ICD-10-CM | POA: Diagnosis not present

## 2020-07-30 DIAGNOSIS — N186 End stage renal disease: Secondary | ICD-10-CM | POA: Diagnosis not present

## 2020-07-31 DIAGNOSIS — Z992 Dependence on renal dialysis: Secondary | ICD-10-CM | POA: Diagnosis not present

## 2020-07-31 DIAGNOSIS — N186 End stage renal disease: Secondary | ICD-10-CM | POA: Diagnosis not present

## 2020-07-31 DIAGNOSIS — D509 Iron deficiency anemia, unspecified: Secondary | ICD-10-CM | POA: Diagnosis not present

## 2020-07-31 DIAGNOSIS — D631 Anemia in chronic kidney disease: Secondary | ICD-10-CM | POA: Diagnosis not present

## 2020-08-01 DIAGNOSIS — Z992 Dependence on renal dialysis: Secondary | ICD-10-CM | POA: Diagnosis not present

## 2020-08-01 DIAGNOSIS — D509 Iron deficiency anemia, unspecified: Secondary | ICD-10-CM | POA: Diagnosis not present

## 2020-08-01 DIAGNOSIS — D631 Anemia in chronic kidney disease: Secondary | ICD-10-CM | POA: Diagnosis not present

## 2020-08-01 DIAGNOSIS — N186 End stage renal disease: Secondary | ICD-10-CM | POA: Diagnosis not present

## 2020-08-02 ENCOUNTER — Other Ambulatory Visit: Payer: Self-pay

## 2020-08-02 ENCOUNTER — Ambulatory Visit (INDEPENDENT_AMBULATORY_CARE_PROVIDER_SITE_OTHER): Payer: Medicare Other | Admitting: Family Medicine

## 2020-08-02 ENCOUNTER — Encounter: Payer: Self-pay | Admitting: Family Medicine

## 2020-08-02 VITALS — BP 112/69 | HR 69 | Temp 97.9°F | Wt 131.0 lb

## 2020-08-02 DIAGNOSIS — L989 Disorder of the skin and subcutaneous tissue, unspecified: Secondary | ICD-10-CM

## 2020-08-02 DIAGNOSIS — I25709 Atherosclerosis of coronary artery bypass graft(s), unspecified, with unspecified angina pectoris: Secondary | ICD-10-CM | POA: Diagnosis not present

## 2020-08-02 DIAGNOSIS — N186 End stage renal disease: Secondary | ICD-10-CM | POA: Diagnosis not present

## 2020-08-02 DIAGNOSIS — D631 Anemia in chronic kidney disease: Secondary | ICD-10-CM | POA: Diagnosis not present

## 2020-08-02 DIAGNOSIS — L299 Pruritus, unspecified: Secondary | ICD-10-CM

## 2020-08-02 DIAGNOSIS — Z992 Dependence on renal dialysis: Secondary | ICD-10-CM | POA: Diagnosis not present

## 2020-08-02 DIAGNOSIS — D509 Iron deficiency anemia, unspecified: Secondary | ICD-10-CM | POA: Diagnosis not present

## 2020-08-02 NOTE — Progress Notes (Signed)
BP 112/69   Pulse 69   Temp 97.9 F (36.6 C)   Wt 131 lb (59.4 kg)   SpO2 100%   BMI 22.37 kg/m    Subjective:    Patient ID: Ray Lamb, male    DOB: 1930-05-17, 87 y.o.   MRN: [id number]  HPI: Ray Lamb is a 87 y.o. male  Chief Complaint  Patient presents with   Pruritis   skin concern    Patient states he noticed a blister on his lower left leg. Patient states the blister has opened.   Was not able to tolerate the gabapentin for his itching. Was too tired and had some issues with balance. He would like to try accupuncture. He is otherwise feeling pretty well.   SKIN LESION Duration: couple of weeks Location: R anterior shin Painful: yes Itching: no Onset: sudden Context: bigger Associated signs and symptoms: none History of skin cancer: yes History of precancerous skin lesions: yes   Relevant past medical, surgical, family and social history reviewed and updated as indicated. Interim medical history since our last visit reviewed. Allergies and medications reviewed and updated.  Review of Systems  Constitutional: Negative.   Respiratory: Negative.    Cardiovascular: Negative.   Gastrointestinal: Negative.   Musculoskeletal: Negative.   Skin:  Positive for wound. Negative for color change, pallor and rash.  Psychiatric/Behavioral: Negative.     Per HPI unless specifically indicated above     Objective:    BP 112/69   Pulse 69   Temp 97.9 F (36.6 C)   Wt 131 lb (59.4 kg)   SpO2 100%   BMI 22.37 kg/m   Wt Readings from Last 3 Encounters:  08/02/20 131 lb (59.4 kg)  06/25/20 133 lb 6.4 oz (60.5 kg)  03/15/20 140 lb (63.5 kg)    Physical Exam Vitals and nursing note reviewed.  Constitutional:      General: He is not in acute distress.    Appearance: Normal appearance. He is not ill-appearing, toxic-appearing or diaphoretic.  HENT:     Head: Normocephalic and atraumatic.     Right Ear: External ear normal.     Left Ear: External ear normal.      Nose: Nose normal.     Mouth/Throat:     Mouth: Mucous membranes are moist.     Pharynx: Oropharynx is clear.  Eyes:     General: No scleral icterus.       Right eye: No discharge.        Left eye: No discharge.     Extraocular Movements: Extraocular movements intact.     Conjunctiva/sclera: Conjunctivae normal.     Pupils: Pupils are equal, round, and reactive to light.  Cardiovascular:     Rate and Rhythm: Normal rate and regular rhythm.     Pulses: Normal pulses.     Heart sounds: Normal heart sounds. No murmur heard.   No friction rub. No gallop.  Pulmonary:     Effort: Pulmonary effort is normal. No respiratory distress.     Breath sounds: Normal breath sounds. No stridor. No wheezing, rhonchi or rales.  Chest:     Chest wall: No tenderness.  Musculoskeletal:        General: Normal range of motion.     Cervical back: Normal range of motion and neck supple.  Skin:    General: Skin is warm and dry.     Capillary Refill: Capillary refill takes less than 2 seconds.  Coloration: Skin is not jaundiced or pale.     Findings: No bruising, erythema, lesion or rash.       Neurological:     General: No focal deficit present.     Mental Status: He is alert and oriented to person, place, and time. Mental status is at baseline.  Psychiatric:        Mood and Affect: Mood normal.        Behavior: Behavior normal.        Thought Content: Thought content normal.        Judgment: Judgment normal.    Results for orders placed or performed in visit on 06/25/20  Bayer DCA Hb A1c Waived  Result Value Ref Range   HB A1C (BAYER DCA - WAIVED) 6.8 <7.0 %  CBC with Differential/Platelet  Result Value Ref Range   WBC 9.4 3.4 - 10.8 x10E3/uL   RBC 3.95 (L) 4.14 - 5.80 x10E6/uL   Hemoglobin 12.5 (L) 13.0 - 17.7 g/dL   Hematocrit 38.5 37.5 - 51.0 %   MCV 98 (H) 79 - 97 fL   MCH 31.6 26.6 - 33.0 pg   MCHC 32.5 31.5 - 35.7 g/dL   RDW 11.7 11.6 - 15.4 %   Platelets 231 150 - 450  x10E3/uL   Neutrophils 51 Not Estab. %   Lymphs 32 Not Estab. %   Monocytes 11 Not Estab. %   Eos 4 Not Estab. %   Basos 1 Not Estab. %   Neutrophils Absolute 4.9 1.4 - 7.0 x10E3/uL   Lymphocytes Absolute 3.0 0.7 - 3.1 x10E3/uL   Monocytes Absolute 1.1 (H) 0.1 - 0.9 x10E3/uL   EOS (ABSOLUTE) 0.4 0.0 - 0.4 x10E3/uL   Basophils Absolute 0.1 0.0 - 0.2 x10E3/uL   Immature Granulocytes 1 Not Estab. %   Immature Grans (Abs) 0.1 0.0 - 0.1 x10E3/uL  Comprehensive metabolic panel  Result Value Ref Range   Glucose 110 (H) 65 - 99 mg/dL   BUN 55 (H) 8 - 27 mg/dL   Creatinine, Ser 8.68 (H) 0.76 - 1.27 mg/dL   eGFR 5 (L) >59 mL/min/1.73   BUN/Creatinine Ratio 6 (L) 10 - 24   Sodium 136 134 - 144 mmol/L   Potassium 4.6 3.5 - 5.2 mmol/L   Chloride 92 (L) 96 - 106 mmol/L   CO2 25 20 - 29 mmol/L   Calcium 9.0 8.6 - 10.2 mg/dL   Total Protein 8.1 6.0 - 8.5 g/dL   Albumin 3.5 (L) 3.6 - 4.6 g/dL   Globulin, Total 4.6 (H) 1.5 - 4.5 g/dL   Albumin/Globulin Ratio 0.8 (L) 1.2 - 2.2   Bilirubin Total 0.4 0.0 - 1.2 mg/dL   Alkaline Phosphatase 101 44 - 121 IU/L   AST 17 0 - 40 IU/L   ALT 13 0 - 44 IU/L  Lipid Panel w/o Chol/HDL Ratio  Result Value Ref Range   Cholesterol, Total 120 100 - 199 mg/dL   Triglycerides 116 0 - 149 mg/dL   HDL 31 (L) >39 mg/dL   VLDL Cholesterol Cal 21 5 - 40 mg/dL   LDL Chol Calc (NIH) 68 0 - 99 mg/dL      Assessment & Plan:   Problem List Items Addressed This Visit   None Visit Diagnoses     Skin lesion of right leg    -  Primary   Will try to get him in with dermatology ASAP.    Itching       Unable to  tolerate his gapentin. Will try accupuncture. Call with any concerns.         Follow up plan: Return in about 2 months (around 10/02/2020).

## 2020-08-03 DIAGNOSIS — N186 End stage renal disease: Secondary | ICD-10-CM | POA: Diagnosis not present

## 2020-08-03 DIAGNOSIS — D631 Anemia in chronic kidney disease: Secondary | ICD-10-CM | POA: Diagnosis not present

## 2020-08-03 DIAGNOSIS — D509 Iron deficiency anemia, unspecified: Secondary | ICD-10-CM | POA: Diagnosis not present

## 2020-08-03 DIAGNOSIS — Z992 Dependence on renal dialysis: Secondary | ICD-10-CM | POA: Diagnosis not present

## 2020-08-04 DIAGNOSIS — D509 Iron deficiency anemia, unspecified: Secondary | ICD-10-CM | POA: Diagnosis not present

## 2020-08-04 DIAGNOSIS — N186 End stage renal disease: Secondary | ICD-10-CM | POA: Diagnosis not present

## 2020-08-04 DIAGNOSIS — Z992 Dependence on renal dialysis: Secondary | ICD-10-CM | POA: Diagnosis not present

## 2020-08-04 DIAGNOSIS — D631 Anemia in chronic kidney disease: Secondary | ICD-10-CM | POA: Diagnosis not present

## 2020-08-05 DIAGNOSIS — Z992 Dependence on renal dialysis: Secondary | ICD-10-CM | POA: Diagnosis not present

## 2020-08-05 DIAGNOSIS — D631 Anemia in chronic kidney disease: Secondary | ICD-10-CM | POA: Diagnosis not present

## 2020-08-05 DIAGNOSIS — N186 End stage renal disease: Secondary | ICD-10-CM | POA: Diagnosis not present

## 2020-08-05 DIAGNOSIS — D509 Iron deficiency anemia, unspecified: Secondary | ICD-10-CM | POA: Diagnosis not present

## 2020-08-05 DIAGNOSIS — L309 Dermatitis, unspecified: Secondary | ICD-10-CM | POA: Diagnosis not present

## 2020-08-06 DIAGNOSIS — D509 Iron deficiency anemia, unspecified: Secondary | ICD-10-CM | POA: Diagnosis not present

## 2020-08-06 DIAGNOSIS — D631 Anemia in chronic kidney disease: Secondary | ICD-10-CM | POA: Diagnosis not present

## 2020-08-06 DIAGNOSIS — N186 End stage renal disease: Secondary | ICD-10-CM | POA: Diagnosis not present

## 2020-08-06 DIAGNOSIS — Z992 Dependence on renal dialysis: Secondary | ICD-10-CM | POA: Diagnosis not present

## 2020-08-07 DIAGNOSIS — D509 Iron deficiency anemia, unspecified: Secondary | ICD-10-CM | POA: Diagnosis not present

## 2020-08-07 DIAGNOSIS — Z992 Dependence on renal dialysis: Secondary | ICD-10-CM | POA: Diagnosis not present

## 2020-08-07 DIAGNOSIS — N186 End stage renal disease: Secondary | ICD-10-CM | POA: Diagnosis not present

## 2020-08-07 DIAGNOSIS — D631 Anemia in chronic kidney disease: Secondary | ICD-10-CM | POA: Diagnosis not present

## 2020-08-08 DIAGNOSIS — N186 End stage renal disease: Secondary | ICD-10-CM | POA: Diagnosis not present

## 2020-08-08 DIAGNOSIS — D509 Iron deficiency anemia, unspecified: Secondary | ICD-10-CM | POA: Diagnosis not present

## 2020-08-08 DIAGNOSIS — Z992 Dependence on renal dialysis: Secondary | ICD-10-CM | POA: Diagnosis not present

## 2020-08-08 DIAGNOSIS — D631 Anemia in chronic kidney disease: Secondary | ICD-10-CM | POA: Diagnosis not present

## 2020-08-09 DIAGNOSIS — D509 Iron deficiency anemia, unspecified: Secondary | ICD-10-CM | POA: Diagnosis not present

## 2020-08-09 DIAGNOSIS — Z992 Dependence on renal dialysis: Secondary | ICD-10-CM | POA: Diagnosis not present

## 2020-08-09 DIAGNOSIS — N186 End stage renal disease: Secondary | ICD-10-CM | POA: Diagnosis not present

## 2020-08-09 DIAGNOSIS — D631 Anemia in chronic kidney disease: Secondary | ICD-10-CM | POA: Diagnosis not present

## 2020-08-10 DIAGNOSIS — D509 Iron deficiency anemia, unspecified: Secondary | ICD-10-CM | POA: Diagnosis not present

## 2020-08-10 DIAGNOSIS — Z992 Dependence on renal dialysis: Secondary | ICD-10-CM | POA: Diagnosis not present

## 2020-08-10 DIAGNOSIS — D631 Anemia in chronic kidney disease: Secondary | ICD-10-CM | POA: Diagnosis not present

## 2020-08-10 DIAGNOSIS — N186 End stage renal disease: Secondary | ICD-10-CM | POA: Diagnosis not present

## 2020-08-11 DIAGNOSIS — D631 Anemia in chronic kidney disease: Secondary | ICD-10-CM | POA: Diagnosis not present

## 2020-08-11 DIAGNOSIS — Z992 Dependence on renal dialysis: Secondary | ICD-10-CM | POA: Diagnosis not present

## 2020-08-11 DIAGNOSIS — N186 End stage renal disease: Secondary | ICD-10-CM | POA: Diagnosis not present

## 2020-08-11 DIAGNOSIS — D509 Iron deficiency anemia, unspecified: Secondary | ICD-10-CM | POA: Diagnosis not present

## 2020-08-12 DIAGNOSIS — N186 End stage renal disease: Secondary | ICD-10-CM | POA: Diagnosis not present

## 2020-08-12 DIAGNOSIS — Z992 Dependence on renal dialysis: Secondary | ICD-10-CM | POA: Diagnosis not present

## 2020-08-12 DIAGNOSIS — D509 Iron deficiency anemia, unspecified: Secondary | ICD-10-CM | POA: Diagnosis not present

## 2020-08-12 DIAGNOSIS — D631 Anemia in chronic kidney disease: Secondary | ICD-10-CM | POA: Diagnosis not present

## 2020-08-13 DIAGNOSIS — Z992 Dependence on renal dialysis: Secondary | ICD-10-CM | POA: Diagnosis not present

## 2020-08-13 DIAGNOSIS — N186 End stage renal disease: Secondary | ICD-10-CM | POA: Diagnosis not present

## 2020-08-13 DIAGNOSIS — E559 Vitamin D deficiency, unspecified: Secondary | ICD-10-CM | POA: Diagnosis not present

## 2020-08-13 DIAGNOSIS — E538 Deficiency of other specified B group vitamins: Secondary | ICD-10-CM | POA: Diagnosis not present

## 2020-08-13 DIAGNOSIS — D631 Anemia in chronic kidney disease: Secondary | ICD-10-CM | POA: Diagnosis not present

## 2020-08-13 DIAGNOSIS — D509 Iron deficiency anemia, unspecified: Secondary | ICD-10-CM | POA: Diagnosis not present

## 2020-08-13 DIAGNOSIS — N2581 Secondary hyperparathyroidism of renal origin: Secondary | ICD-10-CM | POA: Diagnosis not present

## 2020-08-14 DIAGNOSIS — D631 Anemia in chronic kidney disease: Secondary | ICD-10-CM | POA: Diagnosis not present

## 2020-08-14 DIAGNOSIS — Z992 Dependence on renal dialysis: Secondary | ICD-10-CM | POA: Diagnosis not present

## 2020-08-14 DIAGNOSIS — E559 Vitamin D deficiency, unspecified: Secondary | ICD-10-CM | POA: Diagnosis not present

## 2020-08-14 DIAGNOSIS — E538 Deficiency of other specified B group vitamins: Secondary | ICD-10-CM | POA: Diagnosis not present

## 2020-08-14 DIAGNOSIS — D509 Iron deficiency anemia, unspecified: Secondary | ICD-10-CM | POA: Diagnosis not present

## 2020-08-14 DIAGNOSIS — N186 End stage renal disease: Secondary | ICD-10-CM | POA: Diagnosis not present

## 2020-08-15 DIAGNOSIS — Z992 Dependence on renal dialysis: Secondary | ICD-10-CM | POA: Diagnosis not present

## 2020-08-15 DIAGNOSIS — E538 Deficiency of other specified B group vitamins: Secondary | ICD-10-CM | POA: Diagnosis not present

## 2020-08-15 DIAGNOSIS — D509 Iron deficiency anemia, unspecified: Secondary | ICD-10-CM | POA: Diagnosis not present

## 2020-08-15 DIAGNOSIS — E559 Vitamin D deficiency, unspecified: Secondary | ICD-10-CM | POA: Diagnosis not present

## 2020-08-15 DIAGNOSIS — D631 Anemia in chronic kidney disease: Secondary | ICD-10-CM | POA: Diagnosis not present

## 2020-08-15 DIAGNOSIS — N186 End stage renal disease: Secondary | ICD-10-CM | POA: Diagnosis not present

## 2020-08-16 DIAGNOSIS — D509 Iron deficiency anemia, unspecified: Secondary | ICD-10-CM | POA: Diagnosis not present

## 2020-08-16 DIAGNOSIS — E559 Vitamin D deficiency, unspecified: Secondary | ICD-10-CM | POA: Diagnosis not present

## 2020-08-16 DIAGNOSIS — E538 Deficiency of other specified B group vitamins: Secondary | ICD-10-CM | POA: Diagnosis not present

## 2020-08-16 DIAGNOSIS — Z992 Dependence on renal dialysis: Secondary | ICD-10-CM | POA: Diagnosis not present

## 2020-08-16 DIAGNOSIS — N186 End stage renal disease: Secondary | ICD-10-CM | POA: Diagnosis not present

## 2020-08-16 DIAGNOSIS — D631 Anemia in chronic kidney disease: Secondary | ICD-10-CM | POA: Diagnosis not present

## 2020-08-17 DIAGNOSIS — N186 End stage renal disease: Secondary | ICD-10-CM | POA: Diagnosis not present

## 2020-08-17 DIAGNOSIS — D509 Iron deficiency anemia, unspecified: Secondary | ICD-10-CM | POA: Diagnosis not present

## 2020-08-17 DIAGNOSIS — E538 Deficiency of other specified B group vitamins: Secondary | ICD-10-CM | POA: Diagnosis not present

## 2020-08-17 DIAGNOSIS — D631 Anemia in chronic kidney disease: Secondary | ICD-10-CM | POA: Diagnosis not present

## 2020-08-17 DIAGNOSIS — E559 Vitamin D deficiency, unspecified: Secondary | ICD-10-CM | POA: Diagnosis not present

## 2020-08-17 DIAGNOSIS — Z992 Dependence on renal dialysis: Secondary | ICD-10-CM | POA: Diagnosis not present

## 2020-08-18 DIAGNOSIS — D631 Anemia in chronic kidney disease: Secondary | ICD-10-CM | POA: Diagnosis not present

## 2020-08-18 DIAGNOSIS — E538 Deficiency of other specified B group vitamins: Secondary | ICD-10-CM | POA: Diagnosis not present

## 2020-08-18 DIAGNOSIS — E559 Vitamin D deficiency, unspecified: Secondary | ICD-10-CM | POA: Diagnosis not present

## 2020-08-18 DIAGNOSIS — D509 Iron deficiency anemia, unspecified: Secondary | ICD-10-CM | POA: Diagnosis not present

## 2020-08-18 DIAGNOSIS — N186 End stage renal disease: Secondary | ICD-10-CM | POA: Diagnosis not present

## 2020-08-18 DIAGNOSIS — Z992 Dependence on renal dialysis: Secondary | ICD-10-CM | POA: Diagnosis not present

## 2020-08-19 DIAGNOSIS — E538 Deficiency of other specified B group vitamins: Secondary | ICD-10-CM | POA: Diagnosis not present

## 2020-08-19 DIAGNOSIS — N186 End stage renal disease: Secondary | ICD-10-CM | POA: Diagnosis not present

## 2020-08-19 DIAGNOSIS — D509 Iron deficiency anemia, unspecified: Secondary | ICD-10-CM | POA: Diagnosis not present

## 2020-08-19 DIAGNOSIS — D631 Anemia in chronic kidney disease: Secondary | ICD-10-CM | POA: Diagnosis not present

## 2020-08-19 DIAGNOSIS — E559 Vitamin D deficiency, unspecified: Secondary | ICD-10-CM | POA: Diagnosis not present

## 2020-08-19 DIAGNOSIS — Z992 Dependence on renal dialysis: Secondary | ICD-10-CM | POA: Diagnosis not present

## 2020-08-20 DIAGNOSIS — Z992 Dependence on renal dialysis: Secondary | ICD-10-CM | POA: Diagnosis not present

## 2020-08-20 DIAGNOSIS — E538 Deficiency of other specified B group vitamins: Secondary | ICD-10-CM | POA: Diagnosis not present

## 2020-08-20 DIAGNOSIS — D509 Iron deficiency anemia, unspecified: Secondary | ICD-10-CM | POA: Diagnosis not present

## 2020-08-20 DIAGNOSIS — N186 End stage renal disease: Secondary | ICD-10-CM | POA: Diagnosis not present

## 2020-08-20 DIAGNOSIS — E559 Vitamin D deficiency, unspecified: Secondary | ICD-10-CM | POA: Diagnosis not present

## 2020-08-20 DIAGNOSIS — D631 Anemia in chronic kidney disease: Secondary | ICD-10-CM | POA: Diagnosis not present

## 2020-08-21 DIAGNOSIS — D631 Anemia in chronic kidney disease: Secondary | ICD-10-CM | POA: Diagnosis not present

## 2020-08-21 DIAGNOSIS — E538 Deficiency of other specified B group vitamins: Secondary | ICD-10-CM | POA: Diagnosis not present

## 2020-08-21 DIAGNOSIS — Z992 Dependence on renal dialysis: Secondary | ICD-10-CM | POA: Diagnosis not present

## 2020-08-21 DIAGNOSIS — N186 End stage renal disease: Secondary | ICD-10-CM | POA: Diagnosis not present

## 2020-08-21 DIAGNOSIS — E559 Vitamin D deficiency, unspecified: Secondary | ICD-10-CM | POA: Diagnosis not present

## 2020-08-21 DIAGNOSIS — D509 Iron deficiency anemia, unspecified: Secondary | ICD-10-CM | POA: Diagnosis not present

## 2020-08-22 DIAGNOSIS — Z992 Dependence on renal dialysis: Secondary | ICD-10-CM | POA: Diagnosis not present

## 2020-08-22 DIAGNOSIS — E538 Deficiency of other specified B group vitamins: Secondary | ICD-10-CM | POA: Diagnosis not present

## 2020-08-22 DIAGNOSIS — E559 Vitamin D deficiency, unspecified: Secondary | ICD-10-CM | POA: Diagnosis not present

## 2020-08-22 DIAGNOSIS — D509 Iron deficiency anemia, unspecified: Secondary | ICD-10-CM | POA: Diagnosis not present

## 2020-08-22 DIAGNOSIS — N186 End stage renal disease: Secondary | ICD-10-CM | POA: Diagnosis not present

## 2020-08-22 DIAGNOSIS — D631 Anemia in chronic kidney disease: Secondary | ICD-10-CM | POA: Diagnosis not present

## 2020-08-23 DIAGNOSIS — D509 Iron deficiency anemia, unspecified: Secondary | ICD-10-CM | POA: Diagnosis not present

## 2020-08-23 DIAGNOSIS — E538 Deficiency of other specified B group vitamins: Secondary | ICD-10-CM | POA: Diagnosis not present

## 2020-08-23 DIAGNOSIS — N186 End stage renal disease: Secondary | ICD-10-CM | POA: Diagnosis not present

## 2020-08-23 DIAGNOSIS — E559 Vitamin D deficiency, unspecified: Secondary | ICD-10-CM | POA: Diagnosis not present

## 2020-08-23 DIAGNOSIS — D631 Anemia in chronic kidney disease: Secondary | ICD-10-CM | POA: Diagnosis not present

## 2020-08-23 DIAGNOSIS — Z992 Dependence on renal dialysis: Secondary | ICD-10-CM | POA: Diagnosis not present

## 2020-08-24 DIAGNOSIS — D631 Anemia in chronic kidney disease: Secondary | ICD-10-CM | POA: Diagnosis not present

## 2020-08-24 DIAGNOSIS — E538 Deficiency of other specified B group vitamins: Secondary | ICD-10-CM | POA: Diagnosis not present

## 2020-08-24 DIAGNOSIS — D509 Iron deficiency anemia, unspecified: Secondary | ICD-10-CM | POA: Diagnosis not present

## 2020-08-24 DIAGNOSIS — I259 Chronic ischemic heart disease, unspecified: Secondary | ICD-10-CM | POA: Diagnosis not present

## 2020-08-24 DIAGNOSIS — E119 Type 2 diabetes mellitus without complications: Secondary | ICD-10-CM | POA: Diagnosis not present

## 2020-08-24 DIAGNOSIS — Z992 Dependence on renal dialysis: Secondary | ICD-10-CM | POA: Diagnosis not present

## 2020-08-24 DIAGNOSIS — N186 End stage renal disease: Secondary | ICD-10-CM | POA: Diagnosis not present

## 2020-08-24 DIAGNOSIS — E559 Vitamin D deficiency, unspecified: Secondary | ICD-10-CM | POA: Diagnosis not present

## 2020-08-25 ENCOUNTER — Encounter: Payer: Self-pay | Admitting: Family Medicine

## 2020-08-25 DIAGNOSIS — D631 Anemia in chronic kidney disease: Secondary | ICD-10-CM | POA: Diagnosis not present

## 2020-08-25 DIAGNOSIS — E538 Deficiency of other specified B group vitamins: Secondary | ICD-10-CM | POA: Diagnosis not present

## 2020-08-25 DIAGNOSIS — E559 Vitamin D deficiency, unspecified: Secondary | ICD-10-CM | POA: Diagnosis not present

## 2020-08-25 DIAGNOSIS — N186 End stage renal disease: Secondary | ICD-10-CM | POA: Diagnosis not present

## 2020-08-25 DIAGNOSIS — D509 Iron deficiency anemia, unspecified: Secondary | ICD-10-CM | POA: Diagnosis not present

## 2020-08-25 DIAGNOSIS — Z992 Dependence on renal dialysis: Secondary | ICD-10-CM | POA: Diagnosis not present

## 2020-08-26 DIAGNOSIS — E559 Vitamin D deficiency, unspecified: Secondary | ICD-10-CM | POA: Diagnosis not present

## 2020-08-26 DIAGNOSIS — E538 Deficiency of other specified B group vitamins: Secondary | ICD-10-CM | POA: Diagnosis not present

## 2020-08-26 DIAGNOSIS — N186 End stage renal disease: Secondary | ICD-10-CM | POA: Diagnosis not present

## 2020-08-26 DIAGNOSIS — Z992 Dependence on renal dialysis: Secondary | ICD-10-CM | POA: Diagnosis not present

## 2020-08-26 DIAGNOSIS — D509 Iron deficiency anemia, unspecified: Secondary | ICD-10-CM | POA: Diagnosis not present

## 2020-08-26 DIAGNOSIS — D631 Anemia in chronic kidney disease: Secondary | ICD-10-CM | POA: Diagnosis not present

## 2020-08-27 DIAGNOSIS — Z992 Dependence on renal dialysis: Secondary | ICD-10-CM | POA: Diagnosis not present

## 2020-08-27 DIAGNOSIS — E538 Deficiency of other specified B group vitamins: Secondary | ICD-10-CM | POA: Diagnosis not present

## 2020-08-27 DIAGNOSIS — D509 Iron deficiency anemia, unspecified: Secondary | ICD-10-CM | POA: Diagnosis not present

## 2020-08-27 DIAGNOSIS — D631 Anemia in chronic kidney disease: Secondary | ICD-10-CM | POA: Diagnosis not present

## 2020-08-27 DIAGNOSIS — N186 End stage renal disease: Secondary | ICD-10-CM | POA: Diagnosis not present

## 2020-08-27 DIAGNOSIS — E559 Vitamin D deficiency, unspecified: Secondary | ICD-10-CM | POA: Diagnosis not present

## 2020-08-28 DIAGNOSIS — E538 Deficiency of other specified B group vitamins: Secondary | ICD-10-CM | POA: Diagnosis not present

## 2020-08-28 DIAGNOSIS — Z992 Dependence on renal dialysis: Secondary | ICD-10-CM | POA: Diagnosis not present

## 2020-08-28 DIAGNOSIS — D509 Iron deficiency anemia, unspecified: Secondary | ICD-10-CM | POA: Diagnosis not present

## 2020-08-28 DIAGNOSIS — D631 Anemia in chronic kidney disease: Secondary | ICD-10-CM | POA: Diagnosis not present

## 2020-08-28 DIAGNOSIS — E559 Vitamin D deficiency, unspecified: Secondary | ICD-10-CM | POA: Diagnosis not present

## 2020-08-28 DIAGNOSIS — N186 End stage renal disease: Secondary | ICD-10-CM | POA: Diagnosis not present

## 2020-08-29 DIAGNOSIS — E559 Vitamin D deficiency, unspecified: Secondary | ICD-10-CM | POA: Diagnosis not present

## 2020-08-29 DIAGNOSIS — D509 Iron deficiency anemia, unspecified: Secondary | ICD-10-CM | POA: Diagnosis not present

## 2020-08-29 DIAGNOSIS — D631 Anemia in chronic kidney disease: Secondary | ICD-10-CM | POA: Diagnosis not present

## 2020-08-29 DIAGNOSIS — E538 Deficiency of other specified B group vitamins: Secondary | ICD-10-CM | POA: Diagnosis not present

## 2020-08-29 DIAGNOSIS — Z992 Dependence on renal dialysis: Secondary | ICD-10-CM | POA: Diagnosis not present

## 2020-08-29 DIAGNOSIS — N186 End stage renal disease: Secondary | ICD-10-CM | POA: Diagnosis not present

## 2020-08-30 DIAGNOSIS — D631 Anemia in chronic kidney disease: Secondary | ICD-10-CM | POA: Diagnosis not present

## 2020-08-30 DIAGNOSIS — N186 End stage renal disease: Secondary | ICD-10-CM | POA: Diagnosis not present

## 2020-08-30 DIAGNOSIS — E559 Vitamin D deficiency, unspecified: Secondary | ICD-10-CM | POA: Diagnosis not present

## 2020-08-30 DIAGNOSIS — E538 Deficiency of other specified B group vitamins: Secondary | ICD-10-CM | POA: Diagnosis not present

## 2020-08-30 DIAGNOSIS — Z992 Dependence on renal dialysis: Secondary | ICD-10-CM | POA: Diagnosis not present

## 2020-08-30 DIAGNOSIS — D509 Iron deficiency anemia, unspecified: Secondary | ICD-10-CM | POA: Diagnosis not present

## 2020-08-31 DIAGNOSIS — M79674 Pain in right toe(s): Secondary | ICD-10-CM | POA: Diagnosis not present

## 2020-08-31 DIAGNOSIS — E538 Deficiency of other specified B group vitamins: Secondary | ICD-10-CM | POA: Diagnosis not present

## 2020-08-31 DIAGNOSIS — B351 Tinea unguium: Secondary | ICD-10-CM | POA: Diagnosis not present

## 2020-08-31 DIAGNOSIS — D631 Anemia in chronic kidney disease: Secondary | ICD-10-CM | POA: Diagnosis not present

## 2020-08-31 DIAGNOSIS — L600 Ingrowing nail: Secondary | ICD-10-CM | POA: Diagnosis not present

## 2020-08-31 DIAGNOSIS — D509 Iron deficiency anemia, unspecified: Secondary | ICD-10-CM | POA: Diagnosis not present

## 2020-08-31 DIAGNOSIS — N186 End stage renal disease: Secondary | ICD-10-CM | POA: Diagnosis not present

## 2020-08-31 DIAGNOSIS — E119 Type 2 diabetes mellitus without complications: Secondary | ICD-10-CM | POA: Diagnosis not present

## 2020-08-31 DIAGNOSIS — M79675 Pain in left toe(s): Secondary | ICD-10-CM | POA: Diagnosis not present

## 2020-08-31 DIAGNOSIS — E559 Vitamin D deficiency, unspecified: Secondary | ICD-10-CM | POA: Diagnosis not present

## 2020-08-31 DIAGNOSIS — Z992 Dependence on renal dialysis: Secondary | ICD-10-CM | POA: Diagnosis not present

## 2020-09-01 DIAGNOSIS — N186 End stage renal disease: Secondary | ICD-10-CM | POA: Diagnosis not present

## 2020-09-01 DIAGNOSIS — D509 Iron deficiency anemia, unspecified: Secondary | ICD-10-CM | POA: Diagnosis not present

## 2020-09-01 DIAGNOSIS — E559 Vitamin D deficiency, unspecified: Secondary | ICD-10-CM | POA: Diagnosis not present

## 2020-09-01 DIAGNOSIS — D631 Anemia in chronic kidney disease: Secondary | ICD-10-CM | POA: Diagnosis not present

## 2020-09-01 DIAGNOSIS — E538 Deficiency of other specified B group vitamins: Secondary | ICD-10-CM | POA: Diagnosis not present

## 2020-09-01 DIAGNOSIS — Z992 Dependence on renal dialysis: Secondary | ICD-10-CM | POA: Diagnosis not present

## 2020-09-02 DIAGNOSIS — N186 End stage renal disease: Secondary | ICD-10-CM | POA: Diagnosis not present

## 2020-09-02 DIAGNOSIS — E559 Vitamin D deficiency, unspecified: Secondary | ICD-10-CM | POA: Diagnosis not present

## 2020-09-02 DIAGNOSIS — D631 Anemia in chronic kidney disease: Secondary | ICD-10-CM | POA: Diagnosis not present

## 2020-09-02 DIAGNOSIS — D509 Iron deficiency anemia, unspecified: Secondary | ICD-10-CM | POA: Diagnosis not present

## 2020-09-02 DIAGNOSIS — Z992 Dependence on renal dialysis: Secondary | ICD-10-CM | POA: Diagnosis not present

## 2020-09-02 DIAGNOSIS — E538 Deficiency of other specified B group vitamins: Secondary | ICD-10-CM | POA: Diagnosis not present

## 2020-09-03 DIAGNOSIS — Z992 Dependence on renal dialysis: Secondary | ICD-10-CM | POA: Diagnosis not present

## 2020-09-03 DIAGNOSIS — D509 Iron deficiency anemia, unspecified: Secondary | ICD-10-CM | POA: Diagnosis not present

## 2020-09-03 DIAGNOSIS — D631 Anemia in chronic kidney disease: Secondary | ICD-10-CM | POA: Diagnosis not present

## 2020-09-03 DIAGNOSIS — E559 Vitamin D deficiency, unspecified: Secondary | ICD-10-CM | POA: Diagnosis not present

## 2020-09-03 DIAGNOSIS — N186 End stage renal disease: Secondary | ICD-10-CM | POA: Diagnosis not present

## 2020-09-03 DIAGNOSIS — E538 Deficiency of other specified B group vitamins: Secondary | ICD-10-CM | POA: Diagnosis not present

## 2020-09-04 DIAGNOSIS — E559 Vitamin D deficiency, unspecified: Secondary | ICD-10-CM | POA: Diagnosis not present

## 2020-09-04 DIAGNOSIS — D509 Iron deficiency anemia, unspecified: Secondary | ICD-10-CM | POA: Diagnosis not present

## 2020-09-04 DIAGNOSIS — Z992 Dependence on renal dialysis: Secondary | ICD-10-CM | POA: Diagnosis not present

## 2020-09-04 DIAGNOSIS — D631 Anemia in chronic kidney disease: Secondary | ICD-10-CM | POA: Diagnosis not present

## 2020-09-04 DIAGNOSIS — N186 End stage renal disease: Secondary | ICD-10-CM | POA: Diagnosis not present

## 2020-09-04 DIAGNOSIS — E538 Deficiency of other specified B group vitamins: Secondary | ICD-10-CM | POA: Diagnosis not present

## 2020-09-05 DIAGNOSIS — Z992 Dependence on renal dialysis: Secondary | ICD-10-CM | POA: Diagnosis not present

## 2020-09-05 DIAGNOSIS — N186 End stage renal disease: Secondary | ICD-10-CM | POA: Diagnosis not present

## 2020-09-05 DIAGNOSIS — D509 Iron deficiency anemia, unspecified: Secondary | ICD-10-CM | POA: Diagnosis not present

## 2020-09-05 DIAGNOSIS — E538 Deficiency of other specified B group vitamins: Secondary | ICD-10-CM | POA: Diagnosis not present

## 2020-09-05 DIAGNOSIS — E559 Vitamin D deficiency, unspecified: Secondary | ICD-10-CM | POA: Diagnosis not present

## 2020-09-05 DIAGNOSIS — D631 Anemia in chronic kidney disease: Secondary | ICD-10-CM | POA: Diagnosis not present

## 2020-09-06 DIAGNOSIS — Z992 Dependence on renal dialysis: Secondary | ICD-10-CM | POA: Diagnosis not present

## 2020-09-06 DIAGNOSIS — D509 Iron deficiency anemia, unspecified: Secondary | ICD-10-CM | POA: Diagnosis not present

## 2020-09-06 DIAGNOSIS — N186 End stage renal disease: Secondary | ICD-10-CM | POA: Diagnosis not present

## 2020-09-06 DIAGNOSIS — E559 Vitamin D deficiency, unspecified: Secondary | ICD-10-CM | POA: Diagnosis not present

## 2020-09-06 DIAGNOSIS — D631 Anemia in chronic kidney disease: Secondary | ICD-10-CM | POA: Diagnosis not present

## 2020-09-06 DIAGNOSIS — E538 Deficiency of other specified B group vitamins: Secondary | ICD-10-CM | POA: Diagnosis not present

## 2020-09-07 DIAGNOSIS — N186 End stage renal disease: Secondary | ICD-10-CM | POA: Diagnosis not present

## 2020-09-07 DIAGNOSIS — E559 Vitamin D deficiency, unspecified: Secondary | ICD-10-CM | POA: Diagnosis not present

## 2020-09-07 DIAGNOSIS — D631 Anemia in chronic kidney disease: Secondary | ICD-10-CM | POA: Diagnosis not present

## 2020-09-07 DIAGNOSIS — E538 Deficiency of other specified B group vitamins: Secondary | ICD-10-CM | POA: Diagnosis not present

## 2020-09-07 DIAGNOSIS — Z992 Dependence on renal dialysis: Secondary | ICD-10-CM | POA: Diagnosis not present

## 2020-09-07 DIAGNOSIS — D509 Iron deficiency anemia, unspecified: Secondary | ICD-10-CM | POA: Diagnosis not present

## 2020-09-08 ENCOUNTER — Telehealth: Payer: Self-pay

## 2020-09-08 DIAGNOSIS — E559 Vitamin D deficiency, unspecified: Secondary | ICD-10-CM | POA: Diagnosis not present

## 2020-09-08 DIAGNOSIS — Z992 Dependence on renal dialysis: Secondary | ICD-10-CM | POA: Diagnosis not present

## 2020-09-08 DIAGNOSIS — N186 End stage renal disease: Secondary | ICD-10-CM | POA: Diagnosis not present

## 2020-09-08 DIAGNOSIS — D631 Anemia in chronic kidney disease: Secondary | ICD-10-CM | POA: Diagnosis not present

## 2020-09-08 DIAGNOSIS — D509 Iron deficiency anemia, unspecified: Secondary | ICD-10-CM | POA: Diagnosis not present

## 2020-09-08 DIAGNOSIS — E538 Deficiency of other specified B group vitamins: Secondary | ICD-10-CM | POA: Diagnosis not present

## 2020-09-08 NOTE — Chronic Care Management (AMB) (Signed)
Chronic Care Management Pharmacy Assistant   Name: Ray Lamb  MRN: [id number] DOB: [birth year]  Ray Lamb is an 87 y.o. year old male who presents for his follow-up CCM visit with the clinical pharmacist.  Reason for Encounter: Disease State Diabetes  Recent office visits:  08/02/20 Park Liter DO(PCP)- Patient was seen for a skin lesion of right leg. PCP will try to get patient in to see Dermatology. Patient reported he's no longer taking Gabapentin 100 mg. Follow up in 2 months.  06/25/20 Park Liter DO(PCP)- Patient was seen for hypertension. Labs were ordered and patient was started on Gabapentin 100 mg daily at bedtime and Levocetirizine 5 mg oral every evening. Patient stopped Loratadine 10 mg. Follow up one month.  Recent consult visits:  None Noted  Hospital visits:  None in previous 6 months  Medications: Outpatient Encounter Medications as of 09/08/2020  Medication Sig Note   ASPIRIN 81 PO Take by mouth every other day.     atorvastatin (LIPITOR) 80 MG tablet Take 1 tablet (80 mg total) by mouth at bedtime.    carvedilol (COREG) 12.5 MG tablet Take by mouth 2 (two) times daily.     clopidogrel (PLAVIX) 75 MG tablet Take 1 tablet (75 mg total) by mouth daily.    ezetimibe (ZETIA) 10 MG tablet Take 1 tablet (10 mg total) by mouth daily.    isosorbide mononitrate (IMDUR) 30 MG 24 hr tablet Take 1.5 tablets (45 mg total) by mouth daily.    Lancets (ONETOUCH ULTRASOFT) lancets USE TO CHECK BLOOD SUGAR TWICE A DAY    levocetirizine (XYZAL) 5 MG tablet Take 1 tablet (5 mg total) by mouth every evening. (Patient not taking: Reported on 08/02/2020)    Melatonin 10 MG TABS Take 10 mg by mouth at bedtime.    mirtazapine (REMERON) 7.5 MG tablet Take 7.5 mg by mouth as needed.  11/23/2019: Takes rarely for appetite stimualtion   Multiple Vitamins-Minerals (CENTRUM SILVER ULTRA MENS PO) Take by mouth daily.    nystatin (MYCOSTATIN/NYSTOP) powder Apply 1 application topically 3  (three) times daily.    ondansetron (ZOFRAN) 4 MG tablet Take 4 mg by mouth every 8 (eight) hours as needed.    sitaGLIPtin (JANUVIA) 25 MG tablet Take 1 tablet (25 mg total) by mouth daily.    timolol (TIMOPTIC) 0.5 % ophthalmic solution Place 1 drop into both eyes daily.     torsemide (DEMADEX) 20 MG tablet Take 40 mg by mouth daily. 06/25/2020: Take 40 by mouth BID    TURMERIC PO Take 1,300 mg by mouth daily.     Facility-Administered Encounter Medications as of 09/08/2020  Medication   sodium chloride flush (NS) 0.9 % injection 3 mL    Recent Relevant Labs: Lab Results  Component Value Date/Time   HGBA1C 6.8 06/25/2020 11:41 AM   HGBA1C 7.8 02/23/2020 12:00 AM   HGBA1C 6.1 11/20/2019 12:00 AM   MICROALBUR 150 (H) 07/03/2019 11:42 AM   MICROALBUR 150 (H) 01/02/2019 11:26 AM    Kidney Function Lab Results  Component Value Date/Time   CREATININE 8.68 (H) 06/25/2020 11:45 AM   CREATININE 6.34 (H) 12/12/2019 03:11 PM   GFRNONAA 7 (L) 12/12/2019 03:11 PM   GFRAA 8 (L) 12/12/2019 03:11 PM    Current antihyperglycemic regimen:  Januvia 25 mg last filled 06/25/20 90 DS  What recent interventions/DTPs have been made to improve glycemic control:  Patient stated he's taking an appetite pill to help him cut down on  sugars and he's doing just fine. Patient stated that he has no backlash from his Januvia.   Have there been any recent hospitalizations or ED visits since last visit with CPP? No  Patient denies hypoglycemic symptoms, including None  Patient denies hyperglycemic symptoms, including none  How often are you checking your blood sugar?    Patient stated he checks it maybe 1-2 times a week if that.   What are your blood sugars ranging?  Fasting: Below 100 Before meals:  After meals:  Bedtime:   During the week, how often does your blood glucose drop below 70? Never  Are you checking your feet daily/regularly?  Yes, Patient stated he Just went to podiatrist last week  and had foot exam and his toe nails trimmed and everything looked fine.  **Patient stated he had visit with PCP 09/08/20 and his Glucose was 95 during visit.  Adherence Review: Is the patient currently on a STATIN medication? Yes Is the patient currently on ACE/ARB medication? Yes Does the patient have >5 day gap between last estimated fill dates? No   [facility] Pharmacist Assistant [phone]   Star Rating Drugs: Januvia 25 mg last filled 06/25/20 90 DS atorvastatin 80 MG last filled 05/24/20 90 DS

## 2020-09-09 DIAGNOSIS — D631 Anemia in chronic kidney disease: Secondary | ICD-10-CM | POA: Diagnosis not present

## 2020-09-09 DIAGNOSIS — E559 Vitamin D deficiency, unspecified: Secondary | ICD-10-CM | POA: Diagnosis not present

## 2020-09-09 DIAGNOSIS — E538 Deficiency of other specified B group vitamins: Secondary | ICD-10-CM | POA: Diagnosis not present

## 2020-09-09 DIAGNOSIS — N186 End stage renal disease: Secondary | ICD-10-CM | POA: Diagnosis not present

## 2020-09-09 DIAGNOSIS — Z992 Dependence on renal dialysis: Secondary | ICD-10-CM | POA: Diagnosis not present

## 2020-09-09 DIAGNOSIS — D509 Iron deficiency anemia, unspecified: Secondary | ICD-10-CM | POA: Diagnosis not present

## 2020-09-10 DIAGNOSIS — D631 Anemia in chronic kidney disease: Secondary | ICD-10-CM | POA: Diagnosis not present

## 2020-09-10 DIAGNOSIS — E538 Deficiency of other specified B group vitamins: Secondary | ICD-10-CM | POA: Diagnosis not present

## 2020-09-10 DIAGNOSIS — E559 Vitamin D deficiency, unspecified: Secondary | ICD-10-CM | POA: Diagnosis not present

## 2020-09-10 DIAGNOSIS — Z992 Dependence on renal dialysis: Secondary | ICD-10-CM | POA: Diagnosis not present

## 2020-09-10 DIAGNOSIS — D509 Iron deficiency anemia, unspecified: Secondary | ICD-10-CM | POA: Diagnosis not present

## 2020-09-10 DIAGNOSIS — N186 End stage renal disease: Secondary | ICD-10-CM | POA: Diagnosis not present

## 2020-09-11 DIAGNOSIS — E538 Deficiency of other specified B group vitamins: Secondary | ICD-10-CM | POA: Diagnosis not present

## 2020-09-11 DIAGNOSIS — N186 End stage renal disease: Secondary | ICD-10-CM | POA: Diagnosis not present

## 2020-09-11 DIAGNOSIS — D509 Iron deficiency anemia, unspecified: Secondary | ICD-10-CM | POA: Diagnosis not present

## 2020-09-11 DIAGNOSIS — D631 Anemia in chronic kidney disease: Secondary | ICD-10-CM | POA: Diagnosis not present

## 2020-09-11 DIAGNOSIS — E559 Vitamin D deficiency, unspecified: Secondary | ICD-10-CM | POA: Diagnosis not present

## 2020-09-11 DIAGNOSIS — Z992 Dependence on renal dialysis: Secondary | ICD-10-CM | POA: Diagnosis not present

## 2020-09-12 DIAGNOSIS — E538 Deficiency of other specified B group vitamins: Secondary | ICD-10-CM | POA: Diagnosis not present

## 2020-09-12 DIAGNOSIS — D509 Iron deficiency anemia, unspecified: Secondary | ICD-10-CM | POA: Diagnosis not present

## 2020-09-12 DIAGNOSIS — E559 Vitamin D deficiency, unspecified: Secondary | ICD-10-CM | POA: Diagnosis not present

## 2020-09-12 DIAGNOSIS — N186 End stage renal disease: Secondary | ICD-10-CM | POA: Diagnosis not present

## 2020-09-12 DIAGNOSIS — D631 Anemia in chronic kidney disease: Secondary | ICD-10-CM | POA: Diagnosis not present

## 2020-09-12 DIAGNOSIS — Z992 Dependence on renal dialysis: Secondary | ICD-10-CM | POA: Diagnosis not present

## 2020-09-13 DIAGNOSIS — D631 Anemia in chronic kidney disease: Secondary | ICD-10-CM | POA: Diagnosis not present

## 2020-09-13 DIAGNOSIS — D509 Iron deficiency anemia, unspecified: Secondary | ICD-10-CM | POA: Diagnosis not present

## 2020-09-13 DIAGNOSIS — N2581 Secondary hyperparathyroidism of renal origin: Secondary | ICD-10-CM | POA: Diagnosis not present

## 2020-09-13 DIAGNOSIS — N186 End stage renal disease: Secondary | ICD-10-CM | POA: Diagnosis not present

## 2020-09-13 DIAGNOSIS — Z992 Dependence on renal dialysis: Secondary | ICD-10-CM | POA: Diagnosis not present

## 2020-09-13 DIAGNOSIS — H40053 Ocular hypertension, bilateral: Secondary | ICD-10-CM | POA: Diagnosis not present

## 2020-09-14 DIAGNOSIS — Z992 Dependence on renal dialysis: Secondary | ICD-10-CM | POA: Diagnosis not present

## 2020-09-14 DIAGNOSIS — I340 Nonrheumatic mitral (valve) insufficiency: Secondary | ICD-10-CM | POA: Diagnosis not present

## 2020-09-14 DIAGNOSIS — D631 Anemia in chronic kidney disease: Secondary | ICD-10-CM | POA: Diagnosis not present

## 2020-09-14 DIAGNOSIS — D509 Iron deficiency anemia, unspecified: Secondary | ICD-10-CM | POA: Diagnosis not present

## 2020-09-14 DIAGNOSIS — I10 Essential (primary) hypertension: Secondary | ICD-10-CM | POA: Diagnosis not present

## 2020-09-14 DIAGNOSIS — E785 Hyperlipidemia, unspecified: Secondary | ICD-10-CM | POA: Diagnosis not present

## 2020-09-14 DIAGNOSIS — N2581 Secondary hyperparathyroidism of renal origin: Secondary | ICD-10-CM | POA: Diagnosis not present

## 2020-09-14 DIAGNOSIS — I25810 Atherosclerosis of coronary artery bypass graft(s) without angina pectoris: Secondary | ICD-10-CM | POA: Diagnosis not present

## 2020-09-14 DIAGNOSIS — I6389 Other cerebral infarction: Secondary | ICD-10-CM | POA: Diagnosis not present

## 2020-09-14 DIAGNOSIS — N186 End stage renal disease: Secondary | ICD-10-CM | POA: Diagnosis not present

## 2020-09-14 DIAGNOSIS — I2510 Atherosclerotic heart disease of native coronary artery without angina pectoris: Secondary | ICD-10-CM | POA: Diagnosis not present

## 2020-09-15 DIAGNOSIS — N186 End stage renal disease: Secondary | ICD-10-CM | POA: Diagnosis not present

## 2020-09-15 DIAGNOSIS — Z992 Dependence on renal dialysis: Secondary | ICD-10-CM | POA: Diagnosis not present

## 2020-09-15 DIAGNOSIS — N2581 Secondary hyperparathyroidism of renal origin: Secondary | ICD-10-CM | POA: Diagnosis not present

## 2020-09-15 DIAGNOSIS — D509 Iron deficiency anemia, unspecified: Secondary | ICD-10-CM | POA: Diagnosis not present

## 2020-09-15 DIAGNOSIS — D631 Anemia in chronic kidney disease: Secondary | ICD-10-CM | POA: Diagnosis not present

## 2020-09-16 DIAGNOSIS — D631 Anemia in chronic kidney disease: Secondary | ICD-10-CM | POA: Diagnosis not present

## 2020-09-16 DIAGNOSIS — D509 Iron deficiency anemia, unspecified: Secondary | ICD-10-CM | POA: Diagnosis not present

## 2020-09-16 DIAGNOSIS — N2581 Secondary hyperparathyroidism of renal origin: Secondary | ICD-10-CM | POA: Diagnosis not present

## 2020-09-16 DIAGNOSIS — N186 End stage renal disease: Secondary | ICD-10-CM | POA: Diagnosis not present

## 2020-09-16 DIAGNOSIS — Z992 Dependence on renal dialysis: Secondary | ICD-10-CM | POA: Diagnosis not present

## 2020-09-17 DIAGNOSIS — D509 Iron deficiency anemia, unspecified: Secondary | ICD-10-CM | POA: Diagnosis not present

## 2020-09-17 DIAGNOSIS — N186 End stage renal disease: Secondary | ICD-10-CM | POA: Diagnosis not present

## 2020-09-17 DIAGNOSIS — N2581 Secondary hyperparathyroidism of renal origin: Secondary | ICD-10-CM | POA: Diagnosis not present

## 2020-09-17 DIAGNOSIS — Z992 Dependence on renal dialysis: Secondary | ICD-10-CM | POA: Diagnosis not present

## 2020-09-17 DIAGNOSIS — D631 Anemia in chronic kidney disease: Secondary | ICD-10-CM | POA: Diagnosis not present

## 2020-09-18 DIAGNOSIS — D631 Anemia in chronic kidney disease: Secondary | ICD-10-CM | POA: Diagnosis not present

## 2020-09-18 DIAGNOSIS — N2581 Secondary hyperparathyroidism of renal origin: Secondary | ICD-10-CM | POA: Diagnosis not present

## 2020-09-18 DIAGNOSIS — N186 End stage renal disease: Secondary | ICD-10-CM | POA: Diagnosis not present

## 2020-09-18 DIAGNOSIS — Z992 Dependence on renal dialysis: Secondary | ICD-10-CM | POA: Diagnosis not present

## 2020-09-18 DIAGNOSIS — D509 Iron deficiency anemia, unspecified: Secondary | ICD-10-CM | POA: Diagnosis not present

## 2020-09-19 DIAGNOSIS — N2581 Secondary hyperparathyroidism of renal origin: Secondary | ICD-10-CM | POA: Diagnosis not present

## 2020-09-19 DIAGNOSIS — D631 Anemia in chronic kidney disease: Secondary | ICD-10-CM | POA: Diagnosis not present

## 2020-09-19 DIAGNOSIS — Z992 Dependence on renal dialysis: Secondary | ICD-10-CM | POA: Diagnosis not present

## 2020-09-19 DIAGNOSIS — D509 Iron deficiency anemia, unspecified: Secondary | ICD-10-CM | POA: Diagnosis not present

## 2020-09-19 DIAGNOSIS — N186 End stage renal disease: Secondary | ICD-10-CM | POA: Diagnosis not present

## 2020-09-20 DIAGNOSIS — N186 End stage renal disease: Secondary | ICD-10-CM | POA: Diagnosis not present

## 2020-09-20 DIAGNOSIS — Z992 Dependence on renal dialysis: Secondary | ICD-10-CM | POA: Diagnosis not present

## 2020-09-20 DIAGNOSIS — D509 Iron deficiency anemia, unspecified: Secondary | ICD-10-CM | POA: Diagnosis not present

## 2020-09-20 DIAGNOSIS — D631 Anemia in chronic kidney disease: Secondary | ICD-10-CM | POA: Diagnosis not present

## 2020-09-20 DIAGNOSIS — N2581 Secondary hyperparathyroidism of renal origin: Secondary | ICD-10-CM | POA: Diagnosis not present

## 2020-09-21 DIAGNOSIS — Z992 Dependence on renal dialysis: Secondary | ICD-10-CM | POA: Diagnosis not present

## 2020-09-21 DIAGNOSIS — N186 End stage renal disease: Secondary | ICD-10-CM | POA: Diagnosis not present

## 2020-09-21 DIAGNOSIS — D631 Anemia in chronic kidney disease: Secondary | ICD-10-CM | POA: Diagnosis not present

## 2020-09-21 DIAGNOSIS — D509 Iron deficiency anemia, unspecified: Secondary | ICD-10-CM | POA: Diagnosis not present

## 2020-09-21 DIAGNOSIS — N2581 Secondary hyperparathyroidism of renal origin: Secondary | ICD-10-CM | POA: Diagnosis not present

## 2020-09-22 DIAGNOSIS — Z992 Dependence on renal dialysis: Secondary | ICD-10-CM | POA: Diagnosis not present

## 2020-09-22 DIAGNOSIS — D509 Iron deficiency anemia, unspecified: Secondary | ICD-10-CM | POA: Diagnosis not present

## 2020-09-22 DIAGNOSIS — N186 End stage renal disease: Secondary | ICD-10-CM | POA: Diagnosis not present

## 2020-09-22 DIAGNOSIS — N2581 Secondary hyperparathyroidism of renal origin: Secondary | ICD-10-CM | POA: Diagnosis not present

## 2020-09-22 DIAGNOSIS — D631 Anemia in chronic kidney disease: Secondary | ICD-10-CM | POA: Diagnosis not present

## 2020-09-23 DIAGNOSIS — D509 Iron deficiency anemia, unspecified: Secondary | ICD-10-CM | POA: Diagnosis not present

## 2020-09-23 DIAGNOSIS — N2581 Secondary hyperparathyroidism of renal origin: Secondary | ICD-10-CM | POA: Diagnosis not present

## 2020-09-23 DIAGNOSIS — D631 Anemia in chronic kidney disease: Secondary | ICD-10-CM | POA: Diagnosis not present

## 2020-09-23 DIAGNOSIS — N186 End stage renal disease: Secondary | ICD-10-CM | POA: Diagnosis not present

## 2020-09-23 DIAGNOSIS — Z992 Dependence on renal dialysis: Secondary | ICD-10-CM | POA: Diagnosis not present

## 2020-09-24 DIAGNOSIS — Z992 Dependence on renal dialysis: Secondary | ICD-10-CM | POA: Diagnosis not present

## 2020-09-24 DIAGNOSIS — D509 Iron deficiency anemia, unspecified: Secondary | ICD-10-CM | POA: Diagnosis not present

## 2020-09-24 DIAGNOSIS — N2581 Secondary hyperparathyroidism of renal origin: Secondary | ICD-10-CM | POA: Diagnosis not present

## 2020-09-24 DIAGNOSIS — N186 End stage renal disease: Secondary | ICD-10-CM | POA: Diagnosis not present

## 2020-09-24 DIAGNOSIS — D631 Anemia in chronic kidney disease: Secondary | ICD-10-CM | POA: Diagnosis not present

## 2020-09-25 DIAGNOSIS — Z992 Dependence on renal dialysis: Secondary | ICD-10-CM | POA: Diagnosis not present

## 2020-09-25 DIAGNOSIS — N186 End stage renal disease: Secondary | ICD-10-CM | POA: Diagnosis not present

## 2020-09-25 DIAGNOSIS — N2581 Secondary hyperparathyroidism of renal origin: Secondary | ICD-10-CM | POA: Diagnosis not present

## 2020-09-25 DIAGNOSIS — D631 Anemia in chronic kidney disease: Secondary | ICD-10-CM | POA: Diagnosis not present

## 2020-09-25 DIAGNOSIS — D509 Iron deficiency anemia, unspecified: Secondary | ICD-10-CM | POA: Diagnosis not present

## 2020-09-26 DIAGNOSIS — Z992 Dependence on renal dialysis: Secondary | ICD-10-CM | POA: Diagnosis not present

## 2020-09-26 DIAGNOSIS — N186 End stage renal disease: Secondary | ICD-10-CM | POA: Diagnosis not present

## 2020-09-26 DIAGNOSIS — N2581 Secondary hyperparathyroidism of renal origin: Secondary | ICD-10-CM | POA: Diagnosis not present

## 2020-09-26 DIAGNOSIS — D631 Anemia in chronic kidney disease: Secondary | ICD-10-CM | POA: Diagnosis not present

## 2020-09-26 DIAGNOSIS — D509 Iron deficiency anemia, unspecified: Secondary | ICD-10-CM | POA: Diagnosis not present

## 2020-09-27 DIAGNOSIS — D509 Iron deficiency anemia, unspecified: Secondary | ICD-10-CM | POA: Diagnosis not present

## 2020-09-27 DIAGNOSIS — N186 End stage renal disease: Secondary | ICD-10-CM | POA: Diagnosis not present

## 2020-09-27 DIAGNOSIS — Z992 Dependence on renal dialysis: Secondary | ICD-10-CM | POA: Diagnosis not present

## 2020-09-27 DIAGNOSIS — N2581 Secondary hyperparathyroidism of renal origin: Secondary | ICD-10-CM | POA: Diagnosis not present

## 2020-09-27 DIAGNOSIS — D631 Anemia in chronic kidney disease: Secondary | ICD-10-CM | POA: Diagnosis not present

## 2020-09-28 DIAGNOSIS — Z992 Dependence on renal dialysis: Secondary | ICD-10-CM | POA: Diagnosis not present

## 2020-09-28 DIAGNOSIS — N186 End stage renal disease: Secondary | ICD-10-CM | POA: Diagnosis not present

## 2020-09-28 DIAGNOSIS — D631 Anemia in chronic kidney disease: Secondary | ICD-10-CM | POA: Diagnosis not present

## 2020-09-28 DIAGNOSIS — D509 Iron deficiency anemia, unspecified: Secondary | ICD-10-CM | POA: Diagnosis not present

## 2020-09-28 DIAGNOSIS — N2581 Secondary hyperparathyroidism of renal origin: Secondary | ICD-10-CM | POA: Diagnosis not present

## 2020-09-29 DIAGNOSIS — F0280 Dementia in other diseases classified elsewhere without behavioral disturbance: Secondary | ICD-10-CM | POA: Diagnosis not present

## 2020-09-29 DIAGNOSIS — G301 Alzheimer's disease with late onset: Secondary | ICD-10-CM | POA: Diagnosis not present

## 2020-09-29 DIAGNOSIS — D631 Anemia in chronic kidney disease: Secondary | ICD-10-CM | POA: Diagnosis not present

## 2020-09-29 DIAGNOSIS — N2581 Secondary hyperparathyroidism of renal origin: Secondary | ICD-10-CM | POA: Diagnosis not present

## 2020-09-29 DIAGNOSIS — N184 Chronic kidney disease, stage 4 (severe): Secondary | ICD-10-CM | POA: Diagnosis not present

## 2020-09-29 DIAGNOSIS — Z992 Dependence on renal dialysis: Secondary | ICD-10-CM | POA: Diagnosis not present

## 2020-09-29 DIAGNOSIS — R011 Cardiac murmur, unspecified: Secondary | ICD-10-CM | POA: Diagnosis not present

## 2020-09-29 DIAGNOSIS — N186 End stage renal disease: Secondary | ICD-10-CM | POA: Diagnosis not present

## 2020-09-29 DIAGNOSIS — D509 Iron deficiency anemia, unspecified: Secondary | ICD-10-CM | POA: Diagnosis not present

## 2020-09-29 DIAGNOSIS — Z8673 Personal history of transient ischemic attack (TIA), and cerebral infarction without residual deficits: Secondary | ICD-10-CM | POA: Diagnosis not present

## 2020-09-30 DIAGNOSIS — N186 End stage renal disease: Secondary | ICD-10-CM | POA: Diagnosis not present

## 2020-09-30 DIAGNOSIS — D509 Iron deficiency anemia, unspecified: Secondary | ICD-10-CM | POA: Diagnosis not present

## 2020-09-30 DIAGNOSIS — N2581 Secondary hyperparathyroidism of renal origin: Secondary | ICD-10-CM | POA: Diagnosis not present

## 2020-09-30 DIAGNOSIS — D631 Anemia in chronic kidney disease: Secondary | ICD-10-CM | POA: Diagnosis not present

## 2020-09-30 DIAGNOSIS — Z992 Dependence on renal dialysis: Secondary | ICD-10-CM | POA: Diagnosis not present

## 2020-10-01 DIAGNOSIS — N186 End stage renal disease: Secondary | ICD-10-CM | POA: Diagnosis not present

## 2020-10-01 DIAGNOSIS — D631 Anemia in chronic kidney disease: Secondary | ICD-10-CM | POA: Diagnosis not present

## 2020-10-01 DIAGNOSIS — D509 Iron deficiency anemia, unspecified: Secondary | ICD-10-CM | POA: Diagnosis not present

## 2020-10-01 DIAGNOSIS — N2581 Secondary hyperparathyroidism of renal origin: Secondary | ICD-10-CM | POA: Diagnosis not present

## 2020-10-01 DIAGNOSIS — Z992 Dependence on renal dialysis: Secondary | ICD-10-CM | POA: Diagnosis not present

## 2020-10-02 DIAGNOSIS — N186 End stage renal disease: Secondary | ICD-10-CM | POA: Diagnosis not present

## 2020-10-02 DIAGNOSIS — D509 Iron deficiency anemia, unspecified: Secondary | ICD-10-CM | POA: Diagnosis not present

## 2020-10-02 DIAGNOSIS — D631 Anemia in chronic kidney disease: Secondary | ICD-10-CM | POA: Diagnosis not present

## 2020-10-02 DIAGNOSIS — Z992 Dependence on renal dialysis: Secondary | ICD-10-CM | POA: Diagnosis not present

## 2020-10-02 DIAGNOSIS — N2581 Secondary hyperparathyroidism of renal origin: Secondary | ICD-10-CM | POA: Diagnosis not present

## 2020-10-03 DIAGNOSIS — Z992 Dependence on renal dialysis: Secondary | ICD-10-CM | POA: Diagnosis not present

## 2020-10-03 DIAGNOSIS — D631 Anemia in chronic kidney disease: Secondary | ICD-10-CM | POA: Diagnosis not present

## 2020-10-03 DIAGNOSIS — N2581 Secondary hyperparathyroidism of renal origin: Secondary | ICD-10-CM | POA: Diagnosis not present

## 2020-10-03 DIAGNOSIS — D509 Iron deficiency anemia, unspecified: Secondary | ICD-10-CM | POA: Diagnosis not present

## 2020-10-03 DIAGNOSIS — N186 End stage renal disease: Secondary | ICD-10-CM | POA: Diagnosis not present

## 2020-10-04 DIAGNOSIS — Z992 Dependence on renal dialysis: Secondary | ICD-10-CM | POA: Diagnosis not present

## 2020-10-04 DIAGNOSIS — N186 End stage renal disease: Secondary | ICD-10-CM | POA: Diagnosis not present

## 2020-10-04 DIAGNOSIS — D631 Anemia in chronic kidney disease: Secondary | ICD-10-CM | POA: Diagnosis not present

## 2020-10-04 DIAGNOSIS — N2581 Secondary hyperparathyroidism of renal origin: Secondary | ICD-10-CM | POA: Diagnosis not present

## 2020-10-04 DIAGNOSIS — D509 Iron deficiency anemia, unspecified: Secondary | ICD-10-CM | POA: Diagnosis not present

## 2020-10-05 ENCOUNTER — Ambulatory Visit: Payer: Medicare Other | Admitting: Family Medicine

## 2020-10-05 DIAGNOSIS — N2581 Secondary hyperparathyroidism of renal origin: Secondary | ICD-10-CM | POA: Diagnosis not present

## 2020-10-05 DIAGNOSIS — D509 Iron deficiency anemia, unspecified: Secondary | ICD-10-CM | POA: Diagnosis not present

## 2020-10-05 DIAGNOSIS — D631 Anemia in chronic kidney disease: Secondary | ICD-10-CM | POA: Diagnosis not present

## 2020-10-05 DIAGNOSIS — Z992 Dependence on renal dialysis: Secondary | ICD-10-CM | POA: Diagnosis not present

## 2020-10-05 DIAGNOSIS — N186 End stage renal disease: Secondary | ICD-10-CM | POA: Diagnosis not present

## 2020-10-06 DIAGNOSIS — D509 Iron deficiency anemia, unspecified: Secondary | ICD-10-CM | POA: Diagnosis not present

## 2020-10-06 DIAGNOSIS — N2581 Secondary hyperparathyroidism of renal origin: Secondary | ICD-10-CM | POA: Diagnosis not present

## 2020-10-06 DIAGNOSIS — D631 Anemia in chronic kidney disease: Secondary | ICD-10-CM | POA: Diagnosis not present

## 2020-10-06 DIAGNOSIS — Z992 Dependence on renal dialysis: Secondary | ICD-10-CM | POA: Diagnosis not present

## 2020-10-06 DIAGNOSIS — N186 End stage renal disease: Secondary | ICD-10-CM | POA: Diagnosis not present

## 2020-10-07 DIAGNOSIS — N2581 Secondary hyperparathyroidism of renal origin: Secondary | ICD-10-CM | POA: Diagnosis not present

## 2020-10-07 DIAGNOSIS — Z992 Dependence on renal dialysis: Secondary | ICD-10-CM | POA: Diagnosis not present

## 2020-10-07 DIAGNOSIS — N186 End stage renal disease: Secondary | ICD-10-CM | POA: Diagnosis not present

## 2020-10-07 DIAGNOSIS — D631 Anemia in chronic kidney disease: Secondary | ICD-10-CM | POA: Diagnosis not present

## 2020-10-07 DIAGNOSIS — D509 Iron deficiency anemia, unspecified: Secondary | ICD-10-CM | POA: Diagnosis not present

## 2020-10-08 DIAGNOSIS — N2581 Secondary hyperparathyroidism of renal origin: Secondary | ICD-10-CM | POA: Diagnosis not present

## 2020-10-08 DIAGNOSIS — D509 Iron deficiency anemia, unspecified: Secondary | ICD-10-CM | POA: Diagnosis not present

## 2020-10-08 DIAGNOSIS — Z992 Dependence on renal dialysis: Secondary | ICD-10-CM | POA: Diagnosis not present

## 2020-10-08 DIAGNOSIS — N186 End stage renal disease: Secondary | ICD-10-CM | POA: Diagnosis not present

## 2020-10-08 DIAGNOSIS — D631 Anemia in chronic kidney disease: Secondary | ICD-10-CM | POA: Diagnosis not present

## 2020-10-09 DIAGNOSIS — Z992 Dependence on renal dialysis: Secondary | ICD-10-CM | POA: Diagnosis not present

## 2020-10-09 DIAGNOSIS — D509 Iron deficiency anemia, unspecified: Secondary | ICD-10-CM | POA: Diagnosis not present

## 2020-10-09 DIAGNOSIS — D631 Anemia in chronic kidney disease: Secondary | ICD-10-CM | POA: Diagnosis not present

## 2020-10-09 DIAGNOSIS — N186 End stage renal disease: Secondary | ICD-10-CM | POA: Diagnosis not present

## 2020-10-09 DIAGNOSIS — N2581 Secondary hyperparathyroidism of renal origin: Secondary | ICD-10-CM | POA: Diagnosis not present

## 2020-10-10 DIAGNOSIS — D509 Iron deficiency anemia, unspecified: Secondary | ICD-10-CM | POA: Diagnosis not present

## 2020-10-10 DIAGNOSIS — D631 Anemia in chronic kidney disease: Secondary | ICD-10-CM | POA: Diagnosis not present

## 2020-10-10 DIAGNOSIS — N2581 Secondary hyperparathyroidism of renal origin: Secondary | ICD-10-CM | POA: Diagnosis not present

## 2020-10-10 DIAGNOSIS — N186 End stage renal disease: Secondary | ICD-10-CM | POA: Diagnosis not present

## 2020-10-10 DIAGNOSIS — Z992 Dependence on renal dialysis: Secondary | ICD-10-CM | POA: Diagnosis not present

## 2020-10-11 DIAGNOSIS — Z992 Dependence on renal dialysis: Secondary | ICD-10-CM | POA: Diagnosis not present

## 2020-10-11 DIAGNOSIS — D631 Anemia in chronic kidney disease: Secondary | ICD-10-CM | POA: Diagnosis not present

## 2020-10-11 DIAGNOSIS — D509 Iron deficiency anemia, unspecified: Secondary | ICD-10-CM | POA: Diagnosis not present

## 2020-10-11 DIAGNOSIS — I10 Essential (primary) hypertension: Secondary | ICD-10-CM | POA: Diagnosis not present

## 2020-10-11 DIAGNOSIS — N186 End stage renal disease: Secondary | ICD-10-CM | POA: Diagnosis not present

## 2020-10-11 DIAGNOSIS — N2581 Secondary hyperparathyroidism of renal origin: Secondary | ICD-10-CM | POA: Diagnosis not present

## 2020-10-12 DIAGNOSIS — D631 Anemia in chronic kidney disease: Secondary | ICD-10-CM | POA: Diagnosis not present

## 2020-10-12 DIAGNOSIS — N186 End stage renal disease: Secondary | ICD-10-CM | POA: Diagnosis not present

## 2020-10-12 DIAGNOSIS — D509 Iron deficiency anemia, unspecified: Secondary | ICD-10-CM | POA: Diagnosis not present

## 2020-10-12 DIAGNOSIS — Z992 Dependence on renal dialysis: Secondary | ICD-10-CM | POA: Diagnosis not present

## 2020-10-12 DIAGNOSIS — N2581 Secondary hyperparathyroidism of renal origin: Secondary | ICD-10-CM | POA: Diagnosis not present

## 2020-10-13 DIAGNOSIS — D631 Anemia in chronic kidney disease: Secondary | ICD-10-CM | POA: Diagnosis not present

## 2020-10-13 DIAGNOSIS — N2581 Secondary hyperparathyroidism of renal origin: Secondary | ICD-10-CM | POA: Diagnosis not present

## 2020-10-13 DIAGNOSIS — D509 Iron deficiency anemia, unspecified: Secondary | ICD-10-CM | POA: Diagnosis not present

## 2020-10-13 DIAGNOSIS — N186 End stage renal disease: Secondary | ICD-10-CM | POA: Diagnosis not present

## 2020-10-13 DIAGNOSIS — Z992 Dependence on renal dialysis: Secondary | ICD-10-CM | POA: Diagnosis not present

## 2020-10-14 DIAGNOSIS — D631 Anemia in chronic kidney disease: Secondary | ICD-10-CM | POA: Diagnosis not present

## 2020-10-14 DIAGNOSIS — Z992 Dependence on renal dialysis: Secondary | ICD-10-CM | POA: Diagnosis not present

## 2020-10-14 DIAGNOSIS — N186 End stage renal disease: Secondary | ICD-10-CM | POA: Diagnosis not present

## 2020-10-14 DIAGNOSIS — D509 Iron deficiency anemia, unspecified: Secondary | ICD-10-CM | POA: Diagnosis not present

## 2020-10-15 DIAGNOSIS — D509 Iron deficiency anemia, unspecified: Secondary | ICD-10-CM | POA: Diagnosis not present

## 2020-10-15 DIAGNOSIS — N186 End stage renal disease: Secondary | ICD-10-CM | POA: Diagnosis not present

## 2020-10-15 DIAGNOSIS — Z992 Dependence on renal dialysis: Secondary | ICD-10-CM | POA: Diagnosis not present

## 2020-10-15 DIAGNOSIS — D631 Anemia in chronic kidney disease: Secondary | ICD-10-CM | POA: Diagnosis not present

## 2020-10-16 DIAGNOSIS — D509 Iron deficiency anemia, unspecified: Secondary | ICD-10-CM | POA: Diagnosis not present

## 2020-10-16 DIAGNOSIS — N186 End stage renal disease: Secondary | ICD-10-CM | POA: Diagnosis not present

## 2020-10-16 DIAGNOSIS — D631 Anemia in chronic kidney disease: Secondary | ICD-10-CM | POA: Diagnosis not present

## 2020-10-16 DIAGNOSIS — Z992 Dependence on renal dialysis: Secondary | ICD-10-CM | POA: Diagnosis not present

## 2020-10-17 DIAGNOSIS — D509 Iron deficiency anemia, unspecified: Secondary | ICD-10-CM | POA: Diagnosis not present

## 2020-10-17 DIAGNOSIS — D631 Anemia in chronic kidney disease: Secondary | ICD-10-CM | POA: Diagnosis not present

## 2020-10-17 DIAGNOSIS — Z992 Dependence on renal dialysis: Secondary | ICD-10-CM | POA: Diagnosis not present

## 2020-10-17 DIAGNOSIS — N186 End stage renal disease: Secondary | ICD-10-CM | POA: Diagnosis not present

## 2020-10-18 DIAGNOSIS — D509 Iron deficiency anemia, unspecified: Secondary | ICD-10-CM | POA: Diagnosis not present

## 2020-10-18 DIAGNOSIS — Z992 Dependence on renal dialysis: Secondary | ICD-10-CM | POA: Diagnosis not present

## 2020-10-18 DIAGNOSIS — N186 End stage renal disease: Secondary | ICD-10-CM | POA: Diagnosis not present

## 2020-10-18 DIAGNOSIS — D631 Anemia in chronic kidney disease: Secondary | ICD-10-CM | POA: Diagnosis not present

## 2020-10-19 DIAGNOSIS — Z992 Dependence on renal dialysis: Secondary | ICD-10-CM | POA: Diagnosis not present

## 2020-10-19 DIAGNOSIS — D509 Iron deficiency anemia, unspecified: Secondary | ICD-10-CM | POA: Diagnosis not present

## 2020-10-19 DIAGNOSIS — K219 Gastro-esophageal reflux disease without esophagitis: Secondary | ICD-10-CM | POA: Diagnosis not present

## 2020-10-19 DIAGNOSIS — I2510 Atherosclerotic heart disease of native coronary artery without angina pectoris: Secondary | ICD-10-CM | POA: Diagnosis not present

## 2020-10-19 DIAGNOSIS — I25810 Atherosclerosis of coronary artery bypass graft(s) without angina pectoris: Secondary | ICD-10-CM | POA: Diagnosis not present

## 2020-10-19 DIAGNOSIS — I340 Nonrheumatic mitral (valve) insufficiency: Secondary | ICD-10-CM | POA: Diagnosis not present

## 2020-10-19 DIAGNOSIS — N186 End stage renal disease: Secondary | ICD-10-CM | POA: Diagnosis not present

## 2020-10-19 DIAGNOSIS — I10 Essential (primary) hypertension: Secondary | ICD-10-CM | POA: Diagnosis not present

## 2020-10-19 DIAGNOSIS — D631 Anemia in chronic kidney disease: Secondary | ICD-10-CM | POA: Diagnosis not present

## 2020-10-19 DIAGNOSIS — R0602 Shortness of breath: Secondary | ICD-10-CM | POA: Diagnosis not present

## 2020-10-19 DIAGNOSIS — E785 Hyperlipidemia, unspecified: Secondary | ICD-10-CM | POA: Diagnosis not present

## 2020-10-20 DIAGNOSIS — D631 Anemia in chronic kidney disease: Secondary | ICD-10-CM | POA: Diagnosis not present

## 2020-10-20 DIAGNOSIS — Z992 Dependence on renal dialysis: Secondary | ICD-10-CM | POA: Diagnosis not present

## 2020-10-20 DIAGNOSIS — N186 End stage renal disease: Secondary | ICD-10-CM | POA: Diagnosis not present

## 2020-10-20 DIAGNOSIS — D509 Iron deficiency anemia, unspecified: Secondary | ICD-10-CM | POA: Diagnosis not present

## 2020-10-21 DIAGNOSIS — D631 Anemia in chronic kidney disease: Secondary | ICD-10-CM | POA: Diagnosis not present

## 2020-10-21 DIAGNOSIS — N186 End stage renal disease: Secondary | ICD-10-CM | POA: Diagnosis not present

## 2020-10-21 DIAGNOSIS — Z992 Dependence on renal dialysis: Secondary | ICD-10-CM | POA: Diagnosis not present

## 2020-10-21 DIAGNOSIS — D509 Iron deficiency anemia, unspecified: Secondary | ICD-10-CM | POA: Diagnosis not present

## 2020-10-22 ENCOUNTER — Ambulatory Visit: Payer: Medicare Other | Admitting: Family Medicine

## 2020-10-22 DIAGNOSIS — Z992 Dependence on renal dialysis: Secondary | ICD-10-CM | POA: Diagnosis not present

## 2020-10-22 DIAGNOSIS — D631 Anemia in chronic kidney disease: Secondary | ICD-10-CM | POA: Diagnosis not present

## 2020-10-22 DIAGNOSIS — N186 End stage renal disease: Secondary | ICD-10-CM | POA: Diagnosis not present

## 2020-10-22 DIAGNOSIS — D509 Iron deficiency anemia, unspecified: Secondary | ICD-10-CM | POA: Diagnosis not present

## 2020-10-23 DIAGNOSIS — N186 End stage renal disease: Secondary | ICD-10-CM | POA: Diagnosis not present

## 2020-10-23 DIAGNOSIS — Z992 Dependence on renal dialysis: Secondary | ICD-10-CM | POA: Diagnosis not present

## 2020-10-23 DIAGNOSIS — D509 Iron deficiency anemia, unspecified: Secondary | ICD-10-CM | POA: Diagnosis not present

## 2020-10-23 DIAGNOSIS — D631 Anemia in chronic kidney disease: Secondary | ICD-10-CM | POA: Diagnosis not present

## 2020-10-24 DIAGNOSIS — N186 End stage renal disease: Secondary | ICD-10-CM | POA: Diagnosis not present

## 2020-10-24 DIAGNOSIS — D631 Anemia in chronic kidney disease: Secondary | ICD-10-CM | POA: Diagnosis not present

## 2020-10-24 DIAGNOSIS — Z992 Dependence on renal dialysis: Secondary | ICD-10-CM | POA: Diagnosis not present

## 2020-10-24 DIAGNOSIS — D509 Iron deficiency anemia, unspecified: Secondary | ICD-10-CM | POA: Diagnosis not present

## 2020-10-25 DIAGNOSIS — D631 Anemia in chronic kidney disease: Secondary | ICD-10-CM | POA: Diagnosis not present

## 2020-10-25 DIAGNOSIS — D509 Iron deficiency anemia, unspecified: Secondary | ICD-10-CM | POA: Diagnosis not present

## 2020-10-25 DIAGNOSIS — Z992 Dependence on renal dialysis: Secondary | ICD-10-CM | POA: Diagnosis not present

## 2020-10-25 DIAGNOSIS — N186 End stage renal disease: Secondary | ICD-10-CM | POA: Diagnosis not present

## 2020-10-26 ENCOUNTER — Ambulatory Visit (INDEPENDENT_AMBULATORY_CARE_PROVIDER_SITE_OTHER): Payer: Medicare Other | Admitting: Nurse Practitioner

## 2020-10-26 ENCOUNTER — Other Ambulatory Visit: Payer: Self-pay

## 2020-10-26 ENCOUNTER — Encounter: Payer: Self-pay | Admitting: Nurse Practitioner

## 2020-10-26 VITALS — BP 127/70 | HR 88 | Temp 97.7°F | Ht 63.0 in | Wt 132.8 lb

## 2020-10-26 DIAGNOSIS — I129 Hypertensive chronic kidney disease with stage 1 through stage 4 chronic kidney disease, or unspecified chronic kidney disease: Secondary | ICD-10-CM

## 2020-10-26 DIAGNOSIS — D631 Anemia in chronic kidney disease: Secondary | ICD-10-CM | POA: Diagnosis not present

## 2020-10-26 DIAGNOSIS — N186 End stage renal disease: Secondary | ICD-10-CM | POA: Diagnosis not present

## 2020-10-26 DIAGNOSIS — Z992 Dependence on renal dialysis: Secondary | ICD-10-CM | POA: Diagnosis not present

## 2020-10-26 DIAGNOSIS — E1122 Type 2 diabetes mellitus with diabetic chronic kidney disease: Secondary | ICD-10-CM

## 2020-10-26 DIAGNOSIS — I25709 Atherosclerosis of coronary artery bypass graft(s), unspecified, with unspecified angina pectoris: Secondary | ICD-10-CM

## 2020-10-26 DIAGNOSIS — D509 Iron deficiency anemia, unspecified: Secondary | ICD-10-CM | POA: Diagnosis not present

## 2020-10-26 DIAGNOSIS — N184 Chronic kidney disease, stage 4 (severe): Secondary | ICD-10-CM | POA: Diagnosis not present

## 2020-10-26 DIAGNOSIS — N185 Chronic kidney disease, stage 5: Secondary | ICD-10-CM

## 2020-10-26 DIAGNOSIS — Z23 Encounter for immunization: Secondary | ICD-10-CM

## 2020-10-26 LAB — BAYER DCA HB A1C WAIVED: HB A1C (BAYER DCA - WAIVED): 5.3 % (ref 4.8–5.6)

## 2020-10-26 NOTE — Assessment & Plan Note (Signed)
Chronic, stable. Continuing peritoneal dialysis nightly at home. Continue collaboration with nephrology. He will bring a copy of his labs to his next visit.

## 2020-10-26 NOTE — Assessment & Plan Note (Signed)
Chronic, stable. Continue current regimen. Labs checked monthly with nephrology at [facility]. He will bring in a copy of labs to next visit.

## 2020-10-26 NOTE — Progress Notes (Signed)
Established Patient Office Visit  Subjective:  Patient ID: Ray Lamb, male    DOB: December 04, 1930  Age: 87 y.o. MRN: [id number]  CC:  Chief Complaint  Patient presents with   Diabetes   Hyperlipidemia   Hypertension    HPI Ray Lamb presents for follow up on diabetes, hypertension, and hyperlipidemia   HYPERTENSION / [name] Satisfied with current treatment? yes Duration of hypertension: chronic BP monitoring frequency: daily BP range: 759F systolic BP medication side effects: no Past BP meds: coreg, imdur Duration of hyperlipidemia: chronic Cholesterol medication side effects: no Cholesterol supplements: none Past cholesterol medications: lipitor Medication compliance: excellent compliance Aspirin: yes Recent stressors: no Recurrent headaches: no Visual changes: no Palpitations: no Dyspnea: no Chest pain: no Lower extremity edema: no Dizzy/lightheaded: no  DIABETES Hypoglycemic episodes:no Polydipsia/polyuria: no Visual disturbance: no Chest pain: no Paresthesias: no Glucose Monitoring: yes  Accucheck frequency:  once a month  Fasting glucose:   Post prandial:  Evening:  Before meals: Taking Insulin?: no  Long acting insulin:  Short acting insulin: Blood Pressure Monitoring: daily Retinal Examination: Up to Date Foot Exam: Up to Date Diabetic Education: Completed Pneumovax: Up to Date Influenza: Up to Date Aspirin: yes   Past Medical History:  Diagnosis Date   Anemia    Chronic kidney disease    STAGE 4   Coronary atherosclerosis    CABG X 4   Diabetes mellitus without complication (HCC)    Edema    ankle   GERD (gastroesophageal reflux disease)    Glaucoma    Hearing loss    Heart murmur    History of hiatal hernia    Hyperlipidemia    Hypertension    Mini stroke ([place])    Mitral insufficiency    Myocardial infarction ([place])    mild, heart cath done no stents needed   Stroke [facility]) 2011   TIA    Past Surgical History:   Procedure Laterality Date   CAPD INSERTION N/A 03/12/2019   Procedure: CONTINUOUS AMBULATORY PERITONEAL DIALYSIS (CAPD) CATHETER;  Surgeon: Algernon Huxley, MD;  Location: [facility] ORS;  Service: Vascular;  Laterality: N/A;   CATARACT EXTRACTION W/PHACO Left 09/05/2016   Procedure: CATARACT EXTRACTION PHACO AND INTRAOCULAR LENS PLACEMENT ([place]);  Surgeon: Birder Robson, MD;  Location: [facility] ORS;  Service: Ophthalmology;  Laterality: Left;  [place] 00:43.1 AP% 22.6 CDE 9.77 Fluid Pack lot #[id number]   CATARACT EXTRACTION W/PHACO Right 09/26/2016   Procedure: CATARACT EXTRACTION PHACO AND INTRAOCULAR LENS PLACEMENT ([facility]);  Surgeon: Birder Robson, MD;  Location: [facility] ORS;  Service: Ophthalmology;  Laterality: Right;  [place] 01:00 AP% 16.0 CDE 9.71 Fluid pack lot # [id number]   CORONARY ARTERY BYPASS GRAFT  2003   DIALYSIS/PERMA CATHETER INSERTION N/A 10/04/2017   Procedure: DIALYSIS/PERMA CATHETER INSERTION;  Surgeon: Algernon Huxley, MD;  Location: [place] [facility];  Service: Cardiovascular;  Laterality: N/A;   DIALYSIS/PERMA CATHETER REMOVAL N/A 11/01/2017   Procedure: DIALYSIS/PERMA CATHETER REMOVAL;  Surgeon: Algernon Huxley, MD;  Location: [place] [facility];  Service: Cardiovascular;  Laterality: N/A;   ESOPHAGOGASTRODUODENOSCOPY (EGD) WITH PROPOFOL N/A 04/25/2016   Procedure: ESOPHAGOGASTRODUODENOSCOPY (EGD) WITH PROPOFOL;  Surgeon: Lucilla Lame, MD;  Location: [facility] ENDOSCOPY;  Service: Endoscopy;  Laterality: N/A;   JOINT REPLACEMENT Right 2010   knee   LEFT HEART CATH AND CORONARY ANGIOGRAPHY N/A 10/04/2017   Procedure: LEFT HEART CATH AND CORONARY ANGIOGRAPHY;  Surgeon: Isaias Cowman, MD;  Location: [place] [facility];  Service: Cardiovascular;  Laterality: N/A;   LEFT HEART CATH AND CORONARY ANGIOGRAPHY Left 07/01/2018   Procedure: LEFT HEART CATH AND CORONARY ANGIOGRAPHY;  Surgeon: Khan, Shaukat A, MD;  Location: [facility] INVASIVE CV LAB;  Service: Cardiovascular;  Laterality: Left;   SKIN CANCER  EXCISION      Family History  Problem Relation Age of Onset   Stroke Mother    Diabetes Mother    Diabetes Sister    Diabetes Maternal Grandmother     Social History   Socioeconomic History   Marital status: Married    Spouse name: Not on file   Number of children: Not on file   Years of education: Not on file   Highest education level: Not on file  Occupational History   Not on file  Tobacco Use   Smoking status: Former    Types: Cigarettes    Quit date: 09/02/1964    Years since quitting: 56.1   Smokeless tobacco: Never  Vaping Use   Vaping Use: Never used  Substance and Sexual Activity   Alcohol use: No    Alcohol/week: 0.0 standard drinks   Drug use: No   Sexual activity: Not Currently  Other Topics Concern   Not on file  Social History Narrative   Not on file   Social Determinants of Health   Financial Resource Strain: Not on file  Food Insecurity: Not on file  Transportation Needs: Not on file  Physical Activity: Not on file  Stress: Not on file  Social Connections: Not on file  Intimate Partner Violence: Not on file    Outpatient Medications Prior to Visit  Medication Sig Dispense Refill   ASPIRIN 81 PO Take by mouth every other day.      atorvastatin (LIPITOR) 80 MG tablet Take 1 tablet (80 mg total) by mouth at bedtime. 90 tablet 1   carvedilol (COREG) 6.25 MG tablet Take 6.25 mg by mouth 2 (two) times daily.     clopidogrel (PLAVIX) 75 MG tablet Take 1 tablet (75 mg total) by mouth daily. 90 tablet 3   ezetimibe (ZETIA) 10 MG tablet Take 1 tablet (10 mg total) by mouth daily. 90 tablet 1   isosorbide mononitrate (IMDUR) 30 MG 24 hr tablet Take 1.5 tablets (45 mg total) by mouth daily. 135 tablet 1   Lancets (ONETOUCH ULTRASOFT) lancets USE TO CHECK BLOOD SUGAR TWICE A DAY 100 each 12   levocetirizine (XYZAL) 5 MG tablet Take 1 tablet (5 mg total) by mouth every evening. 90 tablet 3   Melatonin 10 MG TABS Take 10 mg by mouth at bedtime.      midodrine (PROAMATINE) 5 MG tablet TAKE 1.0 TABLET ORAL THREE TIMES A DAY WITH MEAL     mirtazapine (REMERON) 15 MG tablet Take 15 mg by mouth daily.     Multiple Vitamins-Minerals (CENTRUM SILVER ULTRA MENS PO) Take by mouth daily.     nystatin (MYCOSTATIN/NYSTOP) powder Apply 1 application topically 3 (three) times daily. 15 g 0   ondansetron (ZOFRAN) 4 MG tablet Take 4 mg by mouth every 8 (eight) hours as needed.     sitaGLIPtin (JANUVIA) 25 MG tablet Take 1 tablet (25 mg total) by mouth daily. 90 tablet 1   timolol (TIMOPTIC) 0.5 % ophthalmic solution Place 1 drop into both eyes daily.      torsemide (DEMADEX) 20 MG tablet Take 40 mg by mouth daily.     TURMERIC PO Take 1,300 mg by mouth daily.      carvedilol (  COREG) 12.5 MG tablet Take by mouth 2 (two) times daily.      mirtazapine (REMERON) 7.5 MG tablet Take 7.5 mg by mouth as needed.      Facility-Administered Medications Prior to Visit  Medication Dose Route Frequency Provider Last Rate Last Admin   sodium chloride flush (NS) 0.9 % injection 3 mL  3 mL Intravenous Q12H Cunningham, Kristin, FNP        Allergies  Allergen Reactions   Bee Venom Hives   Iodinated Diagnostic Agents Rash   Metrizamide Rash   Other Rash and Other (See Comments)    Dye    ROS Review of Systems  Constitutional:  Positive for fatigue.  Respiratory: Negative.    Cardiovascular: Negative.   Gastrointestinal:  Positive for constipation. Negative for abdominal pain.  Genitourinary:  Positive for decreased urine volume (on peritoneal dialysis).  Musculoskeletal: Negative.   Skin: Negative.   Neurological:  Positive for dizziness (stable, with quick movements).     Objective:    Physical Exam Vitals and nursing note reviewed.  Constitutional:      Appearance: Normal appearance.  HENT:     Head: Normocephalic.  Eyes:     Conjunctiva/sclera: Conjunctivae normal.  Cardiovascular:     Rate and Rhythm: Normal rate and regular rhythm.      Pulses: Normal pulses.     Heart sounds: Murmur heard.  Pulmonary:     Effort: Pulmonary effort is normal.     Breath sounds: Normal breath sounds.  Abdominal:     Palpations: Abdomen is soft.     Tenderness: There is no abdominal tenderness.  Musculoskeletal:     Cervical back: Normal range of motion.     Right lower leg: No edema.     Left lower leg: No edema.  Skin:    General: Skin is warm and dry.  Neurological:     General: No focal deficit present.     Mental Status: He is alert and oriented to person, place, and time.  Psychiatric:        Mood and Affect: Mood normal.        Behavior: Behavior normal.        Thought Content: Thought content normal.        Judgment: Judgment normal.    BP 127/70   Pulse 88   Temp 97.7 F (36.5 C) (Oral)   Ht 5' 3" (1.6 m)   Wt 132 lb 12.8 oz (60.2 kg)   SpO2 99%   BMI 23.52 kg/m  Wt Readings from Last 3 Encounters:  10/26/20 132 lb 12.8 oz (60.2 kg)  08/02/20 131 lb (59.4 kg)  06/25/20 133 lb 6.4 oz (60.5 kg)     Health Maintenance Due  Topic Date Due   COVID-19 Vaccine (3 - Moderna risk series) 05/27/2019   URINE MICROALBUMIN  07/02/2020    There are no preventive care reminders to display for this patient.  Lab Results  Component Value Date   TSH 3.076 01/24/2019   Lab Results  Component Value Date   WBC 9.4 06/25/2020   HGB 12.5 (L) 06/25/2020   HCT 38.5 06/25/2020   MCV 98 (H) 06/25/2020   PLT 231 06/25/2020   Lab Results  Component Value Date   NA 136 06/25/2020   K 4.6 06/25/2020   CO2 25 06/25/2020   GLUCOSE 110 (H) 06/25/2020   BUN 55 (H) 06/25/2020   CREATININE 8.68 (H) 06/25/2020   BILITOT 0.4 06/25/2020   ALKPHOS   101 06/25/2020   AST 17 06/25/2020   ALT 13 06/25/2020   PROT 8.1 06/25/2020   ALBUMIN 3.5 (L) 06/25/2020   CALCIUM 9.0 06/25/2020   ANIONGAP 12 03/07/2019   EGFR 5 (L) 06/25/2020   Lab Results  Component Value Date   CHOL 120 06/25/2020   Lab Results  Component Value Date    HDL 31 (L) 06/25/2020   Lab Results  Component Value Date   LDLCALC 68 06/25/2020   Lab Results  Component Value Date   TRIG 116 06/25/2020   Lab Results  Component Value Date   CHOLHDL 2.1 03/14/2018   Lab Results  Component Value Date   HGBA1C 5.3 10/26/2020      Assessment & Plan:   Problem List Items Addressed This Visit       Cardiovascular and Mediastinum   Benign hypertension with CKD (chronic kidney disease) stage IV (HCC)    Chronic, stable. Continue current regimen. Labs checked monthly with nephrology at [facility]. He will bring in a copy of labs to next visit.       Relevant Medications   carvedilol (COREG) 6.25 MG tablet   midodrine (PROAMATINE) 5 MG tablet     Endocrine   Diabetes ([place]) - Primary    Chronic, stable. His A1C today was 5.3%, improved from last visit. He has been really watching his diet and limiting sweets. Since blood sugar control was elevated in January, will continue on this dose for now and recheck in 3 months. Consider stopping Januvia if blood sugars are still well controlled next visit.       Relevant Orders   Bayer DCA Hb A1c Waived (Completed)     Genitourinary   ESRD (end stage renal disease) (HCC)    Chronic, stable. Continuing peritoneal dialysis nightly at home. Continue collaboration with nephrology. He will bring a copy of his labs to his next visit.       Other Visit Diagnoses     Need for influenza vaccination       Flu vaccine given today   Relevant Orders   Flu Vaccine QUAD High Dose(Fluad) (Completed)       No orders of the defined types were placed in this encounter.   Follow-up: Return in about 3 months (around 01/25/2021) for dm .   A total of 30 minutes were spent on this encounter today. When total time is documented, this includes both the face-to-face and non-face-to-face time personally spent before, during and after the visit on the date of the encounter.   Charyl Dancer, NP

## 2020-10-26 NOTE — Assessment & Plan Note (Signed)
Chronic, stable. His A1C today was 5.3%, improved from last visit. He has been really watching his diet and limiting sweets. Since blood sugar control was elevated in January, will continue on this dose for now and recheck in 3 months. Consider stopping Januvia if blood sugars are still well controlled next visit.

## 2020-10-27 DIAGNOSIS — D631 Anemia in chronic kidney disease: Secondary | ICD-10-CM | POA: Diagnosis not present

## 2020-10-27 DIAGNOSIS — Z992 Dependence on renal dialysis: Secondary | ICD-10-CM | POA: Diagnosis not present

## 2020-10-27 DIAGNOSIS — D509 Iron deficiency anemia, unspecified: Secondary | ICD-10-CM | POA: Diagnosis not present

## 2020-10-27 DIAGNOSIS — N186 End stage renal disease: Secondary | ICD-10-CM | POA: Diagnosis not present

## 2020-10-28 DIAGNOSIS — N186 End stage renal disease: Secondary | ICD-10-CM | POA: Diagnosis not present

## 2020-10-28 DIAGNOSIS — D631 Anemia in chronic kidney disease: Secondary | ICD-10-CM | POA: Diagnosis not present

## 2020-10-28 DIAGNOSIS — D509 Iron deficiency anemia, unspecified: Secondary | ICD-10-CM | POA: Diagnosis not present

## 2020-10-28 DIAGNOSIS — Z992 Dependence on renal dialysis: Secondary | ICD-10-CM | POA: Diagnosis not present

## 2020-10-29 DIAGNOSIS — D631 Anemia in chronic kidney disease: Secondary | ICD-10-CM | POA: Diagnosis not present

## 2020-10-29 DIAGNOSIS — D509 Iron deficiency anemia, unspecified: Secondary | ICD-10-CM | POA: Diagnosis not present

## 2020-10-29 DIAGNOSIS — Z992 Dependence on renal dialysis: Secondary | ICD-10-CM | POA: Diagnosis not present

## 2020-10-29 DIAGNOSIS — N186 End stage renal disease: Secondary | ICD-10-CM | POA: Diagnosis not present

## 2020-10-30 DIAGNOSIS — D509 Iron deficiency anemia, unspecified: Secondary | ICD-10-CM | POA: Diagnosis not present

## 2020-10-30 DIAGNOSIS — Z992 Dependence on renal dialysis: Secondary | ICD-10-CM | POA: Diagnosis not present

## 2020-10-30 DIAGNOSIS — N186 End stage renal disease: Secondary | ICD-10-CM | POA: Diagnosis not present

## 2020-10-30 DIAGNOSIS — D631 Anemia in chronic kidney disease: Secondary | ICD-10-CM | POA: Diagnosis not present

## 2020-10-31 DIAGNOSIS — D631 Anemia in chronic kidney disease: Secondary | ICD-10-CM | POA: Diagnosis not present

## 2020-10-31 DIAGNOSIS — N186 End stage renal disease: Secondary | ICD-10-CM | POA: Diagnosis not present

## 2020-10-31 DIAGNOSIS — D509 Iron deficiency anemia, unspecified: Secondary | ICD-10-CM | POA: Diagnosis not present

## 2020-10-31 DIAGNOSIS — Z992 Dependence on renal dialysis: Secondary | ICD-10-CM | POA: Diagnosis not present

## 2020-11-01 DIAGNOSIS — N186 End stage renal disease: Secondary | ICD-10-CM | POA: Diagnosis not present

## 2020-11-01 DIAGNOSIS — Z992 Dependence on renal dialysis: Secondary | ICD-10-CM | POA: Diagnosis not present

## 2020-11-01 DIAGNOSIS — D509 Iron deficiency anemia, unspecified: Secondary | ICD-10-CM | POA: Diagnosis not present

## 2020-11-01 DIAGNOSIS — D631 Anemia in chronic kidney disease: Secondary | ICD-10-CM | POA: Diagnosis not present

## 2020-11-02 DIAGNOSIS — N186 End stage renal disease: Secondary | ICD-10-CM | POA: Diagnosis not present

## 2020-11-02 DIAGNOSIS — Z992 Dependence on renal dialysis: Secondary | ICD-10-CM | POA: Diagnosis not present

## 2020-11-02 DIAGNOSIS — D509 Iron deficiency anemia, unspecified: Secondary | ICD-10-CM | POA: Diagnosis not present

## 2020-11-02 DIAGNOSIS — D631 Anemia in chronic kidney disease: Secondary | ICD-10-CM | POA: Diagnosis not present

## 2020-11-03 DIAGNOSIS — D509 Iron deficiency anemia, unspecified: Secondary | ICD-10-CM | POA: Diagnosis not present

## 2020-11-03 DIAGNOSIS — Z992 Dependence on renal dialysis: Secondary | ICD-10-CM | POA: Diagnosis not present

## 2020-11-03 DIAGNOSIS — D631 Anemia in chronic kidney disease: Secondary | ICD-10-CM | POA: Diagnosis not present

## 2020-11-03 DIAGNOSIS — N186 End stage renal disease: Secondary | ICD-10-CM | POA: Diagnosis not present

## 2020-11-04 DIAGNOSIS — Z992 Dependence on renal dialysis: Secondary | ICD-10-CM | POA: Diagnosis not present

## 2020-11-04 DIAGNOSIS — D631 Anemia in chronic kidney disease: Secondary | ICD-10-CM | POA: Diagnosis not present

## 2020-11-04 DIAGNOSIS — N186 End stage renal disease: Secondary | ICD-10-CM | POA: Diagnosis not present

## 2020-11-04 DIAGNOSIS — D509 Iron deficiency anemia, unspecified: Secondary | ICD-10-CM | POA: Diagnosis not present

## 2020-11-05 DIAGNOSIS — N186 End stage renal disease: Secondary | ICD-10-CM | POA: Diagnosis not present

## 2020-11-05 DIAGNOSIS — D509 Iron deficiency anemia, unspecified: Secondary | ICD-10-CM | POA: Diagnosis not present

## 2020-11-05 DIAGNOSIS — D631 Anemia in chronic kidney disease: Secondary | ICD-10-CM | POA: Diagnosis not present

## 2020-11-05 DIAGNOSIS — Z992 Dependence on renal dialysis: Secondary | ICD-10-CM | POA: Diagnosis not present

## 2020-11-06 DIAGNOSIS — Z992 Dependence on renal dialysis: Secondary | ICD-10-CM | POA: Diagnosis not present

## 2020-11-06 DIAGNOSIS — D631 Anemia in chronic kidney disease: Secondary | ICD-10-CM | POA: Diagnosis not present

## 2020-11-06 DIAGNOSIS — D509 Iron deficiency anemia, unspecified: Secondary | ICD-10-CM | POA: Diagnosis not present

## 2020-11-06 DIAGNOSIS — N186 End stage renal disease: Secondary | ICD-10-CM | POA: Diagnosis not present

## 2020-11-07 DIAGNOSIS — N186 End stage renal disease: Secondary | ICD-10-CM | POA: Diagnosis not present

## 2020-11-07 DIAGNOSIS — Z992 Dependence on renal dialysis: Secondary | ICD-10-CM | POA: Diagnosis not present

## 2020-11-07 DIAGNOSIS — D509 Iron deficiency anemia, unspecified: Secondary | ICD-10-CM | POA: Diagnosis not present

## 2020-11-07 DIAGNOSIS — D631 Anemia in chronic kidney disease: Secondary | ICD-10-CM | POA: Diagnosis not present

## 2020-11-08 ENCOUNTER — Ambulatory Visit (INDEPENDENT_AMBULATORY_CARE_PROVIDER_SITE_OTHER): Payer: Medicare Other

## 2020-11-08 DIAGNOSIS — X32XXXA Exposure to sunlight, initial encounter: Secondary | ICD-10-CM | POA: Diagnosis not present

## 2020-11-08 DIAGNOSIS — D692 Other nonthrombocytopenic purpura: Secondary | ICD-10-CM | POA: Diagnosis not present

## 2020-11-08 DIAGNOSIS — L821 Other seborrheic keratosis: Secondary | ICD-10-CM | POA: Diagnosis not present

## 2020-11-08 DIAGNOSIS — D509 Iron deficiency anemia, unspecified: Secondary | ICD-10-CM | POA: Diagnosis not present

## 2020-11-08 DIAGNOSIS — Z992 Dependence on renal dialysis: Secondary | ICD-10-CM | POA: Diagnosis not present

## 2020-11-08 DIAGNOSIS — D2262 Melanocytic nevi of left upper limb, including shoulder: Secondary | ICD-10-CM | POA: Diagnosis not present

## 2020-11-08 DIAGNOSIS — D225 Melanocytic nevi of trunk: Secondary | ICD-10-CM | POA: Diagnosis not present

## 2020-11-08 DIAGNOSIS — D2272 Melanocytic nevi of left lower limb, including hip: Secondary | ICD-10-CM | POA: Diagnosis not present

## 2020-11-08 DIAGNOSIS — N186 End stage renal disease: Secondary | ICD-10-CM | POA: Diagnosis not present

## 2020-11-08 DIAGNOSIS — D631 Anemia in chronic kidney disease: Secondary | ICD-10-CM | POA: Diagnosis not present

## 2020-11-08 DIAGNOSIS — D2261 Melanocytic nevi of right upper limb, including shoulder: Secondary | ICD-10-CM | POA: Diagnosis not present

## 2020-11-08 DIAGNOSIS — D2271 Melanocytic nevi of right lower limb, including hip: Secondary | ICD-10-CM | POA: Diagnosis not present

## 2020-11-08 DIAGNOSIS — L570 Actinic keratosis: Secondary | ICD-10-CM | POA: Diagnosis not present

## 2020-11-08 DIAGNOSIS — Z0000 Encounter for general adult medical examination without abnormal findings: Secondary | ICD-10-CM

## 2020-11-08 DIAGNOSIS — S80821A Blister (nonthermal), right lower leg, initial encounter: Secondary | ICD-10-CM | POA: Diagnosis not present

## 2020-11-08 NOTE — Patient Instructions (Signed)
Health Maintenance, Male Adopting a healthy lifestyle and getting preventive care are important in promoting health and wellness. Ask your health care provider about: The right schedule for you to have regular tests and exams. Things you can do on your own to prevent diseases and keep yourself healthy. What should I know about diet, weight, and exercise? Eat a healthy diet  Eat a diet that includes plenty of vegetables, fruits, low-fat dairy products, and lean protein. Do not eat a lot of foods that are high in solid fats, added sugars, or sodium. Maintain a healthy weight Body mass index (BMI) is a measurement that can be used to identify possible weight problems. It estimates body fat based on height and weight. Your health care provider can help determine your BMI and help you achieve or maintain a healthy weight. Get regular exercise Get regular exercise. This is one of the most important things you can do for your health. Most adults should: Exercise for at least 150 minutes each week. The exercise should increase your heart rate and make you sweat (moderate-intensity exercise). Do strengthening exercises at least twice a week. This is in addition to the moderate-intensity exercise. Spend less time sitting. Even light physical activity can be beneficial. Watch cholesterol and blood lipids Have your blood tested for lipids and cholesterol at 85 years of age, then have this test every 5 years. You may need to have your cholesterol levels checked more often if: Your lipid or cholesterol levels are high. You are older than 85 years of age. You are at high risk for heart disease. What should I know about cancer screening? Many types of cancers can be detected early and may often be prevented. Depending on your health history and family history, you may need to have cancer screening at various ages. This may include screening for: Colorectal cancer. Prostate cancer. Skin cancer. Lung  cancer. What should I know about heart disease, diabetes, and high blood pressure? Blood pressure and heart disease High blood pressure causes heart disease and increases the risk of stroke. This is more likely to develop in people who have high blood pressure readings, are of African descent, or are overweight. Talk with your health care provider about your target blood pressure readings. Have your blood pressure checked: Every 3-5 years if you are 18-39 years of age. Every year if you are 40 years old or older. If you are between the ages of 65 and 75 and are a current or former smoker, ask your health care provider if you should have a one-time screening for abdominal aortic aneurysm (AAA). Diabetes Have regular diabetes screenings. This checks your fasting blood sugar level. Have the screening done: Once every three years after age 45 if you are at a normal weight and have a low risk for diabetes. More often and at a younger age if you are overweight or have a high risk for diabetes. What should I know about preventing infection? Hepatitis B If you have a higher risk for hepatitis B, you should be screened for this virus. Talk with your health care provider to find out if you are at risk for hepatitis B infection. Hepatitis C Blood testing is recommended for: Everyone born from 1945 through 1965. Anyone with known risk factors for hepatitis C. Sexually transmitted infections (STIs) You should be screened each year for STIs, including gonorrhea and chlamydia, if: You are sexually active and are younger than 85 years of age. You are older than 85 years   of age and your health care provider tells you that you are at risk for this type of infection. Your sexual activity has changed since you were last screened, and you are at increased risk for chlamydia or gonorrhea. Ask your health care provider if you are at risk. Ask your health care provider about whether you are at high risk for HIV.  Your health care provider may recommend a prescription medicine to help prevent HIV infection. If you choose to take medicine to prevent HIV, you should first get tested for HIV. You should then be tested every 3 months for as long as you are taking the medicine. Follow these instructions at home: Lifestyle Do not use any products that contain nicotine or tobacco, such as cigarettes, e-cigarettes, and chewing tobacco. If you need help quitting, ask your health care provider. Do not use street drugs. Do not share needles. Ask your health care provider for help if you need support or information about quitting drugs. Alcohol use Do not drink alcohol if your health care provider tells you not to drink. If you drink alcohol: Limit how much you have to 0-2 drinks a day. Be aware of how much alcohol is in your drink. In the [place] one drink equals one 12 oz bottle of beer (355 mL), one 5 oz glass of wine (148 mL), or one 1 oz glass of hard liquor (44 mL). General instructions Schedule regular health, dental, and eye exams. Stay current with your vaccines. Tell your health care provider if: You often feel depressed. You have ever been abused or do not feel safe at home. Summary Adopting a healthy lifestyle and getting preventive care are important in promoting health and wellness. Follow your health care provider's instructions about healthy diet, exercising, and getting tested or screened for diseases. Follow your health care provider's instructions on monitoring your cholesterol and blood pressure. This information is not intended to replace advice given to you by your health care provider. Make sure you discuss any questions you have with your health care provider. Document Revised: 04/09/2020 Document Reviewed: 01/23/2018 [employer] Patient Education  2022 [employer]  

## 2020-11-08 NOTE — Progress Notes (Signed)
Subjective:   Ray Lamb is a 87 y.o. male who presents for Medicare Annual/Subsequent preventive examination.  I connected with  [name] on 11/08/20 by an audio only telemedicine application and verified that I am speaking with the correct person using two identifiers.   I discussed the limitations, risks, security and privacy concerns of performing an evaluation and management service by telephone and the availability of in person appointments. I also discussed with the patient that there may be a patient responsible charge related to this service. The patient expressed understanding and verbally consented to this telephonic visit.  Location of Patient: home Location of Provider: patient  List any persons and their role that are participating in the visit with the patient.    Review of Systems    Defer to PCP.  Cardiac Risk Factors include: advanced age (>8men, >55 women);diabetes mellitus;male gender     Objective:    Today's Vitals   11/08/20 1703  PainSc: 0-No pain   There is no height or weight on file to calculate BMI.  Advanced Directives 11/08/2020 06/03/2019 03/12/2019 03/10/2019 03/07/2019 02/20/2019 01/24/2019  Does Patient Have a Medical Advance Directive? Yes No No No No No No  Type of Paramedic of [place];Living will - - - - - -  Does patient want to make changes to medical advance directive? No - Patient declined - - - - - -  Copy of [place] in Chart? No - copy requested - - - - - -  Would patient like information on creating a medical advance directive? - No - Patient declined No - Patient declined No - Patient declined No - Patient declined - No - Patient declined    Current Medications (verified) Outpatient Encounter Medications as of 11/08/2020  Medication Sig   ASPIRIN 81 PO Take by mouth every other day.    atorvastatin (LIPITOR) 80 MG tablet Take 1 tablet (80 mg total) by mouth at bedtime.   carvedilol  (COREG) 6.25 MG tablet Take 6.25 mg by mouth 2 (two) times daily.   clopidogrel (PLAVIX) 75 MG tablet Take 1 tablet (75 mg total) by mouth daily.   ezetimibe (ZETIA) 10 MG tablet Take 1 tablet (10 mg total) by mouth daily.   isosorbide mononitrate (IMDUR) 30 MG 24 hr tablet Take 1.5 tablets (45 mg total) by mouth daily.   Lancets (ONETOUCH ULTRASOFT) lancets USE TO CHECK BLOOD SUGAR TWICE A DAY   levocetirizine (XYZAL) 5 MG tablet Take 1 tablet (5 mg total) by mouth every evening.   Melatonin 10 MG TABS Take 10 mg by mouth at bedtime.   midodrine (PROAMATINE) 5 MG tablet TAKE 1.0 TABLET ORAL THREE TIMES A DAY WITH MEAL   mirtazapine (REMERON) 15 MG tablet Take 15 mg by mouth daily.   Multiple Vitamins-Minerals (CENTRUM SILVER ULTRA MENS PO) Take by mouth daily.   nystatin (MYCOSTATIN/NYSTOP) powder Apply 1 application topically 3 (three) times daily.   ondansetron (ZOFRAN) 4 MG tablet Take 4 mg by mouth every 8 (eight) hours as needed.   sitaGLIPtin (JANUVIA) 25 MG tablet Take 1 tablet (25 mg total) by mouth daily.   timolol (TIMOPTIC) 0.5 % ophthalmic solution Place 1 drop into both eyes daily.    torsemide (DEMADEX) 20 MG tablet Take 40 mg by mouth daily.   TURMERIC PO Take 1,300 mg by mouth daily.    Facility-Administered Encounter Medications as of 11/08/2020  Medication   sodium chloride flush (NS)  0.9 % injection 3 mL    Allergies (verified) Bee venom, Iodinated diagnostic agents, Metrizamide, and Other   History: Past Medical History:  Diagnosis Date   Anemia    Chronic kidney disease    STAGE 4   Coronary atherosclerosis    CABG X 4   Diabetes mellitus without complication (HCC)    Edema    ankle   GERD (gastroesophageal reflux disease)    Glaucoma    Hearing loss    Heart murmur    History of hiatal hernia    Hyperlipidemia    Hypertension    Mini stroke [facility])    Mitral insufficiency    Myocardial infarction ([place])    mild, heart cath done no stents needed    Stroke [facility]) 2011   TIA   Past Surgical History:  Procedure Laterality Date   CAPD INSERTION N/A 03/12/2019   Procedure: CONTINUOUS AMBULATORY PERITONEAL DIALYSIS (CAPD) CATHETER;  Surgeon: Algernon Huxley, MD;  Location: [facility] ORS;  Service: Vascular;  Laterality: N/A;   CATARACT EXTRACTION W/PHACO Left 09/05/2016   Procedure: CATARACT EXTRACTION PHACO AND INTRAOCULAR LENS PLACEMENT ([place]);  Surgeon: Birder Robson, MD;  Location: [facility] ORS;  Service: Ophthalmology;  Laterality: Left;  [place] 00:43.1 AP% 22.6 CDE 9.77 Fluid Pack lot #[id number]   CATARACT EXTRACTION W/PHACO Right 09/26/2016   Procedure: CATARACT EXTRACTION PHACO AND INTRAOCULAR LENS PLACEMENT (IOC);  Surgeon: Birder Robson, MD;  Location: [facility] ORS;  Service: Ophthalmology;  Laterality: Right;  [place] 01:00 AP% 16.0 CDE 9.71 Fluid pack lot # [id number]   CORONARY ARTERY BYPASS GRAFT  2003   DIALYSIS/PERMA CATHETER INSERTION N/A 10/04/2017   Procedure: DIALYSIS/PERMA CATHETER INSERTION;  Surgeon: Algernon Huxley, MD;  Location: [place] CV LAB;  Service: Cardiovascular;  Laterality: N/A;   DIALYSIS/PERMA CATHETER REMOVAL N/A 11/01/2017   Procedure: DIALYSIS/PERMA CATHETER REMOVAL;  Surgeon: Algernon Huxley, MD;  Location: [place] CV LAB;  Service: Cardiovascular;  Laterality: N/A;   ESOPHAGOGASTRODUODENOSCOPY (EGD) WITH PROPOFOL N/A 04/25/2016   Procedure: ESOPHAGOGASTRODUODENOSCOPY (EGD) WITH PROPOFOL;  Surgeon: Lucilla Lame, MD;  Location: [facility] ENDOSCOPY;  Service: Endoscopy;  Laterality: N/A;   JOINT REPLACEMENT Right 2010   knee   LEFT HEART CATH AND CORONARY ANGIOGRAPHY N/A 10/04/2017   Procedure: LEFT HEART CATH AND CORONARY ANGIOGRAPHY;  Surgeon: Isaias Cowman, MD;  Location: [place] CV LAB;  Service: Cardiovascular;  Laterality: N/A;   LEFT HEART CATH AND CORONARY ANGIOGRAPHY Left 07/01/2018   Procedure: LEFT HEART CATH AND CORONARY ANGIOGRAPHY;  Surgeon: Dionisio David, MD;  Location: [place] CV LAB;   Service: Cardiovascular;  Laterality: Left;   SKIN CANCER EXCISION     Family History  Problem Relation Age of Onset   Stroke Mother    Diabetes Mother    Diabetes Sister    Diabetes Maternal Grandmother    Social History   Socioeconomic History   Marital status: Married    Spouse name: Not on file   Number of children: Not on file   Years of education: Not on file   Highest education level: Not on file  Occupational History   Not on file  Tobacco Use   Smoking status: Former    Types: Cigarettes    Quit date: 09/02/1964    Years since quitting: 56.2   Smokeless tobacco: Never  Vaping Use   Vaping Use: Never used  Substance and Sexual Activity   Alcohol use: No    Alcohol/week: 0.0 standard drinks   Drug  use: No   Sexual activity: Not Currently  Other Topics Concern   Not on file  Social History Narrative   Not on file   Social Determinants of Health   Financial Resource Strain: Low Risk    Difficulty of Paying Living Expenses: Not hard at all  Food Insecurity: No Food Insecurity   Worried About Charity fundraiser in the Last Year: Never true   [place] in the Last Year: Never true  Transportation Needs: No Transportation Needs   Lack of Transportation (Medical): No   Lack of Transportation (Non-Medical): No  Physical Activity: Inactive   Days of Exercise per Week: 0 days   Minutes of Exercise per Session: 0 min  Stress: No Stress Concern Present   Feeling of Stress : Not at all  Social Connections: Moderately Isolated   Frequency of Communication with Friends and Family: More than three times a week   Frequency of Social Gatherings with Friends and Family: Once a week   Attends Religious Services: Never   Marine scientist or Organizations: No   Attends Music therapist: Never   Marital Status: Married    Tobacco Counseling Counseling given: Not Answered   Clinical Intake:  Pre-visit preparation completed: Yes  Pain :  No/denies pain Pain Score: 0-No pain     Nutritional Risks: None Diabetes: Yes  How often do you need to have someone help you when you read instructions, pamphlets, or other written materials from your doctor or pharmacy?: 2 - Rarely What is the last grade level you completed in school?: Bachelor's Degree  Diabetic?Yes  Interpreter Needed?: No      Activities of Daily Living In your present state of health, do you have any difficulty performing the following activities: 11/08/2020 03/15/2020  Hearing? N Y  Vision? Y N  Comment wears hearing aids -  Difficulty concentrating or making decisions? N N  Walking or climbing stairs? Y Y  Dressing or bathing? N N  Doing errands, shopping? Y N  Preparing Food and eating ? Y -  Using the Toilet? N -  In the past six months, have you accidently leaked urine? N -  Do you have problems with loss of bowel control? N -  Managing your Medications? N -  Managing your Finances? N -  Housekeeping or managing your Housekeeping? N -  Some recent data might be hidden    Patient Care Team: Valerie Roys, DO as PCP - General (Family Medicine) Murlean Iba, MD (Internal Medicine) Dionisio David, MD as Consulting Physician (Cardiology) Sharlotte Alamo, [place] (Podiatry) Ronita Hipps, Elmer Ramp., MD (Dentistry) Lucilla Lame, MD as Consulting Physician (Gastroenterology) Vladimir Crofts, MD as Consulting Physician (Neurology) Sindy Guadeloupe, MD as Consulting Physician (Oncology) Vladimir Faster, [facility] as Pharmacist (Pharmacist)  Indicate any recent Medical Services you may have received from other than [facility] providers in the past year (date may be approximate).     Assessment:   This is a routine wellness examination for [place].  Hearing/Vision screen No results found.  Dietary issues and exercise activities discussed: Current Exercise Habits: The patient does not participate in regular exercise at present, Exercise limited by: None identified    Goals Addressed   None   Depression Screen PHQ 2/9 Scores 11/08/2020 03/15/2020 02/20/2019 02/14/2018 12/27/2017 11/19/2017 06/05/2017  PHQ - 2 Score 0 0 0 0 2 2 0  PHQ- 9 Score - - - - 3 10 -  Fall Risk Fall Risk  11/08/2020 03/15/2020 02/20/2019 03/14/2018 02/14/2018  Falls in the past year? 0 1 0 0 0  Number falls in past yr: 0 0 0 0 -  Injury with Fall? 0 0 0 0 -  Comment - - - - -  Risk Factor Category  - - - - -  Risk for fall due to : Impaired balance/gait;Impaired mobility - - - -  Follow up Falls evaluation completed - - - -    FALL RISK PREVENTION PERTAINING TO THE HOME:  Any stairs in or around the home? Yes  If so, are there any without handrails? No  Home free of loose throw rugs in walkways, pet beds, electrical cords, etc? Yes  Adequate lighting in your home to reduce risk of falls? Yes   ASSISTIVE DEVICES UTILIZED TO PREVENT FALLS:  Life alert? No  Use of a cane, walker or w/c? Yes  Grab bars in the bathroom? Yes  Shower chair or bench in shower? Yes  Elevated toilet seat or a handicapped toilet? Yes   TIMED UP AND GO:  Was the test performed?  N/A .  Length of time to ambulate 10 feet: N/A sec.     Cognitive Function: MMSE - Mini Mental State Exam 02/01/2016  Orientation to time 5  Orientation to Place 5  Registration 2  Attention/ Calculation 3  Recall 3  Language- name 2 objects 2  Language- repeat 1  Language- follow 3 step command 3  Language- read & follow direction 1  Write a sentence 1  Copy design 1  Total score 27     6CIT Screen 11/08/2020 02/14/2018 12/11/2016  What Year? 0 points 0 points 0 points  What month? 0 points 0 points 0 points  What time? 0 points 0 points 0 points  Count back from 20 0 points 0 points 0 points  Months in reverse 2 points 0 points 0 points  Repeat phrase 0 points 4 points 2 points  Total Score 2 4 2     Immunizations Immunization History  Administered Date(s) Administered   Fluad Quad(high Dose 65+)  11/27/2018, 10/26/2020   Influenza, High Dose Seasonal PF 11/26/2015, 12/11/2016, 12/05/2017, 11/12/2019   Influenza,inj,Quad PF,6+ Mos 12/03/2014   Influenza-Unspecified 11/12/2013, 10/15/2018   Moderna Sars-Covid-2 Vaccination 04/01/2019, 04/29/2019   PPD Test 10/05/2017   Pneumococcal Conjugate-13 09/03/2013   Pneumococcal Polysaccharide-23 10/15/1995, 11/13/2000   Pneumococcal-Unspecified 10/15/2018   Td 08/08/2004, 10/06/2015   Zoster Recombinat (Shingrix) 09/22/2020   Zoster, Live 02/26/2006    TDAP status: Up to date  Flu Vaccine status: Up to date  Pneumococcal vaccine status: Up to date  Covid-19 vaccine status: Information provided on how to obtain vaccines.   Qualifies for Shingles Vaccine? Yes   Zostavax completed Yes   Shingrix Completed?: No.    Education has been provided regarding the importance of this vaccine. Patient has been advised to call insurance company to determine out of pocket expense if they have not yet received this vaccine. Advised may also receive vaccine at local pharmacy or Health Dept. Verbalized acceptance and understanding.  Screening Tests Health Maintenance  Topic Date Due   COVID-19 Vaccine (3 - Moderna risk series) 05/27/2019   URINE MICROALBUMIN  07/02/2020   Zoster Vaccines- Shingrix (2 of 2) 11/17/2020   FOOT EXAM  12/11/2020   OPHTHALMOLOGY EXAM  03/17/2021   HEMOGLOBIN A1C  04/25/2021   TETANUS/TDAP  10/05/2025   INFLUENZA VACCINE  Completed   HPV VACCINES  Aged Out    Health Maintenance  Health Maintenance Due  Topic Date Due   COVID-19 Vaccine (3 - Moderna risk series) 05/27/2019   URINE MICROALBUMIN  07/02/2020    Colorectal cancer screening: No longer required.   Lung Cancer Screening: (Low Dose CT Chest recommended if Age 60-80 years, 30 pack-year currently smoking OR have quit w/in 15years.) does not qualify.   Lung Cancer Screening Referral: N/A  Additional Screening:  Hepatitis C Screening: does not  qualify; Completed N/A  Vision Screening: Recommended annual ophthalmology exams for early detection of glaucoma and other disorders of the eye. Is the patient up to date with their annual eye exam?  Yes  Who is the provider or what is the name of the office in which the patient attends annual eye exams? [facility] If pt is not established with a provider, would they like to be referred to a provider to establish care? No .   Dental Screening: Recommended annual dental exams for proper oral hygiene  Community Resource Referral / Chronic Care Management: CRR required this visit?  No   CCM required this visit?  No      Plan:     I have personally reviewed and noted the following in the patient's chart:   Medical and social history Use of alcohol, tobacco or illicit drugs  Current medications and supplements including opioid prescriptions. Patient is not currently taking opioid prescriptions. Functional ability and status Nutritional status Physical activity Advanced directives List of other physicians Hospitalizations, surgeries, and ER visits in previous 12 months Vitals Screenings to include cognitive, depression, and falls Referrals and appointments  In addition, I have reviewed and discussed with patient certain preventive protocols, quality metrics, and best practice recommendations. A written personalized care plan for preventive services as well as general preventive health recommendations were provided to patient.   Mr. Broad , Thank you for taking time to come for your Medicare Wellness Visit. I appreciate your ongoing commitment to your health goals. Please review the following plan we discussed and let me know if I can assist you in the future.   These are the goals we discussed:  Goals       Increase water intake      Recommend drinking at least 5-6 glasses of water a day       [place] (see longitudinal plan of care  for additional care plan information)  Current Barriers:  Chronic Disease Management support, education, and care coordination needs related to Hypertension, Hyperlipidemia, Diabetes, Heart Failure, Coronary Artery Disease, Chronic Kidney Disease, and Alzheimers dementia   Hypertension/ESRD/CHF BP Readings from Last 3 Encounters:  12/12/19 120/72  07/16/19 137/72  07/03/19 (!) 151/69  Pharmacist Clinical Goal(s): Over the next 60  days, patient will work with PharmD and providers to maintain BP goal <140/90 Current regimen:  Carvedilol 12.5mg  qd Torsemide 40 mg  Isosorbide mononitrate ER 45 mg daily Interventions: Comprehensive medication review performed, medication list updated in electronic medical record Reviewed current rx does not reflect once daily dosing of carvedilol. Patient self care activities - Over the next 30 days, patient will: Check BP daily, document, and provide at future appointments Ensure daily salt intake < 2000 mg/day  Hyperlipidemia/ CAD/ Previous CVA Lab Results  Component Value Date/Time   [facility] 61 12/12/2019 03:11 PM  Pharmacist Clinical Goal(s): Over the next 60 days, patient will work with PharmD and providers to maintain LDL  goal < 70 Current regimen:  Atorvastatin 80 mg daily Aspirin 81 mg qod ezetemibe 10 mg qd Clopidogrel 75 mg qd Interventions: Comprehensive medication review performed, medication list updated in electronic medical record Provided diet and exercise counseling. Reviewed signs of bleeding Reviewed fill history Patient self care activities - Over the next 60 days, patient will: Take medications as prescribed  Diabetes Lab Results  Component Value Date/Time   HGBA1C 6.1 11/20/2019 12:00 AM   HGBA1C 9.1 (H) 07/03/2019 11:42 AM   HGBA1C 5.6 04/04/2019 10:40 AM   HGBA1C 7.2 10/06/2015 12:00 AM  Pharmacist Clinical Goal(s): Over the next 60 days, patient will work with PharmD and providers to achieve A1c goal <8% Current  regimen:  Januvia 25 mg daily Interventions: Reviewed goal glucose readings for an A1c of <7%, we want to see fasting sugars <130 and 2 hour after meal sugars <180.  Reviewed signs/symptoms of hypoglycemia Reviewed fill history Provided diet and exercise counseling. Patient self care activities - Over the next 60 days, patient will: Check blood sugar once daily and 3-4 times weekly, document, and provide at future appointments Contact provider with any episodes of hypoglycemia Reduce dietary carbohydrate, "sweets" intake    Medication management Pharmacist Clinical Goal(s): Over the next 60 days, patient will work with PharmD and providers to achieve optimal medication adherence Current pharmacy: [facility] Interventions Comprehensive medication review performed. Continue current medication management strategy. Patient self care activities - Over the next 60 days, patient will: Focus on medication adherence by using pill box and fill dates. Take medications as prescribed Report any questions or concerns to PharmD and/or provider(s)  Please see past updates related to this goal by clicking on the "Past Updates" button in the selected goal        SW- "I will benefit from follow up calls, education and support." (pt-stated)       Timeframe:  Long-Range Goal Priority:  Medium  Start Date:  03/29/20                         Expected End Date:  05/24/20                    Follow Up Date- 05/24/20   CARE PLAN ENTRY (see longitudinal plan of care for additional care plan information)  Current Barriers:  Level of care concerns ADL IADL limitations Social Isolation Inability to perform ADL's independently Inability to perform IADL's independently  Clinical Social Work Clinical Goal(s):   Over the next 120 days, patient/caregiver will work with SW to address concerns related to care coordination needs and lack of education/support/resource connection. LCSW will assist patient in gaining  community resource education and additional support and resource connection as well in order to maintain health and mental health appropriately  Over the next 120 days, patient will demonstrate improved adherence to self care as evidenced by implementing healthy self-care into his daily routine such as: attending all medical appointments, deep breathing exercises, taking time for self-reflection, taking medications as prescribed, drinking water and daily exercise to improve mobility and mood.  Over the next 120 days, patient will demonstrate improved health management independence as evidenced by implementing healthy self-care skills and positive support/resources into his daily routine to help cope with stressors and improve overall health and well-being  Over the next 120 days, patient or caregiver will verbalize basic understanding of depression/stress process and self health management plan as evidenced by her participation in development  of long term plan of care and institution of self health management strategies  Interventions: Inter-disciplinary care team collaboration (see longitudinal plan of care) Patient interviewed and appropriate assessments performed Patient reports that he tries to get socialization where he can. Patient was able to travel to the beach with his spouse, son and daughter in law in the summer of 2021 which provided him activity and socialization.  Patient lives with spouse in their home and they both assist one another with various needs.Patient reports that he is receiving at home dialysis treatment. Patient was recently set up with a nutritionist through his dialysis physician. He reports that this has helped to improve his eating habits, mobility and strength. He reports that he is on a low salt diet and sees his nutritionist regularly. Update 05/24/20- Patient's kidney physician prescribed an appetite medication for patient to help improve his eating habits but spouse and  patient report that his appetite remains low. Patient reports that he is able to eat breakfast usually as this is his favorite meal. He reports that he ate grits, eggs, peanut butter and jelly toast this morning. He shares that her drinks boost protein drinks regularly as well. CCM LCSW will update PCP.  Provided mental health counseling with regard to acceptance. Patient is having a difficulty time adjusting to his new way of life and that he is unable to do what he once could, due to health reasons. Extensive emotional support provided to patient. Patient confirms having a very strong support network that he can lean on whenever he is struggling with this specific concern. He declines wanting mental health support implementation at this time. Provided patient with information about available in home support resources, day programs and senior resources/senior centers. Patient denies needing these resources at this time. Patient denies needing HH referral at this time. Patient and spouse will hire in home aide to assist with house work and light cleaning if needed. Patient's son and grandson live locally and are able to provide assistance when needed. Patient's grandson is the primary caretaker but their son is his secondary back-up as he lives further away in [place].  Patient and spouse deny any recent falls  LCSW provided patient with [employer]'s number as his spouse has an upcoming appointment at [facility] and they will need a stable transportation to and from this.  Discussed plans with patient for ongoing care management follow up and provided patient with direct contact information for care management team Advised patient to contact CCM program or [facility] front desk for any case management needs that arise.  Assisted patient/caregiver with obtaining information about health plan benefits Provided education and assistance to client regarding Advanced Directives. Provided education to patient/caregiver regarding  level of care options. Provided education to patient/caregiver about Hospice and/or Palliative Care services Patient reports that he continues to take his Remeron medication regularly at night. He reports that his sleeping habits have improved and there are some nights that he can go to sleep without taking his melatonin.  Patient attended his neurologist on 04/27/20. He reports that this visit went well. His neurologist made a referral for [facility] and these services have started.  Patient is agreeable to social work discharge at this time due to not having any current social work related concerns. However, patient will contact practice if any future social work needs arise to restart social work involvement.   Patient Self Care Activities:  Patient verbalizes understanding of plan to contact CFP or CCM team  with any case management needs or concerns  Attends all scheduled provider appointments Calls provider office for new concerns or questions Lacks social connections Unable to perform ADLs independently Unable to perform IADLs independently  Please see past updates related to this goal by clicking on the "Past Updates" button in the selected goal         This is a list of the screening recommended for you and due dates:  Health Maintenance  Topic Date Due   COVID-19 Vaccine (3 - Moderna risk series) 05/27/2019   Urine Protein Check  07/02/2020   Zoster (Shingles) Vaccine (2 of 2) 11/17/2020   Complete foot exam   12/11/2020   Eye exam for diabetics  03/17/2021   Hemoglobin A1C  04/25/2021   Tetanus Vaccine  10/05/2025   Flu Shot  Completed   HPV Vaccine  Aged Out        [place], [place]   11/08/2020   Nurse Notes: Non Face to Face 60 Minutes

## 2020-11-09 DIAGNOSIS — D631 Anemia in chronic kidney disease: Secondary | ICD-10-CM | POA: Diagnosis not present

## 2020-11-09 DIAGNOSIS — N186 End stage renal disease: Secondary | ICD-10-CM | POA: Diagnosis not present

## 2020-11-09 DIAGNOSIS — Z992 Dependence on renal dialysis: Secondary | ICD-10-CM | POA: Diagnosis not present

## 2020-11-09 DIAGNOSIS — D509 Iron deficiency anemia, unspecified: Secondary | ICD-10-CM | POA: Diagnosis not present

## 2020-11-10 DIAGNOSIS — N186 End stage renal disease: Secondary | ICD-10-CM | POA: Diagnosis not present

## 2020-11-10 DIAGNOSIS — D509 Iron deficiency anemia, unspecified: Secondary | ICD-10-CM | POA: Diagnosis not present

## 2020-11-10 DIAGNOSIS — Z992 Dependence on renal dialysis: Secondary | ICD-10-CM | POA: Diagnosis not present

## 2020-11-10 DIAGNOSIS — D631 Anemia in chronic kidney disease: Secondary | ICD-10-CM | POA: Diagnosis not present

## 2020-11-11 DIAGNOSIS — Z992 Dependence on renal dialysis: Secondary | ICD-10-CM | POA: Diagnosis not present

## 2020-11-11 DIAGNOSIS — D509 Iron deficiency anemia, unspecified: Secondary | ICD-10-CM | POA: Diagnosis not present

## 2020-11-11 DIAGNOSIS — N186 End stage renal disease: Secondary | ICD-10-CM | POA: Diagnosis not present

## 2020-11-11 DIAGNOSIS — D631 Anemia in chronic kidney disease: Secondary | ICD-10-CM | POA: Diagnosis not present

## 2020-11-12 DIAGNOSIS — N186 End stage renal disease: Secondary | ICD-10-CM | POA: Diagnosis not present

## 2020-11-12 DIAGNOSIS — Z992 Dependence on renal dialysis: Secondary | ICD-10-CM | POA: Diagnosis not present

## 2020-11-13 DIAGNOSIS — N2581 Secondary hyperparathyroidism of renal origin: Secondary | ICD-10-CM | POA: Diagnosis not present

## 2020-11-13 DIAGNOSIS — Z992 Dependence on renal dialysis: Secondary | ICD-10-CM | POA: Diagnosis not present

## 2020-11-13 DIAGNOSIS — N186 End stage renal disease: Secondary | ICD-10-CM | POA: Diagnosis not present

## 2020-11-13 DIAGNOSIS — D509 Iron deficiency anemia, unspecified: Secondary | ICD-10-CM | POA: Diagnosis not present

## 2020-11-14 DIAGNOSIS — N186 End stage renal disease: Secondary | ICD-10-CM | POA: Diagnosis not present

## 2020-11-14 DIAGNOSIS — D509 Iron deficiency anemia, unspecified: Secondary | ICD-10-CM | POA: Diagnosis not present

## 2020-11-14 DIAGNOSIS — N2581 Secondary hyperparathyroidism of renal origin: Secondary | ICD-10-CM | POA: Diagnosis not present

## 2020-11-14 DIAGNOSIS — Z992 Dependence on renal dialysis: Secondary | ICD-10-CM | POA: Diagnosis not present

## 2020-11-15 DIAGNOSIS — E119 Type 2 diabetes mellitus without complications: Secondary | ICD-10-CM | POA: Diagnosis not present

## 2020-11-15 DIAGNOSIS — N186 End stage renal disease: Secondary | ICD-10-CM | POA: Diagnosis not present

## 2020-11-15 DIAGNOSIS — D509 Iron deficiency anemia, unspecified: Secondary | ICD-10-CM | POA: Diagnosis not present

## 2020-11-15 DIAGNOSIS — Z992 Dependence on renal dialysis: Secondary | ICD-10-CM | POA: Diagnosis not present

## 2020-11-15 DIAGNOSIS — N2581 Secondary hyperparathyroidism of renal origin: Secondary | ICD-10-CM | POA: Diagnosis not present

## 2020-11-16 DIAGNOSIS — N2581 Secondary hyperparathyroidism of renal origin: Secondary | ICD-10-CM | POA: Diagnosis not present

## 2020-11-16 DIAGNOSIS — Z992 Dependence on renal dialysis: Secondary | ICD-10-CM | POA: Diagnosis not present

## 2020-11-16 DIAGNOSIS — D509 Iron deficiency anemia, unspecified: Secondary | ICD-10-CM | POA: Diagnosis not present

## 2020-11-16 DIAGNOSIS — N186 End stage renal disease: Secondary | ICD-10-CM | POA: Diagnosis not present

## 2020-11-17 DIAGNOSIS — Z992 Dependence on renal dialysis: Secondary | ICD-10-CM | POA: Diagnosis not present

## 2020-11-17 DIAGNOSIS — D509 Iron deficiency anemia, unspecified: Secondary | ICD-10-CM | POA: Diagnosis not present

## 2020-11-17 DIAGNOSIS — N186 End stage renal disease: Secondary | ICD-10-CM | POA: Diagnosis not present

## 2020-11-17 DIAGNOSIS — N2581 Secondary hyperparathyroidism of renal origin: Secondary | ICD-10-CM | POA: Diagnosis not present

## 2020-11-18 ENCOUNTER — Other Ambulatory Visit: Payer: Self-pay | Admitting: Family Medicine

## 2020-11-18 DIAGNOSIS — N2581 Secondary hyperparathyroidism of renal origin: Secondary | ICD-10-CM | POA: Diagnosis not present

## 2020-11-18 DIAGNOSIS — Z992 Dependence on renal dialysis: Secondary | ICD-10-CM | POA: Diagnosis not present

## 2020-11-18 DIAGNOSIS — N186 End stage renal disease: Secondary | ICD-10-CM | POA: Diagnosis not present

## 2020-11-18 DIAGNOSIS — D509 Iron deficiency anemia, unspecified: Secondary | ICD-10-CM | POA: Diagnosis not present

## 2020-11-19 DIAGNOSIS — Z992 Dependence on renal dialysis: Secondary | ICD-10-CM | POA: Diagnosis not present

## 2020-11-19 DIAGNOSIS — N186 End stage renal disease: Secondary | ICD-10-CM | POA: Diagnosis not present

## 2020-11-19 DIAGNOSIS — D509 Iron deficiency anemia, unspecified: Secondary | ICD-10-CM | POA: Diagnosis not present

## 2020-11-19 DIAGNOSIS — N2581 Secondary hyperparathyroidism of renal origin: Secondary | ICD-10-CM | POA: Diagnosis not present

## 2020-11-19 NOTE — Telephone Encounter (Signed)
Requested medications are due for refill today yes  Requested medications are on the active medication list yes  Last refill 12/12/19  Last visit 10/26/20  Future visit scheduled 01/2021  Notes to clinic Not a med with protocol, please assess.

## 2020-11-20 DIAGNOSIS — D509 Iron deficiency anemia, unspecified: Secondary | ICD-10-CM | POA: Diagnosis not present

## 2020-11-20 DIAGNOSIS — N2581 Secondary hyperparathyroidism of renal origin: Secondary | ICD-10-CM | POA: Diagnosis not present

## 2020-11-20 DIAGNOSIS — Z992 Dependence on renal dialysis: Secondary | ICD-10-CM | POA: Diagnosis not present

## 2020-11-20 DIAGNOSIS — N186 End stage renal disease: Secondary | ICD-10-CM | POA: Diagnosis not present

## 2020-11-21 DIAGNOSIS — N186 End stage renal disease: Secondary | ICD-10-CM | POA: Diagnosis not present

## 2020-11-21 DIAGNOSIS — Z992 Dependence on renal dialysis: Secondary | ICD-10-CM | POA: Diagnosis not present

## 2020-11-21 DIAGNOSIS — D509 Iron deficiency anemia, unspecified: Secondary | ICD-10-CM | POA: Diagnosis not present

## 2020-11-21 DIAGNOSIS — N2581 Secondary hyperparathyroidism of renal origin: Secondary | ICD-10-CM | POA: Diagnosis not present

## 2020-11-22 DIAGNOSIS — N186 End stage renal disease: Secondary | ICD-10-CM | POA: Diagnosis not present

## 2020-11-22 DIAGNOSIS — D509 Iron deficiency anemia, unspecified: Secondary | ICD-10-CM | POA: Diagnosis not present

## 2020-11-22 DIAGNOSIS — Z992 Dependence on renal dialysis: Secondary | ICD-10-CM | POA: Diagnosis not present

## 2020-11-22 DIAGNOSIS — N2581 Secondary hyperparathyroidism of renal origin: Secondary | ICD-10-CM | POA: Diagnosis not present

## 2020-11-23 DIAGNOSIS — N186 End stage renal disease: Secondary | ICD-10-CM | POA: Diagnosis not present

## 2020-11-23 DIAGNOSIS — N2581 Secondary hyperparathyroidism of renal origin: Secondary | ICD-10-CM | POA: Diagnosis not present

## 2020-11-23 DIAGNOSIS — D509 Iron deficiency anemia, unspecified: Secondary | ICD-10-CM | POA: Diagnosis not present

## 2020-11-23 DIAGNOSIS — Z992 Dependence on renal dialysis: Secondary | ICD-10-CM | POA: Diagnosis not present

## 2020-11-24 DIAGNOSIS — Z992 Dependence on renal dialysis: Secondary | ICD-10-CM | POA: Diagnosis not present

## 2020-11-24 DIAGNOSIS — N2581 Secondary hyperparathyroidism of renal origin: Secondary | ICD-10-CM | POA: Diagnosis not present

## 2020-11-24 DIAGNOSIS — D509 Iron deficiency anemia, unspecified: Secondary | ICD-10-CM | POA: Diagnosis not present

## 2020-11-24 DIAGNOSIS — N186 End stage renal disease: Secondary | ICD-10-CM | POA: Diagnosis not present

## 2020-11-25 DIAGNOSIS — N2581 Secondary hyperparathyroidism of renal origin: Secondary | ICD-10-CM | POA: Diagnosis not present

## 2020-11-25 DIAGNOSIS — D509 Iron deficiency anemia, unspecified: Secondary | ICD-10-CM | POA: Diagnosis not present

## 2020-11-25 DIAGNOSIS — N186 End stage renal disease: Secondary | ICD-10-CM | POA: Diagnosis not present

## 2020-11-25 DIAGNOSIS — Z992 Dependence on renal dialysis: Secondary | ICD-10-CM | POA: Diagnosis not present

## 2020-11-26 DIAGNOSIS — N186 End stage renal disease: Secondary | ICD-10-CM | POA: Diagnosis not present

## 2020-11-26 DIAGNOSIS — Z992 Dependence on renal dialysis: Secondary | ICD-10-CM | POA: Diagnosis not present

## 2020-11-26 DIAGNOSIS — D509 Iron deficiency anemia, unspecified: Secondary | ICD-10-CM | POA: Diagnosis not present

## 2020-11-26 DIAGNOSIS — N2581 Secondary hyperparathyroidism of renal origin: Secondary | ICD-10-CM | POA: Diagnosis not present

## 2020-11-27 DIAGNOSIS — D509 Iron deficiency anemia, unspecified: Secondary | ICD-10-CM | POA: Diagnosis not present

## 2020-11-27 DIAGNOSIS — Z992 Dependence on renal dialysis: Secondary | ICD-10-CM | POA: Diagnosis not present

## 2020-11-27 DIAGNOSIS — N2581 Secondary hyperparathyroidism of renal origin: Secondary | ICD-10-CM | POA: Diagnosis not present

## 2020-11-27 DIAGNOSIS — N186 End stage renal disease: Secondary | ICD-10-CM | POA: Diagnosis not present

## 2020-11-28 DIAGNOSIS — Z992 Dependence on renal dialysis: Secondary | ICD-10-CM | POA: Diagnosis not present

## 2020-11-28 DIAGNOSIS — D509 Iron deficiency anemia, unspecified: Secondary | ICD-10-CM | POA: Diagnosis not present

## 2020-11-28 DIAGNOSIS — N186 End stage renal disease: Secondary | ICD-10-CM | POA: Diagnosis not present

## 2020-11-28 DIAGNOSIS — N2581 Secondary hyperparathyroidism of renal origin: Secondary | ICD-10-CM | POA: Diagnosis not present

## 2020-11-29 DIAGNOSIS — Z992 Dependence on renal dialysis: Secondary | ICD-10-CM | POA: Diagnosis not present

## 2020-11-29 DIAGNOSIS — N2581 Secondary hyperparathyroidism of renal origin: Secondary | ICD-10-CM | POA: Diagnosis not present

## 2020-11-29 DIAGNOSIS — D509 Iron deficiency anemia, unspecified: Secondary | ICD-10-CM | POA: Diagnosis not present

## 2020-11-29 DIAGNOSIS — N186 End stage renal disease: Secondary | ICD-10-CM | POA: Diagnosis not present

## 2020-11-30 DIAGNOSIS — N2581 Secondary hyperparathyroidism of renal origin: Secondary | ICD-10-CM | POA: Diagnosis not present

## 2020-11-30 DIAGNOSIS — D509 Iron deficiency anemia, unspecified: Secondary | ICD-10-CM | POA: Diagnosis not present

## 2020-11-30 DIAGNOSIS — N186 End stage renal disease: Secondary | ICD-10-CM | POA: Diagnosis not present

## 2020-11-30 DIAGNOSIS — Z992 Dependence on renal dialysis: Secondary | ICD-10-CM | POA: Diagnosis not present

## 2020-12-01 DIAGNOSIS — N186 End stage renal disease: Secondary | ICD-10-CM | POA: Diagnosis not present

## 2020-12-01 DIAGNOSIS — N2581 Secondary hyperparathyroidism of renal origin: Secondary | ICD-10-CM | POA: Diagnosis not present

## 2020-12-01 DIAGNOSIS — Z992 Dependence on renal dialysis: Secondary | ICD-10-CM | POA: Diagnosis not present

## 2020-12-01 DIAGNOSIS — D509 Iron deficiency anemia, unspecified: Secondary | ICD-10-CM | POA: Diagnosis not present

## 2020-12-02 DIAGNOSIS — N186 End stage renal disease: Secondary | ICD-10-CM | POA: Diagnosis not present

## 2020-12-02 DIAGNOSIS — N2581 Secondary hyperparathyroidism of renal origin: Secondary | ICD-10-CM | POA: Diagnosis not present

## 2020-12-02 DIAGNOSIS — D509 Iron deficiency anemia, unspecified: Secondary | ICD-10-CM | POA: Diagnosis not present

## 2020-12-02 DIAGNOSIS — Z992 Dependence on renal dialysis: Secondary | ICD-10-CM | POA: Diagnosis not present

## 2020-12-03 DIAGNOSIS — N2581 Secondary hyperparathyroidism of renal origin: Secondary | ICD-10-CM | POA: Diagnosis not present

## 2020-12-03 DIAGNOSIS — Z992 Dependence on renal dialysis: Secondary | ICD-10-CM | POA: Diagnosis not present

## 2020-12-03 DIAGNOSIS — N186 End stage renal disease: Secondary | ICD-10-CM | POA: Diagnosis not present

## 2020-12-03 DIAGNOSIS — D509 Iron deficiency anemia, unspecified: Secondary | ICD-10-CM | POA: Diagnosis not present

## 2020-12-04 DIAGNOSIS — D509 Iron deficiency anemia, unspecified: Secondary | ICD-10-CM | POA: Diagnosis not present

## 2020-12-04 DIAGNOSIS — Z992 Dependence on renal dialysis: Secondary | ICD-10-CM | POA: Diagnosis not present

## 2020-12-04 DIAGNOSIS — N2581 Secondary hyperparathyroidism of renal origin: Secondary | ICD-10-CM | POA: Diagnosis not present

## 2020-12-04 DIAGNOSIS — N186 End stage renal disease: Secondary | ICD-10-CM | POA: Diagnosis not present

## 2020-12-05 DIAGNOSIS — D509 Iron deficiency anemia, unspecified: Secondary | ICD-10-CM | POA: Diagnosis not present

## 2020-12-05 DIAGNOSIS — N2581 Secondary hyperparathyroidism of renal origin: Secondary | ICD-10-CM | POA: Diagnosis not present

## 2020-12-05 DIAGNOSIS — N186 End stage renal disease: Secondary | ICD-10-CM | POA: Diagnosis not present

## 2020-12-05 DIAGNOSIS — Z992 Dependence on renal dialysis: Secondary | ICD-10-CM | POA: Diagnosis not present

## 2020-12-06 DIAGNOSIS — Z992 Dependence on renal dialysis: Secondary | ICD-10-CM | POA: Diagnosis not present

## 2020-12-06 DIAGNOSIS — D509 Iron deficiency anemia, unspecified: Secondary | ICD-10-CM | POA: Diagnosis not present

## 2020-12-06 DIAGNOSIS — N186 End stage renal disease: Secondary | ICD-10-CM | POA: Diagnosis not present

## 2020-12-06 DIAGNOSIS — N2581 Secondary hyperparathyroidism of renal origin: Secondary | ICD-10-CM | POA: Diagnosis not present

## 2020-12-07 DIAGNOSIS — Z992 Dependence on renal dialysis: Secondary | ICD-10-CM | POA: Diagnosis not present

## 2020-12-07 DIAGNOSIS — N2581 Secondary hyperparathyroidism of renal origin: Secondary | ICD-10-CM | POA: Diagnosis not present

## 2020-12-07 DIAGNOSIS — N186 End stage renal disease: Secondary | ICD-10-CM | POA: Diagnosis not present

## 2020-12-07 DIAGNOSIS — D509 Iron deficiency anemia, unspecified: Secondary | ICD-10-CM | POA: Diagnosis not present

## 2020-12-08 DIAGNOSIS — Z992 Dependence on renal dialysis: Secondary | ICD-10-CM | POA: Diagnosis not present

## 2020-12-08 DIAGNOSIS — N2581 Secondary hyperparathyroidism of renal origin: Secondary | ICD-10-CM | POA: Diagnosis not present

## 2020-12-08 DIAGNOSIS — D509 Iron deficiency anemia, unspecified: Secondary | ICD-10-CM | POA: Diagnosis not present

## 2020-12-08 DIAGNOSIS — N186 End stage renal disease: Secondary | ICD-10-CM | POA: Diagnosis not present

## 2020-12-09 DIAGNOSIS — D509 Iron deficiency anemia, unspecified: Secondary | ICD-10-CM | POA: Diagnosis not present

## 2020-12-09 DIAGNOSIS — Z992 Dependence on renal dialysis: Secondary | ICD-10-CM | POA: Diagnosis not present

## 2020-12-09 DIAGNOSIS — N186 End stage renal disease: Secondary | ICD-10-CM | POA: Diagnosis not present

## 2020-12-09 DIAGNOSIS — N2581 Secondary hyperparathyroidism of renal origin: Secondary | ICD-10-CM | POA: Diagnosis not present

## 2020-12-10 DIAGNOSIS — Z992 Dependence on renal dialysis: Secondary | ICD-10-CM | POA: Diagnosis not present

## 2020-12-10 DIAGNOSIS — D509 Iron deficiency anemia, unspecified: Secondary | ICD-10-CM | POA: Diagnosis not present

## 2020-12-10 DIAGNOSIS — N2581 Secondary hyperparathyroidism of renal origin: Secondary | ICD-10-CM | POA: Diagnosis not present

## 2020-12-10 DIAGNOSIS — N186 End stage renal disease: Secondary | ICD-10-CM | POA: Diagnosis not present

## 2020-12-11 DIAGNOSIS — D509 Iron deficiency anemia, unspecified: Secondary | ICD-10-CM | POA: Diagnosis not present

## 2020-12-11 DIAGNOSIS — N2581 Secondary hyperparathyroidism of renal origin: Secondary | ICD-10-CM | POA: Diagnosis not present

## 2020-12-11 DIAGNOSIS — N186 End stage renal disease: Secondary | ICD-10-CM | POA: Diagnosis not present

## 2020-12-11 DIAGNOSIS — Z992 Dependence on renal dialysis: Secondary | ICD-10-CM | POA: Diagnosis not present

## 2020-12-12 DIAGNOSIS — D509 Iron deficiency anemia, unspecified: Secondary | ICD-10-CM | POA: Diagnosis not present

## 2020-12-12 DIAGNOSIS — N186 End stage renal disease: Secondary | ICD-10-CM | POA: Diagnosis not present

## 2020-12-12 DIAGNOSIS — Z992 Dependence on renal dialysis: Secondary | ICD-10-CM | POA: Diagnosis not present

## 2020-12-12 DIAGNOSIS — N2581 Secondary hyperparathyroidism of renal origin: Secondary | ICD-10-CM | POA: Diagnosis not present

## 2020-12-13 DIAGNOSIS — N2581 Secondary hyperparathyroidism of renal origin: Secondary | ICD-10-CM | POA: Diagnosis not present

## 2020-12-13 DIAGNOSIS — D509 Iron deficiency anemia, unspecified: Secondary | ICD-10-CM | POA: Diagnosis not present

## 2020-12-13 DIAGNOSIS — N186 End stage renal disease: Secondary | ICD-10-CM | POA: Diagnosis not present

## 2020-12-13 DIAGNOSIS — Z992 Dependence on renal dialysis: Secondary | ICD-10-CM | POA: Diagnosis not present

## 2020-12-14 DIAGNOSIS — N186 End stage renal disease: Secondary | ICD-10-CM | POA: Diagnosis not present

## 2020-12-14 DIAGNOSIS — Z992 Dependence on renal dialysis: Secondary | ICD-10-CM | POA: Diagnosis not present

## 2020-12-14 DIAGNOSIS — D509 Iron deficiency anemia, unspecified: Secondary | ICD-10-CM | POA: Diagnosis not present

## 2020-12-14 DIAGNOSIS — D631 Anemia in chronic kidney disease: Secondary | ICD-10-CM | POA: Diagnosis not present

## 2020-12-15 DIAGNOSIS — D509 Iron deficiency anemia, unspecified: Secondary | ICD-10-CM | POA: Diagnosis not present

## 2020-12-15 DIAGNOSIS — D631 Anemia in chronic kidney disease: Secondary | ICD-10-CM | POA: Diagnosis not present

## 2020-12-15 DIAGNOSIS — Z992 Dependence on renal dialysis: Secondary | ICD-10-CM | POA: Diagnosis not present

## 2020-12-15 DIAGNOSIS — N186 End stage renal disease: Secondary | ICD-10-CM | POA: Diagnosis not present

## 2020-12-16 DIAGNOSIS — D631 Anemia in chronic kidney disease: Secondary | ICD-10-CM | POA: Diagnosis not present

## 2020-12-16 DIAGNOSIS — D509 Iron deficiency anemia, unspecified: Secondary | ICD-10-CM | POA: Diagnosis not present

## 2020-12-16 DIAGNOSIS — Z992 Dependence on renal dialysis: Secondary | ICD-10-CM | POA: Diagnosis not present

## 2020-12-16 DIAGNOSIS — N186 End stage renal disease: Secondary | ICD-10-CM | POA: Diagnosis not present

## 2020-12-17 DIAGNOSIS — N186 End stage renal disease: Secondary | ICD-10-CM | POA: Diagnosis not present

## 2020-12-17 DIAGNOSIS — D631 Anemia in chronic kidney disease: Secondary | ICD-10-CM | POA: Diagnosis not present

## 2020-12-17 DIAGNOSIS — D509 Iron deficiency anemia, unspecified: Secondary | ICD-10-CM | POA: Diagnosis not present

## 2020-12-17 DIAGNOSIS — Z992 Dependence on renal dialysis: Secondary | ICD-10-CM | POA: Diagnosis not present

## 2020-12-18 DIAGNOSIS — D631 Anemia in chronic kidney disease: Secondary | ICD-10-CM | POA: Diagnosis not present

## 2020-12-18 DIAGNOSIS — N186 End stage renal disease: Secondary | ICD-10-CM | POA: Diagnosis not present

## 2020-12-18 DIAGNOSIS — D509 Iron deficiency anemia, unspecified: Secondary | ICD-10-CM | POA: Diagnosis not present

## 2020-12-18 DIAGNOSIS — Z992 Dependence on renal dialysis: Secondary | ICD-10-CM | POA: Diagnosis not present

## 2020-12-19 DIAGNOSIS — N186 End stage renal disease: Secondary | ICD-10-CM | POA: Diagnosis not present

## 2020-12-19 DIAGNOSIS — D631 Anemia in chronic kidney disease: Secondary | ICD-10-CM | POA: Diagnosis not present

## 2020-12-19 DIAGNOSIS — D509 Iron deficiency anemia, unspecified: Secondary | ICD-10-CM | POA: Diagnosis not present

## 2020-12-19 DIAGNOSIS — Z992 Dependence on renal dialysis: Secondary | ICD-10-CM | POA: Diagnosis not present

## 2020-12-20 DIAGNOSIS — I340 Nonrheumatic mitral (valve) insufficiency: Secondary | ICD-10-CM | POA: Diagnosis not present

## 2020-12-20 DIAGNOSIS — N186 End stage renal disease: Secondary | ICD-10-CM | POA: Diagnosis not present

## 2020-12-20 DIAGNOSIS — D631 Anemia in chronic kidney disease: Secondary | ICD-10-CM | POA: Diagnosis not present

## 2020-12-20 DIAGNOSIS — R0602 Shortness of breath: Secondary | ICD-10-CM | POA: Diagnosis not present

## 2020-12-20 DIAGNOSIS — D509 Iron deficiency anemia, unspecified: Secondary | ICD-10-CM | POA: Diagnosis not present

## 2020-12-20 DIAGNOSIS — I6389 Other cerebral infarction: Secondary | ICD-10-CM | POA: Diagnosis not present

## 2020-12-20 DIAGNOSIS — E785 Hyperlipidemia, unspecified: Secondary | ICD-10-CM | POA: Diagnosis not present

## 2020-12-20 DIAGNOSIS — Z992 Dependence on renal dialysis: Secondary | ICD-10-CM | POA: Diagnosis not present

## 2020-12-20 DIAGNOSIS — I10 Essential (primary) hypertension: Secondary | ICD-10-CM | POA: Diagnosis not present

## 2020-12-20 DIAGNOSIS — K219 Gastro-esophageal reflux disease without esophagitis: Secondary | ICD-10-CM | POA: Diagnosis not present

## 2020-12-20 DIAGNOSIS — I2510 Atherosclerotic heart disease of native coronary artery without angina pectoris: Secondary | ICD-10-CM | POA: Diagnosis not present

## 2020-12-21 DIAGNOSIS — D631 Anemia in chronic kidney disease: Secondary | ICD-10-CM | POA: Diagnosis not present

## 2020-12-21 DIAGNOSIS — Z992 Dependence on renal dialysis: Secondary | ICD-10-CM | POA: Diagnosis not present

## 2020-12-21 DIAGNOSIS — N186 End stage renal disease: Secondary | ICD-10-CM | POA: Diagnosis not present

## 2020-12-21 DIAGNOSIS — D509 Iron deficiency anemia, unspecified: Secondary | ICD-10-CM | POA: Diagnosis not present

## 2020-12-22 DIAGNOSIS — D509 Iron deficiency anemia, unspecified: Secondary | ICD-10-CM | POA: Diagnosis not present

## 2020-12-22 DIAGNOSIS — D631 Anemia in chronic kidney disease: Secondary | ICD-10-CM | POA: Diagnosis not present

## 2020-12-22 DIAGNOSIS — N186 End stage renal disease: Secondary | ICD-10-CM | POA: Diagnosis not present

## 2020-12-22 DIAGNOSIS — Z992 Dependence on renal dialysis: Secondary | ICD-10-CM | POA: Diagnosis not present

## 2020-12-23 DIAGNOSIS — N186 End stage renal disease: Secondary | ICD-10-CM | POA: Diagnosis not present

## 2020-12-23 DIAGNOSIS — D631 Anemia in chronic kidney disease: Secondary | ICD-10-CM | POA: Diagnosis not present

## 2020-12-23 DIAGNOSIS — Z992 Dependence on renal dialysis: Secondary | ICD-10-CM | POA: Diagnosis not present

## 2020-12-23 DIAGNOSIS — D509 Iron deficiency anemia, unspecified: Secondary | ICD-10-CM | POA: Diagnosis not present

## 2020-12-24 DIAGNOSIS — Z992 Dependence on renal dialysis: Secondary | ICD-10-CM | POA: Diagnosis not present

## 2020-12-24 DIAGNOSIS — D509 Iron deficiency anemia, unspecified: Secondary | ICD-10-CM | POA: Diagnosis not present

## 2020-12-24 DIAGNOSIS — D631 Anemia in chronic kidney disease: Secondary | ICD-10-CM | POA: Diagnosis not present

## 2020-12-24 DIAGNOSIS — N186 End stage renal disease: Secondary | ICD-10-CM | POA: Diagnosis not present

## 2020-12-25 DIAGNOSIS — N186 End stage renal disease: Secondary | ICD-10-CM | POA: Diagnosis not present

## 2020-12-25 DIAGNOSIS — Z992 Dependence on renal dialysis: Secondary | ICD-10-CM | POA: Diagnosis not present

## 2020-12-25 DIAGNOSIS — D509 Iron deficiency anemia, unspecified: Secondary | ICD-10-CM | POA: Diagnosis not present

## 2020-12-25 DIAGNOSIS — D631 Anemia in chronic kidney disease: Secondary | ICD-10-CM | POA: Diagnosis not present

## 2020-12-26 DIAGNOSIS — Z992 Dependence on renal dialysis: Secondary | ICD-10-CM | POA: Diagnosis not present

## 2020-12-26 DIAGNOSIS — D509 Iron deficiency anemia, unspecified: Secondary | ICD-10-CM | POA: Diagnosis not present

## 2020-12-26 DIAGNOSIS — N186 End stage renal disease: Secondary | ICD-10-CM | POA: Diagnosis not present

## 2020-12-26 DIAGNOSIS — D631 Anemia in chronic kidney disease: Secondary | ICD-10-CM | POA: Diagnosis not present

## 2020-12-27 DIAGNOSIS — N186 End stage renal disease: Secondary | ICD-10-CM | POA: Diagnosis not present

## 2020-12-27 DIAGNOSIS — Z992 Dependence on renal dialysis: Secondary | ICD-10-CM | POA: Diagnosis not present

## 2020-12-27 DIAGNOSIS — D509 Iron deficiency anemia, unspecified: Secondary | ICD-10-CM | POA: Diagnosis not present

## 2020-12-27 DIAGNOSIS — D631 Anemia in chronic kidney disease: Secondary | ICD-10-CM | POA: Diagnosis not present

## 2020-12-28 DIAGNOSIS — Z992 Dependence on renal dialysis: Secondary | ICD-10-CM | POA: Diagnosis not present

## 2020-12-28 DIAGNOSIS — D509 Iron deficiency anemia, unspecified: Secondary | ICD-10-CM | POA: Diagnosis not present

## 2020-12-28 DIAGNOSIS — D631 Anemia in chronic kidney disease: Secondary | ICD-10-CM | POA: Diagnosis not present

## 2020-12-28 DIAGNOSIS — N186 End stage renal disease: Secondary | ICD-10-CM | POA: Diagnosis not present

## 2020-12-29 DIAGNOSIS — Z992 Dependence on renal dialysis: Secondary | ICD-10-CM | POA: Diagnosis not present

## 2020-12-29 DIAGNOSIS — D509 Iron deficiency anemia, unspecified: Secondary | ICD-10-CM | POA: Diagnosis not present

## 2020-12-29 DIAGNOSIS — N186 End stage renal disease: Secondary | ICD-10-CM | POA: Diagnosis not present

## 2020-12-29 DIAGNOSIS — D631 Anemia in chronic kidney disease: Secondary | ICD-10-CM | POA: Diagnosis not present

## 2020-12-30 DIAGNOSIS — D509 Iron deficiency anemia, unspecified: Secondary | ICD-10-CM | POA: Diagnosis not present

## 2020-12-30 DIAGNOSIS — N186 End stage renal disease: Secondary | ICD-10-CM | POA: Diagnosis not present

## 2020-12-30 DIAGNOSIS — D631 Anemia in chronic kidney disease: Secondary | ICD-10-CM | POA: Diagnosis not present

## 2020-12-30 DIAGNOSIS — Z992 Dependence on renal dialysis: Secondary | ICD-10-CM | POA: Diagnosis not present

## 2020-12-31 DIAGNOSIS — N186 End stage renal disease: Secondary | ICD-10-CM | POA: Diagnosis not present

## 2020-12-31 DIAGNOSIS — D509 Iron deficiency anemia, unspecified: Secondary | ICD-10-CM | POA: Diagnosis not present

## 2020-12-31 DIAGNOSIS — Z992 Dependence on renal dialysis: Secondary | ICD-10-CM | POA: Diagnosis not present

## 2020-12-31 DIAGNOSIS — D631 Anemia in chronic kidney disease: Secondary | ICD-10-CM | POA: Diagnosis not present

## 2021-01-01 DIAGNOSIS — Z992 Dependence on renal dialysis: Secondary | ICD-10-CM | POA: Diagnosis not present

## 2021-01-01 DIAGNOSIS — D631 Anemia in chronic kidney disease: Secondary | ICD-10-CM | POA: Diagnosis not present

## 2021-01-01 DIAGNOSIS — D509 Iron deficiency anemia, unspecified: Secondary | ICD-10-CM | POA: Diagnosis not present

## 2021-01-01 DIAGNOSIS — N186 End stage renal disease: Secondary | ICD-10-CM | POA: Diagnosis not present

## 2021-01-02 DIAGNOSIS — D631 Anemia in chronic kidney disease: Secondary | ICD-10-CM | POA: Diagnosis not present

## 2021-01-02 DIAGNOSIS — D509 Iron deficiency anemia, unspecified: Secondary | ICD-10-CM | POA: Diagnosis not present

## 2021-01-02 DIAGNOSIS — N186 End stage renal disease: Secondary | ICD-10-CM | POA: Diagnosis not present

## 2021-01-02 DIAGNOSIS — Z992 Dependence on renal dialysis: Secondary | ICD-10-CM | POA: Diagnosis not present

## 2021-01-03 DIAGNOSIS — D631 Anemia in chronic kidney disease: Secondary | ICD-10-CM | POA: Diagnosis not present

## 2021-01-03 DIAGNOSIS — Z992 Dependence on renal dialysis: Secondary | ICD-10-CM | POA: Diagnosis not present

## 2021-01-03 DIAGNOSIS — D509 Iron deficiency anemia, unspecified: Secondary | ICD-10-CM | POA: Diagnosis not present

## 2021-01-03 DIAGNOSIS — N186 End stage renal disease: Secondary | ICD-10-CM | POA: Diagnosis not present

## 2021-01-04 DIAGNOSIS — Z992 Dependence on renal dialysis: Secondary | ICD-10-CM | POA: Diagnosis not present

## 2021-01-04 DIAGNOSIS — D509 Iron deficiency anemia, unspecified: Secondary | ICD-10-CM | POA: Diagnosis not present

## 2021-01-04 DIAGNOSIS — N186 End stage renal disease: Secondary | ICD-10-CM | POA: Diagnosis not present

## 2021-01-04 DIAGNOSIS — D631 Anemia in chronic kidney disease: Secondary | ICD-10-CM | POA: Diagnosis not present

## 2021-01-05 DIAGNOSIS — Z992 Dependence on renal dialysis: Secondary | ICD-10-CM | POA: Diagnosis not present

## 2021-01-05 DIAGNOSIS — D509 Iron deficiency anemia, unspecified: Secondary | ICD-10-CM | POA: Diagnosis not present

## 2021-01-05 DIAGNOSIS — D631 Anemia in chronic kidney disease: Secondary | ICD-10-CM | POA: Diagnosis not present

## 2021-01-05 DIAGNOSIS — N186 End stage renal disease: Secondary | ICD-10-CM | POA: Diagnosis not present

## 2021-01-06 DIAGNOSIS — N186 End stage renal disease: Secondary | ICD-10-CM | POA: Diagnosis not present

## 2021-01-06 DIAGNOSIS — D631 Anemia in chronic kidney disease: Secondary | ICD-10-CM | POA: Diagnosis not present

## 2021-01-06 DIAGNOSIS — Z992 Dependence on renal dialysis: Secondary | ICD-10-CM | POA: Diagnosis not present

## 2021-01-06 DIAGNOSIS — D509 Iron deficiency anemia, unspecified: Secondary | ICD-10-CM | POA: Diagnosis not present

## 2021-01-07 DIAGNOSIS — D509 Iron deficiency anemia, unspecified: Secondary | ICD-10-CM | POA: Diagnosis not present

## 2021-01-07 DIAGNOSIS — D631 Anemia in chronic kidney disease: Secondary | ICD-10-CM | POA: Diagnosis not present

## 2021-01-07 DIAGNOSIS — Z992 Dependence on renal dialysis: Secondary | ICD-10-CM | POA: Diagnosis not present

## 2021-01-07 DIAGNOSIS — N186 End stage renal disease: Secondary | ICD-10-CM | POA: Diagnosis not present

## 2021-01-08 DIAGNOSIS — D509 Iron deficiency anemia, unspecified: Secondary | ICD-10-CM | POA: Diagnosis not present

## 2021-01-08 DIAGNOSIS — D631 Anemia in chronic kidney disease: Secondary | ICD-10-CM | POA: Diagnosis not present

## 2021-01-08 DIAGNOSIS — Z992 Dependence on renal dialysis: Secondary | ICD-10-CM | POA: Diagnosis not present

## 2021-01-08 DIAGNOSIS — N186 End stage renal disease: Secondary | ICD-10-CM | POA: Diagnosis not present

## 2021-01-09 DIAGNOSIS — Z992 Dependence on renal dialysis: Secondary | ICD-10-CM | POA: Diagnosis not present

## 2021-01-09 DIAGNOSIS — N186 End stage renal disease: Secondary | ICD-10-CM | POA: Diagnosis not present

## 2021-01-09 DIAGNOSIS — D631 Anemia in chronic kidney disease: Secondary | ICD-10-CM | POA: Diagnosis not present

## 2021-01-09 DIAGNOSIS — D509 Iron deficiency anemia, unspecified: Secondary | ICD-10-CM | POA: Diagnosis not present

## 2021-01-10 DIAGNOSIS — D509 Iron deficiency anemia, unspecified: Secondary | ICD-10-CM | POA: Diagnosis not present

## 2021-01-10 DIAGNOSIS — N186 End stage renal disease: Secondary | ICD-10-CM | POA: Diagnosis not present

## 2021-01-10 DIAGNOSIS — Z992 Dependence on renal dialysis: Secondary | ICD-10-CM | POA: Diagnosis not present

## 2021-01-10 DIAGNOSIS — D631 Anemia in chronic kidney disease: Secondary | ICD-10-CM | POA: Diagnosis not present

## 2021-01-11 DIAGNOSIS — D509 Iron deficiency anemia, unspecified: Secondary | ICD-10-CM | POA: Diagnosis not present

## 2021-01-11 DIAGNOSIS — N186 End stage renal disease: Secondary | ICD-10-CM | POA: Diagnosis not present

## 2021-01-11 DIAGNOSIS — Z992 Dependence on renal dialysis: Secondary | ICD-10-CM | POA: Diagnosis not present

## 2021-01-11 DIAGNOSIS — D631 Anemia in chronic kidney disease: Secondary | ICD-10-CM | POA: Diagnosis not present

## 2021-01-12 DIAGNOSIS — Z992 Dependence on renal dialysis: Secondary | ICD-10-CM | POA: Diagnosis not present

## 2021-01-12 DIAGNOSIS — D509 Iron deficiency anemia, unspecified: Secondary | ICD-10-CM | POA: Diagnosis not present

## 2021-01-12 DIAGNOSIS — N186 End stage renal disease: Secondary | ICD-10-CM | POA: Diagnosis not present

## 2021-01-12 DIAGNOSIS — D631 Anemia in chronic kidney disease: Secondary | ICD-10-CM | POA: Diagnosis not present

## 2021-01-13 DIAGNOSIS — Z992 Dependence on renal dialysis: Secondary | ICD-10-CM | POA: Diagnosis not present

## 2021-01-13 DIAGNOSIS — D509 Iron deficiency anemia, unspecified: Secondary | ICD-10-CM | POA: Diagnosis not present

## 2021-01-13 DIAGNOSIS — D631 Anemia in chronic kidney disease: Secondary | ICD-10-CM | POA: Diagnosis not present

## 2021-01-13 DIAGNOSIS — N186 End stage renal disease: Secondary | ICD-10-CM | POA: Diagnosis not present

## 2021-01-14 DIAGNOSIS — Z992 Dependence on renal dialysis: Secondary | ICD-10-CM | POA: Diagnosis not present

## 2021-01-14 DIAGNOSIS — D509 Iron deficiency anemia, unspecified: Secondary | ICD-10-CM | POA: Diagnosis not present

## 2021-01-14 DIAGNOSIS — D631 Anemia in chronic kidney disease: Secondary | ICD-10-CM | POA: Diagnosis not present

## 2021-01-14 DIAGNOSIS — N186 End stage renal disease: Secondary | ICD-10-CM | POA: Diagnosis not present

## 2021-01-15 DIAGNOSIS — D631 Anemia in chronic kidney disease: Secondary | ICD-10-CM | POA: Diagnosis not present

## 2021-01-15 DIAGNOSIS — N186 End stage renal disease: Secondary | ICD-10-CM | POA: Diagnosis not present

## 2021-01-15 DIAGNOSIS — Z992 Dependence on renal dialysis: Secondary | ICD-10-CM | POA: Diagnosis not present

## 2021-01-15 DIAGNOSIS — D509 Iron deficiency anemia, unspecified: Secondary | ICD-10-CM | POA: Diagnosis not present

## 2021-01-16 DIAGNOSIS — N186 End stage renal disease: Secondary | ICD-10-CM | POA: Diagnosis not present

## 2021-01-16 DIAGNOSIS — D509 Iron deficiency anemia, unspecified: Secondary | ICD-10-CM | POA: Diagnosis not present

## 2021-01-16 DIAGNOSIS — Z992 Dependence on renal dialysis: Secondary | ICD-10-CM | POA: Diagnosis not present

## 2021-01-16 DIAGNOSIS — D631 Anemia in chronic kidney disease: Secondary | ICD-10-CM | POA: Diagnosis not present

## 2021-01-17 DIAGNOSIS — N186 End stage renal disease: Secondary | ICD-10-CM | POA: Diagnosis not present

## 2021-01-17 DIAGNOSIS — D509 Iron deficiency anemia, unspecified: Secondary | ICD-10-CM | POA: Diagnosis not present

## 2021-01-17 DIAGNOSIS — D631 Anemia in chronic kidney disease: Secondary | ICD-10-CM | POA: Diagnosis not present

## 2021-01-17 DIAGNOSIS — Z992 Dependence on renal dialysis: Secondary | ICD-10-CM | POA: Diagnosis not present

## 2021-01-18 DIAGNOSIS — D631 Anemia in chronic kidney disease: Secondary | ICD-10-CM | POA: Diagnosis not present

## 2021-01-18 DIAGNOSIS — N186 End stage renal disease: Secondary | ICD-10-CM | POA: Diagnosis not present

## 2021-01-18 DIAGNOSIS — Z992 Dependence on renal dialysis: Secondary | ICD-10-CM | POA: Diagnosis not present

## 2021-01-18 DIAGNOSIS — D509 Iron deficiency anemia, unspecified: Secondary | ICD-10-CM | POA: Diagnosis not present

## 2021-01-19 DIAGNOSIS — Z992 Dependence on renal dialysis: Secondary | ICD-10-CM | POA: Diagnosis not present

## 2021-01-19 DIAGNOSIS — N186 End stage renal disease: Secondary | ICD-10-CM | POA: Diagnosis not present

## 2021-01-19 DIAGNOSIS — D509 Iron deficiency anemia, unspecified: Secondary | ICD-10-CM | POA: Diagnosis not present

## 2021-01-19 DIAGNOSIS — D631 Anemia in chronic kidney disease: Secondary | ICD-10-CM | POA: Diagnosis not present

## 2021-01-20 DIAGNOSIS — D631 Anemia in chronic kidney disease: Secondary | ICD-10-CM | POA: Diagnosis not present

## 2021-01-20 DIAGNOSIS — D509 Iron deficiency anemia, unspecified: Secondary | ICD-10-CM | POA: Diagnosis not present

## 2021-01-20 DIAGNOSIS — N186 End stage renal disease: Secondary | ICD-10-CM | POA: Diagnosis not present

## 2021-01-20 DIAGNOSIS — Z992 Dependence on renal dialysis: Secondary | ICD-10-CM | POA: Diagnosis not present

## 2021-01-21 DIAGNOSIS — Z992 Dependence on renal dialysis: Secondary | ICD-10-CM | POA: Diagnosis not present

## 2021-01-21 DIAGNOSIS — D509 Iron deficiency anemia, unspecified: Secondary | ICD-10-CM | POA: Diagnosis not present

## 2021-01-21 DIAGNOSIS — N186 End stage renal disease: Secondary | ICD-10-CM | POA: Diagnosis not present

## 2021-01-21 DIAGNOSIS — D631 Anemia in chronic kidney disease: Secondary | ICD-10-CM | POA: Diagnosis not present

## 2021-01-22 DIAGNOSIS — D509 Iron deficiency anemia, unspecified: Secondary | ICD-10-CM | POA: Diagnosis not present

## 2021-01-22 DIAGNOSIS — Z992 Dependence on renal dialysis: Secondary | ICD-10-CM | POA: Diagnosis not present

## 2021-01-22 DIAGNOSIS — D631 Anemia in chronic kidney disease: Secondary | ICD-10-CM | POA: Diagnosis not present

## 2021-01-22 DIAGNOSIS — N186 End stage renal disease: Secondary | ICD-10-CM | POA: Diagnosis not present

## 2021-01-23 DIAGNOSIS — D631 Anemia in chronic kidney disease: Secondary | ICD-10-CM | POA: Diagnosis not present

## 2021-01-23 DIAGNOSIS — D509 Iron deficiency anemia, unspecified: Secondary | ICD-10-CM | POA: Diagnosis not present

## 2021-01-23 DIAGNOSIS — N186 End stage renal disease: Secondary | ICD-10-CM | POA: Diagnosis not present

## 2021-01-23 DIAGNOSIS — Z992 Dependence on renal dialysis: Secondary | ICD-10-CM | POA: Diagnosis not present

## 2021-01-24 ENCOUNTER — Encounter: Payer: Self-pay | Admitting: Family Medicine

## 2021-01-24 ENCOUNTER — Other Ambulatory Visit: Payer: Self-pay

## 2021-01-24 ENCOUNTER — Ambulatory Visit (INDEPENDENT_AMBULATORY_CARE_PROVIDER_SITE_OTHER): Payer: Medicare Other | Admitting: Family Medicine

## 2021-01-24 VITALS — BP 126/74 | HR 63 | Temp 97.5°F | Wt 135.8 lb

## 2021-01-24 DIAGNOSIS — E782 Mixed hyperlipidemia: Secondary | ICD-10-CM

## 2021-01-24 DIAGNOSIS — N185 Chronic kidney disease, stage 5: Secondary | ICD-10-CM | POA: Diagnosis not present

## 2021-01-24 DIAGNOSIS — Z992 Dependence on renal dialysis: Secondary | ICD-10-CM | POA: Diagnosis not present

## 2021-01-24 DIAGNOSIS — D509 Iron deficiency anemia, unspecified: Secondary | ICD-10-CM | POA: Diagnosis not present

## 2021-01-24 DIAGNOSIS — R8281 Pyuria: Secondary | ICD-10-CM

## 2021-01-24 DIAGNOSIS — N184 Chronic kidney disease, stage 4 (severe): Secondary | ICD-10-CM

## 2021-01-24 DIAGNOSIS — E1122 Type 2 diabetes mellitus with diabetic chronic kidney disease: Secondary | ICD-10-CM | POA: Diagnosis not present

## 2021-01-24 DIAGNOSIS — I129 Hypertensive chronic kidney disease with stage 1 through stage 4 chronic kidney disease, or unspecified chronic kidney disease: Secondary | ICD-10-CM | POA: Diagnosis not present

## 2021-01-24 DIAGNOSIS — D631 Anemia in chronic kidney disease: Secondary | ICD-10-CM | POA: Diagnosis not present

## 2021-01-24 DIAGNOSIS — I25709 Atherosclerosis of coronary artery bypass graft(s), unspecified, with unspecified angina pectoris: Secondary | ICD-10-CM | POA: Diagnosis not present

## 2021-01-24 DIAGNOSIS — N186 End stage renal disease: Secondary | ICD-10-CM | POA: Diagnosis not present

## 2021-01-24 DIAGNOSIS — E7800 Pure hypercholesterolemia, unspecified: Secondary | ICD-10-CM | POA: Diagnosis not present

## 2021-01-24 MED ORDER — ATORVASTATIN CALCIUM 80 MG PO TABS: 80.0000 mg | ORAL_TABLET | Freq: Every day | ORAL | 1 refills | Status: AC

## 2021-01-24 MED ORDER — ISOSORBIDE MONONITRATE ER 30 MG PO TB24
45.0000 mg | ORAL_TABLET | Freq: Every day | ORAL | 1 refills | Status: AC
Start: 2021-01-24 — End: ?

## 2021-01-24 MED ORDER — EZETIMIBE 10 MG PO TABS: 10.0000 mg | ORAL_TABLET | Freq: Every day | ORAL | 1 refills | Status: AC

## 2021-01-24 MED ORDER — SITAGLIPTIN PHOSPHATE 25 MG PO TABS: 25.0000 mg | ORAL_TABLET | Freq: Every day | ORAL | 1 refills | Status: AC

## 2021-01-24 NOTE — Progress Notes (Signed)
BP 126/74   Pulse 63   Temp (!) 97.5 F (36.4 C)   Wt 135 lb 12.8 oz (61.6 kg)   SpO2 99%   BMI 24.06 kg/m    Subjective:    Patient ID: Ray Lamb, male    DOB: 1930/08/27, 87 y.o.   MRN: [id number]  HPI: Ray Lamb is a 87 y.o. male  Chief Complaint  Patient presents with   Diabetes    DIABETES Hypoglycemic episodes:no Polydipsia/polyuria: no Visual disturbance: no Chest pain: no Paresthesias: no Glucose Monitoring: no  Accucheck frequency: Not Checking Taking Insulin?: no Blood Pressure Monitoring: not checking Retinal Examination: Up to Date Foot Exam:  Done today Diabetic Education: Completed Pneumovax: Up to Date Influenza: Up to Date Aspirin: yes  HYPERTENSION / HYPERLIPIDEMIA Satisfied with current treatment? yes Duration of hypertension: chronic BP monitoring frequency: not checking BP medication side effects: no Past BP meds: imdur Duration of hyperlipidemia: chronic Cholesterol medication side effects: no Cholesterol supplements: none Past cholesterol medications: zetia, atorvastatin Medication compliance: excellent compliance Aspirin: yes Recent stressors: no Recurrent headaches: no Visual changes: no Palpitations: no Dyspnea: no Chest pain: no Lower extremity edema: no Dizzy/lightheaded: no  Relevant past medical, surgical, family and social history reviewed and updated as indicated. Interim medical history since our last visit reviewed. Allergies and medications reviewed and updated.  Review of Systems  Constitutional: Negative.   Respiratory: Negative.    Cardiovascular: Negative.   Gastrointestinal: Negative.   Musculoskeletal: Negative.   Neurological: Negative.   Psychiatric/Behavioral: Negative.     Per HPI unless specifically indicated above     Objective:    BP 126/74   Pulse 63   Temp (!) 97.5 F (36.4 C)   Wt 135 lb 12.8 oz (61.6 kg)   SpO2 99%   BMI 24.06 kg/m   Wt Readings from Last 3 Encounters:   01/24/21 135 lb 12.8 oz (61.6 kg)  10/26/20 132 lb 12.8 oz (60.2 kg)  08/02/20 131 lb (59.4 kg)    Physical Exam Vitals and nursing note reviewed.  Constitutional:      General: He is not in acute distress.    Appearance: Normal appearance. He is not ill-appearing, toxic-appearing or diaphoretic.  HENT:     Head: Normocephalic and atraumatic.     Right Ear: External ear normal.     Left Ear: External ear normal.     Nose: Nose normal.     Mouth/Throat:     Mouth: Mucous membranes are moist.     Pharynx: Oropharynx is clear.  Eyes:     General: No scleral icterus.       Right eye: No discharge.        Left eye: No discharge.     Extraocular Movements: Extraocular movements intact.     Conjunctiva/sclera: Conjunctivae normal.     Pupils: Pupils are equal, round, and reactive to light.  Cardiovascular:     Rate and Rhythm: Normal rate and regular rhythm.     Pulses: Normal pulses.     Heart sounds: Normal heart sounds. No murmur heard.   No friction rub. No gallop.  Pulmonary:     Effort: Pulmonary effort is normal. No respiratory distress.     Breath sounds: Normal breath sounds. No stridor. No wheezing, rhonchi or rales.  Chest:     Chest wall: No tenderness.  Musculoskeletal:        General: Normal range of motion.     Cervical back: Normal  range of motion and neck supple.  Skin:    General: Skin is warm and dry.     Capillary Refill: Capillary refill takes less than 2 seconds.     Coloration: Skin is not jaundiced or pale.     Findings: No bruising, erythema, lesion or rash.  Neurological:     General: No focal deficit present.     Mental Status: He is alert and oriented to person, place, and time. Mental status is at baseline.  Psychiatric:        Mood and Affect: Mood normal.        Behavior: Behavior normal.        Thought Content: Thought content normal.        Judgment: Judgment normal.    Results for orders placed or performed in visit on 01/24/21   Microscopic Examination   BLD  Result Value Ref Range   WBC, UA 11-30 (A) 0 - 5 /hpf   RBC 11-30 (A) 0 - 2 /hpf   Epithelial Cells (non renal) 0-10 0 - 10 /hpf   Mucus, UA Present (A) Not Estab.   Bacteria, UA Few (A) None seen/Few  Bayer DCA Hb A1c Waived  Result Value Ref Range   HB A1C (BAYER DCA - WAIVED) 5.9 (H) 4.8 - 5.6 %  CBC with Differential/Platelet  Result Value Ref Range   WBC 13.0 (H) 3.4 - 10.8 x10E3/uL   RBC 2.98 (L) 4.14 - 5.80 x10E6/uL   Hemoglobin 9.2 (L) 13.0 - 17.7 g/dL   Hematocrit 26.9 (L) 37.5 - 51.0 %   MCV 90 79 - 97 fL   MCH 30.9 26.6 - 33.0 pg   MCHC 34.2 31.5 - 35.7 g/dL   RDW 12.0 11.6 - 15.4 %   Platelets 277 150 - 450 x10E3/uL   Neutrophils 54 Not Estab. %   Lymphs 31 Not Estab. %   Monocytes 11 Not Estab. %   Eos 2 Not Estab. %   Basos 1 Not Estab. %   Neutrophils Absolute 7.1 (H) 1.4 - 7.0 x10E3/uL   Lymphocytes Absolute 4.0 (H) 0.7 - 3.1 x10E3/uL   Monocytes Absolute 1.4 (H) 0.1 - 0.9 x10E3/uL   EOS (ABSOLUTE) 0.3 0.0 - 0.4 x10E3/uL   Basophils Absolute 0.1 0.0 - 0.2 x10E3/uL   Immature Granulocytes 1 Not Estab. %   Immature Grans (Abs) 0.1 0.0 - 0.1 x10E3/uL  Comprehensive metabolic panel  Result Value Ref Range   Glucose 105 (H) 70 - 99 mg/dL   BUN 54 (H) 8 - 27 mg/dL   Creatinine, Ser 7.42 (H) 0.76 - 1.27 mg/dL   eGFR 7 (L) >59 mL/min/1.73   BUN/Creatinine Ratio 7 (L) 10 - 24   Sodium 133 (L) 134 - 144 mmol/L   Potassium 4.0 3.5 - 5.2 mmol/L   Chloride 88 (L) 96 - 106 mmol/L   CO2 23 20 - 29 mmol/L   Calcium 8.2 (L) 8.6 - 10.2 mg/dL   Total Protein 7.1 6.0 - 8.5 g/dL   Albumin 3.0 (L) 3.6 - 4.6 g/dL   Globulin, Total 4.1 1.5 - 4.5 g/dL   Albumin/Globulin Ratio 0.7 (L) 1.2 - 2.2   Bilirubin Total 0.3 0.0 - 1.2 mg/dL   Alkaline Phosphatase 98 44 - 121 IU/L   AST 17 0 - 40 IU/L   ALT 9 0 - 44 IU/L  Lipid Panel w/o Chol/HDL Ratio  Result Value Ref Range   Cholesterol, Total 126 100 - 199 mg/dL  Triglycerides 111 0 - 149  mg/dL   HDL 28 (L) >39 mg/dL   VLDL Cholesterol Cal 21 5 - 40 mg/dL   LDL Chol Calc (NIH) 77 0 - 99 mg/dL  Microalbumin, Urine Waived  Result Value Ref Range   Microalb, Ur Waived 150 (H) 0 - 19 mg/L   Creatinine, Urine Waived 50 10 - 300 mg/dL   Microalb/Creat Ratio >300 (H) <30 mg/g  TSH  Result Value Ref Range   TSH 7.080 (H) 0.450 - 4.500 uIU/mL  Urinalysis, Routine w reflex microscopic  Result Value Ref Range   Specific Gravity, UA 1.020 1.005 - 1.030   pH, UA 5.5 5.0 - 7.5   Color, UA Yellow Yellow   Appearance Ur Cloudy (A) Clear   Leukocytes,UA 3+ (A) Negative   Protein,UA 2+ (A) Negative/Trace   Glucose, UA Negative Negative   Ketones, UA Negative Negative   RBC, UA 3+ (A) Negative   Bilirubin, UA Negative Negative   Urobilinogen, Ur 0.2 0.2 - 1.0 mg/dL   Nitrite, UA Negative Negative   Microscopic Examination See below:       Assessment & Plan:   Problem List Items Addressed This Visit       Cardiovascular and Mediastinum   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 Continue current regimen. Continue to monitor. Call with any concerns. Refills given. Labs drawn today.       Relevant Medications   atorvastatin (LIPITOR) 80 MG tablet   ezetimibe (ZETIA) 10 MG tablet   isosorbide mononitrate (IMDUR) 30 MG 24 hr tablet   Other Relevant Orders   CBC with Differential/Platelet (Completed)   Comprehensive metabolic panel (Completed)   Microalbumin, Urine Waived (Completed)   TSH (Completed)     Endocrine   Diabetes ([place]) - Primary    Doing well with a1c of 5.9. Continue current regimen. Continue to monitor. Call with any concerns.       Relevant Medications   atorvastatin (LIPITOR) 80 MG tablet   sitaGLIPtin (JANUVIA) 25 MG tablet   Other Relevant Orders   Bayer DCA Hb A1c Waived (Completed)   CBC with Differential/Platelet (Completed)   Comprehensive metabolic panel (Completed)    Microalbumin, Urine Waived (Completed)   Urinalysis, Routine w reflex microscopic (Completed)     Other   Mixed hyperlipidemia    Under good control on current regimen. Continue current regimen. Continue to monitor. Call with any concerns. Refills given. Labs drawn today.       Relevant Medications   atorvastatin (LIPITOR) 80 MG tablet   ezetimibe (ZETIA) 10 MG tablet   isosorbide mononitrate (IMDUR) 30 MG 24 hr tablet   Other Visit Diagnoses     Pure hypercholesterolemia       Relevant Medications   atorvastatin (LIPITOR) 80 MG tablet   ezetimibe (ZETIA) 10 MG tablet   isosorbide mononitrate (IMDUR) 30 MG 24 hr tablet   Other Relevant Orders   CBC with Differential/Platelet (Completed)   Comprehensive metabolic panel (Completed)   Lipid Panel w/o Chol/HDL Ratio (Completed)   Pyuria       Will check culture. Await resutls. Treat as needed.    Relevant Orders   Urine Culture        Follow up plan: Return in about 3 months (around 04/24/2021).

## 2021-01-25 ENCOUNTER — Ambulatory Visit: Payer: Medicare Other | Admitting: Family Medicine

## 2021-01-25 DIAGNOSIS — D631 Anemia in chronic kidney disease: Secondary | ICD-10-CM | POA: Diagnosis not present

## 2021-01-25 DIAGNOSIS — Z992 Dependence on renal dialysis: Secondary | ICD-10-CM | POA: Diagnosis not present

## 2021-01-25 DIAGNOSIS — D509 Iron deficiency anemia, unspecified: Secondary | ICD-10-CM | POA: Diagnosis not present

## 2021-01-25 DIAGNOSIS — N186 End stage renal disease: Secondary | ICD-10-CM | POA: Diagnosis not present

## 2021-01-25 LAB — CBC WITH DIFFERENTIAL/PLATELET
Basophils Absolute: 0.1 10*3/uL (ref 0.0–0.2)
Basos: 1 %
EOS (ABSOLUTE): 0.3 10*3/uL (ref 0.0–0.4)
Eos: 2 %
Hematocrit: 26.9 % — ABNORMAL LOW (ref 37.5–51.0)
Hemoglobin: 9.2 g/dL — ABNORMAL LOW (ref 13.0–17.7)
Immature Grans (Abs): 0.1 10*3/uL (ref 0.0–0.1)
Immature Granulocytes: 1 %
Lymphocytes Absolute: 4 10*3/uL — ABNORMAL HIGH (ref 0.7–3.1)
Lymphs: 31 %
MCH: 30.9 pg (ref 26.6–33.0)
MCHC: 34.2 g/dL (ref 31.5–35.7)
MCV: 90 fL (ref 79–97)
Monocytes Absolute: 1.4 10*3/uL — ABNORMAL HIGH (ref 0.1–0.9)
Monocytes: 11 %
Neutrophils Absolute: 7.1 10*3/uL — ABNORMAL HIGH (ref 1.4–7.0)
Neutrophils: 54 %
Platelets: 277 10*3/uL (ref 150–450)
RBC: 2.98 x10E6/uL — ABNORMAL LOW (ref 4.14–5.80)
RDW: 12 % (ref 11.6–15.4)
WBC: 13 10*3/uL — ABNORMAL HIGH (ref 3.4–10.8)

## 2021-01-25 LAB — LIPID PANEL W/O CHOL/HDL RATIO
Cholesterol, Total: 126 mg/dL (ref 100–199)
HDL: 28 mg/dL — ABNORMAL LOW (ref >39)
LDL Chol Calc (NIH): 77 mg/dL (ref 0–99)
Triglycerides: 111 mg/dL (ref 0–149)
VLDL Cholesterol Cal: 21 mg/dL (ref 5–40)

## 2021-01-25 LAB — COMPREHENSIVE METABOLIC PANEL
ALT: 9 IU/L (ref 0–44)
AST: 17 IU/L (ref 0–40)
Albumin/Globulin Ratio: 0.7 — ABNORMAL LOW (ref 1.2–2.2)
Albumin: 3 g/dL — ABNORMAL LOW (ref 3.6–4.6)
Alkaline Phosphatase: 98 IU/L (ref 44–121)
BUN/Creatinine Ratio: 7 — ABNORMAL LOW (ref 10–24)
BUN: 54 mg/dL — ABNORMAL HIGH (ref 8–27)
Bilirubin Total: 0.3 mg/dL (ref 0.0–1.2)
CO2: 23 mmol/L (ref 20–29)
Calcium: 8.2 mg/dL — ABNORMAL LOW (ref 8.6–10.2)
Chloride: 88 mmol/L — ABNORMAL LOW (ref 96–106)
Creatinine, Ser: 7.42 mg/dL — ABNORMAL HIGH (ref 0.76–1.27)
Globulin, Total: 4.1 g/dL (ref 1.5–4.5)
Glucose: 105 mg/dL — ABNORMAL HIGH (ref 70–99)
Potassium: 4 mmol/L (ref 3.5–5.2)
Sodium: 133 mmol/L — ABNORMAL LOW (ref 134–144)
Total Protein: 7.1 g/dL (ref 6.0–8.5)
eGFR: 7 mL/min/{1.73_m2} — ABNORMAL LOW (ref >59)

## 2021-01-25 LAB — URINALYSIS, ROUTINE W REFLEX MICROSCOPIC
Bilirubin, UA: NEGATIVE
Glucose, UA: NEGATIVE
Ketones, UA: NEGATIVE
Nitrite, UA: NEGATIVE
Specific Gravity, UA: 1.02 (ref 1.005–1.030)
Urobilinogen, Ur: 0.2 mg/dL (ref 0.2–1.0)
pH, UA: 5.5 (ref 5.0–7.5)

## 2021-01-25 LAB — MICROALBUMIN, URINE WAIVED
Creatinine, Urine Waived: 50 mg/dL (ref 10–300)
Microalb, Ur Waived: 150 mg/L — ABNORMAL HIGH (ref 0–19)
Microalb/Creat Ratio: >300 mg/g — ABNORMAL HIGH (ref <30)

## 2021-01-25 LAB — TSH: TSH: 7.08 u[IU]/mL — ABNORMAL HIGH (ref 0.450–4.500)

## 2021-01-25 LAB — BAYER DCA HB A1C WAIVED: HB A1C (BAYER DCA - WAIVED): 5.9 % — ABNORMAL HIGH (ref 4.8–5.6)

## 2021-01-25 LAB — MICROSCOPIC EXAMINATION

## 2021-01-25 NOTE — Assessment & Plan Note (Signed)
Under good control on current regimen. Continue current regimen. Continue to monitor. Call with any concerns. Refills given. Labs drawn today.   

## 2021-01-25 NOTE — Assessment & Plan Note (Signed)
Doing well with a1c of 5.9. Continue current regimen. Continue to monitor. Call with any concerns.  

## 2021-01-26 DIAGNOSIS — Z992 Dependence on renal dialysis: Secondary | ICD-10-CM | POA: Diagnosis not present

## 2021-01-26 DIAGNOSIS — N186 End stage renal disease: Secondary | ICD-10-CM | POA: Diagnosis not present

## 2021-01-26 DIAGNOSIS — D631 Anemia in chronic kidney disease: Secondary | ICD-10-CM | POA: Diagnosis not present

## 2021-01-26 DIAGNOSIS — D509 Iron deficiency anemia, unspecified: Secondary | ICD-10-CM | POA: Diagnosis not present

## 2021-01-27 LAB — URINE CULTURE: Organism ID, Bacteria: NO GROWTH

## 2021-01-28 ENCOUNTER — Other Ambulatory Visit: Payer: Self-pay | Admitting: Family Medicine

## 2021-01-28 DIAGNOSIS — R7989 Other specified abnormal findings of blood chemistry: Secondary | ICD-10-CM

## 2021-01-28 DIAGNOSIS — D649 Anemia, unspecified: Secondary | ICD-10-CM

## 2021-01-28 DIAGNOSIS — E039 Hypothyroidism, unspecified: Secondary | ICD-10-CM

## 2021-01-31 DIAGNOSIS — N186 End stage renal disease: Secondary | ICD-10-CM | POA: Diagnosis not present

## 2021-01-31 DIAGNOSIS — D509 Iron deficiency anemia, unspecified: Secondary | ICD-10-CM | POA: Diagnosis not present

## 2021-01-31 DIAGNOSIS — Z992 Dependence on renal dialysis: Secondary | ICD-10-CM | POA: Diagnosis not present

## 2021-01-31 DIAGNOSIS — D631 Anemia in chronic kidney disease: Secondary | ICD-10-CM | POA: Diagnosis not present

## 2021-02-01 DIAGNOSIS — Z992 Dependence on renal dialysis: Secondary | ICD-10-CM | POA: Diagnosis not present

## 2021-02-01 DIAGNOSIS — D509 Iron deficiency anemia, unspecified: Secondary | ICD-10-CM | POA: Diagnosis not present

## 2021-02-01 DIAGNOSIS — N186 End stage renal disease: Secondary | ICD-10-CM | POA: Diagnosis not present

## 2021-02-01 DIAGNOSIS — D631 Anemia in chronic kidney disease: Secondary | ICD-10-CM | POA: Diagnosis not present

## 2021-02-02 DIAGNOSIS — Z992 Dependence on renal dialysis: Secondary | ICD-10-CM | POA: Diagnosis not present

## 2021-02-02 DIAGNOSIS — N186 End stage renal disease: Secondary | ICD-10-CM | POA: Diagnosis not present

## 2021-02-02 DIAGNOSIS — D631 Anemia in chronic kidney disease: Secondary | ICD-10-CM | POA: Diagnosis not present

## 2021-02-02 DIAGNOSIS — D509 Iron deficiency anemia, unspecified: Secondary | ICD-10-CM | POA: Diagnosis not present

## 2021-02-03 DIAGNOSIS — Z992 Dependence on renal dialysis: Secondary | ICD-10-CM | POA: Diagnosis not present

## 2021-02-03 DIAGNOSIS — D509 Iron deficiency anemia, unspecified: Secondary | ICD-10-CM | POA: Diagnosis not present

## 2021-02-03 DIAGNOSIS — D631 Anemia in chronic kidney disease: Secondary | ICD-10-CM | POA: Diagnosis not present

## 2021-02-03 DIAGNOSIS — N186 End stage renal disease: Secondary | ICD-10-CM | POA: Diagnosis not present

## 2021-02-04 DIAGNOSIS — Z992 Dependence on renal dialysis: Secondary | ICD-10-CM | POA: Diagnosis not present

## 2021-02-04 DIAGNOSIS — D631 Anemia in chronic kidney disease: Secondary | ICD-10-CM | POA: Diagnosis not present

## 2021-02-04 DIAGNOSIS — D509 Iron deficiency anemia, unspecified: Secondary | ICD-10-CM | POA: Diagnosis not present

## 2021-02-04 DIAGNOSIS — N186 End stage renal disease: Secondary | ICD-10-CM | POA: Diagnosis not present

## 2021-02-05 DIAGNOSIS — N186 End stage renal disease: Secondary | ICD-10-CM | POA: Diagnosis not present

## 2021-02-05 DIAGNOSIS — Z992 Dependence on renal dialysis: Secondary | ICD-10-CM | POA: Diagnosis not present

## 2021-02-05 DIAGNOSIS — D631 Anemia in chronic kidney disease: Secondary | ICD-10-CM | POA: Diagnosis not present

## 2021-02-05 DIAGNOSIS — D509 Iron deficiency anemia, unspecified: Secondary | ICD-10-CM | POA: Diagnosis not present

## 2021-02-06 DIAGNOSIS — Z992 Dependence on renal dialysis: Secondary | ICD-10-CM | POA: Diagnosis not present

## 2021-02-06 DIAGNOSIS — D509 Iron deficiency anemia, unspecified: Secondary | ICD-10-CM | POA: Diagnosis not present

## 2021-02-06 DIAGNOSIS — N186 End stage renal disease: Secondary | ICD-10-CM | POA: Diagnosis not present

## 2021-02-06 DIAGNOSIS — D631 Anemia in chronic kidney disease: Secondary | ICD-10-CM | POA: Diagnosis not present

## 2021-02-07 DIAGNOSIS — N186 End stage renal disease: Secondary | ICD-10-CM | POA: Diagnosis not present

## 2021-02-07 DIAGNOSIS — Z992 Dependence on renal dialysis: Secondary | ICD-10-CM | POA: Diagnosis not present

## 2021-02-07 DIAGNOSIS — D631 Anemia in chronic kidney disease: Secondary | ICD-10-CM | POA: Diagnosis not present

## 2021-02-07 DIAGNOSIS — D509 Iron deficiency anemia, unspecified: Secondary | ICD-10-CM | POA: Diagnosis not present

## 2021-02-08 DIAGNOSIS — N186 End stage renal disease: Secondary | ICD-10-CM | POA: Diagnosis not present

## 2021-02-08 DIAGNOSIS — D631 Anemia in chronic kidney disease: Secondary | ICD-10-CM | POA: Diagnosis not present

## 2021-02-08 DIAGNOSIS — Z992 Dependence on renal dialysis: Secondary | ICD-10-CM | POA: Diagnosis not present

## 2021-02-08 DIAGNOSIS — D509 Iron deficiency anemia, unspecified: Secondary | ICD-10-CM | POA: Diagnosis not present

## 2021-02-09 DIAGNOSIS — N186 End stage renal disease: Secondary | ICD-10-CM | POA: Diagnosis not present

## 2021-02-09 DIAGNOSIS — Z992 Dependence on renal dialysis: Secondary | ICD-10-CM | POA: Diagnosis not present

## 2021-02-09 DIAGNOSIS — D631 Anemia in chronic kidney disease: Secondary | ICD-10-CM | POA: Diagnosis not present

## 2021-02-09 DIAGNOSIS — D509 Iron deficiency anemia, unspecified: Secondary | ICD-10-CM | POA: Diagnosis not present

## 2021-02-10 ENCOUNTER — Ambulatory Visit: Payer: Self-pay | Admitting: *Deleted

## 2021-02-10 DIAGNOSIS — D509 Iron deficiency anemia, unspecified: Secondary | ICD-10-CM | POA: Diagnosis not present

## 2021-02-10 DIAGNOSIS — N186 End stage renal disease: Secondary | ICD-10-CM | POA: Diagnosis not present

## 2021-02-10 DIAGNOSIS — D631 Anemia in chronic kidney disease: Secondary | ICD-10-CM | POA: Diagnosis not present

## 2021-02-10 DIAGNOSIS — Z992 Dependence on renal dialysis: Secondary | ICD-10-CM | POA: Diagnosis not present

## 2021-02-10 NOTE — Telephone Encounter (Signed)
Pt stated he has a cough and some congestion and he would like to ask that a nurse return his call. Cb# [phone]        Chief Complaint: Cough, runny nose Symptoms: cough, clear mucous, runny nose at times, clear Frequency: yesterday Pertinent Negatives: Patient denies fever, SOB, CP,sinus pain/pressure,wheezing Disposition: [] ED /[] Urgent Care (no appt availability in office) / [] Appointment(In office/virtual)/ []  [facility]/  *Home Care/ [] Refused Recommended Disposition  Additional Notes:  HOme care advise given, verbalizes understanding. States is taking Coricidin OTC "Really helping break it up." States "Just wanted to know if I should be taking anything else." Assured NT would route  to practice for PCPs review. Advised covid home test, states will do so.    Reason for Disposition  Cough with cold symptoms (e.g., runny nose, postnasal drip, throat clearing)  Answer Assessment - Initial Assessment Questions 1. ONSET: "When did the cough begin?"      yesterday 2. SEVERITY: "How bad is the cough today?"      moderate 3. SPUTUM: "Describe the color of your sputum" (none, dry cough; clear, white, yellow, green)     clear 4. HEMOPTYSIS: "Are you coughing up any blood?" If so ask: "How much?" (flecks, streaks, tablespoons, etc.)     no 5. DIFFICULTY BREATHING: "Are you having difficulty breathing?" If Yes, ask: "How bad is it?" (e.g., mild, moderate, severe)    - MILD: No SOB at rest, mild SOB with walking, speaks normally in sentences, can lie down, no retractions, pulse < 100.    - MODERATE: SOB at rest, SOB with minimal exertion and prefers to sit, cannot lie down flat, speaks in phrases, mild retractions, audible wheezing, pulse 100-120.    - SEVERE: Very SOB at rest, speaks in single words, struggling to breathe, sitting hunched forward, retractions, pulse > 120      No 6. FEVER: "Do you have a fever?" If Yes, ask: "What is your temperature, how was it  measured, and when did it start?"     no 7. CARDIAC HISTORY: "Do you have any history of heart disease?" (e.g., heart attack, congestive heart failure)       8. LUNG HISTORY: "Do you have any history of lung disease?"  (e.g., pulmonary embolus, asthma, emphysema)      9. PE RISK FACTORS: "Do you have a history of blood clots?" (or: recent major surgery, recent prolonged travel, bedridden)      10. OTHER SYMPTOMS: "Do you have any other symptoms?" (e.g., runny nose, wheezing, chest pain)       Runny nose "sinus congestion"  No facial pain  Protocols used: Cough - Acute Non-Productive-A-AH

## 2021-02-11 ENCOUNTER — Encounter: Payer: Self-pay | Admitting: Family Medicine

## 2021-02-11 ENCOUNTER — Telehealth (INDEPENDENT_AMBULATORY_CARE_PROVIDER_SITE_OTHER): Payer: Medicare Other | Admitting: Family Medicine

## 2021-02-11 VITALS — BP 102/46 | HR 65 | Temp 98.1°F | Wt 134.0 lb

## 2021-02-11 DIAGNOSIS — N186 End stage renal disease: Secondary | ICD-10-CM | POA: Diagnosis not present

## 2021-02-11 DIAGNOSIS — U071 COVID-19: Secondary | ICD-10-CM | POA: Diagnosis not present

## 2021-02-11 DIAGNOSIS — Z992 Dependence on renal dialysis: Secondary | ICD-10-CM | POA: Diagnosis not present

## 2021-02-11 DIAGNOSIS — D509 Iron deficiency anemia, unspecified: Secondary | ICD-10-CM | POA: Diagnosis not present

## 2021-02-11 DIAGNOSIS — D631 Anemia in chronic kidney disease: Secondary | ICD-10-CM | POA: Diagnosis not present

## 2021-02-11 MED ORDER — PREDNISONE 20 MG PO TABS: 20.0000 mg | ORAL_TABLET | Freq: Every day | ORAL | 0 refills | Status: DC

## 2021-02-11 MED ORDER — MOLNUPIRAVIR EUA 200MG CAPSULE: 4.0000 | ORAL_CAPSULE | Freq: Two times a day (BID) | ORAL | 0 refills | Status: AC

## 2021-02-11 MED ORDER — BENZONATATE 200 MG PO CAPS: 200.0000 mg | ORAL_CAPSULE | Freq: Two times a day (BID) | ORAL | 0 refills | Status: DC | PRN

## 2021-02-11 NOTE — Telephone Encounter (Signed)
I can see him virtually if he would like. Ok to double book at [employer]

## 2021-02-11 NOTE — Progress Notes (Signed)
BP (!) 102/46    Pulse 65    Temp 98.1 F (36.7 C)    Wt 134 lb (60.8 kg)    BMI 23.74 kg/m    Subjective:    Patient ID: Ray Lamb, male    DOB: 1931/01/12, 87 y.o.   MRN: [id number]  HPI: CARLOS HEBER is a 87 y.o. male  Chief Complaint  Patient presents with   Cough    Patient has cough and congestion for about 3 days. Patient states he took a COVID test today and it was positive.    UPPER RESPIRATORY TRACT INFECTION Duration: 3 days Worst symptom: cough Fever: no Cough: yes Shortness of breath: yes Wheezing: yes Chest pain: no Chest tightness: yes Chest congestion: yes Nasal congestion: yes Runny nose: yes Post nasal drip: yes Sneezing: no Sore throat: yes Swollen glands: no Sinus pressure: no Headache: yes Face pain: no Toothache: no Ear pain: no  Ear pressure: no  Eyes red/itching:no Eye drainage/crusting: no  Vomiting: no Rash: no Fatigue: yes Sick contacts: yes Strep contacts: no  Context: worse Recurrent sinusitis: no Relief with OTC cold/cough medications: no  Treatments attempted: coricidin   Relevant past medical, surgical, family and social history reviewed and updated as indicated. Interim medical history since our last visit reviewed. Allergies and medications reviewed and updated.  Review of Systems  Constitutional:  Positive for chills, diaphoresis and fatigue. Negative for activity change, appetite change, fever and unexpected weight change.  HENT:  Positive for congestion, postnasal drip, rhinorrhea, sinus pressure and sinus pain. Negative for dental problem, drooling, ear discharge, ear pain, facial swelling, hearing loss, mouth sores, nosebleeds, sneezing, sore throat, tinnitus, trouble swallowing and voice change.   Eyes: Negative.   Respiratory:  Positive for cough, chest tightness, shortness of breath and wheezing. Negative for apnea, choking and stridor.   Cardiovascular: Negative.   Gastrointestinal: Negative.   Musculoskeletal:  Negative.   Neurological: Negative.   Psychiatric/Behavioral: Negative.     Per HPI unless specifically indicated above     Objective:    BP (!) 102/46    Pulse 65    Temp 98.1 F (36.7 C)    Wt 134 lb (60.8 kg)    BMI 23.74 kg/m   Wt Readings from Last 3 Encounters:  02/11/21 134 lb (60.8 kg)  01/24/21 135 lb 12.8 oz (61.6 kg)  10/26/20 132 lb 12.8 oz (60.2 kg)    Physical Exam Vitals and nursing note reviewed.  Pulmonary:     Effort: Pulmonary effort is normal. No respiratory distress.     Comments: Speaking in full sentences Neurological:     Mental Status: He is alert.  Psychiatric:        Mood and Affect: Mood normal.        Behavior: Behavior normal.        Thought Content: Thought content normal.        Judgment: Judgment normal.    Results for orders placed or performed in visit on 01/24/21  Urine Culture   Specimen: Urine   UR  Result Value Ref Range   Urine Culture, Routine Final report    Organism ID, Bacteria No growth   Microscopic Examination   BLD  Result Value Ref Range   WBC, UA 11-30 (A) 0 - 5 /hpf   RBC 11-30 (A) 0 - 2 /hpf   Epithelial Cells (non renal) 0-10 0 - 10 /hpf   Mucus, UA Present (A) Not Estab.  Bacteria, UA Few (A) None seen/Few  Bayer DCA Hb A1c Waived  Result Value Ref Range   HB A1C (BAYER DCA - WAIVED) 5.9 (H) 4.8 - 5.6 %  CBC with Differential/Platelet  Result Value Ref Range   WBC 13.0 (H) 3.4 - 10.8 x10E3/uL   RBC 2.98 (L) 4.14 - 5.80 x10E6/uL   Hemoglobin 9.2 (L) 13.0 - 17.7 g/dL   Hematocrit 26.9 (L) 37.5 - 51.0 %   MCV 90 79 - 97 fL   MCH 30.9 26.6 - 33.0 pg   MCHC 34.2 31.5 - 35.7 g/dL   RDW 12.0 11.6 - 15.4 %   Platelets 277 150 - 450 x10E3/uL   Neutrophils 54 Not Estab. %   Lymphs 31 Not Estab. %   Monocytes 11 Not Estab. %   Eos 2 Not Estab. %   Basos 1 Not Estab. %   Neutrophils Absolute 7.1 (H) 1.4 - 7.0 x10E3/uL   Lymphocytes Absolute 4.0 (H) 0.7 - 3.1 x10E3/uL   Monocytes Absolute 1.4 (H) 0.1 - 0.9  x10E3/uL   EOS (ABSOLUTE) 0.3 0.0 - 0.4 x10E3/uL   Basophils Absolute 0.1 0.0 - 0.2 x10E3/uL   Immature Granulocytes 1 Not Estab. %   Immature Grans (Abs) 0.1 0.0 - 0.1 x10E3/uL  Comprehensive metabolic panel  Result Value Ref Range   Glucose 105 (H) 70 - 99 mg/dL   BUN 54 (H) 8 - 27 mg/dL   Creatinine, Ser 7.42 (H) 0.76 - 1.27 mg/dL   eGFR 7 (L) >59 mL/min/1.73   BUN/Creatinine Ratio 7 (L) 10 - 24   Sodium 133 (L) 134 - 144 mmol/L   Potassium 4.0 3.5 - 5.2 mmol/L   Chloride 88 (L) 96 - 106 mmol/L   CO2 23 20 - 29 mmol/L   Calcium 8.2 (L) 8.6 - 10.2 mg/dL   Total Protein 7.1 6.0 - 8.5 g/dL   Albumin 3.0 (L) 3.6 - 4.6 g/dL   Globulin, Total 4.1 1.5 - 4.5 g/dL   Albumin/Globulin Ratio 0.7 (L) 1.2 - 2.2   Bilirubin Total 0.3 0.0 - 1.2 mg/dL   Alkaline Phosphatase 98 44 - 121 IU/L   AST 17 0 - 40 IU/L   ALT 9 0 - 44 IU/L  Lipid Panel w/o Chol/HDL Ratio  Result Value Ref Range   Cholesterol, Total 126 100 - 199 mg/dL   Triglycerides 111 0 - 149 mg/dL   HDL 28 (L) >39 mg/dL   VLDL Cholesterol Cal 21 5 - 40 mg/dL   LDL Chol Calc (NIH) 77 0 - 99 mg/dL  Microalbumin, Urine Waived  Result Value Ref Range   Microalb, Ur Waived 150 (H) 0 - 19 mg/L   Creatinine, Urine Waived 50 10 - 300 mg/dL   Microalb/Creat Ratio >300 (H) <30 mg/g  TSH  Result Value Ref Range   TSH 7.080 (H) 0.450 - 4.500 uIU/mL  Urinalysis, Routine w reflex microscopic  Result Value Ref Range   Specific Gravity, UA 1.020 1.005 - 1.030   pH, UA 5.5 5.0 - 7.5   Color, UA Yellow Yellow   Appearance Ur Cloudy (A) Clear   Leukocytes,UA 3+ (A) Negative   Protein,UA 2+ (A) Negative/Trace   Glucose, UA Negative Negative   Ketones, UA Negative Negative   RBC, UA 3+ (A) Negative   Bilirubin, UA Negative Negative   Urobilinogen, Ur 0.2 0.2 - 1.0 mg/dL   Nitrite, UA Negative Negative   Microscopic Examination See below:       Assessment &  Plan:   Problem List Items Addressed This Visit   None Visit Diagnoses      COVID-19    -  Primary   Will treat with molnipirovir, prednisone and tessalon perles. Recheck 5 days. Call with any concerns.    Relevant Medications   molnupiravir EUA (LAGEVRIO) 200 mg CAPS capsule        Follow up plan: Return in about 5 days (around 02/16/2021).   This visit was completed via telephone due to the restrictions of the COVID-19 pandemic. All issues as above were discussed and addressed but no physical exam was performed. If it was felt that the patient should be evaluated in the office, they were directed there. The patient verbally consented to this visit. Patient was unable to complete an audio/visual visit due to Lack of equipment. Due to the catastrophic nature of the COVID-19 pandemic, this visit was done through audio contact only. Location of the patient: home Location of the provider: work Those involved with this call:  Provider: Park Liter, DO CMA: Louanna Raw, CMA Front Desk/Registration: [employer]  Time spent on call:  21 minutes on the phone discussing health concerns. 30 minutes total spent in review of patient's record and preparation of their chart.

## 2021-02-11 NOTE — Telephone Encounter (Signed)
Pt is scheduled for virtual visit at 4:20 pm

## 2021-02-12 DIAGNOSIS — Z992 Dependence on renal dialysis: Secondary | ICD-10-CM | POA: Diagnosis not present

## 2021-02-12 DIAGNOSIS — N186 End stage renal disease: Secondary | ICD-10-CM | POA: Diagnosis not present

## 2021-02-12 DIAGNOSIS — D509 Iron deficiency anemia, unspecified: Secondary | ICD-10-CM | POA: Diagnosis not present

## 2021-02-12 DIAGNOSIS — D631 Anemia in chronic kidney disease: Secondary | ICD-10-CM | POA: Diagnosis not present

## 2021-02-15 ENCOUNTER — Telehealth: Payer: Self-pay | Admitting: *Deleted

## 2021-02-15 NOTE — Telephone Encounter (Signed)
Pt is scheduled for Thursday at 3:20

## 2021-02-15 NOTE — Telephone Encounter (Signed)
Please advise 

## 2021-02-15 NOTE — Progress Notes (Signed)
I called the patient to schedule an appointment this week and he answered but he had a coughing spell. The call got disconnected and I attempted to call him back. I had to leave a message for the patient to call back to schedule an appointment this week with Dr. Wynetta Emery.

## 2021-02-15 NOTE — Telephone Encounter (Signed)
Patient is calling back- he reports he is doing well with the medications. Patient is on day 4 and his symptoms are better. Patient reports he feels like he has improved, vital signs are good, no fever. Patient is inquiring about help in the home for household chores ( 2-3 days/wk)- patient's wife has COVID too and they have not been able to rely on their support base- they have COVID too. Patient was told to follow up - does he need appointment - virtual?

## 2021-02-15 NOTE — Telephone Encounter (Signed)
Unfortunately I don't think I can help with household chores. I can help with care for them, but not with the chores. I would like a virtual follow up on Wed/Thurs this week

## 2021-02-17 ENCOUNTER — Encounter: Payer: Self-pay | Admitting: Family Medicine

## 2021-02-17 ENCOUNTER — Telehealth (INDEPENDENT_AMBULATORY_CARE_PROVIDER_SITE_OTHER): Payer: Medicare Other | Admitting: Family Medicine

## 2021-02-17 ENCOUNTER — Telehealth: Payer: Self-pay

## 2021-02-17 VITALS — BP 129/74 | HR 75 | Temp 97.9°F | Wt 128.7 lb

## 2021-02-17 DIAGNOSIS — U071 COVID-19: Secondary | ICD-10-CM | POA: Diagnosis not present

## 2021-02-17 MED ORDER — BENZONATATE 200 MG PO CAPS: 200.0000 mg | ORAL_CAPSULE | Freq: Two times a day (BID) | ORAL | 0 refills | Status: AC | PRN

## 2021-02-17 NOTE — Progress Notes (Signed)
BP 129/74    Pulse 75    Temp 97.9 F (36.6 C)    Wt 128 lb 11.2 oz (58.4 kg)    BMI 22.80 kg/m    Subjective:    Patient ID: Ray Lamb, male    DOB: [birth year], 88 y.o.   MRN: [id number]  HPI: Ray Lamb is a 88 y.o. male  Chief Complaint  Patient presents with   Covid Positive    Follow up, patient states he has completed medication for COVID. Patient states he is still coughing and coughing up a lot of phlegm.    UPPER RESPIRATORY TRACT INFECTION Duration: 8 days Worst symptom: cough Fever: no Cough: yes Shortness of breath: no Wheezing: no Chest pain: no Chest tightness: no Chest congestion: no Nasal congestion: no Runny nose: no Post nasal drip: no Sneezing: no Sore throat: no Swollen glands: no Sinus pressure: no Headache: yes Face pain: no Toothache: no Ear pain: no  Ear pressure: no  Eyes red/itching:no Eye drainage/crusting: no  Vomiting: no Rash: no Fatigue: yes Sick contacts: yes Strep contacts: no  Context: better Recurrent sinusitis: no Relief with OTC cold/cough medications: no  Treatments attempted: antiviral, cough medicine   Relevant past medical, surgical, family and social history reviewed and updated as indicated. Interim medical history since our last visit reviewed. Allergies and medications reviewed and updated.  Review of Systems  Constitutional: Negative.   HENT: Negative.    Eyes: Negative.   Respiratory:  Positive for cough. Negative for apnea, choking, chest tightness, shortness of breath, wheezing and stridor.   Cardiovascular: Negative.   Psychiatric/Behavioral: Negative.     Per HPI unless specifically indicated above     Objective:    BP 129/74    Pulse 75    Temp 97.9 F (36.6 C)    Wt 128 lb 11.2 oz (58.4 kg)    BMI 22.80 kg/m   Wt Readings from Last 3 Encounters:  02/17/21 128 lb 11.2 oz (58.4 kg)  02/11/21 134 lb (60.8 kg)  01/24/21 135 lb 12.8 oz (61.6 kg)    Physical Exam Vitals and nursing note  reviewed.  Pulmonary:     Effort: Pulmonary effort is normal. No respiratory distress.     Comments: Speaking in full sentences Neurological:     Mental Status: He is alert.  Psychiatric:        Mood and Affect: Mood normal.        Behavior: Behavior normal.        Thought Content: Thought content normal.        Judgment: Judgment normal.    Results for orders placed or performed in visit on 01/24/21  Urine Culture   Specimen: Urine   UR  Result Value Ref Range   Urine Culture, Routine Final report    Organism ID, Bacteria No growth   Microscopic Examination   BLD  Result Value Ref Range   WBC, UA 11-30 (A) 0 - 5 /hpf   RBC 11-30 (A) 0 - 2 /hpf   Epithelial Cells (non renal) 0-10 0 - 10 /hpf   Mucus, UA Present (A) Not Estab.   Bacteria, UA Few (A) None seen/Few  Bayer DCA Hb A1c Waived  Result Value Ref Range   HB A1C (BAYER DCA - WAIVED) 5.9 (H) 4.8 - 5.6 %  CBC with Differential/Platelet  Result Value Ref Range   WBC 13.0 (H) 3.4 - 10.8 x10E3/uL   RBC 2.98 (L) 4.14 - 5.80  x10E6/uL   Hemoglobin 9.2 (L) 13.0 - 17.7 g/dL   Hematocrit 26.9 (L) 37.5 - 51.0 %   MCV 90 79 - 97 fL   MCH 30.9 26.6 - 33.0 pg   MCHC 34.2 31.5 - 35.7 g/dL   RDW 12.0 11.6 - 15.4 %   Platelets 277 150 - 450 x10E3/uL   Neutrophils 54 Not Estab. %   Lymphs 31 Not Estab. %   Monocytes 11 Not Estab. %   Eos 2 Not Estab. %   Basos 1 Not Estab. %   Neutrophils Absolute 7.1 (H) 1.4 - 7.0 x10E3/uL   Lymphocytes Absolute 4.0 (H) 0.7 - 3.1 x10E3/uL   Monocytes Absolute 1.4 (H) 0.1 - 0.9 x10E3/uL   EOS (ABSOLUTE) 0.3 0.0 - 0.4 x10E3/uL   Basophils Absolute 0.1 0.0 - 0.2 x10E3/uL   Immature Granulocytes 1 Not Estab. %   Immature Grans (Abs) 0.1 0.0 - 0.1 x10E3/uL  Comprehensive metabolic panel  Result Value Ref Range   Glucose 105 (H) 70 - 99 mg/dL   BUN 54 (H) 8 - 27 mg/dL   Creatinine, Ser 7.42 (H) 0.76 - 1.27 mg/dL   eGFR 7 (L) >59 mL/min/1.73   BUN/Creatinine Ratio 7 (L) 10 - 24   Sodium 133  (L) 134 - 144 mmol/L   Potassium 4.0 3.5 - 5.2 mmol/L   Chloride 88 (L) 96 - 106 mmol/L   CO2 23 20 - 29 mmol/L   Calcium 8.2 (L) 8.6 - 10.2 mg/dL   Total Protein 7.1 6.0 - 8.5 g/dL   Albumin 3.0 (L) 3.6 - 4.6 g/dL   Globulin, Total 4.1 1.5 - 4.5 g/dL   Albumin/Globulin Ratio 0.7 (L) 1.2 - 2.2   Bilirubin Total 0.3 0.0 - 1.2 mg/dL   Alkaline Phosphatase 98 44 - 121 IU/L   AST 17 0 - 40 IU/L   ALT 9 0 - 44 IU/L  Lipid Panel w/o Chol/HDL Ratio  Result Value Ref Range   Cholesterol, Total 126 100 - 199 mg/dL   Triglycerides 111 0 - 149 mg/dL   HDL 28 (L) >39 mg/dL   VLDL Cholesterol Cal 21 5 - 40 mg/dL   LDL Chol Calc (NIH) 77 0 - 99 mg/dL  Microalbumin, Urine Waived  Result Value Ref Range   Microalb, Ur Waived 150 (H) 0 - 19 mg/L   Creatinine, Urine Waived 50 10 - 300 mg/dL   Microalb/Creat Ratio >300 (H) <30 mg/g  TSH  Result Value Ref Range   TSH 7.080 (H) 0.450 - 4.500 uIU/mL  Urinalysis, Routine w reflex microscopic  Result Value Ref Range   Specific Gravity, UA 1.020 1.005 - 1.030   pH, UA 5.5 5.0 - 7.5   Color, UA Yellow Yellow   Appearance Ur Cloudy (A) Clear   Leukocytes,UA 3+ (A) Negative   Protein,UA 2+ (A) Negative/Trace   Glucose, UA Negative Negative   Ketones, UA Negative Negative   RBC, UA 3+ (A) Negative   Bilirubin, UA Negative Negative   Urobilinogen, Ur 0.2 0.2 - 1.0 mg/dL   Nitrite, UA Negative Negative   Microscopic Examination See below:       Assessment & Plan:   Problem List Items Addressed This Visit   None Visit Diagnoses     COVID-19    -  Primary   Improving. Continues with cough. Continue tessalon perles. Call with any concerns.         Follow up plan: Return as scheduled.    This  visit was completed via telephone due to the restrictions of the COVID-19 pandemic. All issues as above were discussed and addressed but no physical exam was performed. If it was felt that the patient should be evaluated in the office, they were  directed there. The patient verbally consented to this visit. Patient was unable to complete an audio/visual visit due to Lack of equipment. Due to the catastrophic nature of the COVID-19 pandemic, this visit was done through audio contact only. Location of the patient: home Location of the provider: work Those involved with this call:  Provider: Park Liter, DO CMA: Louanna Raw, CMA Front Desk/Registration: [employer]  Time spent on call:  15 minutes on the phone discussing health concerns. 20 minutes total spent in review of patient's record and preparation of their chart.

## 2021-02-17 NOTE — Chronic Care Management (AMB) (Signed)
Chronic Care Management Pharmacy Assistant   Name: GRACESON NICHELSON  MRN: [id number] DOB: 25-Sep-1930  Reason for Encounter: Disease State Diabetes Mellitus   Recent office visits:  02/18/20-Megan Annia Friendly, DO (Video visit) Seen for Upper respiratory tract infection. Continue with medication. Return as scheduled. 02/11/21-Megan Annia Friendly, DO (Video visit) Seen for a cough. Start on molnupiravir EUA (LAGEVRIO) 200 mg CAPS. Follow up in 5 days. 01/24/21-Megan Annia Friendly, DO (PCP) Seen for a diabetic follow up visit. Labs ordered. Follow up in 3 months. 10/26/20-McElwee A. Lauren, NP. General follow up visit. Labs ordered. Flu vaccine given. Follow up in 3 months.  Recent consult visits:  09/29/20-Hemand Leeanne Deed, MD (Neurology) Seen for memory loss. Follow up in 6 months. 08/31/20-Todd Orlan Leavens (Podiatry) Notes not available.  Hospital visits:  None in previous 6 months  Medications: Outpatient Encounter Medications as of 02/17/2021  Medication Sig Note   ASPIRIN 81 PO Take by mouth every other day.     atorvastatin (LIPITOR) 80 MG tablet Take 1 tablet (80 mg total) by mouth at bedtime.    benzonatate (TESSALON) 200 MG capsule Take 1 capsule (200 mg total) by mouth 2 (two) times daily as needed for cough.    carvedilol (COREG) 6.25 MG tablet Take 6.25 mg by mouth 2 (two) times daily.    clopidogrel (PLAVIX) 75 MG tablet Take 1 tablet (75 mg total) by mouth daily.    ezetimibe (ZETIA) 10 MG tablet Take 1 tablet (10 mg total) by mouth daily.    isosorbide mononitrate (IMDUR) 30 MG 24 hr tablet Take 1.5 tablets (45 mg total) by mouth daily.    Lancets (ONETOUCH ULTRASOFT) lancets USE TO CHECK BLOOD SUGAR TWICE A DAY    levocetirizine (XYZAL) 5 MG tablet Take 1 tablet (5 mg total) by mouth every evening.    Melatonin 10 MG TABS Take 10 mg by mouth at bedtime.    midodrine (PROAMATINE) 5 MG tablet TAKE 1.0 TABLET ORAL THREE TIMES A DAY WITH MEAL    mirtazapine (REMERON) 15 MG  tablet Take 15 mg by mouth daily.    Multiple Vitamins-Minerals (CENTRUM SILVER ULTRA MENS PO) Take by mouth daily.    nystatin (MYCOSTATIN/NYSTOP) powder APPLY 1 APPLICATION TOPICALLY 3 (THREE) TIMES DAILY.    ondansetron (ZOFRAN) 4 MG tablet Take 4 mg by mouth every 8 (eight) hours as needed.    sitaGLIPtin (JANUVIA) 25 MG tablet Take 1 tablet (25 mg total) by mouth daily.    timolol (TIMOPTIC) 0.5 % ophthalmic solution Place 1 drop into both eyes daily.     torsemide (DEMADEX) 20 MG tablet Take 40 mg by mouth daily. 06/25/2020: Take 40 by mouth BID    TURMERIC PO Take 1,300 mg by mouth daily.     Facility-Administered Encounter Medications as of 02/17/2021  Medication   sodium chloride flush (NS) 0.9 % injection 3 mL   Current antihyperglycemic regimen:  Januvia 25 mg take one tab daily  What recent interventions/DTPs have been made to improve glycemic control:  None noted  Have there been any recent hospitalizations or ED visits since last visit with CPP? No  Patient denies hypoglycemic symptoms, including Pale, Sweaty, Shaky, Hungry, Nervous/irritable, and Vision changes  Patient denies hyperglycemic symptoms, including blurry vision, excessive thirst, fatigue, polyuria, and weakness  How often are you checking your blood sugar?  Patient states he has not been checking his blood sugar.  What are your blood sugars ranging?  Fasting: N/A Before meals:N/A  After meals: N/A Bedtime: N/A  During the week, how often does your blood glucose drop below 70? Never  Are you checking your feet daily/regularly?  Patient states he does check his feet regularly.  Adherence Review: Is the patient currently on a STATIN medication? Yes Is the patient currently on ACE/ARB medication? No Does the patient have >5 day gap between last estimated fill dates? No   Care Gaps: COVID-19 Vaccine:Overdue since 05/27/2019 Zoster Vaccines- Shingrix:Overdue since 11/17/2020  Star Rating  Drugs: Atorvastatin 80 mg Last filled:01/24/21 90 DS Januvia 25 mg Last filled:01/24/21 90 DS  Myriam Elta [place], [place]

## 2021-02-25 ENCOUNTER — Inpatient Hospital Stay
Admission: EM | Admit: 2021-02-25 | Discharge: 2021-03-16 | DRG: 871 | Disposition: E | Payer: Medicare Other | Attending: Internal Medicine | Admitting: Internal Medicine

## 2021-02-25 ENCOUNTER — Other Ambulatory Visit: Payer: Self-pay

## 2021-02-25 ENCOUNTER — Emergency Department: Payer: Medicare Other

## 2021-02-25 ENCOUNTER — Encounter: Payer: Self-pay | Admitting: *Deleted

## 2021-02-25 ENCOUNTER — Ambulatory Visit: Payer: Self-pay | Admitting: *Deleted

## 2021-02-25 DIAGNOSIS — Z7982 Long term (current) use of aspirin: Secondary | ICD-10-CM

## 2021-02-25 DIAGNOSIS — E8809 Other disorders of plasma-protein metabolism, not elsewhere classified: Secondary | ICD-10-CM | POA: Diagnosis present

## 2021-02-25 DIAGNOSIS — E782 Mixed hyperlipidemia: Secondary | ICD-10-CM | POA: Diagnosis present

## 2021-02-25 DIAGNOSIS — A4189 Other specified sepsis: Principal | ICD-10-CM | POA: Diagnosis present

## 2021-02-25 DIAGNOSIS — Z87891 Personal history of nicotine dependence: Secondary | ICD-10-CM | POA: Diagnosis not present

## 2021-02-25 DIAGNOSIS — E119 Type 2 diabetes mellitus without complications: Secondary | ICD-10-CM

## 2021-02-25 DIAGNOSIS — Z66 Do not resuscitate: Secondary | ICD-10-CM | POA: Diagnosis present

## 2021-02-25 DIAGNOSIS — I252 Old myocardial infarction: Secondary | ICD-10-CM

## 2021-02-25 DIAGNOSIS — L89152 Pressure ulcer of sacral region, stage 2: Secondary | ICD-10-CM | POA: Diagnosis present

## 2021-02-25 DIAGNOSIS — Z8673 Personal history of transient ischemic attack (TIA), and cerebral infarction without residual deficits: Secondary | ICD-10-CM

## 2021-02-25 DIAGNOSIS — R570 Cardiogenic shock: Secondary | ICD-10-CM | POA: Diagnosis not present

## 2021-02-25 DIAGNOSIS — I5023 Acute on chronic systolic (congestive) heart failure: Secondary | ICD-10-CM | POA: Diagnosis present

## 2021-02-25 DIAGNOSIS — K449 Diaphragmatic hernia without obstruction or gangrene: Secondary | ICD-10-CM | POA: Diagnosis not present

## 2021-02-25 DIAGNOSIS — N186 End stage renal disease: Secondary | ICD-10-CM | POA: Diagnosis present

## 2021-02-25 DIAGNOSIS — G309 Alzheimer's disease, unspecified: Secondary | ICD-10-CM | POA: Diagnosis present

## 2021-02-25 DIAGNOSIS — Z79899 Other long term (current) drug therapy: Secondary | ICD-10-CM

## 2021-02-25 DIAGNOSIS — Z992 Dependence on renal dialysis: Secondary | ICD-10-CM

## 2021-02-25 DIAGNOSIS — K839 Disease of biliary tract, unspecified: Secondary | ICD-10-CM

## 2021-02-25 DIAGNOSIS — F0280 Dementia in other diseases classified elsewhere without behavioral disturbance: Secondary | ICD-10-CM | POA: Diagnosis present

## 2021-02-25 DIAGNOSIS — E872 Acidosis, unspecified: Secondary | ICD-10-CM | POA: Diagnosis present

## 2021-02-25 DIAGNOSIS — R6521 Severe sepsis with septic shock: Secondary | ICD-10-CM | POA: Diagnosis present

## 2021-02-25 DIAGNOSIS — R64 Cachexia: Secondary | ICD-10-CM | POA: Diagnosis present

## 2021-02-25 DIAGNOSIS — E871 Hypo-osmolality and hyponatremia: Secondary | ICD-10-CM | POA: Diagnosis present

## 2021-02-25 DIAGNOSIS — A419 Sepsis, unspecified organism: Secondary | ICD-10-CM | POA: Diagnosis not present

## 2021-02-25 DIAGNOSIS — I2510 Atherosclerotic heart disease of native coronary artery without angina pectoris: Secondary | ICD-10-CM | POA: Diagnosis present

## 2021-02-25 DIAGNOSIS — Z7902 Long term (current) use of antithrombotics/antiplatelets: Secondary | ICD-10-CM

## 2021-02-25 DIAGNOSIS — I214 Non-ST elevation (NSTEMI) myocardial infarction: Secondary | ICD-10-CM | POA: Diagnosis present

## 2021-02-25 DIAGNOSIS — N2581 Secondary hyperparathyroidism of renal origin: Secondary | ICD-10-CM | POA: Diagnosis present

## 2021-02-25 DIAGNOSIS — I700 Atherosclerosis of aorta: Secondary | ICD-10-CM | POA: Diagnosis not present

## 2021-02-25 DIAGNOSIS — J1282 Pneumonia due to coronavirus disease 2019: Secondary | ICD-10-CM | POA: Diagnosis present

## 2021-02-25 DIAGNOSIS — G301 Alzheimer's disease with late onset: Secondary | ICD-10-CM | POA: Diagnosis present

## 2021-02-25 DIAGNOSIS — K219 Gastro-esophageal reflux disease without esophagitis: Secondary | ICD-10-CM | POA: Diagnosis present

## 2021-02-25 DIAGNOSIS — R627 Adult failure to thrive: Secondary | ICD-10-CM

## 2021-02-25 DIAGNOSIS — Z515 Encounter for palliative care: Secondary | ICD-10-CM

## 2021-02-25 DIAGNOSIS — J189 Pneumonia, unspecified organism: Secondary | ICD-10-CM

## 2021-02-25 DIAGNOSIS — R109 Unspecified abdominal pain: Secondary | ICD-10-CM | POA: Diagnosis not present

## 2021-02-25 DIAGNOSIS — Z833 Family history of diabetes mellitus: Secondary | ICD-10-CM

## 2021-02-25 DIAGNOSIS — U071 COVID-19: Secondary | ICD-10-CM

## 2021-02-25 DIAGNOSIS — Z681 Body mass index (BMI) 19 or less, adult: Secondary | ICD-10-CM

## 2021-02-25 DIAGNOSIS — Z7984 Long term (current) use of oral hypoglycemic drugs: Secondary | ICD-10-CM

## 2021-02-25 DIAGNOSIS — D631 Anemia in chronic kidney disease: Secondary | ICD-10-CM | POA: Diagnosis not present

## 2021-02-25 DIAGNOSIS — E1122 Type 2 diabetes mellitus with diabetic chronic kidney disease: Secondary | ICD-10-CM | POA: Diagnosis present

## 2021-02-25 DIAGNOSIS — Z951 Presence of aortocoronary bypass graft: Secondary | ICD-10-CM

## 2021-02-25 DIAGNOSIS — E876 Hypokalemia: Secondary | ICD-10-CM | POA: Diagnosis present

## 2021-02-25 DIAGNOSIS — Z823 Family history of stroke: Secondary | ICD-10-CM

## 2021-02-25 DIAGNOSIS — I132 Hypertensive heart and chronic kidney disease with heart failure and with stage 5 chronic kidney disease, or end stage renal disease: Secondary | ICD-10-CM | POA: Diagnosis present

## 2021-02-25 DIAGNOSIS — R9431 Abnormal electrocardiogram [ECG] [EKG]: Secondary | ICD-10-CM | POA: Diagnosis not present

## 2021-02-25 DIAGNOSIS — L8990 Pressure ulcer of unspecified site, unspecified stage: Secondary | ICD-10-CM | POA: Insufficient documentation

## 2021-02-25 DIAGNOSIS — I959 Hypotension, unspecified: Secondary | ICD-10-CM | POA: Diagnosis not present

## 2021-02-25 DIAGNOSIS — R188 Other ascites: Secondary | ICD-10-CM | POA: Diagnosis not present

## 2021-02-25 HISTORY — DX: Dementia in other diseases classified elsewhere, unspecified severity, without behavioral disturbance, psychotic disturbance, mood disturbance, and anxiety: F02.80

## 2021-02-25 HISTORY — DX: Unspecified systolic (congestive) heart failure: I50.20

## 2021-02-25 LAB — COMPREHENSIVE METABOLIC PANEL
ALT: 9 U/L (ref 0–44)
AST: 19 U/L (ref 15–41)
Albumin: 2.2 g/dL — ABNORMAL LOW (ref 3.5–5.0)
Alkaline Phosphatase: 74 U/L (ref 38–126)
Anion gap: 17 — ABNORMAL HIGH (ref 5–15)
BUN: 55 mg/dL — ABNORMAL HIGH (ref 8–23)
CO2: 24 mmol/L (ref 22–32)
Calcium: 7.5 mg/dL — ABNORMAL LOW (ref 8.9–10.3)
Chloride: 89 mmol/L — ABNORMAL LOW (ref 98–111)
Creatinine, Ser: 8.41 mg/dL — ABNORMAL HIGH (ref 0.61–1.24)
GFR, Estimated: 6 mL/min — ABNORMAL LOW (ref >60)
Glucose, Bld: 180 mg/dL — ABNORMAL HIGH (ref 70–99)
Potassium: 3.2 mmol/L — ABNORMAL LOW (ref 3.5–5.1)
Sodium: 130 mmol/L — ABNORMAL LOW (ref 135–145)
Total Bilirubin: 0.9 mg/dL (ref 0.3–1.2)
Total Protein: 7 g/dL (ref 6.5–8.1)

## 2021-02-25 LAB — CBC WITH DIFFERENTIAL/PLATELET
Abs Immature Granulocytes: 0.11 10*3/uL — ABNORMAL HIGH (ref 0.00–0.07)
Basophils Absolute: 0.1 10*3/uL (ref 0.0–0.1)
Basophils Relative: 1 %
Eosinophils Absolute: 0 10*3/uL (ref 0.0–0.5)
Eosinophils Relative: 0 %
HCT: 23.9 % — ABNORMAL LOW (ref 39.0–52.0)
Hemoglobin: 7.8 g/dL — ABNORMAL LOW (ref 13.0–17.0)
Immature Granulocytes: 1 %
Lymphocytes Relative: 29 %
Lymphs Abs: 4.6 10*3/uL — ABNORMAL HIGH (ref 0.7–4.0)
MCH: 31.5 pg (ref 26.0–34.0)
MCHC: 32.6 g/dL (ref 30.0–36.0)
MCV: 96.4 fL (ref 80.0–100.0)
Monocytes Absolute: 1.1 10*3/uL — ABNORMAL HIGH (ref 0.1–1.0)
Monocytes Relative: 7 %
Neutro Abs: 9.8 10*3/uL — ABNORMAL HIGH (ref 1.7–7.7)
Neutrophils Relative %: 62 %
Platelets: 272 10*3/uL (ref 150–400)
RBC: 2.48 MIL/uL — ABNORMAL LOW (ref 4.22–5.81)
RDW: 13.3 % (ref 11.5–15.5)
WBC: 15.7 10*3/uL — ABNORMAL HIGH (ref 4.0–10.5)
nRBC: 0 % (ref 0.0–0.2)

## 2021-02-25 LAB — LACTIC ACID, PLASMA
Lactic Acid, Venous: 2.5 mmol/L (ref 0.5–1.9)
Lactic Acid, Venous: 2.4 mmol/L (ref 0.5–1.9)

## 2021-02-25 LAB — LIPASE, BLOOD: Lipase: 32 U/L (ref 11–51)

## 2021-02-25 LAB — RESP PANEL BY RT-PCR (FLU A&B, COVID) ARPGX2
Influenza A by PCR: NEGATIVE
Influenza B by PCR: NEGATIVE
SARS Coronavirus 2 by RT PCR: POSITIVE — AB

## 2021-02-25 LAB — BRAIN NATRIURETIC PEPTIDE: B Natriuretic Peptide: 1508 pg/mL — ABNORMAL HIGH (ref 0.0–100.0)

## 2021-02-25 LAB — TROPONIN I (HIGH SENSITIVITY)
Troponin I (High Sensitivity): 169 ng/L (ref <18)
Troponin I (High Sensitivity): 590 ng/L (ref <18)

## 2021-02-25 LAB — PHOSPHORUS: Phosphorus: 4.6 mg/dL (ref 2.5–4.6)

## 2021-02-25 LAB — PROCALCITONIN: Procalcitonin: 0.94 ng/mL

## 2021-02-25 LAB — MAGNESIUM: Magnesium: 1.8 mg/dL (ref 1.7–2.4)

## 2021-02-25 MED ORDER — DOCUSATE SODIUM 100 MG PO CAPS: 100.0000 mg | ORAL_CAPSULE | Freq: Two times a day (BID) | ORAL | Status: DC | PRN

## 2021-02-25 MED ORDER — SODIUM CHLORIDE 0.9 % IV SOLN
1.0000 g | Freq: Once | INTRAVENOUS | Status: AC
Start: 2021-02-25 — End: 2021-02-25
  Administered 2021-02-25: 1 g via INTRAVENOUS
  Filled 2021-02-25: qty 10

## 2021-02-25 MED ORDER — NOREPINEPHRINE 4 MG/250ML-% IV SOLN
0.0000 ug/min | INTRAVENOUS | Status: DC
  Administered 2021-02-25: 2 ug/min via INTRAVENOUS
  Administered 2021-02-26: 40 ug/min via INTRAVENOUS
  Filled 2021-02-25 (×2): qty 250

## 2021-02-25 MED ORDER — ALBUMIN HUMAN 25 % IV SOLN
25.0000 g | Freq: Four times a day (QID) | INTRAVENOUS | Status: AC
  Administered 2021-02-26 (×4): 25 g via INTRAVENOUS
  Filled 2021-02-25 (×3): qty 100

## 2021-02-25 MED ORDER — POLYETHYLENE GLYCOL 3350 17 G PO PACK: 17.0000 g | PACK | Freq: Every day | ORAL | Status: DC | PRN

## 2021-02-25 MED ORDER — SODIUM CHLORIDE 0.9 % IV BOLUS
1000.0000 mL | Freq: Once | INTRAVENOUS | Status: AC
  Administered 2021-02-25: 1000 mL via INTRAVENOUS

## 2021-02-25 MED ORDER — ONDANSETRON HCL 4 MG/2ML IJ SOLN
4.0000 mg | Freq: Four times a day (QID) | INTRAMUSCULAR | Status: DC | PRN
  Administered 2021-02-26 (×2): 4 mg via INTRAVENOUS
  Filled 2021-02-25 (×3): qty 2

## 2021-02-25 MED ORDER — HEPARIN BOLUS VIA INFUSION
2900.0000 [IU] | Freq: Once | INTRAVENOUS | Status: AC
  Administered 2021-02-26: 2900 [IU] via INTRAVENOUS
  Filled 2021-02-25: qty 2900

## 2021-02-25 MED ORDER — SODIUM CHLORIDE 0.9 % IV BOLUS
2000.0000 mL | Freq: Once | INTRAVENOUS | Status: AC
  Administered 2021-02-25: 2000 mL via INTRAVENOUS

## 2021-02-25 MED ORDER — PANTOPRAZOLE SODIUM 40 MG IV SOLR
40.0000 mg | Freq: Every day | INTRAVENOUS | Status: DC
  Administered 2021-02-26 (×2): 40 mg via INTRAVENOUS
  Filled 2021-02-25 (×2): qty 40

## 2021-02-25 MED ORDER — ASPIRIN 81 MG PO CHEW
324.0000 mg | CHEWABLE_TABLET | Freq: Once | ORAL | Status: AC
Start: 2021-02-25 — End: 2021-02-25
  Administered 2021-02-25: 324 mg via ORAL
  Filled 2021-02-25: qty 4

## 2021-02-25 MED ORDER — SODIUM CHLORIDE 0.9 % IV SOLN
500.0000 mg | Freq: Once | INTRAVENOUS | Status: AC
  Administered 2021-02-25: 500 mg via INTRAVENOUS
  Filled 2021-02-25: qty 5

## 2021-02-25 MED ORDER — ACETAMINOPHEN 325 MG PO TABS: 650.0000 mg | ORAL_TABLET | ORAL | Status: DC | PRN

## 2021-02-25 MED ORDER — HEPARIN (PORCINE) 25000 UT/250ML-% IV SOLN
750.0000 [IU]/h | INTRAVENOUS | Status: DC
  Administered 2021-02-26: 600 [IU]/h via INTRAVENOUS
  Filled 2021-02-25: qty 250

## 2021-02-25 NOTE — Progress Notes (Signed)
[place] for heparin infusion Indication: ACS/STEMI  Allergies  Allergen Reactions   Bee Venom Hives   Iodinated Contrast Media Rash   Metrizamide Rash   Other Rash and Other (See Comments)    Dye    Patient Measurements: Height: 5\' 6"  (167.6 cm) Weight: 47.6 kg (105 lb) IBW/kg (Calculated) : 63.8 Heparin Dosing Weight: 47.6 kg  Vital Signs: Temp Source: Oral (01/13 1740) BP: 52/33 (01/13 2230) Pulse Rate: 75 (01/13 2230)  Labs: Recent Labs    03/11/2021 1750 02/24/2021 2017  HGB 7.8*  --   HCT 23.9*  --   PLT 272  --   CREATININE 8.41*  --   TROPONINIHS 169* 590*    Estimated Creatinine Clearance: 4.1 mL/min (A) (by C-G formula based on SCr of 8.41 mg/dL (H)).   Medical History: Past Medical History:  Diagnosis Date   Anemia    Chronic kidney disease    STAGE 4   Coronary atherosclerosis    CABG X 4   Diabetes mellitus without complication (HCC)    Edema    ankle   GERD (gastroesophageal reflux disease)    Glaucoma    Hearing loss    Heart murmur    History of hiatal hernia    Hyperlipidemia    Hypertension    Mini stroke    Mitral insufficiency    Myocardial infarction (HCC)    mild, heart cath done no stents needed   Stroke [facility]) 2011   TIA    Assessment: Pt is 88 yo male w/ ESRD on PD, presenting to ED c/o increasing weakness/lethargy following recent COVID infection found with elevated Troponin I, trending up.  Goal of Therapy:  Heparin level 0.3-0.7 units/ml Monitor platelets by anticoagulation protocol: Yes   Plan:  Bolus 2900 units x 1 Start heparin infusion at 600 units/hr Check HL in 8 hrs after start of infusion CBC daily while on heparin  Renda Rolls, PharmD, [facility] 02/17/2021 11:22 PM

## 2021-02-25 NOTE — ED Triage Notes (Signed)
Pt tested pos for covid dec 24th, pt reports getting medication treatment for covid but can't remember what he took, pt states that he has cont to be extremely weak and without appetite. Pt states that he does home dialysis nightly and reports that he has lost 15 lbs.

## 2021-02-25 NOTE — Telephone Encounter (Signed)
Attempted to contact patient, no answer unable to LVM. Will try again.

## 2021-02-25 NOTE — Telephone Encounter (Signed)
Please reiterate that patient should be seen in the ER due to significant symptoms.

## 2021-02-25 NOTE — Telephone Encounter (Signed)
Pt is scheduled to see Santiago Glad next week

## 2021-02-25 NOTE — ED Provider Notes (Signed)
[facility] Provider Note    Event Date/Time   First MD Initiated Contact with Patient 03/05/2021 2248     (approximate)   History   Weakness   HPI Ray Lamb is a 88 y.o. male with a stated past medical history of end-stage renal disease on peritoneal dialysis nightly who presents for worsening lethargy in the setting of recent COVID infection.  Patient states that his energy levels have just been decreasing over the last couple weeks.  Patient denies any chest pain, shortness of breath, bleeding or dark stools, nausea/vomiting/diarrhea, or weakness/numbness/paresthesias in any extremity     Physical Exam   Triage Vital Signs: ED Triage Vitals  Enc Vitals Group     BP 02/26/2021 1740 (!) 88/62     Pulse Rate 03/09/2021 1740 96     Resp 03/08/2021 1740 16     Temp --      Temp Source 03/07/2021 1740 Oral     SpO2 02/25/2021 1740 100 %     Weight 02/21/2021 1748 105 lb (47.6 kg)     Height 02/26/2021 1748 5\' 6"  (1.676 m)     Head Circumference --      Peak Flow --      Pain Score 03/09/2021 1747 9     Pain Loc --      Pain Edu? --      Excl. in [place]? --     Most recent vital signs: Vitals:   02/23/2021 2200 03/09/2021 2230  BP: (!) 64/43 (!) 52/33  Pulse: 70 75  Resp: (!) 21 (!) 24  SpO2: 91% 99%    General: Awake, no distress.  CV:  Good peripheral perfusion.  Resp:  Normal effort.  Abd:  No distention.  Peritoneal dialysis catheter in place without surrounding erythema Other:  Chronically ill-appearing 88 year old cachectic Caucasian male sitting in recliner   ED Results / Procedures / Treatments   Labs (all labs ordered are listed, but only abnormal results are displayed) Labs Reviewed  RESP PANEL BY RT-PCR (FLU A&B, COVID) ARPGX2 - Abnormal; Notable for the following components:      Result Value   SARS Coronavirus 2 by RT PCR POSITIVE (*)    All other components within normal limits  CBC WITH DIFFERENTIAL/PLATELET - Abnormal; Notable for the  following components:   WBC 15.7 (*)    RBC 2.48 (*)    Hemoglobin 7.8 (*)    HCT 23.9 (*)    Neutro Abs 9.8 (*)    Lymphs Abs 4.6 (*)    Monocytes Absolute 1.1 (*)    Abs Immature Granulocytes 0.11 (*)    All other components within normal limits  COMPREHENSIVE METABOLIC PANEL - Abnormal; Notable for the following components:   Sodium 130 (*)    Potassium 3.2 (*)    Chloride 89 (*)    Glucose, Bld 180 (*)    BUN 55 (*)    Creatinine, Ser 8.41 (*)    Calcium 7.5 (*)    Albumin 2.2 (*)    GFR, Estimated 6 (*)    Anion gap 17 (*)    All other components within normal limits  LACTIC ACID, PLASMA - Abnormal; Notable for the following components:   Lactic Acid, Venous 2.5 (*)    All other components within normal limits  LACTIC ACID, PLASMA - Abnormal; Notable for the following components:   Lactic Acid, Venous 2.4 (*)    All other components within normal limits  TROPONIN I (HIGH SENSITIVITY) - Abnormal; Notable for the following components:   Troponin I (High Sensitivity) 169 (*)    All other components within normal limits  TROPONIN I (HIGH SENSITIVITY) - Abnormal; Notable for the following components:   Troponin I (High Sensitivity) 590 (*)    All other components within normal limits  CULTURE, BLOOD (ROUTINE X 2)  CULTURE, BLOOD (ROUTINE X 2)  LIPASE, BLOOD  PROCALCITONIN  URINALYSIS, COMPLETE (UACMP) WITH MICROSCOPIC  PROCALCITONIN  CBC  BASIC METABOLIC PANEL  MAGNESIUM  PHOSPHORUS  MAGNESIUM  PHOSPHORUS  BRAIN NATRIURETIC PEPTIDE  TROPONIN I (HIGH SENSITIVITY)     EKG ED ECG REPORT I, Naaman Plummer, the attending physician, personally viewed and interpreted this ECG.  Date: 03/08/2021 EKG Time: 2016 Rate: 88 Rhythm: normal sinus rhythm QRS Axis: normal Intervals: normal ST/T Wave abnormalities: normal Narrative Interpretation: no evidence of acute ischemia   RADIOLOGY ED MD interpretation: 2 view x-ray of the chest shows patchy lower lobe airspace  disease bilaterally  Official radiology report(s): DG Chest 2 View  Result Date: 03/04/2021 CLINICAL DATA:  Positive COVID test EXAM: CHEST - 2 VIEW COMPARISON:  04/09/2018 FINDINGS: Sternal wires overlie enlarged cardiac silhouette. Large hiatal hernia noted. Patchy airspace disease in lower lobes. Upper lungs clear. IMPRESSION: Patchy lower lobe airspace disease could indicate viral pneumonia. Large hiatal hernia. Electronically Signed   By: Suzy Bouchard M.D.   On: 03/07/2021 18:20      PROCEDURES:  Critical Care performed: Yes, see critical care procedure note(s)  Procedures  CRITICAL CARE Performed by: Naaman Plummer   Total critical care time: 37 minutes  Critical care time was exclusive of separately billable procedures and treating other patients.  Critical care was necessary to treat or prevent imminent or life-threatening deterioration.  Critical care was time spent personally by me on the following activities: development of treatment plan with patient and/or surrogate as well as nursing, discussions with consultants, evaluation of patient's response to treatment, examination of patient, obtaining history from patient or surrogate, ordering and performing treatments and interventions, ordering and review of laboratory studies, ordering and review of radiographic studies, pulse oximetry and re-evaluation of patient's condition.   MEDICATIONS ORDERED IN ED: Medications  norepinephrine (LEVOPHED) 4mg  in 229mL (0.016 mg/mL) premix infusion (5 mcg/min Intravenous Rate/Dose Change 02/14/2021 2314)  azithromycin (ZITHROMAX) 500 mg in sodium chloride 0.9 % 250 mL IVPB (500 mg Intravenous New Bag/Given 02/28/2021 2301)  docusate sodium (COLACE) capsule 100 mg (has no administration in time range)  polyethylene glycol (MIRALAX / GLYCOLAX) packet 17 g (has no administration in time range)  pantoprazole (PROTONIX) injection 40 mg (has no administration in time range)  ondansetron  (ZOFRAN) injection 4 mg (has no administration in time range)  acetaminophen (TYLENOL) tablet 650 mg (has no administration in time range)  albumin human 25 % solution 25 g (has no administration in time range)  sodium chloride 0.9 % bolus 1,000 mL (0 mLs Intravenous Stopped 03/09/2021 2251)  cefTRIAXone (ROCEPHIN) 1 g in sodium chloride 0.9 % 100 mL IVPB (0 g Intravenous Stopped 02/26/2021 2043)  sodium chloride 0.9 % bolus 2,000 mL (0 mLs Intravenous Stopped 03/04/2021 2252)  aspirin chewable tablet 324 mg (324 mg Oral Given 02/24/2021 2313)     IMPRESSION / MDM / ASSESSMENT AND PLAN / ED COURSE  I reviewed the triage vital signs and the nursing notes.  Differential diagnosis includes, but is not limited to, sepsis, pneumonia, CVA, symptomatic anemia, GI bleeding   The patient is on the cardiac monitor to evaluate for evidence of arrhythmia and/or significant heart rate changes.  Presents with malaise concerning for pneumonia.  DDx: PE, COPD exacerbation, Pneumothorax, TB, Atypical ACS, Esophageal Rupture, Toxic Exposure, Foreign Body Airway Obstruction.  Workup: CXR CBC, CMP, lactate, troponin  Given History, Exam, and Workup presentation most consistent with pneumonia.  Findings: Leukocytosis to 15 Hemoglobin 7.8, hematocrit 23.9 Lactic acid 2.4 Coronavirus PCR positive Patient's CMP shows evidence of hyponatremia to 130, hypokalemia to 3.2, and baseline creatinine elevation  Tx: Ceftriaxone 1g IV Azithromycin 500mg  IV  2232 Reassessment: Patient's blood pressure persistently hypotensive despite fluid bolus of 30 cc/kg and therefore will start Levophed drip as well as admit to the ICU service for further evaluation and management  Disposition: Admit       FINAL CLINICAL IMPRESSION(S) / ED DIAGNOSES   Final diagnoses:  Sepsis without acute organ dysfunction, due to unspecified organism [facility])  Community acquired pneumonia, unspecified laterality      Rx / DC Orders   ED Discharge Orders     None        Note:  This document was prepared using Dragon voice recognition software and may include unintentional dictation errors.   Naaman Plummer, MD 03/02/2021 [phone]

## 2021-02-25 NOTE — Telephone Encounter (Signed)
Patient's wife returned call. Reviewed message from K. Mathis Dad, NP from this am regarding cough. Patient's wife reports patient weak and requires physical assistance to stand and walk due to weakness. Poor po intake. Reports weight loss since covid . Prior to covid 01/25/21 wt 128 lbs. Now wt 105 lbs. Cbg this am 228. Recommended ED due to decrease in mobility. Denies dizziness when standing . Encouraged increase po intake. Patient reports he does not want to go to ED. Grandson with patient and wife at this time and will assist patient with mobility. Please advise wife if ED recommended.

## 2021-02-25 NOTE — Telephone Encounter (Signed)
Looks like patient was sen by Dr. Wynetta Emery on 02/17/2021 for COVID.  It is normal for cough to linger for 4-6 weeks after illness.  He was given Benzonatate which he can continue to take as needed for cough.

## 2021-02-25 NOTE — Telephone Encounter (Signed)
This encounter was created in error - please disregard.

## 2021-02-25 NOTE — Telephone Encounter (Signed)
°  Chief Complaint: COVID- lingering cough with clear congestion- no appetite-weight loss Symptoms: cough, weight loss Frequency: constant Pertinent Negatives: Patient denies fever Disposition: [] ED /[] Urgent Care (no appt availability in office) / [] Appointment(In office/virtual)/ []  [facility]/ [] Home Care/ [x] Refused Recommended Disposition /[]  Mobile Bus/ []  Follow-up with PCP Additional Notes: Patient is calling with symptoms of COVID- cough with congestion, weight loss- declines appointment- wife has [facility] appointment today-needs appointment- next steps

## 2021-02-25 NOTE — H&P (Addendum)
NAME:  Ray Lamb, MRN:  [id number], DOB:  [birth year], LOS: 0 ADMISSION DATE:  03/12/2021, CONSULTATION DATE:  02/21/2021 REFERRING MD:  Dr. Cheri Fowler, CHIEF COMPLAINT:  weakness   History of Present Illness:  88 yo M presenting to [facility] ED from home where he lives with his wife due to complaints of weakness and worsening lethargy in the setting of recent COVID-19 infection. Per wife and patient bedside they were both positive for COVID-19 on 02/11/21 and treated outpatient with molnipirovir, prednisone and tessalon perles. Since that time the patient's energy levels have decreased and he has been less active. The patient's wife reports very poor PO intake over the last 2 weeks. The patient denies chest pain, loss of consciousness, bleeding/dark stools or abdominal pain. He does report weakness, nausea with dry heaves, mild shortness of breath and a productive cough with clear sputum. ED course: Patient initially appeared dehydrated, then with lactic acidosis & hypotension, he was worked up per sepsis protocol. Elevated troponin caused Dr. Cheri Fowler to discuss the patient with cardiologist Dr. Corky Sox who recommended heparin drip & ASA per handoff report. Medications given: Ceftriaxone & Azithromycin, ASA & heparin drip started (per cards recs), 3 L IVF bolus & levophed drip initiated Initial Vitals: RR-16, HR- 96, hypotensive- 88/62 & SpO2 100 % on RA. (initially VSS with the exception of hypotension, 3 hours later patient mildly tachypneic with RR 23 & severely hypotensive at 63/45) Significant labs: (Labs/ Imaging personally reviewed) I, Domingo Pulse [place], AGACNP-BC, personally viewed and interpreted this ECG. EKG Interpretation: Date: 03/01/2021, EKG Time: 2143, Rate: 76, Rhythm: NSR, QRS Axis: LAD, Intervals: Prolonged PR interval, ST/T Wave abnormalities: non-specific T wave inversions, minimal ST depression in lead II/ V4/V5, Narrative Interpretation: NSR with prolonged PR interval Chemistry:  Na+:130, K+: 3.2, BUN/Cr.: 55/8.41, Serum CO2/ AG: 24/17, albumin: 2.2 Hematology: WBC: 15.7, Hgb: 7.8,  Troponin: 169> 590, BNP: 1508, Lactic/ PCT: 2.5 > 2.4/ 0.94,  COVID-19: positive Influenza A/B: negative  CXR 03/14/2021: Patchy lower lobe airspace disease that could indicate viral pneumonia.  Large hiatal hernia CT abdomen pelvis without contrast: Pending  PCCM consulted for admission due to suspected septic shock in the setting of COVID-19 pneumonia requiring vasopressor support.  Pertinent  Medical History  CAD s/p CABG x 4 (2003) HFrEF (LVEF 35-40% with akinesis of the anteroseptal & apical myocardium- 09/2017) ESRD on PD T2DM Late onset Alzheimer's HTN HLD CVA (2011) Hiatal hernia Remote former Smoker (quit 1968) Significant Hospital Events: Including procedures, antibiotic start and stop dates in addition to other pertinent events   03/11/2021: Admit to ICU with multifactorial shock in the setting of failure to thrive & suspected sepsis secondary to suspected COVID-19 pneumonia requiring high-dose vasopressor support.  Interim History / Subjective:  Patient alert and responsive, with short-term memory impairment in the setting of baseline late onset Alzheimer's.  Patient severely hypotensive with SBP in the 60s to 70s requiring 40 mcg of Levophed. Noted sputum to be yellow bedside.  Objective   Blood pressure (!) 52/33, pulse 75, resp. rate (!) 24, height 5\' 6"  (1.676 m), weight 47.6 kg, SpO2 99 %.       No intake or output data in the 24 hours ending 03/14/2021 2252 Filed Weights   02/23/2021 1748  Weight: 47.6 kg    Examination: General: Adult male, critically ill & frail appearing, lying in bed, NAD HEENT: MM pink/dry, anicteric, atraumatic, neck supple Neuro: A&O x 2-3, able to follow commands, PERRL +3, MAE, generalized weakness CV:  s1s2 RRR, NSR with PACs on monitor, no r/m/g auscultated Pulm: Regular, non labored on room air, breath sounds diminished  throughout GI: soft, rounded with PD catheter in place, non tender, bs x 4 Skin: Stage II injury noted on coccyx Extremities: warm/dry, pulses + 2 R/P, trace edema noted  Resolved Hospital Problem list     Assessment & Plan:  Sepsis with multifactorial shock due to suspected CAP in the setting of recent COVID-19 infection & dehydration from failure to thrive complicated by HFrEF Lactic: 2.5 > 2.4, Baseline PCT: 0.94, CXR: patchy lower lobe airspace disease that could indicate viral pneumonia. Patient not currently hypoxic. Initial interventions/workup included: 3 L of NS/LR & Ceftriaxone & Azithromycin - CT abdomen/pelvis wo contrast (allergic) to rule out as infection source - Supplemental oxygen as needed, to maintain SpO2 > 90% - ensure adequate pulmonary hygiene: IS & flutter valve - f/u cultures, trend lactic/ PCT - Daily CBC, monitor WBC/ fever curve - IV antibiotics: zosyn & vancomycin  - cautious IVF hydration due to HFrEF - Continue vasopressors to maintain MAP< 65, norepinephrine at max> place central line, add vasopressin - albumin supplementation, f/u cortisol level   Acute on Chronic HFrEF Elevated Troponin secondary to N-STEMI vs demand ischemia PMHx: HFrEF, CAD s/p CABG x 4 2003 Troponin: 169> 590, BNP: 1508. Wife reports the patient's weight has dropped recently from 128 to 105 - Echocardiogram ordered - Trend troponins  - continue outpatient midodrine - heparin drip per pharmacy consult - Continuous cardiac monitoring  - Strict I/O's - Daily BMP, replace electrolytes PRN - Daily weights to assess volume status - Supplemental oxygen as needed, maintain SpO2 > 90% - Cardiology consulted, appreciate input - home medications on hold: Imdur, coreg, torsemide. Consider restarting as patient stabilizes  ESRD on PD - send sample from PD catheter for culture - discussed patient with Dr. Juleen China > will hold off on PD overnight due to severe shock - consult to  nephrology, appreciate input  Hyperlipidemia History of CVA - continue outpatient atorvastatin, Zetia & Plavix  Type 2 Diabetes Mellitus Hemoglobin A1C: pending - Monitor CBG Q 4 hours - start SSI very sensitive dosing - outpatient regimen on hold, consider restarting as patient stabilizes - target range while in ICU: 140-180 - follow ICU hyper/hypo-glycemia protocol  Hypoalbuminemia in the setting of malnutrition & failure to thrive Hypokalemia Hyponatremia Patient unable to eat or drink very much at home - albumin supplementation - daily BMP, hold off on K+ replacement due to ESRD and inability to perform PD overnight - consult to dietary  Chronic Anemia in the setting of Chronic Disease PMHx: ESRD - Monitor for s/s of bleeding - Daily CBC - Transfuse for Hgb <7  Late onset Alzheimer's disease without behavioral disturbance - falls precautions - supportive care - consult to palliative care to address ongoing [place] in the setting of failure to thrive - consult to [facility] to assist with discharge needs  Stage II Pressure Injury -  PTA Pressure Injury 03/14/2021 Coccyx Medial Stage 2 -  Partial thickness loss of dermis presenting as a shallow open injury with a red, pink wound bed without slough. (Active)  03/15/2021 2000  Location: Coccyx  Location Orientation: Medial  Staging: Stage 2 -  Partial thickness loss of dermis presenting as a shallow open injury with a red, pink wound bed without slough.  Wound Description (Comments):   Present on Admission: Yes  - turn/reposition Q 2 h - consult to [place] - cleanse, moisturize &  follow with a skin protectant - utilize hydrogel daily - maximize nutrition, see above  Best Practice (right click and "Reselect all SmartList Selections" daily)  Diet/type: NPO w/ oral meds DVT prophylaxis: systemic heparin GI prophylaxis: PPI Lines: Central line Foley:  N/A Code Status:  full code Last date of multidisciplinary goals of care discussion  [02/21/2021] GOC: patient and wife confirmed CODE STATUS bedside, understanding critical condition.  Labs   CBC: Recent Labs  Lab 02/23/2021 1750  WBC 15.7*  NEUTROABS 9.8*  HGB 7.8*  HCT 23.9*  MCV 96.4  PLT 737    Basic Metabolic Panel: Recent Labs  Lab 02/15/2021 1750  NA 130*  K 3.2*  CL 89*  CO2 24  GLUCOSE 180*  BUN 55*  CREATININE 8.41*  CALCIUM 7.5*   GFR: Estimated Creatinine Clearance: 4.1 mL/min (A) (by C-G formula based on SCr of 8.41 mg/dL (H)). Recent Labs  Lab 03/13/2021 1750 03/12/2021 1751 03/02/2021 2017  PROCALCITON 0.94  --   --   WBC 15.7*  --   --   LATICACIDVEN  --  2.5* 2.4*    Liver Function Tests: Recent Labs  Lab 03/04/2021 1750  AST 19  ALT 9  ALKPHOS 74  BILITOT 0.9  PROT 7.0  ALBUMIN 2.2*   Recent Labs  Lab 02/22/2021 1750  LIPASE 32   No results for input(s): AMMONIA in the last 168 hours.  ABG    Component Value Date/Time   PHART 7.48 (H) 10/10/2017 0500   PCO2ART 45 10/10/2017 0500   PO2ART 95 10/10/2017 0500   HCO3 33.5 (H) 10/10/2017 0500   TCO2 24 03/12/2019 1212   ACIDBASEDEF 12.6 (H) 09/30/2017 0032   O2SAT 97.9 10/10/2017 0500     Coagulation Profile: No results for input(s): INR, PROTIME in the last 168 hours.  Cardiac Enzymes: No results for input(s): CKTOTAL, CKMB, CKMBINDEX, TROPONINI in the last 168 hours.  HbA1C: Hemoglobin A1C  Date/Time Value Ref Range Status  02/23/2020 12:00 AM 7.8  Final  11/20/2019 12:00 AM 6.1  Final   HB A1C (BAYER DCA - WAIVED)  Date/Time Value Ref Range Status  01/24/2021 03:28 PM 5.9 (H) 4.8 - 5.6 % Final    Comment:             Prediabetes: 5.7 - 6.4          Diabetes: >6.4          Glycemic control for adults with diabetes: <7.0   10/26/2020 03:04 PM 5.3 4.8 - 5.6 % Final    Comment:             Prediabetes: 5.7 - 6.4          Diabetes: >6.4          Glycemic control for adults with diabetes: <7.0               **Please note reference interval change**      CBG: No results for input(s): GLUCAP in the last 168 hours.  Review of Systems: Positives in BOLD  Gen: Denies fever, chills, weight change, fatigue, night sweats HEENT: Denies blurred vision, double vision, hearing loss, tinnitus, sinus congestion, rhinorrhea, sore throat, neck stiffness, dysphagia PULM: Denies shortness of breath, cough, sputum production, hemoptysis, wheezing CV: Denies chest pain, edema, orthopnea, paroxysmal nocturnal dyspnea, palpitations GI: Denies abdominal pain, nausea, dry heaves, vomiting, diarrhea, hematochezia, melena, constipation, change in bowel habits GU: Denies dysuria, hematuria, polyuria, oliguria, urethral discharge Endocrine: Denies hot or cold  intolerance, polyuria, polyphagia or appetite change Derm: Denies rash, dry skin, scaling or peeling skin change Heme: Denies easy bruising, bleeding, bleeding gums Neuro: Denies headache, numbness, weakness, slurred speech, loss of memory or consciousness  Past Medical History:  He,  has a past medical history of Anemia, Chronic kidney disease, Coronary atherosclerosis, Diabetes mellitus without complication ([place]), Edema, GERD (gastroesophageal reflux disease), Glaucoma, Hearing loss, Heart murmur, History of hiatal hernia, Hyperlipidemia, Hypertension, Mini stroke, Mitral insufficiency, Myocardial infarction [facility]), and Stroke ([place]) (2011).   Surgical History:   Past Surgical History:  Procedure Laterality Date   CAPD INSERTION N/A 03/12/2019   Procedure: CONTINUOUS AMBULATORY PERITONEAL DIALYSIS (CAPD) CATHETER;  Surgeon: Algernon Huxley, MD;  Location: [facility] ORS;  Service: Vascular;  Laterality: N/A;   CATARACT EXTRACTION W/PHACO Left 09/05/2016   Procedure: CATARACT EXTRACTION PHACO AND INTRAOCULAR LENS PLACEMENT ([place]);  Surgeon: Birder Robson, MD;  Location: [facility] ORS;  Service: Ophthalmology;  Laterality: Left;  [place] 00:43.1 AP% 22.6 CDE 9.77 Fluid Pack lot #[id number]   CATARACT EXTRACTION W/PHACO Right  09/26/2016   Procedure: CATARACT EXTRACTION PHACO AND INTRAOCULAR LENS PLACEMENT (I[facility]);  Surgeon: Birder Robson, MD;  Location: [facility] ORS;  Service: Ophthalmology;  Laterality: Right;  [place] 01:00 AP% 16.0 CDE 9.71 Fluid pack lot # [id number]   CORONARY ARTERY BYPASS GRAFT  2003   DIALYSIS/PERMA CATHETER INSERTION N/A 10/04/2017   Procedure: DIALYSIS/PERMA CATHETER INSERTION;  Surgeon: Algernon Huxley, MD;  Location: [place] [facility];  Service: Cardiovascular;  Laterality: N/A;   DIALYSIS/PERMA CATHETER REMOVAL N/A 11/01/2017   Procedure: DIALYSIS/PERMA CATHETER REMOVAL;  Surgeon: Algernon Huxley, MD;  Location: [place] [facility];  Service: Cardiovascular;  Laterality: N/A;   ESOPHAGOGASTRODUODENOSCOPY (EGD) WITH PROPOFOL N/A 04/25/2016   Procedure: ESOPHAGOGASTRODUODENOSCOPY (EGD) WITH PROPOFOL;  Surgeon: Lucilla Lame, MD;  Location: [facility] ENDOSCOPY;  Service: Endoscopy;  Laterality: N/A;   JOINT REPLACEMENT Right 2010   knee   LEFT HEART CATH AND CORONARY ANGIOGRAPHY N/A 10/04/2017   Procedure: LEFT HEART CATH AND CORONARY ANGIOGRAPHY;  Surgeon: Isaias Cowman, MD;  Location: [place] [facility];  Service: Cardiovascular;  Laterality: N/A;   LEFT HEART CATH AND CORONARY ANGIOGRAPHY Left 07/01/2018   Procedure: LEFT HEART CATH AND CORONARY ANGIOGRAPHY;  Surgeon: Dionisio David, MD;  Location: [place] [facility];  Service: Cardiovascular;  Laterality: Left;   SKIN CANCER EXCISION       Social History:   reports that he quit smoking about 56 years ago. His smoking use included cigarettes. He has never used smokeless tobacco. He reports that he does not drink alcohol and does not use drugs.   Family History:  His family history includes Diabetes in his maternal grandmother, mother, and sister; Stroke in his mother.   Allergies Allergies  Allergen Reactions   Bee Venom Hives   Iodinated Contrast Media Rash   Metrizamide Rash   Other Rash and Other (See Comments)    Dye     Home  Medications  Prior to Admission medications   Medication Sig Start Date End Date Taking? Authorizing Provider  ASPIRIN 81 PO Take by mouth every other day.    Yes [provider]  atorvastatin (LIPITOR) 80 MG tablet Take 1 tablet (80 mg total) by mouth at bedtime. 01/24/21  Yes Johnson, Megan P, DO  benzonatate (TESSALON) 200 MG capsule Take 1 capsule (200 mg total) by mouth 2 (two) times daily as needed for cough. 02/17/21  Yes Johnson, Megan P, DO  carvedilol (COREG)  6.25 MG tablet Take 6.25 mg by mouth 2 (two) times daily. 10/14/20  Yes [provider]  clopidogrel (PLAVIX) 75 MG tablet Take 1 tablet (75 mg total) by mouth daily. 06/25/20  Yes Johnson, Megan P, DO  ezetimibe (ZETIA) 10 MG tablet Take 1 tablet (10 mg total) by mouth daily. 01/24/21  Yes Johnson, Megan P, DO  isosorbide mononitrate (IMDUR) 30 MG 24 hr tablet Take 1.5 tablets (45 mg total) by mouth daily. 01/24/21  Yes Johnson, Megan P, DO  levocetirizine (XYZAL) 5 MG tablet Take 1 tablet (5 mg total) by mouth every evening. 06/25/20  Yes Johnson, Megan P, DO  Melatonin 10 MG TABS Take 10 mg by mouth at bedtime.   Yes [provider]  mirtazapine (REMERON) 15 MG tablet Take 15 mg by mouth daily. 10/05/20  Yes [provider]  Multiple Vitamins-Minerals (CENTRUM SILVER ULTRA MENS PO) Take by mouth daily.   Yes [provider]  sitaGLIPtin (JANUVIA) 25 MG tablet Take 1 tablet (25 mg total) by mouth daily. 01/24/21  Yes Johnson, Megan P, DO  timolol (TIMOPTIC) 0.5 % ophthalmic solution Place 1 drop into both eyes daily.    Yes [provider]  torsemide (DEMADEX) 20 MG tablet Take 40 mg by mouth daily. 12/10/19  Yes [provider]  TURMERIC PO Take 1,300 mg by mouth daily.    Yes [provider]  Lancets (ONETOUCH ULTRASOFT) lancets USE TO CHECK BLOOD SUGAR TWICE A DAY 08/01/19   Johnson, Megan P, DO  midodrine (PROAMATINE) 5 MG tablet TAKE 1.0 TABLET ORAL THREE TIMES A  DAY WITH MEAL 09/08/20   [provider]  nystatin (MYCOSTATIN/NYSTOP) powder APPLY 1 APPLICATION TOPICALLY 3 (THREE) TIMES DAILY. 11/19/20   Johnson, Megan P, DO  ondansetron (ZOFRAN) 4 MG tablet Take 4 mg by mouth every 8 (eight) hours as needed. 02/17/19   [provider]     Critical care time: 60 minutes       Venetia Night, AGACNP-BC Acute Care Nurse Practitioner [facility]   [phone] / [phone] Please see Amion for pager details.

## 2021-02-25 NOTE — Telephone Encounter (Signed)
Summary: lingering cough   Pt stated he still lingering has a cough. Pt stated he isn't sure what do if he should continue to take medication he was given by PCP.  Pt is unsure of the cough medication name.   Asked pt if he wanted to schedule appt for today said no, he had other appt at [facility].   Pt seeking clinical advice.     Answer Assessment - Initial Assessment Questions 1. ONSET: "When did the cough begin?"      Cough with COVID- several weeks- 4 weeks 2. SEVERITY: "How bad is the cough today?"      productive 3. SPUTUM: "Describe the color of your sputum" (none, dry cough; clear, white, yellow, green)     Clear sputum- just alot 4. HEMOPTYSIS: "Are you coughing up any blood?" If so ask: "How much?" (flecks, streaks, tablespoons, etc.)     no 5. DIFFICULTY BREATHING: "Are you having difficulty breathing?" If Yes, ask: "How bad is it?" (e.g., mild, moderate, severe)    - MILD: No SOB at rest, mild SOB with walking, speaks normally in sentences, can lie down, no retractions, pulse < 100.    - MODERATE: SOB at rest, SOB with minimal exertion and prefers to sit, cannot lie down flat, speaks in phrases, mild retractions, audible wheezing, pulse 100-120.    - SEVERE: Very SOB at rest, speaks in single words, struggling to breathe, sitting hunched forward, retractions, pulse > 120      Some SOB due to cough 6. FEVER: "Do you have a fever?" If Yes, ask: "What is your temperature, how was it measured, and when did it start?"     no 7. CARDIAC HISTORY: "Do you have any history of heart disease?" (e.g., heart attack, congestive heart failure)      Hx disease 8. LUNG HISTORY: "Do you have any history of lung disease?"  (e.g., pulmonary embolus, asthma, emphysema)     no 9. PE RISK FACTORS: "Do you have a history of blood clots?" (or: recent major surgery, recent prolonged travel, bedridden)     *No Answer* 10. OTHER SYMPTOMS: "Do you have any other symptoms?" (e.g., runny nose, wheezing, chest  pain)       Patient is having trouble eating- weight loss- 10  lb now, glucose yesterday fasting- 228 11. PREGNANCY: "Is there any chance you are pregnant?" "When was your last menstrual period?"       *No Answer* 12. TRAVEL: "Have you traveled out of the country in the last month?" (e.g., travel history, exposures)       *No Answer*  Protocols used: Cough - Acute Productive-A-AH

## 2021-02-25 NOTE — Telephone Encounter (Signed)
Spoke with Patient, he states that he is currently at the [facility] and his wife may have exaggerated his symptoms. He also wanted to let us know that he has eaten some scrambled eggs today. He states that he will go to the ER if he needs but states that if he needs any medications that he is sure the DR. Wynetta Emery can prescribe them for him. I did reiterate that the recommendation from Santiago Glad is that he go to the ER due to his significant symptoms.

## 2021-02-25 NOTE — ED Provider Triage Note (Signed)
Emergency Medicine Provider Triage Evaluation Note  Ray Lamb , a 88 y.o. male  was evaluated in triage.  Pt complains of generalized weakness that has occurred since patient was diagnosed with COVID on December 16.  Patient has had a 15 pound weight loss and has not been able to eat or drink very much at home.  He denies chest pain, chest tightness or shortness of breath.  Review of Systems  Positive: Patient has weakness.  Negative: No chest pain or abdominal pain.   Physical Exam  There were no vitals taken for this visit. Gen:   Awake, no distress   Resp:  Normal effort  MSK:   Moves extremities without difficulty  Other:    Medical Decision Making  Medically screening exam initiated at 5:46 PM.  Appropriate orders placed.  OLUWATOMISIN Lamb was informed that the remainder of the evaluation will be completed by another provider, this initial triage assessment does not replace that evaluation, and the importance of remaining in the ED until their evaluation is complete.     Ray Lamb, [place] 02/21/2021 1747

## 2021-02-25 NOTE — Telephone Encounter (Signed)
Can we see if we can get him into the office to be seen in person next week (or to see me the following week) if he doesn't feel like he needs to go to the ER to confirm weight loss and to check him over?

## 2021-02-26 ENCOUNTER — Inpatient Hospital Stay: Payer: Medicare Other

## 2021-02-26 ENCOUNTER — Inpatient Hospital Stay
Admit: 2021-02-26 | Discharge: 2021-02-26 | Disposition: A | Discharge status: Home / Self Care | Payer: Medicare Other | Attending: Pulmonary Disease | Admitting: Pulmonary Disease

## 2021-02-26 ENCOUNTER — Encounter: Payer: Self-pay | Admitting: Internal Medicine

## 2021-02-26 DIAGNOSIS — A419 Sepsis, unspecified organism: Secondary | ICD-10-CM | POA: Diagnosis not present

## 2021-02-26 DIAGNOSIS — U071 COVID-19: Secondary | ICD-10-CM

## 2021-02-26 DIAGNOSIS — L8990 Pressure ulcer of unspecified site, unspecified stage: Secondary | ICD-10-CM | POA: Insufficient documentation

## 2021-02-26 DIAGNOSIS — R6521 Severe sepsis with septic shock: Secondary | ICD-10-CM | POA: Diagnosis not present

## 2021-02-26 DIAGNOSIS — R627 Adult failure to thrive: Secondary | ICD-10-CM

## 2021-02-26 LAB — LACTIC ACID, PLASMA
Lactic Acid, Venous: 4 mmol/L (ref 0.5–1.9)
Lactic Acid, Venous: 7.2 mmol/L (ref 0.5–1.9)

## 2021-02-26 LAB — BLOOD GAS, VENOUS
Acid-base deficit: 7.8 mmol/L — ABNORMAL HIGH (ref 0.0–2.0)
Bicarbonate: 18.3 mmol/L — ABNORMAL LOW (ref 20.0–28.0)
O2 Saturation: 31.4 %
Patient temperature: 37
pCO2, Ven: 39 mmHg — ABNORMAL LOW (ref 44.0–60.0)
pH, Ven: 7.28 (ref 7.250–7.430)
pO2, Ven: <31 mmHg — CL (ref 32.0–45.0)

## 2021-02-26 LAB — PROCALCITONIN: Procalcitonin: 0.77 ng/mL

## 2021-02-26 LAB — HEPATIC FUNCTION PANEL
ALT: 287 U/L — ABNORMAL HIGH (ref 0–44)
AST: 441 U/L — ABNORMAL HIGH (ref 15–41)
Albumin: 2.3 g/dL — ABNORMAL LOW (ref 3.5–5.0)
Alkaline Phosphatase: 110 U/L (ref 38–126)
Bilirubin, Direct: 0.6 mg/dL — ABNORMAL HIGH (ref 0.0–0.2)
Indirect Bilirubin: 0.8 mg/dL (ref 0.3–0.9)
Total Bilirubin: 1.4 mg/dL — ABNORMAL HIGH (ref 0.3–1.2)
Total Protein: 5.6 g/dL — ABNORMAL LOW (ref 6.5–8.1)

## 2021-02-26 LAB — GLUCOSE, CAPILLARY
Glucose-Capillary: 158 mg/dL — ABNORMAL HIGH (ref 70–99)
Glucose-Capillary: 168 mg/dL — ABNORMAL HIGH (ref 70–99)
Glucose-Capillary: 125 mg/dL — ABNORMAL HIGH (ref 70–99)
Glucose-Capillary: 101 mg/dL — ABNORMAL HIGH (ref 70–99)
Glucose-Capillary: 88 mg/dL (ref 70–99)

## 2021-02-26 LAB — BASIC METABOLIC PANEL
Anion gap: 13 (ref 5–15)
BUN: 52 mg/dL — ABNORMAL HIGH (ref 8–23)
CO2: 19 mmol/L — ABNORMAL LOW (ref 22–32)
Calcium: 6.2 mg/dL — CL (ref 8.9–10.3)
Chloride: 99 mmol/L (ref 98–111)
Creatinine, Ser: 7.59 mg/dL — ABNORMAL HIGH (ref 0.61–1.24)
GFR, Estimated: 6 mL/min — ABNORMAL LOW (ref >60)
Glucose, Bld: 229 mg/dL — ABNORMAL HIGH (ref 70–99)
Potassium: 3.8 mmol/L (ref 3.5–5.1)
Sodium: 131 mmol/L — ABNORMAL LOW (ref 135–145)

## 2021-02-26 LAB — CBC
HCT: 20.2 % — ABNORMAL LOW (ref 39.0–52.0)
Hemoglobin: 6.5 g/dL — ABNORMAL LOW (ref 13.0–17.0)
MCH: 31.3 pg (ref 26.0–34.0)
MCHC: 32.2 g/dL (ref 30.0–36.0)
MCV: 97.1 fL (ref 80.0–100.0)
Platelets: 211 10*3/uL (ref 150–400)
RBC: 2.08 MIL/uL — ABNORMAL LOW (ref 4.22–5.81)
RDW: 13.4 % (ref 11.5–15.5)
WBC: 17.4 10*3/uL — ABNORMAL HIGH (ref 4.0–10.5)
nRBC: 0 % (ref 0.0–0.2)

## 2021-02-26 LAB — HEPARIN LEVEL (UNFRACTIONATED)
Heparin Unfractionated: 0.17 IU/mL — ABNORMAL LOW (ref 0.30–0.70)
Heparin Unfractionated: 0.31 IU/mL (ref 0.30–0.70)

## 2021-02-26 LAB — MAGNESIUM: Magnesium: 1.6 mg/dL — ABNORMAL LOW (ref 1.7–2.4)

## 2021-02-26 LAB — TROPONIN I (HIGH SENSITIVITY)
Troponin I (High Sensitivity): 1880 ng/L (ref <18)
Troponin I (High Sensitivity): 4676 ng/L (ref <18)
Troponin I (High Sensitivity): 6370 ng/L (ref <18)
Troponin I (High Sensitivity): 9047 ng/L (ref <18)

## 2021-02-26 LAB — PREPARE RBC (CROSSMATCH)

## 2021-02-26 LAB — EXPECTORATED SPUTUM ASSESSMENT W GRAM STAIN, RFLX TO RESP C

## 2021-02-26 LAB — PROTIME-INR
INR: 1.3 — ABNORMAL HIGH (ref 0.8–1.2)
Prothrombin Time: 16.1 seconds — ABNORMAL HIGH (ref 11.4–15.2)

## 2021-02-26 LAB — HEMOGLOBIN A1C
Hgb A1c MFr Bld: 6.8 % — ABNORMAL HIGH (ref 4.8–5.6)
Mean Plasma Glucose: 148.46 mg/dL

## 2021-02-26 LAB — PHOSPHORUS: Phosphorus: 5.3 mg/dL — ABNORMAL HIGH (ref 2.5–4.6)

## 2021-02-26 LAB — AMYLASE: Amylase: 63 U/L (ref 28–100)

## 2021-02-26 LAB — CORTISOL: Cortisol, Plasma: 24.4 ug/dL

## 2021-02-26 LAB — APTT: aPTT: 30 seconds (ref 24–36)

## 2021-02-26 LAB — LIPASE, BLOOD: Lipase: 28 U/L (ref 11–51)

## 2021-02-26 LAB — MRSA NEXT GEN BY PCR, NASAL: MRSA by PCR Next Gen: DETECTED — AB

## 2021-02-26 MED ORDER — MAGNESIUM SULFATE 2 GM/50ML IV SOLN
2.0000 g | Freq: Once | INTRAVENOUS | Status: AC
  Administered 2021-02-26: 2 g via INTRAVENOUS
  Filled 2021-02-26: qty 50

## 2021-02-26 MED ORDER — MIDAZOLAM HCL 2 MG/2ML IJ SOLN
INTRAMUSCULAR | Status: AC
  Administered 2021-02-26: 2 mg via INTRAVENOUS
  Filled 2021-02-26: qty 2

## 2021-02-26 MED ORDER — NOREPINEPHRINE 16 MG/250ML-% IV SOLN
0.0000 ug/min | INTRAVENOUS | Status: DC
  Administered 2021-02-26: 40 ug/min via INTRAVENOUS
  Filled 2021-02-26 (×2): qty 250

## 2021-02-26 MED ORDER — PIPERACILLIN-TAZOBACTAM IN DEX 2-0.25 GM/50ML IV SOLN
2.2500 g | Freq: Three times a day (TID) | INTRAVENOUS | Status: DC
  Administered 2021-02-26 (×3): 2.25 g via INTRAVENOUS
  Filled 2021-02-26 (×5): qty 50

## 2021-02-26 MED ORDER — INSULIN ASPART 100 UNIT/ML IJ SOLN
0.0000 [IU] | INTRAMUSCULAR | Status: DC
  Administered 2021-02-26: 1 [IU] via SUBCUTANEOUS
  Filled 2021-02-26: qty 1

## 2021-02-26 MED ORDER — GENTAMICIN SULFATE 0.1 % EX CREA
1.0000 "application " | TOPICAL_CREAM | Freq: Every day | CUTANEOUS | Status: DC
  Filled 2021-02-26: qty 15

## 2021-02-26 MED ORDER — MIDAZOLAM HCL 2 MG/2ML IJ SOLN: 2.0000 mg | Freq: Once | INTRAMUSCULAR | Status: AC

## 2021-02-26 MED ORDER — CALCIUM CARBONATE ANTACID 500 MG PO CHEW
2.0000 | CHEWABLE_TABLET | Freq: Three times a day (TID) | ORAL | Status: DC
  Administered 2021-02-26: 400 mg via ORAL
  Filled 2021-02-26 (×2): qty 2

## 2021-02-26 MED ORDER — CALCIUM GLUCONATE-NACL 1-0.675 GM/50ML-% IV SOLN
1.0000 g | Freq: Once | INTRAVENOUS | Status: AC
  Administered 2021-02-26: 1000 mg via INTRAVENOUS
  Filled 2021-02-26: qty 50

## 2021-02-26 MED ORDER — MIDODRINE HCL 5 MG PO TABS
5.0000 mg | ORAL_TABLET | Freq: Three times a day (TID) | ORAL | Status: DC
  Administered 2021-02-26 (×2): 5 mg via ORAL
  Filled 2021-02-26 (×3): qty 1

## 2021-02-26 MED ORDER — VANCOMYCIN VARIABLE DOSE PER UNSTABLE RENAL FUNCTION (PHARMACIST DOSING): Status: DC

## 2021-02-26 MED ORDER — CHLORHEXIDINE GLUCONATE CLOTH 2 % EX PADS: 6.0000 | MEDICATED_PAD | Freq: Every day | CUTANEOUS | Status: DC

## 2021-02-26 MED ORDER — TIMOLOL MALEATE 0.5 % OP SOLN
1.0000 [drp] | Freq: Every day | OPHTHALMIC | Status: DC
  Administered 2021-02-26: 1 [drp] via OPHTHALMIC
  Filled 2021-02-26: qty 5

## 2021-02-26 MED ORDER — EZETIMIBE 10 MG PO TABS
10.0000 mg | ORAL_TABLET | Freq: Every day | ORAL | Status: DC
  Administered 2021-02-26: 10 mg via ORAL
  Filled 2021-02-26 (×2): qty 1

## 2021-02-26 MED ORDER — HEPARIN BOLUS VIA INFUSION
700.0000 [IU] | Freq: Once | INTRAVENOUS | Status: AC
  Administered 2021-02-26: 700 [IU] via INTRAVENOUS
  Filled 2021-02-26: qty 700

## 2021-02-26 MED ORDER — CLOPIDOGREL BISULFATE 75 MG PO TABS
75.0000 mg | ORAL_TABLET | Freq: Every day | ORAL | Status: DC
  Administered 2021-02-26: 75 mg via ORAL
  Filled 2021-02-26: qty 1

## 2021-02-26 MED ORDER — MORPHINE SULFATE (PF) 2 MG/ML IV SOLN
2.0000 mg | Freq: Once | INTRAVENOUS | Status: AC
  Administered 2021-02-26: 2 mg via INTRAVENOUS
  Filled 2021-02-26: qty 1

## 2021-02-26 MED ORDER — SODIUM CHLORIDE 0.9% IV SOLUTION: Freq: Once | INTRAVENOUS | Status: DC

## 2021-02-26 MED ORDER — ATORVASTATIN CALCIUM 20 MG PO TABS: 80.0000 mg | ORAL_TABLET | Freq: Every day | ORAL | Status: DC

## 2021-02-26 MED ORDER — VANCOMYCIN HCL IN DEXTROSE 1-5 GM/200ML-% IV SOLN
1000.0000 mg | Freq: Once | INTRAVENOUS | Status: AC
  Administered 2021-02-26: 1000 mg via INTRAVENOUS
  Filled 2021-02-26: qty 200

## 2021-02-26 MED ORDER — HEPARIN BOLUS VIA INFUSION
1400.0000 [IU] | Freq: Once | INTRAVENOUS | Status: DC
  Filled 2021-02-26: qty 1400

## 2021-02-26 MED ORDER — NOREPINEPHRINE BITARTRATE 1 MG/ML IV SOLN
0.0000 ug/min | INTRAVENOUS | Status: DC
  Administered 2021-02-26: 40 ug/min via INTRAVENOUS
  Administered 2021-02-26: 100 ug/min via INTRAVENOUS
  Administered 2021-02-26 – 2021-02-27 (×3): 150 ug/min via INTRAVENOUS
  Filled 2021-02-26 (×6): qty 16

## 2021-02-26 MED ORDER — DELFLEX-LC/1.5% DEXTROSE 344 MOSM/L IP SOLN
INTRAPERITONEAL | Status: DC
  Filled 2021-02-26: qty 3000

## 2021-02-26 MED ORDER — MAGNESIUM SULFATE IN D5W 1-5 GM/100ML-% IV SOLN
1.0000 g | Freq: Once | INTRAVENOUS | Status: AC
  Administered 2021-02-26: 1 g via INTRAVENOUS
  Filled 2021-02-26: qty 100

## 2021-02-26 MED ORDER — VASOPRESSIN 20 UNITS/100 ML INFUSION FOR SHOCK
0.0000 [IU]/min | INTRAVENOUS | Status: DC
  Administered 2021-02-26 (×2): 0.04 [IU]/min via INTRAVENOUS
  Filled 2021-02-26 (×4): qty 100

## 2021-02-26 NOTE — Progress Notes (Addendum)
NAME:  Ray Lamb, MRN:  [id number], DOB:  10-Nov-1930, LOS: 1 ADMISSION DATE:  02/17/2021, INITIAL CONSULTATION DATE:  02/26/21 REFERRING MD:  Dr. Cheri Fowler, CHIEF COMPLAINT:  Weakness   Brief Patient Description  88 yo M presenting to [facility] ED from home where he lives with his wife due to complaints of weakness and worsening lethargy in the setting of recent COVID-19 infection. Per wife and patient bedside they were both positive for COVID-19 on 02/11/21 and treated outpatient with molnipirovir, prednisone and tessalon perles. Since that time the patient's energy levels have decreased and he has been less active. The patient's wife reports very poor PO intake over the last 2 weeks. The patient denies chest pain, loss of consciousness, bleeding/dark stools or abdominal pain. He does report weakness, nausea with dry heaves, mild shortness of breath and a productive cough with clear sputum.  ED course: Patient initially appeared dehydrated, then with lactic acidosis & hypotension, he was worked up per sepsis protocol. Elevated troponin caused Dr. Cheri Fowler to discuss the patient with cardiologist Dr. Corky Sox who recommended heparin drip & ASA per handoff report. Medications given: Ceftriaxone & Azithromycin, ASA & heparin drip started (per cards recs), 3 L IVF bolus & levophed drip initiated Initial Vitals: RR-16, HR- 96, hypotensive- 88/62 & SpO2 100 % on RA. (initially VSS with the exception of hypotension, 3 hours later patient mildly tachypneic with RR 23 & severely hypotensive at 63/45) Chemistry: Na+:130, K+: 3.2, BUN/Cr.: 55/8.41, Serum CO2/ AG: 24/17, albumin: 2.2 Hematology: WBC: 15.7, Hgb: 7.8,  Troponin: 169> 590, BNP: 1508, Lactic/ PCT: 2.5 > 2.4/ 0.94,  COVID-19: positive Influenza A/B: negative   PCCM consulted for admission due to suspected septic shock in the setting of COVID-19 pneumonia requiring vasopressor support.  Pertinent  Medical History  CAD s/p CABG x 4 (2003) HFrEF (LVEF 35-40%  with akinesis of the anteroseptal & apical myocardium- 09/2017) ESRD on PD T2DM Late onset Alzheimer's HTN HLD CVA (2011) Hiatal hernia Remote former Smoker (quit 1968)  Significant Hospital Events: Including procedures, antibiotic start and stop dates in addition to other pertinent events   02/28/2021: Admit to ICU with multifactorial shock in the setting of failure to thrive & suspected sepsis secondary to suspected COVID-19 pneumonia requiring high-dose vasopressor support.  Cultures:  1/14: SARS-CoV-2 PCR>> POSITIVE tested on 12/24 1/14: Influenza PCR>> negative 1/14: Blood culture x2>>No growth thus far 1/14: Urine>> 1/14: MRSA PCR>> POSITIVE  Antimicrobials:  1/13 Azithromycin >> x 1 1/13 Ceftriaxone >> x 1 1/14 Zosyn >> 1/14 Vancomycin >> Interim History / Subjective:  -Remains hypotensive requiring vasopressor.  -PD tonight per Nephro  OBJECTIVE   Blood pressure (!) 44/17, pulse 70, resp. rate (!) 23, height 5\' 6"  (1.676 m), weight 47.6 kg, SpO2 100 %.        Intake/Output Summary (Last 24 hours) at 02/26/2021 1008 Last data filed at 02/26/2021 0914 Gross per 24 hour  Intake 951.8 ml  Output --  Net 951.8 ml   Filed Weights   02/16/2021 1748  Weight: 47.6 kg    Examination: GENERAL: 44 age-year-old patient lying in the bed with no acute distress.  EYES: Pupils equal, round, reactive to light and accommodation. No scleral icterus. Extraocular muscles intact.  HEENT: Head atraumatic, normocephalic. Oropharynx and nasopharynx clear.  NECK:  Supple, no jugular venous distention. No thyroid enlargement, no tenderness.  LUNGS: Normal breath sounds bilaterally, no wheezing, rales,rhonchi or crepitation. No use of accessory muscles of respiration.  CARDIOVASCULAR: S1, S2 normal.  No murmurs, rubs, or gallops.  ABDOMEN: Soft, nontender, nondistended. Bowel sounds present. No organomegaly or mass.  EXTREMITIES: Trace edema of lower extremities , cyanosis, or clubbing.   NEUROLOGIC: Cranial nerves II through XII are intact. Muscle strength 5/5 in all extremities. Sensation intact. Gait not checked.  PSYCHIATRIC: The patient is alert and oriented x 3.  SKIN: No obvious rash, lesion, or ulcer.   Labs/imaging that I havepersonally reviewed  (right click and "Reselect all SmartList Selections" daily)  EKG Interpretation: Date: 03/04/2021, EKG Time: 2143, Rate: 76, Rhythm: NSR, QRS Axis: LAD, Intervals: Prolonged PR interval, ST/T Wave abnormalities: non-specific T wave inversions, minimal ST depression in lead II/ V4/V5, Narrative Interpretation: NSR with prolonged PR interval  Labs   CBC: Recent Labs  Lab 03/03/2021 1750 02/26/21 0432  WBC 15.7* 17.4*  NEUTROABS 9.8*  --   HGB 7.8* 6.5*  HCT 23.9* 20.2*  MCV 96.4 97.1  PLT 272 161    Basic Metabolic Panel: Recent Labs  Lab 03/04/2021 1750 02/26/21 0432  NA 130* 131*  K 3.2* 3.8  CL 89* 99  CO2 24 19*  GLUCOSE 180* 229*  BUN 55* 52*  CREATININE 8.41* 7.59*  CALCIUM 7.5* 6.2*  MG 1.8 1.6*  PHOS 4.6 5.3*   GFR: Estimated Creatinine Clearance: 4.5 mL/min (A) (by C-G formula based on SCr of 7.59 mg/dL (H)). Recent Labs  Lab 03/14/2021 1750 03/03/2021 1751 03/10/2021 2017 02/26/21 0432  PROCALCITON 0.94  --   --  0.77  WBC 15.7*  --   --  17.4*  LATICACIDVEN  --  2.5* 2.4* 4.0*    Liver Function Tests: Recent Labs  Lab 02/22/2021 1750 02/26/21 0432  AST 19 441*  ALT 9 287*  ALKPHOS 74 110  BILITOT 0.9 1.4*  PROT 7.0 5.6*  ALBUMIN 2.2* 2.3*   Recent Labs  Lab 02/22/2021 1750 02/26/21 0230 02/26/21 0432  LIPASE 32  --  28  AMYLASE  --  63  --    No results for input(s): AMMONIA in the last 168 hours.  ABG    Component Value Date/Time   PHART 7.48 (H) 10/10/2017 0500   PCO2ART 45 10/10/2017 0500   PO2ART 95 10/10/2017 0500   HCO3 18.3 (L) 02/26/2021 0528   TCO2 24 03/12/2019 1212   ACIDBASEDEF 7.8 (H) 02/26/2021 0528   O2SAT 31.4 02/26/2021 0528     Coagulation  Profile: Recent Labs  Lab 03/10/2021 2348  INR 1.3*    Cardiac Enzymes: No results for input(s): CKTOTAL, CKMB, CKMBINDEX, TROPONINI in the last 168 hours.  HbA1C: Hemoglobin A1C  Date/Time Value Ref Range Status  02/23/2020 12:00 AM 7.8  Final  11/20/2019 12:00 AM 6.1  Final   HB A1C (BAYER DCA - WAIVED)  Date/Time Value Ref Range Status  01/24/2021 03:28 PM 5.9 (H) 4.8 - 5.6 % Final    Comment:             Prediabetes: 5.7 - 6.4          Diabetes: >6.4          Glycemic control for adults with diabetes: <7.0   10/26/2020 03:04 PM 5.3 4.8 - 5.6 % Final    Comment:             Prediabetes: 5.7 - 6.4          Diabetes: >6.4          Glycemic control for adults with diabetes: <7.0               **  Please note reference interval change**     CBG: Recent Labs  Lab 02/26/21 0111 02/26/21 0833  GLUCAP 158* 168*    Allergies Allergies  Allergen Reactions   Bee Venom Hives   Iodinated Contrast Media Rash   Metrizamide Rash   Other Rash and Other (See Comments)    Dye     Home Medications  Prior to Admission medications   Medication Sig Start Date End Date Taking? Authorizing Provider  ASPIRIN 81 PO Take by mouth every other day.    Yes [provider]  atorvastatin (LIPITOR) 80 MG tablet Take 1 tablet (80 mg total) by mouth at bedtime. 01/24/21  Yes Johnson, Megan P, DO  benzonatate (TESSALON) 200 MG capsule Take 1 capsule (200 mg total) by mouth 2 (two) times daily as needed for cough. 02/17/21  Yes Johnson, Megan P, DO  carvedilol (COREG) 6.25 MG tablet Take 6.25 mg by mouth 2 (two) times daily. 10/14/20  Yes [provider]  clopidogrel (PLAVIX) 75 MG tablet Take 1 tablet (75 mg total) by mouth daily. 06/25/20  Yes Johnson, Megan P, DO  ezetimibe (ZETIA) 10 MG tablet Take 1 tablet (10 mg total) by mouth daily. 01/24/21  Yes Johnson, Megan P, DO  isosorbide mononitrate (IMDUR) 30 MG 24 hr tablet Take 1.5 tablets (45 mg total) by mouth daily. 01/24/21   Yes Johnson, Megan P, DO  levocetirizine (XYZAL) 5 MG tablet Take 1 tablet (5 mg total) by mouth every evening. 06/25/20  Yes Johnson, Megan P, DO  Melatonin 10 MG TABS Take 10 mg by mouth at bedtime.   Yes [provider]  mirtazapine (REMERON) 15 MG tablet Take 15 mg by mouth daily. 10/05/20  Yes [provider]  Multiple Vitamins-Minerals (CENTRUM SILVER ULTRA MENS PO) Take by mouth daily.   Yes [provider]  sitaGLIPtin (JANUVIA) 25 MG tablet Take 1 tablet (25 mg total) by mouth daily. 01/24/21  Yes Johnson, Megan P, DO  timolol (TIMOPTIC) 0.5 % ophthalmic solution Place 1 drop into both eyes daily.    Yes [provider]  torsemide (DEMADEX) 20 MG tablet Take 40 mg by mouth daily. 12/10/19  Yes [provider]  TURMERIC PO Take 1,300 mg by mouth daily.    Yes [provider]  Lancets (ONETOUCH ULTRASOFT) lancets USE TO CHECK BLOOD SUGAR TWICE A DAY 08/01/19   Johnson, Megan P, DO  midodrine (PROAMATINE) 5 MG tablet TAKE 1.0 TABLET ORAL THREE TIMES A DAY WITH MEAL 09/08/20   [provider]  nystatin (MYCOSTATIN/NYSTOP) powder APPLY 1 APPLICATION TOPICALLY 3 (THREE) TIMES DAILY. 11/19/20   Johnson, Megan P, DO  ondansetron (ZOFRAN) 4 MG tablet Take 4 mg by mouth every 8 (eight) hours as needed. 02/17/19   [provider]  Scheduled Meds:  sodium chloride   Intravenous Once   atorvastatin  80 mg Oral QHS   clopidogrel  75 mg Oral Daily   ezetimibe  10 mg Oral Daily   insulin aspart  0-6 Units Subcutaneous Q4H   midodrine  5 mg Oral TID WC   pantoprazole (PROTONIX) IV  40 mg Intravenous QHS   timolol  1 drop Both Eyes Daily   Continuous Infusions:  albumin human 60 mL/hr at 02/26/21 0914   heparin 600 Units/hr (02/26/21 0914)   norepinephrine (LEVOPHED) Adult infusion 40 mcg/min (02/26/21 0305)   piperacillin-tazobactam (ZOSYN)  IV Stopped (02/26/21 0458)   vasopressin 0.04 Units/min (02/26/21 0914)   PRN  Meds:.acetaminophen, docusate sodium,  ondansetron (ZOFRAN) IV, polyethylene glycol  ASSESSMENT & PLAN  Sepsis with multifactorial shock due to suspected CAP in the setting of recent COVID-19 infection & dehydration from failure to thrive complicated by HFrEF -Supplemental oxygen as needed, to maintain SpO2 > 90% -f/u cultures, trend lactic/ PCT -Daily CBC, monitor WBC/ fever curve -IV antibiotics: zosyn & vancomycin  -cautious IVF hydration due to HFrEF -Continue vasopressors to maintain MAP> 65, norepinephrine    Acute on chronic HFrEF (LVEF 35-40% with akinesis of the anteroseptal & apical myocardium- 09/2017) Elevated Troponin secondary to N-STEMI vs demand ischemia Hypertension Hx: CAD s/p CABG x 4 (2003), HLD  -Continuous cardiac monitoring -Maintain MAP greater than 65 -Trend troponins  -continue outpatient midodrine -continue outpatient atorvastatin, Zetia & Plavix -heparin drip per pharmacy consult -Hold Imdur, coreg, torsemide -Cardiology following, appreciate input per cardiology not a candidate for intervention -Repeat 2D Echocardiogram   ESRD on PD Hypoalbuminemia in the setting of malnutrition & failure to thrive Hypokalemia Hyponatremia -Monitor I&O's / urinary output -Follow BMP -Ensure adequate renal perfusion -Avoid nephrotoxic agents as able -Replace electrolytes as indicated -Nephrology following, will start PD tonight    Diabetes mellitus -CBGs -Sliding scale insulin -Follow ICU hyper/hypoglycemia protocol -Hold home Meds    Chronic Anemia in the setting of Chronic Disease PMHx: ESRD S/p 1 unit PRBCs -Monitor for s/s of bleeding -Daily CBC -Transfuse for Hgb <7   Late onset Alzheimer's disease without behavioral disturbance -Falls precautions -supportive care -consult to palliative care to address ongoing GOC in the setting of failure to thrive -consult to [facility] to assist with discharge needs    Best practice (right click and "Reselect all  SmartList Selections" daily)  Diet:  Oral Pain/Anxiety/Delirium protocol (if indicated): No VAP protocol (if indicated): Not indicated DVT prophylaxis: Systemic AC GI prophylaxis: PPI Glucose control:  SSI Yes Central venous access:  Yes, and it is still needed Arterial line:  Yes, and it is still needed Foley:  N/A Mobility:  bed rest  PT consulted: N/A Last date of multidisciplinary goals of care discussion [1/14] Code Status:  full code Disposition: DNR  Critical care time: [place], DNP, FNP-C, AGACNP-BC Acute Care Nurse Practitioner  [facility] Pager: [phone] [place] [facility]

## 2021-02-26 NOTE — Procedures (Signed)
Central Venous Catheter Insertion Procedure Note  Ray Lamb  [id number]  July 16, 1930  Date:02/26/21  Time:12:43 AM   Provider Performing:Jakyia Gaccione L Rust-Chester   Procedure: Insertion of Non-tunneled Central Venous [phone]) with US guidance (82518)   Indication(s) Medication administration  Consent Risks of the procedure as well as the alternatives and risks of each were explained to the patient and/or caregiver.  Consent for the procedure was obtained and is signed in the bedside chart  Anesthesia Topical only with 1% lidocaine   Timeout Verified patient identification, verified procedure, site/side was marked, verified correct patient position, special equipment/implants available, medications/allergies/relevant history reviewed, required imaging and test results available.  Sterile Technique Maximal sterile technique including full sterile barrier drape, hand hygiene, sterile gown, sterile gloves, mask, hair covering, sterile ultrasound probe cover (if used).  Procedure Description Area of catheter insertion was cleaned with chlorhexidine and draped in sterile fashion.  With real-time ultrasound guidance a central venous catheter was placed into the right femoral vein. Nonpulsatile blood flow and easy flushing noted in all ports.  The catheter was sutured in place and sterile dressing applied.  Complications/Tolerance None; patient tolerated the procedure well. Chest X-ray is ordered to verify placement for internal jugular or subclavian cannulation.   Chest x-ray is not ordered for femoral cannulation.  EBL Minimal  Specimen(s) None   Venetia Night, AGACNP-BC Acute Care Nurse Practitioner [facility]   [phone] / [phone] Please see Amion for pager details.

## 2021-02-26 NOTE — Progress Notes (Signed)
Pharmacy Electrolyte Monitoring Consult:  Pharmacy consulted to assist in monitoring and replacing electrolytes in this 88 y.o. male admitted on 03/09/2021 with Weakness -recent Covid (02/11/21 and treated outpatient with molnipirovir)  Labs:  Sodium (mmol/L)  Date Value  02/26/2021 131 (L)  01/24/2021 133 (L)   Potassium (mmol/L)  Date Value  02/26/2021 3.8   Magnesium (mg/dL)  Date Value  02/26/2021 1.6 (L)   Phosphorus (mg/dL)  Date Value  02/26/2021 5.3 (H)   Calcium (mg/dL)  Date Value  02/26/2021 6.2 (LL)   Albumin (g/dL)  Date Value  02/26/2021 2.3 (L)  01/24/2021 3.0 (L)    Assessment/Plan: Peritoneal Dialysis patient -Mag 1.6,  Will order Magnesium sulfate 1 gm IV x 1 -Calcium 6.2  alb 2.3, Corrected Ca=7.6.  NP ordered Calcium Gluconate 1 gm IV x1 -f/u electrolytes with am labs    Ray Lamb A 02/26/2021 10:03 AM

## 2021-02-26 NOTE — Progress Notes (Signed)
ANTICOAGULATION CONSULT NOTE  Pharmacy Consult for heparin infusion Indication: ACS/STEMI  Allergies  Allergen Reactions   Bee Venom Hives   Iodinated Contrast Media Rash   Metrizamide Rash   Other Rash and Other (See Comments)    Dye    Patient Measurements: Height: 5\' 6"  (167.6 cm) Weight: 47.6 kg (105 lb) IBW/kg (Calculated) : 63.8 Heparin Dosing Weight: 47.6 kg  Vital Signs: Temp: 96.5 F (35.8 C) (01/14 2000) Temp Source: Axillary (01/14 2000) BP: 88/17 (01/14 2300) Pulse Rate: 80 (01/14 1800)  Labs: Recent Labs    02/21/2021 1750 03/11/2021 2017 02/22/2021 2348 02/26/21 0230 02/26/21 0432 02/26/21 0623 02/26/21 1022 02/26/21 2113  HGB 7.8*  --   --   --  6.5*  --   --   --   HCT 23.9*  --   --   --  20.2*  --   --   --   PLT 272  --   --   --  211  --   --   --   APTT  --   --  30  --   --   --   --   --   LABPROT  --   --  16.1*  --   --   --   --   --   INR  --   --  1.3*  --   --   --   --   --   HEPARINUNFRC  --   --   --   --   --   --  0.17* 0.31  CREATININE 8.41*  --   --   --  7.59*  --   --   --   TROPONINIHS 169*   < > 1,880* 4,676* 6,370* 9,047*  --   --    < > = values in this interval not displayed.     Estimated Creatinine Clearance: 4.5 mL/min (A) (by C-G formula based on SCr of 7.59 mg/dL (H)).   Medical History: Past Medical History:  Diagnosis Date   Anemia    Chronic kidney disease    STAGE 4   Coronary atherosclerosis    CABG X 4   Diabetes mellitus without complication (HCC)    Edema    ankle   GERD (gastroesophageal reflux disease)    Glaucoma    Hearing loss    Heart murmur    HFrEF (heart failure with reduced ejection fraction) (HCC)    LVEF 35-40%, a[place] of the anteroseptal & apical   History of hiatal hernia    Hyperlipidemia    Hypertension    Late onset Alzheimer dementia ([place])    Mini stroke    Mitral insufficiency    Myocardial infarction (HCC)    mild, heart cath done no stents needed   Stroke [facility]) 2011    TIA    Assessment: Pt is 88 yo male w/ ESRD on PD, presenting to ED c/o increasing weakness/lethargy following recent COVID infection found with elevated Troponin I, trending up.  1/14 1022  HL=0.17   Subtherapeutic- inc rate to 750 u/hr -Hgb 6.5, PRBC transfusion scheduled per Nephrology note. F/u 1/14 2113 HL=0.31  Therapeutic x 1  Goal of Therapy:  Heparin level 0.3-0.7 units/ml Monitor platelets by anticoagulation protocol: Yes   Plan:  Continue heparin infusion to 750 units/hr Recheck HL w/ AM labs to confirm CBC daily while on heparin  Renda Rolls, PharmD, [facility] 02/26/2021 11:31 PM

## 2021-02-26 NOTE — Consult Note (Signed)
[place] Nurse Consult Note: Reason for Consult: Stage 2 pressure injury to coccyx Wound type:Pressure Pressure Injury POA: Yes Measurement:to be obtained by Bedside RN, Lenna Sciara Lomax today and documented on Nursing Flow Sheet.  Discussed with her via [facility]. Wound CHY:IFOY, moist Drainage (amount, consistency, odor) scant serous Periwound: intact Dressing procedure/placement/frequency: Orders have been provided by Domingo Pulse Rust-Chester, NP for topical care and I am in agreement with those Orders. I have transposed them into Wound Care Topical Care Orders for ease in application and provided Kellie Simmering #s to ensure no difficulty in obtaining supplies.I have additionally provided bilateral pressure redistribution heel boots to prevent pressure injury to the heels.  [place] nursing team will not follow, but will remain available to this patient, the nursing and medical teams.  Please re-consult if needed. Thanks, Maudie Flakes, MSN, RN, [place], Ar[facility] Abbott  Pager# [phone]

## 2021-02-26 NOTE — Progress Notes (Signed)
Pharmacy Antibiotic Note  ROXY FILLER is a 88 y.o. male w/ ERSD on nightly peritoneal dialysis, admitted on 02/13/2021 with sepsis.  Pharmacy has been consulted for Zosyn and Vancomycin dosing.  Plan: Zosyn 2.25 g q8h per indication and ESRD PD status  Vancomycin Order LD of Vancomycin 1 gm per pt wt: 47.6 kg.  Pharmacy will continue to follow and adjust and/or place additional orders as warranted.  Height: 5\' 6"  (167.6 cm) Weight: 47.6 kg (105 lb) IBW/kg (Calculated) : 63.8  No data recorded.  Recent Labs  Lab 03/14/2021 1750 03/09/2021 1751 03/09/2021 2017  WBC 15.7*  --   --   CREATININE 8.41*  --   --   LATICACIDVEN  --  2.5* 2.4*    Estimated Creatinine Clearance: 4.1 mL/min (A) (by C-G formula based on SCr of 8.41 mg/dL (H)).    Allergies  Allergen Reactions   Bee Venom Hives   Iodinated Contrast Media Rash   Metrizamide Rash   Other Rash and Other (See Comments)    Dye    Antimicrobials this admission: 1/13 Azithromycin >> x 1 1/13 Ceftriaxone >> x 1 1/14 Zosyn >> 1/14 Vancomycin >>  Microbiology results: 01/13 BCx: Pending 01/14 Resp Cx: Pending  01/14 BF Cx: Pending  01/14 MRSA PCR: Detected  Thank you for allowing pharmacy to be a part of this patients care.  Renda Rolls, PharmD, [facility] 02/26/2021 7:23 AM

## 2021-02-26 NOTE — Progress Notes (Addendum)
ANTICOAGULATION CONSULT NOTE  Pharmacy Consult for heparin infusion Indication: ACS/STEMI  Allergies  Allergen Reactions   Bee Venom Hives   Iodinated Contrast Media Rash   Metrizamide Rash   Other Rash and Other (See Comments)    Dye    Patient Measurements: Height: 5\' 6"  (167.6 cm) Weight: 47.6 kg (105 lb) IBW/kg (Calculated) : 63.8 Heparin Dosing Weight: 47.6 kg  Vital Signs: BP: 95/36 (01/14 1100) Pulse Rate: 66 (01/14 1100)  Labs: Recent Labs    03/11/2021 1750 02/15/2021 2017 03/08/2021 2348 02/26/21 0230 02/26/21 0432 02/26/21 0623 02/26/21 1022  HGB 7.8*  --   --   --  6.5*  --   --   HCT 23.9*  --   --   --  20.2*  --   --   PLT 272  --   --   --  211  --   --   APTT  --   --  30  --   --   --   --   LABPROT  --   --  16.1*  --   --   --   --   INR  --   --  1.3*  --   --   --   --   HEPARINUNFRC  --   --   --   --   --   --  0.17*  CREATININE 8.41*  --   --   --  7.59*  --   --   TROPONINIHS 169*   < > 1,880* 4,676* 6,370* 9,047*  --    < > = values in this interval not displayed.     Estimated Creatinine Clearance: 4.5 mL/min (A) (by C-G formula based on SCr of 7.59 mg/dL (H)).   Medical History: Past Medical History:  Diagnosis Date   Anemia    Chronic kidney disease    STAGE 4   Coronary atherosclerosis    CABG X 4   Diabetes mellitus without complication (HCC)    Edema    ankle   GERD (gastroesophageal reflux disease)    Glaucoma    Hearing loss    Heart murmur    HFrEF (heart failure with reduced ejection fraction) (HCC)    LVEF 35-40%, [place] of the anteroseptal & apical   History of hiatal hernia    Hyperlipidemia    Hypertension    Late onset Alzheimer dementia ([place])    Mini stroke    Mitral insufficiency    Myocardial infarction (HCC)    mild, heart cath done no stents needed   Stroke [facility]) 2011   TIA    Assessment: Pt is 88 yo male w/ ESRD on PD, presenting to ED c/o increasing weakness/lethargy following recent COVID  infection found with elevated Troponin I, trending up.  1/14 1022  HL=0.17   Subtherapeutic- inc rate to 750 u/hr   Goal of Therapy:  Heparin level 0.3-0.7 units/ml Monitor platelets by anticoagulation protocol: Yes   Plan:  Bolus 700 units x 1  (half bolus d/t Hgb) Increase heparin infusion to 750 units/hr -Hgb 6.5, PRBC transfusion scheduled per Nephrology note. F/u reCheck HL in 8 hrs after rate change CBC daily while on heparin  Chinita Greenland PharmD Clinical Pharmacist 02/26/2021

## 2021-02-26 NOTE — Progress Notes (Signed)
eLink Physician-Brief Progress Note Patient Name: Ray Lamb DOB: Apr 02, 1930 MRN: [id number]   Date of Service  02/26/2021  HPI/Events of Note  Patient with recent Covid 19 infection admitted with weakness, failure to thrive, and found to have hypotension r/o sepsis, patient has a history of ESRD on home peritoneal dialysis.  eICU Interventions  New Patient Evaluation.        Kerry Kass Najeeb Uptain 02/26/2021, 1:52 AM

## 2021-02-26 NOTE — Progress Notes (Signed)
[facility]  ROUNDING NOTE   Subjective:   Mr. CAIDENCE HIGASHI was admitted to [facility] on 02/22/2021 for Septic shock ([place]) [A41.9, R65.21] Community acquired pneumonia, unspecified laterality [J18.9] Sepsis without acute organ dysfunction, due to unspecified organism [facility]) [A41.9]  Last peritoneal dialysis was the night of 1/12.   Patient was diagnosed with COVID-19 infection around [place]. He was treated with molnipirovir and steroids. Patient states he has been getting progressively weaker with lethargy and fatigue. Patient has not been eating well. COmplains of nausea, vomiting and poor appetite. Patient has a productive cough with clear sputum. Denies any home fevers.   Home peritoneal dialysis is administered by wife who is at bedside. She states dialysis has been going well. Patient has been having more hypotension. They have been using a mix of 2.5% and 1.5% dextrose at home. He has been recently prescribed midodrine.    Objective:  Vital signs in last 24 hours:  Pulse Rate:  [69-96] 70 (01/14 0945) Resp:  [16-29] 23 (01/14 0945) BP: (44-109)/(15-70) 44/17 (01/14 0945) SpO2:  [91 %-100 %] 100 % (01/14 0945) Weight:  [47.6 kg] 47.6 kg (01/13 1748)  Weight change:  Filed Weights   02/19/2021 1748  Weight: 47.6 kg    Intake/Output: I/O last 3 completed shifts: In: 495.8 [I.V.:495.8] Out: -    Intake/Output this shift:  Total I/O In: 456 [I.V.:133.9; IV Piggyback:322.1] Out: -   Physical Exam: General: Ill appearing  Head: Normocephalic, atraumatic. dry oral mucosal membranes  Eyes: Anicteric, PERRL  Neck: Supple, trachea midline  Lungs:  Clear to auscultation  Heart: Regular rate and rhythm  Abdomen:  Soft, nontender,   Extremities:  no peripheral edema.  Neurologic: Nonfocal, moving all four extremities  Skin: No lesions  Access: +PD catheter    Basic Metabolic Panel: Recent Labs  Lab 03/02/2021 1750 02/26/21 0432  NA 130* 131*  K 3.2* 3.8  CL  89* 99  CO2 24 19*  GLUCOSE 180* 229*  BUN 55* 52*  CREATININE 8.41* 7.59*  CALCIUM 7.5* 6.2*  MG 1.8 1.6*  PHOS 4.6 5.3*    Liver Function Tests: Recent Labs  Lab 02/14/2021 1750 02/26/21 0432  AST 19 441*  ALT 9 287*  ALKPHOS 74 110  BILITOT 0.9 1.4*  PROT 7.0 5.6*  ALBUMIN 2.2* 2.3*   Recent Labs  Lab 03/01/2021 1750 02/26/21 0230 02/26/21 0432  LIPASE 32  --  28  AMYLASE  --  63  --    No results for input(s): AMMONIA in the last 168 hours.  CBC: Recent Labs  Lab 03/09/2021 1750 02/26/21 0432  WBC 15.7* 17.4*  NEUTROABS 9.8*  --   HGB 7.8* 6.5*  HCT 23.9* 20.2*  MCV 96.4 97.1  PLT 272 211    Cardiac Enzymes: No results for input(s): CKTOTAL, CKMB, CKMBINDEX, TROPONINI in the last 168 hours.  BNP: Invalid input(s): POCBNP  CBG: Recent Labs  Lab 02/26/21 0111 02/26/21 0833  GLUCAP 158* 168*    Microbiology: Results for orders placed or performed during the hospital encounter of 02/26/2021  Resp Panel by RT-PCR (Flu A&B, Covid) Nasopharyngeal Swab     Status: Abnormal   Collection Time: 03/09/2021  8:17 PM   Specimen: Nasopharyngeal Swab; Nasopharyngeal(NP) swabs in vial transport medium  Result Value Ref Range Status   SARS Coronavirus 2 by RT PCR POSITIVE (A) NEGATIVE Final    Comment: (NOTE) SARS-CoV-2 target nucleic acids are DETECTED.  The SARS-CoV-2 RNA is generally detectable  in upper respiratory specimens during the acute phase of infection. Positive results are indicative of the presence of the identified virus, but do not rule out bacterial infection or co-infection with other pathogens not detected by the test. Clinical correlation with patient history and other diagnostic information is necessary to determine patient infection status. The expected result is Negative.  Fact Sheet for Patients: EntrepreneurPulse.com.au  Fact Sheet for Healthcare Providers: IncredibleEmployment.be  This test is not  yet approved or cleared by the [place] FDA and  has been authorized for detection and/or diagnosis of SARS-CoV-2 by FDA under an Emergency Use Authorization (EUA).  This EUA will remain in effect (meaning this test can be used) for the duration of  the COVID-19 declaration under Section 564(b)(1) of the A ct, 21 U.S.C. section 360bbb-3(b)(1), unless the authorization is terminated or revoked sooner.     Influenza A by PCR NEGATIVE NEGATIVE Final   Influenza B by PCR NEGATIVE NEGATIVE Final    Comment: (NOTE) The Xpert Xpress SARS-CoV-2/FLU/RSV plus assay is intended as an aid in the diagnosis of influenza from Nasopharyngeal swab specimens and should not be used as a sole basis for treatment. Nasal washings and aspirates are unacceptable for Xpert Xpress SARS-CoV-2/FLU/RSV testing.  Fact Sheet for Patients: EntrepreneurPulse.com.au  Fact Sheet for Healthcare Providers: IncredibleEmployment.be  This test is not yet approved or cleared by the [place] FDA and has been authorized for detection and/or diagnosis of SARS-CoV-2 by FDA under an Emergency Use Authorization (EUA). This EUA will remain in effect (meaning this test can be used) for the duration of the COVID-19 declaration under Section 564(b)(1) of the Act, 21 U.S.C. section 360bbb-3(b)(1), unless the authorization is terminated or revoked.  Performed at [facility], [address] [place], [place] [address]   Blood culture (routine x 2)     Status: None (Preliminary result)   Collection Time: 02/26/2021  8:18 PM   Specimen: BLOOD  Result Value Ref Range Status   Specimen Description BLOOD BLOOD RIGHT FOREARM  Final   Special Requests   Final    BOTTLES DRAWN AEROBIC AND ANAEROBIC Blood Culture results may not be optimal due to an excessive volume of blood received in culture bottles   Culture   Final    NO GROWTH < 12 HOURS Performed at [facility],  [address], [place], [place] [address]    Report Status PENDING  Incomplete  Blood culture (routine x 2)     Status: None (Preliminary result)   Collection Time: 02/18/2021  8:18 PM   Specimen: BLOOD  Result Value Ref Range Status   Specimen Description BLOOD BLOOD RIGHT HAND  Final   Special Requests   Final    BOTTLES DRAWN AEROBIC ONLY Blood Culture results may not be optimal due to an excessive volume of blood received in culture bottles   Culture   Final    NO GROWTH < 12 HOURS Performed at [facility], [address] [place],  [address]    Report Status PENDING  Incomplete  MRSA Next Gen by PCR, Nasal     Status: Abnormal   Collection Time: 02/26/21  4:32 AM   Specimen: Nasal Mucosa; Nasal Swab  Result Value Ref Range Status   MRSA by PCR Next Gen DETECTED (A) NOT DETECTED Final    Comment: RESULT CALLED TO, READ BACK BY AND VERIFIED WITH: KARRIE STEFANOS @0601  02/26/2021 TTG (NOTE) The GeneXpert MRSA Assay (FDA approved for NASAL specimens  only), is one component of a comprehensive MRSA colonization surveillance program. It is not intended to diagnose MRSA infection nor to guide or monitor treatment for MRSA infections. Test performance is not FDA approved in patients less than 40 years old. Performed at [facility], [address] [place], [place] [address]   Expectorated Sputum Assessment w Gram Stain, Rflx to Resp Cult     Status: None   Collection Time: 02/26/21  4:37 AM   Specimen: Expectorated Sputum  Result Value Ref Range Status   Specimen Description EXPECTORATED SPUTUM  Final   Special Requests NONE  Final   Sputum evaluation   Final    THIS SPECIMEN IS ACCEPTABLE FOR SPUTUM CULTURE Performed at [facility], [address], [place], [place] [address]    Report Status 02/26/2021 FINAL  Final    Coagulation Studies: Recent Labs    03/09/2021 2348  LABPROT 16.1*  INR 1.3*    Urinalysis: No results for  input(s): COLORURINE, LABSPEC, PHURINE, GLUCOSEU, HGBUR, BILIRUBINUR, KETONESUR, PROTEINUR, UROBILINOGEN, NITRITE, LEUKOCYTESUR in the last 72 hours.  Invalid input(s): APPERANCEUR    Imaging: CT ABDOMEN PELVIS WO CONTRAST  Result Date: 02/26/2021 CLINICAL DATA:  Sepsis. Pt complains of generalized weakness that has occurred since patient was diagnosed with COVID on December 16. EXAM: CT ABDOMEN AND PELVIS WITHOUT CONTRAST TECHNIQUE: Multidetector CT imaging of the abdomen and pelvis was performed following the standard protocol without IV contrast. RADIATION DOSE REDUCTION: This exam was performed according to the departmental dose-optimization program which includes automated exposure control, adjustment of the mA and/or kV according to patient size and/or use of iterative reconstruction technique. COMPARISON:  CT abdomen pelvis 09/30/2017 FINDINGS: Lower chest: Bibasilar peripheral reticulations and cystic changes likely representing pulmonary fibrosis. Bibasilar atelectasis. Bilateral trace pleural effusions. Cardiac findings suggestive of anemia. Four-vessel coronary calcifications. Aortic valve leaflet calcifications. Large hiatal hernia containing the majority of the stomach as well as the majority of the transverse colon. Hepatobiliary: No focal liver abnormality. Calcified gallstone noted within the gallbladder lumen. Hydropic gallbladder lumen with query hazy gallbladder wall. No definite gallbladder wall thickening or pericholecystic fluid. No biliary dilatation. Pancreas: No focal lesion. Hazy pancreatic body contour with peripancreatic fat stranding. No main pancreatic ductal dilatation. Spleen: Normal in size without focal abnormality. Adrenals/Urinary Tract: No adrenal nodule bilaterally. Bilateral renal cortical scarring and atrophy. No nephrolithiasis and no hydronephrosis. No definite contour-deforming renal mass. No ureterolithiasis or hydroureter. The urinary bladder is unremarkable.  Stomach/Bowel: Stomach is within normal limits. No evidence of bowel wall thickening or dilatation. Appendix appears normal. Vascular/Lymphatic: No abdominal aorta or iliac aneurysm. Severe atherosclerotic plaque of the aorta and its branches. Interval increase in size and number of difficult to measure on this noncontrast study retroperitoneal conglomerative lymphadenopathy measuring at least 3 cm in short axis. Associated interval increase in size and number of mesenteric lymphadenopathy with as an example a 1.2 cm lymph node (2:51). Reproductive: The prostate is enlarged measuring up to 5 cm. Other: Small volume free fluid in the setting of a dialysis peritoneal catheter terminating within the right abdomen. No intraperitoneal free gas. No organized fluid collection. Musculoskeletal: Small free fluid containing left inguinal hernia. No suspicious lytic or blastic osseous lesions. No acute displaced fracture. Multilevel degenerative changes of the spine. IMPRESSION: 1. Cholelithiasis with associated hydropic gallbladder and query hazy gallbladder wall. Recommend right upper quadrant ultrasound for further evaluation. 2. Hazy pancreatic body contour with peripancreatic fat stranding. Correlate with lipase levels for acute pancreatitis.  3. Interval increase in size and number of conglomerative retroperitoneal and mesenteric lymphadenopathy suggestive of lymphoma/lymphoproliferative disorder versus metastatic disease. 4. Prostatomegaly. 5. Large hiatal hernia containing the majority of the stomach and transverse colon. 6. Small fluid containing left inguinal hernia. 7. Markedly limited evaluation on this noncontrast study. 8.  Aortic Atherosclerosis (ICD10-I70.0). Electronically Signed   By: Iven Finn M.D.   On: 02/26/2021 01:27   DG Chest 2 View  Result Date: 02/23/2021 CLINICAL DATA:  Positive COVID test EXAM: CHEST - 2 VIEW COMPARISON:  04/09/2018 FINDINGS: Sternal wires overlie enlarged cardiac  silhouette. Large hiatal hernia noted. Patchy airspace disease in lower lobes. Upper lungs clear. IMPRESSION: Patchy lower lobe airspace disease could indicate viral pneumonia. Large hiatal hernia. Electronically Signed   By: Suzy Bouchard M.D.   On: 03/01/2021 18:20   US Abdomen Limited RUQ (LIVER/GB)  Result Date: 02/26/2021 CLINICAL DATA:  Abdominal pain EXAM: ULTRASOUND ABDOMEN LIMITED RIGHT UPPER QUADRANT COMPARISON:  None. FINDINGS: Gallbladder: Sludge and stones are seen in the lumen. There is wall thickening in the gallbladder measuring up to 6 mm. Technologist did not observe any tenderness over the gallbladder during the study. There is small ascites in the right upper quadrant. Common bile duct: Diameter: 2 mm Liver: No focal abnormality is seen in the visualized portions of liver. Left lobe of liver is not optimally visualized. Small amount of ascites is present in the right upper quadrant. Portal vein is patent on color Doppler imaging with normal direction of blood flow towards the liver. Other: None. IMPRESSION: Stones and sludge are noted within gallbladder. There is wall thickening in gallbladder. This may be due to acute or chronic cholecystitis. Technologist did not observe any focal tenderness over the gallbladder during the study. Please correlate with clinical and laboratory findings. There is no dilation of bile ducts. Small perihepatic ascites is seen. Electronically Signed   By: Elmer Picker M.D.   On: 02/26/2021 08:43     Medications:    albumin human 60 mL/hr at 02/26/21 0914   heparin 600 Units/hr (02/26/21 0914)   magnesium sulfate bolus IVPB     norepinephrine (LEVOPHED) Adult infusion 40 mcg/min (02/26/21 0305)   piperacillin-tazobactam (ZOSYN)  IV Stopped (02/26/21 0458)   vasopressin 0.04 Units/min (02/26/21 0914)    sodium chloride   Intravenous Once   clopidogrel  75 mg Oral Daily   ezetimibe  10 mg Oral Daily   insulin aspart  0-6 Units Subcutaneous  Q4H   midodrine  5 mg Oral TID WC   pantoprazole (PROTONIX) IV  40 mg Intravenous QHS   timolol  1 drop Both Eyes Daily   vancomycin variable dose per unstable renal function (pharmacist dosing)   Does not apply See admin instructions   acetaminophen, docusate sodium, ondansetron (ZOFRAN) IV, polyethylene glycol  Assessment/ Plan:  Mr. SEVYN PAREDEZ is a 88 y.o. white male with end stage renal disease, hypotension, coronary artery disease status post CABG, diabetes mellitus type II, GERD, glaucoma, systolic congestive heart failure, hyperlipidemia, dementia, CVA who is admitted to [facility] on 03/09/2021 for Septic shock ([place]) [A41.9, R65.21] Community acquired pneumonia, unspecified laterality [J18.9] Sepsis without acute organ dysfunction, due to unspecified organism ([place]) [A41.9]  [facility] PD [facility] 57kg CCPD 9 hours 6 exchanges 2 liter fills   End Stage renal disease: on peritoneal dialysis. Missed treatment last night. Orders prepared for tonight. 1.5% dextrose.   Hypotension with sepsis, pneumonia and COVID -19 infection: requiring vasopressors. Placed on midodrine.  Holding home carvedilol, isosorbide mononitrate, and torsemide.  - check PD fluid for peritonitis  Anemia with chronic kidney disease: hemoglobin of 6.5. Mircera as outpatient - PRBC transfusion scheduled.   Secondary Hyperparathyroidism: phosphorus at goal.  - calcium carbonate with meals  Lymphadenopathy on CT: concerning for malignancy. Will need further work up. Bed weight today is 47kg which is 10 kg below his outpatient target weight.     LOS: 1 Trinadee Verhagen 1/14/202310:21 AM

## 2021-02-26 NOTE — Progress Notes (Signed)
GOALS OF CARE DISCUSSION  The Clinical status was relayed to family in detail. Family at bedside Updated and notified of patients medical condition.    Patient remains unresponsive and will not open eyes to command.   Patient is having a weak cough and struggling to remove secretions.   Patient with increased WOB and using accessory muscles to breathe Explained to family course of therapy and the modalities    Patient with Progressive multiorgan failure with a very high probablity of a very minimal chance of meaningful recovery despite all aggressive and optimal medical therapy.   Family understands the situation.  They have consented and agreed to DNR/DNI  Patient received morphine and versed for increased WOB and agitation  Patient is in the dying process  Family are satisfied with Plan of action and management. All questions answered  Additional CC time 25 mins   Ray Lamb Patricia Pesa, M.D.  [facility]  Medical Director [place] Director [facility] Cardio-Pulmonary Department

## 2021-02-26 NOTE — Progress Notes (Signed)
GOALS OF CARE DISCUSSION  The Clinical status was relayed to family in detail. Case discussed with Dr Humphrey Rolls with Cardiology, overall poor prognosis  Updated and notified of patients medical condition.   Patient with Progressive multiorgan failure with a very high probablity of a very minimal chance of meaningful recovery despite all aggressive and optimal medical therapy.   Family understands the situation.  They have consented and agreed to DNR/DNI  Family are satisfied with Plan of action and management. All questions answered  Additional CC time 35 mins   Nesanel Aguila Patricia Pesa, M.D.  [facility]  Medical Director [place] Director [facility] Cardio-Pulmonary Department
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
## 2021-03-01 LAB — TYPE AND SCREEN
Antibody Screen: NEGATIVE
Unit division: 0

## 2021-03-01 LAB — CULTURE, RESPIRATORY W GRAM STAIN

## 2021-03-01 LAB — BPAM RBC
Blood Product Expiration Date: 202302092359
ISSUE DATE / TIME: 202301141152
Unit Type and Rh: 5100

## 2021-03-02 ENCOUNTER — Ambulatory Visit: Payer: Medicare Other | Admitting: Nurse Practitioner

## 2021-03-02 ENCOUNTER — Other Ambulatory Visit: Payer: Medicare Other

## 2021-03-02 LAB — CULTURE, BLOOD (ROUTINE X 2)
Culture: NO GROWTH
Culture: NO GROWTH

## 2021-03-03 ENCOUNTER — Ambulatory Visit: Payer: Medicare Other | Admitting: Nurse Practitioner

## 2021-03-16 NOTE — Death Summary Note (Signed)
DEATH SUMMARY   Patient Details  Name: Ray Lamb MRN: [id number] DOB: October 25, 1930  Admission/Discharge Information   Admit Date:  03-13-2021  Date of Death: Date of Death: 2021/03/15  Time of Death: Time of Death: 0201  Length of Stay: 2  Referring Physician: Valerie Roys, DO   Reason(s) for Hospitalization  Weakness & failure to thrive with circulatory shock requiring vasopressor support  Diagnoses  Preliminary cause of death:  Secondary Diagnoses (including complications and co-morbidities):  Principal Problem:   Septic shock ([place]) Active Problems:   Diabetes ([place])   Alzheimer's dementia ([place])   Mixed hyperlipidemia   ESRD (end stage renal disease) ([place])   Anemia of chronic renal failure   Pressure injury of skin   COVID-19 virus detected   Failure to thrive in adult   [place] (including significant findings, care, treatment, and services provided and events leading to death)  Ray Lamb is a 88 y.o. year old male with past medical history of late onset Alzheimer's, ESRD on peritoneal dialysis, CAD status post CABG who presented to [facility] ED from home on 03-13-2021 where he lives with his wife with complaints of weakness and worsening lethargy in the setting of recent COVID-19 infection on 02/11/2021.  He received outpatient treatment with molnipirovir, prednisone and tessalon perles.  Since that time the patient's energy levels have decreased and he has been less active.  Patient's wife reported very poor p.o. intake over the last 2 weeks and he initially appeared dehydrated.  Subsequently lab work revealed lactic acidosis and correlating severe hypotension he was worked up per sepsis protocol receiving empiric antibiotics IV fluid resuscitation and eventually Levophed drip.  Other significant labs included elevated troponin with concerns for NSTEMI, heparin drip was started and aspirin given in ED. other lab work of note was an albumin of 2.2 mild hyponatremia at  130 hypokalemia 3.2, COVID-19 positive and influenza AB negative.  Chest x-ray revealed patchy lower lobe airspace disease that could indicate a viral pneumonia, CT of the abdomen and pelvis was concerning for acute pancreatitis versus gallstones.  Patient admitted to ICU requiring high-dose vasopressor support with maximum Levophed drip and vasopressin additionally.  Nephrology, cardiology, palliative care, dietary, case management and wound care were consulted on his case.  Patient's hemoglobin dropped to 6.5 in the morning and he received a unit of RBCs.  During the day he remained significantly hemodynamically unstable, prompting plan of care discussions with wife/patient and other family.  The decision was made to adopt DNR/DNI CODE STATUS and not escalate care beyond current interventions which included Levophed drip 150 mcg with the understanding of possible transition to comfort care on 2021-03-15.  Overnight with family bedside patient continued to decline, ultimately passing at 02:01 with wife and grandson present.  Pertinent Labs and Studies  Significant Diagnostic Studies CT ABDOMEN PELVIS WO CONTRAST  Result Date: 02/26/2021 CLINICAL DATA:  Sepsis. Pt complains of generalized weakness that has occurred since patient was diagnosed with COVID on December 16. EXAM: CT ABDOMEN AND PELVIS WITHOUT CONTRAST TECHNIQUE: Multidetector CT imaging of the abdomen and pelvis was performed following the standard protocol without IV contrast. RADIATION DOSE REDUCTION: This exam was performed according to the departmental dose-optimization program which includes automated exposure control, adjustment of the mA and/or kV according to patient size and/or use of iterative reconstruction technique. COMPARISON:  CT abdomen pelvis 09/30/2017 FINDINGS: Lower chest: Bibasilar peripheral reticulations and cystic changes likely representing pulmonary fibrosis. Bibasilar atelectasis. Bilateral trace pleural  effusions. Cardiac  findings suggestive of anemia. Four-vessel coronary calcifications. Aortic valve leaflet calcifications. Large hiatal hernia containing the majority of the stomach as well as the majority of the transverse colon. Hepatobiliary: No focal liver abnormality. Calcified gallstone noted within the gallbladder lumen. Hydropic gallbladder lumen with query hazy gallbladder wall. No definite gallbladder wall thickening or pericholecystic fluid. No biliary dilatation. Pancreas: No focal lesion. Hazy pancreatic body contour with peripancreatic fat stranding. No main pancreatic ductal dilatation. Spleen: Normal in size without focal abnormality. Adrenals/Urinary Tract: No adrenal nodule bilaterally. Bilateral renal cortical scarring and atrophy. No nephrolithiasis and no hydronephrosis. No definite contour-deforming renal mass. No ureterolithiasis or hydroureter. The urinary bladder is unremarkable. Stomach/Bowel: Stomach is within normal limits. No evidence of bowel wall thickening or dilatation. Appendix appears normal. Vascular/Lymphatic: No abdominal aorta or iliac aneurysm. Severe atherosclerotic plaque of the aorta and its branches. Interval increase in size and number of difficult to measure on this noncontrast study retroperitoneal conglomerative lymphadenopathy measuring at least 3 cm in short axis. Associated interval increase in size and number of mesenteric lymphadenopathy with as an example a 1.2 cm lymph node (2:51). Reproductive: The prostate is enlarged measuring up to 5 cm. Other: Small volume free fluid in the setting of a dialysis peritoneal catheter terminating within the right abdomen. No intraperitoneal free gas. No organized fluid collection. Musculoskeletal: Small free fluid containing left inguinal hernia. No suspicious lytic or blastic osseous lesions. No acute displaced fracture. Multilevel degenerative changes of the spine. IMPRESSION: 1. Cholelithiasis with associated hydropic gallbladder and query  hazy gallbladder wall. Recommend right upper quadrant ultrasound for further evaluation. 2. Hazy pancreatic body contour with peripancreatic fat stranding. Correlate with lipase levels for acute pancreatitis. 3. Interval increase in size and number of conglomerative retroperitoneal and mesenteric lymphadenopathy suggestive of lymphoma/lymphoproliferative disorder versus metastatic disease. 4. Prostatomegaly. 5. Large hiatal hernia containing the majority of the stomach and transverse colon. 6. Small fluid containing left inguinal hernia. 7. Markedly limited evaluation on this noncontrast study. 8.  Aortic Atherosclerosis (ICD10-I70.0). Electronically Signed   By: Iven Finn M.D.   On: 02/26/2021 01:27   DG Chest 2 View  Result Date: 03/06/2021 CLINICAL DATA:  Positive COVID test EXAM: CHEST - 2 VIEW COMPARISON:  04/09/2018 FINDINGS: Sternal wires overlie enlarged cardiac silhouette. Large hiatal hernia noted. Patchy airspace disease in lower lobes. Upper lungs clear. IMPRESSION: Patchy lower lobe airspace disease could indicate viral pneumonia. Large hiatal hernia. Electronically Signed   By: Suzy Bouchard M.D.   On: 02/28/2021 18:20   US Abdomen Limited RUQ (LIVER/GB)  Result Date: 02/26/2021 CLINICAL DATA:  Abdominal pain EXAM: ULTRASOUND ABDOMEN LIMITED RIGHT UPPER QUADRANT COMPARISON:  None. FINDINGS: Gallbladder: Sludge and stones are seen in the lumen. There is wall thickening in the gallbladder measuring up to 6 mm. Technologist did not observe any tenderness over the gallbladder during the study. There is small ascites in the right upper quadrant. Common bile duct: Diameter: 2 mm Liver: No focal abnormality is seen in the visualized portions of liver. Left lobe of liver is not optimally visualized. Small amount of ascites is present in the right upper quadrant. Portal vein is patent on color Doppler imaging with normal direction of blood flow towards the liver. Other: None. IMPRESSION: Stones  and sludge are noted within gallbladder. There is wall thickening in gallbladder. This may be due to acute or chronic cholecystitis. Technologist did not observe any focal tenderness over the gallbladder during the study. Please correlate with clinical  and laboratory findings. There is no dilation of bile ducts. Small perihepatic ascites is seen. Electronically Signed   By: Elmer Picker M.D.   On: 02/26/2021 08:43    Microbiology Recent Results (from the past 240 hour(s))  Resp Panel by RT-PCR (Flu A&B, Covid) Nasopharyngeal Swab     Status: Abnormal   Collection Time: 02/18/2021  8:17 PM   Specimen: Nasopharyngeal Swab; Nasopharyngeal(NP) swabs in vial transport medium  Result Value Ref Range Status   SARS Coronavirus 2 by RT PCR POSITIVE (A) NEGATIVE Final    Comment: (NOTE) SARS-CoV-2 target nucleic acids are DETECTED.  The SARS-CoV-2 RNA is generally detectable in upper respiratory specimens during the acute phase of infection. Positive results are indicative of the presence of the identified virus, but do not rule out bacterial infection or co-infection with other pathogens not detected by the test. Clinical correlation with patient history and other diagnostic information is necessary to determine patient infection status. The expected result is Negative.  Fact Sheet for Patients: EntrepreneurPulse.com.au  Fact Sheet for Healthcare Providers: IncredibleEmployment.be  This test is not yet approved or cleared by the [place] FDA and  has been authorized for detection and/or diagnosis of SARS-CoV-2 by FDA under an Emergency Use Authorization (EUA).  This EUA will remain in effect (meaning this test can be used) for the duration of  the COVID-19 declaration under Section 564(b)(1) of the A ct, 21 U.S.C. section 360bbb-3(b)(1), unless the authorization is terminated or revoked sooner.     Influenza A by PCR NEGATIVE NEGATIVE Final    Influenza B by PCR NEGATIVE NEGATIVE Final    Comment: (NOTE) The Xpert Xpress SARS-CoV-2/FLU/RSV plus assay is intended as an aid in the diagnosis of influenza from Nasopharyngeal swab specimens and should not be used as a sole basis for treatment. Nasal washings and aspirates are unacceptable for Xpert Xpress SARS-CoV-2/FLU/RSV testing.  Fact Sheet for Patients: EntrepreneurPulse.com.au  Fact Sheet for Healthcare Providers: IncredibleEmployment.be  This test is not yet approved or cleared by the [place] FDA and has been authorized for detection and/or diagnosis of SARS-CoV-2 by FDA under an Emergency Use Authorization (EUA). This EUA will remain in effect (meaning this test can be used) for the duration of the COVID-19 declaration under Section 564(b)(1) of the Act, 21 U.S.C. section 360bbb-3(b)(1), unless the authorization is terminated or revoked.  Performed at [facility], [address] [place], [place] [address]   Blood culture (routine x 2)     Status: None (Preliminary result)   Collection Time: 02/16/2021  8:18 PM   Specimen: BLOOD  Result Value Ref Range Status   Specimen Description BLOOD BLOOD RIGHT FOREARM  Final   Special Requests   Final    BOTTLES DRAWN AEROBIC AND ANAEROBIC Blood Culture results may not be optimal due to an excessive volume of blood received in culture bottles   Culture   Final    NO GROWTH < 12 HOURS Performed at [facility], [address] [place], [place] [address]    Report Status PENDING  Incomplete  Blood culture (routine x 2)     Status: None (Preliminary result)   Collection Time: 02/20/2021  8:18 PM   Specimen: BLOOD  Result Value Ref Range Status   Specimen Description BLOOD BLOOD RIGHT HAND  Final   Special Requests   Final    BOTTLES DRAWN AEROBIC ONLY Blood Culture results may not be optimal due to an excessive volume of blood received in  culture bottles   Culture    Final    NO GROWTH < 12 HOURS Performed at [facility], [address] [place], [place] [address]    Report Status PENDING  Incomplete  MRSA Next Gen by PCR, Nasal     Status: Abnormal   Collection Time: 02/26/21  4:32 AM   Specimen: Nasal Mucosa; Nasal Swab  Result Value Ref Range Status   MRSA by PCR Next Gen DETECTED (A) NOT DETECTED Final    Comment: RESULT CALLED TO, READ BACK BY AND VERIFIED WITH: KARRIE STEFANOS @0601  02/26/2021 TTG (NOTE) The GeneXpert MRSA Assay (FDA approved for NASAL specimens only), is one component of a comprehensive MRSA colonization surveillance program. It is not intended to diagnose MRSA infection nor to guide or monitor treatment for MRSA infections. Test performance is not FDA approved in patients less than 87 years old. Performed at [facility], [address] [place], [place] [address]   Expectorated Sputum Assessment w Gram Stain, Rflx to Resp Cult     Status: None   Collection Time: 02/26/21  4:37 AM   Specimen: Expectorated Sputum  Result Value Ref Range Status   Specimen Description EXPECTORATED SPUTUM  Final   Special Requests NONE  Final   Sputum evaluation   Final    THIS SPECIMEN IS ACCEPTABLE FOR SPUTUM CULTURE Performed at [facility], [address] [place], [place] [address]    Report Status 02/26/2021 FINAL  Final  Culture, Respiratory w Gram Stain     Status: None (Preliminary result)   Collection Time: 02/26/21  4:37 AM  Result Value Ref Range Status   Specimen Description   Final    EXPECTORATED SPUTUM Performed at [facility], [address] [place], [place] [address]    Special Requests   Final    NONE Reflexed from [id number] Performed at [facility], [place], [place] [address]    Gram Stain   Final    FEW SQUAMOUS EPITHELIAL CELLS PRESENT FEW WBC SEEN RARE GRAM POSITIVE RODS MODERATE GRAM POSITIVE COCCI Performed at [facility],  [place] [address], [place], [place] [address]    Culture PENDING  Incomplete   Report Status PENDING  Incomplete    Lab Basic Metabolic Panel: Recent Labs  Lab 02/17/2021 1750 02/26/21 0432  NA 130* 131*  K 3.2* 3.8  CL 89* 99  CO2 24 19*  GLUCOSE 180* 229*  BUN 55* 52*  CREATININE 8.41* 7.59*  CALCIUM 7.5* 6.2*  MG 1.8 1.6*  PHOS 4.6 5.3*   Liver Function Tests: Recent Labs  Lab 03/05/2021 1750 02/26/21 0432  AST 19 441*  ALT 9 287*  ALKPHOS 74 110  BILITOT 0.9 1.4*  PROT 7.0 5.6*  ALBUMIN 2.2* 2.3*   Recent Labs  Lab 03/05/2021 1750 02/26/21 0230 02/26/21 0432  LIPASE 32  --  28  AMYLASE  --  63  --    No results for input(s): AMMONIA in the last 168 hours. CBC: Recent Labs  Lab 02/24/2021 1750 02/26/21 0432  WBC 15.7* 17.4*  NEUTROABS 9.8*  --   HGB 7.8* 6.5*  HCT 23.9* 20.2*  MCV 96.4 97.1  PLT 272 211   Cardiac Enzymes: No results for input(s): CKTOTAL, CKMB, CKMBINDEX, TROPONINI in the last 168 hours. Sepsis Labs: Recent Labs  Lab 03/03/2021 1750 03/04/2021 1751 02/24/2021 2017 02/26/21 0432 02/26/21 1022  PROCALCITON 0.94  --   --  0.77  --  WBC 15.7*  --   --  17.4*  --   LATICACIDVEN  --  2.5* 2.4* 4.0* 7.2*    Procedures/Operations  02/26/21: CVC 3 L placement   Dewanda Fennema L Rust-[place] 2021/02/28, 2:40 AM  Domingo Pulse [place], AGACNP-BC Acute Care Nurse Practitioner [facility]   [phone] / [phone] Please see Amion for pager details.

## 2021-03-16 DEATH — deceased

## 2021-03-28 ENCOUNTER — Telehealth: Payer: Medicare Other

## 2021-04-26 ENCOUNTER — Ambulatory Visit: Payer: Medicare Other | Admitting: Family Medicine
